# Patient Record
Sex: Male | Born: 1985 | Race: Black or African American | Hispanic: No | Marital: Single | Smoking: Current some day smoker
Health system: Southern US, Community
[De-identification: ages and names within clinical notes are randomized; demographics above are authoritative.]

## PROBLEM LIST (undated history)

## (undated) DIAGNOSIS — E119 Type 2 diabetes mellitus without complications: Secondary | ICD-10-CM

## (undated) DIAGNOSIS — I10 Essential (primary) hypertension: Secondary | ICD-10-CM

## (undated) DIAGNOSIS — E785 Hyperlipidemia, unspecified: Secondary | ICD-10-CM

## (undated) HISTORY — PX: HAND SURGERY: SHX662

## (undated) HISTORY — DX: Type 2 diabetes mellitus without complications: E11.9

## (undated) HISTORY — DX: Essential (primary) hypertension: I10

---

## 2005-08-20 ENCOUNTER — Emergency Department (HOSPITAL_COMMUNITY): Admission: EM | Admit: 2005-08-20 | Discharge: 2005-08-20 | Payer: Self-pay | Admitting: Emergency Medicine

## 2005-08-31 ENCOUNTER — Emergency Department (HOSPITAL_COMMUNITY): Admission: EM | Admit: 2005-08-31 | Discharge: 2005-08-31 | Payer: Self-pay | Admitting: Emergency Medicine

## 2005-09-01 ENCOUNTER — Emergency Department (HOSPITAL_COMMUNITY): Admission: EM | Admit: 2005-09-01 | Discharge: 2005-09-01 | Payer: Self-pay | Admitting: Emergency Medicine

## 2005-12-25 ENCOUNTER — Emergency Department (HOSPITAL_COMMUNITY): Admission: EM | Admit: 2005-12-25 | Discharge: 2005-12-25 | Payer: Self-pay | Admitting: Emergency Medicine

## 2006-02-11 ENCOUNTER — Emergency Department (HOSPITAL_COMMUNITY): Admission: EM | Admit: 2006-02-11 | Discharge: 2006-02-11 | Payer: Self-pay | Admitting: Emergency Medicine

## 2006-02-15 ENCOUNTER — Emergency Department (HOSPITAL_COMMUNITY): Admission: EM | Admit: 2006-02-15 | Discharge: 2006-02-16 | Payer: Self-pay | Admitting: Emergency Medicine

## 2006-02-17 ENCOUNTER — Emergency Department (HOSPITAL_COMMUNITY): Admission: EM | Admit: 2006-02-17 | Discharge: 2006-02-17 | Payer: Self-pay | Admitting: Emergency Medicine

## 2006-02-18 ENCOUNTER — Ambulatory Visit: Payer: Self-pay | Admitting: Orthopedic Surgery

## 2006-02-19 ENCOUNTER — Encounter: Payer: Self-pay | Admitting: Orthopedic Surgery

## 2006-02-19 ENCOUNTER — Ambulatory Visit (HOSPITAL_COMMUNITY): Admission: RE | Admit: 2006-02-19 | Discharge: 2006-02-19 | Payer: Self-pay | Admitting: *Deleted

## 2006-02-20 ENCOUNTER — Emergency Department (HOSPITAL_COMMUNITY): Admission: EM | Admit: 2006-02-20 | Discharge: 2006-02-20 | Payer: Self-pay | Admitting: Emergency Medicine

## 2006-03-10 ENCOUNTER — Ambulatory Visit: Payer: Self-pay | Admitting: Orthopedic Surgery

## 2006-04-02 ENCOUNTER — Ambulatory Visit: Payer: Self-pay | Admitting: Orthopedic Surgery

## 2006-04-07 ENCOUNTER — Encounter (HOSPITAL_COMMUNITY): Admission: RE | Admit: 2006-04-07 | Discharge: 2006-04-26 | Payer: Self-pay | Admitting: Orthopedic Surgery

## 2006-04-29 ENCOUNTER — Encounter (HOSPITAL_COMMUNITY): Admission: RE | Admit: 2006-04-29 | Discharge: 2006-05-29 | Payer: Self-pay | Admitting: Orthopedic Surgery

## 2006-07-22 ENCOUNTER — Emergency Department (HOSPITAL_COMMUNITY): Admission: EM | Admit: 2006-07-22 | Discharge: 2006-07-22 | Payer: Self-pay | Admitting: Emergency Medicine

## 2006-07-27 ENCOUNTER — Emergency Department (HOSPITAL_COMMUNITY): Admission: EM | Admit: 2006-07-27 | Discharge: 2006-07-27 | Payer: Self-pay | Admitting: Emergency Medicine

## 2006-08-04 ENCOUNTER — Ambulatory Visit: Payer: Self-pay | Admitting: Orthopedic Surgery

## 2006-08-04 ENCOUNTER — Emergency Department (HOSPITAL_COMMUNITY): Admission: EM | Admit: 2006-08-04 | Discharge: 2006-08-04 | Payer: Self-pay | Admitting: Emergency Medicine

## 2006-08-13 ENCOUNTER — Emergency Department (HOSPITAL_COMMUNITY): Admission: EM | Admit: 2006-08-13 | Discharge: 2006-08-13 | Payer: Self-pay | Admitting: Emergency Medicine

## 2008-05-04 ENCOUNTER — Emergency Department (HOSPITAL_COMMUNITY): Admission: EM | Admit: 2008-05-04 | Discharge: 2008-05-04 | Payer: Self-pay | Admitting: Internal Medicine

## 2008-06-04 ENCOUNTER — Emergency Department (HOSPITAL_COMMUNITY): Admission: EM | Admit: 2008-06-04 | Discharge: 2008-06-04 | Payer: Self-pay | Admitting: Emergency Medicine

## 2008-06-05 ENCOUNTER — Emergency Department (HOSPITAL_COMMUNITY): Admission: EM | Admit: 2008-06-05 | Discharge: 2008-06-05 | Payer: Self-pay | Admitting: Emergency Medicine

## 2008-06-21 ENCOUNTER — Emergency Department (HOSPITAL_COMMUNITY): Admission: EM | Admit: 2008-06-21 | Discharge: 2008-06-21 | Payer: Self-pay | Admitting: Emergency Medicine

## 2008-06-26 ENCOUNTER — Emergency Department (HOSPITAL_COMMUNITY): Admission: EM | Admit: 2008-06-26 | Discharge: 2008-06-26 | Payer: Self-pay | Admitting: Emergency Medicine

## 2008-08-17 ENCOUNTER — Emergency Department (HOSPITAL_COMMUNITY): Admission: EM | Admit: 2008-08-17 | Discharge: 2008-08-17 | Payer: Self-pay | Admitting: Emergency Medicine

## 2011-04-30 LAB — COMPREHENSIVE METABOLIC PANEL
ALT: 15
Albumin: 4.1
Alkaline Phosphatase: 42
Calcium: 8.9
GFR calc Af Amer: >60
GFR calc non Af Amer: >60
Glucose, Bld: 98
Sodium: 138
Total Bilirubin: 0.6
Total Protein: 6.8

## 2011-04-30 LAB — DIFFERENTIAL
Basophils Absolute: 0
Eosinophils Relative: 1
Lymphocytes Relative: 39
Neutro Abs: 2.6
Neutrophils Relative %: 53

## 2011-04-30 LAB — CBC
HCT: 46.3
MCHC: 33.6
MCV: 89.1
Platelets: 174
RBC: 5.19
WBC: 5

## 2011-04-30 LAB — CK TOTAL AND CKMB (NOT AT ARMC)
CK, MB: 1.3
Relative Index: 0.5
Total CK: 270 — ABNORMAL HIGH

## 2011-04-30 LAB — SALICYLATE LEVEL: Salicylate Lvl: <4

## 2011-04-30 LAB — ETHANOL: Alcohol, Ethyl (B): 189 — ABNORMAL HIGH

## 2011-04-30 LAB — RAPID URINE DRUG SCREEN, HOSP PERFORMED
Barbiturates: NOT DETECTED
Opiates: NOT DETECTED
Tetrahydrocannabinol: NOT DETECTED

## 2011-04-30 LAB — TROPONIN I: Troponin I: 0.01

## 2011-04-30 LAB — ACETAMINOPHEN LEVEL: Acetaminophen (Tylenol), Serum: <10 — ABNORMAL LOW

## 2018-12-15 ENCOUNTER — Emergency Department (HOSPITAL_COMMUNITY)
Admission: EM | Admit: 2018-12-15 | Discharge: 2018-12-15 | Disposition: A | Discharge status: Home / Self Care | Payer: Medicaid Other | Attending: Emergency Medicine | Admitting: Emergency Medicine

## 2018-12-15 ENCOUNTER — Other Ambulatory Visit: Payer: Self-pay

## 2018-12-15 ENCOUNTER — Encounter (HOSPITAL_COMMUNITY): Payer: Self-pay

## 2018-12-15 DIAGNOSIS — Z794 Long term (current) use of insulin: Secondary | ICD-10-CM | POA: Insufficient documentation

## 2018-12-15 DIAGNOSIS — E1165 Type 2 diabetes mellitus with hyperglycemia: Secondary | ICD-10-CM | POA: Insufficient documentation

## 2018-12-15 DIAGNOSIS — Z87891 Personal history of nicotine dependence: Secondary | ICD-10-CM | POA: Insufficient documentation

## 2018-12-15 DIAGNOSIS — R739 Hyperglycemia, unspecified: Secondary | ICD-10-CM

## 2018-12-15 HISTORY — DX: Type 2 diabetes mellitus without complications: E11.9

## 2018-12-15 LAB — CBC
HCT: 51.9 % (ref 39.0–52.0)
Hemoglobin: 16.6 g/dL (ref 13.0–17.0)
MCH: 28.5 pg (ref 26.0–34.0)
MCHC: 32 g/dL (ref 30.0–36.0)
MCV: 89.2 fL (ref 80.0–100.0)
Platelets: 251 10*3/uL (ref 150–400)
RBC: 5.82 MIL/uL — ABNORMAL HIGH (ref 4.22–5.81)
RDW: 14.1 % (ref 11.5–15.5)
WBC: 5 10*3/uL (ref 4.0–10.5)
nRBC: 0 % (ref 0.0–0.2)

## 2018-12-15 LAB — BASIC METABOLIC PANEL
Anion gap: 13 (ref 5–15)
BUN: 15 mg/dL (ref 6–20)
CO2: 24 mmol/L (ref 22–32)
Calcium: 9.2 mg/dL (ref 8.9–10.3)
Chloride: 95 mmol/L — ABNORMAL LOW (ref 98–111)
Creatinine, Ser: 1.34 mg/dL — ABNORMAL HIGH (ref 0.61–1.24)
GFR calc Af Amer: >60 mL/min (ref >60)
GFR calc non Af Amer: >60 mL/min (ref >60)
Glucose, Bld: 323 mg/dL — ABNORMAL HIGH (ref 70–99)
Potassium: 4.4 mmol/L (ref 3.5–5.1)
Sodium: 132 mmol/L — ABNORMAL LOW (ref 135–145)

## 2018-12-15 LAB — URINALYSIS, ROUTINE W REFLEX MICROSCOPIC
Glucose, UA: >=500 mg/dL — AB
Ketones, ur: 80 mg/dL — AB
Leukocytes,Ua: NEGATIVE
Nitrite: NEGATIVE
Protein, ur: NEGATIVE mg/dL
Specific Gravity, Urine: 1.033 — ABNORMAL HIGH (ref 1.005–1.030)
pH: 5 (ref 5.0–8.0)

## 2018-12-15 LAB — CBG MONITORING, ED
Glucose-Capillary: 326 mg/dL — ABNORMAL HIGH (ref 70–99)
Glucose-Capillary: 260 mg/dL — ABNORMAL HIGH (ref 70–99)

## 2018-12-15 MED ORDER — ONDANSETRON HCL 4 MG/2ML IJ SOLN
4.0000 mg | Freq: Once | INTRAMUSCULAR | Status: AC
  Administered 2018-12-15: 4 mg via INTRAVENOUS
  Filled 2018-12-15: qty 2

## 2018-12-15 MED ORDER — INSULIN ASPART 100 UNIT/ML FLEXPEN: 15.0000 [IU] | PEN_INJECTOR | Freq: Three times a day (TID) | SUBCUTANEOUS | 2 refills | Status: DC

## 2018-12-15 MED ORDER — INSULIN GLARGINE 100 UNIT/ML SOLOSTAR PEN: 35.0000 [IU] | PEN_INJECTOR | Freq: Every day | SUBCUTANEOUS | 2 refills | Status: DC

## 2018-12-15 MED ORDER — SODIUM CHLORIDE 0.9 % IV BOLUS (SEPSIS)
1000.0000 mL | Freq: Once | INTRAVENOUS | Status: AC
  Administered 2018-12-15: 1000 mL via INTRAVENOUS

## 2018-12-15 MED ORDER — INSULIN ASPART 100 UNIT/ML ~~LOC~~ SOLN
10.0000 [IU] | Freq: Once | SUBCUTANEOUS | Status: AC
  Administered 2018-12-15: 10 [IU] via SUBCUTANEOUS
  Filled 2018-12-15: qty 1

## 2018-12-15 MED ORDER — SODIUM CHLORIDE 0.9 % IV SOLN: 1000.0000 mL | INTRAVENOUS | Status: DC

## 2018-12-15 NOTE — Discharge Instructions (Signed)
Social work will call you tomorrow.

## 2018-12-15 NOTE — ED Provider Notes (Signed)
[facility] EMERGENCY DEPARTMENT Provider Note   CSN: [id number] Arrival date & time: 12/15/18  1910    History   Chief Complaint Chief Complaint  Patient presents with  . Hyperglycemia    HPI [name] [name] is a 33 y.o. male.     The history is provided by the patient. No language interpreter was used.  Hyperglycemia  Blood sugar level PTA:  500 Severity:  Severe Onset quality:  Gradual Timing:  Constant Progression:  Worsening Chronicity:  New Diabetes status:  Controlled with insulin Relieved by:  None tried Ineffective treatments:  Insulin Associated symptoms: nausea   Risk factors: hx of DKA     Past Medical History:  Diagnosis Date  . Diabetes mellitus without complication (HCC)     There are no active problems to display for this patient.   Past Surgical History:  Procedure Laterality Date  . HAND SURGERY          Home Medications    Prior to Admission medications   Medication Sig Start Date End Date Taking? Authorizing Provider  insulin aspart (NOVOLOG) 100 UNIT/ML FlexPen Inject 15-25 Units into the skin 3 (three) times daily. 25 units for blood sugar levels over 200. 15 units for most doses   Yes [provider]  Insulin Glargine (LANTUS SOLOSTAR) 100 UNIT/ML Solostar Pen Inject 35 Units into the skin at bedtime.   Yes [provider]    Family History No family history on file.  Social History Social History   Tobacco Use  . Smoking status: Former Smoker    Packs/day: 0.50    Years: 10.00    Pack years: 5.00    Types: Cigarettes    Last attempt to quit: 10/13/2018    Years since quitting: 0.1  . Smokeless tobacco: Former Engineer, water Use Topics  . Alcohol use: Not on file    Comment: quit  . Drug use: Never     Allergies   Patient has no known allergies.   Review of Systems Review of Systems  Gastrointestinal: Positive for nausea.  All other systems reviewed and are negative.    Physical Exam  Updated Vital Signs BP (!) 148/98 (BP Location: Right Arm)   Pulse (!) 110   Temp 98.2 F (36.8 C) (Oral)   Resp 20   Ht 5\' 11"  (1.803 m)   Wt 127 kg   SpO2 100%   BMI 39.05 kg/m   Physical Exam Vitals signs and nursing note reviewed.  Constitutional:      Appearance: He is well-developed.  HENT:     Head: Normocephalic and atraumatic.     Mouth/Throat:     Mouth: Mucous membranes are moist.  Eyes:     Conjunctiva/sclera: Conjunctivae normal.  Neck:     Musculoskeletal: Neck supple.  Cardiovascular:     Rate and Rhythm: Normal rate and regular rhythm.     Heart sounds: No murmur.  Pulmonary:     Effort: Pulmonary effort is normal. No respiratory distress.     Breath sounds: Normal breath sounds.  Abdominal:     General: Abdomen is flat.     Palpations: Abdomen is soft.     Tenderness: There is no abdominal tenderness.  Skin:    General: Skin is warm and dry.  Neurological:     General: No focal deficit present.     Mental Status: He is alert.  Psychiatric:        Mood and Affect: Mood normal.  ED Treatments / Results  Labs (all labs ordered are listed, but only abnormal results are displayed) Labs Reviewed  BASIC METABOLIC PANEL - Abnormal; Notable for the following components:      Result Value   Sodium 132 (*)    Chloride 95 (*)    Glucose, Bld 323 (*)    Creatinine, Ser 1.34 (*)    All other components within normal limits  CBC - Abnormal; Notable for the following components:   RBC 5.82 (*)    All other components within normal limits  URINALYSIS, ROUTINE W REFLEX MICROSCOPIC - Abnormal; Notable for the following components:   Color, Urine AMBER (*)    Specific Gravity, Urine 1.033 (*)    Glucose, UA >=500 (*)    Hgb urine dipstick SMALL (*)    Bilirubin Urine SMALL (*)    Ketones, ur 80 (*)    Bacteria, UA RARE (*)    All other components within normal limits  CBG MONITORING, ED - Abnormal; Notable for the following components:    Glucose-Capillary 326 (*)    All other components within normal limits  CBG MONITORING, ED - Abnormal; Notable for the following components:   Glucose-Capillary 260 (*)    All other components within normal limits  CBG MONITORING, ED    EKG None  Radiology No results found.  Procedures Procedures (including critical care time)  Medications Ordered in ED Medications  sodium chloride 0.9 % bolus 1,000 mL (1,000 mLs Intravenous New Bag/Given 12/15/18 2112)    Followed by  sodium chloride 0.9 % bolus 1,000 mL (1,000 mLs Intravenous New Bag/Given 12/15/18 2150)    Followed by  0.9 %  sodium chloride infusion (has no administration in time range)  insulin aspart (novoLOG) injection 10 Units (10 Units Subcutaneous Given 12/15/18 2149)     Initial Impression / Assessment and Plan / ED Course  I have reviewed the triage vital signs and the nursing notes.  Pertinent labs & imaging results that were available during my care of the patient were reviewed by me and considered in my medical decision making (see chart for details).        MDM  Pt given iv fluids x 2 liters.  Insulin 10 units subq.   Labs reviewed.  No sign of dka.   Pt states he has no diabetic supplies and no insulin.  Social work consult ordered.    Final Clinical Impressions(s) / ED Diagnoses   Final diagnoses:  Hyperglycemia    ED Discharge Orders         Ordered    insulin aspart (NOVOLOG) 100 UNIT/ML FlexPen  3 times daily     12/15/18 2317    Insulin Glargine (LANTUS SOLOSTAR) 100 UNIT/ML Solostar Pen  Daily at bedtime     12/15/18 2317        An After Visit Summary was printed and given to the patient.    Elson AreasSofia, Kaeden Mester K, PA-C 12/15/18 2317    Mancel BaleWentz, Elliott, MD 12/16/18 [phone]

## 2018-12-15 NOTE — ED Triage Notes (Addendum)
Pt reports vomiting x 3days. Pt reports frequent urination. Pt is diabetic and has no insurance right now. Pt has been using fast acting insulin only as he cannot obtain his long acting insulin (pt says he just moved down here). Pt CBG is 326 in triage. Pt moved here  from [place] two weeks ago- reports he had the covid-19 test and was negative.

## 2018-12-15 NOTE — ED Notes (Signed)
Pt states that social work needs to call [phone] tomorrow. Pt states he does not have a working phone.

## 2018-12-16 NOTE — Care Management (Addendum)
CM received call from pt regarding medication assistance. Pt given MATCH voucher and referred to [facility]. He had medicaid in [place], recently moved to [facility] and no longer has coverage.   Update: received call from pt's mom. [facility] did not carry prescribed insulin. CM called [employer] and they will request Rx from [facility] and call pt once ready for pick up.

## 2018-12-17 NOTE — Care Management (Addendum)
12/17/18:  Called BIN # and MRN number to [place] for [employer].   Patient was prescribed Lantus and Novolog pens which has to filled at a [facility]. Call to [facility], [facility] voucher info given along with patient info. They will call [facility] and have prescriptions sent to them. They will call patient when ready today.   CM called patient, no answer, VM full, unable to leave message. Left VM with mother (home phone listed on chart).

## 2018-12-22 ENCOUNTER — Emergency Department (HOSPITAL_COMMUNITY)
Admission: EM | Admit: 2018-12-22 | Discharge: 2018-12-22 | Discharge status: Absent without leave | Payer: Medicaid Other

## 2018-12-22 ENCOUNTER — Other Ambulatory Visit: Payer: Self-pay

## 2018-12-22 MED FILL — NOVOLOG FLEXPEN SYRINGE: 100 | 25 days supply | Qty: 15 | Fill #0

## 2019-05-09 ENCOUNTER — Encounter (HOSPITAL_COMMUNITY): Payer: Self-pay | Admitting: Emergency Medicine

## 2019-05-09 ENCOUNTER — Emergency Department (HOSPITAL_COMMUNITY)
Admission: EM | Admit: 2019-05-09 | Discharge: 2019-05-09 | Disposition: A | Discharge status: Home / Self Care | Payer: Self-pay | Attending: Emergency Medicine | Admitting: Emergency Medicine

## 2019-05-09 ENCOUNTER — Other Ambulatory Visit: Payer: Self-pay

## 2019-05-09 DIAGNOSIS — Z5321 Procedure and treatment not carried out due to patient leaving prior to being seen by health care provider: Secondary | ICD-10-CM | POA: Insufficient documentation

## 2019-05-09 DIAGNOSIS — R739 Hyperglycemia, unspecified: Secondary | ICD-10-CM | POA: Insufficient documentation

## 2019-05-09 DIAGNOSIS — Z794 Long term (current) use of insulin: Secondary | ICD-10-CM | POA: Insufficient documentation

## 2019-05-09 DIAGNOSIS — E1165 Type 2 diabetes mellitus with hyperglycemia: Secondary | ICD-10-CM | POA: Insufficient documentation

## 2019-05-09 DIAGNOSIS — F17210 Nicotine dependence, cigarettes, uncomplicated: Secondary | ICD-10-CM | POA: Insufficient documentation

## 2019-05-09 LAB — CBG MONITORING, ED: Glucose-Capillary: 353 mg/dL — ABNORMAL HIGH (ref 70–99)

## 2019-05-09 NOTE — ED Notes (Signed)
Pt reports he is leaving AMA  States he can adjust sugar at home "if it doesn't come down"

## 2019-05-09 NOTE — ED Triage Notes (Signed)
Pt states he just got out of jail and has been without his medications for quite some time. Feels his glucose has been high.

## 2019-05-10 ENCOUNTER — Emergency Department (HOSPITAL_COMMUNITY)
Admission: EM | Admit: 2019-05-10 | Discharge: 2019-05-10 | Disposition: A | Discharge status: Home / Self Care | Payer: Self-pay | Attending: Emergency Medicine | Admitting: Emergency Medicine

## 2019-05-10 ENCOUNTER — Encounter (HOSPITAL_COMMUNITY): Payer: Self-pay | Admitting: *Deleted

## 2019-05-10 DIAGNOSIS — R739 Hyperglycemia, unspecified: Secondary | ICD-10-CM

## 2019-05-10 LAB — BASIC METABOLIC PANEL
Anion gap: 11 (ref 5–15)
BUN: 11 mg/dL (ref 6–20)
CO2: 25 mmol/L (ref 22–32)
Calcium: 8.8 mg/dL — ABNORMAL LOW (ref 8.9–10.3)
Chloride: 94 mmol/L — ABNORMAL LOW (ref 98–111)
Creatinine, Ser: 1.24 mg/dL (ref 0.61–1.24)
GFR calc Af Amer: >60 mL/min (ref >60)
GFR calc non Af Amer: >60 mL/min (ref >60)
Glucose, Bld: 592 mg/dL (ref 70–99)
Potassium: 4.4 mmol/L (ref 3.5–5.1)
Sodium: 130 mmol/L — ABNORMAL LOW (ref 135–145)

## 2019-05-10 LAB — URINALYSIS, ROUTINE W REFLEX MICROSCOPIC
Bacteria, UA: NONE SEEN
Bilirubin Urine: NEGATIVE
Glucose, UA: >=500 mg/dL — AB
Hgb urine dipstick: NEGATIVE
Ketones, ur: NEGATIVE mg/dL
Leukocytes,Ua: NEGATIVE
Nitrite: NEGATIVE
Protein, ur: NEGATIVE mg/dL
Specific Gravity, Urine: 1.023 (ref 1.005–1.030)
pH: 6 (ref 5.0–8.0)

## 2019-05-10 LAB — CBC
HCT: 46.1 % (ref 39.0–52.0)
Hemoglobin: 15.2 g/dL (ref 13.0–17.0)
MCH: 29.1 pg (ref 26.0–34.0)
MCHC: 33 g/dL (ref 30.0–36.0)
MCV: 88.3 fL (ref 80.0–100.0)
Platelets: 162 10*3/uL (ref 150–400)
RBC: 5.22 MIL/uL (ref 4.22–5.81)
RDW: 12 % (ref 11.5–15.5)
WBC: 3.3 10*3/uL — ABNORMAL LOW (ref 4.0–10.5)
nRBC: 0 % (ref 0.0–0.2)

## 2019-05-10 LAB — CBG MONITORING, ED
Glucose-Capillary: 288 mg/dL — ABNORMAL HIGH (ref 70–99)
Glucose-Capillary: 416 mg/dL — ABNORMAL HIGH (ref 70–99)
Glucose-Capillary: 457 mg/dL — ABNORMAL HIGH (ref 70–99)

## 2019-05-10 MED ORDER — SODIUM CHLORIDE 0.9 % IV BOLUS
1000.0000 mL | Freq: Once | INTRAVENOUS | Status: AC
  Administered 2019-05-10: 1000 mL via INTRAVENOUS

## 2019-05-10 MED ORDER — SODIUM CHLORIDE 0.9 % IV BOLUS
1000.0000 mL | Freq: Once | INTRAVENOUS | Status: AC
  Administered 2019-05-10: 03:00:00 1000 mL via INTRAVENOUS

## 2019-05-10 MED ORDER — INSULIN ASPART 100 UNIT/ML FLEXPEN: 15.0000 [IU] | PEN_INJECTOR | Freq: Three times a day (TID) | SUBCUTANEOUS | 2 refills | Status: DC

## 2019-05-10 MED ORDER — INSULIN ASPART 100 UNIT/ML ~~LOC~~ SOLN
4.0000 [IU] | Freq: Once | SUBCUTANEOUS | Status: AC
  Administered 2019-05-10: 04:00:00 4 [IU] via INTRAVENOUS
  Filled 2019-05-10: qty 1

## 2019-05-10 MED ORDER — INSULIN ASPART 100 UNIT/ML ~~LOC~~ SOLN
7.0000 [IU] | Freq: Once | SUBCUTANEOUS | Status: AC
  Administered 2019-05-10: 07:00:00 7 [IU] via INTRAVENOUS
  Filled 2019-05-10: qty 1

## 2019-05-10 MED ORDER — LANTUS SOLOSTAR 100 UNIT/ML ~~LOC~~ SOPN: 35.0000 [IU] | PEN_INJECTOR | Freq: Every day | SUBCUTANEOUS | 2 refills | Status: DC

## 2019-05-10 NOTE — Discharge Instructions (Signed)
You need to find a local doctor to help you keep up with your insulin.  Please look at the diabetic diet and try to follow it.

## 2019-05-10 NOTE — ED Notes (Signed)
Date and time results received: 05/10/19 4:22 AM (use smartphrase ".now" to insert current time)  Test: glucose Critical Value: [place]  Name of Provider Notified: Dr Tomi Bamberger  Orders Received? Or Actions Taken?: see chart

## 2019-05-10 NOTE — ED Provider Notes (Signed)
[facility] EMERGENCY DEPARTMENT Provider Note   CSN: [id number] Arrival date & time: 05/09/19  2344   Time seen 2:55 AM History   Chief Complaint Chief Complaint  Patient presents with  . Hyperglycemia    HPI [name] [name] is a 33 y.o. male.     HPI patient states he has has had diabetes since he was 4 and he has always been on insulin.  He states he moved here from [place] about 7 months ago and has never gotten a Environmental manager.  He states he also was in jail and was getting insulin while in jail however he was released 4 weeks ago.  He states as soon as he was released he started having abdominal pain for the first 2 weeks he got out however the pain is been gone for 1 to 2 weeks.  He had chest pain a few days ago.  He denies shortness of breath or weight loss.  He states he has had nausea and vomiting about 3 times a day for the past few weeks and diarrhea about 3 times a day for about a week.  He states he was on a NovoLog pen and took 35 units once a day and then Lantus 25 units with each meal.  He states when he was in jail they gave him insulin 70/30 and he states it did not control his diabetes well.  PCP Patient, No Pcp Per   Past Medical History:  Diagnosis Date  . Diabetes mellitus without complication ([place])     There are no active problems to display for this patient.   Past Surgical History:  Procedure Laterality Date  . HAND SURGERY          Home Medications    Prior to Admission medications   Medication Sig Start Date End Date Taking? Authorizing Provider  insulin aspart (NOVOLOG) 100 UNIT/ML FlexPen Inject 15-25 Units into the skin 3 (three) times daily. 25 units for blood sugar levels over 200. 15 units for most doses 05/10/19   Rolland Porter, MD  Insulin Glargine (LANTUS SOLOSTAR) 100 UNIT/ML Solostar Pen Inject 35 Units into the skin at bedtime. 05/10/19   Rolland Porter, MD    Family History No family history on file.  Social History Social  History   Tobacco Use  . Smoking status: Current Every Day Smoker    Packs/day: 0.50    Years: 10.00    Pack years: 5.00    Types: Cigarettes  . Smokeless tobacco: Former Network engineer Use Topics  . Alcohol use: Yes  . Drug use: Never  unemployed   Allergies   Patient has no known allergies.   Review of Systems Review of Systems  All other systems reviewed and are negative.    Physical Exam Updated Vital Signs BP (!) 147/98   Pulse 70   Temp 97.6 F (36.4 C) (Oral)   Resp 18   Ht 6' (1.829 m)   Wt 128 kg   SpO2 100%   BMI 38.27 kg/m   Physical Exam Vitals signs and nursing note reviewed.  Constitutional:      Appearance: Normal appearance.  HENT:     Head: Normocephalic and atraumatic.     Right Ear: External ear normal.     Left Ear: External ear normal.     Mouth/Throat:     Mouth: Mucous membranes are dry.     Pharynx: No oropharyngeal exudate or posterior oropharyngeal erythema.  Eyes:  Extraocular Movements: Extraocular movements intact.     Conjunctiva/sclera: Conjunctivae normal.  Neck:     Musculoskeletal: Normal range of motion.  Cardiovascular:     Rate and Rhythm: Normal rate and regular rhythm.     Pulses: Normal pulses.  Pulmonary:     Effort: Pulmonary effort is normal.     Breath sounds: Normal breath sounds. No wheezing.  Abdominal:     General: Abdomen is flat.     Palpations: Abdomen is soft.     Tenderness: There is no abdominal tenderness.  Musculoskeletal: Normal range of motion.  Skin:    General: Skin is warm and dry.  Neurological:     General: No focal deficit present.     Mental Status: He is alert and oriented to person, place, and time.     Cranial Nerves: No cranial nerve deficit.  Psychiatric:        Mood and Affect: Mood normal.        Behavior: Behavior normal.        Thought Content: Thought content normal.      ED Treatments / Results  Labs (all labs ordered are listed, but only abnormal results  are displayed) Results for orders placed or performed during the hospital encounter of 05/10/19  Basic metabolic panel  Result Value Ref Range   Sodium 130 (L) 135 - 145 mmol/L   Potassium 4.4 3.5 - 5.1 mmol/L   Chloride 94 (L) 98 - 111 mmol/L   CO2 25 22 - 32 mmol/L   Glucose, Bld 592 (HH) 70 - 99 mg/dL   BUN 11 6 - 20 mg/dL   Creatinine, Ser 1.76 0.61 - 1.24 mg/dL   Calcium 8.8 (L) 8.9 - 10.3 mg/dL   GFR calc non Af Amer >60 >60 mL/min   GFR calc Af Amer >60 >60 mL/min   Anion gap 11 5 - 15  CBC  Result Value Ref Range   WBC 3.3 (L) 4.0 - 10.5 K/uL   RBC 5.22 4.22 - 5.81 MIL/uL   Hemoglobin 15.2 13.0 - 17.0 g/dL   HCT 16.0 73.7 - 10.6 %   MCV 88.3 80.0 - 100.0 fL   MCH 29.1 26.0 - 34.0 pg   MCHC 33.0 30.0 - 36.0 g/dL   RDW 26.9 48.5 - 46.2 %   Platelets 162 150 - 400 K/uL   nRBC 0.0 0.0 - 0.2 %  Urinalysis, Routine w reflex microscopic  Result Value Ref Range   Color, Urine STRAW (A) YELLOW   APPearance CLEAR CLEAR   Specific Gravity, Urine 1.023 1.005 - 1.030   pH 6.0 5.0 - 8.0   Glucose, UA >=500 (A) NEGATIVE mg/dL   Hgb urine dipstick NEGATIVE NEGATIVE   Bilirubin Urine NEGATIVE NEGATIVE   Ketones, ur NEGATIVE NEGATIVE mg/dL   Protein, ur NEGATIVE NEGATIVE mg/dL   Nitrite NEGATIVE NEGATIVE   Leukocytes,Ua NEGATIVE NEGATIVE   RBC / HPF 0-5 0 - 5 RBC/hpf   WBC, UA 0-5 0 - 5 WBC/hpf   Bacteria, UA NONE SEEN NONE SEEN   Squamous Epithelial / LPF 0-5 0 - 5  CBG monitoring, ED  Result Value Ref Range   Glucose-Capillary 457 (H) 70 - 99 mg/dL  CBG monitoring, ED  Result Value Ref Range   Glucose-Capillary 416 (H) 70 - 99 mg/dL  CBG monitoring, ED  Result Value Ref Range   Glucose-Capillary 288 (H) 70 - 99 mg/dL   Laboratory interpretation all normal except hyperglycemia without metabolic acidosis  EKG None  Radiology No results found.  Procedures Procedures (including critical care time)  Medications Ordered in ED Medications  sodium chloride 0.9 %  bolus 1,000 mL (0 mLs Intravenous Stopped 05/10/19 0437)  sodium chloride 0.9 % bolus 1,000 mL (0 mLs Intravenous Stopped 05/10/19 0437)  insulin aspart (novoLOG) injection 4 Units (4 Units Intravenous Given 05/10/19 0353)  insulin aspart (novoLOG) injection 7 Units (7 Units Intravenous Given 05/10/19 [id number])     Initial Impression / Assessment and Plan / ED Course  I have reviewed the triage vital signs and the nursing notes.  Pertinent labs & imaging results that were available during my care of the patient were reviewed by me and considered in my medical decision making (see chart for details).    Patient was given IV fluids, he was given insulin 4 units NovoLog IV.  When his CBG was rechecked after the IV fluids and insulin it was still 416.  He was given an additional 7 units of insulin.   7:55 AM patient's CBG is down to 288.  He was discharged home.  He was given information on to get a local doctor, social services to help him with his financial situation.   Final Clinical Impressions(s) / ED Diagnoses   Final diagnoses:  Hyperglycemia    ED Discharge Orders         Ordered    insulin aspart (NOVOLOG) 100 UNIT/ML FlexPen  3 times daily     05/10/19 0718    Insulin Glargine (LANTUS SOLOSTAR) 100 UNIT/ML Solostar Pen  Daily at bedtime     05/10/19 [id number]          Plan discharge  Devoria AlbeIva Kellyjo Edgren, MD, Concha PyoFACEP    Tredarius Cobern, MD 05/10/19 [phone]

## 2019-05-10 NOTE — ED Triage Notes (Signed)
Pt c/o elevated blood sugar, states " I have been feeling bad"

## 2019-05-29 ENCOUNTER — Other Ambulatory Visit: Payer: Self-pay

## 2019-05-29 ENCOUNTER — Encounter (HOSPITAL_COMMUNITY): Payer: Self-pay | Admitting: Emergency Medicine

## 2019-05-29 DIAGNOSIS — Z794 Long term (current) use of insulin: Secondary | ICD-10-CM | POA: Insufficient documentation

## 2019-05-29 DIAGNOSIS — R319 Hematuria, unspecified: Secondary | ICD-10-CM | POA: Insufficient documentation

## 2019-05-29 DIAGNOSIS — E119 Type 2 diabetes mellitus without complications: Secondary | ICD-10-CM | POA: Insufficient documentation

## 2019-05-29 DIAGNOSIS — F17210 Nicotine dependence, cigarettes, uncomplicated: Secondary | ICD-10-CM | POA: Insufficient documentation

## 2019-05-29 NOTE — ED Triage Notes (Signed)
Pt c/o hematuria and back pain that started 5 days ago.

## 2019-05-30 ENCOUNTER — Emergency Department (HOSPITAL_COMMUNITY): Payer: Self-pay

## 2019-05-30 ENCOUNTER — Emergency Department (HOSPITAL_COMMUNITY)
Admission: EM | Admit: 2019-05-30 | Discharge: 2019-05-30 | Disposition: A | Discharge status: Home / Self Care | Payer: Self-pay | Attending: Emergency Medicine | Admitting: Emergency Medicine

## 2019-05-30 DIAGNOSIS — R319 Hematuria, unspecified: Secondary | ICD-10-CM

## 2019-05-30 LAB — URINALYSIS, ROUTINE W REFLEX MICROSCOPIC
Bacteria, UA: NONE SEEN
Bilirubin Urine: NEGATIVE
Glucose, UA: >=500 mg/dL — AB
Hgb urine dipstick: NEGATIVE
Ketones, ur: NEGATIVE mg/dL
Leukocytes,Ua: NEGATIVE
Nitrite: NEGATIVE
Protein, ur: 30 mg/dL — AB
Specific Gravity, Urine: 1.027 (ref 1.005–1.030)
pH: 5 (ref 5.0–8.0)

## 2019-05-30 LAB — CBG MONITORING, ED: Glucose-Capillary: 220 mg/dL — ABNORMAL HIGH (ref 70–99)

## 2019-05-30 MED ORDER — KETOROLAC TROMETHAMINE 30 MG/ML IJ SOLN
30.0000 mg | Freq: Once | INTRAMUSCULAR | Status: AC
  Administered 2019-05-30: 02:00:00 30 mg via INTRAMUSCULAR

## 2019-05-30 MED ORDER — KETOROLAC TROMETHAMINE 30 MG/ML IJ SOLN
INTRAMUSCULAR | Status: AC
  Administered 2019-05-30: 30 mg via INTRAMUSCULAR
  Filled 2019-05-30: qty 1

## 2019-05-30 NOTE — ED Provider Notes (Signed)
[facility] EMERGENCY DEPARTMENT Provider Note   CSN: [id number] Arrival date & time: 05/29/19  2215   Time seen 1:22 AM History   Chief Complaint Chief Complaint  Patient presents with   Hematuria    HPI [name] [name] is a 33 y.o. male.     HPI patient states he has had blood in his urine for the past week.  He also complains of his whole lumbar spine is being painful in the midline for the past 4 to 5 days.  He states is constant and it hurts so bad he cannot fall asleep.  But he also told me it goes away at night.  He denies nausea, vomiting, fever, but he does describe dysuria, frequency, and urgency.  He states he is never had this before.  PCP Patient, No Pcp Per   Past Medical History:  Diagnosis Date   Diabetes mellitus without complication (HCC)     There are no active problems to display for this patient.   Past Surgical History:  Procedure Laterality Date   HAND SURGERY          Home Medications    Prior to Admission medications   Medication Sig Start Date End Date Taking? Authorizing Provider  insulin aspart (NOVOLOG) 100 UNIT/ML FlexPen Inject 15-25 Units into the skin 3 (three) times daily. 25 units for blood sugar levels over 200. 15 units for most doses 05/10/19   Devoria AlbeKnapp, Malea Swilling, MD  Insulin Glargine (LANTUS SOLOSTAR) 100 UNIT/ML Solostar Pen Inject 35 Units into the skin at bedtime. 05/10/19   Devoria AlbeKnapp, Juanpablo Ciresi, MD    Family History No family history on file.  Social History Social History   Tobacco Use   Smoking status: Current Every Day Smoker    Packs/day: 0.50    Years: 10.00    Pack years: 5.00    Types: Cigarettes   Smokeless tobacco: Former NeurosurgeonUser  Substance Use Topics   Alcohol use: Yes   Drug use: Never  [place]   Allergies   Patient has no known allergies.   Review of Systems Review of Systems  All other systems reviewed and are negative.    Physical Exam Updated Vital Signs BP (!)  162/114    Pulse 89    Temp 98.1 F (36.7 C)    Resp 18    Ht 5\' 11"  (1.803 m)    Wt 128 kg    SpO2 100%    BMI 39.36 kg/m   Physical Exam Vitals signs and nursing note reviewed.  Constitutional:      General: He is not in acute distress.    Appearance: Normal appearance. He is obese.     Comments: Patient is sound asleep and had to be awakened.  HENT:     Head: Normocephalic and atraumatic.     Right Ear: External ear normal.     Left Ear: External ear normal.  Eyes:     Extraocular Movements: Extraocular movements intact.     Conjunctiva/sclera: Conjunctivae normal.  Neck:     Musculoskeletal: Normal range of motion.  Cardiovascular:     Rate and Rhythm: Normal rate.  Pulmonary:     Effort: Pulmonary effort is normal. No respiratory distress.  Abdominal:     General: Abdomen is flat. Bowel sounds are normal.     Palpations: Abdomen is soft.     Tenderness: There is abdominal tenderness in the suprapubic area. There is no right CVA tenderness, left CVA  tenderness, guarding or rebound.  Musculoskeletal: Normal range of motion.     Comments: Patient states his tenderness is over his lumbar spine in the midline, I do not feel any step-offs or crepitus.  Skin:    General: Skin is warm and dry.  Neurological:     General: No focal deficit present.     Mental Status: He is alert and oriented to person, place, and time.     Cranial Nerves: No cranial nerve deficit.  Psychiatric:        Mood and Affect: Mood normal.        Behavior: Behavior normal.        Thought Content: Thought content normal.      ED Treatments / Results  Labs (all labs ordered are listed, but only abnormal results are displayed) Results for orders placed or performed during the hospital encounter of 05/30/19  Urinalysis, Routine w reflex microscopic- may I&O cath if menses  Result Value Ref Range   Color, Urine YELLOW YELLOW   APPearance HAZY (A) CLEAR   Specific Gravity, Urine 1.027 1.005 - 1.030    pH 5.0 5.0 - 8.0   Glucose, UA >=500 (A) NEGATIVE mg/dL   Hgb urine dipstick NEGATIVE NEGATIVE   Bilirubin Urine NEGATIVE NEGATIVE   Ketones, ur NEGATIVE NEGATIVE mg/dL   Protein, ur 30 (A) NEGATIVE mg/dL   Nitrite NEGATIVE NEGATIVE   Leukocytes,Ua NEGATIVE NEGATIVE   RBC / HPF 0-5 0 - 5 RBC/hpf   WBC, UA 0-5 0 - 5 WBC/hpf   Bacteria, UA NONE SEEN NONE SEEN   Squamous Epithelial / LPF 0-5 0 - 5   Mucus PRESENT   CBG monitoring, ED  Result Value Ref Range   Glucose-Capillary 220 (H) 70 - 99 mg/dL   Laboratory interpretation all normal except hyperglycemia   EKG None  Radiology Ct Renal Stone Study  Result Date: 05/30/2019 CLINICAL DATA:  33 year old male with hematuria. EXAM: CT ABDOMEN AND PELVIS WITHOUT CONTRAST TECHNIQUE: Multidetector CT imaging of the abdomen and pelvis was performed following the standard protocol without IV contrast. COMPARISON:  None. FINDINGS: Evaluation of this exam is limited in the absence of intravenous contrast. Lower chest: The visualized lung bases are clear. No intra-abdominal free air or free fluid. Hepatobiliary: The liver is unremarkable. No intrahepatic biliary ductal dilatation. Layering sludge noted within the gallbladder. No pericholecystic fluid. Pancreas: Unremarkable. No pancreatic ductal dilatation or surrounding inflammatory changes. Spleen: Normal in size without focal abnormality. Adrenals/Urinary Tract: The adrenal glands are unremarkable. The kidneys, visualized ureters, and urinary bladder appear unremarkable. Stomach/Bowel: There is no bowel obstruction or active inflammation. The appendix is normal. Vascular/Lymphatic: The abdominal aorta and IVC are grossly unremarkable on this noncontrast CT. No portal venous gas. There is no adenopathy. Reproductive: Prostate and seminal vesicles are grossly unremarkable. No pelvic mass. Other: Small fat containing umbilical hernia. Musculoskeletal: No acute or significant osseous findings. IMPRESSION:  1. No acute intra-abdominal or pelvic pathology. No hydronephrosis or nephrolithiasis. 2. No bowel obstruction or active inflammation. Normal appendix. Electronically Signed   By: Anner Crete M.D.   On: 05/30/2019 02:55    Procedures Procedures (including critical care time)  Medications Ordered in ED Medications  ketorolac (TORADOL) 30 MG/ML injection 30 mg (30 mg Intramuscular Given During Downtime 05/30/19 0223)     Initial Impression / Assessment and Plan / ED Course  I have reviewed the triage vital signs and the nursing notes.  Pertinent labs & imaging results that were available  during my care of the patient were reviewed by me and considered in my medical decision making (see chart for details).      Patient had told the nurse he thought his blood sugar was dropping, his CBG was done and it was 220.  Patient was given Toradol for his pain.  Patient CT is normal and he was discharged home.  Final Clinical Impressions(s) / ED Diagnoses   Final diagnoses:  Hematuria, unspecified type    ED Discharge Orders    None     Plan discharge  Devoria Albe, MD, Concha Pyo, MD 05/30/19 [phone]

## 2019-05-30 NOTE — ED Notes (Signed)
Pt going to restroom

## 2019-05-30 NOTE — Discharge Instructions (Addendum)
Your urinalysis that went to the lab tonight did not show any blood.  Your CT scan does not show any kidney stones.

## 2019-06-29 ENCOUNTER — Emergency Department (HOSPITAL_COMMUNITY): Payer: Self-pay

## 2019-06-29 ENCOUNTER — Encounter (HOSPITAL_COMMUNITY): Payer: Self-pay | Admitting: Emergency Medicine

## 2019-06-29 ENCOUNTER — Other Ambulatory Visit: Payer: Self-pay

## 2019-06-29 ENCOUNTER — Emergency Department (HOSPITAL_COMMUNITY)
Admission: EM | Admit: 2019-06-29 | Discharge: 2019-06-29 | Disposition: A | Discharge status: Home / Self Care | Payer: Self-pay | Attending: Emergency Medicine | Admitting: Emergency Medicine

## 2019-06-29 DIAGNOSIS — M545 Low back pain, unspecified: Secondary | ICD-10-CM

## 2019-06-29 DIAGNOSIS — E119 Type 2 diabetes mellitus without complications: Secondary | ICD-10-CM | POA: Insufficient documentation

## 2019-06-29 DIAGNOSIS — E669 Obesity, unspecified: Secondary | ICD-10-CM | POA: Insufficient documentation

## 2019-06-29 DIAGNOSIS — Z6836 Body mass index (BMI) 36.0-36.9, adult: Secondary | ICD-10-CM | POA: Insufficient documentation

## 2019-06-29 DIAGNOSIS — Z794 Long term (current) use of insulin: Secondary | ICD-10-CM | POA: Insufficient documentation

## 2019-06-29 DIAGNOSIS — F17210 Nicotine dependence, cigarettes, uncomplicated: Secondary | ICD-10-CM | POA: Insufficient documentation

## 2019-06-29 LAB — URINALYSIS, ROUTINE W REFLEX MICROSCOPIC
Bacteria, UA: NONE SEEN
Bilirubin Urine: NEGATIVE
Glucose, UA: >=500 mg/dL — AB
Hgb urine dipstick: NEGATIVE
Ketones, ur: NEGATIVE mg/dL
Leukocytes,Ua: NEGATIVE
Nitrite: NEGATIVE
Protein, ur: 30 mg/dL — AB
Specific Gravity, Urine: 1.028 (ref 1.005–1.030)
pH: 5 (ref 5.0–8.0)

## 2019-06-29 MED ORDER — LIDOCAINE 5 % EX PTCH
1.0000 | MEDICATED_PATCH | CUTANEOUS | Status: DC
  Administered 2019-06-29: 1 via TRANSDERMAL
  Filled 2019-06-29 (×2): qty 1

## 2019-06-29 MED ORDER — OXYCODONE-ACETAMINOPHEN 5-325 MG PO TABS
1.0000 | ORAL_TABLET | Freq: Once | ORAL | Status: AC
  Administered 2019-06-29: 1 via ORAL
  Filled 2019-06-29: qty 1

## 2019-06-29 MED ORDER — LIDOCAINE 5 % EX PTCH: 1.0000 | MEDICATED_PATCH | CUTANEOUS | 0 refills | Status: DC

## 2019-06-29 NOTE — ED Notes (Signed)
Pt verbalized understanding of d/c instructions and has no further questions, VSS, NAD.  

## 2019-06-29 NOTE — ED Provider Notes (Signed)
[name] EMERGENCY DEPARTMENT Provider Note   CSN: [id number] Arrival date & time: 06/29/19  0151     History   Chief Complaint Chief Complaint  Patient presents with  . Back Pain    HPI [name] [name] is a 33 y.o. male history of diabetes otherwise healthy.  Patient reports 1 month of bilateral lower back pain, constant throbbing sensation worsened with movement palpation no alleviating factors, no radiation of pain.  He has been using Advil intermittently without relief.  Patient reports that after his pain first began he was seen at [facility] emergency department on 05/30/2019, at that time he was concern for blood in his urine as well.  At that time urinalysis and CT renal stone study was performed.  Urinalysis showed glucose and protein otherwise within normal limits.  CT Renal Stone Study: IMPRESSION: 1. No acute intra-abdominal or pelvic pathology. No hydronephrosis or nephrolithiasis. 2. No bowel obstruction or active inflammation. Normal appendix.  Patient reports hematuria has resolved but back pain persists.  Denies fever/chills, headache/vision changes, neck pain, extremity pain/swelling, numbness/tingling, weakness, fall/injury, IV drug use, bowel/bladder incontinence, urinary retention, saddle area paresthesias, chest pain/shortness of breath, abdominal pain, nausea/vomiting, diarrhea, constipation or any additional concerns today.     HPI  Past Medical History:  Diagnosis Date  . Diabetes mellitus without complication (HCC)     There are no active problems to display for this patient.   Past Surgical History:  Procedure Laterality Date  . HAND SURGERY          Home Medications    Prior to Admission medications   Medication Sig Start Date End Date Taking? Authorizing Provider  insulin aspart (NOVOLOG) 100 UNIT/ML FlexPen Inject 15-25 Units into the skin 3 (three) times daily. 25 units for blood sugar levels over 200. 15 units for  most doses 05/10/19   Devoria Albe, MD  Insulin Glargine (LANTUS SOLOSTAR) 100 UNIT/ML Solostar Pen Inject 35 Units into the skin at bedtime. 05/10/19   Devoria Albe, MD    Family History No family history on file.  Social History Social History   Tobacco Use  . Smoking status: Current Every Day Smoker    Packs/day: 0.50    Years: 10.00    Pack years: 5.00    Types: Cigarettes  . Smokeless tobacco: Former Engineer, water Use Topics  . Alcohol use: Yes  . Drug use: Never     Allergies   Patient has no known allergies.   Review of Systems Review of Systems Ten systems are reviewed and are negative for acute change except as noted in the HPI   Physical Exam Updated Vital Signs BP (!) 147/98   Pulse (!) 112   Temp 98.3 F (36.8 C) (Oral)   Resp 16   Ht 5\' 11"  (1.803 m)   Wt 117.9 kg   SpO2 100%   BMI 36.26 kg/m   Physical Exam Constitutional:      General: He is not in acute distress.    Appearance: Normal appearance. He is well-developed. He is obese. He is not ill-appearing or diaphoretic.  HENT:     Head: Normocephalic and atraumatic.     Right Ear: External ear normal.     Left Ear: External ear normal.     Nose: Nose normal.  Eyes:     General: Vision grossly intact. Gaze aligned appropriately.     Pupils: Pupils are equal, round, and reactive to light.  Neck:  Musculoskeletal: Full passive range of motion without pain, normal range of motion and neck supple.     Trachea: Trachea and phonation normal. No tracheal deviation.  Cardiovascular:     Rate and Rhythm: Normal rate and regular rhythm.     Pulses:          Dorsalis pedis pulses are 2+ on the right side and 2+ on the left side.  Pulmonary:     Effort: Pulmonary effort is normal. No accessory muscle usage or respiratory distress.     Breath sounds: Normal air entry.  Chest:     Chest wall: No tenderness.  Abdominal:     General: There is no distension.     Palpations: Abdomen is soft.      Tenderness: There is no abdominal tenderness. There is no guarding or rebound.  Musculoskeletal: Normal range of motion.     Comments: No midline C/T/L spinal tenderness to palpation, no deformity, crepitus, or step-off noted. No sign of injury to the neck or back. - Diffuse bilateral lumbar paraspinal muscular tenderness to palpation.   Feet:     Right foot:     Protective Sensation: 3 sites tested. 3 sites sensed.     Left foot:     Protective Sensation: 3 sites tested. 3 sites sensed.  Skin:    General: Skin is warm and dry.  Neurological:     Mental Status: He is alert.     GCS: GCS eye subscore is 4. GCS verbal subscore is 5. GCS motor subscore is 6.     Comments: Speech is clear and goal oriented, follows commands Major Cranial nerves without deficit, no facial droop Moves extremities without ataxia, coordination intact DTR 2+ bilateral patella, no clonus of the feet  Psychiatric:        Behavior: Behavior normal.    ED Treatments / Results  Labs (all labs ordered are listed, but only abnormal results are displayed) Labs Reviewed  URINALYSIS, ROUTINE W REFLEX MICROSCOPIC - Abnormal; Notable for the following components:      Result Value   APPearance HAZY (*)    Glucose, UA >=500 (*)    Protein, ur 30 (*)    All other components within normal limits    EKG None  Radiology Dg Lumbar Spine Complete  Result Date: 06/29/2019 CLINICAL DATA:  Low back pain for 1 month without known injury. EXAM: LUMBAR SPINE - COMPLETE 4+ VIEW COMPARISON:  None. FINDINGS: There is no evidence of lumbar spine fracture. Alignment is normal. Intervertebral disc spaces are maintained. IMPRESSION: Negative. Electronically Signed   By: Marijo Conception M.D.   On: 06/29/2019 08:38    Procedures Procedures (including critical care time)  Medications Ordered in ED Medications  lidocaine (LIDODERM) 5 % 1 patch (1 patch Transdermal Patch Applied 06/29/19 0834)  oxyCODONE-acetaminophen  (PERCOCET/ROXICET) 5-325 MG per tablet 1 tablet (1 tablet Oral Given 06/29/19 0834)     Initial Impression / Assessment and Plan / ED Course  I have reviewed the triage vital signs and the nursing notes.  Pertinent labs & imaging results that were available during my care of the patient were reviewed by me and considered in my medical decision making (see chart for details).     DG Lumbar spine: IMPRESSION: Negative.  [name] [name] is a 33 y.o. male presenting with 1 month of bilateral lower back pain. Patient denies history of Trauma, fever, IV drug use, night sweats, weight loss, cancer, saddle anesthesia, urinary rentention,  bowel/bladder incontinence. No neurological deficits and normal neuro exam.  Suspect musculoskeletal etiology of patient's pain. Pain is consistently reproducible with palpation of the back musculature.  X-ray imaging today reassuring.  Abdomen soft/nontender and without pulsatile mass. Patient with equal pedal pulses. Doubt spinal epidural abscess, cauda equina or AAA.  Urinalysis also obtained per patient concern shows glucose and protein similar to prior, advised patient to follow-up with his PCP.  Patient was given Lidoderm patch here in the emergency department reports improvement of pain with this and is requesting more for home which will be prescribed.  Additionally patient was given 1 Percocet here in the ER for his back pain, he states understanding of narcotic precautions and reports that he has a ride home today.  Patient is ambulatory in the emergency department without assistance. RICE protocol and pain medicine indicated and discussed with patient.   At this time there does not appear to be any evidence of an acute emergency medical condition and the patient appears stable for discharge with appropriate outpatient follow up. Diagnosis was discussed with patient who verbalizes understanding of care plan and is agreeable to discharge. I have discussed return  precautions with patient who verbalizes understanding of return precautions. Patient encouraged to follow-up with their PCP. All questions answered. Patient has been discharged in good condition.  Patient's case discussed with Dr. Criss AlvineGoldston who agrees with plan to discharge with follow-up.   Note: Portions of this report may have been transcribed using voice recognition software. Every effort was made to ensure accuracy; however, inadvertent computerized transcription errors may still be present. Final Clinical Impressions(s) / ED Diagnoses   Final diagnoses:  Bilateral low back pain without sciatica, unspecified chronicity    ED Discharge Orders         Ordered    lidocaine (LIDODERM) 5 %  Every 24 hours     06/29/19 1056           Elizabeth PalauMorelli, Brihanna Devenport A, PA-C 07/06/19 1542    Pricilla LovelessGoldston, Scott, MD 07/07/19 2149

## 2019-06-29 NOTE — ED Triage Notes (Signed)
Pt c/o lower back pain X 4 days.  Reports the pain is so bad he can not sleep.Pt ambulatory in triage.

## 2019-06-29 NOTE — Discharge Instructions (Addendum)
You have been diagnosed today with bilateral lower back pain without sciatica.  At this time there does not appear to be the presence of an emergent medical condition, however there is always the potential for conditions to change. Please read and follow the below instructions.  Please return to the Emergency Department immediately for any new or worsening symptoms. Please be sure to follow up with your Primary Care Provider within one week regarding your visit today; please call their office to schedule an appointment even if you are feeling better for a follow-up visit. You have been given a pain medication today called oxycodone.  This medication may make you drowsy.  Do not drive or perform any dangerous activities for the rest of the day as it will make you tired.  Do not drink alcohol or take any other sedating medications today. You may continue using Lidoderm patches as prescribed to help with your symptoms.  These may be expensive at the pharmacy, you may find over-the-counter versions that are cheaper, speak with the pharmacist for their recommendations. Your urinalysis today showed sugar and protein in your urine, please discuss this with your primary care provider at your next visit to schedule follow-up testing.  Get help right away if: You develop new bowel or bladder control problems. You have unusual weakness or numbness in your arms or legs. You develop nausea or vomiting. You develop abdominal pain. You feel faint. Have back pain. Have diarrhea. Vomit. Have a fever. Have a rash. You have any new/concerning or worsening of symptoms  Please read the additional information packets attached to your discharge summary.  Do not take your medicine if  develop an itchy rash, swelling in your mouth or lips, or difficulty breathing; call 911 and seek immediate emergency medical attention if this occurs.  Note: Portions of this text may have been transcribed using voice recognition  software. Every effort was made to ensure accuracy; however, inadvertent computerized transcription errors may still be present.

## 2019-07-02 ENCOUNTER — Encounter (HOSPITAL_COMMUNITY): Payer: Self-pay | Admitting: Emergency Medicine

## 2019-07-02 ENCOUNTER — Inpatient Hospital Stay (HOSPITAL_COMMUNITY)
Admission: EM | Admit: 2019-07-02 | Discharge: 2019-07-06 | DRG: 639 | Disposition: A | Discharge status: Home / Self Care | Payer: Medicaid Other | Attending: Internal Medicine | Admitting: Internal Medicine

## 2019-07-02 ENCOUNTER — Other Ambulatory Visit: Payer: Self-pay

## 2019-07-02 DIAGNOSIS — F17210 Nicotine dependence, cigarettes, uncomplicated: Secondary | ICD-10-CM | POA: Diagnosis present

## 2019-07-02 DIAGNOSIS — E10649 Type 1 diabetes mellitus with hypoglycemia without coma: Principal | ICD-10-CM | POA: Diagnosis present

## 2019-07-02 DIAGNOSIS — E162 Hypoglycemia, unspecified: Secondary | ICD-10-CM

## 2019-07-02 DIAGNOSIS — Z20828 Contact with and (suspected) exposure to other viral communicable diseases: Secondary | ICD-10-CM | POA: Diagnosis present

## 2019-07-02 DIAGNOSIS — I10 Essential (primary) hypertension: Secondary | ICD-10-CM | POA: Diagnosis present

## 2019-07-02 DIAGNOSIS — Z794 Long term (current) use of insulin: Secondary | ICD-10-CM

## 2019-07-02 HISTORY — DX: Hypoglycemia, unspecified: E16.2

## 2019-07-02 LAB — CBG MONITORING, ED
Glucose-Capillary: 102 mg/dL — ABNORMAL HIGH (ref 70–99)
Glucose-Capillary: 32 mg/dL — CL (ref 70–99)
Glucose-Capillary: 36 mg/dL — CL (ref 70–99)
Glucose-Capillary: 36 mg/dL — CL (ref 70–99)
Glucose-Capillary: 37 mg/dL — CL (ref 70–99)
Glucose-Capillary: 58 mg/dL — ABNORMAL LOW (ref 70–99)
Glucose-Capillary: 71 mg/dL (ref 70–99)
Glucose-Capillary: 90 mg/dL (ref 70–99)
Glucose-Capillary: 95 mg/dL (ref 70–99)

## 2019-07-02 LAB — COMPREHENSIVE METABOLIC PANEL
ALT: 18 U/L (ref 0–44)
AST: 29 U/L (ref 15–41)
Albumin: 4 g/dL (ref 3.5–5.0)
Alkaline Phosphatase: 67 U/L (ref 38–126)
Anion gap: 11 (ref 5–15)
BUN: 25 mg/dL — ABNORMAL HIGH (ref 6–20)
CO2: 23 mmol/L (ref 22–32)
Calcium: 9.2 mg/dL (ref 8.9–10.3)
Chloride: 102 mmol/L (ref 98–111)
Creatinine, Ser: 1.17 mg/dL (ref 0.61–1.24)
GFR calc Af Amer: >60 mL/min (ref >60)
GFR calc non Af Amer: >60 mL/min (ref >60)
Glucose, Bld: 29 mg/dL — CL (ref 70–99)
Potassium: 3.8 mmol/L (ref 3.5–5.1)
Sodium: 136 mmol/L (ref 135–145)
Total Bilirubin: 0.5 mg/dL (ref 0.3–1.2)
Total Protein: 7.6 g/dL (ref 6.5–8.1)

## 2019-07-02 LAB — CBC WITH DIFFERENTIAL/PLATELET
Abs Immature Granulocytes: 0.02 10*3/uL (ref 0.00–0.07)
Basophils Absolute: 0 10*3/uL (ref 0.0–0.1)
Basophils Relative: 0 %
Eosinophils Absolute: 0.1 10*3/uL (ref 0.0–0.5)
Eosinophils Relative: 1 %
HCT: 51.4 % (ref 39.0–52.0)
Hemoglobin: 17.6 g/dL — ABNORMAL HIGH (ref 13.0–17.0)
Immature Granulocytes: 0 %
Lymphocytes Relative: 34 %
Lymphs Abs: 3.1 10*3/uL (ref 0.7–4.0)
MCH: 29.8 pg (ref 26.0–34.0)
MCHC: 34.2 g/dL (ref 30.0–36.0)
MCV: 87 fL (ref 80.0–100.0)
Monocytes Absolute: 1 10*3/uL (ref 0.1–1.0)
Monocytes Relative: 11 %
Neutro Abs: 4.9 10*3/uL (ref 1.7–7.7)
Neutrophils Relative %: 54 %
Platelets: 295 10*3/uL (ref 150–400)
RBC: 5.91 MIL/uL — ABNORMAL HIGH (ref 4.22–5.81)
RDW: 12.7 % (ref 11.5–15.5)
WBC: 9 10*3/uL (ref 4.0–10.5)
nRBC: 0 % (ref 0.0–0.2)

## 2019-07-02 LAB — HEMOGLOBIN A1C
Hgb A1c MFr Bld: 8.3 % — ABNORMAL HIGH (ref 4.8–5.6)
Mean Plasma Glucose: 191.51 mg/dL

## 2019-07-02 LAB — POC SARS CORONAVIRUS 2 AG -  ED: SARS Coronavirus 2 Ag: NEGATIVE

## 2019-07-02 MED ORDER — GLUCOSE 40 % PO GEL
ORAL | Status: AC
  Administered 2019-07-02: 21:00:00 1
  Filled 2019-07-02: qty 1

## 2019-07-02 MED ORDER — DEXTROSE 50 % IV SOLN
1.0000 | Freq: Once | INTRAVENOUS | Status: AC
  Administered 2019-07-02: 50 mL via INTRAVENOUS
  Filled 2019-07-02: qty 50

## 2019-07-02 MED ORDER — DEXTROSE-NACL 5-0.9 % IV SOLN
INTRAVENOUS | Status: DC
  Administered 2019-07-02: 21:00:00 via INTRAVENOUS

## 2019-07-02 NOTE — ED Notes (Signed)
Pt given sandwich, apple sauce, and Coke.

## 2019-07-02 NOTE — ED Provider Notes (Signed)
[name] EMERGENCY DEPARTMENT Provider Note   CSN: [id number] Arrival date & time: 07/02/19  1813     History   Chief Complaint Chief Complaint  Patient presents with  . Hypoglycemia    HPI [name] [name] is a 33 y.o. male.     33 year old male with history of IDDM who presents with hypoglycemia.  Patient states that he has been on the same dose of Lantus and NovoLog for a while with no recent medication changes.  5 days ago, he began having problems with hypoglycemia, he would become sweaty and weak and his sister would check his blood sugar and it would be low.  She held his medications for several days because of the low blood sugars and he restarted his normal dose yesterday.  Since then, he has continued to have episodes of hypoglycemia including after midnight.  He reports normal appetite and no major changes to his diet.  He endorses some mild burning with urination.  He has had diarrhea and a runny nose but no cough, sore throat, vomiting, loss of taste or smell, fevers, sick contacts.  He reports that he had problems like this a year ago. He does not have a PCP in this area; moved here in June.  The history is provided by the patient.  Hypoglycemia   Past Medical History:  Diagnosis Date  . Diabetes mellitus without complication [facility])     Patient Active Problem List   Diagnosis Date Noted  . Hypoglycemia 07/02/2019    Past Surgical History:  Procedure Laterality Date  . HAND SURGERY          Home Medications    Prior to Admission medications   Medication Sig Start Date End Date Taking? Authorizing Provider  insulin aspart (NOVOLOG) 100 UNIT/ML FlexPen Inject 15-25 Units into the skin 3 (three) times daily. 25 units for blood sugar levels over 200. 15 units for most doses 05/10/19  Yes Devoria Albe, MD  Insulin Glargine (LANTUS SOLOSTAR) 100 UNIT/ML Solostar Pen Inject 35 Units into the skin at bedtime. 05/10/19  Yes Devoria Albe, MD   lidocaine (LIDODERM) 5 % Place 1 patch onto the skin daily. Remove & Discard patch within 12 hours or as directed by MD 06/29/19  Yes Bill Salinas, PA-C    Family History No family history on file.  Social History Social History   Tobacco Use  . Smoking status: Current Every Day Smoker    Packs/day: 0.50    Years: 10.00    Pack years: 5.00    Types: Cigarettes  . Smokeless tobacco: Former Engineer, water Use Topics  . Alcohol use: Yes  . Drug use: Never     Allergies   Patient has no known allergies.   Review of Systems Review of Systems All other systems reviewed and are negative except that which was mentioned in HPI   Physical Exam Updated Vital Signs BP (!) 147/107   Pulse (!) 107   Temp 98.8 F (37.1 C) (Oral)   Resp 15   Ht 5\' 11"  (1.803 m)   Wt 117.9 kg   SpO2 95%   BMI 36.26 kg/m   Physical Exam Vitals signs and nursing note reviewed.  Constitutional:      General: He is not in acute distress.    Appearance: He is well-developed.     Comments: Sleepy but awake  HENT:     Head: Normocephalic and atraumatic.  Eyes:     Conjunctiva/sclera:  Conjunctivae normal.  Neck:     Musculoskeletal: Neck supple.  Cardiovascular:     Rate and Rhythm: Regular rhythm. Tachycardia present.     Heart sounds: Normal heart sounds. No murmur.  Pulmonary:     Effort: Pulmonary effort is normal.     Breath sounds: Normal breath sounds.  Abdominal:     General: Bowel sounds are normal. There is no distension.     Palpations: Abdomen is soft.     Tenderness: There is no abdominal tenderness.  Skin:    General: Skin is warm and dry.  Neurological:     Mental Status: He is oriented to person, place, and time.     Comments: Fluent speech  Psychiatric:        Judgment: Judgment normal.      ED Treatments / Results  Labs (all labs ordered are listed, but only abnormal results are displayed) Labs Reviewed  COMPREHENSIVE METABOLIC PANEL - Abnormal; Notable  for the following components:      Result Value   Glucose, Bld 29 (*)    BUN 25 (*)    All other components within normal limits  CBC WITH DIFFERENTIAL/PLATELET - Abnormal; Notable for the following components:   RBC 5.91 (*)    Hemoglobin 17.6 (*)    All other components within normal limits  HEMOGLOBIN A1C - Abnormal; Notable for the following components:   Hgb A1c MFr Bld 8.3 (*)    All other components within normal limits  CBG MONITORING, ED - Abnormal; Notable for the following components:   Glucose-Capillary 37 (*)    All other components within normal limits  CBG MONITORING, ED - Abnormal; Notable for the following components:   Glucose-Capillary 58 (*)    All other components within normal limits  CBG MONITORING, ED - Abnormal; Notable for the following components:   Glucose-Capillary 36 (*)    All other components within normal limits  CBG MONITORING, ED - Abnormal; Notable for the following components:   Glucose-Capillary 32 (*)    All other components within normal limits  CBG MONITORING, ED - Abnormal; Notable for the following components:   Glucose-Capillary 36 (*)    All other components within normal limits  CBG MONITORING, ED - Abnormal; Notable for the following components:   Glucose-Capillary 102 (*)    All other components within normal limits  SARS CORONAVIRUS 2 (TAT 6-24 HRS)  BETA-HYDROXYBUTYRIC ACID  URINALYSIS, ROUTINE W REFLEX MICROSCOPIC  TSH  CORTISOL  C-PEPTIDE  POC SARS CORONAVIRUS 2 AG -  ED  CBG MONITORING, ED  CBG MONITORING, ED    EKG None  Radiology No results found.  Procedures .Critical Care Performed by: Laurence SpatesLittle, Buzz Axel Morgan, MD Authorized by: Laurence SpatesLittle, Draeden Kellman Morgan, MD   Critical care provider statement:    Critical care time (minutes):  30   Critical care time was exclusive of:  Separately billable procedures and treating other patients   Critical care was necessary to treat or prevent imminent or life-threatening  deterioration of the following conditions:  Endocrine crisis   Critical care was time spent personally by me on the following activities:  Development of treatment plan with patient or surrogate, evaluation of patient's response to treatment, examination of patient, obtaining history from patient or surrogate, ordering and review of laboratory studies, ordering and review of radiographic studies, review of old charts, re-evaluation of patient's condition and ordering and performing treatments and interventions   (including critical care time)  Medications Ordered in ED  Medications  dextrose 5 %-0.9 % sodium chloride infusion ( Intravenous Rate/Dose Change 07/02/19 2240)  dextrose 50 % solution 50 mL (50 mLs Intravenous Given 07/02/19 2032)  dextrose (GLUTOSE) 40 % oral gel (1 Tube  Given 07/02/19 2032)  dextrose 50 % solution 50 mL (50 mLs Intravenous Given 07/02/19 2240)     Initial Impression / Assessment and Plan / ED Course  I have reviewed the triage vital signs and the nursing notes.  Pertinent labs that were available during my care of the patient were reviewed by me and considered in my medical decision making (see chart for details).        BG 37 at triage; gave orange juice and repeat 58. I evaluated pt and gave 2 more cups of OJ; repeat 36 therefore gave D50 and started D5 drip.   Patient initially improved to 70s but then dropped back down again into the 30s even after drinking multiple cups of juice. Gave 2nd amp D50, increased D5 drip, and pt is eating sandwiches, apple sauce and soda. He has remained alert and mentating normally throughout ED course.  Point-of-care COVID-19 testing is negative.  CMP and CBC overall reassuring.  It is unclear whether patient may have incorrectly administered his insulin.  He is not on any oral medications.  No indication that patient has attempted self-harm.  Discussed admission with [facility], Dr. Hal Hope.  Final Clinical Impressions(s) / ED  Diagnoses   Final diagnoses:  None    ED Discharge Orders    None       Robynne Roat, Wenda Overland, MD 07/02/19 2310

## 2019-07-02 NOTE — ED Notes (Signed)
Pt alert and oriented at this time.  Pt diaphoretic.  Charge nurse made aware of pt's CBG and is currently attempted to find a room for pt.  Pt given Orange Juice with sugar in lobby.

## 2019-07-02 NOTE — ED Triage Notes (Signed)
Pt st's he is diabetic and his blood sugar keeps dropping.  Pt st's he took his insulin today and ate.  Pt c/p feeling shaky and sweating

## 2019-07-02 NOTE — ED Notes (Signed)
Pt had a large bowel movement, stating that he believes it was due to all the apple juice he drank attempting to get his blood sugar up.

## 2019-07-02 NOTE — ED Notes (Signed)
Pt given 1- 8oz cup of apple juice, and 1-8oz Coke.

## 2019-07-02 NOTE — ED Notes (Signed)
Pt given 2- 8oz cups of Apple Juice.

## 2019-07-03 ENCOUNTER — Other Ambulatory Visit: Payer: Self-pay

## 2019-07-03 ENCOUNTER — Encounter (HOSPITAL_COMMUNITY): Payer: Self-pay | Admitting: Internal Medicine

## 2019-07-03 DIAGNOSIS — E162 Hypoglycemia, unspecified: Secondary | ICD-10-CM

## 2019-07-03 LAB — CBG MONITORING, ED
Glucose-Capillary: 116 mg/dL — ABNORMAL HIGH (ref 70–99)
Glucose-Capillary: 130 mg/dL — ABNORMAL HIGH (ref 70–99)
Glucose-Capillary: 139 mg/dL — ABNORMAL HIGH (ref 70–99)
Glucose-Capillary: 141 mg/dL — ABNORMAL HIGH (ref 70–99)
Glucose-Capillary: 142 mg/dL — ABNORMAL HIGH (ref 70–99)
Glucose-Capillary: 156 mg/dL — ABNORMAL HIGH (ref 70–99)
Glucose-Capillary: 176 mg/dL — ABNORMAL HIGH (ref 70–99)
Glucose-Capillary: 192 mg/dL — ABNORMAL HIGH (ref 70–99)
Glucose-Capillary: 27 mg/dL — CL (ref 70–99)
Glucose-Capillary: 70 mg/dL (ref 70–99)
Glucose-Capillary: 78 mg/dL (ref 70–99)
Glucose-Capillary: 80 mg/dL (ref 70–99)
Glucose-Capillary: 84 mg/dL (ref 70–99)
Glucose-Capillary: 87 mg/dL (ref 70–99)
Glucose-Capillary: 92 mg/dL (ref 70–99)

## 2019-07-03 LAB — URINALYSIS, ROUTINE W REFLEX MICROSCOPIC
Bilirubin Urine: NEGATIVE
Glucose, UA: 50 mg/dL — AB
Hgb urine dipstick: NEGATIVE
Ketones, ur: NEGATIVE mg/dL
Leukocytes,Ua: NEGATIVE
Nitrite: NEGATIVE
Protein, ur: NEGATIVE mg/dL
Specific Gravity, Urine: 1.027 (ref 1.005–1.030)
pH: 5 (ref 5.0–8.0)

## 2019-07-03 LAB — GLUCOSE, CAPILLARY
Glucose-Capillary: 115 mg/dL — ABNORMAL HIGH (ref 70–99)
Glucose-Capillary: 107 mg/dL — ABNORMAL HIGH (ref 70–99)
Glucose-Capillary: 106 mg/dL — ABNORMAL HIGH (ref 70–99)
Glucose-Capillary: 68 mg/dL — ABNORMAL LOW (ref 70–99)
Glucose-Capillary: 89 mg/dL (ref 70–99)

## 2019-07-03 LAB — CBC
HCT: 47.8 % (ref 39.0–52.0)
Hemoglobin: 15.8 g/dL (ref 13.0–17.0)
MCH: 28.9 pg (ref 26.0–34.0)
MCHC: 33.1 g/dL (ref 30.0–36.0)
MCV: 87.4 fL (ref 80.0–100.0)
Platelets: 210 10*3/uL (ref 150–400)
RBC: 5.47 MIL/uL (ref 4.22–5.81)
RDW: 12.6 % (ref 11.5–15.5)
WBC: 5.8 10*3/uL (ref 4.0–10.5)
nRBC: 0 % (ref 0.0–0.2)

## 2019-07-03 LAB — RAPID URINE DRUG SCREEN, HOSP PERFORMED
Amphetamines: POSITIVE — AB
Barbiturates: NOT DETECTED
Benzodiazepines: NOT DETECTED
Cocaine: NOT DETECTED
Opiates: NOT DETECTED
Tetrahydrocannabinol: NOT DETECTED

## 2019-07-03 LAB — BASIC METABOLIC PANEL
Anion gap: 8 (ref 5–15)
BUN: 17 mg/dL (ref 6–20)
CO2: 23 mmol/L (ref 22–32)
Calcium: 8.4 mg/dL — ABNORMAL LOW (ref 8.9–10.3)
Chloride: 106 mmol/L (ref 98–111)
Creatinine, Ser: 0.98 mg/dL (ref 0.61–1.24)
GFR calc Af Amer: >60 mL/min (ref >60)
GFR calc non Af Amer: >60 mL/min (ref >60)
Glucose, Bld: 79 mg/dL (ref 70–99)
Potassium: 3.6 mmol/L (ref 3.5–5.1)
Sodium: 137 mmol/L (ref 135–145)

## 2019-07-03 LAB — HIV ANTIBODY (ROUTINE TESTING W REFLEX): HIV Screen 4th Generation wRfx: NONREACTIVE

## 2019-07-03 LAB — TSH: TSH: 0.378 u[IU]/mL (ref 0.350–4.500)

## 2019-07-03 LAB — BETA-HYDROXYBUTYRIC ACID: Beta-Hydroxybutyric Acid: <0.05 mmol/L — ABNORMAL LOW (ref 0.05–0.27)

## 2019-07-03 LAB — SARS CORONAVIRUS 2 (TAT 6-24 HRS): SARS Coronavirus 2: NEGATIVE

## 2019-07-03 LAB — CORTISOL: Cortisol, Plasma: 4.6 ug/dL

## 2019-07-03 MED ORDER — DEXTROSE 10 % IV SOLN
INTRAVENOUS | Status: DC
  Administered 2019-07-03 – 2019-07-04 (×3): via INTRAVENOUS

## 2019-07-03 MED ORDER — INFLUENZA VAC SPLIT QUAD 0.5 ML IM SUSY
0.5000 mL | PREFILLED_SYRINGE | INTRAMUSCULAR | Status: DC
  Filled 2019-07-03: qty 0.5

## 2019-07-03 MED ORDER — ENOXAPARIN SODIUM 40 MG/0.4ML ~~LOC~~ SOLN
40.0000 mg | SUBCUTANEOUS | Status: DC
  Filled 2019-07-03 (×4): qty 0.4

## 2019-07-03 MED ORDER — ACETAMINOPHEN 325 MG PO TABS: 650.0000 mg | ORAL_TABLET | Freq: Four times a day (QID) | ORAL | Status: DC | PRN

## 2019-07-03 MED ORDER — ONDANSETRON HCL 4 MG/2ML IJ SOLN: 4.0000 mg | Freq: Four times a day (QID) | INTRAMUSCULAR | Status: DC | PRN

## 2019-07-03 MED ORDER — ONDANSETRON HCL 4 MG PO TABS: 4.0000 mg | ORAL_TABLET | Freq: Four times a day (QID) | ORAL | Status: DC | PRN

## 2019-07-03 MED ORDER — DEXTROSE 50 % IV SOLN
INTRAVENOUS | Status: AC
  Administered 2019-07-03: 05:00:00
  Filled 2019-07-03: qty 50

## 2019-07-03 MED ORDER — ACETAMINOPHEN 650 MG RE SUPP: 650.0000 mg | Freq: Four times a day (QID) | RECTAL | Status: DC | PRN

## 2019-07-03 NOTE — ED Notes (Signed)
Pt given another sandwich bag and soda.

## 2019-07-03 NOTE — ED Notes (Signed)
Upon request, pt given sandwich, applesauce, & a coke.

## 2019-07-03 NOTE — ED Notes (Signed)
Lunch tray ordered. Gave [place] sand bag in meantime

## 2019-07-03 NOTE — ED Notes (Signed)
SDU ordered bfast 

## 2019-07-03 NOTE — Progress Notes (Signed)
Patient ID: [name] [name], male   DOB: 1986-01-17, 33 y.o.   MRN: [id number] Patient was admitted early this morning for hypoglycemia and is currently on D10 drip.  I reviewed patient's medical records including this morning's as per myself.  Seen and examined the patient at bedside and discussed the plan of care with him.  Continue D10 drip.  Monitor CBGs.  Will request care management evaluation as patient does not have a PCP.  Possible discharge tomorrow.

## 2019-07-03 NOTE — H&P (Signed)
History and Physical    [name] [name]:[id number] DOB: [birth year] DOA: 07/02/2019  PCP: Patient, No Pcp Per  Patient coming from: Home.  Chief Complaint: Low blood sugar.  HPI: [name] [name] is a 33 y.o. male with history of diabetes mellitus type 1 on Lantus insulin has been experiencing low blood sugars for last 4 to 5 days.  Initially patient became diaphoretic at home when he became hypoglycemic then he stopped taking his insulin last 3 days.  Started back again with yesterday and he became again hypoglycemic.  He states he has been eating well has had couple of episodes of diarrhea.  No vomiting and has been eating which he usually eats.  Recently had come to the ER on June 29 2023 days ago for low back pain at that time CT abdomen pelvis done for renal stone was unremarkable.  Was done without contrast.  Was discharged home on symptomatic management for low back pain.  Patient states his medications are at times injected by his sister who has been doing it for long time and is well aware of how to do it.  Denies any suicidal ideation.  Patient denies any weight loss recently or any abdominal pain.  Patient denies taking any antibiotics or antifungals has not been on any steroids.  ED Course: In the ER patient's blood sugar has repeatedly been less than 60 even despite eating.  Had to be started on D50 following which D5W was started and patient is being admitted for hypoglycemia cause not clear.  Lab work shows creatinine of 1.1 which is around his baseline when compared to October 2020 and his GFR is normal per the Patient.  Blood glucose in the metabolic panel was 29.  UA is unremarkable COVID-19 is negative.  Patient admitted for hypoglycemia of unknown cause.  Review of Systems: As per HPI, rest all negative.   Past Medical History:  Diagnosis Date  . Diabetes mellitus without complication [facility])     Past Surgical History:  Procedure Laterality Date  . HAND SURGERY        reports that he has been smoking cigarettes. He has a 5.00 pack-year smoking history. He has quit using smokeless tobacco. He reports current alcohol use. He reports that he does not use drugs.  No Known Allergies  Family History  Family history unknown: Yes    Prior to Admission medications   Medication Sig Start Date End Date Taking? Authorizing Provider  insulin aspart (NOVOLOG) 100 UNIT/ML FlexPen Inject 15-25 Units into the skin 3 (three) times daily. 25 units for blood sugar levels over 200. 15 units for most doses 05/10/19  Yes Rolland Porter, MD  Insulin Glargine (LANTUS SOLOSTAR) 100 UNIT/ML Solostar Pen Inject 35 Units into the skin at bedtime. 05/10/19  Yes Rolland Porter, MD  lidocaine (LIDODERM) 5 % Place 1 patch onto the skin daily. Remove & Discard patch within 12 hours or as directed by MD 06/29/19  Yes Deliah Boston, PA-C    Physical Exam: Constitutional: Moderately built and nourished. Vitals:   07/02/19 2215 07/02/19 2230 07/02/19 2300 07/02/19 2345  BP: (!) 127/100 (!) 147/107 (!) 138/94 126/68  Pulse:    90  Resp: 19 15 18 17   Temp:      TempSrc:      SpO2:    98%  Weight:      Height:       Eyes: Anicteric no pallor. ENMT: No discharge from the ears eyes nose  or mouth. Neck: No mass felt.  No neck rigidity. Respiratory: No rhonchi or crepitations. Cardiovascular: S1-S2 heard. Abdomen: Soft nontender bowel sounds present. Musculoskeletal: No edema.  No joint effusion. Skin: No rash. Neurologic: Alert awake oriented to time place and person.  Moves all extremities. Psychiatric: Appears normal.   Labs on Admission: I have personally reviewed following labs and imaging studies  CBC: Recent Labs  Lab 07/02/19 2053  WBC 9.0  NEUTROABS 4.9  HGB 17.6*  HCT 51.4  MCV 87.0  PLT 295   Basic Metabolic Panel: Recent Labs  Lab 07/02/19 2053  NA 136  K 3.8  CL 102  CO2 23  GLUCOSE 29*  BUN 25*  CREATININE 1.17  CALCIUM 9.2   GFR: Estimated  Creatinine Clearance: 116.1 mL/min (by C-G formula based on SCr of 1.17 mg/dL). Liver Function Tests: Recent Labs  Lab 07/02/19 2053  AST 29  ALT 18  ALKPHOS 67  BILITOT 0.5  PROT 7.6  ALBUMIN 4.0   No results for input(s): LIPASE, AMYLASE in the last 168 hours. No results for input(s): AMMONIA in the last 168 hours. Coagulation Profile: No results for input(s): INR, PROTIME in the last 168 hours. Cardiac Enzymes: No results for input(s): CKTOTAL, CKMB, CKMBINDEX, TROPONINI in the last 168 hours. BNP (last 3 results) No results for input(s): PROBNP in the last 8760 hours. HbA1C: Recent Labs    07/02/19 2053  HGBA1C 8.3*   CBG: Recent Labs  Lab 07/02/19 2043 07/02/19 2156 07/02/19 2229 07/02/19 2302 07/02/19 2331  GLUCAP 90 71 36* 102* 95   Lipid Profile: No results for input(s): CHOL, HDL, LDLCALC, TRIG, CHOLHDL, LDLDIRECT in the last 72 hours. Thyroid Function Tests: No results for input(s): TSH, T4TOTAL, FREET4, T3FREE, THYROIDAB in the last 72 hours. Anemia Panel: No results for input(s): VITAMINB12, FOLATE, FERRITIN, TIBC, IRON, RETICCTPCT in the last 72 hours. Urine analysis:    Component Value Date/Time   COLORURINE YELLOW 06/29/2019 1015   APPEARANCEUR HAZY (A) 06/29/2019 1015   LABSPEC 1.028 06/29/2019 1015   PHURINE 5.0 06/29/2019 1015   GLUCOSEU >=500 (A) 06/29/2019 1015   HGBUR NEGATIVE 06/29/2019 1015   BILIRUBINUR NEGATIVE 06/29/2019 1015   KETONESUR NEGATIVE 06/29/2019 1015   PROTEINUR 30 (A) 06/29/2019 1015   NITRITE NEGATIVE 06/29/2019 1015   LEUKOCYTESUR NEGATIVE 06/29/2019 1015   Sepsis Labs: @LABRCNTIP (procalcitonin:4,lacticidven:4) )No results found for this or any previous visit (from the past 240 hour(s)).   Radiological Exams on Admission: No results found.    Assessment/Plan Principal Problem:   Hypoglycemia    1. Hypoglycemia in the setting of taking insulin -because of hypoglycemia not clear.  Patient has had multiple  sandwiches in the ER and D50 and D5W despite patient is becoming hypoglycemic.  Will check C-peptide levels, cortisol levels, beta hydroxybutyric acid levels and follow metabolic panel closely.  Check sulfonylurea panel.  Closely monitor CBGs every hour. 2. Recent low back pain CT scan was showing nothing acute.  Patient is not complaining of any back pain at this time. 3. Elevated blood pressure closely follow blood pressure trends.   DVT prophylaxis: Lovenox. Code Status: Full code. Family Communication: Discussed with patient. Disposition Plan: Home. Consults called: None. Admission status: Inpatient.   MD [facility] Pager [phone].  If 7PM-7AM, please contact night-coverage [email or web address] Password [email or web address]  07/03/2019, 12:04 AM

## 2019-07-03 NOTE — ED Notes (Signed)
Pt given sandwich and 2 cokes.

## 2019-07-04 LAB — GLUCOSE, CAPILLARY
Glucose-Capillary: 110 mg/dL — ABNORMAL HIGH (ref 70–99)
Glucose-Capillary: 119 mg/dL — ABNORMAL HIGH (ref 70–99)
Glucose-Capillary: 126 mg/dL — ABNORMAL HIGH (ref 70–99)
Glucose-Capillary: 146 mg/dL — ABNORMAL HIGH (ref 70–99)
Glucose-Capillary: 151 mg/dL — ABNORMAL HIGH (ref 70–99)
Glucose-Capillary: 152 mg/dL — ABNORMAL HIGH (ref 70–99)
Glucose-Capillary: 174 mg/dL — ABNORMAL HIGH (ref 70–99)
Glucose-Capillary: 180 mg/dL — ABNORMAL HIGH (ref 70–99)
Glucose-Capillary: 253 mg/dL — ABNORMAL HIGH (ref 70–99)
Glucose-Capillary: 44 mg/dL — CL (ref 70–99)
Glucose-Capillary: 47 mg/dL — ABNORMAL LOW (ref 70–99)
Glucose-Capillary: 60 mg/dL — ABNORMAL LOW (ref 70–99)
Glucose-Capillary: 75 mg/dL (ref 70–99)
Glucose-Capillary: 75 mg/dL (ref 70–99)
Glucose-Capillary: 94 mg/dL (ref 70–99)
Glucose-Capillary: 99 mg/dL (ref 70–99)

## 2019-07-04 LAB — COMPREHENSIVE METABOLIC PANEL
ALT: 14 U/L (ref 0–44)
AST: 15 U/L (ref 15–41)
Albumin: 3 g/dL — ABNORMAL LOW (ref 3.5–5.0)
Alkaline Phosphatase: 47 U/L (ref 38–126)
Anion gap: 5 (ref 5–15)
BUN: 10 mg/dL (ref 6–20)
CO2: 25 mmol/L (ref 22–32)
Calcium: 8.5 mg/dL — ABNORMAL LOW (ref 8.9–10.3)
Chloride: 106 mmol/L (ref 98–111)
Creatinine, Ser: 1.04 mg/dL (ref 0.61–1.24)
GFR calc Af Amer: >60 mL/min (ref >60)
GFR calc non Af Amer: >60 mL/min (ref >60)
Glucose, Bld: 138 mg/dL — ABNORMAL HIGH (ref 70–99)
Potassium: 3.8 mmol/L (ref 3.5–5.1)
Sodium: 136 mmol/L (ref 135–145)
Total Bilirubin: 0.5 mg/dL (ref 0.3–1.2)
Total Protein: 6.2 g/dL — ABNORMAL LOW (ref 6.5–8.1)

## 2019-07-04 LAB — CBC WITH DIFFERENTIAL/PLATELET
Abs Immature Granulocytes: 0.01 10*3/uL (ref 0.00–0.07)
Basophils Absolute: 0 10*3/uL (ref 0.0–0.1)
Basophils Relative: 1 %
Eosinophils Absolute: 0.1 10*3/uL (ref 0.0–0.5)
Eosinophils Relative: 3 %
HCT: 47.1 % (ref 39.0–52.0)
Hemoglobin: 16.1 g/dL (ref 13.0–17.0)
Immature Granulocytes: 0 %
Lymphocytes Relative: 50 %
Lymphs Abs: 2.3 10*3/uL (ref 0.7–4.0)
MCH: 29.5 pg (ref 26.0–34.0)
MCHC: 34.2 g/dL (ref 30.0–36.0)
MCV: 86.3 fL (ref 80.0–100.0)
Monocytes Absolute: 0.6 10*3/uL (ref 0.1–1.0)
Monocytes Relative: 13 %
Neutro Abs: 1.5 10*3/uL — ABNORMAL LOW (ref 1.7–7.7)
Neutrophils Relative %: 33 %
Platelets: 204 10*3/uL (ref 150–400)
RBC: 5.46 MIL/uL (ref 4.22–5.81)
RDW: 12.8 % (ref 11.5–15.5)
WBC: 4.6 10*3/uL (ref 4.0–10.5)
nRBC: 0 % (ref 0.0–0.2)

## 2019-07-04 LAB — C-PEPTIDE: C-Peptide: 0.5 ng/mL — ABNORMAL LOW (ref 1.1–4.4)

## 2019-07-04 LAB — MAGNESIUM: Magnesium: 1.9 mg/dL (ref 1.7–2.4)

## 2019-07-04 MED ORDER — DEXTROSE 50 % IV SOLN
INTRAVENOUS | Status: AC
  Administered 2019-07-04: 09:00:00 25 mL
  Filled 2019-07-04: qty 50

## 2019-07-04 MED ORDER — DEXTROSE 10 % IV SOLN
INTRAVENOUS | Status: DC
  Administered 2019-07-04 – 2019-07-05 (×4): via INTRAVENOUS

## 2019-07-04 MED ORDER — DEXTROSE 50 % IV SOLN
50.0000 mL | INTRAVENOUS | Status: DC | PRN
  Administered 2019-07-04: 50 mL via INTRAVENOUS
  Filled 2019-07-04: qty 50

## 2019-07-04 MED ORDER — DEXTROSE 50 % IV SOLN
25.0000 mL | INTRAVENOUS | Status: DC | PRN
  Administered 2019-07-04: 10:00:00 25 mL via INTRAVENOUS

## 2019-07-04 NOTE — Progress Notes (Signed)
Hypoglycemic Event  CBG: 47  Treatment: 25 mL D50, peanut butter and crackers given, 240 mL orange juice given.  Symptoms: Sweating, shakiness, weakness, patient states "I think my sugar is low again."  Follow-up CBG: Time: 1003 CBG Result: 60  Treatment: 50 mL D50, [place] sandwich, regular coke, and orange juice given to patient at this time.  Follow-up CBG: Time: 1039    CBG Result: 94  Possible Reasons for Event: Unknown; patient is eating 100% of meals and also eating several snacks. All insulin is on hold.  Comments/MD notified: Dr. Starla Link was paged and new orders received; PRN dose of D50 was increased from 44mL to 79mL, D10 infusion was increased to 150 mL/hr, and diet was changed from carb modified to regular.    [name] [name]

## 2019-07-04 NOTE — Progress Notes (Signed)
Hypoglycemic Event  CBG: 44  Treatment: 25 mL D50; 120 cc orange juice; graham crackers and peanut butter.  Symptoms: Sweating, weakness, patient states "I feel like my sugar is low."  Follow-up CBG: Time: 0849 CBG Result: 75  Possible Reasons for Event: Patient being worked up for hypoglycemic events; insulin on hold; patient eating and drinking well; reason for event unknown. Patient does state that he doubled his insulin on the evening of 12/3 because he was eating at [facility].   Comments/MD notified: Notified Dr. Starla Link; D10 infusion was restarted at 100 mL/hr.     Janey Genta

## 2019-07-04 NOTE — Progress Notes (Signed)
Patient ID: [name] [name], male   DOB: Aug 10, 1985, 33 y.o.   MRN: [id number]  PROGRESS NOTE    [name] [name]  HQI:[id number] DOB: [birth year] DOA: 07/02/2019 PCP: Patient, No Pcp Per   Brief Narrative:  33 year old male with history of diabetes mellitus type 1 on Lantus presented with low blood sugar.  In the ER, patient was persistently hypoglycemic; blood glucose in the metabolic panel was 29.  He was started on dextrose drip.  COVID-19 was negative.  Assessment & Plan:   Hypoglycemia in the setting of insulin use in a patient with diabetes mellitus type 1 -Blood glucose in the metabolic panel was 29 on presentation.  Currently on D10 drip.  CBG this morning was still in the 40s.  We will continue D10 drip for now.  Continue CBGs every 2 hours.  We will change the diet to regular diet. -A1c 8.3.  Patient will not be discharged on any insulin when he is ready for discharge at this time.  Elevated blood pressure -Blood pressure still intermittently elevated.  Will monitor for now.  Might need antihypertensives as an outpatient.  DVT prophylaxis: Lovenox Code Status: Full Family Communication: Spoke to patient at bedside Disposition Plan: Home tomorrow if clinically improved. Consultants: None  Procedures: None  Antimicrobials: None   Subjective: Patient seen and examined at bedside.  No overnight fever or vomiting.  As per nursing staff, patient had episode of hypoglycemia this morning and patient had complained of sweating/weakness/dizziness.  Objective: Vitals:   07/03/19 1641 07/03/19 2007 07/04/19 0005 07/04/19 0435  BP: (!) 152/95 (!) 146/94 136/82 (!) 141/100  Pulse: 89 82 83 79  Resp: 18     Temp: 98.5 F (36.9 C) 98.2 F (36.8 C) 98 F (36.7 C) 98.2 F (36.8 C)  TempSrc: Oral Oral Oral Oral  SpO2: 99% 99% 98% 99%  Weight:    120.8 kg  Height:        Intake/Output Summary (Last 24 hours) at 07/04/2019 0959 Last data filed at 07/04/2019 0636 Gross per 24 hour   Intake 3444.59 ml  Output 1725 ml  Net 1719.59 ml   Filed Weights   07/02/19 1839 07/03/19 1420 07/04/19 0435  Weight: 117.9 kg 120.6 kg 120.8 kg    Examination:  General exam: Appears calm and comfortable  Respiratory system: Bilateral decreased breath sounds at bases Cardiovascular system: S1 & S2 heard, Rate controlled Gastrointestinal system: Abdomen is nondistended, soft and nontender. Normal bowel sounds heard. Extremities: No cyanosis, clubbing, edema    Data Reviewed: I have personally reviewed following labs and imaging studies  CBC: Recent Labs  Lab 07/02/19 2053 07/03/19 0400 07/04/19 0426  WBC 9.0 5.8 4.6  NEUTROABS 4.9  --  1.5*  HGB 17.6* 15.8 16.1  HCT 51.4 47.8 47.1  MCV 87.0 87.4 86.3  PLT 295 210 204   Basic Metabolic Panel: Recent Labs  Lab 07/02/19 2053 07/03/19 0400 07/04/19 0426  NA 136 137 136  K 3.8 3.6 3.8  CL 102 106 106  CO2 23 23 25   GLUCOSE 29* 79 138*  BUN 25* 17 10  CREATININE 1.17 0.98 1.04  CALCIUM 9.2 8.4* 8.5*  MG  --   --  1.9   GFR: Estimated Creatinine Clearance: 132.4 mL/min (by C-G formula based on SCr of 1.04 mg/dL). Liver Function Tests: Recent Labs  Lab 07/02/19 2053 07/04/19 0426  AST 29 15  ALT 18 14  ALKPHOS 67 47  BILITOT 0.5 0.5  PROT 7.6  6.2*  ALBUMIN 4.0 3.0*   No results for input(s): LIPASE, AMYLASE in the last 168 hours. No results for input(s): AMMONIA in the last 168 hours. Coagulation Profile: No results for input(s): INR, PROTIME in the last 168 hours. Cardiac Enzymes: No results for input(s): CKTOTAL, CKMB, CKMBINDEX, TROPONINI in the last 168 hours. BNP (last 3 results) No results for input(s): PROBNP in the last 8760 hours. HbA1C: Recent Labs    07/02/19 2053  HGBA1C 8.3*   CBG: Recent Labs  Lab 07/04/19 0433 07/04/19 0619 07/04/19 0823 07/04/19 0849 07/04/19 0928  GLUCAP 126* 180* 44* 75 47*   Lipid Profile: No results for input(s): CHOL, HDL, LDLCALC, TRIG, CHOLHDL,  LDLDIRECT in the last 72 hours. Thyroid Function Tests: Recent Labs    07/03/19 0400  TSH 0.378   Anemia Panel: No results for input(s): VITAMINB12, FOLATE, FERRITIN, TIBC, IRON, RETICCTPCT in the last 72 hours. Sepsis Labs: No results for input(s): PROCALCITON, LATICACIDVEN in the last 168 hours.  Recent Results (from the past 240 hour(s))  SARS CORONAVIRUS 2 (TAT 6-24 HRS) Nasopharyngeal Nasopharyngeal Swab     Status: None   Collection Time: 07/03/19 12:16 AM   Specimen: Nasopharyngeal Swab  Result Value Ref Range Status   SARS Coronavirus 2 NEGATIVE NEGATIVE Final    Comment: (NOTE) SARS-CoV-2 target nucleic acids are NOT DETECTED. The SARS-CoV-2 RNA is generally detectable in upper and lower respiratory specimens during the acute phase of infection. Negative results do not preclude SARS-CoV-2 infection, do not rule out co-infections with other pathogens, and should not be used as the sole basis for treatment or other patient management decisions. Negative results must be combined with clinical observations, patient history, and epidemiological information. The expected result is Negative. Fact Sheet for Patients: SugarRoll.be Fact Sheet for Healthcare Providers: https://www.woods-mathews.com/ This test is not yet approved or cleared by the [place] FDA and  has been authorized for detection and/or diagnosis of SARS-CoV-2 by FDA under an Emergency Use Authorization (EUA). This EUA will remain  in effect (meaning this test can be used) for the duration of the COVID-19 declaration under Section 56 4(b)(1) of the Act, 21 U.S.C. section 360bbb-3(b)(1), unless the authorization is terminated or revoked sooner. Performed at [facility], [place] [address] [place], [place] [address]          Radiology Studies: No results found.      Scheduled Meds: . enoxaparin (LOVENOX) injection  40 mg Subcutaneous Q24H  .  influenza vac split quadrivalent PF  0.5 mL Intramuscular Tomorrow-1000   Continuous Infusions: . dextrose            Aline August, MD [facility] 07/04/2019, 9:59 AM

## 2019-07-05 DIAGNOSIS — I10 Essential (primary) hypertension: Secondary | ICD-10-CM

## 2019-07-05 LAB — GLUCOSE, CAPILLARY
Glucose-Capillary: 126 mg/dL — ABNORMAL HIGH (ref 70–99)
Glucose-Capillary: 142 mg/dL — ABNORMAL HIGH (ref 70–99)
Glucose-Capillary: 106 mg/dL — ABNORMAL HIGH (ref 70–99)
Glucose-Capillary: 40 mg/dL — CL (ref 70–99)
Glucose-Capillary: 104 mg/dL — ABNORMAL HIGH (ref 70–99)
Glucose-Capillary: 125 mg/dL — ABNORMAL HIGH (ref 70–99)
Glucose-Capillary: 140 mg/dL — ABNORMAL HIGH (ref 70–99)
Glucose-Capillary: 135 mg/dL — ABNORMAL HIGH (ref 70–99)
Glucose-Capillary: 134 mg/dL — ABNORMAL HIGH (ref 70–99)
Glucose-Capillary: 164 mg/dL — ABNORMAL HIGH (ref 70–99)
Glucose-Capillary: 221 mg/dL — ABNORMAL HIGH (ref 70–99)
Glucose-Capillary: 262 mg/dL — ABNORMAL HIGH (ref 70–99)

## 2019-07-05 LAB — BASIC METABOLIC PANEL
Anion gap: 9 (ref 5–15)
BUN: 9 mg/dL (ref 6–20)
CO2: 23 mmol/L (ref 22–32)
Calcium: 9.2 mg/dL (ref 8.9–10.3)
Chloride: 107 mmol/L (ref 98–111)
Creatinine, Ser: 1.06 mg/dL (ref 0.61–1.24)
GFR calc Af Amer: >60 mL/min (ref >60)
GFR calc non Af Amer: >60 mL/min (ref >60)
Glucose, Bld: 118 mg/dL — ABNORMAL HIGH (ref 70–99)
Potassium: 3.5 mmol/L (ref 3.5–5.1)
Sodium: 139 mmol/L (ref 135–145)

## 2019-07-05 MED ORDER — AMLODIPINE BESYLATE 10 MG PO TABS
10.0000 mg | ORAL_TABLET | Freq: Every day | ORAL | Status: DC
  Administered 2019-07-05 – 2019-07-06 (×2): 10 mg via ORAL
  Filled 2019-07-05 (×2): qty 1

## 2019-07-05 NOTE — Progress Notes (Signed)
Patient wanted to leave AMA at the beginning of the shift. Pt educated and encouraged to stay due to blood sugar being low. Pt acknowledged understanding and decided to stay.

## 2019-07-05 NOTE — Plan of Care (Signed)
  Problem: Education: Goal: Knowledge of General Education information will improve Description: Including pain rating scale, medication(s)/side effects and non-pharmacologic comfort measures Outcome: Progressing   Problem: Health Behavior/Discharge Planning: Goal: Ability to manage health-related needs will improve Outcome: Progressing   Problem: Clinical Measurements: Goal: Ability to maintain clinical measurements within normal limits will improve Outcome: Progressing Goal: Will remain free from infection Outcome: Progressing Goal: Respiratory complications will improve Outcome: Progressing Goal: Cardiovascular complication will be avoided Outcome: Progressing   Problem: Activity: Goal: Risk for activity intolerance will decrease Outcome: Progressing   Problem: Nutrition: Goal: Adequate nutrition will be maintained Outcome: Progressing   Problem: Coping: Goal: Level of anxiety will decrease Outcome: Progressing   Problem: Elimination: Goal: Will not experience complications related to bowel motility Outcome: Progressing Goal: Will not experience complications related to urinary retention Outcome: Progressing   Problem: Pain Managment: Goal: General experience of comfort will improve Outcome: Progressing   Problem: Safety: Goal: Ability to remain free from injury will improve Outcome: Progressing   

## 2019-07-05 NOTE — Progress Notes (Signed)
Patient ID: [name] [name], male   DOB: [birth year], 33 y.o.   MRN: [id number]  PROGRESS NOTE    [name] [name]  IZT:[id number] DOB: 17-Mar-1986 DOA: 07/02/2019 PCP: Patient, No Pcp Per   Brief Narrative:  33 year old male with history of diabetes mellitus type 1 on Lantus presented with low blood sugar.  In the ER, patient was persistently hypoglycemic; blood glucose in the metabolic panel was 29.  He was started on dextrose drip.  COVID-19 was negative.  Assessment & Plan:   Hypoglycemia in the setting of insulin use in a patient with diabetes mellitus type 1 -Blood glucose in the metabolic panel was 29 on presentation.  Currently on D10 drip.  CBG this morning was was again 40.  Venous blood glucose was 118.  Spoke to Dr. Kerr/endocrinologist on phone on 07/05/2019 and he recommended to check venous blood glucose at the time when patient's CBG shows hypoglycemia.  It would be unusual for patient to remain hypoglycemic 3 or 4 days after long-acting insulin intake.  Will DC dextrose drip and continue monitoring CBGs.  If CBG shows hypoglycemia, will check venous blood glucose. -A1c 8.3.  Patient will not be discharged on any insulin when he is ready for discharge at this time. -He will need outpatient endocrinology evaluation.  Consult care management for arrangement for PCP as an outpatient.  Hypertension -Blood pressure still intermittently elevated.  Will start amlodipine.  DVT prophylaxis: Lovenox Code Status: Full Family Communication: Spoke to patient at bedside Disposition Plan: Home tomorrow if clinically improved. Consultants: None  Procedures: None  Antimicrobials: None   Subjective: Patient seen and examined at bedside.  Denies worsening shortness of breath, chest pain, nausea or vomiting. Objective: Vitals:   07/04/19 0435 07/04/19 1618 07/04/19 2048 07/05/19 0406  BP: (!) 141/100 (!) 144/94 (!) 175/105 (!) 143/104  Pulse: 79 82 79 81  Resp:      Temp: 98.2 F (36.8 C)  98.1 F (36.7 C) 98 F (36.7 C) 98.1 F (36.7 C)  TempSrc: Oral Oral Oral Oral  SpO2: 99% 95% 100% 99%  Weight: 120.8 kg   121.8 kg  Height:        Intake/Output Summary (Last 24 hours) at 07/05/2019 1119 Last data filed at 07/05/2019 1028 Gross per 24 hour  Intake 840 ml  Output 4075 ml  Net -3235 ml   Filed Weights   07/03/19 1420 07/04/19 0435 07/05/19 0406  Weight: 120.6 kg 120.8 kg 121.8 kg    Examination:  General exam: No acute distress. Respiratory system: Bilateral decreased breath sounds at bases.  No wheezing Cardiovascular system: Rate controlled, S1-S2 heard Gastrointestinal system: Abdomen is nondistended, soft and nontender. Normal bowel sounds heard. Extremities: No cyanosis, edema    Data Reviewed: I have personally reviewed following labs and imaging studies  CBC: Recent Labs  Lab 07/02/19 2053 07/03/19 0400 07/04/19 0426  WBC 9.0 5.8 4.6  NEUTROABS 4.9  --  1.5*  HGB 17.6* 15.8 16.1  HCT 51.4 47.8 47.1  MCV 87.0 87.4 86.3  PLT 295 210 204   Basic Metabolic Panel: Recent Labs  Lab 07/02/19 2053 07/03/19 0400 07/04/19 0426 07/05/19 0758  NA 136 137 136 139  K 3.8 3.6 3.8 3.5  CL 102 106 106 107  CO2 23 23 25 23   GLUCOSE 29* 79 138* 118*  BUN 25* 17 10 9   CREATININE 1.17 0.98 1.04 1.06  CALCIUM 9.2 8.4* 8.5* 9.2  MG  --   --  1.9  --  GFR: Estimated Creatinine Clearance: 130.4 mL/min (by C-G formula based on SCr of 1.06 mg/dL). Liver Function Tests: Recent Labs  Lab 07/02/19 2053 07/04/19 0426  AST 29 15  ALT 18 14  ALKPHOS 67 47  BILITOT 0.5 0.5  PROT 7.6 6.2*  ALBUMIN 4.0 3.0*   No results for input(s): LIPASE, AMYLASE in the last 168 hours. No results for input(s): AMMONIA in the last 168 hours. Coagulation Profile: No results for input(s): INR, PROTIME in the last 168 hours. Cardiac Enzymes: No results for input(s): CKTOTAL, CKMB, CKMBINDEX, TROPONINI in the last 168 hours. BNP (last 3 results) No results for  input(s): PROBNP in the last 8760 hours. HbA1C: Recent Labs    07/02/19 2053  HGBA1C 8.3*   CBG: Recent Labs  Lab 07/05/19 0403 07/05/19 0654 07/05/19 0715 07/05/19 0757 07/05/19 1027  GLUCAP 142* 106* 40* 104* 125*   Lipid Profile: No results for input(s): CHOL, HDL, LDLCALC, TRIG, CHOLHDL, LDLDIRECT in the last 72 hours. Thyroid Function Tests: Recent Labs    07/03/19 0400  TSH 0.378   Anemia Panel: No results for input(s): VITAMINB12, FOLATE, FERRITIN, TIBC, IRON, RETICCTPCT in the last 72 hours. Sepsis Labs: No results for input(s): PROCALCITON, LATICACIDVEN in the last 168 hours.  Recent Results (from the past 240 hour(s))  SARS CORONAVIRUS 2 (TAT 6-24 HRS) Nasopharyngeal Nasopharyngeal Swab     Status: None   Collection Time: 07/03/19 12:16 AM   Specimen: Nasopharyngeal Swab  Result Value Ref Range Status   SARS Coronavirus 2 NEGATIVE NEGATIVE Final    Comment: (NOTE) SARS-CoV-2 target nucleic acids are NOT DETECTED. The SARS-CoV-2 RNA is generally detectable in upper and lower respiratory specimens during the acute phase of infection. Negative results do not preclude SARS-CoV-2 infection, do not rule out co-infections with other pathogens, and should not be used as the sole basis for treatment or other patient management decisions. Negative results must be combined with clinical observations, patient history, and epidemiological information. The expected result is Negative. Fact Sheet for Patients: SugarRoll.be Fact Sheet for Healthcare Providers: https://www.woods-mathews.com/ This test is not yet approved or cleared by the [place] FDA and  has been authorized for detection and/or diagnosis of SARS-CoV-2 by FDA under an Emergency Use Authorization (EUA). This EUA will remain  in effect (meaning this test can be used) for the duration of the COVID-19 declaration under Section 56 4(b)(1) of the Act, 21 U.S.C.  section 360bbb-3(b)(1), unless the authorization is terminated or revoked sooner. Performed at [facility], [place] [address], [place], [place] [address]          Radiology Studies: No results found.      Scheduled Meds: . enoxaparin (LOVENOX) injection  40 mg Subcutaneous Q24H  . influenza vac split quadrivalent PF  0.5 mL Intramuscular Tomorrow-1000   Continuous Infusions:         Aline August, MD [facility] 07/05/2019, 11:19 AM

## 2019-07-05 NOTE — TOC Initial Note (Signed)
Transition of Care [facility]) - Initial/Assessment Note    Patient Details  Name: [name] [name] MRN: [id number] Date of Birth: [birth year]  Transition of Care [facility]) CM/SW Contact:    Bethena Roys, RN Phone Number: 07/05/2019, 10:59 AM  Clinical Narrative: Pt presented for hypoglycemia. Pt recently moved from [place] in June and plans to stay here in [place]. Currently living in [place] with family support. Patient listed as having Medicaid out of state. Patient has been using the [facility] Department for PCP needs. Verbal permission obtained to make a follow-up appointment with Royce Macadamia NP at the clinic. Appointment placed on the AVS. Pt states he just started a new job and gets medications from the [facility] [place]. CM will continue to follow for additional transition of care needs.                   Expected Discharge Plan: Home/Self Care Barriers to Discharge: No Barriers Identified   Patient Goals and CMS Choice Patient states their goals for this hospitalization and ongoing recovery are:: "to return home"   Choice offered to / list presented to : NA  Expected Discharge Plan and Services Expected Discharge Plan: Home/Self Care In-house Referral: NA   Post Acute Care Choice: NA Living arrangements for the past 2 months: Single Family Home    HH Arranged: NA    Prior Living Arrangements/Services Living arrangements for the past 2 months: Single Family Home Lives with:: Relatives Patient language and need for interpreter reviewed:: Yes Do you feel safe going back to the place where you live?: Yes      Need for Family Participation in Patient Care: Yes (Comment) Care giver support system in place?: Yes (comment)   Criminal Activity/Legal Involvement Pertinent to Current Situation/Hospitalization: No - Comment as needed  Activities of Daily Living Home Assistive Devices/Equipment: None ADL Screening (condition at  time of admission) Patient's cognitive ability adequate to safely complete daily activities?: Yes Is the patient deaf or have difficulty hearing?: No Does the patient have difficulty seeing, even when wearing glasses/contacts?: No Does the patient have difficulty concentrating, remembering, or making decisions?: No Patient able to express need for assistance with ADLs?: Yes Does the patient have difficulty dressing or bathing?: No Independently performs ADLs?: Yes (appropriate for developmental age) Does the patient have difficulty walking or climbing stairs?: No Weakness of Legs: None Weakness of Arms/Hands: None  Permission Sought/Granted Permission sought to share information with : Family Supports    Emotional Assessment Appearance:: Appears stated age Attitude/Demeanor/Rapport: Engaged Affect (typically observed): Appropriate Orientation: : Oriented to Self, Oriented to Place, Oriented to  Time, Oriented to Situation Alcohol / Substance Use: Not Applicable Psych Involvement: No (comment)  Admission diagnosis:  Hypoglycemia [E16.2] Patient Active Problem List   Diagnosis Date Noted  . Hypoglycemia 07/02/2019   PCP:  Patient, No Pcp Per Pharmacy:   [id number] [address] [place] [address] Phone: [phone] Fax: [phone]     Social Determinants of Health (SDOH) Interventions    Readmission Risk Interventions No flowsheet data found.

## 2019-07-06 LAB — BASIC METABOLIC PANEL
Anion gap: 9 (ref 5–15)
BUN: 11 mg/dL (ref 6–20)
CO2: 28 mmol/L (ref 22–32)
Calcium: 9.6 mg/dL (ref 8.9–10.3)
Chloride: 105 mmol/L (ref 98–111)
Creatinine, Ser: 1.04 mg/dL (ref 0.61–1.24)
GFR calc Af Amer: >60 mL/min (ref >60)
GFR calc non Af Amer: >60 mL/min (ref >60)
Glucose, Bld: 79 mg/dL (ref 70–99)
Potassium: 4.2 mmol/L (ref 3.5–5.1)
Sodium: 142 mmol/L (ref 135–145)

## 2019-07-06 LAB — GLUCOSE, CAPILLARY
Glucose-Capillary: 107 mg/dL — ABNORMAL HIGH (ref 70–99)
Glucose-Capillary: 123 mg/dL — ABNORMAL HIGH (ref 70–99)
Glucose-Capillary: 127 mg/dL — ABNORMAL HIGH (ref 70–99)
Glucose-Capillary: 150 mg/dL — ABNORMAL HIGH (ref 70–99)
Glucose-Capillary: 189 mg/dL — ABNORMAL HIGH (ref 70–99)

## 2019-07-06 MED ORDER — AMLODIPINE BESYLATE 10 MG PO TABS: 10.0000 mg | ORAL_TABLET | Freq: Every day | ORAL | 0 refills | Status: DC

## 2019-07-06 MED ORDER — ONDANSETRON HCL 4 MG PO TABS: 4.0000 mg | ORAL_TABLET | Freq: Four times a day (QID) | ORAL | 0 refills | Status: DC | PRN

## 2019-07-06 NOTE — Care Management (Signed)
1316 07-06-19 Late Entry: Patient stated he had no transportation home. CM was able to schedule [employer] to transport to [facility]. Patient was picked up by [employer] at 1314. No further needs from CM at this time. Bethena Roys, RN,BSN Case Manager [phone]

## 2019-07-06 NOTE — Progress Notes (Signed)
Inpatient Diabetes Program Recommendations  AACE/ADA: New Consensus Statement on Inpatient Glycemic Control (2015)  Target Ranges:  Prepandial:   less than 140 mg/dL      Peak postprandial:   less than 180 mg/dL (1-2 hours)      Critically ill patients:  140 - 180 mg/dL   Lab Results  Component Value Date   GLUCAP 150 (H) 07/06/2019   HGBA1C 8.3 (H) 07/02/2019    Review of Glycemic Control  Diabetes history: DM2 x 13 years Outpatient Diabetes medications: Basaglar 25-35 units QHS, Novolog 15-25 units tidwc Current orders for Inpatient glycemic control: None  HgbA1C 8.3%  Inpatient Diabetes Program Recommendations:     Spoke with pt at length regarding his diabetes and glucose control at home. Pt states blood sugars very labile, although states he's had frequent lows lately. Does not have glucose meter at home. Sister has been checking his blood sugars occasionally. Gets insulin from [facility] and has 2 pens in [place] at home ([place]). Stressed importance of obtaining meter and supplies at [employer] and checking blood sugars at least 4x/day to give MD as much information as possible regarding insulin needs and glucose control. Discussed diet and exercise, hypo s/s and treatment and f/u with MD at [facility] Dept. Pt states he will borrow money to get meter and supplies.   Will not be discharged on any insulin or OHAs. Discussed above with Dr. Starla Link and RN.  Thank you. Lorenda Peck, RD, LDN, CDE Inpatient Diabetes Coordinator [phone]

## 2019-07-06 NOTE — Discharge Summary (Signed)
Physician Discharge Summary  Barbette OrRyan A Franklyn XBJ:[id number] DOB: 1986-07-13 DOA: 07/02/2019  PCP: [name] [name], No Pcp Per  Admit date: 07/02/2019 Discharge date: 07/06/2019  Admitted From: Home Disposition: Home  Recommendations for Outpatient Follow-up:  1. Follow up with PCP in 1 week with repeat CBC/BMP 2. Outpatient follow-up with endocrinology 3. Follow up in ED if symptoms worsen or new appear   Home Health: No Equipment/Devices: None  Discharge Condition: Stable CODE STATUS: Full Diet recommendation: Heart healthy/carb modified diet  Brief/Interim Summary: 33 year old male with history of diabetes mellitus type 1 on Lantus presented with low blood sugar.  In the ER, [name] [name] was persistently hypoglycemic; blood glucose in the metabolic panel was 29.  He was started on dextrose drip.  COVID-19 was negative.  During the hospitalization, he had intermittent episodes of hypoglycemia despite being on dextrose drip.  Dextrose drip was continued.  Gradually, blood sugars improved, dextrose drip was discontinued on 07/05/2019.  His blood sugars have remained stable.  He will be discharged home today with outpatient follow-up with PCP and will need endocrinology evaluation and follow-up.  He will not be put on any insulin at the time of discharge.  Discharge Diagnoses:  Hypoglycemia in the setting of insulin use in a [name] [name] with diabetes mellitus type 1 -Blood glucose in the metabolic panel was 29 on presentation.    Treated with D10 drip.  Currently on D10 drip.   -During the hospitalization, he had intermittent episodes of hypoglycemia despite being on dextrose drip.  Dextrose drip was continued.  Gradually, blood sugars improved, dextrose drip was discontinued on 07/05/2019.  His blood sugars have remained stable. -A1c 8.3.  [name] [name] will not be discharged on any insulin upon discharge. -He will need outpatient endocrinology evaluation.  Consulted care management for arrangement for PCP as an  outpatient. -He will be discharged home today.  Hypertension -Blood pressure still intermittently elevated.    [name] [name] has been started on amlodipine.  We will continue amlodipine on discharge.  Outpatient follow-up with PCP.   Discharge Instructions  Discharge Instructions    Ambulatory referral to Endocrinology   Complete by: As directed    Recurrent hypoglycemia   Diet - low sodium heart healthy   Complete by: As directed    Diet Carb Modified   Complete by: As directed    Increase activity slowly   Complete by: As directed      Allergies as of 07/06/2019   No Known Allergies     Medication List    STOP taking these medications   insulin aspart 100 UNIT/ML FlexPen Commonly known as: NOVOLOG   Lantus SoloStar 100 UNIT/ML Solostar Pen Generic drug: Insulin Glargine     TAKE these medications   amLODipine 10 MG tablet Commonly known as: NORVASC Take 1 tablet (10 mg total) by mouth daily.   lidocaine 5 % Commonly known as: Lidoderm Place 1 patch onto the skin daily. Remove & Discard patch within 12 hours or as directed by MD   ondansetron 4 MG tablet Commonly known as: ZOFRAN Take 1 tablet (4 mg total) by mouth every 6 (six) hours as needed for nausea.      Follow-up Information    [facility].   Why: @ 3:30 pm for hospital follow up appointment with Kizzie Furnishochelle Muse NP. Bring proof of income- and wear a mask. Please take your discharge summary Contact information: [address] [place] [address] [phone]  [facility]. Schedule an appointment as soon as possible for a visit in 1 week(s).   Specialty: Internal Medicine Contact information: [address], [address] [place] [address] [phone]         No Known Allergies  Consultations:  Endocrinologist/Dr. Sharl Ma on phone on 07/05/2019   Procedures/Studies: Dg Lumbar Spine Complete  Result Date:  06/29/2019 CLINICAL DATA:  Low back pain for 1 month without known injury. EXAM: LUMBAR SPINE - COMPLETE 4+ VIEW COMPARISON:  None. FINDINGS: There is no evidence of lumbar spine fracture. Alignment is normal. Intervertebral disc spaces are maintained. IMPRESSION: Negative. Electronically Signed   By: Lupita Raider M.D.   On: 06/29/2019 08:38       Subjective: [name] [name] seen and examined at bedside.  He denies any overnight fever, nausea or vomiting.  Feels better and wants to go home today.  Discharge Exam: Vitals:   07/06/19 0427 07/06/19 1020  BP: (!) 161/105 (!) 160/105  Pulse: 90   Resp:    Temp: 98.2 F (36.8 C)   SpO2: 99%     General: Pt is alert, awake, not in acute distress Cardiovascular: rate controlled, S1/S2 + Respiratory: bilateral decreased breath sounds at bases Abdominal: Soft, NT, ND, bowel sounds + Extremities: no edema, no cyanosis    The results of significant diagnostics from this hospitalization (including imaging, microbiology, ancillary and laboratory) are listed below for reference.     Microbiology: Recent Results (from the past 240 hour(s))  SARS CORONAVIRUS 2 (TAT 6-24 HRS) Nasopharyngeal Nasopharyngeal Swab     Status: None   Collection Time: 07/03/19 12:16 AM   Specimen: Nasopharyngeal Swab  Result Value Ref Range Status   SARS Coronavirus 2 NEGATIVE NEGATIVE Final    Comment: (NOTE) SARS-CoV-2 target nucleic acids are NOT DETECTED. The SARS-CoV-2 RNA is generally detectable in upper and lower respiratory specimens during the acute phase of infection. Negative results do not preclude SARS-CoV-2 infection, do not rule out co-infections with other pathogens, and should not be used as the sole basis for treatment or other [name] [name] management decisions. Negative results must be combined with clinical observations, [name] [name] history, and epidemiological information. The expected result is Negative. Fact Sheet for  Patients: HairSlick.no Fact Sheet for Healthcare Providers: quierodirigir.com This test is not yet approved or cleared by the [place] FDA and  has been authorized for detection and/or diagnosis of SARS-CoV-2 by FDA under an Emergency Use Authorization (EUA). This EUA will remain  in effect (meaning this test can be used) for the duration of the COVID-19 declaration under Section 56 4(b)(1) of the Act, 21 U.S.C. section 360bbb-3(b)(1), unless the authorization is terminated or revoked sooner. Performed at [facility], [address], [place], [place] [address]      Labs: BNP (last 3 results) No results for input(s): BNP in the last 8760 hours. Basic Metabolic Panel: Recent Labs  Lab 07/02/19 2053 07/03/19 0400 07/04/19 0426 07/05/19 0758 07/06/19 0339  NA 136 137 136 139 142  K 3.8 3.6 3.8 3.5 4.2  CL 102 106 106 107 105  CO2 23 23 25 23 28   GLUCOSE 29* 79 138* 118* 79  BUN 25* 17 10 9 11   CREATININE 1.17 0.98 1.04 1.06 1.04  CALCIUM 9.2 8.4* 8.5* 9.2 9.6  MG  --   --  1.9  --   --    Liver Function Tests: Recent Labs  Lab 07/02/19 2053 07/04/19 0426  AST 29 15  ALT 18 14  ALKPHOS 67 47  BILITOT 0.5 0.5  PROT 7.6 6.2*  ALBUMIN 4.0 3.0*   No results for input(s): LIPASE, AMYLASE in the last 168 hours. No results for input(s): AMMONIA in the last 168 hours. CBC: Recent Labs  Lab 07/02/19 2053 07/03/19 0400 07/04/19 0426  WBC 9.0 5.8 4.6  NEUTROABS 4.9  --  1.5*  HGB 17.6* 15.8 16.1  HCT 51.4 47.8 47.1  MCV 87.0 87.4 86.3  PLT 295 210 204   Cardiac Enzymes: No results for input(s): CKTOTAL, CKMB, CKMBINDEX, TROPONINI in the last 168 hours. BNP: Invalid input(s): POCBNP CBG: Recent Labs  Lab 07/06/19 0010 07/06/19 0204 07/06/19 0424 07/06/19 0623 07/06/19 0752  GLUCAP 189* 107* 123* 127* 150*   D-Dimer No results for input(s): DDIMER in the last 72 hours. Hgb A1c No results  for input(s): HGBA1C in the last 72 hours. Lipid Profile No results for input(s): CHOL, HDL, LDLCALC, TRIG, CHOLHDL, LDLDIRECT in the last 72 hours. Thyroid function studies No results for input(s): TSH, T4TOTAL, T3FREE, THYROIDAB in the last 72 hours.  Invalid input(s): FREET3 Anemia work up No results for input(s): VITAMINB12, FOLATE, FERRITIN, TIBC, IRON, RETICCTPCT in the last 72 hours. Urinalysis    Component Value Date/Time   COLORURINE YELLOW 07/03/2019 0012   APPEARANCEUR CLEAR 07/03/2019 0012   LABSPEC 1.027 07/03/2019 0012   PHURINE 5.0 07/03/2019 0012   GLUCOSEU 50 (A) 07/03/2019 0012   HGBUR NEGATIVE 07/03/2019 0012   BILIRUBINUR NEGATIVE 07/03/2019 0012   KETONESUR NEGATIVE 07/03/2019 0012   PROTEINUR NEGATIVE 07/03/2019 0012   NITRITE NEGATIVE 07/03/2019 0012   LEUKOCYTESUR NEGATIVE 07/03/2019 0012   Sepsis Labs Invalid input(s): PROCALCITONIN,  WBC,  LACTICIDVEN Microbiology Recent Results (from the past 240 hour(s))  SARS CORONAVIRUS 2 (TAT 6-24 HRS) Nasopharyngeal Nasopharyngeal Swab     Status: None   Collection Time: 07/03/19 12:16 AM   Specimen: Nasopharyngeal Swab  Result Value Ref Range Status   SARS Coronavirus 2 NEGATIVE NEGATIVE Final    Comment: (NOTE) SARS-CoV-2 target nucleic acids are NOT DETECTED. The SARS-CoV-2 RNA is generally detectable in upper and lower respiratory specimens during the acute phase of infection. Negative results do not preclude SARS-CoV-2 infection, do not rule out co-infections with other pathogens, and should not be used as the sole basis for treatment or other [name] [name] management decisions. Negative results must be combined with clinical observations, [name] [name] history, and epidemiological information. The expected result is Negative. Fact Sheet for Patients: SugarRoll.be Fact Sheet for Healthcare Providers: https://www.woods-mathews.com/ This test is not yet approved or cleared  by the [place] FDA and  has been authorized for detection and/or diagnosis of SARS-CoV-2 by FDA under an Emergency Use Authorization (EUA). This EUA will remain  in effect (meaning this test can be used) for the duration of the COVID-19 declaration under Section 56 4(b)(1) of the Act, 21 U.S.C. section 360bbb-3(b)(1), unless the authorization is terminated or revoked sooner. Performed at [facility], [place] [address] [place], [place] [address]      Time coordinating discharge: 35 minutes  SIGNED:   Aline August, MD  [facility] 07/06/2019, 11:20 AM

## 2019-07-15 LAB — SULFONYLUREA HYPOGLYCEMICS PANEL, SERUM
Acetohexamide: NEGATIVE ug/mL (ref 20–60)
Chlorpropamide: NEGATIVE ug/mL (ref 75–250)
Glimepiride: NEGATIVE ng/mL (ref 80–250)
Glipizide: NEGATIVE ng/mL (ref 200–1000)
Glyburide: NEGATIVE ng/mL
Nateglinide: NEGATIVE ng/mL
Repaglinide: NEGATIVE ng/mL
Tolazamide: NEGATIVE ug/mL
Tolbutamide: NEGATIVE ug/mL (ref 40–100)

## 2019-08-08 ENCOUNTER — Other Ambulatory Visit: Payer: Self-pay

## 2019-08-08 ENCOUNTER — Emergency Department (HOSPITAL_COMMUNITY)
Admission: EM | Admit: 2019-08-08 | Discharge: 2019-08-08 | Disposition: A | Discharge status: Home / Self Care | Payer: Self-pay | Attending: Emergency Medicine | Admitting: Emergency Medicine

## 2019-08-08 DIAGNOSIS — R739 Hyperglycemia, unspecified: Secondary | ICD-10-CM

## 2019-08-08 DIAGNOSIS — Z794 Long term (current) use of insulin: Secondary | ICD-10-CM | POA: Insufficient documentation

## 2019-08-08 DIAGNOSIS — E860 Dehydration: Secondary | ICD-10-CM | POA: Insufficient documentation

## 2019-08-08 DIAGNOSIS — E1165 Type 2 diabetes mellitus with hyperglycemia: Secondary | ICD-10-CM | POA: Insufficient documentation

## 2019-08-08 DIAGNOSIS — E1369 Other specified diabetes mellitus with other specified complication: Secondary | ICD-10-CM

## 2019-08-08 DIAGNOSIS — F17210 Nicotine dependence, cigarettes, uncomplicated: Secondary | ICD-10-CM | POA: Insufficient documentation

## 2019-08-08 LAB — CBG MONITORING, ED
Glucose-Capillary: 507 mg/dL (ref 70–99)
Glucose-Capillary: 412 mg/dL — ABNORMAL HIGH (ref 70–99)
Glucose-Capillary: 281 mg/dL — ABNORMAL HIGH (ref 70–99)

## 2019-08-08 LAB — CBC
HCT: 50.5 % (ref 39.0–52.0)
Hemoglobin: 17.4 g/dL — ABNORMAL HIGH (ref 13.0–17.0)
MCH: 29.7 pg (ref 26.0–34.0)
MCHC: 34.5 g/dL (ref 30.0–36.0)
MCV: 86.2 fL (ref 80.0–100.0)
Platelets: 194 10*3/uL (ref 150–400)
RBC: 5.86 MIL/uL — ABNORMAL HIGH (ref 4.22–5.81)
RDW: 12.8 % (ref 11.5–15.5)
WBC: 5.5 10*3/uL (ref 4.0–10.5)
nRBC: 0 % (ref 0.0–0.2)

## 2019-08-08 LAB — BASIC METABOLIC PANEL
Anion gap: 9 (ref 5–15)
BUN: 13 mg/dL (ref 6–20)
CO2: 26 mmol/L (ref 22–32)
Calcium: 9.6 mg/dL (ref 8.9–10.3)
Chloride: 96 mmol/L — ABNORMAL LOW (ref 98–111)
Creatinine, Ser: 1.12 mg/dL (ref 0.61–1.24)
GFR calc Af Amer: >60 mL/min (ref >60)
GFR calc non Af Amer: >60 mL/min (ref >60)
Glucose, Bld: 545 mg/dL (ref 70–99)
Potassium: 4.5 mmol/L (ref 3.5–5.1)
Sodium: 131 mmol/L — ABNORMAL LOW (ref 135–145)

## 2019-08-08 MED ORDER — SODIUM CHLORIDE 0.9 % IV BOLUS (SEPSIS)
1000.0000 mL | Freq: Once | INTRAVENOUS | Status: AC
  Administered 2019-08-08: 1000 mL via INTRAVENOUS

## 2019-08-08 MED ORDER — LISINOPRIL 10 MG PO TABS: 10.0000 mg | ORAL_TABLET | Freq: Every day | ORAL | 0 refills | Status: DC

## 2019-08-08 MED ORDER — SODIUM CHLORIDE 0.9 % IV BOLUS
1000.0000 mL | Freq: Once | INTRAVENOUS | Status: AC
  Administered 2019-08-08: 1000 mL via INTRAVENOUS

## 2019-08-08 MED ORDER — INSULIN GLARGINE 100 UNIT/ML ~~LOC~~ SOLN: 35.0000 [IU] | Freq: Every day | SUBCUTANEOUS | 0 refills | Status: DC

## 2019-08-08 MED ORDER — AMLODIPINE BESYLATE 10 MG PO TABS: 10.0000 mg | ORAL_TABLET | Freq: Every day | ORAL | 0 refills | Status: DC

## 2019-08-08 MED ORDER — INSULIN ASPART 100 UNIT/ML ~~LOC~~ SOLN: 15.0000 [IU] | Freq: Three times a day (TID) | SUBCUTANEOUS | 0 refills | Status: DC

## 2019-08-08 MED ORDER — INSULIN ASPART 100 UNIT/ML ~~LOC~~ SOLN
12.0000 [IU] | Freq: Once | SUBCUTANEOUS | Status: AC
  Administered 2019-08-08: 12 [IU] via SUBCUTANEOUS
  Filled 2019-08-08: qty 1

## 2019-08-08 NOTE — ED Notes (Signed)
Pt asleep. Has no complaints at this time

## 2019-08-08 NOTE — ED Triage Notes (Signed)
Pt states that he has been in jail for the past 3 days he has not had his insulin and he thinks he is in DKA

## 2019-08-08 NOTE — ED Notes (Signed)
Pt getting dressed then to go to lobby to call for ride

## 2019-08-08 NOTE — ED Provider Notes (Signed)
[facility] Provider Note   CSN: [id number] Arrival date & time: 08/08/19  1402     History Chief Complaint  Patient presents with  . Hyperglycemia    [name] [name] is a 34 y.o. male.  The history is provided by the patient. No language interpreter was used.  Hyperglycemia Blood sugar level PTA:  500 Severity:  Moderate Onset quality:  Gradual Timing:  Constant Progression:  Worsening Chronicity:  New Diabetes status:  Controlled with insulin Context: noncompliance   Relieved by:  Nothing Ineffective treatments:  None tried Associated symptoms: nausea    Pt is concerned that he is in DKA.  Pt reports he was in jail for 3 days and was not given his insulin.  Pt reports he can't get his insulin from his home because of a restraining order.     Past Medical History:  Diagnosis Date  . Diabetes mellitus without complication [facility])     Patient Active Problem List   Diagnosis Date Noted  . Hypoglycemia 07/02/2019    Past Surgical History:  Procedure Laterality Date  . HAND SURGERY         Family History  Family history unknown: Yes    Social History   Tobacco Use  . Smoking status: Current Every Day Smoker    Packs/day: 0.50    Years: 10.00    Pack years: 5.00    Types: Cigarettes  . Smokeless tobacco: Former Network engineer Use Topics  . Alcohol use: Yes  . Drug use: Never    Home Medications Prior to Admission medications   Medication Sig Start Date End Date Taking? Authorizing Provider  amLODipine (NORVASC) 10 MG tablet Take 1 tablet (10 mg total) by mouth daily. 07/06/19  Yes Aline August, MD  insulin aspart (NOVOLOG) 100 UNIT/ML injection Inject 15 Units into the skin 3 (three) times daily before meals.   Yes [provider]  insulin glargine (LANTUS) 100 UNIT/ML injection Inject 35 Units into the skin at bedtime.    Yes [provider]  lisinopril (ZESTRIL) 10 MG tablet Take 10 mg by mouth daily.  06/17/19  Yes [provider]  lidocaine (LIDODERM) 5 % Place 1 patch onto the skin daily. Remove & Discard patch within 12 hours or as directed by MD 06/29/19   Nuala Alpha A, PA-C  ondansetron (ZOFRAN) 4 MG tablet Take 1 tablet (4 mg total) by mouth every 6 (six) hours as needed for nausea. 07/06/19   Aline August, MD    Allergies    Patient has no known allergies.  Review of Systems   Review of Systems  Gastrointestinal: Positive for nausea.  All other systems reviewed and are negative.   Physical Exam Updated Vital Signs BP 115/73 (BP Location: Left Arm)   Pulse (!) 101   Temp 98.4 F (36.9 C) (Oral)   Resp 18   Ht 5\' 11"  (1.803 m)   Wt 117.9 kg   SpO2 98%   BMI 36.26 kg/m   Physical Exam Vitals and nursing note reviewed.  Constitutional:      Appearance: He is well-developed.  HENT:     Head: Normocephalic and atraumatic.     Nose: Nose normal.     Mouth/Throat:     Mouth: Mucous membranes are moist.  Eyes:     Conjunctiva/sclera: Conjunctivae normal.  Cardiovascular:     Rate and Rhythm: Normal rate and regular rhythm.     Heart sounds: No murmur.  Pulmonary:  Effort: Pulmonary effort is normal. No respiratory distress.     Breath sounds: Normal breath sounds.  Abdominal:     General: Abdomen is flat.     Palpations: Abdomen is soft.     Tenderness: There is no abdominal tenderness.  Musculoskeletal:        General: Normal range of motion.     Cervical back: Neck supple.  Skin:    General: Skin is warm and dry.  Neurological:     General: No focal deficit present.     Mental Status: He is alert.  Psychiatric:        Mood and Affect: Mood normal.     ED Results / Procedures / Treatments   Labs (all labs ordered are listed, but only abnormal results are displayed) Labs Reviewed  BASIC METABOLIC PANEL - Abnormal; Notable for the following components:      Result Value   Sodium 131 (*)    Chloride 96 (*)    Glucose, Bld 545 (*)      All other components within normal limits  CBC - Abnormal; Notable for the following components:   RBC 5.86 (*)    Hemoglobin 17.4 (*)    All other components within normal limits  CBG MONITORING, ED - Abnormal; Notable for the following components:   Glucose-Capillary 507 (*)    All other components within normal limits  CBG MONITORING, ED - Abnormal; Notable for the following components:   Glucose-Capillary 412 (*)    All other components within normal limits  CBG MONITORING, ED - Abnormal; Notable for the following components:   Glucose-Capillary 281 (*)    All other components within normal limits  CBG MONITORING, ED    EKG None  Radiology No results found.  Procedures Procedures (including critical care time)  Medications Ordered in ED Medications  sodium chloride 0.9 % bolus 1,000 mL (0 mLs Intravenous Stopped 08/08/19 1634)    Followed by  sodium chloride 0.9 % bolus 1,000 mL (0 mLs Intravenous Stopped 08/08/19 1635)  insulin aspart (novoLOG) injection 12 Units (12 Units Subcutaneous Given 08/08/19 1634)  sodium chloride 0.9 % bolus 1,000 mL (1,000 mLs Intravenous New Bag/Given 08/08/19 1754)    ED Course  I have reviewed the triage vital signs and the nursing notes.  Pertinent labs & imaging results that were available during my care of the patient were reviewed by me and considered in my medical decision making (see chart for details).    MDM Rules/Calculators/A&P                      MDM:BMEt shows elevated glucose. Normal CO2 and normal anion gap.   Pt given Iv fluids x 3 liter,  Insulin subq.  Pt had decrease in glucose. Pt states he can not get his medications because he has a restraining order that he can not go to  Get them  Final Clinical Impression(s) / ED Diagnoses Final diagnoses:  Hyperglycemia  Dehydration  Other specified diabetes mellitus with other specified complication, with long-term current use of insulin (HCC)    Rx / DC Orders ED  Discharge Orders         Ordered    insulin glargine (LANTUS) 100 UNIT/ML injection  Daily at bedtime     08/08/19 1925    insulin aspart (NOVOLOG) 100 UNIT/ML injection  3 times daily before meals     08/08/19 1925    amLODipine (NORVASC) 10 MG tablet  Daily  08/08/19 1925    lisinopril (ZESTRIL) 10 MG tablet  Daily     08/08/19 1925        An After Visit Summary was printed and given to the patient.    Elson Areas, Cordelia Poche 08/08/19 Townsend Roger, MD 08/08/19 Corky Crafts

## 2019-08-08 NOTE — ED Notes (Signed)
Date and time results received: 08/08/19 4:16 PM    Test: glucose  Critical Value: 545  Name of Provider Notified: Cheron Schaumann   Orders Received? Or Actions Taken?:

## 2019-08-08 NOTE — Discharge Instructions (Addendum)
See your Physician for recheck.  °

## 2020-02-12 ENCOUNTER — Emergency Department (HOSPITAL_COMMUNITY): Payer: Self-pay

## 2020-02-12 ENCOUNTER — Encounter (HOSPITAL_COMMUNITY): Payer: Self-pay | Admitting: Emergency Medicine

## 2020-02-12 ENCOUNTER — Emergency Department (HOSPITAL_COMMUNITY)
Admission: EM | Admit: 2020-02-12 | Discharge: 2020-02-12 | Disposition: A | Discharge status: Home / Self Care | Payer: Self-pay | Attending: Emergency Medicine | Admitting: Emergency Medicine

## 2020-02-12 ENCOUNTER — Other Ambulatory Visit: Payer: Self-pay

## 2020-02-12 DIAGNOSIS — R079 Chest pain, unspecified: Secondary | ICD-10-CM

## 2020-02-12 DIAGNOSIS — I160 Hypertensive urgency: Secondary | ICD-10-CM

## 2020-02-12 DIAGNOSIS — R0602 Shortness of breath: Secondary | ICD-10-CM | POA: Insufficient documentation

## 2020-02-12 DIAGNOSIS — R0789 Other chest pain: Secondary | ICD-10-CM | POA: Insufficient documentation

## 2020-02-12 DIAGNOSIS — Z794 Long term (current) use of insulin: Secondary | ICD-10-CM | POA: Insufficient documentation

## 2020-02-12 DIAGNOSIS — F17210 Nicotine dependence, cigarettes, uncomplicated: Secondary | ICD-10-CM | POA: Insufficient documentation

## 2020-02-12 DIAGNOSIS — Z79899 Other long term (current) drug therapy: Secondary | ICD-10-CM | POA: Insufficient documentation

## 2020-02-12 DIAGNOSIS — E119 Type 2 diabetes mellitus without complications: Secondary | ICD-10-CM | POA: Insufficient documentation

## 2020-02-12 LAB — BASIC METABOLIC PANEL
Anion gap: 11 (ref 5–15)
BUN: 10 mg/dL (ref 6–20)
CO2: 26 mmol/L (ref 22–32)
Calcium: 9.2 mg/dL (ref 8.9–10.3)
Chloride: 106 mmol/L (ref 98–111)
Creatinine, Ser: 1.15 mg/dL (ref 0.61–1.24)
GFR calc Af Amer: >60 mL/min (ref >60)
GFR calc non Af Amer: >60 mL/min (ref >60)
Glucose, Bld: 98 mg/dL (ref 70–99)
Potassium: 3.4 mmol/L — ABNORMAL LOW (ref 3.5–5.1)
Sodium: 143 mmol/L (ref 135–145)

## 2020-02-12 LAB — BRAIN NATRIURETIC PEPTIDE: B Natriuretic Peptide: 61 pg/mL (ref 0.0–100.0)

## 2020-02-12 LAB — RAPID URINE DRUG SCREEN, HOSP PERFORMED
Amphetamines: POSITIVE — AB
Barbiturates: NOT DETECTED
Benzodiazepines: NOT DETECTED
Cocaine: NOT DETECTED
Opiates: NOT DETECTED
Tetrahydrocannabinol: NOT DETECTED

## 2020-02-12 LAB — TROPONIN I (HIGH SENSITIVITY)
Troponin I (High Sensitivity): 5 ng/L (ref <18)
Troponin I (High Sensitivity): 6 ng/L (ref <18)

## 2020-02-12 LAB — CBC
HCT: 47.5 % (ref 39.0–52.0)
Hemoglobin: 15.8 g/dL (ref 13.0–17.0)
MCH: 28.8 pg (ref 26.0–34.0)
MCHC: 33.3 g/dL (ref 30.0–36.0)
MCV: 86.5 fL (ref 80.0–100.0)
Platelets: 235 10*3/uL (ref 150–400)
RBC: 5.49 MIL/uL (ref 4.22–5.81)
RDW: 13.4 % (ref 11.5–15.5)
WBC: 7.6 10*3/uL (ref 4.0–10.5)
nRBC: 0 % (ref 0.0–0.2)

## 2020-02-12 MED ORDER — NITROGLYCERIN 0.4 MG SL SUBL
0.4000 mg | SUBLINGUAL_TABLET | Freq: Once | SUBLINGUAL | Status: AC
  Administered 2020-02-12: 0.4 mg via SUBLINGUAL
  Filled 2020-02-12: qty 1

## 2020-02-12 MED ORDER — INSULIN GLARGINE 100 UNIT/ML ~~LOC~~ SOLN: 35.0000 [IU] | Freq: Every day | SUBCUTANEOUS | 0 refills | Status: DC

## 2020-02-12 MED ORDER — AMLODIPINE BESYLATE 5 MG PO TABS
10.0000 mg | ORAL_TABLET | Freq: Once | ORAL | Status: AC
  Administered 2020-02-12: 10 mg via ORAL
  Filled 2020-02-12: qty 2

## 2020-02-12 MED ORDER — ASPIRIN 81 MG PO CHEW
324.0000 mg | CHEWABLE_TABLET | Freq: Once | ORAL | Status: AC
  Administered 2020-02-12: 324 mg via ORAL
  Filled 2020-02-12: qty 4

## 2020-02-12 MED ORDER — IOHEXOL 350 MG/ML SOLN
80.0000 mL | Freq: Once | INTRAVENOUS | Status: AC | PRN
  Administered 2020-02-12: 80 mL via INTRAVENOUS

## 2020-02-12 MED ORDER — LISINOPRIL 10 MG PO TABS
10.0000 mg | ORAL_TABLET | Freq: Once | ORAL | Status: AC
  Administered 2020-02-12: 10 mg via ORAL
  Filled 2020-02-12: qty 1

## 2020-02-12 MED ORDER — INSULIN ASPART 100 UNIT/ML ~~LOC~~ SOLN: 15.0000 [IU] | Freq: Three times a day (TID) | SUBCUTANEOUS | 0 refills | Status: DC

## 2020-02-12 MED ORDER — AMLODIPINE BESYLATE 10 MG PO TABS: 10.0000 mg | ORAL_TABLET | Freq: Every day | ORAL | 0 refills | Status: DC

## 2020-02-12 MED ORDER — SODIUM CHLORIDE 0.9% FLUSH
3.0000 mL | Freq: Once | INTRAVENOUS | Status: AC
  Administered 2020-02-12: 3 mL via INTRAVENOUS

## 2020-02-12 MED ORDER — LISINOPRIL 10 MG PO TABS: 10.0000 mg | ORAL_TABLET | Freq: Every day | ORAL | 0 refills | Status: DC

## 2020-02-12 MED ORDER — METOPROLOL TARTRATE 5 MG/5ML IV SOLN
5.0000 mg | Freq: Once | INTRAVENOUS | Status: AC
  Administered 2020-02-12: 5 mg via INTRAVENOUS
  Filled 2020-02-12: qty 5

## 2020-02-12 NOTE — ED Notes (Signed)
Pt was informed that we need a urine sample. Pt has urinal at bedside.  

## 2020-02-12 NOTE — ED Triage Notes (Signed)
Pt c/o chest pain since 2300, sob x 3days. Per ems pt is hypertensive.

## 2020-02-12 NOTE — ED Notes (Signed)
Pt asking when he can go home.  Informed that tests are still in progress and new test were ordered.

## 2020-02-12 NOTE — ED Provider Notes (Signed)
Tests negative, stable for d/c   Eber Hong, MD 02/12/20 [phone]

## 2020-02-12 NOTE — Discharge Instructions (Addendum)
Take your blood pressure medication and diabetes medication as prescribed.  Establish care with a primary doctor.  If you do not take care of your blood pressure and diabetes you are putting yourself at risk for long-term damage to your eyes, brain, heart, lungs and kidneys.  Your tests were normal

## 2020-02-12 NOTE — ED Provider Notes (Signed)
We think [facility] EMERGENCY DEPARTMENT Provider Note   CSN: [id number] Arrival date & time: 02/12/20  0449     History Chief Complaint  Patient presents with  . Chest Pain    [name] [name] is a 34 y.o. male.  Patient with hypertension and diabetes noncompliant with medications for the past 1 week.  Here with chest pain that onset about 11:00 last night that has been constant.  Pain is in the center of his chest and radiates to his back and is associate with some shortness of breath.  Hypertensive and tachycardic for EMS.  Has not had his medications for 1 week.  The pain is sharp and constant.  There are some associated shortness of breath.  No cough or fever.  No sweating.  No nausea or vomiting.  No abdominal pain or back pain.  Denies any cardiac history.  He is never had a heart attack.  No stents in the heart.  Denies any drug use or cocaine use.  The history is provided by the patient.  Chest Pain Associated symptoms: shortness of breath   Associated symptoms: no abdominal pain, no dizziness, no headache, no nausea, no vomiting and no weakness        Past Medical History:  Diagnosis Date  . Diabetes mellitus without complication [facility])     Patient Active Problem List   Diagnosis Date Noted  . Hypoglycemia 07/02/2019    Past Surgical History:  Procedure Laterality Date  . HAND SURGERY         Family History  Family history unknown: Yes    Social History   Tobacco Use  . Smoking status: Current Every Day Smoker    Packs/day: 0.50    Years: 10.00    Pack years: 5.00    Types: Cigarettes  . Smokeless tobacco: Former Clinical biochemist  . Vaping Use: Never used  Substance Use Topics  . Alcohol use: Yes  . Drug use: Never    Home Medications Prior to Admission medications   Medication Sig Start Date End Date Taking? Authorizing Provider  amLODipine (NORVASC) 10 MG tablet Take 1 tablet (10 mg total) by mouth daily. 08/08/19   Elson Areas, PA-C    insulin aspart (NOVOLOG) 100 UNIT/ML injection Inject 15 Units into the skin 3 (three) times daily before meals. 08/08/19   Elson Areas, PA-C  insulin glargine (LANTUS) 100 UNIT/ML injection Inject 0.35 mLs (35 Units total) into the skin at bedtime. 08/08/19   Elson Areas, PA-C  lidocaine (LIDODERM) 5 % Place 1 patch onto the skin daily. Remove & Discard patch within 12 hours or as directed by MD 06/29/19   Harlene Salts A, PA-C  lisinopril (ZESTRIL) 10 MG tablet Take 1 tablet (10 mg total) by mouth daily. 08/08/19   Elson Areas, PA-C  ondansetron (ZOFRAN) 4 MG tablet Take 1 tablet (4 mg total) by mouth every 6 (six) hours as needed for nausea. 07/06/19   Glade Lloyd, MD    Allergies    Patient has no known allergies.  Review of Systems   Review of Systems  Constitutional: Negative for activity change and appetite change.  HENT: Negative for congestion and rhinorrhea.   Eyes: Negative for visual disturbance.  Respiratory: Positive for chest tightness and shortness of breath.   Cardiovascular: Positive for chest pain.  Gastrointestinal: Negative for abdominal pain, nausea and vomiting.  Genitourinary: Negative for dysuria and hematuria.  Musculoskeletal: Negative for arthralgias and myalgias.  Skin: Negative for rash.  Neurological: Negative for dizziness, weakness and headaches.   all other systems are negative except as noted in the HPI and PMH.    Physical Exam Updated Vital Signs BP (!) 162/119 (BP Location: Right Arm)   Pulse (!) 116   Temp 99.1 F (37.3 C) (Oral)   Resp (!) 23   Ht 5\' 11"  (1.803 m)   Wt 120.2 kg   SpO2 96%   BMI 36.96 kg/m   Physical Exam Vitals and nursing note reviewed.  Constitutional:      General: He is not in acute distress.    Appearance: Normal appearance. He is well-developed and normal weight. He is not ill-appearing.  HENT:     Head: Normocephalic and atraumatic.     Mouth/Throat:     Pharynx: No oropharyngeal exudate.   Eyes:     Conjunctiva/sclera: Conjunctivae normal.     Pupils: Pupils are equal, round, and reactive to light.  Neck:     Comments: No meningismus. Cardiovascular:     Rate and Rhythm: Regular rhythm. Tachycardia present.     Heart sounds: Normal heart sounds. No murmur heard.   Pulmonary:     Effort: Pulmonary effort is normal. No respiratory distress.     Breath sounds: Normal breath sounds.  Abdominal:     Palpations: Abdomen is soft.     Tenderness: There is no abdominal tenderness. There is no guarding or rebound.  Musculoskeletal:        General: No tenderness. Normal range of motion.     Cervical back: Normal range of motion and neck supple.  Skin:    General: Skin is warm.     Capillary Refill: Capillary refill takes less than 2 seconds.  Neurological:     General: No focal deficit present.     Mental Status: He is alert and oriented to person, place, and time. Mental status is at baseline.     Cranial Nerves: No cranial nerve deficit.     Motor: No abnormal muscle tone.     Coordination: Coordination normal.     Comments:  5/5 strength throughout. CN 2-12 intact.Equal grip strength.   Psychiatric:        Behavior: Behavior normal.     ED Results / Procedures / Treatments   Labs (all labs ordered are listed, but only abnormal results are displayed) Labs Reviewed  BASIC METABOLIC PANEL - Abnormal; Notable for the following components:      Result Value   Potassium 3.4 (*)    All other components within normal limits  CBC  BRAIN NATRIURETIC PEPTIDE  RAPID URINE DRUG SCREEN, HOSP PERFORMED  TROPONIN I (HIGH SENSITIVITY)  TROPONIN I (HIGH SENSITIVITY)    EKG EKG Interpretation  Date/Time:  Saturday February 12 2020 04:54:14 EDT Ventricular Rate:  115 PR Interval:    QRS Duration: 112 QT Interval:  339 QTC Calculation: 469 R Axis:   -41 Text Interpretation: Sinus tachycardia Incomplete RBBB and LAFB ST elev, probable normal early repol pattern Borderline  prolonged QT interval Rate faster Confirmed by 05[birth year]20[birth year]1991 [phone]) on 02/12/2020 5:11:22 AM   Radiology DG Chest 2 View  Result Date: 02/12/2020 CLINICAL DATA:  Chest pain.  Shortness of breath. EXAM: CHEST - 2 VIEW COMPARISON:  06/05/2008 FINDINGS: Midline trachea.  Normal heart size and mediastinal contours. Sharp costophrenic angles.  No pneumothorax.  Clear lungs. Numerous leads and wires project over the chest. IMPRESSION: No active cardiopulmonary disease. Electronically Signed  By: Jeronimo Greaves M.D.   On: 02/12/2020 06:29    Procedures .Critical Care Performed by: Glynn Octave, MD Authorized by: Glynn Octave, MD   Critical care provider statement:    Critical care time (minutes):  35   Critical care was necessary to treat or prevent imminent or life-threatening deterioration of the following conditions: hypertensive urgency.   Critical care was time spent personally by me on the following activities:  Discussions with consultants, evaluation of patient's response to treatment, examination of patient, ordering and performing treatments and interventions, ordering and review of laboratory studies, ordering and review of radiographic studies, pulse oximetry, re-evaluation of patient's condition, obtaining history from patient or surrogate and review of old charts   (including critical care time)  Medications Ordered in ED Medications  sodium chloride flush (NS) 0.9 % injection 3 mL (has no administration in time range)  aspirin chewable tablet 324 mg (has no administration in time range)  nitroGLYCERIN (NITROSTAT) SL tablet 0.4 mg (has no administration in time range)  metoprolol tartrate (LOPRESSOR) injection 5 mg (has no administration in time range)    ED Course  I have reviewed the triage vital signs and the nursing notes.  Pertinent labs & imaging results that were available during my care of the patient were reviewed by me and considered in my medical decision  making (see chart for details).    MDM Rules/Calculators/A&P                          Constant central chest pain no shortness of breath for the past 6 hours.  EKG is sinus tachycardia with right bundle branch block.  He is tachycardic and hypertensive.  Patient given aspirin as well as nitroglycerin and IV metoprolol.  His EKG shows no acute ischemia.  Initial troponin is negative.  Chest x-ray is negative.  Chest pain is improved after the above measures.  Tachycardia has improved to the 90s. With ongoing pain rating to his back will obtain CT angiogram to rule out aortic dissection.  Will need second troponin in setting of hypertensive urgency. Patient will need refills of his blood pressure and diabetes medications which were provided. Stressed need for compliance with his diabetes and blood pressure medication.  Care will be transferred at shift change to Dr. Hyacinth Meeker. Final Clinical Impression(s) / ED Diagnoses Final diagnoses:  None    Rx / DC Orders ED Discharge Orders    None       Avy Barlett, Jeannett Senior, MD 02/12/20 [phone]

## 2020-03-22 MED FILL — !NOVOLOG FLEXPEN SYRINGE 1: 100/ML | 13 days supply | Qty: 6 | Fill #0

## 2020-03-22 MED FILL — !LANTUS SOLOSTAR 100UNITS/M: 100 | 24 days supply | Qty: 6 | Fill #0

## 2021-08-28 ENCOUNTER — Other Ambulatory Visit: Payer: Self-pay

## 2021-08-28 ENCOUNTER — Emergency Department (HOSPITAL_COMMUNITY): Payer: Self-pay

## 2021-08-28 ENCOUNTER — Emergency Department (HOSPITAL_COMMUNITY)
Admission: EM | Admit: 2021-08-28 | Discharge: 2021-08-28 | Disposition: A | Discharge status: Home / Self Care | Payer: Self-pay | Attending: Emergency Medicine | Admitting: Emergency Medicine

## 2021-08-28 ENCOUNTER — Encounter (HOSPITAL_COMMUNITY): Payer: Self-pay

## 2021-08-28 DIAGNOSIS — Z794 Long term (current) use of insulin: Secondary | ICD-10-CM | POA: Insufficient documentation

## 2021-08-28 DIAGNOSIS — Z79899 Other long term (current) drug therapy: Secondary | ICD-10-CM | POA: Insufficient documentation

## 2021-08-28 DIAGNOSIS — Z20822 Contact with and (suspected) exposure to covid-19: Secondary | ICD-10-CM | POA: Insufficient documentation

## 2021-08-28 DIAGNOSIS — E119 Type 2 diabetes mellitus without complications: Secondary | ICD-10-CM | POA: Insufficient documentation

## 2021-08-28 DIAGNOSIS — R0789 Other chest pain: Secondary | ICD-10-CM | POA: Insufficient documentation

## 2021-08-28 DIAGNOSIS — R0602 Shortness of breath: Secondary | ICD-10-CM | POA: Insufficient documentation

## 2021-08-28 DIAGNOSIS — I10 Essential (primary) hypertension: Secondary | ICD-10-CM | POA: Insufficient documentation

## 2021-08-28 DIAGNOSIS — R079 Chest pain, unspecified: Secondary | ICD-10-CM

## 2021-08-28 LAB — RAPID URINE DRUG SCREEN, HOSP PERFORMED
Amphetamines: POSITIVE — AB
Barbiturates: NOT DETECTED
Benzodiazepines: NOT DETECTED
Cocaine: POSITIVE — AB
Opiates: POSITIVE — AB
Tetrahydrocannabinol: NOT DETECTED

## 2021-08-28 LAB — COMPREHENSIVE METABOLIC PANEL
ALT: 20 U/L (ref 0–44)
AST: 24 U/L (ref 15–41)
Albumin: 3.8 g/dL (ref 3.5–5.0)
Alkaline Phosphatase: 60 U/L (ref 38–126)
Anion gap: 7 (ref 5–15)
BUN: 12 mg/dL (ref 6–20)
CO2: 26 mmol/L (ref 22–32)
Calcium: 8.9 mg/dL (ref 8.9–10.3)
Chloride: 102 mmol/L (ref 98–111)
Creatinine, Ser: 1.02 mg/dL (ref 0.61–1.24)
GFR, Estimated: >60 mL/min (ref >60)
Glucose, Bld: 200 mg/dL — ABNORMAL HIGH (ref 70–99)
Potassium: 4.6 mmol/L (ref 3.5–5.1)
Sodium: 135 mmol/L (ref 135–145)
Total Bilirubin: 1 mg/dL (ref 0.3–1.2)
Total Protein: 7.4 g/dL (ref 6.5–8.1)

## 2021-08-28 LAB — CBC WITH DIFFERENTIAL/PLATELET
Abs Immature Granulocytes: 0.02 10*3/uL (ref 0.00–0.07)
Basophils Absolute: 0 10*3/uL (ref 0.0–0.1)
Basophils Relative: 1 %
Eosinophils Absolute: 0.1 10*3/uL (ref 0.0–0.5)
Eosinophils Relative: 1 %
HCT: 49.3 % (ref 39.0–52.0)
Hemoglobin: 16.6 g/dL (ref 13.0–17.0)
Immature Granulocytes: 0 %
Lymphocytes Relative: 42 %
Lymphs Abs: 2.1 10*3/uL (ref 0.7–4.0)
MCH: 28.9 pg (ref 26.0–34.0)
MCHC: 33.7 g/dL (ref 30.0–36.0)
MCV: 85.7 fL (ref 80.0–100.0)
Monocytes Absolute: 0.6 10*3/uL (ref 0.1–1.0)
Monocytes Relative: 12 %
Neutro Abs: 2.2 10*3/uL (ref 1.7–7.7)
Neutrophils Relative %: 44 %
Platelets: 204 10*3/uL (ref 150–400)
RBC: 5.75 MIL/uL (ref 4.22–5.81)
RDW: 12.6 % (ref 11.5–15.5)
WBC: 5 10*3/uL (ref 4.0–10.5)
nRBC: 0 % (ref 0.0–0.2)

## 2021-08-28 LAB — URINALYSIS, ROUTINE W REFLEX MICROSCOPIC
Bilirubin Urine: NEGATIVE
Glucose, UA: 250 mg/dL — AB
Hgb urine dipstick: NEGATIVE
Ketones, ur: NEGATIVE mg/dL
Leukocytes,Ua: NEGATIVE
Nitrite: NEGATIVE
Protein, ur: NEGATIVE mg/dL
Specific Gravity, Urine: 1.02 (ref 1.005–1.030)
pH: 7 (ref 5.0–8.0)

## 2021-08-28 LAB — TROPONIN I (HIGH SENSITIVITY)
Troponin I (High Sensitivity): 5 ng/L (ref <18)
Troponin I (High Sensitivity): 4 ng/L (ref <18)

## 2021-08-28 LAB — RESP PANEL BY RT-PCR (FLU A&B, COVID) ARPGX2
Influenza A by PCR: NEGATIVE
Influenza B by PCR: NEGATIVE
SARS Coronavirus 2 by RT PCR: NEGATIVE

## 2021-08-28 LAB — PROTIME-INR
INR: 0.9 (ref 0.8–1.2)
Prothrombin Time: 12.2 seconds (ref 11.4–15.2)

## 2021-08-28 LAB — BRAIN NATRIURETIC PEPTIDE: B Natriuretic Peptide: 9 pg/mL (ref 0.0–100.0)

## 2021-08-28 LAB — CBG MONITORING, ED: Glucose-Capillary: 218 mg/dL — ABNORMAL HIGH (ref 70–99)

## 2021-08-28 LAB — LIPASE, BLOOD: Lipase: 38 U/L (ref 11–51)

## 2021-08-28 LAB — MAGNESIUM: Magnesium: 2.2 mg/dL (ref 1.7–2.4)

## 2021-08-28 MED ORDER — IOHEXOL 350 MG/ML SOLN
100.0000 mL | Freq: Once | INTRAVENOUS | Status: AC | PRN
  Administered 2021-08-28: 100 mL via INTRAVENOUS

## 2021-08-28 MED ORDER — DIAZEPAM 5 MG/ML IJ SOLN
5.0000 mg | Freq: Once | INTRAMUSCULAR | Status: AC
  Administered 2021-08-28: 5 mg via INTRAVENOUS
  Filled 2021-08-28: qty 2

## 2021-08-28 MED ORDER — NITROGLYCERIN 2 % TD OINT
1.0000 [in_us] | TOPICAL_OINTMENT | Freq: Once | TRANSDERMAL | Status: AC
  Administered 2021-08-28: 1 [in_us] via TOPICAL
  Filled 2021-08-28: qty 1

## 2021-08-28 MED ORDER — MORPHINE SULFATE (PF) 4 MG/ML IV SOLN
4.0000 mg | Freq: Once | INTRAVENOUS | Status: AC
Start: 2021-08-28 — End: 2021-08-28
  Administered 2021-08-28: 4 mg via INTRAVENOUS
  Filled 2021-08-28: qty 1

## 2021-08-28 MED ORDER — ASPIRIN 81 MG PO CHEW
324.0000 mg | CHEWABLE_TABLET | Freq: Once | ORAL | Status: AC
  Administered 2021-08-28: 324 mg via ORAL
  Filled 2021-08-28: qty 4

## 2021-08-28 NOTE — ED Notes (Signed)
Ambulated to bathroom with steady gait

## 2021-08-28 NOTE — ED Notes (Signed)
Pt states would like to sign out AMA, Dr Durwin Nora made aware.

## 2021-08-28 NOTE — ED Notes (Signed)
Patient transported to CT 

## 2021-08-28 NOTE — ED Provider Notes (Signed)
[facility] EMERGENCY DEPARTMENT Provider Note   CSN: [id number] Arrival date & time: 08/28/21  0800     History  Chief Complaint  Patient presents with   Chest Pain    [name] [name] is a 36 y.o. male.  HPI Patient presents for chest pain.  Medical history is notable for DM, HTN.  He was seen in the ED 11 days ago for hyperglycemia.  This was reportedly due to not having insulin following recent release from prison.  Today, he reports left-sided chest pain since last night.  Pain is worsened with movement and deep inspiration.  He has no known cardiac history. HPI: A 36 year old patient with a history of treated diabetes, hypertension and obesity presents for evaluation of chest pain. Initial onset of pain was less than one hour ago. The patient's chest pain is described as heaviness/pressure/tightness and is worse with exertion. The patient complains of nausea. The patient's chest pain is middle- or left-sided, is not well-localized, is not sharp and does radiate to the arms/jaw/neck. The patient denies diaphoresis. The patient has smoked in the past 90 days and has a family history of coronary artery disease in a first-degree relative with onset less than age 79. The patient has no history of stroke, has no history of peripheral artery disease and has no history of hypercholesterolemia.   Home Medications Prior to Admission medications   Medication Sig Start Date End Date Taking? Authorizing Provider  amLODipine (NORVASC) 10 MG tablet Take 1 tablet (10 mg total) by mouth daily. 02/12/20   Rancour, Jeannett Senior, MD  insulin aspart (NOVOLOG) 100 UNIT/ML injection Inject 15 Units into the skin 3 (three) times daily before meals. 02/12/20   Rancour, Jeannett Senior, MD  insulin glargine (LANTUS) 100 UNIT/ML injection Inject 0.35 mLs (35 Units total) into the skin at bedtime. 02/12/20   Rancour, Jeannett Senior, MD  lidocaine (LIDODERM) 5 % Place 1 patch onto the skin daily. Remove & Discard patch within 12 hours  or as directed by MD 06/29/19   Harlene Salts A, PA-C  lisinopril (ZESTRIL) 10 MG tablet Take 1 tablet (10 mg total) by mouth daily. 02/12/20   Rancour, Jeannett Senior, MD  ondansetron (ZOFRAN) 4 MG tablet Take 1 tablet (4 mg total) by mouth every 6 (six) hours as needed for nausea. 07/06/19   Glade Lloyd, MD      Allergies    Patient has no known allergies.    Review of Systems   Review of Systems  Respiratory:  Positive for chest tightness.   Cardiovascular:  Positive for chest pain.  Gastrointestinal:  Positive for nausea.  Musculoskeletal:  Positive for myalgias.  Neurological:  Positive for headaches.  All other systems reviewed and are negative.  Physical Exam Updated Vital Signs BP 138/78    Pulse 87    Temp 98.2 F (36.8 C) (Oral)    Resp 18    Ht 5\' 11"  (1.803 m)    Wt 99.8 kg    SpO2 99%    BMI 30.68 kg/m  Physical Exam Vitals and nursing note reviewed.  Constitutional:      General: He is not in acute distress.    Appearance: He is well-developed. He is not ill-appearing, toxic-appearing or diaphoretic.  HENT:     Head: Normocephalic and atraumatic.  Eyes:     Extraocular Movements: Extraocular movements intact.     Conjunctiva/sclera: Conjunctivae normal.  Neck:     Vascular: No JVD.  Cardiovascular:     Rate and Rhythm:  Normal rate and regular rhythm.     Heart sounds: No murmur heard. Pulmonary:     Effort: Pulmonary effort is normal. No respiratory distress.     Breath sounds: Normal breath sounds. No decreased breath sounds, wheezing or rales.  Abdominal:     Palpations: Abdomen is soft.     Tenderness: There is no abdominal tenderness.  Musculoskeletal:        General: No swelling.     Cervical back: Normal range of motion and neck supple.     Right lower leg: No edema.     Left lower leg: No edema.  Skin:    General: Skin is warm and dry.     Capillary Refill: Capillary refill takes less than 2 seconds.  Neurological:     General: No focal deficit  present.     Mental Status: He is alert and oriented to person, place, and time.     Cranial Nerves: No cranial nerve deficit.     Motor: No weakness.  Psychiatric:        Mood and Affect: Mood normal.        Behavior: Behavior normal.  36 year old male with history of  ED Results / Procedures / Treatments   Labs (all labs ordered are listed, but only abnormal results are displayed) Labs Reviewed  COMPREHENSIVE METABOLIC PANEL - Abnormal; Notable for the following components:      Result Value   Glucose, Bld 200 (*)    All other components within normal limits  URINALYSIS, ROUTINE W REFLEX MICROSCOPIC - Abnormal; Notable for the following components:   Glucose, UA 250 (*)    All other components within normal limits  RAPID URINE DRUG SCREEN, HOSP PERFORMED - Abnormal; Notable for the following components:   Opiates POSITIVE (*)    Cocaine POSITIVE (*)    Amphetamines POSITIVE (*)    All other components within normal limits  CBG MONITORING, ED - Abnormal; Notable for the following components:   Glucose-Capillary 218 (*)    All other components within normal limits  RESP PANEL BY RT-PCR (FLU A&B, COVID) ARPGX2  MAGNESIUM  LIPASE, BLOOD  BRAIN NATRIURETIC PEPTIDE  CBC WITH DIFFERENTIAL/PLATELET  PROTIME-INR  TROPONIN I (HIGH SENSITIVITY)  TROPONIN I (HIGH SENSITIVITY)    EKG EKG Interpretation  Date/Time:  Tuesday August 28 2021 08:19:50 EST Ventricular Rate:  87 PR Interval:  141 QRS Duration: 111 QT Interval:  369 QTC Calculation: 444 R Axis:   205 Text Interpretation: Sinus rhythm Right axis deviation Low voltage, precordial leads Confirmed by Gloris Manchesterixon, [place] ([id number]) on 08/28/2021 8:56:08 AM  Radiology CT ANGIO CHEST PE W OR WO CONTRAST  Result Date: 08/28/2021 CLINICAL DATA:  High probability for pulmonary embolism. Left-sided chest pain beginning last night. EXAM: CT ANGIOGRAPHY CHEST WITH CONTRAST TECHNIQUE: Multidetector CT imaging of the chest was performed using  the standard protocol during bolus administration of intravenous contrast. Multiplanar CT image reconstructions and MIPs were obtained to evaluate the vascular anatomy. RADIATION DOSE REDUCTION: This exam was performed according to the departmental dose-optimization program which includes automated exposure control, adjustment of the mA and/or kV according to patient size and/or use of iterative reconstruction technique. CONTRAST:  100mL OMNIPAQUE IOHEXOL 350 MG/ML SOLN COMPARISON:  CT of the chest abdomen and pelvis February 12, 2020 FINDINGS: Cardiovascular: Evaluation for pulmonary emboli is limited due to significant respiratory motion and resulting stairstep artifact, particularly in the bases. Within these limitations, no pulmonary emboli identified. The thoracic aorta and heart  are unremarkable. Mediastinum/Nodes: Mild bilateral gynecomastia is identified. The thyroid and esophagus are normal. No pleural or pericardial effusions. No adenopathy identified in the chest. Lungs/Pleura: Central airways are normal. No pneumothorax. No pulmonary nodules or masses. No focal infiltrates identified. Upper Abdomen: No acute abnormality. Musculoskeletal: No chest wall abnormality. No acute or significant osseous findings. Review of the MIP images confirms the above findings. IMPRESSION: 1. Evaluation for pulmonary emboli is limited due to significant respiratory motion resulting in stairstep artifact. Within these limitations, no emboli identified. 2. Mild bilateral gynecomastia. 3. No cause for the patient's symptoms identified. Electronically Signed   By: Gerome Sam III M.D.   On: 08/28/2021 11:10    Procedures Procedures    Medications Ordered in ED Medications  aspirin chewable tablet 324 mg (324 mg Oral Given 08/28/21 0923)  nitroGLYCERIN (NITROGLYN) 2 % ointment 1 inch (1 inch Topical Given 08/28/21 0923)  morphine 4 MG/ML injection 4 mg (4 mg Intravenous Given 08/28/21 0930)  iohexol (OMNIPAQUE) 350 MG/ML  injection 100 mL (100 mLs Intravenous Contrast Given 08/28/21 1058)  diazepam (VALIUM) injection 5 mg (5 mg Intravenous Given 08/28/21 1246)    ED Course/ Medical Decision Making/ A&P   HEAR Score: 5                       Medical Decision Making Amount and/or Complexity of Data Reviewed Labs: ordered. Radiology: ordered.  Risk OTC drugs. Prescription drug management.   This patient presents to the ED for concern of chest pain, this involves an extensive number of treatment options, and is a complaint that carries with it a high risk of complications and morbidity.  The differential diagnosis includes  ACS, PE, GERD, PUD.    Co morbidities that complicate the patient evaluation  Diabetes, hypertension   Additional history obtained:  Additional history obtained from patient's significant other External records from outside source obtained and reviewed including EMR   Lab Tests:  I Ordered, and personally interpreted labs.  The pertinent results include: Normal electrolytes, normal troponins, no evidence of anemia or leukocytosis, mildly elevated blood glucose.  Urine drug seen positive for cocaine and amphetamines.   Imaging Studies ordered:  I ordered imaging studies including CTA chest I independently visualized and interpreted imaging which showed no evidence of PE, no acute findings I agree with the radiologist interpretation   Cardiac Monitoring:  The patient was maintained on a cardiac monitor.  I personally viewed and interpreted the cardiac monitored which showed an underlying rhythm of: Sinus rhythm   Medicines ordered and prescription drug management:  I ordered medication including ASA, NTG, and morphine for empiric treatment of ACS; Valium for cocaine chest pain Reevaluation of the patient after these medicines showed that the patient resolved I have reviewed the patients home medicines and have made adjustments as needed   Problem List / ED  Course:  36 year old male with history of diabetes and hypertension, presenting for chest pain since last night. On arrival in the ED, patient has vital signs that are notable for moderate hypertension.  He is well-appearing on exam.  He does currently endorse chest pain.  Pain is slightly worse with deep inspiration.  It is not reproducible with palpation.  His lungs are clear to auscultation and his breathing is unlabored.  EKG shows no evidence of ischemia.  Based on risk factors, EKG findings, and symptoms, patient is a heart score of 5.  Diagnostic work-up was initiated.  Patient was given  ACS, NTG, morphine.  Work-up showed normal electrolytes and normal troponins.  CTA of chest showed no acute findings.  His drug screen was positive for cocaine and amphetamines, which is likely contributing to his symptoms.  Dose of Valium was given with complete relief of symptoms.  Patient is stable for discharge at this time.   Reevaluation:  After the interventions noted above, I reevaluated the patient and found that they have :resolved   Social Determinants of Health:  Polysubstance abuse   Dispostion:  After consideration of the diagnostic results and the patients response to treatment, I feel that the patent would benefit from discharge.           Final Clinical Impression(s) / ED Diagnoses Final diagnoses:  Chest pain, unspecified type    Rx / DC Orders ED Discharge Orders     None         Gloris Manchesterixon, [place], MD 08/29/21 [phone]

## 2021-08-28 NOTE — ED Triage Notes (Signed)
Pt began with left sided chest pain beginning last night, able to reproduce with movement, also pain bilat feet worse today, headache, Hx diabetes

## 2021-08-30 ENCOUNTER — Encounter (HOSPITAL_COMMUNITY): Payer: Self-pay

## 2021-08-30 ENCOUNTER — Other Ambulatory Visit: Payer: Self-pay

## 2021-08-30 ENCOUNTER — Emergency Department (HOSPITAL_COMMUNITY): Payer: Self-pay

## 2021-08-30 ENCOUNTER — Emergency Department (HOSPITAL_COMMUNITY)
Admission: EM | Admit: 2021-08-30 | Discharge: 2021-08-30 | Disposition: A | Discharge status: Home / Self Care | Payer: Self-pay | Attending: Emergency Medicine | Admitting: Emergency Medicine

## 2021-08-30 ENCOUNTER — Ambulatory Visit: Payer: Self-pay | Admitting: *Deleted

## 2021-08-30 DIAGNOSIS — Z79899 Other long term (current) drug therapy: Secondary | ICD-10-CM | POA: Insufficient documentation

## 2021-08-30 DIAGNOSIS — B351 Tinea unguium: Secondary | ICD-10-CM | POA: Insufficient documentation

## 2021-08-30 DIAGNOSIS — Z794 Long term (current) use of insulin: Secondary | ICD-10-CM | POA: Insufficient documentation

## 2021-08-30 DIAGNOSIS — I158 Other secondary hypertension: Secondary | ICD-10-CM | POA: Insufficient documentation

## 2021-08-30 DIAGNOSIS — M722 Plantar fascial fibromatosis: Secondary | ICD-10-CM | POA: Insufficient documentation

## 2021-08-30 LAB — CBC WITH DIFFERENTIAL/PLATELET
Abs Immature Granulocytes: 0.01 10*3/uL (ref 0.00–0.07)
Basophils Absolute: 0 10*3/uL (ref 0.0–0.1)
Basophils Relative: 1 %
Eosinophils Absolute: 0.1 10*3/uL (ref 0.0–0.5)
Eosinophils Relative: 2 %
HCT: 48.3 % (ref 39.0–52.0)
Hemoglobin: 16.2 g/dL (ref 13.0–17.0)
Immature Granulocytes: 0 %
Lymphocytes Relative: 44 %
Lymphs Abs: 2.7 10*3/uL (ref 0.7–4.0)
MCH: 28.7 pg (ref 26.0–34.0)
MCHC: 33.5 g/dL (ref 30.0–36.0)
MCV: 85.5 fL (ref 80.0–100.0)
Monocytes Absolute: 0.5 10*3/uL (ref 0.1–1.0)
Monocytes Relative: 9 %
Neutro Abs: 2.7 10*3/uL (ref 1.7–7.7)
Neutrophils Relative %: 44 %
Platelets: 217 10*3/uL (ref 150–400)
RBC: 5.65 MIL/uL (ref 4.22–5.81)
RDW: 12.5 % (ref 11.5–15.5)
WBC: 6 10*3/uL (ref 4.0–10.5)
nRBC: 0 % (ref 0.0–0.2)

## 2021-08-30 LAB — BASIC METABOLIC PANEL
Anion gap: 7 (ref 5–15)
BUN: 10 mg/dL (ref 6–20)
CO2: 23 mmol/L (ref 22–32)
Calcium: 8.8 mg/dL — ABNORMAL LOW (ref 8.9–10.3)
Chloride: 107 mmol/L (ref 98–111)
Creatinine, Ser: 1.21 mg/dL (ref 0.61–1.24)
GFR, Estimated: >60 mL/min (ref >60)
Glucose, Bld: 240 mg/dL — ABNORMAL HIGH (ref 70–99)
Potassium: 4.2 mmol/L (ref 3.5–5.1)
Sodium: 137 mmol/L (ref 135–145)

## 2021-08-30 LAB — CBG MONITORING, ED: Glucose-Capillary: 245 mg/dL — ABNORMAL HIGH (ref 70–99)

## 2021-08-30 MED ORDER — IBUPROFEN 200 MG PO TABS
400.0000 mg | ORAL_TABLET | Freq: Once | ORAL | Status: AC
  Administered 2021-08-30: 400 mg via ORAL
  Filled 2021-08-30: qty 2

## 2021-08-30 NOTE — ED Triage Notes (Signed)
Pt c/o right foot numbness,tingling, and pain x3 days. Pt states he is diabetic and has been checking his blood sugar and taking his insulin as prescribed. Pt states his blood sugar has been running high 200-300 for last few days. Pt CBG 245 in triage. Pt right foot has no open wounds at this time.

## 2021-08-30 NOTE — ED Provider Triage Note (Signed)
Emergency Medicine Provider Triage Evaluation Note  [name] [name] , a 36 y.o. male  was evaluated in triage.  Pt complains of right foot pain  Review of Systems  Positive: pain Negative: No fever  Physical Exam  BP (!) 174/114 (BP Location: Right Arm)    Pulse 87    Temp 99.2 F (37.3 C) (Oral)    Resp 18    SpO2 99%  Gen:   Awake, no distress   Resp:  Normal effort  MSK:   Moves extremities without difficulty  Other:  Right foot slight swelling,    Medical Decision Making  Medically screening exam initiated at 6:42 PM.  Appropriate orders placed.  [name] [name] was informed that the remainder of the evaluation will be completed by another provider, this initial triage assessment does not replace that evaluation, and the importance of remaining in the ED until their evaluation is complete.     Elson Areas, [place] 08/30/21 1843

## 2021-08-30 NOTE — Telephone Encounter (Signed)
°  Chief Complaint: Right foot pain Symptoms: foot pain, warm to touch, 10/10, mild swelling Frequency: Onset Monday Pertinent Negatives: Patient denies Redness, fever Disposition: [x] ED /[] Urgent Care (no appt availability in office) / [] Appointment(In office/virtual)/ []  [place] [facility]/ [] Home Care/ [] Refused Recommended Disposition /[] [place] [facility]/ []  Follow-up with PCP Additional Notes: Pt states he is diabetic   Reason for Disposition  [1] SEVERE pain (e.g., excruciating, unable to do any normal activities) AND [2] not improved after 2 hours of pain medicine  Answer Assessment - Initial Assessment Questions 1. ONSET: "When did the pain start?"      3 days ago 2. LOCATION: "Where is the pain located?"      Right foot,toes and bottom 3. PAIN: "How bad is the pain?"    (Scale 1-10; or mild, moderate, severe)  - MILD (1-3): doesn't interfere with normal activities.   - MODERATE (4-7): interferes with normal activities (e.g., work or school) or awakens from sleep, limping.   - SEVERE (8-10): excruciating pain, unable to do any normal activities, unable to walk.      10/10 4. WORK OR EXERCISE: "Has there been any recent work or exercise that involved this part of the body?"      no 5. CAUSE: "What do you think is causing the foot pain?"     unsure 6. OTHER SYMPTOMS: "Do you have any other symptoms?" (e.g., leg pain, rash, fever, numbness)     Warm to touch,mild swelling, numbness  Protocols used: Foot Pain-A-AH

## 2021-08-30 NOTE — Discharge Instructions (Addendum)
The pain in your foot is probably from Planter fasciitis.  To help this, take ibuprofen 400 mg 3 times a day with meals.  Also use heat on the sore area, either soaking or heating pad, 3-4 times a day to help with the discomfort.  The toenail infection that you have, needs to be evaluated and treated by podiatrist.  Call the listed podiatrist to get an appointment to be seen for further care and treatment.  They can also help you with the plantar fasciitis.  Your blood pressure is mildly elevated today.  Continue taking your blood pressure medicine.  Follow-up with a primary care doctor for blood pressure check in 1 or 2 weeks.  You can use the resource guide which is attached to help you find a doctor if you Do not have one.

## 2021-08-30 NOTE — ED Provider Notes (Signed)
[name] [name] Provider Note   CSN: [id number] Arrival date & time: 08/30/21  1818     History  Chief Complaint  Patient presents with   Right Foot Numbness    [name] [name] is a 36 y.o. male.  HPI He presents for evaluation of right knee pain.  He is not currently employed.  He was released from prison about 4 weeks ago.  He states his right foot has been hurting for 4 days.  He does not know of any trauma.  He is worried about "neuropathy."  Causing the pain.    Home Medications Prior to Admission medications   Medication Sig Start Date End Date Taking? Authorizing Provider  amLODipine (NORVASC) 10 MG tablet Take 1 tablet (10 mg total) by mouth daily. 02/12/20   Rancour, Annie Main, MD  insulin aspart (NOVOLOG) 100 UNIT/ML injection Inject 15 Units into the skin 3 (three) times daily before meals. 02/12/20   Rancour, Annie Main, MD  insulin glargine (LANTUS) 100 UNIT/ML injection Inject 0.35 mLs (35 Units total) into the skin at bedtime. 02/12/20   Rancour, Annie Main, MD  lidocaine (LIDODERM) 5 % Place 1 patch onto the skin daily. Remove & Discard patch within 12 hours or as directed by MD 06/29/19   Nuala Alpha A, PA-C  lisinopril (ZESTRIL) 10 MG tablet Take 1 tablet (10 mg total) by mouth daily. 02/12/20   Rancour, Annie Main, MD  ondansetron (ZOFRAN) 4 MG tablet Take 1 tablet (4 mg total) by mouth every 6 (six) hours as needed for nausea. 07/06/19   Aline August, MD      Allergies    Patient has no known allergies.    Review of Systems   Review of Systems  Physical Exam Updated Vital Signs BP (!) 174/114 (BP Location: Right Arm)    Pulse 87    Temp 99.2 F (37.3 C) (Oral)    Resp 18    SpO2 99%  Physical Exam Vitals and nursing note reviewed.  Constitutional:      General: He is not in acute distress.    Appearance: He is well-developed. He is not ill-appearing or diaphoretic.     Comments: He was lying slightly on the bed when I approached  him.  He was drowsy but unable to talk to me and let me examine him.  HENT:     Head: Normocephalic and atraumatic.     Right Ear: External ear normal.     Left Ear: External ear normal.  Eyes:     Conjunctiva/sclera: Conjunctivae normal.     Pupils: Pupils are equal, round, and reactive to light.  Neck:     Trachea: Phonation normal.  Cardiovascular:     Rate and Rhythm: Normal rate.  Pulmonary:     Effort: Pulmonary effort is normal.  Abdominal:     General: There is no distension.  Musculoskeletal:     Cervical back: Normal range of motion and neck supple.     Comments: Right foot is mildly tender diffusely on the plantar aspect.  He guards against passive motion of the mid and forefoot secondary to pain.  No significant left foot discomfort to palpation.  Feet appear symmetric.  No significant swelling of either foot.  No erythema of the feet.  No signs or symptoms of cellulitis or lymphangitis.  He has significant toenail fungus, worse on the right greater than left.  Skin:    General: Skin is warm and dry.  Neurological:  Mental Status: He is alert and oriented to person, place, and time.     Cranial Nerves: No cranial nerve deficit.     Sensory: No sensory deficit.     Motor: No abnormal muscle tone.     Coordination: Coordination normal.  Psychiatric:        Mood and Affect: Mood normal.        Behavior: Behavior normal.        Thought Content: Thought content normal.        Judgment: Judgment normal.    ED Results / Procedures / Treatments   Labs (all labs ordered are listed, but only abnormal results are displayed) Labs Reviewed  BASIC METABOLIC PANEL - Abnormal; Notable for the following components:      Result Value   Glucose, Bld 240 (*)    Calcium 8.8 (*)    All other components within normal limits  CBG MONITORING, ED - Abnormal; Notable for the following components:   Glucose-Capillary 245 (*)    All other components within normal limits  CBC WITH  DIFFERENTIAL/PLATELET    EKG None  Radiology DG Foot Complete Right  Result Date: 08/30/2021 CLINICAL DATA:  pain EXAM: RIGHT FOOT COMPLETE - 3+ VIEW COMPARISON:  None. FINDINGS: There is no evidence of fracture or dislocation. There is no evidence of arthropathy or other focal bone abnormality. Soft tissues are unremarkable. No retained radiopaque foreign body. IMPRESSION: Negative. Electronically Signed   By: Iven Finn M.D.   On: 08/30/2021 19:19    Procedures Procedures    Medications Ordered in ED Medications  ibuprofen (ADVIL) tablet 400 mg (has no administration in time range)    ED Course/ Medical Decision Making/ A&P                           Medical Decision Making He is presenting for evaluation of atraumatic foot pain.  Problems Addressed: Other secondary hypertension: chronic illness or injury    Details: Currently being treated Plantar fasciitis: undiagnosed new problem with uncertain prognosis Toenail fungus: chronic illness or injury    Details: Incidental finding  Amount and/or Complexity of Data Reviewed Labs: ordered.    Details: CBC, metabolic panel- normal except glucose high Radiology: ordered.  Risk OTC drugs. Decision regarding hospitalization. Diagnosis or treatment significantly limited by social determinants of health. Risk Details: Patient presenting with atraumatic foot pain.  He has history of diabetes and hypertension.  He was recently was from prison.  He takes medication for diabetes and hypertension.  Clinical exam is consistent with plantar fasciitis and incidental toenail fungus.  Evidence for cellulitis.  No indication for further ED intervention, or treatment.  He is instructed to use heat and ibuprofen for pain.  He is referred to podiatry for his foot condition.  He is referred to a PCP service for management of his chronic diabetes and hypertension.  Without current metabolic instability or hypertensive  urgency.           Final Clinical Impression(s) / ED Diagnoses Final diagnoses:  Plantar fasciitis  Toenail fungus  Other secondary hypertension    Rx / DC Orders ED Discharge Orders     None         Daleen Bo, MD 08/30/21 2147

## 2022-01-23 ENCOUNTER — Other Ambulatory Visit (HOSPITAL_COMMUNITY): Payer: Self-pay

## 2022-01-23 ENCOUNTER — Encounter (HOSPITAL_COMMUNITY): Payer: Self-pay

## 2022-01-23 ENCOUNTER — Other Ambulatory Visit: Payer: Self-pay

## 2022-01-23 ENCOUNTER — Emergency Department (HOSPITAL_COMMUNITY)
Admission: EM | Admit: 2022-01-23 | Discharge: 2022-01-23 | Disposition: A | Discharge status: Home / Self Care | Payer: Self-pay | Attending: Emergency Medicine | Admitting: Emergency Medicine

## 2022-01-23 DIAGNOSIS — Z794 Long term (current) use of insulin: Secondary | ICD-10-CM | POA: Insufficient documentation

## 2022-01-23 DIAGNOSIS — R000 Tachycardia, unspecified: Secondary | ICD-10-CM | POA: Insufficient documentation

## 2022-01-23 DIAGNOSIS — E1065 Type 1 diabetes mellitus with hyperglycemia: Secondary | ICD-10-CM | POA: Insufficient documentation

## 2022-01-23 DIAGNOSIS — R739 Hyperglycemia, unspecified: Secondary | ICD-10-CM

## 2022-01-23 DIAGNOSIS — Z760 Encounter for issue of repeat prescription: Secondary | ICD-10-CM | POA: Insufficient documentation

## 2022-01-23 LAB — BLOOD GAS, VENOUS
Acid-Base Excess: 3.1 mmol/L — ABNORMAL HIGH (ref 0.0–2.0)
Bicarbonate: 29.5 mmol/L — ABNORMAL HIGH (ref 20.0–28.0)
O2 Saturation: 47 %
Patient temperature: 37
pCO2, Ven: 51 mmHg (ref 44–60)
pH, Ven: 7.37 (ref 7.25–7.43)
pO2, Ven: <31 mmHg — CL (ref 32–45)

## 2022-01-23 LAB — BASIC METABOLIC PANEL
Anion gap: 11 (ref 5–15)
BUN: 17 mg/dL (ref 6–20)
CO2: 24 mmol/L (ref 22–32)
Calcium: 9.6 mg/dL (ref 8.9–10.3)
Chloride: 95 mmol/L — ABNORMAL LOW (ref 98–111)
Creatinine, Ser: 1.39 mg/dL — ABNORMAL HIGH (ref 0.61–1.24)
GFR, Estimated: >60 mL/min (ref >60)
Glucose, Bld: 368 mg/dL — ABNORMAL HIGH (ref 70–99)
Potassium: 5 mmol/L (ref 3.5–5.1)
Sodium: 130 mmol/L — ABNORMAL LOW (ref 135–145)

## 2022-01-23 LAB — URINALYSIS, ROUTINE W REFLEX MICROSCOPIC
Bilirubin Urine: NEGATIVE
Glucose, UA: >=500 mg/dL — AB
Ketones, ur: 20 mg/dL — AB
Leukocytes,Ua: NEGATIVE
Nitrite: NEGATIVE
Protein, ur: 30 mg/dL — AB
Specific Gravity, Urine: 1.024 (ref 1.005–1.030)
pH: 5 (ref 5.0–8.0)

## 2022-01-23 LAB — CBC
HCT: 52.9 % — ABNORMAL HIGH (ref 39.0–52.0)
Hemoglobin: 18.3 g/dL — ABNORMAL HIGH (ref 13.0–17.0)
MCH: 28.3 pg (ref 26.0–34.0)
MCHC: 34.6 g/dL (ref 30.0–36.0)
MCV: 81.8 fL (ref 80.0–100.0)
Platelets: 231 10*3/uL (ref 150–400)
RBC: 6.47 MIL/uL — ABNORMAL HIGH (ref 4.22–5.81)
RDW: 12.5 % (ref 11.5–15.5)
WBC: 6.6 10*3/uL (ref 4.0–10.5)
nRBC: 0 % (ref 0.0–0.2)

## 2022-01-23 LAB — CBG MONITORING, ED
Glucose-Capillary: 422 mg/dL — ABNORMAL HIGH (ref 70–99)
Glucose-Capillary: 313 mg/dL — ABNORMAL HIGH (ref 70–99)

## 2022-01-23 MED ORDER — "INSULIN SYRINGE-NEEDLE U-100 30G X 5/16"" 0.3 ML MISC"
Freq: Three times a day (TID) | 0 refills | Status: DC
  Filled 2022-01-23: qty 90, 30d supply, fill #0

## 2022-01-23 MED ORDER — AMLODIPINE BESYLATE 10 MG PO TABS
10.0000 mg | ORAL_TABLET | Freq: Every day | ORAL | 0 refills | Status: DC
  Filled 2022-01-23 (×2): qty 30, 30d supply, fill #0

## 2022-01-23 MED ORDER — SODIUM CHLORIDE 0.9 % IV BOLUS
1000.0000 mL | Freq: Once | INTRAVENOUS | Status: AC
  Administered 2022-01-23: 1000 mL via INTRAVENOUS

## 2022-01-23 MED ORDER — LISINOPRIL 10 MG PO TABS
10.0000 mg | ORAL_TABLET | Freq: Every day | ORAL | 0 refills | Status: DC
  Filled 2022-01-23 (×2): qty 30, 30d supply, fill #0

## 2022-01-23 MED ORDER — INSULIN NPH ISOPHANE & REGULAR (70-30) 100 UNIT/ML ~~LOC~~ SUSP
15.0000 [IU] | Freq: Three times a day (TID) | SUBCUTANEOUS | 1 refills | Status: DC
  Filled 2022-01-23 (×2): qty 10, 22d supply, fill #0
  Filled 2022-05-02 (×2): qty 10, 22d supply, fill #1

## 2022-01-23 MED ORDER — INSULIN ASPART 100 UNIT/ML IJ SOLN
10.0000 [IU] | INTRAMUSCULAR | Status: AC
  Administered 2022-01-23: 10 [IU] via SUBCUTANEOUS
  Filled 2022-01-23: qty 0.1

## 2022-01-23 NOTE — ED Triage Notes (Signed)
Pt reports his CBG has been reading high for the past few days. Pt reports being out of medication for about 2 weeks. Endorses N/V, abdominal pain, headache, and dizziness.

## 2022-01-23 NOTE — Progress Notes (Signed)
  [facility] Medication Assistance Card Name: Riddik Senna (MRN): [id number] Bin: [id number] RX Group: [id number] Discharge Date: 01/23/22 Expiration Date:02/04/22                                           (must be filled within 7 days of discharge)     You have been approved to have the prescriptions written by your discharging physician filled through our [facility] (Medication Assistance Through [facility]) program. This program allows for a one-time (no refills) 34-day supply of selected medications for a low copay amount.  The copay is $3.00 per prescription. For instance, if you have one prescription, you will pay $3.00; for two prescriptions, you pay $6.00; for three prescriptions, you pay $9.00; and so on.  Only certain pharmacies are participating in this program with [facility]. You will need to select one of the pharmacies from the attached list and take your prescriptions, this letter, and your photo ID to one of the [facility] Outpatient pharmacies, [facility], [facility] [address] or [facility] [address].   We are excited that you are able to use the [facility] program to get your medications. These prescriptions must be filled within 7 days of hospital discharge or they will no longer be valid for the [facility] program. Should you have any problems with your prescriptions please contact your case management team member at [phone] for [place]/ [facility].  Thank you, [facility] Care Management

## 2022-01-23 NOTE — ED Provider Notes (Signed)
[facility]-EMERGENCY DEPT Provider Note   CSN: [id number] Arrival date & time: 01/23/22  1208     History  Chief Complaint  Patient presents with   Hyperglycemia    [name] [name] is a 36 y.o. male.  HPI 36 year old male history of type 1 diabetes presents today with reports of hyperglycemia.  Has been out of his medication for 2 days.  He is out of his medication due to financial reasons.  He reports previously taking it as prescribed.  He is concerned that he might have DKA.  He states his symptoms are increased frequency of urination.  He denies any chest pain, fever, dyspnea, abdominal pain, nausea, vomiting, or diarrhea.    Home Medications Prior to Admission medications   Medication Sig Start Date End Date Taking? Authorizing Provider  acetaminophen (TYLENOL) 500 MG tablet Take 1,000 mg by mouth every 6 (six) hours as needed for mild pain.   Yes [provider]  amLODipine (NORVASC) 10 MG tablet Take 1 tablet (10 mg total) by mouth daily. 01/23/22   Margarita Grizzle, MD  lisinopril (ZESTRIL) 10 MG tablet Take 1 tablet (10 mg total) by mouth daily. 01/23/22   Margarita Grizzle, MD  insulin NPH-regular Human (70-30) 100 UNIT/ML injection Inject 15 Units into the skin 3 (three) times daily. 01/23/22   Margarita Grizzle, MD      Allergies    Patient has no known allergies.    Review of Systems   Review of Systems  Physical Exam Updated Vital Signs BP (!) 155/128 (BP Location: Right Arm)   Pulse 78   Temp 98 F (36.7 C)   Resp 16   SpO2 98%  Physical Exam Vitals reviewed.  Constitutional:      Appearance: Normal appearance.     Comments: Tachycardia and hypertension noted  HENT:     Head: Normocephalic.     Right Ear: External ear normal.     Left Ear: External ear normal.     Mouth/Throat:     Mouth: Mucous membranes are moist.     Pharynx: Oropharynx is clear.  Eyes:     Pupils: Pupils are equal, round, and reactive to light.   Cardiovascular:     Rate and Rhythm: Regular rhythm. Tachycardia present.  Pulmonary:     Effort: Pulmonary effort is normal.     Breath sounds: Normal breath sounds.  Abdominal:     General: Abdomen is flat.     Palpations: Abdomen is soft.  Musculoskeletal:        General: Normal range of motion.     Cervical back: Normal range of motion.  Skin:    General: Skin is warm.     Capillary Refill: Capillary refill takes less than 2 seconds.  Neurological:     General: No focal deficit present.     Mental Status: He is alert.     ED Results / Procedures / Treatments   Labs (all labs ordered are listed, but only abnormal results are displayed) Labs Reviewed  BASIC METABOLIC PANEL - Abnormal; Notable for the following components:      Result Value   Sodium 130 (*)    Chloride 95 (*)    Glucose, Bld 368 (*)    Creatinine, Ser 1.39 (*)    All other components within normal limits  CBC - Abnormal; Notable for the following components:   RBC 6.47 (*)    Hemoglobin 18.3 (*)    HCT 52.9 (*)  All other components within normal limits  URINALYSIS, ROUTINE W REFLEX MICROSCOPIC - Abnormal; Notable for the following components:   APPearance CLOUDY (*)    Glucose, UA >=500 (*)    Hgb urine dipstick SMALL (*)    Ketones, ur 20 (*)    Protein, ur 30 (*)    Bacteria, UA RARE (*)    All other components within normal limits  BLOOD GAS, VENOUS - Abnormal; Notable for the following components:   pO2, Ven <31 (*)    Bicarbonate 29.5 (*)    Acid-Base Excess 3.1 (*)    All other components within normal limits  CBG MONITORING, ED - Abnormal; Notable for the following components:   Glucose-Capillary 422 (*)    All other components within normal limits  CBG MONITORING, ED - Abnormal; Notable for the following components:   Glucose-Capillary 313 (*)    All other components within normal limits  URINE CULTURE  CBG MONITORING, ED  CBG MONITORING, ED    EKG None  Radiology No  results found.  Procedures Procedures    Medications Ordered in ED Medications  sodium chloride 0.9 % bolus 1,000 mL (0 mLs Intravenous Stopped 01/23/22 1530)  insulin aspart (novoLOG) injection 10 Units (10 Units Subcutaneous Given by Other 01/23/22 1410)    ED Course/ Medical Decision Making/ A&P Clinical Course as of 01/23/22 1620  Wed Jan 23, 2022  1455 Blood gas, venous (at [facility] and [facility], not at [facility])(!!) VBG with normal pH of 7.37 [DR]  1455 Basic metabolic panel(!) Metabolic panel with glucose elevated at 368 and CO2 normal, no evidence of DKA [DR]  1456 CBC(!) CBC normal white count, elevated hemoglobin at 18 [DR]  1456 CBC(!) Previous hemoglobin 16 This may represent some hemoconcentration [DR]    Clinical Course User Index [DR] Margarita Grizzle, MD                           Medical Decision Making 36 year old male with known type 1 diabetes presents today with noncompliance with medications. Patient is hyperglycemic but does not any evidence of DKA on his evaluation here.  Amount and/or Complexity of Data Reviewed Labs: ordered. Decision-making details documented in ED Course.  Risk OTC drugs. Prescription drug management.   Patient seen by transitions of care and they will have arranged for prescription drug assistance Prescription sent to the [facility] outpatient pharmacy Discussed return precautions patient voiced understanding        Final Clinical Impression(s) / ED Diagnoses Final diagnoses:  Hyperglycemia  Medication refill    Rx / DC Orders ED Discharge Orders          Ordered    amLODipine (NORVASC) 10 MG tablet  Daily        01/23/22 1458    lisinopril (ZESTRIL) 10 MG tablet  Daily        01/23/22 1458    insulin NPH-regular Human (70-30) 100 UNIT/ML injection  3 times daily        01/23/22 1458              Margarita Grizzle, MD 01/23/22 1620

## 2022-01-23 NOTE — ED Notes (Signed)
This RN attempted X 2 for PIV without success. Pt refuses to allow staff to place IV in hands. IV team consult placed.

## 2022-01-23 NOTE — Discharge Instructions (Signed)
Take medications as prescribed Establish primary care Return if you are having any new or worsening symptoms

## 2022-01-23 NOTE — TOC Transition Note (Signed)
Transition of Care [facility]) - CM/SW Discharge Note   Patient Details  Name: [name] [name] MRN: [id number] Date of Birth: 1986/02/23  [facility]) CM/SW Contact:  Lavenia Atlas, RN Phone Number: 01/23/2022, 3:32 PM   Clinical Narrative:    Patient presents to [facility] for elevated CBG at home for past few days. RNCM received TOC consult for medication assistance. RNCM spoke with patient and girlfriend at bedside regarding affording medications. Patient reports he has been out of his medications for a couple of weeks. Patient approved for [facility] program and verbalized ability to afford the $3 per medications copay.  Patient reports not having a PCP. This RNCM attempt to schedule follow up/new patient PCP appointment however scheduler at [facility] checked multiple [facility] facilities and unable to provide an appointment. [facility] will call this RNCM/ patient back to schedule an appointment within the next 7-14 days.    No additional TOC needs at this time.     Patient Goals and CMS Choice  Patient states their goals for this ED visit and ongoing recovery are:: "get back to halfway normal"      Discharge Placement  Expected Discharge Plan: Home  In-house Referral: RN Care Manager  Post Acute Care Choice:  n/a Living arrangements for the past 2 months: Apartment  Discharge Plan and Services    Home with medication assistance and assistance coordinating PCP care to follow for hyperglycemia.        Patient will go to [facility] to fill medications.                Social Determinants of Health (SDOH) Interventions   Financial resource strain.  Readmission Risk Interventions     No data to display

## 2022-01-25 ENCOUNTER — Other Ambulatory Visit (HOSPITAL_COMMUNITY): Payer: Self-pay

## 2022-01-25 LAB — URINE CULTURE: Culture: <10000 — AB

## 2022-01-30 NOTE — Progress Notes (Signed)
Erroneous encounter-disregard

## 2022-02-06 ENCOUNTER — Encounter: Payer: Self-pay | Admitting: Family

## 2022-02-06 DIAGNOSIS — E1065 Type 1 diabetes mellitus with hyperglycemia: Secondary | ICD-10-CM

## 2022-02-06 DIAGNOSIS — Z7689 Persons encountering health services in other specified circumstances: Secondary | ICD-10-CM

## 2022-03-05 NOTE — ED Notes (Signed)
Opened chart at pts request to reprint AVS for him to present to his parole officer.

## 2022-04-05 ENCOUNTER — Emergency Department (HOSPITAL_COMMUNITY)
Admission: EM | Admit: 2022-04-05 | Discharge: 2022-04-05 | Discharge status: Against Medical Advice | Payer: Self-pay | Attending: Emergency Medicine | Admitting: Emergency Medicine

## 2022-04-05 ENCOUNTER — Emergency Department (HOSPITAL_COMMUNITY)
Admission: EM | Admit: 2022-04-05 | Discharge: 2022-04-07 | Disposition: A | Discharge status: Home / Self Care | Payer: Self-pay | Attending: Emergency Medicine | Admitting: Emergency Medicine

## 2022-04-05 ENCOUNTER — Encounter (HOSPITAL_COMMUNITY): Payer: Self-pay

## 2022-04-05 ENCOUNTER — Emergency Department (HOSPITAL_COMMUNITY): Payer: Self-pay

## 2022-04-05 ENCOUNTER — Other Ambulatory Visit: Payer: Self-pay

## 2022-04-05 ENCOUNTER — Ambulatory Visit (HOSPITAL_COMMUNITY)
Admission: EM | Admit: 2022-04-05 | Discharge: 2022-04-05 | Disposition: A | Discharge status: Other Acute Inpatient Hospital | Payer: No Payment, Other | Attending: Family | Admitting: Family

## 2022-04-05 DIAGNOSIS — R079 Chest pain, unspecified: Secondary | ICD-10-CM

## 2022-04-05 DIAGNOSIS — F1994 Other psychoactive substance use, unspecified with psychoactive substance-induced mood disorder: Secondary | ICD-10-CM

## 2022-04-05 DIAGNOSIS — Z046 Encounter for general psychiatric examination, requested by authority: Secondary | ICD-10-CM

## 2022-04-05 DIAGNOSIS — Z20822 Contact with and (suspected) exposure to covid-19: Secondary | ICD-10-CM | POA: Insufficient documentation

## 2022-04-05 DIAGNOSIS — Z79899 Other long term (current) drug therapy: Secondary | ICD-10-CM | POA: Insufficient documentation

## 2022-04-05 DIAGNOSIS — F1924 Other psychoactive substance dependence with psychoactive substance-induced mood disorder: Secondary | ICD-10-CM | POA: Insufficient documentation

## 2022-04-05 DIAGNOSIS — R42 Dizziness and giddiness: Secondary | ICD-10-CM | POA: Insufficient documentation

## 2022-04-05 DIAGNOSIS — R739 Hyperglycemia, unspecified: Secondary | ICD-10-CM

## 2022-04-05 DIAGNOSIS — I10 Essential (primary) hypertension: Secondary | ICD-10-CM | POA: Insufficient documentation

## 2022-04-05 DIAGNOSIS — R0789 Other chest pain: Secondary | ICD-10-CM | POA: Insufficient documentation

## 2022-04-05 DIAGNOSIS — Z5329 Procedure and treatment not carried out because of patient's decision for other reasons: Secondary | ICD-10-CM | POA: Insufficient documentation

## 2022-04-05 DIAGNOSIS — Z794 Long term (current) use of insulin: Secondary | ICD-10-CM | POA: Insufficient documentation

## 2022-04-05 DIAGNOSIS — R45851 Suicidal ideations: Secondary | ICD-10-CM | POA: Insufficient documentation

## 2022-04-05 DIAGNOSIS — F22 Delusional disorders: Secondary | ICD-10-CM | POA: Insufficient documentation

## 2022-04-05 DIAGNOSIS — F32A Depression, unspecified: Secondary | ICD-10-CM | POA: Insufficient documentation

## 2022-04-05 DIAGNOSIS — G4700 Insomnia, unspecified: Secondary | ICD-10-CM | POA: Insufficient documentation

## 2022-04-05 DIAGNOSIS — R0602 Shortness of breath: Secondary | ICD-10-CM | POA: Insufficient documentation

## 2022-04-05 DIAGNOSIS — E876 Hypokalemia: Secondary | ICD-10-CM | POA: Insufficient documentation

## 2022-04-05 DIAGNOSIS — E109 Type 1 diabetes mellitus without complications: Secondary | ICD-10-CM | POA: Insufficient documentation

## 2022-04-05 DIAGNOSIS — R059 Cough, unspecified: Secondary | ICD-10-CM | POA: Insufficient documentation

## 2022-04-05 DIAGNOSIS — R000 Tachycardia, unspecified: Secondary | ICD-10-CM | POA: Insufficient documentation

## 2022-04-05 DIAGNOSIS — F1910 Other psychoactive substance abuse, uncomplicated: Secondary | ICD-10-CM | POA: Insufficient documentation

## 2022-04-05 HISTORY — DX: Type 2 diabetes mellitus without complications: E11.9

## 2022-04-05 LAB — COMPREHENSIVE METABOLIC PANEL
ALT: 13 U/L (ref 0–44)
AST: 21 U/L (ref 15–41)
Albumin: 4.1 g/dL (ref 3.5–5.0)
Alkaline Phosphatase: 70 U/L (ref 38–126)
Anion gap: 15 (ref 5–15)
BUN: 18 mg/dL (ref 6–20)
CO2: 21 mmol/L — ABNORMAL LOW (ref 22–32)
Calcium: 9.2 mg/dL (ref 8.9–10.3)
Chloride: 100 mmol/L (ref 98–111)
Creatinine, Ser: 1.25 mg/dL — ABNORMAL HIGH (ref 0.61–1.24)
GFR, Estimated: >60 mL/min (ref >60)
Glucose, Bld: 287 mg/dL — ABNORMAL HIGH (ref 70–99)
Potassium: 3.7 mmol/L (ref 3.5–5.1)
Sodium: 136 mmol/L (ref 135–145)
Total Bilirubin: 1.5 mg/dL — ABNORMAL HIGH (ref 0.3–1.2)
Total Protein: 7.6 g/dL (ref 6.5–8.1)

## 2022-04-05 LAB — CBC
HCT: 46.1 % (ref 39.0–52.0)
HCT: 47.2 % (ref 39.0–52.0)
Hemoglobin: 15.5 g/dL (ref 13.0–17.0)
Hemoglobin: 15.7 g/dL (ref 13.0–17.0)
MCH: 28.3 pg (ref 26.0–34.0)
MCH: 28.5 pg (ref 26.0–34.0)
MCHC: 33.6 g/dL (ref 30.0–36.0)
MCHC: 33.3 g/dL (ref 30.0–36.0)
MCV: 84.3 fL (ref 80.0–100.0)
MCV: 85.8 fL (ref 80.0–100.0)
Platelets: 234 10*3/uL (ref 150–400)
Platelets: 217 10*3/uL (ref 150–400)
RBC: 5.47 MIL/uL (ref 4.22–5.81)
RBC: 5.5 MIL/uL (ref 4.22–5.81)
RDW: 13 % (ref 11.5–15.5)
RDW: 13.2 % (ref 11.5–15.5)
WBC: 7.1 10*3/uL (ref 4.0–10.5)
WBC: 7.5 10*3/uL (ref 4.0–10.5)
nRBC: 0 % (ref 0.0–0.2)
nRBC: 0 % (ref 0.0–0.2)

## 2022-04-05 LAB — BASIC METABOLIC PANEL
Anion gap: 8 (ref 5–15)
BUN: 17 mg/dL (ref 6–20)
CO2: 26 mmol/L (ref 22–32)
Calcium: 9.3 mg/dL (ref 8.9–10.3)
Chloride: 104 mmol/L (ref 98–111)
Creatinine, Ser: 1.09 mg/dL (ref 0.61–1.24)
GFR, Estimated: >60 mL/min (ref >60)
Glucose, Bld: 138 mg/dL — ABNORMAL HIGH (ref 70–99)
Potassium: 3.2 mmol/L — ABNORMAL LOW (ref 3.5–5.1)
Sodium: 138 mmol/L (ref 135–145)

## 2022-04-05 LAB — RAPID URINE DRUG SCREEN, HOSP PERFORMED
Amphetamines: POSITIVE — AB
Barbiturates: NOT DETECTED
Benzodiazepines: NOT DETECTED
Cocaine: POSITIVE — AB
Opiates: NOT DETECTED
Tetrahydrocannabinol: NOT DETECTED

## 2022-04-05 LAB — ACETAMINOPHEN LEVEL: Acetaminophen (Tylenol), Serum: <10 ug/mL — ABNORMAL LOW (ref 10–30)

## 2022-04-05 LAB — RESP PANEL BY RT-PCR (FLU A&B, COVID) ARPGX2
Influenza A by PCR: NEGATIVE
Influenza B by PCR: NEGATIVE
SARS Coronavirus 2 by RT PCR: NEGATIVE

## 2022-04-05 LAB — SALICYLATE LEVEL: Salicylate Lvl: <7 mg/dL — ABNORMAL LOW (ref 7.0–30.0)

## 2022-04-05 LAB — TROPONIN I (HIGH SENSITIVITY)
Troponin I (High Sensitivity): 10 ng/L (ref <18)
Troponin I (High Sensitivity): 9 ng/L (ref <18)

## 2022-04-05 LAB — ETHANOL: Alcohol, Ethyl (B): <10 mg/dL (ref <10)

## 2022-04-05 MED ORDER — IOHEXOL 350 MG/ML SOLN
100.0000 mL | Freq: Once | INTRAVENOUS | Status: AC | PRN
  Administered 2022-04-05: 100 mL via INTRAVENOUS

## 2022-04-05 MED ORDER — SODIUM CHLORIDE 0.9 % IV BOLUS
1000.0000 mL | Freq: Once | INTRAVENOUS | Status: AC
  Administered 2022-04-05: 1000 mL via INTRAVENOUS

## 2022-04-05 MED ORDER — POTASSIUM CHLORIDE CRYS ER 20 MEQ PO TBCR: 40.0000 meq | EXTENDED_RELEASE_TABLET | Freq: Once | ORAL | Status: DC

## 2022-04-05 MED ORDER — LIDOCAINE VISCOUS HCL 2 % MT SOLN
15.0000 mL | Freq: Once | OROMUCOSAL | Status: AC
  Administered 2022-04-05: 15 mL via ORAL
  Filled 2022-04-05: qty 15

## 2022-04-05 MED ORDER — SODIUM CHLORIDE (PF) 0.9 % IJ SOLN
INTRAMUSCULAR | Status: AC
  Filled 2022-04-05: qty 50

## 2022-04-05 MED ORDER — TRAZODONE HCL 50 MG PO TABS: 50.0000 mg | ORAL_TABLET | Freq: Every evening | ORAL | Status: DC | PRN

## 2022-04-05 MED ORDER — ALUM & MAG HYDROXIDE-SIMETH 200-200-20 MG/5ML PO SUSP
30.0000 mL | ORAL | Status: DC | PRN
Start: 2022-04-05 — End: 2022-04-06

## 2022-04-05 MED ORDER — LORAZEPAM 2 MG/ML IJ SOLN
1.0000 mg | Freq: Once | INTRAMUSCULAR | Status: AC
  Administered 2022-04-05: 1 mg via INTRAVENOUS
  Filled 2022-04-05: qty 1

## 2022-04-05 MED ORDER — ALUM & MAG HYDROXIDE-SIMETH 200-200-20 MG/5ML PO SUSP
30.0000 mL | Freq: Once | ORAL | Status: AC
  Administered 2022-04-05: 30 mL via ORAL
  Filled 2022-04-05: qty 30

## 2022-04-05 MED ORDER — MAGNESIUM HYDROXIDE 400 MG/5ML PO SUSP: 30.0000 mL | Freq: Every day | ORAL | Status: DC | PRN

## 2022-04-05 MED ORDER — AMLODIPINE BESYLATE 5 MG PO TABS
10.0000 mg | ORAL_TABLET | Freq: Once | ORAL | Status: AC
  Administered 2022-04-05: 10 mg via ORAL
  Filled 2022-04-05: qty 2

## 2022-04-05 MED ORDER — HYDROXYZINE HCL 25 MG PO TABS: 25.0000 mg | ORAL_TABLET | Freq: Three times a day (TID) | ORAL | Status: DC | PRN

## 2022-04-05 MED ORDER — ACETAMINOPHEN 325 MG PO TABS: 650.0000 mg | ORAL_TABLET | Freq: Four times a day (QID) | ORAL | Status: DC | PRN
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
## 2022-04-05 NOTE — ED Notes (Signed)
Pt Changed out into purple scrubs. Belongings have been removed from pt.

## 2022-04-05 NOTE — ED Notes (Signed)
Pt brought with him a back pack and a change of clothes. One belongings bag and one back pack have been stored at nurses station of 1-8.

## 2022-04-05 NOTE — ED Notes (Signed)
Patient had unhooked himself from IV fluid and monitor. Walking around in room saying he has to go right now. "I have to take care of things with my sister and my baby Mama" per pt. Provide informed. Patient encouraged to stay and complete treatment.

## 2022-04-05 NOTE — ED Provider Notes (Signed)
[facility]-EMERGENCY DEPT Provider Note   CSN: [id number] Arrival date & time: 04/05/22  6720     History PMH: Diabetes type 1 Chief Complaint  Patient presents with   Chest Pain    [name] [name] is a 36 y.o. male.  Presents the ED with a chief complaint of chest pain.  He has had intermittent chest pain for about 3 weeks now.  He says that he notices about twice a week.  He says he most recently had it this morning when he woke up.  He says it is a sharp pain in the center of his chest that lasted for about an hour.  He has associated shortness of breath at the time.  He states that he thinks it is worse when he is lying flat, with movement, or with coughing.  He says that he is asymptomatic now.  He does note that he has had a dry cough over the past week.  He also ran out of his blood pressure medications recently.  He has had chest pains in the past, and this feels identical to those.  He has never been evaluated for this outpatient, but he does state he just got insurance that should be able to do this.  No history of blood clots. No recent illness. Denies recent alcohol or drug use.  He denies any nausea, vomiting, abdominal pain, dizziness, fevers, sore throat, congestion, leg swelling    Chest Pain Associated symptoms: cough and shortness of breath        Home Medications Prior to Admission medications   Medication Sig Start Date End Date Taking? Authorizing Provider  acetaminophen (TYLENOL) 500 MG tablet Take 1,000 mg by mouth every 6 (six) hours as needed for mild pain.    [provider]  amLODipine (NORVASC) 10 MG tablet Take 1 tablet (10 mg total) by mouth daily. 01/23/22   Margarita Grizzle, MD  Insulin Syringe-Needle U-100 Stann Ore INSULIN SYRINGE) 30G X 5/16" 0.3 ML MISC Use 3 (three) times daily with insulin 01/23/22   Margarita Grizzle, MD  lisinopril (ZESTRIL) 10 MG tablet Take 1 tablet (10 mg total) by mouth daily. 01/23/22   Margarita Grizzle,  MD  insulin NPH-regular Human (70-30) 100 UNIT/ML injection Inject 15 Units into the skin 3 (three) times daily. 01/23/22   Margarita Grizzle, MD      Allergies    Patient has no known allergies.    Review of Systems   Review of Systems  Respiratory:  Positive for cough and shortness of breath.   Cardiovascular:  Positive for chest pain.  All other systems reviewed and are negative.   Physical Exam Updated Vital Signs BP (!) 144/102   Pulse (!) 111   Temp 98.9 F (37.2 C) (Oral)   Resp 16   Ht 5\' 11"  (1.803 m)   Wt 127 kg   SpO2 97%   BMI 39.05 kg/m  Physical Exam Vitals and nursing note reviewed.  Constitutional:      General: He is not in acute distress.    Appearance: Normal appearance. He is well-developed. He is not ill-appearing, toxic-appearing or diaphoretic.  HENT:     Head: Normocephalic and atraumatic.     Nose: No nasal deformity.     Mouth/Throat:     Lips: Pink. No lesions.  Eyes:     General: Gaze aligned appropriately. No scleral icterus.       Right eye: No discharge.  Left eye: No discharge.     Conjunctiva/sclera: Conjunctivae normal.     Right eye: Right conjunctiva is not injected. No exudate or hemorrhage.    Left eye: Left conjunctiva is not injected. No exudate or hemorrhage. Neck:     Vascular: No JVD.     Trachea: No tracheal deviation.  Cardiovascular:     Rate and Rhythm: Regular rhythm. Tachycardia present.     Pulses:          Radial pulses are 2+ on the right side and 2+ on the left side.       Dorsalis pedis pulses are 2+ on the right side and 2+ on the left side.     Heart sounds: Normal heart sounds. Heart sounds not distant. No murmur heard.    No friction rub. No gallop. No S3 or S4 sounds.  Pulmonary:     Effort: Pulmonary effort is normal. No tachypnea, accessory muscle usage or respiratory distress.     Breath sounds: Normal breath sounds. No stridor. No decreased breath sounds, wheezing, rhonchi or rales.  Chest:      Chest wall: No mass.  Abdominal:     General: There is no abdominal bruit.     Palpations: Abdomen is soft. There is no mass.     Tenderness: There is no abdominal tenderness. There is no guarding or rebound.  Musculoskeletal:     Right lower leg: No tenderness. No edema.     Left lower leg: No tenderness. No edema.  Skin:    General: Skin is warm and dry.  Neurological:     General: No focal deficit present.     Mental Status: He is alert and oriented to person, place, and time.  Psychiatric:        Mood and Affect: Mood normal.        Speech: Speech normal.        Behavior: Behavior normal. Behavior is cooperative.     ED Results / Procedures / Treatments   Labs (all labs ordered are listed, but only abnormal results are displayed) Labs Reviewed  BASIC METABOLIC PANEL - Abnormal; Notable for the following components:      Result Value   Potassium 3.2 (*)    Glucose, Bld 138 (*)    All other components within normal limits  RESP PANEL BY RT-PCR (FLU A&B, COVID) ARPGX2  CBC  URINALYSIS, ROUTINE W REFLEX MICROSCOPIC  RAPID URINE DRUG SCREEN, HOSP PERFORMED  TROPONIN I (HIGH SENSITIVITY)  TROPONIN I (HIGH SENSITIVITY)    EKG None  Radiology DG Chest 2 View  Result Date: 04/05/2022 CLINICAL DATA:  Chest pain EXAM: CHEST - 2 VIEW COMPARISON:  Chest x-ray dated February 12, 2020 FINDINGS: The heart size and mediastinal contours are within normal limits. Both lungs are clear. The visualized skeletal structures are unremarkable. IMPRESSION: No active cardiopulmonary disease. Electronically Signed   By: Yetta Glassman M.D.   On: 04/05/2022 08:52    Procedures Procedures  This patient was on telemetry or cardiac monitoring during their time in the ED.    Medications Ordered in ED Medications  potassium chloride SA (KLOR-CON M) CR tablet 40 mEq (40 mEq Oral Not Given 04/05/22 1036)  sodium chloride 0.9 % bolus 1,000 mL (0 mLs Intravenous Stopped 04/05/22 1015)    ED Course/  Medical Decision Making/ A&P Clinical Course as of 04/05/22 1142  Fri Apr 05, 2022  0836 RN came to me with concern for track marks in veins.  Patient continues to denies drug use to me. Will add on UDS [GL]    Clinical Course User Index [GL] Marsean Elkhatib, Finis Bud, PA-C                           Medical Decision Making Amount and/or Complexity of Data Reviewed Labs: ordered. Radiology: ordered.  Risk Prescription drug management.    MDM  This is a 36 y.o. male who presents to the ED with chest pain The differential of this patient includes but is not limited to ACS, PE, PTX, Pericarditis, GERD, PNA, viral infection, cocaine use, Endocarditis  Initial Impression  Well appearing, no acute distress He is tachycardic to 111, O2 97% on RA, afebrile. BP is actually normal here despite him not taking his home medications. Exam fairly unremarkable and he is asymptomatic now.   I have low suspicion for PE as symptoms have been intermittent and ongoing for several weeks. His wells score is 1.5 and low risk. I don't think we need to obtain CTA chest today since he has no symptoms right now. I have reviewed his records and it looks like he has been seen several times for similar atypical chest pains and had normal workups including negative CTA chests. He has a history of cocaine use that was thought to be contributing to his symptoms. I have added on UDS to check this.   I personally ordered, reviewed, and interpreted all laboratory work and imaging and agree with radiologist interpretation. Results interpreted below:  CBC normal, BMP reveals hypokalemia 3.2, glucose is 138 with no acidosis or anion gap.  Initial troponin is 10.  Respiratory panel negative for COVID and flu.  Chest x-ray is normal. EKG nonischemic, ST  Assessment/Plan:    Patient has normal workup so far, however, patient wanted to leave AMA prior to his second troponin and urine studies.  He refused potassium replacement. He has  been advised that his work-up was not complete that he is leaving AGAINST MEDICAL ADVICE.   Charting Requirements Additional history is obtained from:  Independent historian External Records from outside source obtained and reviewed including: Prior labs Social Determinants of Health:  Alcoholism/Drug Addiction Pertinant PMH that complicates patient's illness: Hx of Polysubstance use  Patient Care Problems that were addressed during this visit: - Nonspecific Chest Pain: Acute illness with complication This patient was maintained on a cardiac monitor/telemetry. I personally viewed and interpreted the cardiac monitor which reveals an underlying rhythm of ST Medications given in ED: IVF Reevaluation of the patient after these medicines showed that the patient improved I have reviewed home medications and made changes accordingly.  Critical Care Interventions: n/a Consultations: n/a Disposition: discharge  Portions of this note were generated with Dragon dictation software. Dictation errors may occur despite best attempts at proofreading.     Final Clinical Impression(s) / ED Diagnoses Final diagnoses:  Nonspecific chest pain    Rx / DC Orders ED Discharge Orders     None         Claudie Leach, PA-C 04/05/22 1142    Lorre Nick, MD 04/08/22 1010

## 2022-04-05 NOTE — BH Assessment (Signed)
Comprehensive Clinical Assessment (CCA) Note  04/05/2022 [name] [name] [id number] DISPOSITIONFreida Busman NP recommends an inpatient admission to assist with stabilization. Patient has been sent to [facility] for medical clearance.   Flowsheet Row ED from 04/05/2022 in [facility] Most recent reading at 04/05/2022  6:16 PM ED from 04/05/2022 in [facility] Most recent reading at 04/05/2022  7:52 AM ED from 01/23/2022 in [facility] Most recent reading at 01/23/2022 12:22 PM  C-SSRS RISK CATEGORY No Risk No Risk No Risk     The patient demonstrates the following risk factors for suicide: Chronic risk factors for suicide include: N/A. Acute risk factors for suicide include: N/A. Protective factors for this patient include: coping skills. Considering these factors, the overall suicide risk at this point appears to be low. Patient is appropriate for outpatient follow up.    Patient presents to [facility] by [employer] with IVC. Per IVC: Respondent was released from jail earlier this date and later returned attempted to re-enter the jail stating that "someone was after him trying to kill him." Respondent was also "staring into no where." IVC was initiated by jail intake staff. Patient presents very suspicious and paranoid this date blocking the assessment door although did allow this writer to eventually enter. Patient was observed to be disorganized and rendered limited history. Patient did deny any S/I, H/I or AVH. As this Clinical research associate attempted to ask assessment questions patient would not respond beyond answering above questions which he replied, "No. No No too." And then would not respond to any further questions. Patient was observed to just sit and stare at this writer periodically answer 'No" to some questions. When asked why patient appeared to be paranoid patient reported "Andria Frames." Patient would not elaborate on content of statement.  Patient has a limited psychiatric history per chart review although per notes, was seen earlier this date at [facility] when he presented with chest pain although later left AMA. Patient denies any SA history or mental health diagnosis. Again patient would answer "No" to most questions. Additional history could not be obtained at the time of assessment.   Patient would not respond to orientation questions. Patient renders limited history and speaks in a low soft voice. Patient is observed to be suspicious. Patient's memory appears to be impaired with thoughts disorganized. Patient's mood is paranoid with affect congruent. Patient does not appear to be responding to internal stimuli.     Chief Complaint:  Chief Complaint  Patient presents with   IVC   Visit Diagnosis: Altered mental state     CCA Screening, Triage and Referral (STR)  Patient Reported Information How did you hear about [place]? Legal System  What Is the Reason for Your Visit/Call Today? Pt brought in with AMS by [employer]. Pt is with IVC that was initiated by jail staff earlier this date when patient was released he was trying to get back in stating people were trying to kill him.  How Long Has This Been Causing You Problems? <Week  What Do You Feel Would Help You the Most Today? Treatment for Depression or other mood problem   Have You Recently Had Any Thoughts About Hurting Yourself? No  Are You Planning to Commit Suicide/Harm Yourself At This time? No   Have you Recently Had Thoughts About Hurting Someone Karolee Ohs? No  Are You Planning to Harm Someone at This Time? No  Explanation: No data recorded  Have You Used Any Alcohol or  Drugs in the Past 24 Hours? No  How Long Ago Did You Use Drugs or Alcohol? No data recorded What Did You Use and How Much? No data recorded  Do You Currently Have a Therapist/Psychiatrist? No  Name of Therapist/Psychiatrist: No data recorded  Have You Been Recently Discharged From Any Office Practice  or Programs? No  Explanation of Discharge From Practice/Program: No data recorded    CCA Screening Triage Referral Assessment Type of Contact: Face-to-Face  Telemedicine Service Delivery:   Is this Initial or Reassessment? No data recorded Date Telepsych consult ordered in [facility]:  No data recorded Time Telepsych consult ordered in [facility]:  No data recorded Location of Assessment: [facility]  Provider Location: [facility] Assessment Services   Collateral Involvement: None at this time   Does Patient Have a Automotive engineer Guardian? No data recorded Name and Contact of Legal Guardian: No data recorded If Minor and Not Living with Parent(s), Who has Custody? NA  Is CPS involved or ever been involved? Never  Is APS involved or ever been involved? Never   Patient Determined To Be At Risk for Harm To Self or Others Based on Review of Patient Reported Information or Presenting Complaint? No  Method: No data recorded Availability of Means: No data recorded Intent: No data recorded Notification Required: No data recorded Additional Information for Danger to Others Potential: No data recorded Additional Comments for Danger to Others Potential: No data recorded Are There Guns or Other Weapons in Your Home? No data recorded Types of Guns/Weapons: No data recorded Are These Weapons Safely Secured?                            No data recorded Who Could Verify You Are Able To Have These Secured: No data recorded Do You Have any Outstanding Charges, Pending Court Dates, Parole/Probation? No data recorded Contacted To Inform of Risk of Harm To Self or Others: Other: Comment (NA)    Does Patient Present under Involuntary Commitment? Yes  IVC Papers Initial File Date: 04/05/22   [place] of Residence: [place]   Patient Currently Receiving the Following Services: Not Receiving Services   Determination of Need: Urgent (48 hours)   Options For Referral: Inpatient  Hospitalization     CCA Biopsychosocial Patient Reported Schizophrenia/Schizoaffective Diagnosis in Past: No   Strengths: UTA   Mental Health Symptoms Depression:   Difficulty Concentrating   Duration of Depressive symptoms:  Duration of Depressive Symptoms: Less than two weeks   Mania:   None   Anxiety:    Difficulty concentrating   Psychosis:   Delusions   Duration of Psychotic symptoms:  Duration of Psychotic Symptoms: Less than six months   Trauma:   None   Obsessions:   None   Compulsions:   None   Inattention:   None   Hyperactivity/Impulsivity:   None   Oppositional/Defiant Behaviors:   None   Emotional Irregularity:   None   Other Mood/Personality Symptoms:   NA    Mental Status Exam Appearance and self-care  Stature:   Average   Weight:   Average weight   Clothing:   Disheveled   Grooming:   Neglected   Cosmetic use:   None   Posture/gait:   Bizarre   Motor activity:   Restless; Agitated   Sensorium  Attention:   Confused   Concentration:   Anxiety interferes   Orientation:   Person; Place;  Situation   Recall/memory:   Normal   Affect and Mood  Affect:   Anxious   Mood:   Anxious   Relating  Eye contact:   Fleeting   Facial expression:   Anxious   Attitude toward examiner:   Suspicious   Thought and Language  Speech flow:  Pressured   Thought content:   Suspicious   Preoccupation:   None   Hallucinations:   None   Organization:  No data recorded  [employer] of Knowledge:   Poor   Intelligence:   Needs investigation   Abstraction:   Normal   Judgement:   Poor   Reality Testing:   Distorted   Insight:   Poor   Decision Making:   Vacilates   Social Functioning  Social Maturity:   Responsible   Social Judgement:   Heedless   Stress  Stressors:   Family conflict   Coping Ability:   Human resources officer Deficits:   Activities of daily  living   Supports:   Support needed     Religion: Religion/Spirituality Are You A Religious Person?: No How Might This Affect Treatment?: NA  Leisure/Recreation: Leisure / Recreation Do You Have Hobbies?: No  Exercise/Diet: Exercise/Diet Do You Exercise?: No Have You Gained or Lost A Significant Amount of Weight in the Past Six Months?: No Do You Follow a Special Diet?: No Do You Have Any Trouble Sleeping?: No   CCA Employment/Education Employment/Work Situation: Employment / Work Situation Employment Situation: Unemployed Patient's Job has Been Impacted by Current Illness: No Has Patient ever Been in Equities trader?: No  Education: Education Is Patient Currently Attending School?: No Last Grade Completed:  (UTA) Did You Attend College?:  (UTA) Did You Have An Individualized Education Program (IIEP):  (UTA) Did You Have Any Difficulty At School?:  (UTA) Patient's Education Has Been Impacted by Current Illness:  (UTA)   CCA Family/Childhood History Family and Relationship History: Family history Marital status: Long term relationship Long term relationship, how long?: 2 years although patient states they are currently not seeing each other What types of issues is patient dealing with in the relationship?: Pt has AMS and is disorganized UTA Additional relationship information: NA Does patient have children?: Yes How many children?: 5 How is patient's relationship with their children?: UTA due to AMS  Childhood History:  Childhood History By whom was/is the patient raised?:  (UTA) Did patient suffer any verbal/emotional/physical/sexual abuse as a child?:  (UTA) Did patient suffer from severe childhood neglect?:  (UTA) Has patient ever been sexually abused/assaulted/raped as an adolescent or adult?:  (UTA) Was the patient ever a victim of a crime or a disaster?:  (UTA) Witnessed domestic violence?:  (UTA) Has patient been affected by domestic violence as an  adult?:  Industrial/product designer)  Child/Adolescent Assessment:     CCA Substance Use Alcohol/Drug Use: Alcohol / Drug Use Pain Medications: See MAR Prescriptions: See MAR Over the Counter: See MAR History of alcohol / drug use?: No history of alcohol / drug abuse                         ASAM's:  Six Dimensions of Multidimensional Assessment  Dimension 1:  Acute Intoxication and/or Withdrawal Potential:      Dimension 2:  Biomedical Conditions and Complications:      Dimension 3:  Emotional, Behavioral, or Cognitive Conditions and Complications:     Dimension 4:  Readiness to Change:  Dimension 5:  Relapse, Continued use, or Continued Problem Potential:     Dimension 6:  Recovery/Living Environment:     ASAM Severity Score:    ASAM Recommended Level of Treatment:     Substance use Disorder (SUD)    Recommendations for Services/Supports/Treatments:    Discharge Disposition:    DSM5 Diagnoses: Patient Active Problem List   Diagnosis Date Noted   Hypoglycemia 07/02/2019     Referrals to Alternative Service(s): Referred to Alternative Service(s):   Place:   Date:   Time:    Referred to Alternative Service(s):   Place:   Date:   Time:    Referred to Alternative Service(s):   Place:   Date:   Time:    Referred to Alternative Service(s):   Place:   Date:   Time:     Alfredia Ferguson, LCAS

## 2022-04-05 NOTE — ED Notes (Signed)
Patient transported to x-ray. ?

## 2022-04-05 NOTE — ED Triage Notes (Signed)
Pt presents to [facility] under IVC. IVC states " Respondent was released from [place], stood outside of the jail door and was trying to break back into the jail. He stated people were trying to kill him as he was the only one standing outside at the time. He had a blank stare upon his face as if he was in another world". Pt was asked about paranoia, pt states he did experience paranoia today. Pt denies SI/HI and AVH.

## 2022-04-05 NOTE — ED Notes (Signed)
Patient verbally said he wanted to leave AMA after risks explained. Witnessed by ED tech. Patient left prior to signing.

## 2022-04-05 NOTE — Discharge Instructions (Signed)
You were seen today for chest pain. You have decided to leave prior to finishing your workup today.  You have chosen to leave AGAINST MEDICAL ADVICE. Should you change your mind, you are always welcome and encouraged to return to the ED. You are encouraged to follow-up with, at the very least, a primary care provider, or other similar medical professional on this matter.

## 2022-04-05 NOTE — ED Triage Notes (Signed)
Pt arrived via POV, c/o intermittent chest pain, SOB, HTN x4 days. Worsening with mvmt and laying flat. States recently ran out of HTN meds.

## 2022-04-05 NOTE — ED Provider Notes (Signed)
Behavioral Health Urgent Care Medical Screening Exam  Patient Name: [name] [name] MRN: [id number] Date of Evaluation: 04/05/22 Chief Complaint:   Diagnosis:  Final diagnoses:  Paranoia (HCC)    History of Present illness: [name] [name] is a 36 y.o. male.  Patient presents under involuntary commitment, transported by Patent examiner.  Patient petitioned by [employer] who is jail staff.  Petition reads: "Respondent was released from jail, stood outside the jail door and was trying to break back into the jail.  He stated people were trying to kill him as he was the only one standing outside at the time.  He had a blank stare up on his face as if he was in another world."  Patient assessed, face-to-face, by nurse practitioner.  Patient seated in observation area upon my approach, no apparent distress.  He is alert and oriented, cooperative during assessment.  Naveen reports earlier at the jail he felt "that someone was trying to kill me."  He is unable to define reasons for thinking that someone was trying to kill him.  Denies history of similar episode.  He denies auditory and visual hallucinations currently.  He continues to report paranoia and exhibit paranoid behaviors.  Patient visualized standing behind the assessment room door looking through the window when this writer left the room.  Patient reports he was placed in jail after he was "partying" at the home of a friend last night.  He remained in jail only for "a couple hours."  Patient reports he typically uses cocaine daily.  Most recent cocaine use on last night.  Patient reports he was unable to fall asleep last night and sleep has been decreased for several days.  He also endorses drinking alcohol intermittently.  Most recent alcohol use 2 weeks ago.  Patient denies personal mental health history.  No current medications to address mood.  He denies history of inpatient psychiatric hospitalization.  Family mental health history  include includes patient's maternal uncle who "had hallucinations."  Doc reports passive suicidal ideation since the death of his mother 4 months ago.  He states "the thought runs through my head that I wish God would take me away."  Patient reports he typically has these thoughts when thinking of his mother.  Patient reports that he was unable to attend his mother's funeral.  He denies suicidal and homicidal ideations currently.  He denies history of suicide attempts, denies history of nonsuicidal self-harm behavior.  He is insightful today and reports he has so much to live for he would not hurt himself patient has a 32-year-old daughter, a 69-year-old daughter and a 72-month-old daughter.  Recent stressors include his current girlfriend is not aware that he has a 5-month-old daughter.  Patient states "it is a good feeling to get that off my chest."  Daley denies current medications aside from lisinopril and insulin.  He was last compliant with his insulin 2 days ago, he was last compliant with his lisinopril 2 weeks ago.  Patient reports dizziness and agrees with plan for follow-up at the emergency department.  Patient currently resides in [place] with his girlfriend, girlfriend and son and girlfriend's son's girlfriend.  He denies access to weapons.  He is not employed, recently lost job in [employer].  He endorses average appetite.  Patient offered support and encouragement.  Patient verbalizes understanding of treatment plan to include medical clearance in the emergency department related to dizziness and feeling dehydrated.  Patient seen in emergency department  earlier this date however left AMA.  He gives verbal consent to speak with his "daughter's mother" Irving Burton who resides in [place].  Attempted to reach family phone number [phone], apparently this was the wrong number.  Psychiatric Specialty Exam  Presentation  General Appearance:Disheveled  Eye  Contact:Fair  Speech:Clear and Coherent; Normal Rate  Speech Volume:Normal  Handedness:Right   Mood and Affect  Mood:Depressed  Affect:Depressed   Thought Process  Thought Processes:Coherent  Descriptions of Associations:Intact  Orientation:Full (Time, Place and Person)  Thought Content:Paranoid Ideation  Diagnosis of Schizophrenia or Schizoaffective disorder in past: No  Duration of Psychotic Symptoms: Less than six months  Hallucinations:None  Ideas of Reference:Paranoia  Suicidal Thoughts:Yes, Passive Without Intent; Without Plan  Homicidal Thoughts:No   Sensorium  Memory:Immediate Fair  Judgment:Impaired  Insight:Lacking   Executive Functions  Concentration:Poor  Attention Span:Poor  Recall:Fair  [employer] of Knowledge:Fair  Language:Fair   Psychomotor Activity  Psychomotor Activity:Normal   Assets  Assets:Communication Skills; Desire for Improvement; Financial Resources/Insurance; Housing; Intimacy; Leisure Time; Social Support; Physical Health; Resilience   Sleep  Sleep:Poor  Number of hours: No data recorded  Nutritional Assessment (For OBS and FBC admissions only) Has the patient had a weight loss or gain of 10 pounds or more in the last 3 months?: No Has the patient had a decrease in food intake/or appetite?: No Does the patient have dental problems?: No Does the patient have eating habits or behaviors that may be indicators of an eating disorder including binging or inducing vomiting?: No Has the patient recently lost weight without trying?: 0 Has the patient been eating poorly because of a decreased appetite?: 0 Malnutrition Screening Tool Score: 0    Physical Exam: Physical Exam Vitals and nursing note reviewed.  Constitutional:      Appearance: Normal appearance. He is well-developed and normal weight.  HENT:     Head: Normocephalic and atraumatic.     Nose: Nose normal.  Cardiovascular:     Rate and Rhythm: Normal rate.   Pulmonary:     Effort: Pulmonary effort is normal.  Musculoskeletal:        General: Normal range of motion.     Cervical back: Normal range of motion.  Skin:    General: Skin is warm and dry.  Neurological:     Mental Status: He is alert and oriented to person, place, and time.  Psychiatric:        Attention and Perception: Attention normal.        Mood and Affect: Affect normal. Mood is depressed.        Speech: Speech normal.        Behavior: Behavior is cooperative.        Thought Content: Thought content is paranoid. Thought content includes suicidal ideation.    Review of Systems  Constitutional: Negative.   HENT: Negative.    Eyes: Negative.   Respiratory: Negative.    Cardiovascular: Negative.   Gastrointestinal: Negative.   Genitourinary: Negative.        Reports "I cannot pee, I am dehydrated"  Musculoskeletal: Negative.   Skin: Negative.   Neurological:  Positive for dizziness.  Psychiatric/Behavioral:  Positive for depression, substance abuse and suicidal ideas. The patient has insomnia.    Blood pressure (!) 148/89, pulse 100, temperature 98 F (36.7 C), temperature source Oral, resp. rate 20, SpO2 100 %. There is no height or weight on file to calculate BMI.  Musculoskeletal: Strength & Muscle Tone: within normal limits Gait & Station:  normal Patient leans: N/A   [facility] MSE Discharge Disposition for Follow up and Recommendations: Based on my evaluation the patient appears to have an emergency medical condition for which I recommend the patient be transferred to the emergency department for further evaluation.  Patient reviewed with Dr. Gretta Cool.  Involuntary commitment petition upheld and first exam completed by this Clinical research associate.  Inpatient psychiatric treatment recommended.  Patient accepted for medical clearance at [facility] emergency department by Dr. Jacqulyn Bath. Recommend consider agitation protocol, QTc measured 475 earlier this date.  Recommend follow-up  EKG.   Lenard Lance, FNP 04/05/2022, 6:22 PM

## 2022-04-05 NOTE — ED Provider Notes (Signed)
[facility]-EMERGENCY DEPT Provider Note   CSN: [id number] Arrival date & time: 04/05/22  1918     History  Chief Complaint  Patient presents with  . IVC    Clell Trahan is a 36 y.o. male.  HPI   Patient with medical history of hypertension presents today under IVC from behavioral health due to concerns about chest pain, dizzieness in the context of hypertension.  He has been off of his blood pressure medicine for some time.  He was recently discharged from jail.  Patient was seen earlier today in this emergency department for chest pain, he left AMA before the second troponin was obtained and the for troponin was 10.  He was here for 3 weeks of intermittent chest pain which comes and goes, worse in the morning and sometimes when he lays down flat, moves or coughs.  Denies any specific pleuritic pain.  Upon leaving the emergency department he behaved erratically. Patient was exhibiting paranoid and manic behavior. "Respondent was released from jail, stood outside the jail door and was trying to break back into the jail.  He stated people were trying to kill him as he was the only one standing outside at the time.  He had a blank stare up on his face as if he was in another world."  Patient is excepted to behavioral health pending medical evaluation and improvement of his blood pressure.  Home Medications Prior to Admission medications   Not on File      Allergies    Patient has no allergy information on record.    Review of Systems   Review of Systems  Physical Exam Updated Vital Signs BP 138/89 (BP Location: Right Wrist)   Pulse (!) 105   Temp 98.8 F (37.1 C) (Oral)   Resp 18   SpO2 96%  Physical Exam Vitals and nursing note reviewed. Exam conducted with a chaperone present.  Constitutional:      Appearance: Normal appearance.  HENT:     Head: Normocephalic and atraumatic.  Eyes:     General: No scleral icterus.       Right eye: No discharge.         Left eye: No discharge.     Extraocular Movements: Extraocular movements intact.     Pupils: Pupils are equal, round, and reactive to light.  Cardiovascular:     Rate and Rhythm: Regular rhythm. Tachycardia present.     Pulses: Normal pulses.     Heart sounds: Normal heart sounds. No murmur heard.    No friction rub. No gallop.     Comments: Mildly tachycardic Pulmonary:     Effort: Pulmonary effort is normal. No respiratory distress.     Breath sounds: Normal breath sounds.  Abdominal:     General: Abdomen is flat. Bowel sounds are normal. There is no distension.     Palpations: Abdomen is soft.     Tenderness: There is no abdominal tenderness.  Skin:    General: Skin is warm and dry.     Coloration: Skin is not jaundiced.  Neurological:     Mental Status: He is alert. Mental status is at baseline.     Coordination: Coordination normal.     Comments: Cranial nerves II through XII are gross intact, grips ankle symmetric bilaterally.  Lower extremity strength symmetric bilaterally.     ED Results / Procedures / Treatments   Labs (all labs ordered are listed, but only abnormal results are displayed) Labs Reviewed  COMPREHENSIVE METABOLIC PANEL - Abnormal; Notable for the following components:      Result Value   CO2 21 (*)    Glucose, Bld 287 (*)    Creatinine, Ser 1.25 (*)    Total Bilirubin 1.5 (*)    All other components within normal limits  SALICYLATE LEVEL - Abnormal; Notable for the following components:   Salicylate Lvl <7.0 (*)    All other components within normal limits  ACETAMINOPHEN LEVEL - Abnormal; Notable for the following components:   Acetaminophen (Tylenol), Serum <10 (*)    All other components within normal limits  RAPID URINE DRUG SCREEN, HOSP PERFORMED - Abnormal; Notable for the following components:   Cocaine POSITIVE (*)    Amphetamines POSITIVE (*)    All other components within normal limits  ETHANOL  CBC  TROPONIN I (HIGH  SENSITIVITY)  TROPONIN I (HIGH SENSITIVITY)    EKG EKG Interpretation  Date/Time:  Friday April 05 2022 20:16:01 EDT Ventricular Rate:  108 PR Interval:  126 QRS Duration: 106 QT Interval:  358 QTC Calculation: 479 R Axis:   -26 Text Interpretation: Sinus tachycardia Incomplete right bundle branch block T wave abnormality, consider inferolateral ischemia Abnormal ECG No previous ECGs available Confirmed by Vivien Rossetti ([id number]) on 04/05/2022 8:26:56 PM  Radiology No results found.  Procedures Procedures    Medications Ordered in ED Medications  amLODipine (NORVASC) tablet 10 mg (10 mg Oral Given 04/05/22 2123)  alum & mag hydroxide-simeth (MAALOX/MYLANTA) 200-200-20 MG/5ML suspension 30 mL (30 mLs Oral Given 04/05/22 2123)    And  lidocaine (XYLOCAINE) 2 % viscous mouth solution 15 mL (15 mLs Oral Given 04/05/22 2123)  LORazepam (ATIVAN) injection 1 mg (1 mg Intravenous Given 04/05/22 2123)    ED Course/ Medical Decision Making/ A&P Clinical Course as of 04/06/22 2107  Fri Apr 05, 2022  2006 I reviewed external records from behavioral health eval earlier today.  Also reviewed visit from the ED earlier today.  Patient's initial troponin was 10, he left AMA before getting second troponin completed. [HS]  2006 I reviewed chest x-ray that was ordered earlier today.  Negative for any acute process or cardiomegaly. [HS]  2032 Rapid urine drug screen (hospital performed)(!) UDS positive for cocaine and amphetamines [HS]  2033 cbc CBC with no leukocytosis or anemia [HS]  2033 Comprehensive metabolic panel(!) Hyperglycemia of 287, greater than 250 random glucose concerning for possible new onset of diabetes.  Creatinine is slightly elevated at 1.25. [HS]  2149 Troponin I (High Sensitivity) Troponin 9, troponin from earlier today is 10.  Given flat troponin do not think another troponin for trending is indicated.  Patient does not appear consistent with ACS [HS]    Clinical Course  User Index [HS] Theron Arista, PA-C                           Medical Decision Making Amount and/or Complexity of Data Reviewed Labs: ordered. Decision-making details documented in ED Course. Radiology: ordered.  Risk OTC drugs. Prescription drug management.   Patient presents due to evaluation for hypertension, chest pain and dizziness.  Differential is broad and includes hypertensive urgency, hypertensive emergency, ACS, PE, hypertension the setting of medical noncompliance. I reviewed external records including behalf, previous ED provider note as documented in the HPI.  On my exam patient is mildly tachycardic and hypertensive.  There are no focal deficits, his lungs are clear to auscultation bilaterally in his upper and lower  extremities are symmetric and 2+ bilaterally.  Given patient's initial troponin was detectable at 10 we will get a second troponin.  Initially I was going to get a dimer but patient has signs of right heart strain on his EKG concerning for possible PE in the context of hypertension, tachycardia.  We will proceed with CTA PE study for further evaluation.  We will also give Ativan given patient is also presenting with chest pain in the setting of cocaine use earlier today.  Patient's home medicine was ordered.  I reviewed patient's home medicine, he is prescribed 10 mg of amlodipine daily and 10 of lisinopril.  Lisinopril was given at urgent care according to patient.  Patient is not on any blood thinners, denies history of diabetes.  Patient given home dose of amlodipine at this time.    I ordered, viewed and interpreted laboratory work-up as documented in the ED course.    I reassessed the patient, he is feeling somewhat improved.  He is still mildly tachycardic at 105 but his blood pressure is significantly improved at 138/89.    Patient disposition is pending CTA PE study.  If the PE study is negative for any acute cardiac process and his blood pressure remains  improved I think patient is appropriate to return back to [facility].          Final Clinical Impression(s) / ED Diagnoses Final diagnoses:  None    Rx / DC Orders ED Discharge Orders     None         Theron Arista, Cordelia Poche 04/06/22 2107    Mardene Sayer, MD 04/07/22 1108

## 2022-04-05 NOTE — ED Notes (Signed)
[employer] called to take to [facility]

## 2022-04-06 ENCOUNTER — Encounter (HOSPITAL_COMMUNITY): Payer: Self-pay | Admitting: Emergency Medicine

## 2022-04-06 DIAGNOSIS — F1994 Other psychoactive substance use, unspecified with psychoactive substance-induced mood disorder: Secondary | ICD-10-CM | POA: Diagnosis present

## 2022-04-06 LAB — BASIC METABOLIC PANEL
Anion gap: 9 (ref 5–15)
BUN: 16 mg/dL (ref 6–20)
CO2: 25 mmol/L (ref 22–32)
Calcium: 9 mg/dL (ref 8.9–10.3)
Chloride: 98 mmol/L (ref 98–111)
Creatinine, Ser: 1.09 mg/dL (ref 0.61–1.24)
GFR, Estimated: >60 mL/min (ref >60)
Glucose, Bld: 470 mg/dL — ABNORMAL HIGH (ref 70–99)
Potassium: 4.4 mmol/L (ref 3.5–5.1)
Sodium: 132 mmol/L — ABNORMAL LOW (ref 135–145)

## 2022-04-06 LAB — BLOOD GAS, VENOUS
Acid-Base Excess: 4.4 mmol/L — ABNORMAL HIGH (ref 0.0–2.0)
Bicarbonate: 29.2 mmol/L — ABNORMAL HIGH (ref 20.0–28.0)
O2 Saturation: 94.6 %
Patient temperature: 37
pCO2, Ven: 43 mmHg — ABNORMAL LOW (ref 44–60)
pH, Ven: 7.44 — ABNORMAL HIGH (ref 7.25–7.43)
pO2, Ven: 66 mmHg — ABNORMAL HIGH (ref 32–45)

## 2022-04-06 LAB — RESP PANEL BY RT-PCR (FLU A&B, COVID) ARPGX2
Influenza A by PCR: NEGATIVE
Influenza B by PCR: NEGATIVE
SARS Coronavirus 2 by RT PCR: NEGATIVE

## 2022-04-06 LAB — CBG MONITORING, ED: Glucose-Capillary: 547 mg/dL (ref 70–99)

## 2022-04-06 MED ORDER — INSULIN ASPART 100 UNIT/ML IJ SOLN
0.0000 [IU] | Freq: Every day | INTRAMUSCULAR | Status: DC
  Filled 2022-04-06: qty 0.05

## 2022-04-06 MED ORDER — AMLODIPINE BESYLATE 5 MG PO TABS
10.0000 mg | ORAL_TABLET | Freq: Every day | ORAL | Status: DC
  Administered 2022-04-06 – 2022-04-07 (×2): 10 mg via ORAL
  Filled 2022-04-06 (×2): qty 2

## 2022-04-06 MED ORDER — ACETAMINOPHEN 325 MG PO TABS: 650.0000 mg | ORAL_TABLET | ORAL | Status: DC | PRN

## 2022-04-06 MED ORDER — INSULIN ASPART 100 UNIT/ML IJ SOLN
8.0000 [IU] | Freq: Once | INTRAMUSCULAR | Status: AC
  Administered 2022-04-06: 8 [IU] via SUBCUTANEOUS
  Filled 2022-04-06: qty 0.08

## 2022-04-06 MED ORDER — INSULIN ASPART 100 UNIT/ML IJ SOLN
0.0000 [IU] | Freq: Three times a day (TID) | INTRAMUSCULAR | Status: DC
  Administered 2022-04-07: 11 [IU] via SUBCUTANEOUS
  Filled 2022-04-06: qty 0.15

## 2022-04-06 MED ORDER — SODIUM CHLORIDE 0.9 % IV BOLUS
1000.0000 mL | Freq: Once | INTRAVENOUS | Status: AC
  Administered 2022-04-06: 1000 mL via INTRAVENOUS

## 2022-04-06 MED ORDER — NICOTINE 21 MG/24HR TD PT24
21.0000 mg | MEDICATED_PATCH | Freq: Every day | TRANSDERMAL | Status: DC
  Administered 2022-04-07: 21 mg via TRANSDERMAL
  Filled 2022-04-06 (×2): qty 1

## 2022-04-06 NOTE — ED Notes (Signed)
Patient was given his breakfast tray.

## 2022-04-06 NOTE — ED Notes (Signed)
Patient was given his lunch tray.

## 2022-04-06 NOTE — ED Provider Notes (Signed)
22:00: Assumed care of patient from [facility] @ shift change pending CTA- plan if no acute abnormality to medically clear for return to [facility].   Please see prior provider note for full H&P. Briefly patient under IVC by behavioral health.  Ultimately was complaining of chest pain and dizziness and found to be hypertensive therefore was transported to the ED for medical clearance.  I have reviewed the patient's work-up including: CBC: Unremarkable CMP: Mildly elevated creatinine as well as hyperglycemia Ethanol/salicylate/acetaminophen levels: Unremarkable UDS: + for cocaine & amphetamines Troponin WNL, also WNL earlier today when seen in the ED --- chart requires merging- MRN: [id number]  CTA: 1. No evidence for pulmonary embolism. 2. No acute cardiopulmonary process  Patient medically cleared.  Discussed with behavioral health team- facility @ capacity, meets inpatient criteria, pending placement.   Results for orders placed or performed during the hospital encounter of 04/05/22  Comprehensive metabolic panel  Result Value Ref Range   Sodium 136 135 - 145 mmol/L   Potassium 3.7 3.5 - 5.1 mmol/L   Chloride 100 98 - 111 mmol/L   CO2 21 (L) 22 - 32 mmol/L   Glucose, Bld 287 (H) 70 - 99 mg/dL   BUN 18 6 - 20 mg/dL   Creatinine, Ser 2.35 (H) 0.61 - 1.24 mg/dL   Calcium 9.2 8.9 - 57.3 mg/dL   Total Protein 7.6 6.5 - 8.1 g/dL   Albumin 4.1 3.5 - 5.0 g/dL   AST 21 15 - 41 U/L   ALT 13 0 - 44 U/L   Alkaline Phosphatase 70 38 - 126 U/L   Total Bilirubin 1.5 (H) 0.3 - 1.2 mg/dL   GFR, Estimated >22 >02 mL/min   Anion gap 15 5 - 15  Ethanol  Result Value Ref Range   Alcohol, Ethyl (B) <10 <10 mg/dL  Salicylate level  Result Value Ref Range   Salicylate Lvl <7.0 (L) 7.0 - 30.0 mg/dL  Acetaminophen level  Result Value Ref Range   Acetaminophen (Tylenol), Serum <10 (L) 10 - 30 ug/mL  cbc  Result Value Ref Range   WBC 7.5 4.0 - 10.5 K/uL   RBC 5.50 4.22 - 5.81 MIL/uL   Hemoglobin 15.7  13.0 - 17.0 g/dL   HCT 54.2 70.6 - 23.7 %   MCV 85.8 80.0 - 100.0 fL   MCH 28.5 26.0 - 34.0 pg   MCHC 33.3 30.0 - 36.0 g/dL   RDW 62.8 31.5 - 17.6 %   Platelets 217 150 - 400 K/uL   nRBC 0.0 0.0 - 0.2 %  Rapid urine drug screen (hospital performed)  Result Value Ref Range   Opiates NONE DETECTED NONE DETECTED   Cocaine POSITIVE (A) NONE DETECTED   Benzodiazepines NONE DETECTED NONE DETECTED   Amphetamines POSITIVE (A) NONE DETECTED   Tetrahydrocannabinol NONE DETECTED NONE DETECTED   Barbiturates NONE DETECTED NONE DETECTED  Troponin I (High Sensitivity)  Result Value Ref Range   Troponin I (High Sensitivity) 9 <18 ng/L   CT Angio Chest PE W/Cm &/Or Wo Cm  Result Date: 04/05/2022 CLINICAL DATA:  High probability for PE. EXAM: CT ANGIOGRAPHY CHEST WITH CONTRAST TECHNIQUE: Multidetector CT imaging of the chest was performed using the standard protocol during bolus administration of intravenous contrast. Multiplanar CT image reconstructions and MIPs were obtained to evaluate the vascular anatomy. RADIATION DOSE REDUCTION: This exam was performed according to the departmental dose-optimization program which includes automated exposure control, adjustment of the mA and/or kV according to patient size and/or  use of iterative reconstruction technique. CONTRAST:  OMNIPAQUE IOHEXOL 350 MG/ML SOLN COMPARISON:  CT angiogram chest 08/28/2021 FINDINGS: Cardiovascular: Satisfactory opacification of the pulmonary arteries to the segmental level. No evidence of pulmonary embolism. Normal heart size. No pericardial effusion. Mediastinum/Nodes: No enlarged mediastinal, hilar, or axillary lymph nodes. Thyroid gland, trachea, and esophagus demonstrate no significant findings. Lungs/Pleura: Lungs are clear. No pleural effusion or pneumothorax. Upper Abdomen: No acute abnormality. Musculoskeletal: No chest wall abnormality. No acute or significant osseous findings. Review of the MIP images confirms the above  findings. IMPRESSION: 1. No evidence for pulmonary embolism. 2. No acute cardiopulmonary process. Electronically Signed   By: Darliss Cheney M.D.   On: 04/05/2022 22:17          Cherly Anderson, PA-C 04/06/22 6803    Zadie Rhine, MD 04/06/22 [id number]

## 2022-04-06 NOTE — ED Provider Notes (Signed)
Told nursing staff that he is a diabetic.  Supposedly this information was not given overnight.  He has been awaiting for psychiatric evaluation.  Blood sugar was rechecked and is in the 500s.  We will get VBG, BMP, give IV fluids.  We will see if he is in DKA or not.  If he is not we will put him on sliding scale insulin.  Per my review of his BMP blood sugar was 470 however anion gap is normal.  pH is unremarkable.  Patient not in DKA.  We will give him 8 units of NovoLog and place him on sliding scale insulin.  Patient medically cleared at this time.  This chart was dictated using voice recognition software.  Despite best efforts to proofread,  errors can occur which can change the documentation meaning.    Virgina Norfolk, DO 04/06/22 2008

## 2022-04-06 NOTE — Consult Note (Signed)
[facility]   Reason for Consult:   Referring Physician:   Patient Identification: [name] [name] MRN:  [id number] ED Chief Complaint: Substance induced mood disorder (HCC)  Diagnosis:  Principal Problem:   Substance induced mood disorder [facility](HCC)   ED Assessment Time Calculation: Start Time: 1700 Stop Time: 1720 Total Time in Minutes (Assessment Completion): 20   Subjective:   [name] [name] is a 36 y.o. male who [facility]. Patient petitioned by [employer] who is jail staff. Petition reads: "Respondent was released from jail, stood outside the jail door and was trying to break back into the jail.  He stated people were trying to kill him as he was the only one standing outside at the time.  He had a blank stare up on his face as if he was in another world." [facility]   Patient has two medical records. See MRN: [id number] for history.  HPI:   On evaluation this evening, the patient is sitting on the side of his bed. He is alert and oriented x 4. He is calm and cooperative. He reports his mood as euthymic. Affect is congruent with mood. Thought process is coherent. Thought content is logical. Denies auditory and visual hallucinations. No indication that he is responding to internal stimuli. No delusions elicited during this assessment. Denies suicidal ideations. Denies homicidal ideations.   Patient denies substance abuse history. UDS +cocaine +amphetamines. BAL <10.  [name] [name] reports passive suicidal ideation since the death of his mother 4 months ago.  He states "the thought runs through my head that I wish God would take me away."  Patient denies current suicidal ideations. Reports he has so much to live for he would not hurt himself. Patient has a 37-year-old daughter, a 37-year-old daughter and a 2657-month-old daughter.  Patient reports that he was unable to attend his mother's funeral.  He denies  suicidal and homicidal ideations currently.  He denies history of suicide attempts, denies history of nonsuicidal self-harm behavior.   Past Psychiatric History: Patient denies personal mental health history.  No current medications to address mood.  He denies history of inpatient psychiatric hospitalization.  Family mental health history include includes patient's maternal uncle who "had hallucinations."  Risk to Self or Others: Is the patient at risk to self? No Has the patient been a risk to self in the past 6 months? Yes Has the patient been a risk to self within the distant past? No Is the patient a risk to others? No Has the patient been a risk to others in the past 6 months? No Has the patient been a risk to others within the distant past? No  [place] Scale:  Flowsheet Row ED [facility]  C-SSRS RISK CATEGORY No Risk       AIMS:  , , ,  ,   ASAM:    Substance Abuse:     Past Medical History:  Past Medical History:  Diagnosis Date   Diabetes mellitus without complication (HCC)    Family History: No family history on file.  Social History:  Social History   Substance and Sexual Activity  Alcohol Use None     Social History   Substance and Sexual Activity  Drug Use Not on file    Social History   Socioeconomic History   Marital status: Single    Spouse name: Not on file   Number  of children: Not on file   Years of education: Not on file   Highest education level: Not on file  Occupational History   Not on file  Tobacco Use   Smoking status: Not on file   Smokeless tobacco: Not on file  Substance and Sexual Activity   Alcohol use: Not on file   Drug use: Not on file   Sexual activity: Not on file  Other Topics Concern   Not on file  Social History Narrative   Not on file   Social Determinants of Health   Financial Resource Strain: Not on file  Food Insecurity: Not on file  Transportation Needs:  Not on file  Physical Activity: Not on file  Stress: Not on file  Social Connections: Not on file   Additional Social History:    Allergies:  Not on File  Labs:  Results for orders placed or performed during the hospital encounter of 04/05/22 (from the past 48 hour(s))  Rapid urine drug screen (hospital performed)     Status: Abnormal   Collection Time: 04/05/22  7:36 PM  Result Value Ref Range   Opiates NONE DETECTED NONE DETECTED   Cocaine POSITIVE (A) NONE DETECTED   Benzodiazepines NONE DETECTED NONE DETECTED   Amphetamines POSITIVE (A) NONE DETECTED   Tetrahydrocannabinol NONE DETECTED NONE DETECTED   Barbiturates NONE DETECTED NONE DETECTED    Comment: (NOTE) DRUG SCREEN FOR MEDICAL PURPOSES ONLY.  IF CONFIRMATION IS NEEDED FOR ANY PURPOSE, NOTIFY LAB WITHIN 5 DAYS.  LOWEST DETECTABLE LIMITS FOR URINE DRUG SCREEN Drug Class                     Cutoff (ng/mL) Amphetamine and metabolites    1000 Barbiturate and metabolites    200 Benzodiazepine                 200 Tricyclics and metabolites     300 Opiates and metabolites        300 Cocaine and metabolites        300 THC                            50 Performed at [facility], [address] [place], [place] [address]   Comprehensive metabolic panel     Status: Abnormal   Collection Time: 04/05/22  7:38 PM  Result Value Ref Range   Sodium 136 135 - 145 mmol/L   Potassium 3.7 3.5 - 5.1 mmol/L   Chloride 100 98 - 111 mmol/L   CO2 21 (L) 22 - 32 mmol/L   Glucose, Bld 287 (H) 70 - 99 mg/dL    Comment: Glucose reference range applies only to samples taken after fasting for at least 8 hours.   BUN 18 6 - 20 mg/dL   Creatinine, Ser 3.76 (H) 0.61 - 1.24 mg/dL   Calcium 9.2 8.9 - 28.3 mg/dL   Total Protein 7.6 6.5 - 8.1 g/dL   Albumin 4.1 3.5 - 5.0 g/dL   AST 21 15 - 41 U/L   ALT 13 0 - 44 U/L   Alkaline Phosphatase 70 38 - 126 U/L   Total Bilirubin 1.5 (H) 0.3 - 1.2 mg/dL   GFR, Estimated >15  >17 mL/min    Comment: (NOTE) Calculated using the CKD-EPI Creatinine Equation (2021)    Anion gap 15 5 - 15    Comment: Performed at [facility], [address], [place], [place] [address]  Ethanol     Status: None   Collection Time: 04/05/22  7:38 PM  Result Value Ref Range   Alcohol, Ethyl (B) <10 <10 mg/dL    Comment: (NOTE) Lowest detectable limit for serum alcohol is 10 mg/dL.  For medical purposes only. Performed at [facility], [address], [place], [place] [address]   Salicylate level     Status: Abnormal   Collection Time: 04/05/22  7:38 PM  Result Value Ref Range   Salicylate Lvl <7.0 (L) 7.0 - 30.0 mg/dL    Comment: Performed at [facility], [address] [place], [place] [address]  Acetaminophen level     Status: Abnormal   Collection Time: 04/05/22  7:38 PM  Result Value Ref Range   Acetaminophen (Tylenol), Serum <10 (L) 10 - 30 ug/mL    Comment: (NOTE) Therapeutic concentrations vary significantly. A range of 10-30 ug/mL  may be an effective concentration for many patients. However, some  are best treated at concentrations outside of this range. Acetaminophen concentrations >150 ug/mL at 4 hours after ingestion  and >50 ug/mL at 12 hours after ingestion are often associated with  toxic reactions.  Performed at [facility], [address] [place], [place] [address]   cbc     Status: None   Collection Time: 04/05/22  7:38 PM  Result Value Ref Range   WBC 7.5 4.0 - 10.5 K/uL   RBC 5.50 4.22 - 5.81 MIL/uL   Hemoglobin 15.7 13.0 - 17.0 g/dL   HCT 29.9 24.2 - 68.3 %   MCV 85.8 80.0 - 100.0 fL   MCH 28.5 26.0 - 34.0 pg   MCHC 33.3 30.0 - 36.0 g/dL   RDW 41.9 62.2 - 29.7 %   Platelets 217 150 - 400 K/uL   nRBC 0.0 0.0 - 0.2 %    Comment: Performed at [facility], [address], [place], [place] [address]  Troponin I (High Sensitivity)     Status: None    Collection Time: 04/05/22  7:38 PM  Result Value Ref Range   Troponin I (High Sensitivity) 9 <18 ng/L    Comment: (NOTE) Elevated high sensitivity troponin I (hsTnI) values and significant  changes across serial measurements may suggest ACS but many other  chronic and acute conditions are known to elevate hsTnI results.  Refer to the "Links" section for chest pain algorithms and additional  guidance. Performed at [facility], [address] [place], [place] [address]   Resp Panel by RT-PCR (Flu A&B, Covid) Anterior Nasal Swab     Status: None   Collection Time: 04/06/22 12:32 AM   Specimen: Anterior Nasal Swab  Result Value Ref Range   SARS Coronavirus 2 by RT PCR NEGATIVE NEGATIVE    Comment: (NOTE) SARS-CoV-2 target nucleic acids are NOT DETECTED.  The SARS-CoV-2 RNA is generally detectable in upper respiratory specimens during the acute phase of infection. The lowest concentration of SARS-CoV-2 viral copies this assay can detect is 138 copies/mL. A negative result does not preclude SARS-Cov-2 infection and should not be used as the sole basis for treatment or other patient management decisions. A negative result may occur with  improper specimen collection/handling, submission of specimen other than nasopharyngeal swab, presence of viral mutation(s) within the areas targeted by this assay, and inadequate number of viral copies(<138 copies/mL). A negative result must be combined with clinical observations, patient history, and epidemiological  information. The expected result is Negative.  Fact Sheet for Patients:  BloggerCourse.com  Fact Sheet for Healthcare Providers:  SeriousBroker.it  This test is no t yet approved or cleared by the [place] FDA and  has been authorized for detection and/or diagnosis of SARS-CoV-2 by FDA under an Emergency Use Authorization (EUA). This EUA will remain  in effect  (meaning this test can be used) for the duration of the COVID-19 declaration under Section 564(b)(1) of the Act, 21 U.S.C.section 360bbb-3(b)(1), unless the authorization is terminated  or revoked sooner.       Influenza A by PCR NEGATIVE NEGATIVE   Influenza B by PCR NEGATIVE NEGATIVE    Comment: (NOTE) The Xpert Xpress SARS-CoV-2/FLU/RSV plus assay is intended as an aid in the diagnosis of influenza from Nasopharyngeal swab specimens and should not be used as a sole basis for treatment. Nasal washings and aspirates are unacceptable for Xpert Xpress SARS-CoV-2/FLU/RSV testing.  Fact Sheet for Patients: BloggerCourse.com  Fact Sheet for Healthcare Providers: SeriousBroker.it  This test is not yet approved or cleared by the [place] FDA and has been authorized for detection and/or diagnosis of SARS-CoV-2 by FDA under an Emergency Use Authorization (EUA). This EUA will remain in effect (meaning this test can be used) for the duration of the COVID-19 declaration under Section 564(b)(1) of the Act, 21 U.S.C. section 360bbb-3(b)(1), unless the authorization is terminated or revoked.  Performed at [facility], [address] [place], [place] [address]   CBG monitoring, ED     Status: Abnormal   Collection Time: 04/06/22  6:25 PM  Result Value Ref Range   Glucose-Capillary 547 (HH) 70 - 99 mg/dL    Comment: Glucose reference range applies only to samples taken after fasting for at least 8 hours.    Current Facility-Administered Medications  Medication Dose Route Frequency Provider Last Rate Last Admin   acetaminophen (TYLENOL) tablet 650 mg  650 mg Oral Q4H PRN Petrucelli, Samantha R, PA-C       amLODipine (NORVASC) tablet 10 mg  10 mg Oral Daily Petrucelli, Samantha R, PA-C   10 mg at 04/06/22 0943   nicotine (NICODERM CQ - dosed in mg/24 hours) patch 21 mg  21 mg Transdermal Daily Petrucelli, Samantha R,  PA-C       No current outpatient medications on file.    Musculoskeletal: Strength & Muscle Tone: within normal limits Gait & Station: normal Patient leans: N/A   Psychiatric Specialty Exam: Presentation  General Appearance: Casual  Eye Contact:Good  Speech:Clear and Coherent; Normal Rate  Speech Volume:Normal  Handedness:No data recorded  Mood and Affect  Mood:Euthymic  Affect:Congruent   Thought Process  Thought Processes:Coherent; Linear  Descriptions of Associations:Intact  Orientation:Full (Time, Place and Person)  Thought Content:Logical  History of Schizophrenia/Schizoaffective disorder:No data recorded Duration of Psychotic Symptoms:No data recorded Hallucinations:Hallucinations: None  Ideas of Reference:None  Suicidal Thoughts:Suicidal Thoughts: No  Homicidal Thoughts:Homicidal Thoughts: No   Sensorium  Memory:Immediate Good; Recent Fair  Judgment:Fair  Insight:Fair   Executive Functions  Concentration:Good  Attention Span:Good  Recall:Fair  Fund of Knowledge:Good  Language:Good   Psychomotor Activity  Psychomotor Activity:Psychomotor Activity: Normal   Assets  Assets:Communication Skills; Desire for Improvement; Housing    Sleep  Sleep:Sleep: Good   Physical Exam: Physical Exam Constitutional:      General: He is not in acute distress.    Appearance: He is not ill-appearing, toxic-appearing or diaphoretic.  HENT:     Right Ear: External ear  normal.     Left Ear: External ear normal.  Eyes:     General:        Right eye: No discharge.        Left eye: No discharge.  Cardiovascular:     Rate and Rhythm: Normal rate.  Pulmonary:     Effort: Pulmonary effort is normal. No respiratory distress.  Musculoskeletal:        General: Normal range of motion.  Neurological:     Mental Status: He is alert and oriented to person, place, and time.  Psychiatric:        Thought Content: Thought content is not paranoid or  delusional. Thought content does not include homicidal or suicidal ideation.    Review of Systems  Constitutional:  Negative for chills, diaphoresis, fever, malaise/fatigue and weight loss.  Cardiovascular:  Negative for chest pain and palpitations.  Gastrointestinal:  Negative for diarrhea, nausea and vomiting.  Neurological:  Negative for dizziness and seizures.  Psychiatric/Behavioral:  Positive for depression. Negative for hallucinations and memory loss. The patient is not nervous/anxious.     Blood pressure (!) 169/94, pulse 99, temperature 98.6 F (37 C), temperature source Oral, resp. rate 18, SpO2 99 %. There is no height or weight on file to calculate BMI.  Medical Decision Making: [name] [name] is a 36 y.o. male who presented under involuntary commitment, to [facility] on 04/05/22. Patient petitioned by [employer] who is jail staff. On evaluation this evening, the patient is sitting on the side of his bed. He is alert and oriented x 4. He is calm and cooperative. He reports his mood as euthymic. Affect is congruent with mood. Thought process is coherent. Thought content is logical. Denies auditory and visual hallucinations. No indication that he is responding to internal stimuli. No delusions elicited during this assessment. Denies suicidal ideations. Denies homicidal ideations.   Problem 1: Substance induced mood disorder   Disposition:  Recommend continuous assessment overnight with reevaluation by psychiatry in the morning.   Patient in agreement with plan.  Jackelyn Poling, NP 04/06/2022 7:11 PM

## 2022-04-07 LAB — CBG MONITORING, ED: Glucose-Capillary: 322 mg/dL — ABNORMAL HIGH (ref 70–99)

## 2022-04-07 LAB — HEMOGLOBIN A1C
Hgb A1c MFr Bld: 9.8 % — ABNORMAL HIGH (ref 4.8–5.6)
Mean Plasma Glucose: 234.56 mg/dL

## 2022-04-07 NOTE — Discharge Instructions (Addendum)
Discharge recommendations:  Patient is to take medications as prescribed. Please see information for follow-up appointment with psychiatry and therapy. Please follow up with your primary care provider for all medical related needs.   Therapy: We recommend that patient participate in individual therapy to address mental health concerns.  Safety:  The patient should abstain from use of illicit substances/drugs and abuse of any medications. If symptoms worsen or do not continue to improve or if the patient becomes actively suicidal or homicidal then it is recommended that the patient return to the closest hospital emergency department, the [facility], or call 911 for further evaluation and treatment. National Suicide Prevention Lifeline 1-800-SUICIDE or (934)434-8583.  About 988 988 offers 24/7 access to trained crisis counselors who can help people experiencing mental health-related distress. People can call or text 988 or chat 988lifeline.org for themselves or if they are worried about a loved one who may need crisis support.  Crisis Mobile: [facility]:                     (901)464-0032 (for crisis response 24 hours a day) [facility] Hotline:                                            862-313-5143   ____________________________________________________ Substance Abuse Treatment Programs   Intensive Outpatient Programs [facility]                                   [address]                                                        [place], [place]                                                                         [phone]                                                     [facility] [address] [place] [address] [place], [place] [phone]   [facility]                             (Inpatient and outpatient)                                             [address]  Dr.  [phone]                 [facility] [phone] (Suboxone and Methadone)   [address]                                                           [place], [place] [address]                                       [phone]                                                     [address] [place], [place] [phone]   [facility] (Outpatient/Inpatient, [facility])                    (insurance only) [phone]                                                                                                                                    [facility] (Groups & Residential) [place], [place] [phone]                            [facility]                                        [address]                                         [place], [place]                                                           [phone]                                                     [facility] (for caregivers and family) [phone] [place], [place] [phone] Outpatient Psychiatry and  Counseling   [facility]: Mobile Crisis Management 24 hours:  [phone]   [facility] of the [employer] sliding scale fee and walk in schedule: M-F 8am-12pm/1pm-3pm [address]  [place], [place] [address] [phone]   [facility] [address] [place], [place] [address] [phone]   [facility] ([facility] [employer])- new patient walk-in appointments available Monday - Friday 8am -3pm.          [address] [place], [place] [address] [phone] or crisis line- [phone]   [facility] Health Outpatient Services/ Intensive Outpatient Therapy Program [address] [place], [place] [address] [phone]   [facility]                                                 Crisis Services                                                             [phone] [address]                                      [place], [place] [address]                                                 [facility]   [facility] [phone]. [address] [place], [place] [address]     [facility]                                                                                                             [address]  [place], [place] [address]                                                           [phone]   [facility] [address] [place], [place] [address] [phone]   [facility]  [address]                            [place], [place] [address]                                               [place], [place] [address]                                                [phone]                                         [facility] [place] [address]   [facility]                                               [address] [place], [place], [place] [address] [place], [place] [address] [place]                                               [address] [phone]  [place], [place] [address]                                                [phone]                                                     [facility] [address] [place], [place] [address] [phone] Resources for Temporary Residential Assistance/Crisis Centers   [facility]) M-F 8am-3pm   [address] [place], [place] [address]   [phone] Services include: laundry, barbering, support groups, case management, phone  & computer access, showers, AA/NA mtgs, mental health/substance abuse nurse, job skills class, disability information, VA assistance, spiritual classes, etc.

## 2022-04-07 NOTE — Discharge Summary (Signed)
[facility] Psych ED Discharge  04/07/2022 10:42 AM [name] [name]  MRN:  [id number]  Principal Problem: Substance induced mood disorder [facility]) Discharge Diagnoses: Principal Problem:   Substance induced mood disorder ([place])  Clinical Impression:  Final diagnoses:  Chest pain, unspecified type  Involuntary commitment  Substance induced mood disorder [facility])    ED Assessment Time Calculation: Start Time: 0935 Stop Time: 0950 Total Time in Minutes (Assessment Completion): [place] is a 36 y.o. male who presented under involuntary commitment, to [facility] on 04/05/22. Patient petitioned by [employer] who is jail staff. Petition reads: "Respondent was released from jail, stood outside the jail door and was trying to break back into the jail.  He stated people were trying to kill him as he was the only one standing outside at the time.  He had a blank stare up on his face as if he was in another world." Patient was transferred to [facility] for medical clearance.    Patient has two medical records. See MRN: [id number] for history.   HPI:   On evaluation this morning. He is alert and oriented x 4. He is calm and cooperative. He reports his mood as euthymic. Affect is congruent with mood. Thought process is coherent. Thought content is logical. Denies auditory and visual hallucinations. No indication that he is responding to internal stimuli. No delusions elicited during this assessment. Denies suicidal ideations. Denies homicidal ideations. Per nursing staff, the patient has not displayed any aggressive behavior since arrival in the ED.    [name] [name] reports passive suicidal ideation since the death of his mother 4 months ago.  He states "the thought runs through my head that I wish God would take me away."  Patient denies current suicidal ideations. Reports he has so much to live for he would not hurt himself. Patient has a 62-year-old daughter, a 61-year-old daughter and a 2-month-old daughter.  Patient reports  that he was unable to attend his mother's funeral.  He denies suicidal and homicidal ideations currently.  He denies history of suicide attempts, denies history of nonsuicidal self-harm behavior.   Past Psychiatric History: Patient denies personal mental health history.  No current medications to address mood.  He denies history of inpatient psychiatric hospitalization.  Family mental health history include includes patient's maternal uncle who "had hallucinations."   Past Medical History:  Past Medical History:  Diagnosis Date   Diabetes mellitus without complication ([place])    Family History: No family history on file.  Social History:  Social History   Substance and Sexual Activity  Alcohol Use None     Social History   Substance and Sexual Activity  Drug Use Not on file    Social History   Socioeconomic History   Marital status: Single    Spouse name: Not on file   Number of children: Not on file   Years of education: Not on file   Highest education level: Not on file  Occupational History   Not on file  Tobacco Use   Smoking status: Not on file   Smokeless tobacco: Not on file  Substance and Sexual Activity   Alcohol use: Not on file   Drug use: Not on file   Sexual activity: Not on file  Other Topics Concern   Not on file  Social History Narrative   Not on file   Social Determinants of Health   Financial Resource Strain: Not on file  Food Insecurity: Not on file  Transportation Needs:  Not on file  Physical Activity: Not on file  Stress: Not on file  Social Connections: Not on file    Tobacco Cessation:  A prescription for an FDA-approved tobacco cessation medication was offered at discharge and the patient refused  Current Medications: Current Facility-Administered Medications  Medication Dose Route Frequency Provider Last Rate Last Admin   acetaminophen (TYLENOL) tablet 650 mg  650 mg Oral Q4H PRN Petrucelli, Samantha R, PA-C       amLODipine  (NORVASC) tablet 10 mg  10 mg Oral Daily Petrucelli, Samantha R, PA-C   10 mg at 04/07/22 0951   insulin aspart (novoLOG) injection 0-15 Units  0-15 Units Subcutaneous TID WC Curatolo, Adam, DO   11 Units at 04/07/22 0853   insulin aspart (novoLOG) injection 0-5 Units  0-5 Units Subcutaneous QHS Curatolo, Adam, DO       nicotine (NICODERM CQ - dosed in mg/24 hours) patch 21 mg  21 mg Transdermal Daily Petrucelli, Samantha R, PA-C   21 mg at 04/07/22 [id number]   Current Outpatient Medications  Medication Sig Dispense Refill   insulin NPH-regular Human (70-30) 100 UNIT/ML injection Inject 15 Units into the skin in the morning, at noon, and at bedtime.     PTA Medications: (Not in a hospital admission)   [place] Scale:  [place] [facility] from 04/05/2022 in [place] DEPT  C-SSRS RISK CATEGORY No Risk       Musculoskeletal: Strength & Muscle Tone: within normal limits Gait & Station: normal Patient leans: N/A  Psychiatric Specialty Exam: Presentation  General Appearance: Casual; Neat  Eye Contact:Good  Speech:Clear and Coherent; Normal Rate  Speech Volume:Normal  Handedness:No data recorded  Mood and Affect  Mood:Euthymic  Affect:Congruent   Thought Process  Thought Processes:Coherent; Goal Directed; Linear  Descriptions of Associations:Intact  Orientation:Full (Time, Place and Person)  Thought Content:Logical  History of Schizophrenia/Schizoaffective disorder:No data recorded Duration of Psychotic Symptoms:No data recorded Hallucinations:Hallucinations: None  Ideas of Reference:None  Suicidal Thoughts:Suicidal Thoughts: No  Homicidal Thoughts:Homicidal Thoughts: No   Sensorium  Memory:Immediate Good; Recent Good; Remote Good  Judgment:Fair  Insight:Fair   Executive Functions  Concentration:Good  Attention Span:Good  [place] of Knowledge:Good  Language:Good   Psychomotor Activity  Psychomotor  Activity:Psychomotor Activity: Normal   Assets  Assets:Communication Skills; Desire for Improvement   Sleep  Sleep:Sleep: Good    Physical Exam: Physical Exam ROS Blood pressure (!) 154/99, pulse 92, temperature 98.7 F (37.1 C), temperature source Oral, resp. rate 18, SpO2 96 %. There is no height or weight on file to calculate BMI.   Demographic Factors:  Male, Low socioeconomic status, and Unemployed  Loss Factors: Legal issues and Financial problems/change in socioeconomic status  Historical Factors: Family history of mental illness or substance abuse  Risk Reduction Factors:   Responsible for children under 34 years of age, Sense of responsibility to family, Religious beliefs about death, Living with another person, especially a relative, and Positive social support  Continued Clinical Symptoms:  Alcohol/Substance Abuse/Dependencies  Cognitive Features That Contribute To Risk:  None    Suicide Risk:  Mild:  Suicidal ideation of limited frequency, intensity, duration, and specificity.  There are no identifiable plans, no associated intent, mild dysphoria and related symptoms, good self-control (both objective and subjective assessment), few other risk factors, and identifiable protective factors, including available and accessible social support.   [place] Follow up.   Specialty: Urgent Care Why:  As needed, If symptoms worsen  Behavioral Health Urgent Care is open 24 hours/7 days a week for urgent mental health care needs Contact information: [address] [place] [address] [phone]                Medical Decision Making: [name] [name] is a 36 y.o. male who presented under involuntary commitment, to [facility] on 04/05/22. Patient petitioned by [employer] who is jail staff. Petition reads: "Respondent was released from jail, stood outside the jail door and was trying to break  back into the jail.  He stated people were trying to kill him as he was the only one standing outside at the time.  He had a blank stare up on his face as if he was in another world." Patient was transferred to [facility] for medical clearance.   At time of discharge, patient denies SI, HI, AVH and is able to contract for safety. He demonstrated no overt evidence of psychosis or mania. Prior to discharge the patient verbalized that he understood warning signs, triggers, and symptoms of worsening mental health and how to access emergency mental health care if they felt it was needed. Patient was instructed to call 911 or return to the emergency room if they experienced any concerning symptoms after discharge. Patient voiced understanding and agreed to this.   Problem 1: Substance induced mood disorder.   Disposition: No evidence of imminent risk to self or others at present.   Patient does not meet criteria for psychiatric inpatient admission. Supportive therapy provided about ongoing stressors. Discussed crisis plan, support from social network, calling 911, coming to the Emergency Department, and calling Suicide Hotline.     Discharge Instructions       Discharge recommendations:  Patient is to take medications as prescribed. Please see information for follow-up appointment with psychiatry and therapy. Please follow up with your primary care provider for all medical related needs.   Therapy: We recommend that patient participate in individual therapy to address mental health concerns.  Safety:  The patient should abstain from use of illicit substances/drugs and abuse of any medications. If symptoms worsen or do not continue to improve or if the patient becomes actively suicidal or homicidal then it is recommended that the patient return to the closest hospital emergency department, the [facility], or call 911 for further evaluation and treatment. National  Suicide Prevention Lifeline 1-800-SUICIDE or (651) 716-6060.  About 988 988 offers 24/7 access to trained crisis counselors who can help people experiencing mental health-related distress. People can call or text 988 or chat 988lifeline.org for themselves or if they are worried about a loved one who may need crisis support.  Crisis Mobile: [facility]:                     660-641-1715 (for crisis response 24 hours a day) [facility] Hotline:                                            8503736119   ____________________________________________________ Substance Abuse Treatment Programs   Intensive Outpatient Programs [facility]                                   [address]  [place], [place]                                                                         [phone]                                                     [facility] [address] [place], [place] [phone]   [facility]                             (Inpatient and outpatient)                                             [address]                                                                                                                [phone]                 [facility] [phone] (Suboxone and Methadone)   [address]                                                           [place], [place] [address]                                       [phone]                                                     [address] [place], [place] [phone]   [facility] (Outpatient/Inpatient, [facility])                    (insurance only) [phone]  [facility] (Groups &  Residential) [place], [place]                            [facility]                                        [address]                                         [place], [place]                                                     [facility] (for caregivers and family) [phone] [address] Kristeen Mans. [place], [place] [facility]   [facility]: Mobile Crisis Management 24 hours:  [phone]   [facility] [employer] sliding scale fee and walk in schedule: M-F 8am-12pm/1pm-3pm [place], [place] [address] [address] [place], [place] [address] [phone]   [facility] ([facility])- new patient walk-in appointments available Monday - Friday 8am -3pm.          [address] [place], [place] [address] [phone] or crisis line- [facility] [facility]/ Intensive Outpatient Therapy Program [place], [place] [address] [facility] [place]                                                             [phone]                                      [place], [place] [address]  [facility] [phone]. [place], [place] [address]     [employer] [facility]                                                                                                             [address] Johnette Abraham  [place], [place] [address]                                                           [phone]   [place], [place], [place] [address] [phone]   [facility]                                   [address] [place], [place] [address]                                               [place], [place] Joycelyn Man                                                [place] [address]                                                [phone]                                         [facility] [place] [address]   [facility]  [address], [place], [place], [place] [address] [place]                                               [address]                                                [place], [place] [address]                                               [phone]                                                     [facility] [address] [place], [place] [address] [phone] Resources for Temporary Residential Assistance/Crisis [facility]) M-F 8am-3pm   [address],  [address]   [phone] Services include: laundry, barbering, support groups, case management, phone  & computer access, showers, AA/NA mtgs, mental health/substance abuse nurse, job skills class, disability information, VA assistance, spiritual classes, etc.          Rozetta Nunnery, NP 04/07/2022, 10:42 AM

## 2022-04-07 NOTE — ED Provider Notes (Signed)
Emergency Medicine Observation Re-evaluation Note  Cash Duce is a 36 y.o. male, seen on rounds today.  Pt initially presented to the ED for complaints of IVC Currently, the patient is resting.  Physical Exam  BP (!) 154/99 (BP Location: Right Arm)   Pulse 92   Temp 98.7 F (37.1 C) (Oral)   Resp 18   SpO2 96%  Physical Exam General: laying down, no acute distress Lungs: normal effort Psych: no suicidal thoughts  ED Course / MDM  EKG:EKG Interpretation  Date/Time:  Friday April 05 2022 20:16:01 EDT Ventricular Rate:  108 PR Interval:  126 QRS Duration: 106 QT Interval:  358 QTC Calculation: 479 R Axis:   -26 Text Interpretation: Sinus tachycardia Incomplete right bundle branch block T wave abnormality, consider inferolateral ischemia Abnormal ECG No previous ECGs available Confirmed by Vivien Rossetti ([id number]) on 04/05/2022 8:26:56 PM  I have reviewed the labs performed to date as well as medications administered while in observation.  Recent changes in the last 24 hours include insulin, BP meds.  Plan  Current plan is for discharge. Cleared by psychiatry and they have rescinded his IVC.    Pricilla Loveless, MD 04/07/22 1106

## 2022-04-09 ENCOUNTER — Emergency Department (HOSPITAL_COMMUNITY)
Admission: EM | Admit: 2022-04-09 | Discharge: 2022-04-09 | Disposition: A | Discharge status: Home / Self Care | Payer: Self-pay | Attending: Emergency Medicine | Admitting: Emergency Medicine

## 2022-04-09 ENCOUNTER — Other Ambulatory Visit: Payer: Self-pay

## 2022-04-09 DIAGNOSIS — F17210 Nicotine dependence, cigarettes, uncomplicated: Secondary | ICD-10-CM | POA: Insufficient documentation

## 2022-04-09 DIAGNOSIS — R110 Nausea: Secondary | ICD-10-CM | POA: Insufficient documentation

## 2022-04-09 DIAGNOSIS — Y900 Blood alcohol level of less than 20 mg/100 ml: Secondary | ICD-10-CM | POA: Insufficient documentation

## 2022-04-09 DIAGNOSIS — F1010 Alcohol abuse, uncomplicated: Secondary | ICD-10-CM | POA: Insufficient documentation

## 2022-04-09 DIAGNOSIS — R630 Anorexia: Secondary | ICD-10-CM | POA: Insufficient documentation

## 2022-04-09 LAB — CBC WITH DIFFERENTIAL/PLATELET
Abs Immature Granulocytes: 0.01 10*3/uL (ref 0.00–0.07)
Basophils Absolute: 0 10*3/uL (ref 0.0–0.1)
Basophils Relative: 1 %
Eosinophils Absolute: 0.1 10*3/uL (ref 0.0–0.5)
Eosinophils Relative: 2 %
HCT: 44.6 % (ref 39.0–52.0)
Hemoglobin: 15.4 g/dL (ref 13.0–17.0)
Immature Granulocytes: 0 %
Lymphocytes Relative: 50 %
Lymphs Abs: 2.5 10*3/uL (ref 0.7–4.0)
MCH: 28.6 pg (ref 26.0–34.0)
MCHC: 34.5 g/dL (ref 30.0–36.0)
MCV: 82.9 fL (ref 80.0–100.0)
Monocytes Absolute: 0.4 10*3/uL (ref 0.1–1.0)
Monocytes Relative: 7 %
Neutro Abs: 2 10*3/uL (ref 1.7–7.7)
Neutrophils Relative %: 40 %
Platelets: 231 10*3/uL (ref 150–400)
RBC: 5.38 MIL/uL (ref 4.22–5.81)
RDW: 12.9 % (ref 11.5–15.5)
WBC: 5 10*3/uL (ref 4.0–10.5)
nRBC: 0 % (ref 0.0–0.2)

## 2022-04-09 LAB — COMPREHENSIVE METABOLIC PANEL
ALT: 11 U/L (ref 0–44)
AST: 19 U/L (ref 15–41)
Albumin: 3.4 g/dL — ABNORMAL LOW (ref 3.5–5.0)
Alkaline Phosphatase: 58 U/L (ref 38–126)
Anion gap: 7 (ref 5–15)
BUN: 6 mg/dL (ref 6–20)
CO2: 28 mmol/L (ref 22–32)
Calcium: 9.1 mg/dL (ref 8.9–10.3)
Chloride: 103 mmol/L (ref 98–111)
Creatinine, Ser: 1.02 mg/dL (ref 0.61–1.24)
GFR, Estimated: >60 mL/min (ref >60)
Glucose, Bld: 138 mg/dL — ABNORMAL HIGH (ref 70–99)
Potassium: 3.7 mmol/L (ref 3.5–5.1)
Sodium: 138 mmol/L (ref 135–145)
Total Bilirubin: 0.3 mg/dL (ref 0.3–1.2)
Total Protein: 6.4 g/dL — ABNORMAL LOW (ref 6.5–8.1)

## 2022-04-09 LAB — ETHANOL: Alcohol, Ethyl (B): <10 mg/dL (ref <10)

## 2022-04-09 MED ORDER — CHLORDIAZEPOXIDE HCL 25 MG PO CAPS
25.0000 mg | ORAL_CAPSULE | Freq: Once | ORAL | Status: AC
  Administered 2022-04-09: 25 mg via ORAL
  Filled 2022-04-09: qty 1

## 2022-04-09 MED ORDER — THIAMINE HCL 100 MG PO TABS
100.0000 mg | ORAL_TABLET | Freq: Once | ORAL | Status: AC
  Administered 2022-04-09: 100 mg via ORAL
  Filled 2022-04-09: qty 1

## 2022-04-09 MED ORDER — CHLORDIAZEPOXIDE HCL 25 MG PO CAPS: ORAL_CAPSULE | ORAL | 0 refills | Status: DC

## 2022-04-09 MED ORDER — SODIUM CHLORIDE 0.9 % IV BOLUS
1000.0000 mL | Freq: Once | INTRAVENOUS | Status: AC
  Administered 2022-04-09: 1000 mL via INTRAVENOUS

## 2022-04-09 MED ORDER — FOLIC ACID 1 MG PO TABS
1.0000 mg | ORAL_TABLET | Freq: Once | ORAL | Status: AC
Start: 2022-04-09 — End: 2022-04-09
  Administered 2022-04-09: 1 mg via ORAL
  Filled 2022-04-09: qty 1

## 2022-04-09 NOTE — ED Provider Notes (Signed)
[name] EMERGENCY DEPARTMENT Provider Note   CSN: [id number] Arrival date & time: 04/09/22  0945     History {Add pertinent medical, surgical, social history, OB history to HPI:1} Chief Complaint  Patient presents with   alcohol detox   Alcohol Problem    [name] [name] is a 36 y.o. male.  HPI Patient presents with concern of shakiness and unsteadiness in the context of ongoing alcohol use.  Last alcohol intake was yesterday about 24 hours ago.  He states that he drinks regularly, up to half a gallon daily.  He has been drinking for about 6 months after relapsing following a period of sobriety.  No focal pain, he notes generalized shakiness, unsteadiness, with nausea, anorexia, but no vomiting, no seizures, no fall.  He smokes when he drinks, but denies drug use.    Home Medications Prior to Admission medications   Medication Sig Start Date End Date Taking? Authorizing Provider  acetaminophen (TYLENOL) 500 MG tablet Take 1,000 mg by mouth every 6 (six) hours as needed for mild pain.    [provider]  amLODipine (NORVASC) 10 MG tablet Take 1 tablet (10 mg total) by mouth daily. 01/23/22   Margarita Grizzle, MD  Insulin Syringe-Needle U-100 [place] INSULIN SYRINGE) 30G X 5/16" 0.3 ML MISC Use 3 (three) times daily with insulin 01/23/22   Margarita Grizzle, MD  lisinopril (ZESTRIL) 10 MG tablet Take 1 tablet (10 mg total) by mouth daily. 01/23/22   Margarita Grizzle, MD  insulin NPH-regular Human (70-30) 100 UNIT/ML injection Inject 15 Units into the skin 3 (three) times daily. 01/23/22   Margarita Grizzle, MD      Allergies    Patient has no known allergies.    Review of Systems   Review of Systems  All other systems reviewed and are negative.   Physical Exam Updated Vital Signs BP (!) 127/100   Pulse 76   Temp 98.4 F (36.9 C) (Oral)   Resp 18   Ht 5\' 11"  (1.803 m)   Wt 99.8 kg   SpO2 100%   BMI 30.68 kg/m  Physical Exam Vitals and nursing note  reviewed.  Constitutional:      General: He is not in acute distress.    Appearance: He is well-developed.  HENT:     Head: Normocephalic and atraumatic.  Eyes:     Conjunctiva/sclera: Conjunctivae normal.  Cardiovascular:     Rate and Rhythm: Normal rate and regular rhythm.  Pulmonary:     Effort: Pulmonary effort is normal. No respiratory distress.     Breath sounds: No stridor.  Abdominal:     General: There is no distension.  Skin:    General: Skin is warm and dry.  Neurological:     Mental Status: He is alert and oriented to person, place, and time.  Psychiatric:     Comments: Mild shakiness, no true tremor.  No suicidal ideation to his condition stating that he wants to stop drinking due to presence of a newborn, 29-month-old in his life.     ED Results / Procedures / Treatments   Labs (all labs ordered are listed, but only abnormal results are displayed) Labs Reviewed - No data to display  EKG None  Radiology No results found.  Procedures Procedures  {Document cardiac monitor, telemetry assessment procedure when appropriate:1}  Medications Ordered in ED Medications  sodium chloride 0.9 % bolus 1,000 mL (has no administration in time range)  chlordiazePOXIDE (LIBRIUM) capsule 25 mg (  has no administration in time range)  folic acid (FOLVITE) tablet 1 mg (has no administration in time range)  thiamine (VITAMIN B1) tablet 100 mg (has no administration in time range)    ED Course/ Medical Decision Making/ A&P                           Medical Decision Making Adult male with chronic alcohol use presents with shakiness, concern for withdrawal versus acute intoxication.  Denial of pain, unremarkable vital signs are somewhat reassuring initially.  Patient required fluids, labs, benzodiazepine.  Amount and/or Complexity of Data Reviewed Labs: ordered. Decision-making details documented in ED Course.  Risk OTC drugs. Prescription drug  management.   ***  {Document critical care time when appropriate:1} {Document review of labs and clinical decision tools ie heart score, Chads2Vasc2 etc:1}  {Document your independent review of radiology images, and any outside records:1} {Document your discussion with family members, caretakers, and with consultants:1} {Document social determinants of health affecting pt's care:1} {Document your decision making why or why not admission, treatments were needed:1} Final Clinical Impression(s) / ED Diagnoses Final diagnoses:  None    Rx / DC Orders ED Discharge Orders     None

## 2022-04-09 NOTE — ED Provider Notes (Signed)
  Physical Exam  BP (!) 144/98   Pulse 83   Temp 98.4 F (36.9 C) (Oral)   Resp 18   Ht 5\' 11"  (1.803 m)   Wt 99.8 kg   SpO2 98%   BMI 30.68 kg/m   Physical Exam Vitals and nursing note reviewed.  Neurological:     Mental Status: He is alert.     Procedures  Procedures  ED Course / MDM    Medical Decision Making Amount and/or Complexity of Data Reviewed Labs: ordered.  Risk OTC drugs. Prescription drug management.   [place]ytes and d/c with librium  Lab work reviewed reviewed and reassuring.  Does not appear to be an acute risk to himself and will treat with Librium.  Will discharge with resources.       , MD 04/09/22 [phone]

## 2022-04-09 NOTE — ED Triage Notes (Signed)
Pt. Stated, Im trying to stop drinking alcohol. I usually drink from a pint to gallon every weekend.

## 2022-04-09 NOTE — ED Provider Triage Note (Signed)
Emergency Medicine Provider Triage Evaluation Note  [name] [name] , a 36 y.o. male  was evaluated in triage.  Pt complains of seeking detox from alcohol. Patient is a poor historian. He reports he drinks a pint every other day and drinks half a gallon on the weekends. Looking for treatment. Reports he has never detoxed or withdrawn from alcohol before.   Review of Systems  Positive:  Negative:   Physical Exam  BP 131/86 (BP Location: Right Arm)   Pulse (!) 102   Temp 98.4 F (36.9 C) (Oral)   Resp 18   Ht 5\' 11"  (1.803 m)   Wt 99.8 kg   SpO2 100%   BMI 30.68 kg/m  Gen:   Awake, no distress   Resp:  Normal effort  MSK:   Moves extremities without difficulty  Other:    Medical Decision Making  Medically screening exam initiated at 10:37 AM.  Appropriate orders placed.  [name] [name] was informed that the remainder of the evaluation will be completed by another provider, this initial triage assessment does not replace that evaluation, and the importance of remaining in the ED until their evaluation is complete.    Barbette Or, PA-C 04/09/22 1038

## 2022-04-09 NOTE — ED Notes (Signed)
Pt endorses drinking half a gallon a day of liquor with the last drink was 24 hours ago. Pt has had a seizure from withdrawal approximately 5 years ago.

## 2022-04-09 NOTE — Discharge Instructions (Addendum)
As discussed, with your history of alcohol abuse it is important that you pursue outpatient therapy.  Please use the provided resources to facilitate this.  You are starting a medication that should assist in your efforts.  Take all medication as directed.  Return here for concerning changes in your condition.

## 2022-04-11 ENCOUNTER — Encounter (HOSPITAL_COMMUNITY): Payer: Self-pay

## 2022-04-14 IMAGING — CR DG FOOT COMPLETE 3+V*R*
3 series · 3 of 3 positions shown · non-contrast
Comparison: None.

CLINICAL DATA: pain

EXAM:
RIGHT FOOT COMPLETE - 3+ VIEW

[x foot ap right]
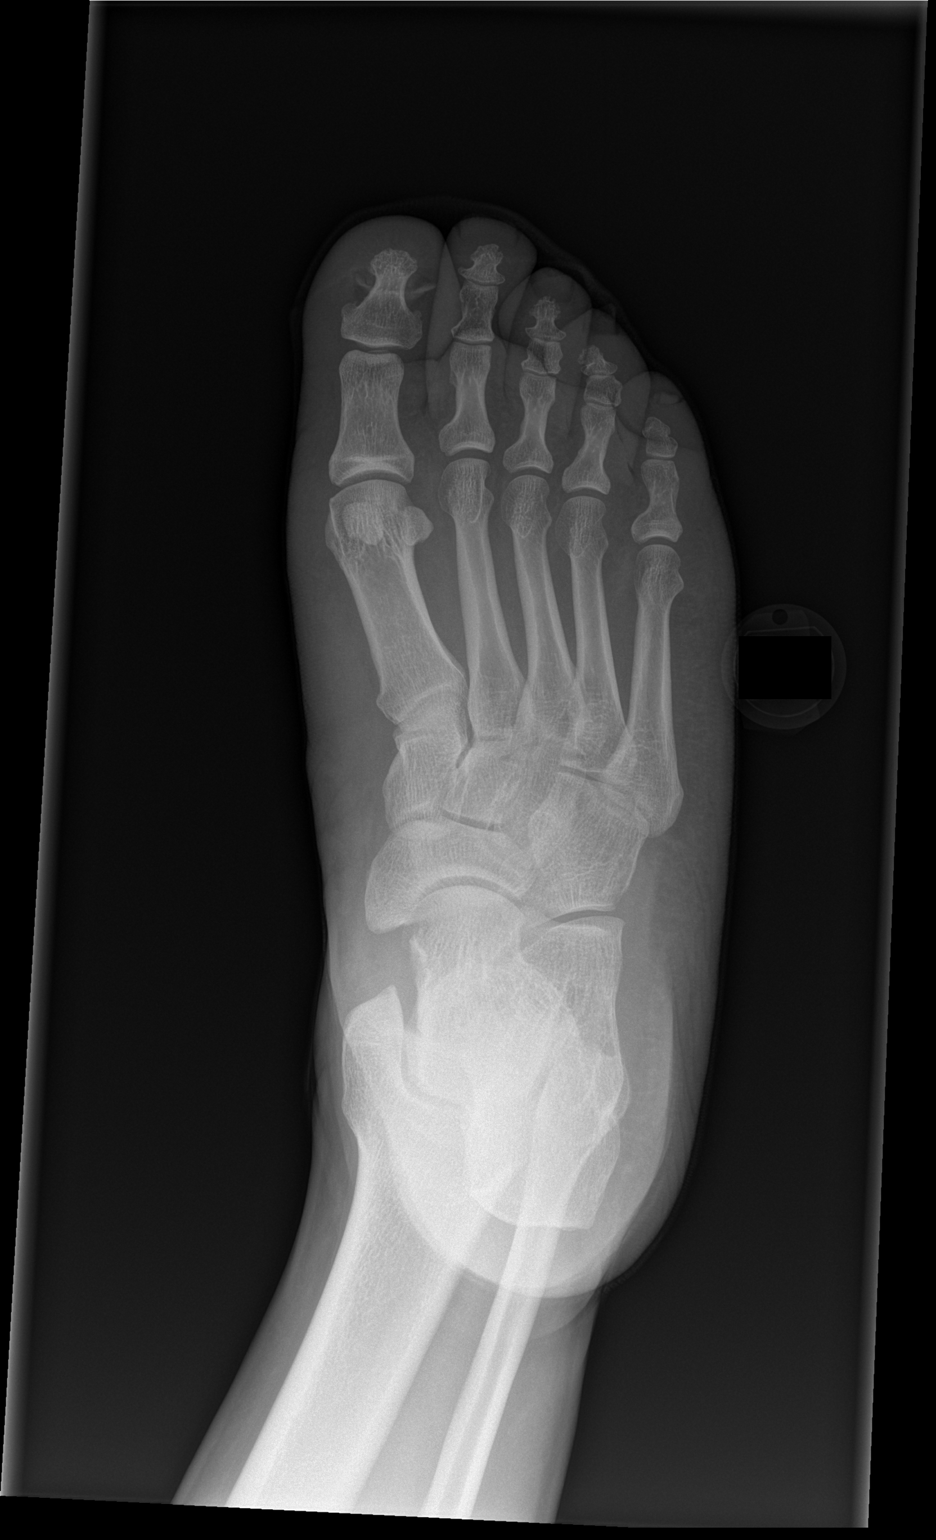

[x foot obl right]
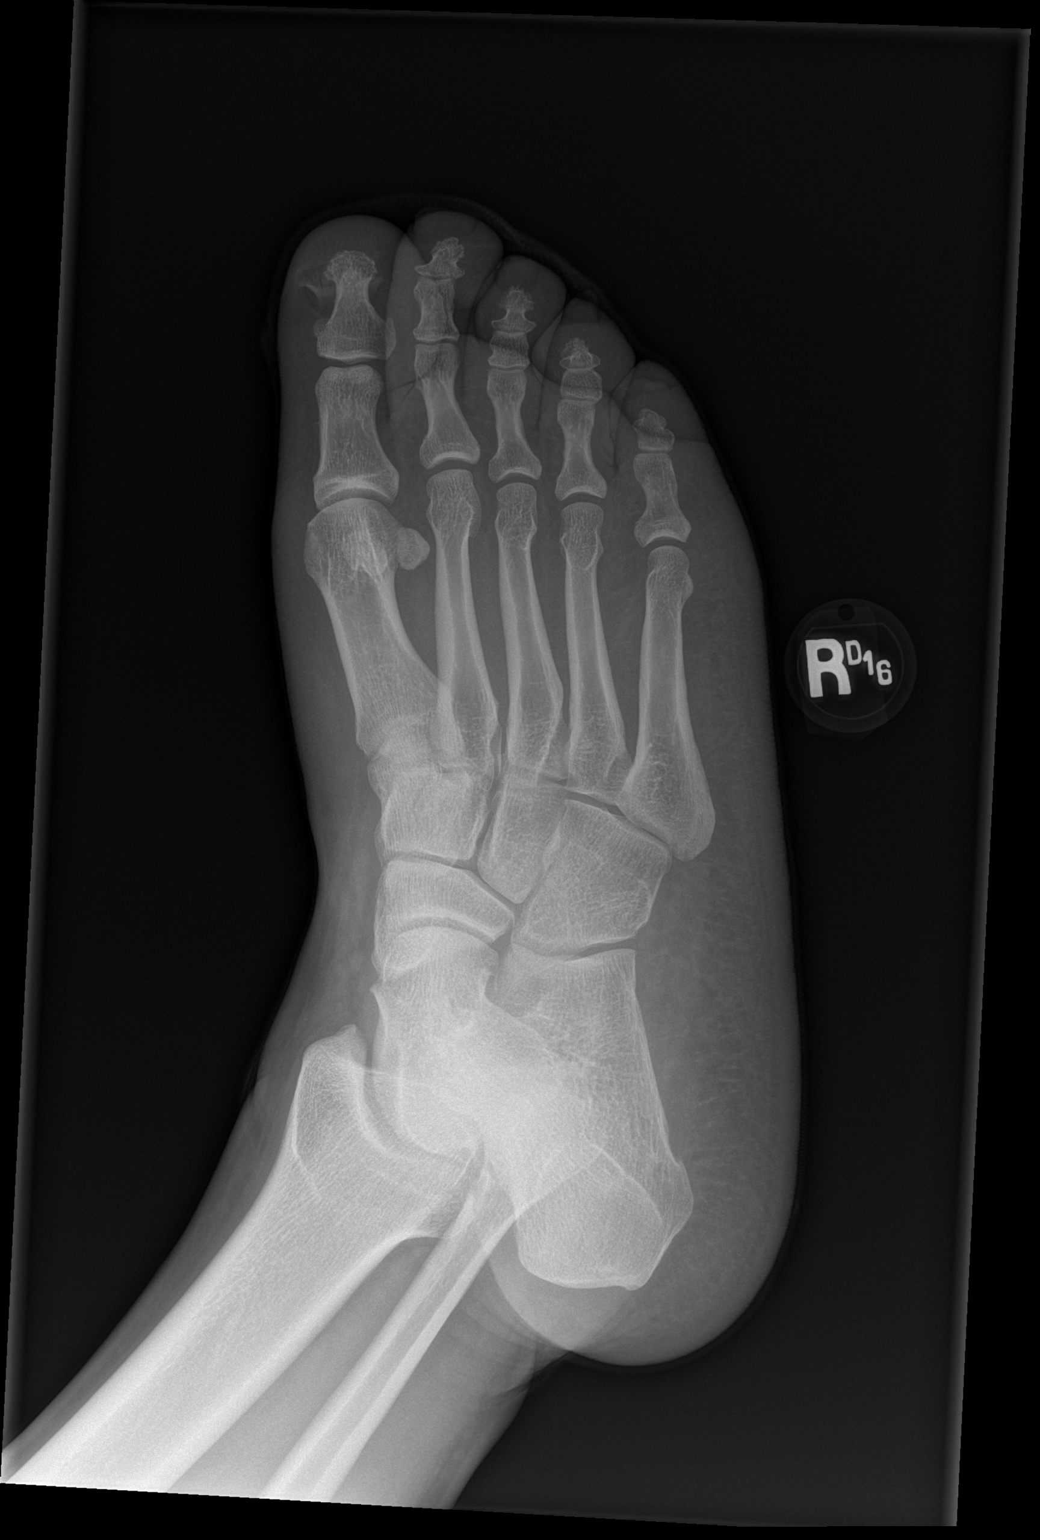

[x foot lat right]
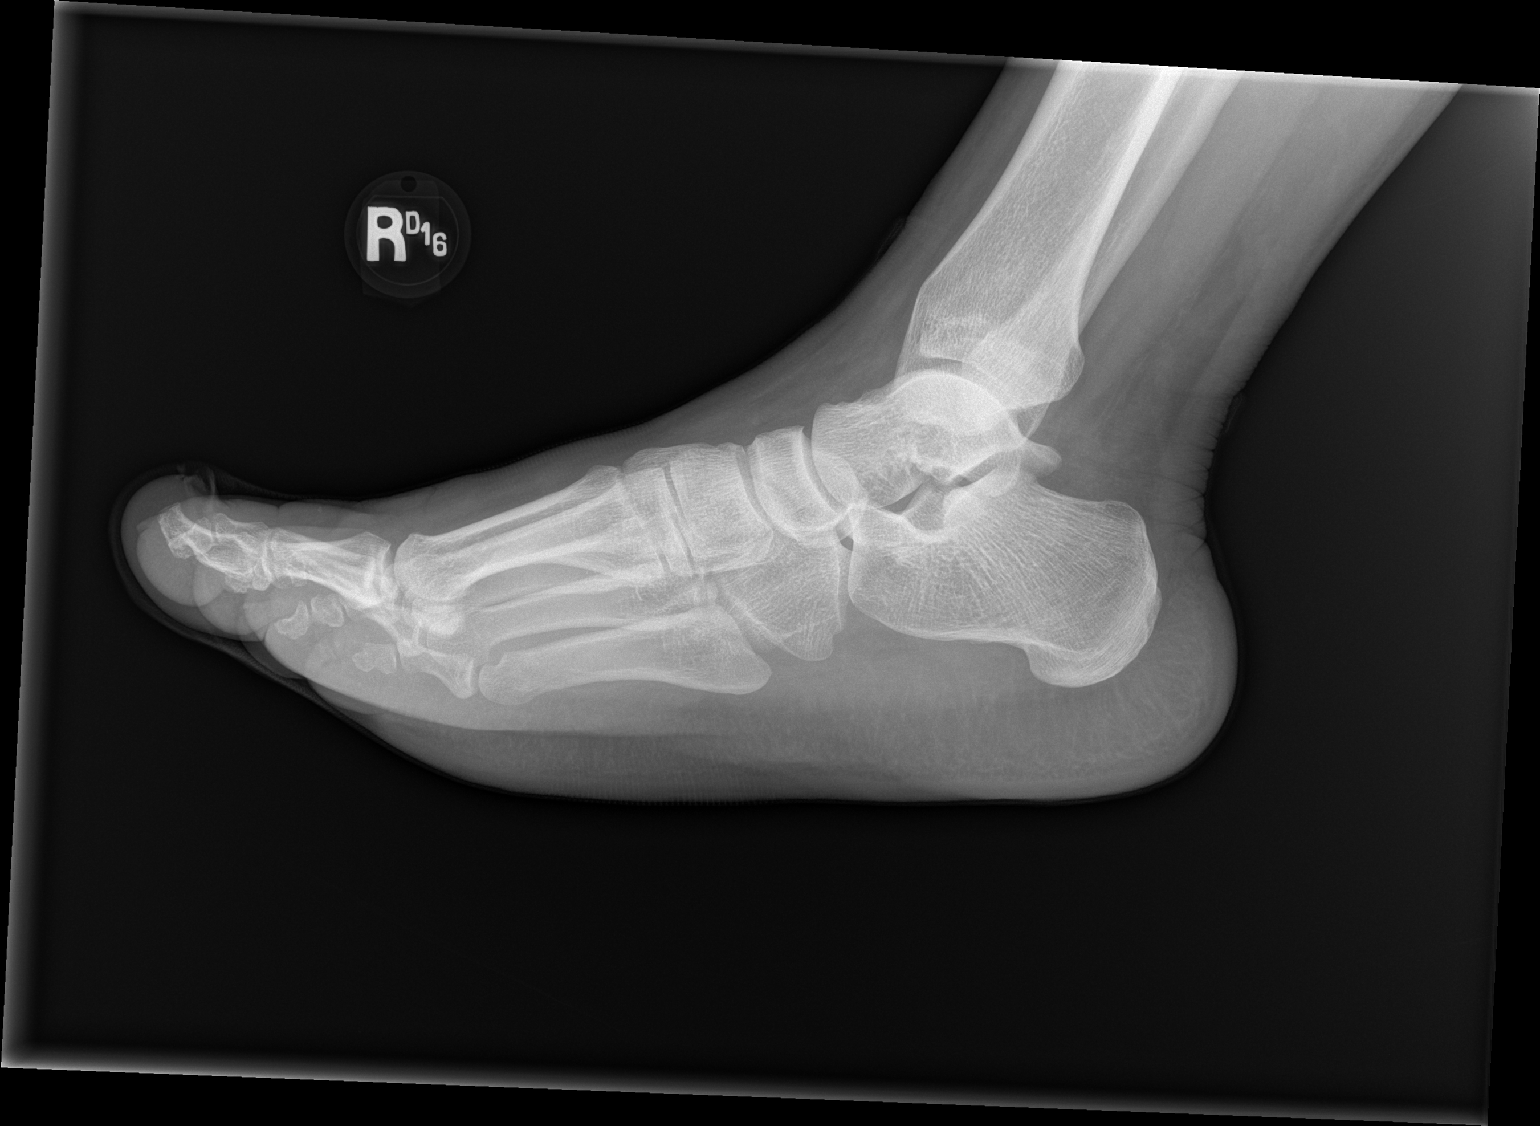

[3 of 3 positions shown; findings below may reference images not displayed]

FINDINGS: There is no evidence of fracture or dislocation. There is no
evidence of arthropathy or other focal bone abnormality. Soft
tissues are unremarkable. No retained radiopaque foreign body.
IMPRESSION: Negative.

## 2022-04-30 ENCOUNTER — Encounter (HOSPITAL_COMMUNITY): Payer: Self-pay | Admitting: Emergency Medicine

## 2022-04-30 ENCOUNTER — Other Ambulatory Visit: Payer: Self-pay

## 2022-04-30 ENCOUNTER — Emergency Department (HOSPITAL_COMMUNITY)
Admission: EM | Admit: 2022-04-30 | Discharge: 2022-04-30 | Disposition: A | Discharge status: Home / Self Care | Payer: Self-pay | Attending: Emergency Medicine | Admitting: Emergency Medicine

## 2022-04-30 ENCOUNTER — Emergency Department (HOSPITAL_COMMUNITY): Payer: Self-pay

## 2022-04-30 DIAGNOSIS — E1165 Type 2 diabetes mellitus with hyperglycemia: Secondary | ICD-10-CM | POA: Insufficient documentation

## 2022-04-30 DIAGNOSIS — Z794 Long term (current) use of insulin: Secondary | ICD-10-CM | POA: Insufficient documentation

## 2022-04-30 DIAGNOSIS — Z760 Encounter for issue of repeat prescription: Secondary | ICD-10-CM

## 2022-04-30 DIAGNOSIS — R739 Hyperglycemia, unspecified: Secondary | ICD-10-CM

## 2022-04-30 DIAGNOSIS — R079 Chest pain, unspecified: Secondary | ICD-10-CM | POA: Insufficient documentation

## 2022-04-30 LAB — CBC WITH DIFFERENTIAL/PLATELET
Abs Immature Granulocytes: 0 10*3/uL (ref 0.00–0.07)
Basophils Absolute: 0 10*3/uL (ref 0.0–0.1)
Basophils Relative: 1 %
Eosinophils Absolute: 0.1 10*3/uL (ref 0.0–0.5)
Eosinophils Relative: 2 %
HCT: 49.5 % (ref 39.0–52.0)
Hemoglobin: 16.5 g/dL (ref 13.0–17.0)
Immature Granulocytes: 0 %
Lymphocytes Relative: 34 %
Lymphs Abs: 1.4 10*3/uL (ref 0.7–4.0)
MCH: 28.4 pg (ref 26.0–34.0)
MCHC: 33.3 g/dL (ref 30.0–36.0)
MCV: 85.3 fL (ref 80.0–100.0)
Monocytes Absolute: 0.3 10*3/uL (ref 0.1–1.0)
Monocytes Relative: 8 %
Neutro Abs: 2.3 10*3/uL (ref 1.7–7.7)
Neutrophils Relative %: 55 %
Platelets: 205 10*3/uL (ref 150–400)
RBC: 5.8 MIL/uL (ref 4.22–5.81)
RDW: 13.2 % (ref 11.5–15.5)
WBC: 4.1 10*3/uL (ref 4.0–10.5)
nRBC: 0 % (ref 0.0–0.2)

## 2022-04-30 LAB — BASIC METABOLIC PANEL
Anion gap: 11 (ref 5–15)
BUN: 13 mg/dL (ref 6–20)
CO2: 26 mmol/L (ref 22–32)
Calcium: 9.1 mg/dL (ref 8.9–10.3)
Chloride: 97 mmol/L — ABNORMAL LOW (ref 98–111)
Creatinine, Ser: 1.04 mg/dL (ref 0.61–1.24)
GFR, Estimated: >60 mL/min (ref >60)
Glucose, Bld: 371 mg/dL — ABNORMAL HIGH (ref 70–99)
Potassium: 4.4 mmol/L (ref 3.5–5.1)
Sodium: 134 mmol/L — ABNORMAL LOW (ref 135–145)

## 2022-04-30 LAB — URINALYSIS, ROUTINE W REFLEX MICROSCOPIC
Bacteria, UA: NONE SEEN
Bilirubin Urine: NEGATIVE
Glucose, UA: >=500 mg/dL — AB
Ketones, ur: 20 mg/dL — AB
Leukocytes,Ua: NEGATIVE
Nitrite: NEGATIVE
Protein, ur: NEGATIVE mg/dL
Specific Gravity, Urine: 1.033 — ABNORMAL HIGH (ref 1.005–1.030)
pH: 5 (ref 5.0–8.0)

## 2022-04-30 LAB — CBG MONITORING, ED
Glucose-Capillary: 365 mg/dL — ABNORMAL HIGH (ref 70–99)
Glucose-Capillary: 240 mg/dL — ABNORMAL HIGH (ref 70–99)
Glucose-Capillary: 375 mg/dL — ABNORMAL HIGH (ref 70–99)
Glucose-Capillary: 337 mg/dL — ABNORMAL HIGH (ref 70–99)

## 2022-04-30 LAB — D-DIMER, QUANTITATIVE: D-Dimer, Quant: 0.28 ug/mL-FEU (ref 0.00–0.50)

## 2022-04-30 LAB — TROPONIN I (HIGH SENSITIVITY): Troponin I (High Sensitivity): 4 ng/L (ref <18)

## 2022-04-30 MED ORDER — INSULIN GLARGINE 100 UNIT/ML ~~LOC~~ SOLN: 50.0000 [IU] | Freq: Every day | SUBCUTANEOUS | 0 refills | Status: DC

## 2022-04-30 MED ORDER — INSULIN ASPART 100 UNIT/ML IJ SOLN: 15.0000 [IU] | Freq: Three times a day (TID) | INTRAMUSCULAR | 0 refills | Status: DC

## 2022-04-30 MED ORDER — "INSULIN SYRINGE-NEEDLE U-100 30G X 5/16"" 0.3 ML MISC": Freq: Three times a day (TID) | 0 refills | Status: DC

## 2022-04-30 MED ORDER — ASPIRIN 81 MG PO CHEW
324.0000 mg | CHEWABLE_TABLET | Freq: Once | ORAL | Status: AC
  Administered 2022-04-30: 324 mg via ORAL
  Filled 2022-04-30: qty 4

## 2022-04-30 MED ORDER — LISINOPRIL 10 MG PO TABS: 10.0000 mg | ORAL_TABLET | Freq: Every day | ORAL | 0 refills | Status: DC

## 2022-04-30 MED ORDER — INSULIN ASPART 100 UNIT/ML IJ SOLN
10.0000 [IU] | Freq: Once | INTRAMUSCULAR | Status: AC
  Administered 2022-04-30: 10 [IU] via SUBCUTANEOUS
  Filled 2022-04-30: qty 1

## 2022-04-30 MED ORDER — INSULIN ASPART 100 UNIT/ML IJ SOLN
8.0000 [IU] | Freq: Once | INTRAMUSCULAR | Status: AC
  Administered 2022-04-30: 8 [IU] via SUBCUTANEOUS
  Filled 2022-04-30: qty 1

## 2022-04-30 MED ORDER — AMLODIPINE BESYLATE 10 MG PO TABS: 10.0000 mg | ORAL_TABLET | Freq: Every day | ORAL | 0 refills | Status: DC

## 2022-04-30 MED ORDER — SODIUM CHLORIDE 0.9 % IV BOLUS
1000.0000 mL | Freq: Once | INTRAVENOUS | Status: AC
  Administered 2022-04-30: 1000 mL via INTRAVENOUS

## 2022-04-30 NOTE — ED Provider Notes (Signed)
[facility] EMERGENCY DEPARTMENT Provider Note   CSN: [id number] Arrival date & time: 04/30/22  6295     History  Chief Complaint  Patient presents with   Hyperglycemia    [name] [name] is a 36 y.o. male with a history of insulin-dependent diabetes, also history of substance abuse including cocaine but patient denies use in greater than 2 weeks presenting for evaluation of hyperglycemia and chest pain.  He is currently taking 70/30 insulin, 35 units with each meal, last dose taken about 830 this morning but states his blood glucose levels continue to elevate.  His last check this morning was 200.  He endorses increased thirst and increased urination.  He denies abdominal pain, nausea or vomiting.  He does however endorse chest pain which also started this morning.  He woke with midsternal sharp pain which is constant but worsens with deep inspiration.  He denies chest injury, he has been coughing, nonproductive cough.  He does have some mild shortness of breath.  He has found no alleviators for this symptom.  No peripheral edema, unilateral leg swelling.  Patient states he used to be on NovoLog and Lantus prior to being incarcerated.  Since being back in the community he has been unable to establish care with a primary MD.  He is currently buying insulin without prescription.  He is struggling to find a primary MD.  The history is provided by the patient.       Home Medications Prior to Admission medications   Medication Sig Start Date End Date Taking? Authorizing Provider  acetaminophen (TYLENOL) 500 MG tablet Take 1,000 mg by mouth every 6 (six) hours as needed for mild pain.   Yes [provider]  amLODipine (NORVASC) 10 MG tablet Take 1 tablet (10 mg total) by mouth daily. 04/30/22   Isla Pence, MD  chlordiazePOXIDE (LIBRIUM) 25 MG capsule 50mg  PO TID x 1D, then 25-50mg  PO BID X 1D, then 25-50mg  PO QD X 1D Patient not taking: Reported on 04/30/2022 04/09/22   Carmin Muskrat, MD  insulin aspart (NOVOLOG) 100 UNIT/ML injection Inject 15 Units into the skin 3 (three) times daily with meals. 04/30/22   Isla Pence, MD  insulin glargine (LANTUS) 100 UNIT/ML injection Inject 0.5 mLs (50 Units total) into the skin at bedtime. 04/30/22 05/30/22  Isla Pence, MD  insulin NPH-regular Human (70-30) 100 UNIT/ML injection Inject 15 Units into the skin 3 (three) times daily. Patient not taking: Reported on 04/30/2022 01/23/22   Pattricia Boss, MD  Insulin Syringe-Needle U-100 Flossie Buffy INSULIN SYRINGE) 30G X 5/16" 0.3 ML MISC Use 3 (three) times daily with insulin 04/30/22   Isla Pence, MD  lisinopril (ZESTRIL) 10 MG tablet Take 1 tablet (10 mg total) by mouth daily. 04/30/22   Isla Pence, MD      Allergies    Patient has no known allergies.    Review of Systems   Review of Systems  Constitutional:  Negative for chills and fever.  HENT:  Negative for congestion and sore throat.   Eyes: Negative.   Respiratory:  Positive for shortness of breath. Negative for chest tightness.   Cardiovascular:  Positive for chest pain. Negative for palpitations and leg swelling.  Gastrointestinal:  Negative for abdominal pain, nausea and vomiting.  Endocrine: Positive for polydipsia and polyuria.  Genitourinary: Negative.   Musculoskeletal:  Negative for arthralgias, joint swelling and neck pain.  Skin: Negative.  Negative for rash and wound.  Neurological:  Negative for dizziness, weakness, light-headedness,  numbness and headaches.  Psychiatric/Behavioral: Negative.    All other systems reviewed and are negative.   Physical Exam Updated Vital Signs BP (!) 138/95 (BP Location: Right Arm)   Pulse 84   Temp 98.4 F (36.9 C) (Oral)   Resp 16   Ht 5\' 11"  (1.803 m)   Wt 99.8 kg   SpO2 100%   BMI 30.68 kg/m  Physical Exam Vitals and nursing note reviewed.  Constitutional:      Appearance: He is well-developed.  HENT:     Head: Normocephalic and atraumatic.   Eyes:     Conjunctiva/sclera: Conjunctivae normal.  Cardiovascular:     Rate and Rhythm: Normal rate and regular rhythm.     Heart sounds: Normal heart sounds.  Pulmonary:     Effort: Pulmonary effort is normal.     Breath sounds: Normal breath sounds. No wheezing.  Abdominal:     General: Bowel sounds are normal.     Palpations: Abdomen is soft.     Tenderness: There is no abdominal tenderness.  Musculoskeletal:        General: Normal range of motion.     Cervical back: Normal range of motion.  Skin:    General: Skin is warm and dry.  Neurological:     Mental Status: He is alert.     ED Results / Procedures / Treatments   Labs (all labs ordered are listed, but only abnormal results are displayed) Labs Reviewed  BASIC METABOLIC PANEL - Abnormal; Notable for the following components:      Result Value   Sodium 134 (*)    Chloride 97 (*)    Glucose, Bld 371 (*)    All other components within normal limits  URINALYSIS, ROUTINE W REFLEX MICROSCOPIC - Abnormal; Notable for the following components:   Specific Gravity, Urine 1.033 (*)    Glucose, UA >=500 (*)    Hgb urine dipstick SMALL (*)    Ketones, ur 20 (*)    All other components within normal limits  CBG MONITORING, ED - Abnormal; Notable for the following components:   Glucose-Capillary 365 (*)    All other components within normal limits  CBC WITH DIFFERENTIAL/PLATELET  D-DIMER, QUANTITATIVE  CBG MONITORING, ED  TROPONIN I (HIGH SENSITIVITY)    EKG EKG Interpretation  Date/Time:  Tuesday April 30 2022 10:04:23 EDT Ventricular Rate:  62 PR Interval:  125 QRS Duration: 112 QT Interval:  435 QTC Calculation: 442 R Axis:   -61 Text Interpretation: Sinus rhythm Borderline IVCD with LAD Since last tracing rate slower Confirmed by Isla Pence [phone]) on 04/30/2022 11:22:14 AM  Radiology DG Chest Portable 1 View  Result Date: 04/30/2022 CLINICAL DATA:  Chest pain EXAM: PORTABLE CHEST 1 VIEW COMPARISON:   04/15/2022 FINDINGS: Transverse diameter of heart is slightly increased. This may be partly due to poor inspiration. Lung fields are clear of any infiltrates or pulmonary edema. There is no pleural effusion or pneumothorax. IMPRESSION: No active disease. Electronically Signed   By: Elmer Picker M.D.   On: 04/30/2022 11:58    Procedures Procedures    Medications Ordered in ED Medications  sodium chloride 0.9 % bolus 1,000 mL (1,000 mLs Intravenous New Bag/Given 04/30/22 1123)  aspirin chewable tablet 324 mg (324 mg Oral Given 04/30/22 1115)  insulin aspart (novoLOG) injection 8 Units (8 Units Subcutaneous Given 04/30/22 1548)    ED Course/ Medical Decision Making/ A&P  Medical Decision Making Initially patient was ordered a 1 L bolus of normal saline.  Unfortunately the IV placement created difficulty with given this fluid.  At time of recheck of his CBG which only improved to 332 he had only received about 100 cc of this bolus.  His IV was adjusted and he is now getting the full liter.  He was also given 8 units of insulin aspart.  Will plan discharge home once his CBG is improved.  Patient presenting with hyperglycemia and chest pain.  He is not in DKA.  His pain is sharp, midsternal, worsened with deep inspiration and somewhat reproducible with palpation in movement.  Troponins are negative, doubt ACS, D-dimer is negative, PE unlikely.  At reexam patient is symptom-free.  Patient expressing concern over difficulty getting back on his insulins, no current PCP.  He is taking 70/30, using 35 units with each meal but still having significant hyperglycemia.  Consult placed to our social worker who was very helpful in providing him assistance with getting his Lantus and his NovoLog, also his blood pressure medications were included in prescriptions.  Social worker will also get patient in with PCP for follow up care.   Recheck CBG prior to discharge home 242.  Amount  and/or Complexity of Data Reviewed Labs: ordered.    Details: Initial glucose here is 371.  No evidence for DKA.  Troponin and D-dimer negative. Radiology: ordered and independent interpretation performed.    Details: Normal chest x-ray. ECG/medicine tests: ordered.    Details: Reviewed, sinus rhythm, rate 62.  Risk Prescription drug management.           Final Clinical Impression(s) / ED Diagnoses Final diagnoses:  None    Rx / DC Orders ED Discharge Orders          Ordered    amLODipine (NORVASC) 10 MG tablet  Daily        04/30/22 1350    insulin aspart (NOVOLOG) 100 UNIT/ML injection  3 times daily with meals        04/30/22 1350    insulin glargine (LANTUS) 100 UNIT/ML injection  Daily at bedtime        04/30/22 1350    lisinopril (ZESTRIL) 10 MG tablet  Daily        04/30/22 1350    Insulin Syringe-Needle U-100 ([employer]ARE INSULIN SYRINGE) 30G X 5/16" 0.3 ML MISC  3 times daily        04/30/22 1350              Evalee Jefferson, PA-C 04/30/22 1709    Isla Pence, MD 05/03/22 1706

## 2022-04-30 NOTE — Discharge Instructions (Signed)
Please pick up your medications the morning.  Contact a doctor if: Your blood sugar level is at or above 240 mg/dL (13.3 mmol/L) for 2 days in a row. You have problems keeping your blood sugar in your target range. You have high blood pressure often. You have signs of illness, such as: Feeling like you may vomit (feeling nauseous). Vomiting. A fever. Get help right away if: Your blood sugar monitor reads "high" even when you are taking insulin. You have trouble breathing. You have a change in how you think, feel, or act (mental status). You feel like you may vomit, and the feeling does not go away. You cannot stop vomiting. These symptoms may be an emergency. Get medical help right away. Call your local emergency services (911 in the [place] Do not wait to see if the symptoms will go away. Do not drive yourself to the hospital.

## 2022-04-30 NOTE — ED Notes (Signed)
Pt in bed, pt states that he is feeling better and ready to go home, pt verbalized understanding d/c instructions, pt states that his sister is coming to get him.

## 2022-04-30 NOTE — ED Triage Notes (Signed)
Pt to the ED with hyperglycemia that was 200 when he checked it this morning.  Pt states he took his 70/30 insulin and after an hour the CBG had not dropped.  He is also complaining of chest pain.  Pt states he is not aware of which pharmacies accept his [employer].

## 2022-04-30 NOTE — ED Triage Notes (Addendum)
  Patient comes in stating that his CBG is high and he has been urinating frequently.  Patient was discharged earlier around 1700 and has been outside in the lobby eating snacks while waiting on his ride.  Patient states the pharmacy is now closed and he is unable to get his insulin.  Patient requesting help with his hyperglycemia so he can go home.  No pain at this time.  CBG 375 in triage.

## 2022-04-30 NOTE — ED Notes (Signed)
Poc blood sugar is 240, PA notified, pt requests food, food given after PA approval.

## 2022-04-30 NOTE — ED Provider Notes (Signed)
[facility] EMERGENCY DEPARTMENT Provider Note   CSN: [id number] Arrival date & time: 04/30/22  2015     History Chief Complaint  Patient presents with   Hyperglycemia   Polyuria    [name] [name] is a 36 y.o. male with history of noncompliant type 2 diabetes, polysubstance use presents the emergency part for evaluation of high blood sugar.  Patient was seen prior and was discharged home.  He had social work arrange to have his insulin and high blood pressure medications covered however the patient sat in the waiting room and was eating peanut butter crackers waiting for his ride when the pharmacy closed.  He was not able to pick up his insulin so he checked in.  No new symptoms.   Hyperglycemia      Home Medications Prior to Admission medications   Medication Sig Start Date End Date Taking? Authorizing Provider  acetaminophen (TYLENOL) 500 MG tablet Take 1,000 mg by mouth every 6 (six) hours as needed for mild pain.    [provider]  amLODipine (NORVASC) 10 MG tablet Take 1 tablet (10 mg total) by mouth daily. 04/30/22   Isla Pence, MD  chlordiazePOXIDE (LIBRIUM) 25 MG capsule 50mg  PO TID x 1D, then 25-50mg  PO BID X 1D, then 25-50mg  PO QD X 1D Patient not taking: Reported on 04/30/2022 04/09/22   Carmin Muskrat, MD  insulin aspart (NOVOLOG) 100 UNIT/ML injection Inject 15 Units into the skin 3 (three) times daily with meals. 04/30/22   Isla Pence, MD  insulin glargine (LANTUS) 100 UNIT/ML injection Inject 0.5 mLs (50 Units total) into the skin at bedtime. 04/30/22 05/30/22  Isla Pence, MD  insulin NPH-regular Human (70-30) 100 UNIT/ML injection Inject 15 Units into the skin 3 (three) times daily. Patient not taking: Reported on 04/30/2022 01/23/22   Pattricia Boss, MD  Insulin Syringe-Needle U-100 Flossie Buffy INSULIN SYRINGE) 30G X 5/16" 0.3 ML MISC Use 3 (three) times daily with insulin 04/30/22   Isla Pence, MD  lisinopril (ZESTRIL) 10 MG tablet Take 1  tablet (10 mg total) by mouth daily. 04/30/22   Isla Pence, MD      Allergies    Patient has no known allergies.    Review of Systems   Review of Systems See HPI Physical Exam Updated Vital Signs BP (!) 127/94 (BP Location: Right Arm)   Pulse 96   Temp 98 F (36.7 C) (Oral)   Resp 13   Ht 5\' 11"  (1.803 m)   Wt 99.8 kg   SpO2 100%   BMI 30.68 kg/m  Physical Exam Vitals and nursing note reviewed.  Constitutional:      General: He is not in acute distress.    Appearance: Normal appearance. He is not ill-appearing or toxic-appearing.     Comments: Consistently on phone, no acute distress  Eyes:     General: No scleral icterus. Pulmonary:     Effort: Pulmonary effort is normal. No respiratory distress.  Skin:    General: Skin is dry.     Findings: No rash.  Neurological:     General: No focal deficit present.     Mental Status: He is alert. Mental status is at baseline.  Psychiatric:        Mood and Affect: Mood normal.     ED Results / Procedures / Treatments   Labs (all labs ordered are listed, but only abnormal results are displayed) Labs Reviewed  CBG MONITORING, ED - Abnormal; Notable for the following components:  Result Value   Glucose-Capillary 375 (*)    All other components within normal limits  CBG MONITORING, ED - Abnormal; Notable for the following components:   Glucose-Capillary 337 (*)    All other components within normal limits    EKG None  Radiology DG Chest Portable 1 View  Result Date: 04/30/2022 CLINICAL DATA:  Chest pain EXAM: PORTABLE CHEST 1 VIEW COMPARISON:  04/15/2022 FINDINGS: Transverse diameter of heart is slightly increased. This may be partly due to poor inspiration. Lung fields are clear of any infiltrates or pulmonary edema. There is no pleural effusion or pneumothorax. IMPRESSION: No active disease. Electronically Signed   By: Ernie Avena M.D.   On: 04/30/2022 11:58    Procedures Procedures   Medications  Ordered in ED Medications - No data to display  ED Course/ Medical Decision Making/ A&P                           Medical Decision Making Risk Prescription drug management.   36 year old male presents the emergency room for evaluation of hyperglycemia after not taking his insulin.  Patient was just seen earlier today.  His lab work was unremarkable.  No signs of DKA.  Highly doubt patient became acidotic within the past few hours of him being discharged.  His sugar has increased given he was eating peanut butter crackers and other snacks in the waiting room without taking any coverage.  I spoke with Jill Alexanders from pharmacy and will order 10 units of aspart.  Patient's prescriptions were covered by social work earlier today and patient began up at the [facility].  His sugar went from 375 to 337 and is down trending. Patient can be discharged and pick up his medications in the morning.  Hyperglycemia information given.  Patient is stable and being discharged in good condition.  I discussed this case with my attending physician who cosigned this note including patient's presenting symptoms, physical exam, and planned diagnostics and interventions. Attending physician stated agreement with plan or made changes to plan which were implemented.   Final Clinical Impression(s) / ED Diagnoses Final diagnoses:  Hyperglycemia    Rx / DC Orders ED Discharge Orders     None         Achille Rich, PA-C 04/30/22 2311    Gilda Crease, MD 05/01/22 [phone]

## 2022-04-30 NOTE — ED Notes (Addendum)
TOC consulted as pt is unable to afford medications. CSW spoke with pt about insurance status. Pt states that he no longer has insurance coverage. CSW explained [employer] voucher that can be provided and that it will assist with a 30 day supply of his medications. CSW explained [facility] to pt, he is agreeable to a referral being made. CSW left HIPPA compliant voicemail requesting return call so referral can be made. CSW delivered [facility] voucher to pt in room. CSW updated MD of this. TOC signing off.

## 2022-04-30 NOTE — Discharge Instructions (Signed)
Your prescriptions have been sent to [facility] here in [place] and is my understanding that our social worker has arranged for you to be able to pick up these medications without payment.  Make sure you get them picked up and start taking them as instructed.  She is also working on arranging a primary care provider for you.  In the interim return here if you have any problems or concerns.  Make sure you are keeping a close watch on your blood glucose levels.  Also watch your dietary intake to help also manage your diabetes.

## 2022-05-01 ENCOUNTER — Other Ambulatory Visit (HOSPITAL_COMMUNITY): Payer: Self-pay

## 2022-05-01 LAB — CBG MONITORING, ED
Glucose-Capillary: 332 mg/dL — ABNORMAL HIGH (ref 70–99)
Glucose-Capillary: 316 mg/dL — ABNORMAL HIGH (ref 70–99)

## 2022-05-02 ENCOUNTER — Other Ambulatory Visit (HOSPITAL_COMMUNITY): Payer: Self-pay

## 2022-05-03 ENCOUNTER — Telehealth: Payer: Self-pay

## 2022-05-03 NOTE — Telephone Encounter (Signed)
Attempted to reach out to patient after recent [facility] referral from Education officer, museum. I attempted to call the number on file and it sent me to an automatic voicemail of a medical provider.

## 2022-05-08 ENCOUNTER — Encounter (HOSPITAL_COMMUNITY): Payer: Self-pay

## 2022-05-08 ENCOUNTER — Other Ambulatory Visit: Payer: Self-pay

## 2022-05-08 ENCOUNTER — Inpatient Hospital Stay (HOSPITAL_COMMUNITY): Payer: No Typology Code available for payment source

## 2022-05-08 ENCOUNTER — Inpatient Hospital Stay (HOSPITAL_COMMUNITY)
Admission: EM | Admit: 2022-05-08 | Discharge: 2022-05-10 | DRG: 638 | Disposition: A | Discharge status: Home / Self Care | Payer: No Typology Code available for payment source | Attending: Family Medicine | Admitting: Family Medicine

## 2022-05-08 DIAGNOSIS — Z79899 Other long term (current) drug therapy: Secondary | ICD-10-CM

## 2022-05-08 DIAGNOSIS — I10 Essential (primary) hypertension: Secondary | ICD-10-CM | POA: Diagnosis present

## 2022-05-08 DIAGNOSIS — Z597 Insufficient social insurance and welfare support: Secondary | ICD-10-CM | POA: Diagnosis not present

## 2022-05-08 DIAGNOSIS — E1110 Type 2 diabetes mellitus with ketoacidosis without coma: Secondary | ICD-10-CM | POA: Diagnosis not present

## 2022-05-08 DIAGNOSIS — N179 Acute kidney failure, unspecified: Secondary | ICD-10-CM

## 2022-05-08 DIAGNOSIS — E876 Hypokalemia: Secondary | ICD-10-CM | POA: Diagnosis present

## 2022-05-08 DIAGNOSIS — Z91148 Patient's other noncompliance with medication regimen for other reason: Secondary | ICD-10-CM | POA: Diagnosis not present

## 2022-05-08 DIAGNOSIS — Z794 Long term (current) use of insulin: Secondary | ICD-10-CM | POA: Diagnosis not present

## 2022-05-08 DIAGNOSIS — R7989 Other specified abnormal findings of blood chemistry: Secondary | ICD-10-CM

## 2022-05-08 DIAGNOSIS — F32A Depression, unspecified: Secondary | ICD-10-CM | POA: Diagnosis present

## 2022-05-08 DIAGNOSIS — E860 Dehydration: Secondary | ICD-10-CM | POA: Diagnosis present

## 2022-05-08 DIAGNOSIS — E1100 Type 2 diabetes mellitus with hyperosmolarity without nonketotic hyperglycemic-hyperosmolar coma (NKHHC): Secondary | ICD-10-CM | POA: Diagnosis present

## 2022-05-08 DIAGNOSIS — R079 Chest pain, unspecified: Secondary | ICD-10-CM | POA: Diagnosis present

## 2022-05-08 DIAGNOSIS — T383X6A Underdosing of insulin and oral hypoglycemic [antidiabetic] drugs, initial encounter: Secondary | ICD-10-CM | POA: Diagnosis present

## 2022-05-08 DIAGNOSIS — R0602 Shortness of breath: Secondary | ICD-10-CM | POA: Diagnosis present

## 2022-05-08 DIAGNOSIS — E1159 Type 2 diabetes mellitus with other circulatory complications: Secondary | ICD-10-CM

## 2022-05-08 DIAGNOSIS — R739 Hyperglycemia, unspecified: Principal | ICD-10-CM

## 2022-05-08 DIAGNOSIS — I152 Hypertension secondary to endocrine disorders: Secondary | ICD-10-CM

## 2022-05-08 DIAGNOSIS — E119 Type 2 diabetes mellitus without complications: Secondary | ICD-10-CM

## 2022-05-08 DIAGNOSIS — F17210 Nicotine dependence, cigarettes, uncomplicated: Secondary | ICD-10-CM | POA: Diagnosis present

## 2022-05-08 HISTORY — DX: Other specified abnormal findings of blood chemistry: R79.89

## 2022-05-08 HISTORY — DX: Type 2 diabetes mellitus with hyperosmolarity without nonketotic hyperglycemic-hyperosmolar coma (NKHHC): E11.00

## 2022-05-08 HISTORY — DX: Type 2 diabetes mellitus with ketoacidosis without coma: E11.10

## 2022-05-08 HISTORY — DX: Essential (primary) hypertension: I10

## 2022-05-08 LAB — URINALYSIS, ROUTINE W REFLEX MICROSCOPIC
Bilirubin Urine: NEGATIVE
Glucose, UA: >=500 mg/dL — AB
Hgb urine dipstick: NEGATIVE
Ketones, ur: 5 mg/dL — AB
Leukocytes,Ua: NEGATIVE
Nitrite: NEGATIVE
Protein, ur: NEGATIVE mg/dL
Specific Gravity, Urine: 1.028 (ref 1.005–1.030)
pH: 5 (ref 5.0–8.0)

## 2022-05-08 LAB — ALBUMIN: Albumin: 4.1 g/dL (ref 3.5–5.0)

## 2022-05-08 LAB — GLUCOSE, CAPILLARY
Glucose-Capillary: 123 mg/dL — ABNORMAL HIGH (ref 70–99)
Glucose-Capillary: 301 mg/dL — ABNORMAL HIGH (ref 70–99)
Glucose-Capillary: 326 mg/dL — ABNORMAL HIGH (ref 70–99)
Glucose-Capillary: 331 mg/dL — ABNORMAL HIGH (ref 70–99)
Glucose-Capillary: 337 mg/dL — ABNORMAL HIGH (ref 70–99)
Glucose-Capillary: 72 mg/dL (ref 70–99)
Glucose-Capillary: 81 mg/dL (ref 70–99)

## 2022-05-08 LAB — CBC WITH DIFFERENTIAL/PLATELET
Abs Immature Granulocytes: 0.01 10*3/uL (ref 0.00–0.07)
Basophils Absolute: 0 10*3/uL (ref 0.0–0.1)
Basophils Relative: 1 %
Eosinophils Absolute: 0.1 10*3/uL (ref 0.0–0.5)
Eosinophils Relative: 2 %
HCT: 48.3 % (ref 39.0–52.0)
Hemoglobin: 16.7 g/dL (ref 13.0–17.0)
Immature Granulocytes: 0 %
Lymphocytes Relative: 26 %
Lymphs Abs: 1.2 10*3/uL (ref 0.7–4.0)
MCH: 28.9 pg (ref 26.0–34.0)
MCHC: 34.6 g/dL (ref 30.0–36.0)
MCV: 83.6 fL (ref 80.0–100.0)
Monocytes Absolute: 0.5 10*3/uL (ref 0.1–1.0)
Monocytes Relative: 12 %
Neutro Abs: 2.8 10*3/uL (ref 1.7–7.7)
Neutrophils Relative %: 59 %
Platelets: 186 10*3/uL (ref 150–400)
RBC: 5.78 MIL/uL (ref 4.22–5.81)
RDW: 12.8 % (ref 11.5–15.5)
WBC: 4.6 10*3/uL (ref 4.0–10.5)
nRBC: 0 % (ref 0.0–0.2)

## 2022-05-08 LAB — CBG MONITORING, ED
Glucose-Capillary: >600 mg/dL (ref 70–99)
Glucose-Capillary: >600 mg/dL (ref 70–99)
Glucose-Capillary: 486 mg/dL — ABNORMAL HIGH (ref 70–99)
Glucose-Capillary: 543 mg/dL (ref 70–99)
Glucose-Capillary: 374 mg/dL — ABNORMAL HIGH (ref 70–99)
Glucose-Capillary: 239 mg/dL — ABNORMAL HIGH (ref 70–99)
Glucose-Capillary: 207 mg/dL — ABNORMAL HIGH (ref 70–99)

## 2022-05-08 LAB — BASIC METABOLIC PANEL
Anion gap: 10 (ref 5–15)
Anion gap: 9 (ref 5–15)
BUN: 16 mg/dL (ref 6–20)
BUN: 14 mg/dL (ref 6–20)
CO2: 27 mmol/L (ref 22–32)
CO2: 27 mmol/L (ref 22–32)
Calcium: 9.4 mg/dL (ref 8.9–10.3)
Calcium: 9.7 mg/dL (ref 8.9–10.3)
Chloride: 88 mmol/L — ABNORMAL LOW (ref 98–111)
Chloride: 100 mmol/L (ref 98–111)
Creatinine, Ser: 1.35 mg/dL — ABNORMAL HIGH (ref 0.61–1.24)
Creatinine, Ser: 1.12 mg/dL (ref 0.61–1.24)
GFR, Estimated: >60 mL/min (ref >60)
GFR, Estimated: >60 mL/min (ref >60)
Glucose, Bld: 754 mg/dL (ref 70–99)
Glucose, Bld: 185 mg/dL — ABNORMAL HIGH (ref 70–99)
Potassium: 4.6 mmol/L (ref 3.5–5.1)
Potassium: 3.5 mmol/L (ref 3.5–5.1)
Sodium: 125 mmol/L — ABNORMAL LOW (ref 135–145)
Sodium: 136 mmol/L (ref 135–145)

## 2022-05-08 LAB — PHOSPHORUS: Phosphorus: 3.8 mg/dL (ref 2.5–4.6)

## 2022-05-08 LAB — MRSA NEXT GEN BY PCR, NASAL: MRSA by PCR Next Gen: NOT DETECTED

## 2022-05-08 LAB — OSMOLALITY: Osmolality: 291 mOsm/kg (ref 275–295)

## 2022-05-08 LAB — BETA-HYDROXYBUTYRIC ACID: Beta-Hydroxybutyric Acid: 1.29 mmol/L — ABNORMAL HIGH (ref 0.05–0.27)

## 2022-05-08 MED ORDER — ENOXAPARIN SODIUM 40 MG/0.4ML IJ SOSY
40.0000 mg | PREFILLED_SYRINGE | INTRAMUSCULAR | Status: DC
  Administered 2022-05-08 – 2022-05-09 (×2): 40 mg via SUBCUTANEOUS
  Filled 2022-05-08 (×2): qty 0.4

## 2022-05-08 MED ORDER — INSULIN ASPART 100 UNIT/ML IJ SOLN
0.0000 [IU] | Freq: Three times a day (TID) | INTRAMUSCULAR | Status: DC
  Administered 2022-05-09 (×2): 15 [IU] via SUBCUTANEOUS
  Administered 2022-05-09 – 2022-05-10 (×2): 5 [IU] via SUBCUTANEOUS

## 2022-05-08 MED ORDER — DEXTROSE IN LACTATED RINGERS 5 % IV SOLN: INTRAVENOUS | Status: DC

## 2022-05-08 MED ORDER — DEXTROSE 50 % IV SOLN: 0.0000 mL | INTRAVENOUS | Status: DC | PRN

## 2022-05-08 MED ORDER — INSULIN REGULAR(HUMAN) IN NACL 100-0.9 UT/100ML-% IV SOLN
INTRAVENOUS | Status: DC
  Administered 2022-05-08: 10.5 [IU]/h via INTRAVENOUS
  Filled 2022-05-08: qty 100

## 2022-05-08 MED ORDER — POTASSIUM CHLORIDE 10 MEQ/100ML IV SOLN
10.0000 meq | INTRAVENOUS | Status: AC
  Administered 2022-05-08 (×2): 10 meq via INTRAVENOUS
  Filled 2022-05-08 (×2): qty 100

## 2022-05-08 MED ORDER — PROCHLORPERAZINE EDISYLATE 10 MG/2ML IJ SOLN: 10.0000 mg | Freq: Four times a day (QID) | INTRAMUSCULAR | Status: DC | PRN

## 2022-05-08 MED ORDER — INSULIN ASPART 100 UNIT/ML IJ SOLN: 5.0000 [IU] | Freq: Once | INTRAMUSCULAR | Status: DC

## 2022-05-08 MED ORDER — INSULIN ASPART 100 UNIT/ML IJ SOLN
0.0000 [IU] | Freq: Every day | INTRAMUSCULAR | Status: DC
  Administered 2022-05-09: 4 [IU] via SUBCUTANEOUS

## 2022-05-08 MED ORDER — LACTATED RINGERS IV SOLN: INTRAVENOUS | Status: DC

## 2022-05-08 MED ORDER — INSULIN GLARGINE-YFGN 100 UNIT/ML ~~LOC~~ SOLN
20.0000 [IU] | Freq: Every day | SUBCUTANEOUS | Status: DC
  Administered 2022-05-08 – 2022-05-09 (×2): 20 [IU] via SUBCUTANEOUS
  Filled 2022-05-08 (×2): qty 0.2

## 2022-05-08 MED ORDER — DEXTROSE 50 % IV SOLN
0.0000 mL | INTRAVENOUS | Status: DC | PRN
  Administered 2022-05-08: 25 mL via INTRAVENOUS
  Filled 2022-05-08: qty 50

## 2022-05-08 MED ORDER — ORAL CARE MOUTH RINSE: 15.0000 mL | OROMUCOSAL | Status: DC | PRN

## 2022-05-08 MED ORDER — INSULIN REGULAR(HUMAN) IN NACL 100-0.9 UT/100ML-% IV SOLN: INTRAVENOUS | Status: DC

## 2022-05-08 MED ORDER — AMLODIPINE BESYLATE 10 MG PO TABS
10.0000 mg | ORAL_TABLET | Freq: Every day | ORAL | Status: DC
  Administered 2022-05-08 – 2022-05-10 (×3): 10 mg via ORAL
  Filled 2022-05-08: qty 2
  Filled 2022-05-08 (×2): qty 1

## 2022-05-08 MED ORDER — INSULIN ASPART 100 UNIT/ML IJ SOLN
10.0000 [IU] | Freq: Once | INTRAMUSCULAR | Status: AC
  Administered 2022-05-08: 10 [IU] via SUBCUTANEOUS

## 2022-05-08 MED ORDER — CHLORHEXIDINE GLUCONATE CLOTH 2 % EX PADS
6.0000 | MEDICATED_PAD | Freq: Every day | CUTANEOUS | Status: DC
  Administered 2022-05-08 – 2022-05-09 (×2): 6 via TOPICAL

## 2022-05-08 NOTE — ED Triage Notes (Signed)
Pt to er, pt states that he was seen at [facility] last week for the same, states that he is here for elevated blood sugar, states that he is worried that he might be in dka, states that he doesn't have money to get his insulin.

## 2022-05-08 NOTE — H&P (Addendum)
History and Physical    Patient: [name] [name]:[id number] DOB: [birth year] DOA: 05/08/2022 DOS: the patient was seen and examined on 05/08/2022 PCP: Pcp, No  Patient coming from: Home  Chief Complaint:  Chief Complaint  Patient presents with   Hyperglycemia   HPI: [name] [name] is a 36 y.o. male with medical history significant of DMt2, HTN, depression. Presenting with polyuria and nausea. He reports that he was in his normal state of health until yesterday. He noticed that he was peeing a lot. He also had some nausea/vomiting. He didn't have any fevers or sick contacts. He reported chest pain to the ED team; however, he denies any pain during our interview. He reports that he's been out of his insulin for over a week. When his symptoms continued today, he thought he might be having a problem with his sugar, so he came to the ED for evaluation. He denies any other aggravating or alleviating factors.    Review of Systems: As mentioned in the history of present illness. All other systems reviewed and are negative. Past Medical History:  Diagnosis Date   Diabetes mellitus without complication (HCC)    Past Surgical History:  Procedure Laterality Date   HAND SURGERY     Social History:  reports that he has been smoking cigarettes. He has a 5.00 pack-year smoking history. He has never used smokeless tobacco. He reports current alcohol use. He reports that he does not use drugs.  No Known Allergies  Family History  Family history unknown: Yes    Prior to Admission medications   Medication Sig Start Date End Date Taking? Authorizing Provider  amLODipine (NORVASC) 10 MG tablet Take 1 tablet (10 mg total) by mouth daily. 04/30/22  Yes Jacalyn Lefevre, MD  insulin aspart (NOVOLOG) 100 UNIT/ML injection Inject 15 Units into the skin 3 (three) times daily with meals. 04/30/22  Yes Jacalyn Lefevre, MD  insulin glargine (LANTUS) 100 UNIT/ML injection Inject 0.5 mLs (50 Units total) into the  skin at bedtime. 04/30/22 05/30/22 Yes Jacalyn Lefevre, MD  lisinopril (ZESTRIL) 10 MG tablet Take 1 tablet (10 mg total) by mouth daily. 04/30/22  Yes Jacalyn Lefevre, MD  chlordiazePOXIDE (LIBRIUM) 25 MG capsule 50mg  PO TID x 1D, then 25-50mg  PO BID X 1D, then 25-50mg  PO QD X 1D Patient not taking: Reported on 04/30/2022 04/09/22   06/09/22, MD  insulin NPH-regular Human (70-30) 100 UNIT/ML injection Inject 15 Units into the skin 3 (three) times daily. Patient not taking: Reported on 04/30/2022 01/23/22   01/25/22, MD    Physical Exam: Vitals:   05/08/22 1207 05/08/22 1215 05/08/22 1230 05/08/22 1245  BP: (!) 164/104 (!) 176/111 (!) 166/108 (!) 140/104  Pulse: 68 84 88 84  Resp: 12 17 (!) 21 14  Temp:      TempSrc:      SpO2: 100% 99% 100% 99%  Weight:      Height:       General: 36 y.o. male resting in bed in NAD Eyes: PERRL, normal sclera ENMT: Nares patent w/o discharge, orophaynx clear, dentition normal, ears w/o discharge/lesions/ulcers Neck: Supple, trachea midline Cardiovascular: RRR, +S1, S2, no m/g/r, equal pulses throughout Respiratory: CTABL, no w/r/r, normal WOB GI: BS+, NDNT, no masses noted, no organomegaly noted MSK: No e/c/c Neuro: A&O x 3, no focal deficits Psyc: Appropriate interaction and affect, calm/cooperative  Data Reviewed:  Results for orders placed or performed during the hospital encounter of 05/08/22 (from the past 24 hour(s))  CBG monitoring, ED     Status: Abnormal   Collection Time: 05/08/22  9:42 AM  Result Value Ref Range   Glucose-Capillary >600 (HH) 70 - 99 mg/dL  Urinalysis, Routine w reflex microscopic     Status: Abnormal   Collection Time: 05/08/22 10:40 AM  Result Value Ref Range   Color, Urine STRAW (A) YELLOW   APPearance CLEAR CLEAR   Specific Gravity, Urine 1.028 1.005 - 1.030   pH 5.0 5.0 - 8.0   Glucose, UA >=500 (A) NEGATIVE mg/dL   Hgb urine dipstick NEGATIVE NEGATIVE   Bilirubin Urine NEGATIVE NEGATIVE   Ketones,  ur 5 (A) NEGATIVE mg/dL   Protein, ur NEGATIVE NEGATIVE mg/dL   Nitrite NEGATIVE NEGATIVE   Leukocytes,Ua NEGATIVE NEGATIVE   RBC / HPF 0-5 0 - 5 RBC/hpf   WBC, UA 0-5 0 - 5 WBC/hpf   Bacteria, UA RARE (A) NONE SEEN   Squamous Epithelial / LPF 0-5 0 - 5  Basic metabolic panel     Status: Abnormal   Collection Time: 05/08/22 10:54 AM  Result Value Ref Range   Sodium 125 (L) 135 - 145 mmol/L   Potassium 4.6 3.5 - 5.1 mmol/L   Chloride 88 (L) 98 - 111 mmol/L   CO2 27 22 - 32 mmol/L   Glucose, Bld 754 (HH) 70 - 99 mg/dL   BUN 16 6 - 20 mg/dL   Creatinine, Ser 7.41 (H) 0.61 - 1.24 mg/dL   Calcium 9.4 8.9 - 63.8 mg/dL   GFR, Estimated >45 >36 mL/min   Anion gap 10 5 - 15  CBC with Differential (PNL)     Status: None   Collection Time: 05/08/22 10:54 AM  Result Value Ref Range   WBC 4.6 4.0 - 10.5 K/uL   RBC 5.78 4.22 - 5.81 MIL/uL   Hemoglobin 16.7 13.0 - 17.0 g/dL   HCT 46.8 03.2 - 12.2 %   MCV 83.6 80.0 - 100.0 fL   MCH 28.9 26.0 - 34.0 pg   MCHC 34.6 30.0 - 36.0 g/dL   RDW 48.2 50.0 - 37.0 %   Platelets 186 150 - 400 K/uL   nRBC 0.0 0.0 - 0.2 %   Neutrophils Relative % 59 %   Neutro Abs 2.8 1.7 - 7.7 K/uL   Lymphocytes Relative 26 %   Lymphs Abs 1.2 0.7 - 4.0 K/uL   Monocytes Relative 12 %   Monocytes Absolute 0.5 0.1 - 1.0 K/uL   Eosinophils Relative 2 %   Eosinophils Absolute 0.1 0.0 - 0.5 K/uL   Basophils Relative 1 %   Basophils Absolute 0.0 0.0 - 0.1 K/uL   Immature Granulocytes 0 %   Abs Immature Granulocytes 0.01 0.00 - 0.07 K/uL  Beta-hydroxybutyric acid     Status: Abnormal   Collection Time: 05/08/22 10:54 AM  Result Value Ref Range   Beta-Hydroxybutyric Acid 1.29 (H) 0.05 - 0.27 mmol/L  CBG monitoring, ED     Status: Abnormal   Collection Time: 05/08/22 11:58 AM  Result Value Ref Range   Glucose-Capillary >600 (HH) 70 - 99 mg/dL  CBG monitoring, ED     Status: Abnormal   Collection Time: 05/08/22 12:35 PM  Result Value Ref Range   Glucose-Capillary 543  (HH) 70 - 99 mg/dL  CBG monitoring, ED     Status: Abnormal   Collection Time: 05/08/22  1:08 PM  Result Value Ref Range   Glucose-Capillary 486 (H) 70 - 99 mg/dL   CXR: No active  disease.  Assessment and Plan: HHS Uncontrolled DM2     - admit to inpt, SDU     - continue Endotool     - may have non-caloric fluids, meds; otherwise, NPO     - non-compliance w/ insulin; consult DM coordinator  HTN     - continue home amlodipine; hold ACEi d/t AKI  Pseudohyponatremia     - secondary to above     - Na+ corrects to 135  AKI     - secondary to above     - fluids     - renal US     - watch nephrotoxins  Advance Care Planning:   Code Status: FULL  Consults: None  Family Communication: None at bedside  Severity of Illness: The appropriate patient status for this patient is INPATIENT. Inpatient status is judged to be reasonable and necessary in order to provide the required intensity of service to ensure the patient's safety. The patient's presenting symptoms, physical exam findings, and initial radiographic and laboratory data in the context of their chronic comorbidities is felt to place them at high risk for further clinical deterioration. Furthermore, it is not anticipated that the patient will be medically stable for discharge from the hospital within 2 midnights of admission.   * I certify that at the point of admission it is my clinical judgment that the patient will require inpatient hospital care spanning beyond 2 midnights from the point of admission due to high intensity of service, high risk for further deterioration and high frequency of surveillance required.*  Author: Jonnie Finner, DO 05/08/2022 1:16 PM  For on call review [email or web address].

## 2022-05-08 NOTE — ED Provider Notes (Signed)
[facility] DEPT Provider Note   CSN: [id number] Arrival date & time: 05/08/22  1093     History  Chief Complaint  Patient presents with   Hyperglycemia    [name] [name] is a 36 y.o. male.  Patient with history of insulin-dependent diabetes presents with hyperglycemia. He reports he was feeling unwell this morning. Has not taken his insulin for the past 6 days. Endorses nausea, vomiting but no abdominal pain or diarrhea.  He also developed sharp chest pain and mild shortness of breath. No inciting factors but he believes the pain is from being dehydrated. He reports decreased appetite and fluid intake. He was seen about a week ago in ED for similar concern of hyperglycemia.   The history is provided by the patient.  Hyperglycemia Associated symptoms: chest pain, nausea, shortness of breath and vomiting   Associated symptoms: no abdominal pain and no fever        Home Medications Prior to Admission medications   Medication Sig Start Date End Date Taking? Authorizing Provider  amLODipine (NORVASC) 10 MG tablet Take 1 tablet (10 mg total) by mouth daily. 04/30/22  Yes Isla Pence, MD  insulin aspart (NOVOLOG) 100 UNIT/ML injection Inject 15 Units into the skin 3 (three) times daily with meals. 04/30/22  Yes Isla Pence, MD  insulin glargine (LANTUS) 100 UNIT/ML injection Inject 0.5 mLs (50 Units total) into the skin at bedtime. 04/30/22 05/30/22 Yes Isla Pence, MD  lisinopril (ZESTRIL) 10 MG tablet Take 1 tablet (10 mg total) by mouth daily. 04/30/22  Yes Isla Pence, MD  chlordiazePOXIDE (LIBRIUM) 25 MG capsule 50mg  PO TID x 1D, then 25-50mg  PO BID X 1D, then 25-50mg  PO QD X 1D Patient not taking: Reported on 04/30/2022 04/09/22   Carmin Muskrat, MD  insulin NPH-regular Human (70-30) 100 UNIT/ML injection Inject 15 Units into the skin 3 (three) times daily. Patient not taking: Reported on 04/30/2022 01/23/22   Pattricia Boss, MD       Allergies    Patient has no known allergies.    Review of Systems   Review of Systems  Constitutional:  Negative for chills and fever.  Respiratory:  Positive for shortness of breath.   Cardiovascular:  Positive for chest pain.  Gastrointestinal:  Positive for nausea and vomiting. Negative for abdominal pain and diarrhea.    Physical Exam Updated Vital Signs BP (!) 124/91   Pulse (!) 102   Temp 97.6 F (36.4 C) (Oral)   Resp 16   Ht 5\' 11"  (1.803 m)   Wt 99.8 kg   SpO2 99%   BMI 30.68 kg/m  Physical Exam Constitutional:      General: He is in acute distress.     Appearance: He is not ill-appearing.  HENT:     Head: Normocephalic and atraumatic.  Cardiovascular:     Rate and Rhythm: Normal rate and regular rhythm.  Pulmonary:     Effort: Pulmonary effort is normal.     Breath sounds: Normal breath sounds.  Abdominal:     General: Abdomen is flat. There is no distension.     Palpations: Abdomen is soft.     Tenderness: There is no abdominal tenderness.  Skin:    General: Skin is warm and dry.  Neurological:     Mental Status: He is alert and oriented to person, place, and time.     ED Results / Procedures / Treatments   Labs (all labs ordered are listed, but only abnormal  results are displayed) Labs Reviewed  BASIC METABOLIC PANEL - Abnormal; Notable for the following components:      Result Value   Sodium 125 (*)    Chloride 88 (*)    Glucose, Bld 754 (*)    Creatinine, Ser 1.35 (*)    All other components within normal limits  URINALYSIS, ROUTINE W REFLEX MICROSCOPIC - Abnormal; Notable for the following components:   Color, Urine STRAW (*)    Glucose, UA >=500 (*)    Ketones, ur 5 (*)    Bacteria, UA RARE (*)    All other components within normal limits  BETA-HYDROXYBUTYRIC ACID - Abnormal; Notable for the following components:   Beta-Hydroxybutyric Acid 1.29 (*)    All other components within normal limits  CBG MONITORING, ED - Abnormal; Notable  for the following components:   Glucose-Capillary >600 (*)    All other components within normal limits  CBG MONITORING, ED - Abnormal; Notable for the following components:   Glucose-Capillary >600 (*)    All other components within normal limits  CBG MONITORING, ED - Abnormal; Notable for the following components:   Glucose-Capillary 543 (*)    All other components within normal limits  CBG MONITORING, ED - Abnormal; Notable for the following components:   Glucose-Capillary 486 (*)    All other components within normal limits  CBC WITH DIFFERENTIAL/PLATELET    EKG EKG Interpretation  Date/Time:  Wednesday May 08 2022 10:37:34 EDT Ventricular Rate:  71 PR Interval:  142 QRS Duration: 124 QT Interval:  442 QTC Calculation: 481 R Axis:   -67 Text Interpretation: Sinus rhythm Nonspecific IVCD with LAD Nonspecific T abnormalities, inferior leads Artifact Abnormal ECG Confirmed by Carmin Muskrat [phone]) on 05/08/2022 11:36:30 AM  Radiology No results found.  Procedures Procedures   Medications Ordered in ED Medications  lactated ringers infusion (0 mLs Intravenous Stopped 05/08/22 1152)  dextrose 50 % solution 0-50 mL (has no administration in time range)  insulin regular, human (MYXREDLIN) 100 units/ 100 mL infusion (9 Units/hr Intravenous Rate/Dose Change 05/08/22 1310)    ED Course/ Medical Decision Making/ A&P                           Medical Decision Making Amount and/or Complexity of Data Reviewed Labs: ordered.  Risk Prescription drug management. Decision regarding hospitalization.  Patient presents with hyperglycemia. Initial CBG shows greater than 600. Concern for DKA or HHS. Labs significant for AKI, hyponatremia and glucose of 754. Beta-hydroxybutyric 1.29. CBC unremarkable. EKG did not show any ischemic changes. He was started on IV fluids and EndoTool in ED. Potassium 4.6. Discussed with patient and he is agreeable to hospital admission.    Final  Clinical Impression(s) / ED Diagnoses Final diagnoses:  Hyperglycemia    Rx / DC Orders ED Discharge Orders     None         Angelique Blonder, DO 05/08/22 1333    Carmin Muskrat, MD 05/08/22 1659

## 2022-05-09 DIAGNOSIS — E1100 Type 2 diabetes mellitus with hyperosmolarity without nonketotic hyperglycemic-hyperosmolar coma (NKHHC): Principal | ICD-10-CM

## 2022-05-09 LAB — BASIC METABOLIC PANEL
Anion gap: 9 (ref 5–15)
BUN: 16 mg/dL (ref 6–20)
CO2: 23 mmol/L (ref 22–32)
Calcium: 8.6 mg/dL — ABNORMAL LOW (ref 8.9–10.3)
Chloride: 102 mmol/L (ref 98–111)
Creatinine, Ser: 0.79 mg/dL (ref 0.61–1.24)
GFR, Estimated: >60 mL/min (ref >60)
Glucose, Bld: 192 mg/dL — ABNORMAL HIGH (ref 70–99)
Potassium: 3.3 mmol/L — ABNORMAL LOW (ref 3.5–5.1)
Sodium: 134 mmol/L — ABNORMAL LOW (ref 135–145)

## 2022-05-09 LAB — GLUCOSE, CAPILLARY
Glucose-Capillary: 193 mg/dL — ABNORMAL HIGH (ref 70–99)
Glucose-Capillary: 210 mg/dL — ABNORMAL HIGH (ref 70–99)
Glucose-Capillary: 405 mg/dL — ABNORMAL HIGH (ref 70–99)
Glucose-Capillary: 379 mg/dL — ABNORMAL HIGH (ref 70–99)
Glucose-Capillary: 91 mg/dL (ref 70–99)

## 2022-05-09 LAB — HIV ANTIBODY (ROUTINE TESTING W REFLEX): HIV Screen 4th Generation wRfx: NONREACTIVE

## 2022-05-09 MED ORDER — INSULIN GLARGINE-YFGN 100 UNIT/ML ~~LOC~~ SOLN
30.0000 [IU] | Freq: Every day | SUBCUTANEOUS | Status: DC
  Administered 2022-05-10: 30 [IU] via SUBCUTANEOUS
  Filled 2022-05-09: qty 0.3

## 2022-05-09 MED ORDER — POTASSIUM CHLORIDE 20 MEQ PO PACK
40.0000 meq | PACK | Freq: Once | ORAL | Status: AC
  Administered 2022-05-09: 40 meq via ORAL
  Filled 2022-05-09: qty 2

## 2022-05-09 MED ORDER — INSULIN ASPART 100 UNIT/ML IJ SOLN
10.0000 [IU] | Freq: Three times a day (TID) | INTRAMUSCULAR | Status: DC
  Administered 2022-05-09 – 2022-05-10 (×2): 10 [IU] via SUBCUTANEOUS

## 2022-05-09 MED ORDER — INSULIN GLARGINE-YFGN 100 UNIT/ML ~~LOC~~ SOLN
10.0000 [IU] | Freq: Once | SUBCUTANEOUS | Status: AC
  Administered 2022-05-09: 10 [IU] via SUBCUTANEOUS
  Filled 2022-05-09: qty 0.1

## 2022-05-09 NOTE — TOC Progression Note (Signed)
Transition of Care [facility]) - Progression Note    Patient Details  Name: HOPE HOLST MRN: [id number] Date of Birth: 1986-04-29  Transition of Care [facility]) CM/SW Contact  Purcell Mouton, RN Phone Number: 05/09/2022, 9:03 AM  Clinical Narrative:    Spoke with pt concerning PCP and medications. Pt agreed with appointment with one of [facility]. Pt will be able get his medications from [facility].    Expected Discharge Plan: Home/Self Care Barriers to Discharge: No Barriers Identified  Expected Discharge Plan and Services Expected Discharge Plan: Home/Self Care In-house Referral: Financial Counselor, PCP / [facility] Discharge Planning Services: CM Consult   Living arrangements for the past 2 months: Single Family Home                                       Social Determinants of Health (SDOH) Interventions    Readmission Risk Interventions     No data to display

## 2022-05-09 NOTE — Progress Notes (Signed)
PROGRESS NOTE    [name] [name]  GGY:[id number] DOB: [birth year] DOA: 05/08/2022 PCP: Pcp, No   Brief Narrative:  This 36 years old male with PMH significant for DM 2, hypertension, depression presented in the ED with complaints of nausea and polyuria.  Patient reports he has noticed that he has been peeing a lot he has also noticed nausea and vomiting, he denies any fever, sick contacts.  He reports that he has been out of his insulin for over a week, he did not feel well so he came in the ED. Patient is admitted for hyperosmolar hyperglycemia state and started on IV hydration and IV insulin infusion as per protocol.  Assessment & Plan:   Principal Problem:   Hyperosmolar hyperglycemic state (HHS) ([place]) Active Problems:   HTN (hypertension)   AKI (acute kidney injury) ([place])   Pseudohyponatremia   DM2 (diabetes mellitus, type 2) (HCC)   Hyperosmolar hyperglycemia state: History of uncontrolled type 2 diabetes: Patient presented in the ED with uncontrolled blood glucose associated nausea, vomiting and polyuria. Labs shows blood glucose 750, UA shows ketones 20 Likely because of medication noncompliance. He ran out of insulin for about a week. Patient started on IV insulin infusion and IV hydration as per protocol. He is successfully transitioned to subcu insulin and regular insulin sliding scale. He is started on carb modified diet, Continue Semglee 30 units daily+ continue moderate sliding scale. Diabetic coordinator consulted.  Essential hypertension: Continue amlodipine, hold ACE inhibitors due to AKI  Pseudohyponatremia: Likely secondary to hyperglycemia.   Sodium corrects to 135  AKI: > Resolved Likely secondary to hyper osmolar hyper glycemic state Continue IV hydration, avoid nephrotoxic medications Avoid hypotension.  Renal ultrasound shows no obstruction.  Hypokalemia: Likely secondary to above.  Replace, continue to monitor  DVT prophylaxis: Lovenox Code  Status: Full code Family Communication: No family at bedside Disposition Plan:   Status is: Inpatient Remains inpatient appropriate because: Admitted for hyperosmolar hyperglycemic state requiring IV insulin infusion and IV hydration.  Now successfully transition to subcu insulin.  Anticipated discharge home 05/10/2022   Consultants:  None  Procedures: Renal ultrasound Antimicrobials: None  Subjective: Patient was seen and examined at bedside.  Overnight events noted.   Patient reports feeling better , denies any vomiting , patient is started on carb modified diet. Blood sugar is still fluctuating.  Objective: Vitals:   05/09/22 0420 05/09/22 0800 05/09/22 0835 05/09/22 1156  BP: (!) 142/90  133/83   Pulse: 90  (!) 103   Resp: 17  (!) 21   Temp: 98.5 F (36.9 C) 98.6 F (37 C)  99 F (37.2 C)  TempSrc: Oral Oral  Oral  SpO2: 100%  97%   Weight:      Height:        Intake/Output Summary (Last 24 hours) at 05/09/2022 1159 Last data filed at 05/09/2022 0013 Gross per 24 hour  Intake 1639.73 ml  Output 700 ml  Net 939.73 ml   Filed Weights   05/08/22 0944 05/08/22 1632  Weight: 99.8 kg 100 kg    Examination:  General exam: Appears comfortable, not in any acute distress. Respiratory system: CTA bilaterally, No wheezing, No crackles, RR 15 Cardiovascular system: S1 & S2 heard, regular rate and rhythm, no murmur. Gastrointestinal system: Abdomen is soft, non tender, non distended, BS+ Central nervous system: Alert and oriented x 3. No focal neurological deficits. Extremities: No edema, no cyanosis, no clubbing. Skin: No rashes, lesions or ulcers Psychiatry: Judgement and insight  appear normal. Mood & affect appropriate.     Data Reviewed: I have personally reviewed following labs and imaging studies  CBC: Recent Labs  Lab 05/08/22 1054  WBC 4.6  NEUTROABS 2.8  HGB 16.7  HCT 48.3  MCV 83.6  PLT 186   Basic Metabolic Panel: Recent Labs  Lab  05/08/22 1054 05/08/22 1453 05/09/22 0323  NA 125* 136 134*  K 4.6 3.5 3.3*  CL 88* 100 102  CO2 27 27 23   GLUCOSE 754* 185* 192*  BUN 16 14 16   CREATININE 1.35* 1.12 0.79  CALCIUM 9.4 9.7 8.6*  PHOS  --  3.8  --    GFR: Estimated Creatinine Clearance: 152.4 mL/min (by C-G formula based on SCr of 0.79 mg/dL). Liver Function Tests: Recent Labs  Lab 05/08/22 1453  ALBUMIN 4.1   No results for input(s): "LIPASE", "AMYLASE" in the last 168 hours. No results for input(s): "AMMONIA" in the last 168 hours. Coagulation Profile: No results for input(s): "INR", "PROTIME" in the last 168 hours. Cardiac Enzymes: No results for input(s): "CKTOTAL", "CKMB", "CKMBINDEX", "TROPONINI" in the last 168 hours. BNP (last 3 results) No results for input(s): "PROBNP" in the last 8760 hours. HbA1C: No results for input(s): "HGBA1C" in the last 72 hours. CBG: Recent Labs  Lab 05/08/22 2353 05/08/22 2358 05/09/22 0420 05/09/22 0725 05/09/22 1141  GLUCAP 331* 301* 193* 210* 405*   Lipid Profile: No results for input(s): "CHOL", "HDL", "LDLCALC", "TRIG", "CHOLHDL", "LDLDIRECT" in the last 72 hours. Thyroid Function Tests: No results for input(s): "TSH", "T4TOTAL", "FREET4", "T3FREE", "THYROIDAB" in the last 72 hours. Anemia Panel: No results for input(s): "VITAMINB12", "FOLATE", "FERRITIN", "TIBC", "IRON", "RETICCTPCT" in the last 72 hours. Sepsis Labs: No results for input(s): "PROCALCITON", "LATICACIDVEN" in the last 168 hours.  Recent Results (from the past 240 hour(s))  MRSA Next Gen by PCR, Nasal     Status: None   Collection Time: 05/08/22  4:33 PM   Specimen: Nasal Mucosa; Nasal Swab  Result Value Ref Range Status   MRSA by PCR Next Gen NOT DETECTED NOT DETECTED Final    Comment: (NOTE) The GeneXpert MRSA Assay (FDA approved for NASAL specimens only), is one component of a comprehensive MRSA colonization surveillance program. It is not intended to diagnose MRSA infection nor to  guide or monitor treatment for MRSA infections. Test performance is not FDA approved in patients less than 22 years old. Performed at [facility], [address] [place], [place]     Radiology Studies: [place] RENAL  Result Date: 05/08/2022 CLINICAL DATA:  Acute kidney injury. EXAM: RENAL / URINARY TRACT ULTRASOUND COMPLETE COMPARISON:  CT 02/12/2020 FINDINGS: Right Kidney: Renal measurements: 11 x 5.1 x 5.2 cm = volume: 154 mL. Normal parenchymal echogenicity. No hydronephrosis. No visualized stone or focal lesion. Left Kidney: Renal measurements: 10.8 x 5.1 x 5.4 cm = volume: 154 mL. Normal parenchymal echogenicity. No hydronephrosis. No visualized stone or focal lesion. Bladder: Appears normal for degree of bladder distention. Bladder volume is 300 cc. Other: None. IMPRESSION: Unremarkable sonographic appearance of the kidneys and bladder. Electronically Signed   By: 07/08/2022 M.D.   On: 05/08/2022 16:28    Scheduled Meds:  amLODipine  10 mg Oral Daily   Chlorhexidine Gluconate Cloth  6 each Topical Daily   enoxaparin (LOVENOX) injection  40 mg Subcutaneous Q24H   insulin aspart  0-15 Units Subcutaneous TID WC   insulin aspart  0-5 Units Subcutaneous QHS   insulin  glargine-yfgn  10 Units Subcutaneous Once   [START ON 05/10/2022] insulin glargine-yfgn  30 Units Subcutaneous Daily   Continuous Infusions:  dextrose 5% lactated ringers Stopped (05/09/22 0011)   lactated ringers Stopped (05/08/22 1638)   lactated ringers Stopped (05/08/22 1638)     LOS: 1 day    Time spent: 50 mins    Gracin Mcpartland, MD [facility]   If 7PM-7AM, please contact night-coverage

## 2022-05-09 NOTE — Inpatient Diabetes Management (Signed)
Inpatient Diabetes Program Recommendations  AACE/ADA: New Consensus Statement on Inpatient Glycemic Control (2015)  Target Ranges:  Prepandial:   less than 140 mg/dL      Peak postprandial:   less than 180 mg/dL (1-2 hours)      Critically ill patients:  140 - 180 mg/dL   Lab Results  Component Value Date   GLUCAP 405 (H) 05/09/2022   HGBA1C 9.8 (H) 04/06/2022    Review of Glycemic Control  Diabetes history: DM2 Outpatient Diabetes medications: Lantus 65 units QHS, Novolog 35 units TID with meals Current orders for Inpatient glycemic control: Semglee 30 QD, Novolog 0-15 TID with meals and 0-5 HS  HgbA1C - 9.8% Needs meal coverage insulin  Inpatient Diabetes Program Recommendations:    Add Novolog 10 units TID with meals if eating > 50%  Spoke with pt at bedside regarding his diabetes, what meds he takes, and why is he having trouble with getting his insulin. Pt was very sleepy and said he didn't want to talk right now about his meds. Gets insulin at [facility] and states "I'm going to get mad if I talk about it" at least 2 times.  Will try to speak with him later on.  Thank you. Lorenda Peck, RD, LDN, [place] Inpatient Diabetes Coordinator [phone]

## 2022-05-09 NOTE — TOC Progression Note (Addendum)
Transition of Care [facility]) - Progression Note    Patient Details  Name: ROMIN DIVITA MRN: [id number] Date of Birth: [birth year]  Transition of Care [facility]) CM/SW Contact  Purcell Mouton, RN Phone Number: 05/09/2022, 11:08 AM  Clinical Narrative:     Pt has an appointment for PCP at [facility] on 10/23 at 37 AM. Pt my get his medications at [facility] on [place], hours are M-F 830 AM to 5 PM. Closed on weekends. Pt is aware of appointment and medications at [facility].    Expected Discharge Plan: Home/Self Care Barriers to Discharge: No Barriers Identified  Expected Discharge Plan and Services Expected Discharge Plan: Home/Self Care In-house Referral: Financial Counselor, PCP / [facility] Connect Discharge Planning Services: CM Consult   Living arrangements for the past 2 months: Single Family Home                                       Social Determinants of Health (SDOH) Interventions    Readmission Risk Interventions     No data to display

## 2022-05-09 NOTE — Progress Notes (Signed)
Patient refusing cardiac monitoring, bp monitoring, o2 saturation. MD made aware and said it was ok to leave off, since the patient would be discharging.

## 2022-05-10 LAB — BASIC METABOLIC PANEL
Anion gap: 9 (ref 5–15)
BUN: 18 mg/dL (ref 6–20)
CO2: 23 mmol/L (ref 22–32)
Calcium: 8.7 mg/dL — ABNORMAL LOW (ref 8.9–10.3)
Chloride: 103 mmol/L (ref 98–111)
Creatinine, Ser: 0.87 mg/dL (ref 0.61–1.24)
GFR, Estimated: >60 mL/min (ref >60)
Glucose, Bld: 201 mg/dL — ABNORMAL HIGH (ref 70–99)
Potassium: 3.6 mmol/L (ref 3.5–5.1)
Sodium: 135 mmol/L (ref 135–145)

## 2022-05-10 LAB — GLUCOSE, CAPILLARY: Glucose-Capillary: 247 mg/dL — ABNORMAL HIGH (ref 70–99)

## 2022-05-10 MED ORDER — NOVOLIN 70/30 FLEXPEN (70-30) 100 UNIT/ML ~~LOC~~ SUPN: 28.0000 [IU] | PEN_INJECTOR | Freq: Two times a day (BID) | SUBCUTANEOUS | 11 refills | Status: DC

## 2022-05-10 MED ORDER — INSULIN REGULAR HUMAN 100 UNIT/ML IJ SOLN: INTRAMUSCULAR | 11 refills | Status: DC

## 2022-05-10 MED ORDER — INSULIN ISOPHANE & REGULAR (HUMAN 70-30)100 UNIT/ML KWIKPEN: PEN_INJECTOR | SUBCUTANEOUS | 11 refills | Status: DC

## 2022-05-10 NOTE — Discharge Instructions (Signed)
Advised to follow-up with primary care physician in 1 week. Advised to use insulin as prescribed.

## 2022-05-10 NOTE — TOC Progression Note (Signed)
Transition of Care [facility]) - Progression Note    Patient Details  Name: LUCCIANO VITALI MRN: [id number] Date of Birth: 02/11/1986  Transition of Care [facility]) CM/SW Contact  Leeroy Cha, RN Phone Number: 05/10/2022, 10:38 AM  Clinical Narrative:    Patient had the use of the match program earlier this month.  Per claims has had the prescriptions for insulin filled at [place] and [facility]. .  Is not elig.  For medication assistance.  Does have outpt resources and appointments in order to get a pcp and regularly prescribed insulin.  Attempted to call the girl friend no answer.    Expected Discharge Plan: Home/Self Care Barriers to Discharge: No Barriers Identified  Expected Discharge Plan and Services Expected Discharge Plan: Home/Self Care In-house Referral: Financial Counselor, PCP / Health Connect Discharge Planning Services: CM Consult   Living arrangements for the past 2 months: Single Family Home Expected Discharge Date: 05/10/22                                     Social Determinants of Health (SDOH) Interventions    Readmission Risk Interventions   No data to display

## 2022-05-10 NOTE — Discharge Summary (Signed)
Physician Discharge Summary  [name] [name]:[id number] DOB: [birth year] DOA: 05/08/2022  PCP: Pcp, No  Admit date: 05/08/2022  Discharge date: 05/10/2022  Admitted From: Home  Disposition: Home  Recommendations for Outpatient Follow-up:  Follow up with PCP in 1-2 weeks. Please obtain BMP/CBC in one week. Advised to use insulin as prescribed.  Home Health: None Equipment/Devices:None  Discharge Condition: Stable CODE STATUS:Full code Diet recommendation: Carb Modified  Brief Summary/ Hospital Course: This 36 years old male with PMH significant for DM 2, hypertension, depression presented in the ED with complaints of nausea and polyuria.  Patient reports he has noticed that he has been peeing a lot,  he has also noticed nausea and vomiting, he denies any fever, sick contacts.  He reports that he has been out of his insulin for over a week, he did not feel well so he came in the ED. Patient was admitted for hyperosmolar hyperglycemia state and started on IV hydration and IV insulin infusion as per protocol.  Patient was admitted in the ICU and continued on IV insulin infusion and IV hydration.  Blood glucose has improved. Patient started on carb modified diet.  Successfully transitioned to subcu insulin.  Patient feels better and wants to be discharged.  Patient has been noncompliant with his insulin dosing.  Compliance explained and emphasized in detail.  Patient has recently filled insulin prescription from the pharmacy.  Since he does not have insurance it would be difficult for him to get insulin.  Case management has arranged insulin for this patient.  Patient is being discharged home.  Discharge Diagnoses:  Principal Problem:   Hyperosmolar hyperglycemic state (HHS) (HCC) Active Problems:   HTN (hypertension)   AKI (acute kidney injury) (HCC)   Pseudohyponatremia   DM2 (diabetes mellitus, type 2) (HCC)  Hyperosmolar hyperglycemia state: History of uncontrolled type 2  diabetes: Patient presented in the ED with uncontrolled blood glucose associated nausea, vomiting and polyuria. Labs shows blood glucose 750, UA shows ketones 20 Likely because of medication noncompliance. He ran out of insulin for about a week. Patient started on IV insulin infusion and IV hydration as per protocol. He is successfully transitioned to subcu insulin and regular insulin sliding scale. He is started on carb modified diet, Continue Semglee 30 units daily+ continue moderate sliding scale. Diabetic coordinator consulted.   Essential hypertension: Continue amlodipine, ACEI   Pseudohyponatremia: Likely secondary to hyperglycemia.   Sodium corrects to 135   AKI: > Resolved Likely secondary to hyper osmolar hyper glycemic state Continue IV hydration, avoid nephrotoxic medications Avoid hypotension.  Renal ultrasound shows no obstruction.   Hypokalemia: Likely secondary to above.  Replaced and resolved.  Discharge Instructions  Discharge Instructions     Call MD for:  persistant dizziness or light-headedness   Complete by: As directed    Call MD for:  persistant nausea and vomiting   Complete by: As directed    Call MD for:  redness, tenderness, or signs of infection (pain, swelling, redness, odor or green/yellow discharge around incision site)   Complete by: As directed    Diet - low sodium heart healthy   Complete by: As directed    Diet Carb Modified   Complete by: As directed    Discharge instructions   Complete by: As directed    Advised to follow-up with primary care physician in 1 week. Advised to continue insulin as prescribed. Patient has recently refilled insulin prescription do not know where he lost his insulin pens  Increase activity slowly   Complete by: As directed       Allergies as of 05/10/2022   No Known Allergies      Medication List     STOP taking these medications    chlordiazePOXIDE 25 MG capsule Commonly known as: LIBRIUM    HumuLIN 70/30 (70-30) 100 UNIT/ML injection Generic drug: insulin NPH-regular Human Replaced by: NovoLIN 70/30 Kwikpen (70-30) 100 UNIT/ML KwikPen   insulin aspart 100 UNIT/ML injection Commonly known as: novoLOG   insulin glargine 100 UNIT/ML injection Commonly known as: LANTUS       TAKE these medications    amLODipine 10 MG tablet Commonly known as: NORVASC Take 1 tablet (10 mg total) by mouth daily.   insulin regular 100 units/mL injection Commonly known as: HumuLIN R As per sliding scale   lisinopril 10 MG tablet Commonly known as: ZESTRIL Take 1 tablet (10 mg total) by mouth daily.   NovoLIN 70/30 Kwikpen (70-30) 100 UNIT/ML KwikPen Generic drug: insulin isophane & regular human KwikPen Inject 28 Units into the skin 2 (two) times daily. Replaces: HumuLIN 70/30 (70-30) 100 UNIT/ML injection        Follow-up Information     [facility] Follow up.   Why: Your appointment is on 10/23 at 1040 AM. Please try to keep this appointment. Contact information: [place] [address]        [facility] Follow up.   Why: You may pick your medications up here during the week. Contact information: [place] [address] [address] [phone]               No Known Allergies  Consultations: None   Procedures/Studies: US RENAL  Result Date: 05/08/2022 CLINICAL DATA:  Acute kidney injury. EXAM: RENAL / URINARY TRACT ULTRASOUND COMPLETE COMPARISON:  CT 02/12/2020 FINDINGS: Right Kidney: Renal measurements: 11 x 5.1 x 5.2 cm = volume: 154 mL. Normal parenchymal echogenicity. No hydronephrosis. No visualized stone or focal lesion. Left Kidney: Renal measurements: 10.8 x 5.1 x 5.4 cm = volume: 154 mL. Normal parenchymal echogenicity. No hydronephrosis. No visualized stone or focal lesion. Bladder: Appears normal for degree of bladder  distention. Bladder volume is 300 cc. Other: None. IMPRESSION: Unremarkable sonographic appearance of the kidneys and bladder. Electronically Signed   By: Keith Rake M.D.   On: 05/08/2022 16:28   DG Chest Portable 1 View  Result Date: 04/30/2022 CLINICAL DATA:  Chest pain EXAM: PORTABLE CHEST 1 VIEW COMPARISON:  04/15/2022 FINDINGS: Transverse diameter of heart is slightly increased. This may be partly due to poor inspiration. Lung fields are clear of any infiltrates or pulmonary edema. There is no pleural effusion or pneumothorax. IMPRESSION: No active disease. Electronically Signed   By: Elmer Picker M.D.   On: 04/30/2022 11:58    Subjective: Patient was seen and examined at bedside.  Overnight events noted.  Patient reports feeling better, Blood sugar has improved.  Patient wants to be discharged.   Patient was explained in detail about the importance of using insulin,  he understands.  Discharge Exam: Vitals:   05/10/22 0445 05/10/22 0942  BP:  (!) 128/90  Pulse:  89  Resp:  18  Temp: 98.4 F (36.9 C) 97.7 F (36.5 C)  SpO2:  100%   Vitals:   05/09/22 2151 05/10/22 0400 05/10/22 0445 05/10/22 0942  BP: (!) 140/89 (!) 163/99  (!) 128/90  Pulse:  89  Resp:    18  Temp:  98.4 F (36.9 C) 98.4 F (36.9 C) 97.7 F (36.5 C)  TempSrc:  Oral Oral Oral  SpO2:    100%  Weight:      Height:        General: Pt is alert, awake, not in acute distress Cardiovascular: RRR, S1/S2 +, no rubs, no gallops Respiratory: CTA bilaterally, no wheezing, no rhonchi Abdominal: Soft, NT, ND, bowel sounds + Extremities: no edema, no cyanosis    The results of significant diagnostics from this hospitalization (including imaging, microbiology, ancillary and laboratory) are listed below for reference.     Microbiology: Recent Results (from the past 240 hour(s))  MRSA Next Gen by PCR, Nasal     Status: None   Collection Time: 05/08/22  4:33 PM   Specimen: Nasal Mucosa; Nasal Swab   Result Value Ref Range Status   MRSA by PCR Next Gen NOT DETECTED NOT DETECTED Final    Comment: (NOTE) The GeneXpert MRSA Assay (FDA approved for NASAL specimens only), is one component of a comprehensive MRSA colonization surveillance program. It is not intended to diagnose MRSA infection nor to guide or monitor treatment for MRSA infections. Test performance is not FDA approved in patients less than 31 years old. Performed at [facility] [address], [place], [place] [address]      Labs: BNP (last 3 results) Recent Labs    08/28/21 0936  BNP 9.0   Basic Metabolic Panel: Recent Labs  Lab 05/08/22 1054 05/08/22 1453 05/09/22 0323 05/10/22 0304  NA 125* 136 134* 135  K 4.6 3.5 3.3* 3.6  CL 88* 100 102 103  CO2 27 27 23 23   GLUCOSE 754* 185* 192* 201*  BUN 16 14 16 18   CREATININE 1.35* 1.12 0.79 0.87  CALCIUM 9.4 9.7 8.6* 8.7*  PHOS  --  3.8  --   --    Liver Function Tests: Recent Labs  Lab 05/08/22 1453  ALBUMIN 4.1   No results for input(s): "LIPASE", "AMYLASE" in the last 168 hours. No results for input(s): "AMMONIA" in the last 168 hours. CBC: Recent Labs  Lab 05/08/22 1054  WBC 4.6  NEUTROABS 2.8  HGB 16.7  HCT 48.3  MCV 83.6  PLT 186   Cardiac Enzymes: No results for input(s): "CKTOTAL", "CKMB", "CKMBINDEX", "TROPONINI" in the last 168 hours. BNP: Invalid input(s): "POCBNP" CBG: Recent Labs  Lab 05/09/22 0725 05/09/22 1141 05/09/22 1634 05/09/22 2149 05/10/22 0816  GLUCAP 210* 405* 379* 91 247*   D-Dimer No results for input(s): "DDIMER" in the last 72 hours. Hgb A1c No results for input(s): "HGBA1C" in the last 72 hours. Lipid Profile No results for input(s): "CHOL", "HDL", "LDLCALC", "TRIG", "CHOLHDL", "LDLDIRECT" in the last 72 hours. Thyroid function studies No results for input(s): "TSH", "T4TOTAL", "T3FREE", "THYROIDAB" in the last 72 hours.  Invalid input(s): "FREET3" Anemia work up No results for  input(s): "VITAMINB12", "FOLATE", "FERRITIN", "TIBC", "IRON", "RETICCTPCT" in the last 72 hours. Urinalysis    Component Value Date/Time   COLORURINE STRAW (A) 05/08/2022 1040   APPEARANCEUR CLEAR 05/08/2022 1040   LABSPEC 1.028 05/08/2022 1040   PHURINE 5.0 05/08/2022 1040   GLUCOSEU >=500 (A) 05/08/2022 1040   HGBUR NEGATIVE 05/08/2022 1040   BILIRUBINUR NEGATIVE 05/08/2022 1040   KETONESUR 5 (A) 05/08/2022 1040   PROTEINUR NEGATIVE 05/08/2022 1040   NITRITE NEGATIVE 05/08/2022 1040   LEUKOCYTESUR NEGATIVE 05/08/2022 1040   Sepsis Labs Recent Labs  Lab 05/08/22 1054  WBC 4.6   Microbiology Recent Results (from the past 240 hour(s))  MRSA Next Gen by PCR, Nasal     Status: None   Collection Time: 05/08/22  4:33 PM   Specimen: Nasal Mucosa; Nasal Swab  Result Value Ref Range Status   MRSA by PCR Next Gen NOT DETECTED NOT DETECTED Final    Comment: (NOTE) The GeneXpert MRSA Assay (FDA approved for NASAL specimens only), is one component of a comprehensive MRSA colonization surveillance program. It is not intended to diagnose MRSA infection nor to guide or monitor treatment for MRSA infections. Test performance is not FDA approved in patients less than 25 years old. Performed at [facility], [address], [place], [place] [address]      Time coordinating discharge: Over 30 minutes  SIGNED:   Cipriano Bunker, MD  [facility] 05/10/2022, 2:24 PM Pager   If 7PM-7AM, please contact night-coverage

## 2022-05-10 NOTE — Progress Notes (Signed)
Received call from lobby reception that patient was in lobby without a ride home, this patient requested at 12 noon to be discharged immediately because his cousin would be here in 5 minutes, patient in lobby stating that he does not have a ride home, spoke with Case Manager and she will go give him a bus pass.

## 2022-05-10 NOTE — Progress Notes (Signed)
Discharge instructions discussed with patient, medication was called into [facility] in [place], he stated that he wished this had been called into the [facility] on [address].  This nurse offered to check with the doctor to see if this could be changed, patient did not want to wait, he stated that he would call [facility] himself.

## 2022-05-13 NOTE — Progress Notes (Signed)
Erroneous encounter-disregard

## 2022-05-16 ENCOUNTER — Emergency Department (HOSPITAL_COMMUNITY): Payer: No Typology Code available for payment source

## 2022-05-16 ENCOUNTER — Encounter (HOSPITAL_COMMUNITY): Payer: Self-pay

## 2022-05-16 ENCOUNTER — Other Ambulatory Visit: Payer: Self-pay

## 2022-05-16 ENCOUNTER — Inpatient Hospital Stay (HOSPITAL_COMMUNITY)
Admission: EM | Admit: 2022-05-16 | Discharge: 2022-05-21 | DRG: 638 | Disposition: A | Discharge status: Home / Self Care | Payer: No Typology Code available for payment source | Attending: Internal Medicine | Admitting: Internal Medicine

## 2022-05-16 DIAGNOSIS — F17210 Nicotine dependence, cigarettes, uncomplicated: Secondary | ICD-10-CM | POA: Diagnosis present

## 2022-05-16 DIAGNOSIS — N179 Acute kidney failure, unspecified: Secondary | ICD-10-CM | POA: Diagnosis not present

## 2022-05-16 DIAGNOSIS — Z794 Long term (current) use of insulin: Secondary | ICD-10-CM

## 2022-05-16 DIAGNOSIS — E1100 Type 2 diabetes mellitus with hyperosmolarity without nonketotic hyperglycemic-hyperosmolar coma (NKHHC): Secondary | ICD-10-CM | POA: Diagnosis not present

## 2022-05-16 DIAGNOSIS — I10 Essential (primary) hypertension: Secondary | ICD-10-CM | POA: Diagnosis present

## 2022-05-16 DIAGNOSIS — I152 Hypertension secondary to endocrine disorders: Secondary | ICD-10-CM | POA: Diagnosis present

## 2022-05-16 DIAGNOSIS — Z6830 Body mass index (BMI) 30.0-30.9, adult: Secondary | ICD-10-CM

## 2022-05-16 DIAGNOSIS — E1159 Type 2 diabetes mellitus with other circulatory complications: Secondary | ICD-10-CM | POA: Diagnosis present

## 2022-05-16 DIAGNOSIS — B9561 Methicillin susceptible Staphylococcus aureus infection as the cause of diseases classified elsewhere: Secondary | ICD-10-CM | POA: Diagnosis present

## 2022-05-16 DIAGNOSIS — E119 Type 2 diabetes mellitus without complications: Secondary | ICD-10-CM

## 2022-05-16 DIAGNOSIS — R7881 Bacteremia: Secondary | ICD-10-CM | POA: Diagnosis present

## 2022-05-16 DIAGNOSIS — E669 Obesity, unspecified: Secondary | ICD-10-CM | POA: Diagnosis present

## 2022-05-16 DIAGNOSIS — Z91119 Patient's noncompliance with dietary regimen due to unspecified reason: Secondary | ICD-10-CM

## 2022-05-16 DIAGNOSIS — Z79899 Other long term (current) drug therapy: Secondary | ICD-10-CM

## 2022-05-16 LAB — LACTIC ACID, PLASMA: Lactic Acid, Venous: 1.8 mmol/L (ref 0.5–1.9)

## 2022-05-16 LAB — I-STAT CHEM 8, ED
BUN: 18 mg/dL (ref 6–20)
Calcium, Ion: 1.12 mmol/L — ABNORMAL LOW (ref 1.15–1.40)
Chloride: 89 mmol/L — ABNORMAL LOW (ref 98–111)
Creatinine, Ser: 1.4 mg/dL — ABNORMAL HIGH (ref 0.61–1.24)
Glucose, Bld: >700 mg/dL (ref 70–99)
HCT: 46 % (ref 39.0–52.0)
Hemoglobin: 15.6 g/dL (ref 13.0–17.0)
Potassium: 4.6 mmol/L (ref 3.5–5.1)
Sodium: 127 mmol/L — ABNORMAL LOW (ref 135–145)
TCO2: 27 mmol/L (ref 22–32)

## 2022-05-16 LAB — CBC WITH DIFFERENTIAL/PLATELET
Abs Immature Granulocytes: 0.01 10*3/uL (ref 0.00–0.07)
Basophils Absolute: 0 10*3/uL (ref 0.0–0.1)
Basophils Relative: 1 %
Eosinophils Absolute: 0.1 10*3/uL (ref 0.0–0.5)
Eosinophils Relative: 1 %
HCT: 44.1 % (ref 39.0–52.0)
Hemoglobin: 15.2 g/dL (ref 13.0–17.0)
Immature Granulocytes: 0 %
Lymphocytes Relative: 36 %
Lymphs Abs: 1.9 10*3/uL (ref 0.7–4.0)
MCH: 28 pg (ref 26.0–34.0)
MCHC: 34.5 g/dL (ref 30.0–36.0)
MCV: 81.4 fL (ref 80.0–100.0)
Monocytes Absolute: 0.5 10*3/uL (ref 0.1–1.0)
Monocytes Relative: 10 %
Neutro Abs: 2.8 10*3/uL (ref 1.7–7.7)
Neutrophils Relative %: 52 %
Platelets: 241 10*3/uL (ref 150–400)
RBC: 5.42 MIL/uL (ref 4.22–5.81)
RDW: 12.3 % (ref 11.5–15.5)
WBC: 5.3 10*3/uL (ref 4.0–10.5)
nRBC: 0 % (ref 0.0–0.2)

## 2022-05-16 LAB — BLOOD GAS, VENOUS
Acid-Base Excess: 4.3 mmol/L — ABNORMAL HIGH (ref 0.0–2.0)
Bicarbonate: 29.8 mmol/L — ABNORMAL HIGH (ref 20.0–28.0)
O2 Saturation: 91.2 %
Patient temperature: 37
pCO2, Ven: 47 mmHg (ref 44–60)
pH, Ven: 7.41 (ref 7.25–7.43)
pO2, Ven: 61 mmHg — ABNORMAL HIGH (ref 32–45)

## 2022-05-16 LAB — COMPREHENSIVE METABOLIC PANEL
ALT: 15 U/L (ref 0–44)
AST: 16 U/L (ref 15–41)
Albumin: 3.9 g/dL (ref 3.5–5.0)
Alkaline Phosphatase: 70 U/L (ref 38–126)
Anion gap: 13 (ref 5–15)
BUN: 18 mg/dL (ref 6–20)
CO2: 26 mmol/L (ref 22–32)
Calcium: 9.2 mg/dL (ref 8.9–10.3)
Chloride: 89 mmol/L — ABNORMAL LOW (ref 98–111)
Creatinine, Ser: 1.56 mg/dL — ABNORMAL HIGH (ref 0.61–1.24)
GFR, Estimated: 58 mL/min — ABNORMAL LOW (ref >60)
Glucose, Bld: 797 mg/dL (ref 70–99)
Potassium: 4.6 mmol/L (ref 3.5–5.1)
Sodium: 128 mmol/L — ABNORMAL LOW (ref 135–145)
Total Bilirubin: 1.4 mg/dL — ABNORMAL HIGH (ref 0.3–1.2)
Total Protein: 7.2 g/dL (ref 6.5–8.1)

## 2022-05-16 LAB — ETHANOL: Alcohol, Ethyl (B): <10 mg/dL (ref <10)

## 2022-05-16 LAB — CBG MONITORING, ED: Glucose-Capillary: >600 mg/dL (ref 70–99)

## 2022-05-16 LAB — BETA-HYDROXYBUTYRIC ACID: Beta-Hydroxybutyric Acid: 1 mmol/L — ABNORMAL HIGH (ref 0.05–0.27)

## 2022-05-16 LAB — TROPONIN I (HIGH SENSITIVITY): Troponin I (High Sensitivity): 7 ng/L (ref <18)

## 2022-05-16 MED ORDER — DEXTROSE 50 % IV SOLN: 0.0000 mL | INTRAVENOUS | Status: DC | PRN

## 2022-05-16 MED ORDER — POTASSIUM CHLORIDE 10 MEQ/100ML IV SOLN
10.0000 meq | INTRAVENOUS | Status: AC
  Administered 2022-05-17: 10 meq via INTRAVENOUS
  Filled 2022-05-16: qty 100

## 2022-05-16 MED ORDER — LACTATED RINGERS IV SOLN: INTRAVENOUS | Status: DC

## 2022-05-16 MED ORDER — LACTATED RINGERS IV BOLUS
20.0000 mL/kg | Freq: Once | INTRAVENOUS | Status: AC
  Administered 2022-05-16: 2000 mL via INTRAVENOUS

## 2022-05-16 MED ORDER — INSULIN REGULAR(HUMAN) IN NACL 100-0.9 UT/100ML-% IV SOLN
INTRAVENOUS | Status: DC
  Administered 2022-05-17: 13 [IU]/h via INTRAVENOUS
  Filled 2022-05-16: qty 100

## 2022-05-16 MED ORDER — ENOXAPARIN SODIUM 40 MG/0.4ML IJ SOSY
40.0000 mg | PREFILLED_SYRINGE | INTRAMUSCULAR | Status: DC
  Administered 2022-05-17 – 2022-05-19 (×3): 40 mg via SUBCUTANEOUS
  Filled 2022-05-16 (×5): qty 0.4

## 2022-05-16 MED ORDER — DEXTROSE IN LACTATED RINGERS 5 % IV SOLN: INTRAVENOUS | Status: DC

## 2022-05-16 NOTE — H&P (Signed)
History and Physical   [name] [name] OAC:[id number] DOB: 18-Aug-1985 DOA: 05/16/2022  PCP: Pcp, No   Patient coming from: Home  Chief Complaint: Body aches, headaches, concern for hyperglycemia  HPI: [name] [name] is a 36 y.o. male with medical history significant of diabetes with history of DKA and HHS, hypertension presenting with concern for hyperglycemia.  Patient presenting with ongoing body aches and headaches as well as concern for hyperglycemia.  States this is similar to his previous presentation where he had elevated blood sugars.  He has a recurrent issue with poor diabetes control and admissions for DKA/HHS.  Last noted to have HHS on the 13th of this month.  He states that he is taking his insulin but unsure of his dose.  Also reporting increased lethargy similar to previous.  He denies fevers, chills, chest pain, shortness of breath, abdominal pain, constipation, diarrhea, nausea, vomiting.  ED Course: Signs in ED significant for heart rate in the 100s, blood pressure in the 140 systolic.  Lab work-up included CMP with sodium 128 which corrects considering glucose of 797, chloride 89, creatinine elevated 1.56 and baseline of 0.9, T. bili 1.4.  CBC within normal limits.  Lactic acid normal.  Troponin normal with repeat pending.  Urinalysis, UDS, blood cultures pending.  Beta hydroxybutyric acid elevated to 1.0.  Ethanol level negative.  VBG with normal pH and normal PCO2.  Chest x-ray showed hypoventilatory changes but no acute abnormality.  Patient started on insulin drip and IV fluids in the ED.  Review of Systems: As per HPI otherwise all other systems reviewed and are negative.  Past Medical History:  Diagnosis Date   Diabetes mellitus without complication (HCC)    DKA (diabetic ketoacidosis) (HCC) 05/08/2022   Hyperosmolar hyperglycemic state (HHS) (HCC) 05/08/2022   Hypoglycemia 07/02/2019   Pseudohyponatremia 05/08/2022    Past Surgical History:  Procedure  Laterality Date   HAND SURGERY      Social History  reports that he has been smoking cigarettes. He has a 5.00 pack-year smoking history. He has never used smokeless tobacco. He reports current alcohol use. He reports that he does not use drugs.  No Known Allergies  Family History  Family history unknown: Yes  Reviewed on admission  Prior to Admission medications   Medication Sig Start Date End Date Taking? Authorizing Provider  amLODipine (NORVASC) 10 MG tablet Take 1 tablet (10 mg total) by mouth daily. 04/30/22  Yes Jacalyn Lefevre, MD  insulin isophane & regular human KwikPen (NOVOLIN 70/30 KWIKPEN) (70-30) 100 UNIT/ML KwikPen Inject 28 Units into the skin 2 (two) times daily. 05/10/22 07/09/22 Yes Cipriano Bunker, MD  insulin regular (HUMULIN R) 100 units/mL injection As per sliding scale 05/10/22  Yes Cipriano Bunker, MD  lisinopril (ZESTRIL) 10 MG tablet Take 1 tablet (10 mg total) by mouth daily. 04/30/22  Yes Jacalyn Lefevre, MD    Physical Exam: Vitals:   05/16/22 2059 05/16/22 2107  BP: (!) 143/93   Pulse: (!) 109   Resp: 17   Temp: 98.1 F (36.7 C)   TempSrc: Oral   SpO2: 95%   Weight:  100 kg  Height:  5\' 11"  (1.803 m)    Physical Exam Constitutional:      General: He is not in acute distress.    Appearance: Normal appearance.     Comments: Drowsy but arousable  HENT:     Head: Normocephalic and atraumatic.     Mouth/Throat:     Mouth: Mucous membranes are  moist.     Pharynx: Oropharynx is clear.  Eyes:     Extraocular Movements: Extraocular movements intact.     Pupils: Pupils are equal, round, and reactive to light.  Cardiovascular:     Rate and Rhythm: Regular rhythm. Tachycardia present.     Pulses: Normal pulses.     Heart sounds: Normal heart sounds.  Pulmonary:     Effort: Pulmonary effort is normal. No respiratory distress.     Breath sounds: Normal breath sounds.  Abdominal:     General: Bowel sounds are normal. There is no distension.      Palpations: Abdomen is soft.     Tenderness: There is no abdominal tenderness.  Musculoskeletal:        General: No swelling or deformity.  Skin:    General: Skin is warm and dry.  Neurological:     General: No focal deficit present.     Mental Status: Mental status is at baseline.    Labs on Admission: I have personally reviewed following labs and imaging studies  CBC: Recent Labs  Lab 05/16/22 2126 05/16/22 2221  WBC 5.3  --   NEUTROABS 2.8  --   HGB 15.2 15.6  HCT 44.1 46.0  MCV 81.4  --   PLT 241  --     Basic Metabolic Panel: Recent Labs  Lab 05/10/22 0304 05/16/22 2126 05/16/22 2221  NA 135 128* 127*  K 3.6 4.6 4.6  CL 103 89* 89*  CO2 23 26  --   GLUCOSE 201* 797* >700*  BUN 18 18 18   CREATININE 0.87 1.56* 1.40*  CALCIUM 8.7* 9.2  --     GFR: Estimated Creatinine Clearance: 87.1 mL/min (A) (by C-G formula based on SCr of 1.4 mg/dL (H)).  Liver Function Tests: Recent Labs  Lab 05/16/22 2126  AST 16  ALT 15  ALKPHOS 70  BILITOT 1.4*  PROT 7.2  ALBUMIN 3.9    Urine analysis:    Component Value Date/Time   COLORURINE STRAW (A) 05/08/2022 1040   APPEARANCEUR CLEAR 05/08/2022 1040   LABSPEC 1.028 05/08/2022 1040   PHURINE 5.0 05/08/2022 1040   GLUCOSEU >=500 (A) 05/08/2022 1040   HGBUR NEGATIVE 05/08/2022 1040   BILIRUBINUR NEGATIVE 05/08/2022 1040   KETONESUR 5 (A) 05/08/2022 1040   PROTEINUR NEGATIVE 05/08/2022 1040   NITRITE NEGATIVE 05/08/2022 1040   LEUKOCYTESUR NEGATIVE 05/08/2022 1040    Radiological Exams on Admission: DG Chest Port 1 View  Result Date: 05/16/2022 CLINICAL DATA:  Chest pain. EXAM: PORTABLE CHEST 1 VIEW COMPARISON:  Most recent radiograph 04/30/2022. Most recent CT 04/05/2022 FINDINGS: Lung volumes are low.The cardiomediastinal contours are normal. The lungs are clear. Pulmonary vasculature is normal. No consolidation, pleural effusion, or pneumothorax. No acute osseous abnormalities are seen. IMPRESSION:  Hypoventilatory chest without acute abnormality. Electronically Signed   By: 06/05/2022 M.D.   On: 05/16/2022 21:49    EKG: Independently reviewed.  Sinus tachycardia at 101 bpm.  Nonspecific intraventricular conduction today with QRS of 118.  Assessment/Plan Active Problems:   HTN (hypertension)   AKI (acute kidney injury) (HCC)   DM2 (diabetes mellitus, type 2) (HCC)   Hyperosmolar hyperglycemic state (HHS) (HCC)   Diabetes HHS > Known history of poorly controlled diabetes with recurrent admissions for HHS/DKA.  This time presented with body aches headaches and lethargy and concern for sensations similar to previous elevated blood sugars. > Found to have glucose elevated to 797 in the ED.  Mild elevated beta hydroxybutyric  acid 1.  But normal CO2, anion gap, VBG. > Received 2 L of fluids and started on insulin drip in the ED. -Monitor in stepdown unit Continue insulin drip - potassium supplementation with 20 mEq IV potassium - LR at 125 mL/hr until CBG less than 250 - Switch to D5-LR when 1 CBG less than 250 - Nothing by mouth  - BMET every 4 hours - CBG Q1H - Once glucose levels normalize, start home insulin, Semglee 30 units daily - Continue insulin drip for 1-2 more hours, then discontinue and start SSI-S  - DC fluids if eating, drinking, and off insulin drip - Consult diabetic educator, patient continues to have poor control and insight  Hypertension - Continue amlodipine - Holding home lisinopril in the setting of AKI  AKI > Creatinine elevated to 1.56 and baseline 0.9 in the setting of HHS as above. > Received 2 L in the ED. - Continue IV fluids as above - Holding nephrotoxic agents - Trend renal function and electrolytes  DVT prophylaxis: Lovenox Code Status:   Full Family Communication:  None on admission. Disposition Plan:   Patient is from:  Home  Anticipated DC to:  Home  Anticipated DC date:  1 to 2 days  Anticipated DC barriers: None  Consults  called:  None Admission status:  Observation, progressive  Severity of Illness: The appropriate patient status for this patient is OBSERVATION. Observation status is judged to be reasonable and necessary in order to provide the required intensity of service to ensure the patient's safety. The patient's presenting symptoms, physical exam findings, and initial radiographic and laboratory data in the context of their medical condition is felt to place them at decreased risk for further clinical deterioration. Furthermore, it is anticipated that the patient will be medically stable for discharge from the hospital within 2 midnights of admission.    Marcelyn Bruins MD [facility]  How to contact the [facility] Attending or Consulting provider [place] or covering provider during after hours [place], for this patient?   Check the care team in [facility] and look for a) attending/consulting [facility] provider listed and b) the [facility] team listed Log into [email or web address] and use [place]'s universal password to access. If you do not have the password, please contact the hospital operator. Locate the [facility] provider you are looking for under [facility] and page to a number that you can be directly reached. If you still have difficulty reaching the provider, please page the [facility] (Director on Call) for the Hospitalists listed on amion for assistance.  05/16/2022, 11:28 PM

## 2022-05-16 NOTE — ED Notes (Signed)
Per Dr. Langston Masker could hold off on blood cultures if afebrile

## 2022-05-16 NOTE — ED Provider Triage Note (Signed)
Emergency Medicine Provider Triage Evaluation Note  [name] [name] , a 36 y.o. male  was evaluated in triage.  Pt complains of altered mental status and chest pain.  States he took 35 units of insulin before dinner tonight.  Felt lightheaded and passed out shortly after dinner.  EMS was called because family believed his behavior was unusual.  Blood glucose in route was 500.  Patient also endorses chest pain.  Located in the center of his chest is nonradiating.  Chest pain started this afternoon.  Endorses associated shortness of breath.   Review of Systems  Positive: See above Negative: Se above  Physical Exam  BP (!) 143/93   Pulse (!) 109   Temp 98.1 F (36.7 C) (Oral)   Resp 17   Ht 5\' 11"  (1.803 m)   Wt 100 kg   SpO2 95%   BMI 30.75 kg/m  Gen:   Awake, somnolent Resp:  Normal effort  MSK:   Moves extremities without difficulty  Other:    Medical Decision Making  Medically screening exam initiated at 9:09 PM.  Appropriate orders placed.  [name] [name] was informed that the remainder of the evaluation will be completed by another provider, this initial triage assessment does not replace that evaluation, and the importance of remaining in the ED until their evaluation is complete.  Work up initiated   [name] [name], [name] [name] 05/16/22 2117

## 2022-05-16 NOTE — ED Provider Notes (Signed)
[facility] DEPT Provider Note   CSN: [id number] Arrival date & time: 05/16/22  2048     History  CC: High blood sugar   [name] [name] is a 36 y.o. male presenting to emergency department with chest pain and hyperglycemia.  Patient arrived with complaint of body aches, headaches, concerned his blood sugar was high.  Per medical record review this is a recurring issue for him and he has had frequent ED visits for hyperglycemia.  He claims that he is taking insulin, but cannot tell me how many units he is taking.  He denies alcohol or drug use.  His most recent hospitalization was discharged October 13, after he was admitted for nausea and hyperglycemia.  He received IV hydration and insulin in the emergency department.  He was diagnosed at that time with hyperosmolar hyperglycemic state.  He was started on a carb modified diet and continued on Semglee 30 units daily  HPI     Home Medications Prior to Admission medications   Medication Sig Start Date End Date Taking? Authorizing Provider  amLODipine (NORVASC) 10 MG tablet Take 1 tablet (10 mg total) by mouth daily. 04/30/22  Yes Isla Pence, MD  insulin isophane & regular human KwikPen (NOVOLIN 70/30 KWIKPEN) (70-30) 100 UNIT/ML KwikPen Inject 28 Units into the skin 2 (two) times daily. 05/10/22 07/09/22 Yes Shawna Clamp, MD  insulin regular (HUMULIN R) 100 units/mL injection As per sliding scale 05/10/22  Yes Shawna Clamp, MD  lisinopril (ZESTRIL) 10 MG tablet Take 1 tablet (10 mg total) by mouth daily. 04/30/22  Yes Isla Pence, MD      Allergies    Patient has no known allergies.    Review of Systems   Review of Systems  Physical Exam Updated Vital Signs BP (!) 143/93   Pulse (!) 109   Temp 98.1 F (36.7 C) (Oral)   Resp 17   Ht 5\' 11"  (1.803 m)   Wt 100 kg   SpO2 95%   BMI 30.75 kg/m  Physical Exam Constitutional:      General: He is not in acute distress.    Comments:  Injected sclera bilaterally, patient appears lethargic, woozy  HENT:     Head: Normocephalic and atraumatic.  Eyes:     Conjunctiva/sclera: Conjunctivae normal.     Pupils: Pupils are equal, round, and reactive to light.  Cardiovascular:     Rate and Rhythm: Regular rhythm. Tachycardia present.  Pulmonary:     Effort: Pulmonary effort is normal. No respiratory distress.  Abdominal:     General: There is no distension.     Tenderness: There is no abdominal tenderness.  Skin:    General: Skin is warm and dry.  Neurological:     General: No focal deficit present.     Mental Status: Mental status is at baseline.  Psychiatric:        Mood and Affect: Mood normal.        Behavior: Behavior normal.     ED Results / Procedures / Treatments   Labs (all labs ordered are listed, but only abnormal results are displayed) Labs Reviewed  COMPREHENSIVE METABOLIC PANEL - Abnormal; Notable for the following components:      Result Value   Sodium 128 (*)    Chloride 89 (*)    Glucose, Bld 797 (*)    Creatinine, Ser 1.56 (*)    Total Bilirubin 1.4 (*)    GFR, Estimated 58 (*)    All  other components within normal limits  BETA-HYDROXYBUTYRIC ACID - Abnormal; Notable for the following components:   Beta-Hydroxybutyric Acid 1.00 (*)    All other components within normal limits  BLOOD GAS, VENOUS - Abnormal; Notable for the following components:   pO2, Ven 61 (*)    Bicarbonate 29.8 (*)    Acid-Base Excess 4.3 (*)    All other components within normal limits  I-STAT CHEM 8, ED - Abnormal; Notable for the following components:   Sodium 127 (*)    Chloride 89 (*)    Creatinine, Ser 1.40 (*)    Glucose, Bld >700 (*)    Calcium, Ion 1.12 (*)    All other components within normal limits  CBG MONITORING, ED - Abnormal; Notable for the following components:   Glucose-Capillary >600 (*)    All other components within normal limits  CULTURE, BLOOD (ROUTINE X 2)  CULTURE, BLOOD (ROUTINE X 2)   LACTIC ACID, PLASMA  CBC WITH DIFFERENTIAL/PLATELET  ETHANOL  URINALYSIS, ROUTINE W REFLEX MICROSCOPIC  RAPID URINE DRUG SCREEN, HOSP PERFORMED  OSMOLALITY  BASIC METABOLIC PANEL  BASIC METABOLIC PANEL  BASIC METABOLIC PANEL  I-STAT VENOUS BLOOD GAS, ED  CBG MONITORING, ED  TROPONIN I (HIGH SENSITIVITY)  TROPONIN I (HIGH SENSITIVITY)    EKG EKG Interpretation  Date/Time:  Thursday May 16 2022 21:18:58 EDT Ventricular Rate:  101 PR Interval:  130 QRS Duration: 118 QT Interval:  416 QTC Calculation: 540 R Axis:   -42 Text Interpretation: Sinus tachycardia Incomplete RBBB and LAFB Confirmed by Octaviano Glow ([id number]) on 05/16/2022 10:12:53 PM  Radiology DG Chest Port 1 View  Result Date: 05/16/2022 CLINICAL DATA:  Chest pain. EXAM: PORTABLE CHEST 1 VIEW COMPARISON:  Most recent radiograph 04/30/2022. Most recent CT 04/05/2022 FINDINGS: Lung volumes are low.The cardiomediastinal contours are normal. The lungs are clear. Pulmonary vasculature is normal. No consolidation, pleural effusion, or pneumothorax. No acute osseous abnormalities are seen. IMPRESSION: Hypoventilatory chest without acute abnormality. Electronically Signed   By: Keith Rake M.D.   On: 05/16/2022 21:49    Procedures .Critical Care  Performed by: Wyvonnia Dusky, MD Authorized by: Wyvonnia Dusky, MD   Critical care provider statement:    Critical care time (minutes):  45   Critical care time was exclusive of:  Separately billable procedures and treating other patients   Critical care was necessary to treat or prevent imminent or life-threatening deterioration of the following conditions:  Metabolic crisis   Critical care was time spent personally by me on the following activities:  Ordering and performing treatments and interventions, ordering and review of laboratory studies, ordering and review of radiographic studies, pulse oximetry, review of old charts, examination of patient and evaluation  of patient's response to treatment   Care discussed with: admitting provider   Comments:     HHS insulin management, fluid management     Medications Ordered in ED Medications  insulin regular, human (MYXREDLIN) 100 units/ 100 mL infusion (has no administration in time range)  dextrose 5 % in lactated ringers infusion (has no administration in time range)  dextrose 50 % solution 0-50 mL (has no administration in time range)  enoxaparin (LOVENOX) injection 40 mg (has no administration in time range)  lactated ringers infusion (has no administration in time range)  potassium chloride 10 mEq in 100 mL IVPB (has no administration in time range)  lactated ringers bolus 2,000 mL (2,000 mLs Intravenous New Bag/Given 05/16/22 2133)    ED Course/ Medical  Decision Making/ A&P Clinical Course as of 05/16/22 2321  Thu May 16, 2022  2216 Patient is hyperglycemic with likely pseudohyponatremia -corrected sodium 139 [MT]  2217 No acidosis or elevated anion gap [MT]  2218 Troponin I (High Sensitivity): 7 [MT]  2320 Admitted to hospitalist DR Trilby Drummer [MT]    Clinical Course User Index [MT] Wyvonnia Dusky, MD                           Medical Decision Making Amount and/or Complexity of Data Reviewed Labs: ordered. Decision-making details documented in ED Course.  Risk Prescription drug management. Decision regarding hospitalization.   This patient presents to the ED with concern for confusion, lethargy, hyperglycemia. This involves an extensive number of treatment options, and is a complaint that carries with it a high risk of complications and morbidity.  The differential diagnosis includes HHS versus DKA versus metabolic encephalopathy versus polysubstance use or intoxication versus other  Co-morbidities that complicate the patient evaluation: History of type 2 diabetes on insulin with frequent noncompliance issues and hospitalizations for glycemic crisis  External records from outside  source obtained and reviewed including most recent hospital discharge summary from 1 week ago  I ordered and personally interpreted labs.  The pertinent results include: Significant hyperglycemia with no anion gap, no acidosis.  No clear nidus of infection  I ordered imaging studies including x-ray of the chest I independently visualized and interpreted imaging which showed no pneumonia or pneumothorax I agree with the radiologist interpretation  The patient was maintained on a cardiac monitor.  I personally viewed and interpreted the cardiac monitored which showed an underlying rhythm of: Sinus rhythm and sinus tachycardia  Per my interpretation the patient's ECG shows no acute ischemic changes, no significant changes from prior tracing.  Chronic T wave inversions noted in inferior and lateral leads, may be physiological for age  I ordered medication including IV fluids, IV insulin for dehydration and hyperglycemia  I have reviewed the patients home medicines and have made adjustments as needed  Test Considered: Lower suspicion for acute stroke or CVA requiring emergent LP or CT imaging of the brain at this time  After the interventions noted above, I reevaluated the patient and found that they have: stayed the same  Social Determinants of Health:Patient counseled on importance of insulin adherence.  There is no indication for intubation at this time.  His mental status is improving here.  His respiratory status is stable.  Dispostion:  After consideration of the diagnostic results and the patients response to treatment, I feel that the patent would benefit from medical admission.         Final Clinical Impression(s) / ED Diagnoses Final diagnoses:  Hyperosmolar hyperglycemic state (HHS) [facility])    Rx / DC Orders ED Discharge Orders     None         Wyvonnia Dusky, MD 05/16/22 2322

## 2022-05-16 NOTE — ED Triage Notes (Signed)
Pt bib ems c/o chest pain and elevated bgl. Per ems pt was uncoperative for them and wouldn't answer any questions.

## 2022-05-17 ENCOUNTER — Other Ambulatory Visit (HOSPITAL_COMMUNITY): Payer: Self-pay

## 2022-05-17 DIAGNOSIS — Z794 Long term (current) use of insulin: Secondary | ICD-10-CM | POA: Diagnosis not present

## 2022-05-17 DIAGNOSIS — E119 Type 2 diabetes mellitus without complications: Secondary | ICD-10-CM | POA: Diagnosis not present

## 2022-05-17 LAB — URINALYSIS, ROUTINE W REFLEX MICROSCOPIC
Bacteria, UA: NONE SEEN
Bilirubin Urine: NEGATIVE
Glucose, UA: >=500 mg/dL — AB
Hgb urine dipstick: NEGATIVE
Ketones, ur: 20 mg/dL — AB
Leukocytes,Ua: NEGATIVE
Nitrite: NEGATIVE
Protein, ur: NEGATIVE mg/dL
Specific Gravity, Urine: 1.027 (ref 1.005–1.030)
pH: 7 (ref 5.0–8.0)

## 2022-05-17 LAB — BLOOD CULTURE ID PANEL (REFLEXED) - BCID2

## 2022-05-17 LAB — BASIC METABOLIC PANEL
Anion gap: 9 (ref 5–15)
Anion gap: 7 (ref 5–15)
Anion gap: 9 (ref 5–15)
BUN: 18 mg/dL (ref 6–20)
BUN: 18 mg/dL (ref 6–20)
BUN: 17 mg/dL (ref 6–20)
CO2: 26 mmol/L (ref 22–32)
CO2: 26 mmol/L (ref 22–32)
CO2: 22 mmol/L (ref 22–32)
Calcium: 8.7 mg/dL — ABNORMAL LOW (ref 8.9–10.3)
Calcium: 9.3 mg/dL (ref 8.9–10.3)
Calcium: 8.4 mg/dL — ABNORMAL LOW (ref 8.9–10.3)
Chloride: 94 mmol/L — ABNORMAL LOW (ref 98–111)
Chloride: 103 mmol/L (ref 98–111)
Chloride: 98 mmol/L (ref 98–111)
Creatinine, Ser: 1.42 mg/dL — ABNORMAL HIGH (ref 0.61–1.24)
Creatinine, Ser: 1.05 mg/dL (ref 0.61–1.24)
Creatinine, Ser: 1.02 mg/dL (ref 0.61–1.24)
GFR, Estimated: >60 mL/min (ref >60)
GFR, Estimated: >60 mL/min (ref >60)
GFR, Estimated: >60 mL/min (ref >60)
Glucose, Bld: 624 mg/dL (ref 70–99)
Glucose, Bld: 150 mg/dL — ABNORMAL HIGH (ref 70–99)
Glucose, Bld: 352 mg/dL — ABNORMAL HIGH (ref 70–99)
Potassium: 4.7 mmol/L (ref 3.5–5.1)
Potassium: 4 mmol/L (ref 3.5–5.1)
Potassium: 3.5 mmol/L (ref 3.5–5.1)
Sodium: 129 mmol/L — ABNORMAL LOW (ref 135–145)
Sodium: 136 mmol/L (ref 135–145)
Sodium: 129 mmol/L — ABNORMAL LOW (ref 135–145)

## 2022-05-17 LAB — CBG MONITORING, ED
Glucose-Capillary: 504 mg/dL (ref 70–99)
Glucose-Capillary: >600 mg/dL (ref 70–99)

## 2022-05-17 LAB — RAPID URINE DRUG SCREEN, HOSP PERFORMED
Amphetamines: POSITIVE — AB
Barbiturates: NOT DETECTED
Benzodiazepines: NOT DETECTED
Cocaine: NOT DETECTED
Opiates: NOT DETECTED
Tetrahydrocannabinol: NOT DETECTED

## 2022-05-17 LAB — OSMOLALITY: Osmolality: 289 mOsm/kg (ref 275–295)

## 2022-05-17 LAB — GLUCOSE, CAPILLARY
Glucose-Capillary: 371 mg/dL — ABNORMAL HIGH (ref 70–99)
Glucose-Capillary: 159 mg/dL — ABNORMAL HIGH (ref 70–99)
Glucose-Capillary: 168 mg/dL — ABNORMAL HIGH (ref 70–99)
Glucose-Capillary: 208 mg/dL — ABNORMAL HIGH (ref 70–99)
Glucose-Capillary: 191 mg/dL — ABNORMAL HIGH (ref 70–99)
Glucose-Capillary: 108 mg/dL — ABNORMAL HIGH (ref 70–99)
Glucose-Capillary: 126 mg/dL — ABNORMAL HIGH (ref 70–99)
Glucose-Capillary: 204 mg/dL — ABNORMAL HIGH (ref 70–99)
Glucose-Capillary: 337 mg/dL — ABNORMAL HIGH (ref 70–99)
Glucose-Capillary: 326 mg/dL — ABNORMAL HIGH (ref 70–99)
Glucose-Capillary: 257 mg/dL — ABNORMAL HIGH (ref 70–99)

## 2022-05-17 LAB — MRSA NEXT GEN BY PCR, NASAL: MRSA by PCR Next Gen: DETECTED — AB

## 2022-05-17 LAB — TROPONIN I (HIGH SENSITIVITY): Troponin I (High Sensitivity): 7 ng/L (ref <18)

## 2022-05-17 MED ORDER — ORAL CARE MOUTH RINSE: 15.0000 mL | OROMUCOSAL | Status: DC | PRN

## 2022-05-17 MED ORDER — AMLODIPINE BESYLATE 10 MG PO TABS
10.0000 mg | ORAL_TABLET | Freq: Every day | ORAL | Status: DC
  Administered 2022-05-17 – 2022-05-21 (×5): 10 mg via ORAL
  Filled 2022-05-17 (×5): qty 1

## 2022-05-17 MED ORDER — INSULIN ASPART PROT & ASPART (70-30 MIX) 100 UNIT/ML ~~LOC~~ SUSP
18.0000 [IU] | Freq: Once | SUBCUTANEOUS | Status: AC
  Administered 2022-05-17: 18 [IU] via SUBCUTANEOUS

## 2022-05-17 MED ORDER — NOVOLIN 70/30 FLEXPEN (70-30) 100 UNIT/ML ~~LOC~~ SUPN
28.0000 [IU] | PEN_INJECTOR | Freq: Two times a day (BID) | SUBCUTANEOUS | 1 refills | Status: DC
  Filled 2022-05-17: qty 15, 26d supply, fill #0

## 2022-05-17 MED ORDER — INSULIN ASPART PROT & ASPART (70-30 MIX) 100 UNIT/ML ~~LOC~~ SUSP: 180.0000 [IU] | Freq: Once | SUBCUTANEOUS | Status: DC

## 2022-05-17 MED ORDER — DEXTROSE IN LACTATED RINGERS 5 % IV SOLN: INTRAVENOUS | Status: DC

## 2022-05-17 MED ORDER — INSULIN ASPART PROT & ASPART (70-30 MIX) 100 UNIT/ML ~~LOC~~ SUSP
10.0000 [IU] | Freq: Two times a day (BID) | SUBCUTANEOUS | Status: DC
  Administered 2022-05-17 (×2): 10 [IU] via SUBCUTANEOUS
  Filled 2022-05-17: qty 10

## 2022-05-17 MED ORDER — CHLORHEXIDINE GLUCONATE CLOTH 2 % EX PADS
6.0000 | MEDICATED_PAD | Freq: Every day | CUTANEOUS | Status: DC
  Administered 2022-05-18 – 2022-05-21 (×4): 6 via TOPICAL

## 2022-05-17 MED ORDER — LACTATED RINGERS IV SOLN: INTRAVENOUS | Status: DC

## 2022-05-17 MED ORDER — CEFAZOLIN SODIUM-DEXTROSE 2-4 GM/100ML-% IV SOLN
2.0000 g | Freq: Three times a day (TID) | INTRAVENOUS | Status: DC
  Administered 2022-05-17 – 2022-05-21 (×11): 2 g via INTRAVENOUS
  Filled 2022-05-17 (×10): qty 100

## 2022-05-17 MED ORDER — INSULIN ASPART 100 UNIT/ML IJ SOLN
0.0000 [IU] | Freq: Three times a day (TID) | INTRAMUSCULAR | Status: DC
  Administered 2022-05-17: 7 [IU] via SUBCUTANEOUS
  Administered 2022-05-17: 3 [IU] via SUBCUTANEOUS
  Administered 2022-05-18: 7 [IU] via SUBCUTANEOUS
  Administered 2022-05-18 (×2): 2 [IU] via SUBCUTANEOUS
  Administered 2022-05-19: 7 [IU] via SUBCUTANEOUS
  Administered 2022-05-19: 9 [IU] via SUBCUTANEOUS
  Administered 2022-05-20: 3 [IU] via SUBCUTANEOUS
  Administered 2022-05-20: 2 [IU] via SUBCUTANEOUS
  Administered 2022-05-20: 7 [IU] via SUBCUTANEOUS
  Administered 2022-05-21: 3 [IU] via SUBCUTANEOUS

## 2022-05-17 MED ORDER — INSULIN ASPART 100 UNIT/ML IJ SOLN
0.0000 [IU] | Freq: Every day | INTRAMUSCULAR | Status: DC
  Administered 2022-05-17: 3 [IU] via SUBCUTANEOUS

## 2022-05-17 MED ORDER — INSULIN ASPART PROT & ASPART (70-30 MIX) 100 UNIT/ML ~~LOC~~ SUSP: 28.0000 [IU] | Freq: Two times a day (BID) | SUBCUTANEOUS | Status: DC

## 2022-05-17 NOTE — Hospital Course (Addendum)
[name] [name] is a 36 y.o. male with DMII who presented with body aches, headaches.  He initially was found to have uncontrolled hyperglycemia.  There was concern for recurrence poor diabetes control on admission.  He was treated with fluids and insulin drip.  He was transitioned back to subcutaneous insulins. Further work-up revealed MSSA bacteremia, source unknown.  He was followed by ID. TTE was performed on 05/18/2022 with no evidence of vegetations.  A TEE was planned for 05/23/2022 however patient changed his mind multiple times between leaving AMA and staying hospitalized for TEE.  Due to concern for noncompliance he was given a dose of oritavancin and will complete 4 weeks of cefadroxil for total of 6-week treatment course. This was considered appropriate treatment course and discussed multiple times with ID. Patient was recommended to follow-up with primary care upon returning to [place] and also provided [facility] information.

## 2022-05-17 NOTE — Inpatient Diabetes Management (Signed)
Inpatient Diabetes Program Recommendations  AACE/ADA: New Consensus Statement on Inpatient Glycemic Control (2015)  Target Ranges:  Prepandial:   less than 140 mg/dL      Peak postprandial:   less than 180 mg/dL (1-2 hours)      Critically ill patients:  140 - 180 mg/dL   Lab Results  Component Value Date   GLUCAP 204 (H) 05/17/2022   HGBA1C 9.8 (H) 04/06/2022    Review of Glycemic Control  Diabetes history: type 2? Outpatient Diabetes medications: 70/30 insulin 28 units TID Current orders for Inpatient glycemic control: IV insulin  Inpatient Diabetes Program Recommendations:   Spoke with patient briefly at the bedside. Patient states that he takes "70/30 insulin 28 units three times a day with meals". He has trouble with getting it financially from time to time. States that he checks his blood sugars at home and they run generally around 230 mg/dl. Will need to make sure patient can get his insulin in order to prevent frequent admissions. States that he is taking the insulin.  Harvel Ricks RN BSN CDE Diabetes Coordinator Pager: [phone]  8am-5pm

## 2022-05-17 NOTE — TOC Transition Note (Signed)
Transition of Care [facility]) - CM/SW Discharge Note   Patient Details  Name: [name] [name] MRN: [id number] Date of Birth: 1986-06-11  [facility]) CM/SW Contact:  Roseanne Kaufman, RN Phone Number: 05/17/2022, 3:43 PM   Clinical Narrative:   Received TOC consult for medication assistance. Unsuccessful attempt to speak with patient at bedside. Per chart review patient has medical insurance. On previous admission patient has received [facility] program for medication assistance most recently (10/4).   Patient is not eligible for [facility] program. Attempt to call patient's girlfriend however reached a message that voicemail has not been set up, unable to leave a message.  TOC will continue to follow.    Barriers to Discharge: Continued Medical Work up   Patient Goals and CMS Choice        Discharge Placement                       Discharge Plan and Services   Discharge Planning Services: CM Consult Post Acute Care Choice: NA          DME Arranged: N/A DME Agency: NA       HH Arranged: NA HH Agency: NA        Social Determinants of Health (SDOH) Interventions     Readmission Risk Interventions     No data to display

## 2022-05-17 NOTE — Progress Notes (Signed)
PROGRESS NOTE [name] [name]  UYQ:[id number] DOB: 1985-08-28 DOA: 05/16/2022 PCP: Pcp, No   Brief Narrative/Hospital Course: 36 y.o. male with diabetes mellitus on insulin with previous history of DKA and HHS, hypertension admitted with working diagnosis of diabetes with HHS   He presented with body aches headaches and concern for hyperglycemia given previous similar presentation. He has a recurrent issue with poor diabetes control and admissions for DKA/HHS.  Last noted to have HHS on the 13th of this month.He states that he is taking his insulin but unsure of his dose. In the ED mild tachycardia, labs with sodium 128 which corrects considering glucose of 797, chloride 89, creatinine elevated 1.56 and baseline of 0.9, T. bili 1.4.  CBC within normal limits.  Lactic acid normal.  Troponin normal ,Urinalysis, UDS, blood cultures sent Beta hydroxybutyric acid elevated to 1.0.  Ethanol level negative.  VBG with normal pH and normal PCO2.  Chest x-ray -hypoventilatory chest. Patient was placed on IV fluid hydration, insulin drip and admitted    Subjective: Seen examined this am Alert awake. Insulin drip held as cbg dropped in 108. Started diet Denies nausea vomiting chest pain.  Assessment and Plan: Active Problems:   HTN (hypertension)   AKI (acute kidney injury) (HCC)   DM2 (diabetes mellitus, type 2) (HCC)   Hyperosmolar hyperglycemic state (HHS) (HCC)  Diabetes mellitus on long-term insulin with uncontrolled hyperglycemia HHS Recurrent similar admissions: PT on home 70/30 Novolin 28 units bid and ssi.Anion gap normal on admission w/ cbg in 700.  NG tube discontinued this morning as blood sugar dropped, now on diet start SSI, low-dose 70/30 insulin and monitor to uptitrate. Wean off D5.monitor cbg q4hr. GL:[id number] consulted Recent Labs  Lab 05/17/22 0521 05/17/22 0641 05/17/22 0743 05/17/22 0900 05/17/22 1106  GLUCAP 168* 208* 191* 108* 204*    AKI: Due to HHS  resolved Hypertension: BP well controlled.  Home lisinopril and amlodipine on hold for now -resume slowly  Class I Obesity:Patient's Body mass index is 30.04 kg/m. : Will benefit with PCP follow-up, weight loss  healthy lifestyle and outpatient sleep evaluation.  DVT prophylaxis: enoxaparin (LOVENOX) injection 40 mg Start: 05/17/22 1000 Code Status:   Code Status: Full Code Family Communication: plan of care discussed with patient at bedside. Patient status is: Observation but remains hospitalized due to close monitoring of his blood sugar while we resume insulin regimen  Level of care: Stepdown  Dispo: The patient is from: home            Anticipated disposition: home  Objective: Vitals last 24 hrs: Vitals:   05/17/22 0703 05/17/22 0800 05/17/22 0900 05/17/22 1000  BP: (!) 156/103 125/87 (!) 150/107 121/83  Pulse: 89 94 86 84  Resp: 13 14 12 10   Temp:  98.5 F (36.9 C)    TempSrc:  Oral    SpO2: 98% 97% 98% 98%  Weight:      Height:       Weight change:   Physical Examination: General exam: alert awake, older than stated age HEENT:Oral mucosa moist, Ear/Nose WNL grossly Respiratory system: bilaterally clear BS,no use of accessory muscle Cardiovascular system: S1 & S2 +, No JVD. Gastrointestinal system: Abdomen soft,NT,ND, BS+ Nervous System:Alert, awake, moving extremities. Extremities: LE edema neg,distal peripheral pulses palpable.  Skin: No rashes,no icterus. MSK: Normal muscle bulk,tone, power  Medications reviewed:  Scheduled Meds:  Chlorhexidine Gluconate Cloth  6 each Topical Daily   enoxaparin (LOVENOX) injection  40 mg Subcutaneous Q24H   insulin  aspart  0-5 Units Subcutaneous QHS   insulin aspart  0-9 Units Subcutaneous TID WC   insulin aspart protamine- aspart  10 Units Subcutaneous BID WC  Continuous Infusions:  lactated ringers 75 mL/hr at 05/17/22 1122   Unresulted Labs (From admission, onward)     Start     Ordered   05/23/22 0500  Creatinine,  serum  (enoxaparin (LOVENOX)    CrCl >/= 30 ml/min)  Weekly,   R     Comments: while on enoxaparin therapy    05/16/22 2320   05/18/22 1610  Basic metabolic panel  (Hyperglycemic Hyperosmolar State (HHS))  Tomorrow morning,   R       Question:  Specimen collection method  Answer:  Lab=Lab collect   05/17/22 1114   05/17/22 9604  Basic metabolic panel  Once-Timed,   TIMED       Question:  Specimen collection method  Answer:  Lab=Lab collect   05/17/22 1114   05/16/22 2124  Rapid urine drug screen (hospital performed)  ONCE - STAT,   STAT        05/16/22 2123          Data Reviewed: I have personally reviewed following labs and imaging studies CBC: Recent Labs  Lab 05/16/22 2126 05/16/22 2221  WBC 5.3  --   NEUTROABS 2.8  --   HGB 15.2 15.6  HCT 44.1 46.0  MCV 81.4  --   PLT 241  --    Basic Metabolic Panel: Recent Labs  Lab 05/16/22 2126 05/16/22 2221 05/17/22 0054 05/17/22 0512  NA 128* 127* 129* 136  K 4.6 4.6 4.7 4.0  CL 89* 89* 94* 103  CO2 26  --  26 26  GLUCOSE 797* >700* 624* 150*  BUN 18 18 18 18   CREATININE 1.56* 1.40* 1.42* 1.05  CALCIUM 9.2  --  8.7* 9.3    Antimicrobials: Anti-infectives (From admission, onward)    None      Culture/Microbiology    Component Value Date/Time   SDES  05/17/2022 0102    BLOOD SITE NOT SPECIFIED Performed at [facility], [place] [address], [place], [place] [address]    SPECREQUEST  05/17/2022 0102    BOTTLES DRAWN AEROBIC AND ANAEROBIC Blood Culture adequate volume Performed at [facility] [address] [place], [place] [address]    CULT  05/17/2022 0102    NO GROWTH < 12 HOURS Performed at [facility] [address], [place], [place] [address]    REPTSTATUS PENDING 05/17/2022 0102    Radiology Studies: [facility] Chest Port 1 View  Result Date: 05/16/2022 CLINICAL DATA:  Chest pain. EXAM: PORTABLE CHEST 1 VIEW COMPARISON:  Most recent radiograph 04/30/2022. Most  recent CT 04/05/2022 FINDINGS: Lung volumes are low.The cardiomediastinal contours are normal. The lungs are clear. Pulmonary vasculature is normal. No consolidation, pleural effusion, or pneumothorax. No acute osseous abnormalities are seen. IMPRESSION: Hypoventilatory chest without acute abnormality. Electronically Signed   By: Keith Rake M.D.   On: 05/16/2022 21:49     LOS: 0 days   Antonieta Pert, MD [facility]  05/17/2022, 1:04 PM

## 2022-05-17 NOTE — Progress Notes (Signed)
PHARMACY - PHYSICIAN COMMUNICATION CRITICAL VALUE ALERT - BLOOD CULTURE IDENTIFICATION (BCID)  [name] [name] is an 36 y.o. male who presented to [facility] on 05/16/2022 with a chief complaint of uncontrolled hyperglycemia  Assessment:  1/2 BCx sets growing MSSA (source unknown)  Name of physician (or Provider) Contacted: Zebedee Iba  Current antibiotics: none  Changes to prescribed antibiotics recommended: start Ancef 2g IV q8 hr Recommendations accepted by provider  Results for orders placed or performed during the hospital encounter of 05/16/22  Blood Culture ID Panel (Reflexed) (Collected: 05/17/2022  1:02 AM)  Result Value Ref Range   Enterococcus faecalis NOT DETECTED NOT DETECTED   Enterococcus Faecium NOT DETECTED NOT DETECTED   Listeria monocytogenes NOT DETECTED NOT DETECTED   Staphylococcus species DETECTED (A) NOT DETECTED   Staphylococcus aureus (BCID) DETECTED (A) NOT DETECTED   Staphylococcus epidermidis NOT DETECTED NOT DETECTED   Staphylococcus lugdunensis NOT DETECTED NOT DETECTED   Streptococcus species NOT DETECTED NOT DETECTED   Streptococcus agalactiae NOT DETECTED NOT DETECTED   Streptococcus pneumoniae NOT DETECTED NOT DETECTED   Streptococcus pyogenes NOT DETECTED NOT DETECTED   A.calcoaceticus-baumannii NOT DETECTED NOT DETECTED   Bacteroides fragilis NOT DETECTED NOT DETECTED   Enterobacterales NOT DETECTED NOT DETECTED   Enterobacter cloacae complex NOT DETECTED NOT DETECTED   Escherichia coli NOT DETECTED NOT DETECTED   Klebsiella aerogenes NOT DETECTED NOT DETECTED   Klebsiella oxytoca NOT DETECTED NOT DETECTED   Klebsiella pneumoniae NOT DETECTED NOT DETECTED   Proteus species NOT DETECTED NOT DETECTED   Salmonella species NOT DETECTED NOT DETECTED   Serratia marcescens NOT DETECTED NOT DETECTED   Haemophilus influenzae NOT DETECTED NOT DETECTED   Neisseria meningitidis NOT DETECTED NOT DETECTED   Pseudomonas aeruginosa NOT DETECTED NOT DETECTED    Stenotrophomonas maltophilia NOT DETECTED NOT DETECTED   Candida albicans NOT DETECTED NOT DETECTED   Candida auris NOT DETECTED NOT DETECTED   Candida glabrata NOT DETECTED NOT DETECTED   Candida krusei NOT DETECTED NOT DETECTED   Candida parapsilosis NOT DETECTED NOT DETECTED   Candida tropicalis NOT DETECTED NOT DETECTED   Cryptococcus neoformans/gattii NOT DETECTED NOT DETECTED   Meth resistant mecA/C and MREJ NOT DETECTED NOT DETECTED    Sharelle Burditt A 05/17/2022  7:58 PM

## 2022-05-18 ENCOUNTER — Observation Stay (HOSPITAL_COMMUNITY): Payer: No Typology Code available for payment source

## 2022-05-18 DIAGNOSIS — R7881 Bacteremia: Secondary | ICD-10-CM

## 2022-05-18 DIAGNOSIS — B9561 Methicillin susceptible Staphylococcus aureus infection as the cause of diseases classified elsewhere: Secondary | ICD-10-CM

## 2022-05-18 DIAGNOSIS — E119 Type 2 diabetes mellitus without complications: Secondary | ICD-10-CM | POA: Diagnosis not present

## 2022-05-18 DIAGNOSIS — Z6830 Body mass index (BMI) 30.0-30.9, adult: Secondary | ICD-10-CM | POA: Diagnosis not present

## 2022-05-18 DIAGNOSIS — E6609 Other obesity due to excess calories: Secondary | ICD-10-CM | POA: Diagnosis not present

## 2022-05-18 DIAGNOSIS — Z91119 Patient's noncompliance with dietary regimen due to unspecified reason: Secondary | ICD-10-CM | POA: Diagnosis not present

## 2022-05-18 DIAGNOSIS — N179 Acute kidney failure, unspecified: Secondary | ICD-10-CM | POA: Diagnosis present

## 2022-05-18 DIAGNOSIS — F17210 Nicotine dependence, cigarettes, uncomplicated: Secondary | ICD-10-CM

## 2022-05-18 DIAGNOSIS — I10 Essential (primary) hypertension: Secondary | ICD-10-CM | POA: Diagnosis present

## 2022-05-18 DIAGNOSIS — Z79899 Other long term (current) drug therapy: Secondary | ICD-10-CM | POA: Diagnosis not present

## 2022-05-18 DIAGNOSIS — E1100 Type 2 diabetes mellitus with hyperosmolarity without nonketotic hyperglycemic-hyperosmolar coma (NKHHC): Secondary | ICD-10-CM | POA: Diagnosis present

## 2022-05-18 DIAGNOSIS — E669 Obesity, unspecified: Secondary | ICD-10-CM | POA: Diagnosis present

## 2022-05-18 DIAGNOSIS — Z794 Long term (current) use of insulin: Secondary | ICD-10-CM | POA: Diagnosis not present

## 2022-05-18 LAB — GLUCOSE, CAPILLARY
Glucose-Capillary: 174 mg/dL — ABNORMAL HIGH (ref 70–99)
Glucose-Capillary: 200 mg/dL — ABNORMAL HIGH (ref 70–99)
Glucose-Capillary: 183 mg/dL — ABNORMAL HIGH (ref 70–99)
Glucose-Capillary: 303 mg/dL — ABNORMAL HIGH (ref 70–99)
Glucose-Capillary: 421 mg/dL — ABNORMAL HIGH (ref 70–99)

## 2022-05-18 LAB — ECHOCARDIOGRAM COMPLETE
AR max vel: 2.92 cm2
AV Area VTI: 2.99 cm2
AV Area mean vel: 2.97 cm2
AV Mean grad: 2 mmHg
AV Peak grad: 4.1 mmHg
Ao pk vel: 1.01 m/s
Area-P 1/2: 2.99 cm2
Calc EF: 61.7 %
Height: 71 in
S' Lateral: 2.9 cm
Single Plane A2C EF: 59.6 %
Single Plane A4C EF: 62 %
Weight: 3446.23 oz

## 2022-05-18 LAB — BASIC METABOLIC PANEL
Anion gap: 5 (ref 5–15)
BUN: 14 mg/dL (ref 6–20)
CO2: 25 mmol/L (ref 22–32)
Calcium: 8.6 mg/dL — ABNORMAL LOW (ref 8.9–10.3)
Chloride: 102 mmol/L (ref 98–111)
Creatinine, Ser: 0.82 mg/dL (ref 0.61–1.24)
GFR, Estimated: >60 mL/min (ref >60)
Glucose, Bld: 207 mg/dL — ABNORMAL HIGH (ref 70–99)
Potassium: 3.4 mmol/L — ABNORMAL LOW (ref 3.5–5.1)
Sodium: 132 mmol/L — ABNORMAL LOW (ref 135–145)

## 2022-05-18 LAB — GLUCOSE, RANDOM: Glucose, Bld: 413 mg/dL — ABNORMAL HIGH (ref 70–99)

## 2022-05-18 MED ORDER — INSULIN ASPART 100 UNIT/ML IJ SOLN
6.0000 [IU] | Freq: Once | INTRAMUSCULAR | Status: AC
  Administered 2022-05-18: 6 [IU] via SUBCUTANEOUS

## 2022-05-18 MED ORDER — LISINOPRIL 10 MG PO TABS
10.0000 mg | ORAL_TABLET | Freq: Every day | ORAL | Status: DC
  Administered 2022-05-18 – 2022-05-21 (×4): 10 mg via ORAL
  Filled 2022-05-18 (×4): qty 1

## 2022-05-18 MED ORDER — INSULIN ASPART PROT & ASPART (70-30 MIX) 100 UNIT/ML ~~LOC~~ SUSP
30.0000 [IU] | Freq: Two times a day (BID) | SUBCUTANEOUS | Status: DC
  Administered 2022-05-18 – 2022-05-19 (×3): 30 [IU] via SUBCUTANEOUS

## 2022-05-18 MED ORDER — NOVOLIN 70/30 FLEXPEN (70-30) 100 UNIT/ML ~~LOC~~ SUPN: 28.0000 [IU] | PEN_INJECTOR | Freq: Two times a day (BID) | SUBCUTANEOUS | 0 refills | Status: DC

## 2022-05-18 NOTE — Progress Notes (Signed)
PROGRESS NOTE [name] [name]  WUJ:[id number] DOB: 19-Sep-1985 DOA: 05/16/2022 PCP: Pcp, No   Brief Narrative/Hospital Course: 36 y.o. male with diabetes mellitus on insulin with previous history of DKA and HHS, hypertension admitted with working diagnosis of diabetes with HHS   He presented with body aches headaches and concern for hyperglycemia given previous similar presentation. He has a recurrent issue with poor diabetes control and admissions for DKA/HHS.  Last noted to have HHS on the 13th of this month.He states that he is taking his insulin but unsure of his dose. In the ED mild tachycardia, labs with sodium 128 which corrects considering glucose of 797, chloride 89, creatinine elevated 1.56 and baseline of 0.9, T. bili 1.4.  CBC within normal limits.  Lactic acid normal.  Troponin normal ,Urinalysis, UDS, blood cultures sent Beta hydroxybutyric acid elevated to 1.0.  Ethanol level negative.  VBG with normal pH and normal PCO2.  Chest x-ray -hypoventilatory chest. Patient was placed on IV fluid hydration, insulin drip and admitted. Blood sugar is stabilized, transitioned to subcu insulin.  Case manager consulted to help with medication he recently had a [place] for his medications on 10/4. At this time blood sugar has stabilized patient back on home insulin regimen.    Subjective:  Seen and examined this morning no new complaints.  Patient reports he does have insulin at home and does not have monitored by  Overnight found to have bacteremia placed on Ancef  Assessment and Plan: Active Problems:   HTN (hypertension)   AKI (acute kidney injury) ([place])   DM2 (diabetes mellitus, type 2) ([place])   Hyperosmolar hyperglycemic state (HHS) ([place])  Diabetes mellitus on long-term insulin with uncontrolled hyperglycemia HHS Recurrent similar admissions: PT on home 70/30 Novolin 28 units bid and ssi.Anion gap normal on admission w/ cbg in 700.  Blood sugar stabilizing continue current mix -TOC  providing coupon, if he is able to fill out his prescription he can be discharged.  For now continue to monitor as below  Recent Labs  Lab 05/17/22 1652 05/17/22 1936 05/17/22 2326 05/18/22 0355 05/18/22 0812  GLUCAP 337* 326* 257* 174* 200*     MSSA bacteremia 1/2 blood culture sets 05/17/22 placed on Ancef, echo ordered.  Unclear source.  Follow-up blood culture data  AKI: Due to HHS resolved Recent Labs  Lab 05/16/22 2221 05/17/22 0054 05/17/22 0512 05/17/22 1701 05/18/22 1133  BUN 18 18 18 17 14   CREATININE 1.40* 1.42* 1.05 1.02 0.82    Hypertension: BP borderline controlled on amlodipine resume lisinopril continued home dose.   Class I Obesity:Patient's Body mass index is 30.04 kg/m. : Will benefit with PCP follow-up, weight loss  healthy lifestyle and outpatient sleep evaluation.  DVT prophylaxis: enoxaparin (LOVENOX) injection 40 mg Start: 05/17/22 1000 Code Status:   Code Status: Full Code Family Communication: plan of care discussed with patient at bedside. Patient status is: Observation but remains hospitalized due to uncontrolled hyperglycemia, bacteremia  Level of care: Stepdown  Dispo: The patient is from: home            Anticipated disposition: home  Objective: Vitals last 24 hrs: Vitals:   05/17/22 1719 05/17/22 2000 05/17/22 2100 05/17/22 2335  BP: (!) 145/83 (!) 144/91 (!) 144/94   Pulse: 88     Resp: 18     Temp: 98.5 F (36.9 C)   98.5 F (36.9 C)  TempSrc: Oral   Oral  SpO2: 98%     Weight:  Height:       Weight change:   Physical Examination: General exam: AAOX3, weak,older appearing HEENT:Oral mucosa moist, Ear/Nose WNL grossly, dentition normal. Respiratory system: bilaterally CLEARdiminished BS, no use of accessory muscle Cardiovascular system: S1 & S2 +, regular rate, JVD NEG. Gastrointestinal system: Abdomen soft, NT,ND,BS+ Nervous System:Alert, awake, moving extremities and grossly nonfocal Extremities: LE ankle edema NEG,  lower extremities warm Skin: No rashes,no icterus. MSK: Normal muscle bulk,tone, power   Medications reviewed:  Scheduled Meds:  amLODipine  10 mg Oral Daily   Chlorhexidine Gluconate Cloth  6 each Topical Daily   enoxaparin (LOVENOX) injection  40 mg Subcutaneous Q24H   insulin aspart  0-5 Units Subcutaneous QHS   insulin aspart  0-9 Units Subcutaneous TID WC   insulin aspart protamine- aspart  30 Units Subcutaneous BID WC  Continuous Infusions:   ceFAZolin (ANCEF) IV Stopped (05/18/22 0659)   lactated ringers 75 mL/hr at 05/18/22 1117   Unresulted Labs (From admission, onward)     Start     Ordered   05/23/22 0500  Creatinine, serum  (enoxaparin (LOVENOX)    CrCl >/= 30 ml/min)  Weekly,   R     Comments: while on enoxaparin therapy    05/16/22 2320          Data Reviewed: I have personally reviewed following labs and imaging studies CBC: Recent Labs  Lab 05/16/22 2126 05/16/22 2221  WBC 5.3  --   NEUTROABS 2.8  --   HGB 15.2 15.6  HCT 44.1 46.0  MCV 81.4  --   PLT 241  --     Basic Metabolic Panel: Recent Labs  Lab 05/16/22 2126 05/16/22 2221 05/17/22 0054 05/17/22 0512 05/17/22 1701 05/18/22 1133  NA 128* 127* 129* 136 129* 132*  K 4.6 4.6 4.7 4.0 3.5 3.4*  CL 89* 89* 94* 103 98 102  CO2 26  --  26 26 22 25   GLUCOSE 797* >700* 624* 150* 352* 207*  BUN 18 18 18 18 17 14   CREATININE 1.56* 1.40* 1.42* 1.05 1.02 0.82  CALCIUM 9.2  --  8.7* 9.3 8.4* 8.6*     Antimicrobials: Anti-infectives (From admission, onward)    Start     Dose/Rate Route Frequency Ordered Stop   05/17/22 2100  ceFAZolin (ANCEF) IVPB 2g/100 mL premix        2 g 200 mL/hr over 30 Minutes Intravenous Every 8 hours 05/17/22 2000        Culture/Microbiology    Component Value Date/Time   SDES  05/17/2022 0102    BLOOD SITE NOT SPECIFIED Performed at [facility], [place] [address] [place], [place] [address]    SPECREQUEST  05/17/2022 0102    BOTTLES DRAWN  AEROBIC AND ANAEROBIC Blood Culture adequate volume Performed at [facility] [address], [place], [place] [address]    CULT (A) 05/17/2022 0102    STAPHYLOCOCCUS AUREUS SUSCEPTIBILITIES TO FOLLOW Performed at [facility] [address] [place], [place] [address]    REPTSTATUS PENDING 05/17/2022 0102    Radiology Studies: [facility] Chest Port 1 View  Result Date: 05/16/2022 CLINICAL DATA:  Chest pain. EXAM: PORTABLE CHEST 1 VIEW COMPARISON:  Most recent radiograph 04/30/2022. Most recent CT 04/05/2022 FINDINGS: Lung volumes are low.The cardiomediastinal contours are normal. The lungs are clear. Pulmonary vasculature is normal. No consolidation, pleural effusion, or pneumothorax. No acute osseous abnormalities are seen. IMPRESSION: Hypoventilatory chest without acute abnormality. Electronically Signed  By: Keith Rake M.D.   On: 05/16/2022 21:49     LOS: 0 days   Antonieta Pert, MD [facility]  05/18/2022, 12:09 PM

## 2022-05-18 NOTE — Discharge Instructions (Signed)
10/21 -patient will use [employer]oupon to have prescription for 70/30 insulin filled at [facility].  Follow-up with primary care in [place]

## 2022-05-18 NOTE — TOC Progression Note (Signed)
Transition of Care [facility]) - Progression Note    Patient Details  Name: [name] [name] MRN: [id number] Date of Birth: [birth year]  Transition of Care [facility]) CM/SW Contact  Henrietta Dine, RN Phone Number: 05/18/2022, 10:04 AM  Clinical Narrative:    Notified by Hal Hope, RN that pt needs assistance for getting insulin 70/30; pt not available when this CM went to his room; spoke with pt previously received MATCH; gave Candace, RN discount form for [employer]; she will give it to the pt; no TOC needs.   Expected Discharge Plan: Home/Self Care Barriers to Discharge: Continued Medical Work up  Expected Discharge Plan and Services Expected Discharge Plan: Home/Self Care   Discharge Planning Services: CM Consult Post Acute Care Choice: NA Living arrangements for the past 2 months: Apartment                 DME Arranged: N/A DME Agency: NA       HH Arranged: NA HH Agency: NA         Social Determinants of Health (SDOH) Interventions    Readmission Risk Interventions     No data to display

## 2022-05-18 NOTE — Consult Note (Signed)
[name] [name] 05/18/2022, 3:32 PM  Date of Admission:  05/16/2022          Reason for Consult: MSSA bacteremia    Referring Provider: Candy Sledge auto consult and Lanae Boast, MD   Assessment:  MSSA bacteremia 1/2 + cultures from admission DKA/HHS AKI  Plan:  Continue cefazolin Follow-up echocardiogram read I am repeating his blood cultures today  Active Problems:   HTN (hypertension)   AKI (acute kidney injury) ([facility])   DM2 (diabetes mellitus, type 2) ([facility])   Hyperosmolar hyperglycemic state (HHS) ([facility])   Scheduled Meds:  amLODipine  10 mg Oral Daily   Chlorhexidine Gluconate Cloth  6 each Topical Daily   enoxaparin (LOVENOX) injection  40 mg Subcutaneous Q24H   insulin aspart  0-5 Units Subcutaneous QHS   insulin aspart  0-9 Units Subcutaneous TID WC   insulin aspart protamine- aspart  30 Units Subcutaneous BID WC   lisinopril  10 mg Oral Daily   Continuous Infusions:   ceFAZolin (ANCEF) IV Stopped (05/18/22 1329)   lactated ringers 75 mL/hr at 05/18/22 1507   PRN Meds:.dextrose, mouth rinse, mouth rinse  HPI: [name] [name] is a 35 y.o. male with insulin dependent diabetes mellitus and multiple admissions for DKA/HHS who presented yet again to the hospital ER after being discharged on 13 October now on October 19 with headaches body aches and concerns that "my sugars out of control".  In the ER he was tachycardic and hypertensive.  CMP had shown evidence of acute renal insufficiency with creatinine up to 1.56 and blood sugar of 797.  Chest x-ray showed some hypoventilatory findings.  He had blood cultures taken as well as urinalysis and cultures.  An insulin drip was started along with IV fluids and he was placed in the ICU.  Since then blood cultures have come back positive in 1 of 2 sites that were taken with MSSA isolated on [facility] ID.  Cefazolin was started last night and he has now had at least 3 doses of it.  Echocardiogram has been performed and  read is pending.  I will repeat his blood cultures.  I anticipate if he does well he is somewhat weekend up switching him over to oral antibiotics to complete treatment.  There does not appear to be any clinical evidence for deep infection.  Source of his bacteremia remains unclear.  I spent 82 minutes with the patient including than 50% of the time in face to face counseling of the patient guarding his MSSA bacteremia DKA/HHS AKI, personally reviewing chest x-ray along with review of medical records in preparation for the visit and during the visit and in coordination of his care.    Review of Systems: Review of Systems  Constitutional:  Positive for chills and malaise/fatigue. Negative for fever and weight loss.  HENT:  Negative for congestion and sore throat.   Eyes:  Negative for blurred vision and photophobia.  Respiratory:  Negative for cough, shortness of breath and wheezing.   Cardiovascular:  Negative for chest pain, palpitations and leg swelling.  Gastrointestinal:  Positive for nausea. Negative for abdominal pain, blood in stool, constipation, diarrhea, heartburn, melena and vomiting.  Genitourinary:  Negative for dysuria, flank pain and hematuria.  Musculoskeletal:  Negative for back pain, falls, joint pain and myalgias.  Skin:  Negative for itching and rash.  Neurological:  Negative for dizziness, focal weakness, loss of consciousness, weakness and headaches.  Endo/Heme/Allergies:  Does not bruise/bleed easily.  Psychiatric/Behavioral:  Negative for depression and suicidal ideas. The patient does not have insomnia.    He has scars from surgery on the right arm where had broken arm and carpal tunnel release surgery  Tattoos  Feet very dry with elongated nails but no DFU, no Janeway lesions no splinter hemorrhages   Past Medical History:  Diagnosis Date   Diabetes mellitus without complication ([place])    DKA (diabetic ketoacidosis) ([place]) 05/08/2022   Hyperosmolar  hyperglycemic state (HHS) ([place]) 05/08/2022   Hypoglycemia 07/02/2019   Pseudohyponatremia 05/08/2022    Social History   Tobacco Use   Smoking status: Every Day    Packs/day: 0.50    Years: 10.00    Total pack years: 5.00    Types: Cigarettes   Smokeless tobacco: Never  Vaping Use   Vaping Use: Never used  Substance Use Topics   Alcohol use: Yes   Drug use: Never    Family History  Family history unknown: Yes   No Known Allergies  OBJECTIVE: Blood pressure (!) 122/58, pulse 93, temperature 98.2 F (36.8 C), temperature source Oral, resp. rate 11, height 5\' 11"  (1.803 m), weight 97.7 kg, SpO2 97 %.  Physical Exam Constitutional:      Appearance: Normal appearance. He is well-developed. He is obese.  HENT:     Head: Normocephalic and atraumatic.  Eyes:     General:        Right eye: No discharge.        Left eye: No discharge.     Conjunctiva/sclera: Conjunctivae normal.  Cardiovascular:     Rate and Rhythm: Normal rate and regular rhythm.     Heart sounds: No murmur heard.    No friction rub. No gallop.  Pulmonary:     Effort: Pulmonary effort is normal. No respiratory distress.     Breath sounds: Normal breath sounds. No stridor. No wheezing or rhonchi.  Abdominal:     General: There is no distension.     Palpations: Abdomen is soft. There is no mass.     Tenderness: There is no abdominal tenderness.  Musculoskeletal:        General: No tenderness. Normal range of motion.     Cervical back: Normal range of motion and neck supple.  Skin:    General: Skin is warm and dry.     Coloration: Skin is not pale.     Findings: No erythema or rash.  Neurological:     General: No focal deficit present.     Mental Status: He is alert and oriented to person, place, and time.  Psychiatric:        Mood and Affect: Mood normal.        Behavior: Behavior normal.        Thought Content: Thought content normal.        Judgment: Judgment normal.     Lab Results Lab  Results  Component Value Date   WBC 5.3 05/16/2022   HGB 15.6 05/16/2022   HCT 46.0 05/16/2022   MCV 81.4 05/16/2022   PLT 241 05/16/2022    Lab Results  Component Value Date   CREATININE 0.82 05/18/2022   BUN 14 05/18/2022   NA 132 (L) 05/18/2022   K 3.4 (L) 05/18/2022   CL 102 05/18/2022   CO2 25 05/18/2022    Lab Results  Component Value Date   ALT 15 05/16/2022   AST 16 05/16/2022   ALKPHOS 70 05/16/2022   BILITOT 1.4 (H) 05/16/2022  Microbiology: Recent Results (from the past 240 hour(s))  MRSA Next Gen by PCR, Nasal     Status: None   Collection Time: 05/08/22  4:33 PM   Specimen: Nasal Mucosa; Nasal Swab  Result Value Ref Range Status   MRSA by PCR Next Gen NOT DETECTED NOT DETECTED Final    Comment: (NOTE) The GeneXpert MRSA Assay (FDA approved for NASAL specimens only), is one component of a comprehensive MRSA colonization surveillance program. It is not intended to diagnose MRSA infection nor to guide or monitor treatment for MRSA infections. Test performance is not FDA approved in patients less than 82 years old. Performed at [facility], [address] [place], [place] [address]   Culture, blood (routine x 2)     Status: Abnormal (Preliminary result)   Collection Time: 05/16/22  9:21 PM   Specimen: BLOOD  Result Value Ref Range Status   Specimen Description   Final    BLOOD SITE NOT SPECIFIED Performed at [facility], [address], [place], [place] [address]    Special Requests   Final    BOTTLES DRAWN AEROBIC AND ANAEROBIC Blood Culture adequate volume Performed at [facility], [address], [place], [place] [address]    Culture  Setup Time   Final    GRAM POSITIVE COCCI IN CLUSTERS ANAEROBIC BOTTLE ONLY CRITICAL VALUE NOTED.  VALUE IS CONSISTENT WITH PREVIOUSLY REPORTED AND CALLED VALUE. Performed at [facility], [address] [place], [place] [address]    Culture  STAPHYLOCOCCUS AUREUS (A)  Final   Report Status PENDING  Incomplete  Culture, blood (routine x 2)     Status: Abnormal (Preliminary result)   Collection Time: 05/17/22  1:02 AM   Specimen: BLOOD  Result Value Ref Range Status   Specimen Description   Final    BLOOD SITE NOT SPECIFIED Performed at [facility], [address], [place], [place] [address]    Special Requests   Final    BOTTLES DRAWN AEROBIC AND ANAEROBIC Blood Culture adequate volume Performed at [facility], [address] [place], [place] [address]    Culture  Setup Time   Final    GRAM POSITIVE COCCI IN CLUSTERS IN BOTH AEROBIC AND ANAEROBIC BOTTLES Organism ID to follow CRITICAL RESULT CALLED TO, READ BACK BY AND VERIFIED WITH: PHARMD D. WOFFORD [id number] FH    Culture (A)  Final    STAPHYLOCOCCUS AUREUS SUSCEPTIBILITIES TO FOLLOW Performed at [facility], [address], [place], [place] [address]    Report Status PENDING  Incomplete  Blood Culture ID Panel (Reflexed)     Status: Abnormal   Collection Time: 05/17/22  1:02 AM  Result Value Ref Range Status   Enterococcus faecalis NOT DETECTED NOT DETECTED Final   Enterococcus Faecium NOT DETECTED NOT DETECTED Final   Listeria monocytogenes NOT DETECTED NOT DETECTED Final   Staphylococcus species DETECTED (A) NOT DETECTED Final    Comment: CRITICAL RESULT CALLED TO, READ BACK BY AND VERIFIED WITH: PHARMD D. WOFFORD [id number] 1902 FH    Staphylococcus aureus (BCID) DETECTED (A) NOT DETECTED Final    Comment: CRITICAL RESULT CALLED TO, READ BACK BY AND VERIFIED WITH: PHARMD D. [id number] FH    Staphylococcus epidermidis NOT DETECTED NOT DETECTED Final   Staphylococcus lugdunensis NOT DETECTED NOT DETECTED Final   Streptococcus species NOT DETECTED NOT DETECTED Final   Streptococcus agalactiae NOT DETECTED NOT DETECTED  Final   Streptococcus pneumoniae NOT DETECTED NOT DETECTED Final   Streptococcus  pyogenes NOT DETECTED NOT DETECTED Final   A.calcoaceticus-baumannii NOT DETECTED NOT DETECTED Final   Bacteroides fragilis NOT DETECTED NOT DETECTED Final   Enterobacterales NOT DETECTED NOT DETECTED Final   Enterobacter cloacae complex NOT DETECTED NOT DETECTED Final   Escherichia coli NOT DETECTED NOT DETECTED Final   Klebsiella aerogenes NOT DETECTED NOT DETECTED Final   Klebsiella oxytoca NOT DETECTED NOT DETECTED Final   Klebsiella pneumoniae NOT DETECTED NOT DETECTED Final   Proteus species NOT DETECTED NOT DETECTED Final   Salmonella species NOT DETECTED NOT DETECTED Final   Serratia marcescens NOT DETECTED NOT DETECTED Final   Haemophilus influenzae NOT DETECTED NOT DETECTED Final   Neisseria meningitidis NOT DETECTED NOT DETECTED Final   Pseudomonas aeruginosa NOT DETECTED NOT DETECTED Final   Stenotrophomonas maltophilia NOT DETECTED NOT DETECTED Final   Candida albicans NOT DETECTED NOT DETECTED Final   Candida auris NOT DETECTED NOT DETECTED Final   Candida glabrata NOT DETECTED NOT DETECTED Final   Candida krusei NOT DETECTED NOT DETECTED Final   Candida parapsilosis NOT DETECTED NOT DETECTED Final   Candida tropicalis NOT DETECTED NOT DETECTED Final   Cryptococcus neoformans/gattii NOT DETECTED NOT DETECTED Final   Meth resistant mecA/C and MREJ NOT DETECTED NOT DETECTED Final    Comment: Performed at [facility], [address] [place], [place] [address]  MRSA Next Gen by PCR, Nasal     Status: Abnormal   Collection Time: 05/17/22  1:37 AM   Specimen: Nasal Mucosa; Nasal Swab  Result Value Ref Range Status   MRSA by PCR Next Gen DETECTED (A) NOT DETECTED Final    Comment: (NOTE) The GeneXpert MRSA Assay (FDA approved for NASAL specimens only), is one component of a comprehensive MRSA colonization surveillance program. It is not intended to diagnose MRSA infection nor to guide or monitor treatment for MRSA infections. Test performance is not FDA approved in  patients less than 68 years old. Performed at [facility], [address] [place], [place] [address]     [name] Lav, MD [facility] Infectious Disease [facility] [phone] pager  05/18/2022, 3:32 PM

## 2022-05-18 NOTE — Progress Notes (Signed)
  Echocardiogram 2D Echocardiogram has been performed.  [name] [name] 05/18/2022, 9:19 AM

## 2022-05-19 DIAGNOSIS — R7881 Bacteremia: Secondary | ICD-10-CM | POA: Diagnosis not present

## 2022-05-19 DIAGNOSIS — B9561 Methicillin susceptible Staphylococcus aureus infection as the cause of diseases classified elsewhere: Secondary | ICD-10-CM | POA: Diagnosis present

## 2022-05-19 DIAGNOSIS — E119 Type 2 diabetes mellitus without complications: Secondary | ICD-10-CM | POA: Diagnosis not present

## 2022-05-19 DIAGNOSIS — Z794 Long term (current) use of insulin: Secondary | ICD-10-CM | POA: Diagnosis not present

## 2022-05-19 DIAGNOSIS — N179 Acute kidney failure, unspecified: Secondary | ICD-10-CM | POA: Diagnosis not present

## 2022-05-19 DIAGNOSIS — E1100 Type 2 diabetes mellitus with hyperosmolarity without nonketotic hyperglycemic-hyperosmolar coma (NKHHC): Secondary | ICD-10-CM | POA: Diagnosis not present

## 2022-05-19 LAB — CULTURE, BLOOD (ROUTINE X 2)
Special Requests: ADEQUATE
Special Requests: ADEQUATE

## 2022-05-19 LAB — BASIC METABOLIC PANEL
Anion gap: 5 (ref 5–15)
BUN: 15 mg/dL (ref 6–20)
CO2: 26 mmol/L (ref 22–32)
Calcium: 8.5 mg/dL — ABNORMAL LOW (ref 8.9–10.3)
Chloride: 100 mmol/L (ref 98–111)
Creatinine, Ser: 0.98 mg/dL (ref 0.61–1.24)
GFR, Estimated: >60 mL/min (ref >60)
Glucose, Bld: 411 mg/dL — ABNORMAL HIGH (ref 70–99)
Potassium: 4 mmol/L (ref 3.5–5.1)
Sodium: 131 mmol/L — ABNORMAL LOW (ref 135–145)

## 2022-05-19 LAB — GLUCOSE, CAPILLARY
Glucose-Capillary: 328 mg/dL — ABNORMAL HIGH (ref 70–99)
Glucose-Capillary: 359 mg/dL — ABNORMAL HIGH (ref 70–99)
Glucose-Capillary: 94 mg/dL (ref 70–99)
Glucose-Capillary: 122 mg/dL — ABNORMAL HIGH (ref 70–99)

## 2022-05-19 MED ORDER — INSULIN ASPART PROT & ASPART (70-30 MIX) 100 UNIT/ML ~~LOC~~ SUSP: 38.0000 [IU] | Freq: Two times a day (BID) | SUBCUTANEOUS | Status: DC

## 2022-05-19 MED ORDER — INSULIN ASPART PROT & ASPART (70-30 MIX) 100 UNIT/ML ~~LOC~~ SUSP
40.0000 [IU] | Freq: Two times a day (BID) | SUBCUTANEOUS | Status: DC
  Administered 2022-05-19: 40 [IU] via SUBCUTANEOUS

## 2022-05-19 MED ORDER — INSULIN ASPART PROT & ASPART (70-30 MIX) 100 UNIT/ML ~~LOC~~ SUSP
10.0000 [IU] | SUBCUTANEOUS | Status: AC
  Administered 2022-05-19: 10 [IU] via SUBCUTANEOUS

## 2022-05-19 NOTE — Progress Notes (Signed)
Subjective:  He is eager to go home   Antibiotics:  Anti-infectives (From admission, onward)    Start     Dose/Rate Route Frequency Ordered Stop   05/17/22 2100  ceFAZolin (ANCEF) IVPB 2g/100 mL premix        2 g 200 mL/hr over 30 Minutes Intravenous Every 8 hours 05/17/22 2000         Medications: Scheduled Meds:  amLODipine  10 mg Oral Daily   Chlorhexidine Gluconate Cloth  6 each Topical Daily   enoxaparin (LOVENOX) injection  40 mg Subcutaneous Q24H   insulin aspart  0-5 Units Subcutaneous QHS   insulin aspart  0-9 Units Subcutaneous TID WC   insulin aspart protamine- aspart  38 Units Subcutaneous BID WC   lisinopril  10 mg Oral Daily   Continuous Infusions:   ceFAZolin (ANCEF) IV 2 g (05/19/22 0604)   lactated ringers 75 mL/hr at 05/19/22 0603   PRN Meds:.dextrose, mouth rinse, mouth rinse    Objective: Weight change:   Intake/Output Summary (Last 24 hours) at 05/19/2022 1252 Last data filed at 05/19/2022 0900 Gross per 24 hour  Intake 2652.36 ml  Output --  Net 2652.36 ml   Blood pressure 120/80, pulse 79, temperature 98.4 F (36.9 C), temperature source Oral, resp. rate 16, height 5\' 11"  (1.803 m), weight 97.7 kg, SpO2 99 %. Temp:  [98.2 F (36.8 C)-98.6 F (37 C)] 98.4 F (36.9 C) (10/22 0420) Pulse Rate:  [79-99] 79 (10/22 0420) Resp:  [11-20] 16 (10/22 0420) BP: (110-139)/(68-84) 120/80 (10/22 0420) SpO2:  [97 %-100 %] 99 % (10/22 0420)  Physical Exam: Physical Exam Constitutional:      Appearance: He is well-developed.  HENT:     Head: Normocephalic and atraumatic.  Eyes:     Conjunctiva/sclera: Conjunctivae normal.  Cardiovascular:     Rate and Rhythm: Normal rate and regular rhythm.     Heart sounds: No murmur heard.    No friction rub. No gallop.  Pulmonary:     Effort: Pulmonary effort is normal. No respiratory distress.     Breath sounds: Normal breath sounds. No stridor. No wheezing.  Abdominal:     General: There  is no distension.     Palpations: Abdomen is soft.  Musculoskeletal:        General: Normal range of motion.     Cervical back: Normal range of motion and neck supple.  Skin:    General: Skin is warm and dry.     Findings: No erythema or rash.  Neurological:     General: No focal deficit present.     Mental Status: He is alert and oriented to person, place, and time.  Psychiatric:        Mood and Affect: Mood normal.        Behavior: Behavior normal.        Thought Content: Thought content normal.        Judgment: Judgment normal.      CBC:    BMET Recent Labs    05/18/22 1133 05/18/22 2123 05/19/22 1006  NA 132*  --  131*  K 3.4*  --  4.0  CL 102  --  100  CO2 25  --  26  GLUCOSE 207* 413* 411*  BUN 14  --  15  CREATININE 0.82  --  0.98  CALCIUM 8.6*  --  8.5*     Liver Panel  Recent Labs  05/16/22 2126  PROT 7.2  ALBUMIN 3.9  AST 16  ALT 15  ALKPHOS 70  BILITOT 1.4*       Sedimentation Rate No results for input(s): "ESRSEDRATE" in the last 72 hours. C-Reactive Protein No results for input(s): "CRP" in the last 72 hours.  Micro Results: Recent Results (from the past 720 hour(s))  MRSA Next Gen by PCR, Nasal     Status: None   Collection Time: 05/08/22  4:33 PM   Specimen: Nasal Mucosa; Nasal Swab  Result Value Ref Range Status   MRSA by PCR Next Gen NOT DETECTED NOT DETECTED Final    Comment: (NOTE) The GeneXpert MRSA Assay (FDA approved for NASAL specimens only), is one component of a comprehensive MRSA colonization surveillance program. It is not intended to diagnose MRSA infection nor to guide or monitor treatment for MRSA infections. Test performance is not FDA approved in patients less than 44 years old. Performed at [facility], [place] [address] [place], [place] [address]   Culture, blood (routine x 2)     Status: Abnormal   Collection Time: 05/16/22  9:21 PM   Specimen: BLOOD  Result Value Ref Range Status    Specimen Description   Final    BLOOD SITE NOT SPECIFIED Performed at [facility] [address], [place], [place] [address]    Special Requests   Final    BOTTLES DRAWN AEROBIC AND ANAEROBIC Blood Culture adequate volume Performed at [facility] [address], [place], [place] [address]    Culture  Setup Time   Final    GRAM POSITIVE COCCI IN CLUSTERS ANAEROBIC BOTTLE ONLY CRITICAL VALUE NOTED.  VALUE IS CONSISTENT WITH PREVIOUSLY REPORTED AND CALLED VALUE.    Culture (A)  Final    STAPHYLOCOCCUS AUREUS SUSCEPTIBILITIES PERFORMED ON PREVIOUS CULTURE WITHIN THE LAST 5 DAYS. Performed at [facility], [place] [address], [place], [place] [address]    Report Status 05/19/2022 FINAL  Final  Culture, blood (routine x 2)     Status: Abnormal   Collection Time: 05/17/22  1:02 AM   Specimen: BLOOD  Result Value Ref Range Status   Specimen Description   Final    BLOOD SITE NOT SPECIFIED Performed at [facility] [address], [place], [place] [address]    Special Requests   Final    BOTTLES DRAWN AEROBIC AND ANAEROBIC Blood Culture adequate volume Performed at [facility] [address], [place], [place] [address]    Culture  Setup Time   Final    GRAM POSITIVE COCCI IN CLUSTERS IN BOTH AEROBIC AND ANAEROBIC BOTTLES Organism ID to follow CRITICAL RESULT CALLED TO, READ BACK BY AND VERIFIED WITH: PHARMD DDoy Mince DT:[id number] FH Performed at [facility], [place] [address], [place], [place] [address]    Culture STAPHYLOCOCCUS AUREUS (A)  Final   Report Status 05/19/2022 FINAL  Final   Organism ID, Bacteria STAPHYLOCOCCUS AUREUS  Final      Susceptibility   Staphylococcus aureus - MIC*    CIPROFLOXACIN <=0.5 SENSITIVE Sensitive     ERYTHROMYCIN <=0.25 SENSITIVE Sensitive     GENTAMICIN <=0.5 SENSITIVE Sensitive     OXACILLIN <=0.25 SENSITIVE Sensitive     TETRACYCLINE <=1 SENSITIVE  Sensitive     VANCOMYCIN 1 SENSITIVE Sensitive     TRIMETH/SULFA <=10 SENSITIVE Sensitive     CLINDAMYCIN <=0.25 SENSITIVE Sensitive     RIFAMPIN <=0.5 SENSITIVE  Sensitive     Inducible Clindamycin NEGATIVE Sensitive     * STAPHYLOCOCCUS AUREUS  Blood Culture ID Panel (Reflexed)     Status: Abnormal   Collection Time: 05/17/22  1:02 AM  Result Value Ref Range Status   Enterococcus faecalis NOT DETECTED NOT DETECTED Final   Enterococcus Faecium NOT DETECTED NOT DETECTED Final   Listeria monocytogenes NOT DETECTED NOT DETECTED Final   Staphylococcus species DETECTED (A) NOT DETECTED Final    Comment: CRITICAL RESULT CALLED TO, READ BACK BY AND VERIFIED WITH: PHARMD D. WOFFORD [id number] FH    Staphylococcus aureus (BCID) DETECTED (A) NOT DETECTED Final    Comment: CRITICAL RESULT CALLED TO, READ BACK BY AND VERIFIED WITH: PHARMD D. WOFFORD [id number] 1902 FH    Staphylococcus epidermidis NOT DETECTED NOT DETECTED Final   Staphylococcus lugdunensis NOT DETECTED NOT DETECTED Final   Streptococcus species NOT DETECTED NOT DETECTED Final   Streptococcus agalactiae NOT DETECTED NOT DETECTED Final   Streptococcus pneumoniae NOT DETECTED NOT DETECTED Final   Streptococcus pyogenes NOT DETECTED NOT DETECTED Final   A.calcoaceticus-baumannii NOT DETECTED NOT DETECTED Final   Bacteroides fragilis NOT DETECTED NOT DETECTED Final   Enterobacterales NOT DETECTED NOT DETECTED Final   Enterobacter cloacae complex NOT DETECTED NOT DETECTED Final   Escherichia coli NOT DETECTED NOT DETECTED Final   Klebsiella aerogenes NOT DETECTED NOT DETECTED Final   Klebsiella oxytoca NOT DETECTED NOT DETECTED Final   Klebsiella pneumoniae NOT DETECTED NOT DETECTED Final   Proteus species NOT DETECTED NOT DETECTED Final   Salmonella species NOT DETECTED NOT DETECTED Final   Serratia marcescens NOT DETECTED NOT DETECTED Final   Haemophilus influenzae NOT DETECTED NOT DETECTED Final   Neisseria meningitidis NOT  DETECTED NOT DETECTED Final   Pseudomonas aeruginosa NOT DETECTED NOT DETECTED Final   Stenotrophomonas maltophilia NOT DETECTED NOT DETECTED Final   Candida albicans NOT DETECTED NOT DETECTED Final   Candida auris NOT DETECTED NOT DETECTED Final   Candida glabrata NOT DETECTED NOT DETECTED Final   Candida krusei NOT DETECTED NOT DETECTED Final   Candida parapsilosis NOT DETECTED NOT DETECTED Final   Candida tropicalis NOT DETECTED NOT DETECTED Final   Cryptococcus neoformans/gattii NOT DETECTED NOT DETECTED Final   Meth resistant mecA/C and MREJ NOT DETECTED NOT DETECTED Final    Comment: Performed at [facility], [address], [place], [place] [address]  MRSA Next Gen by PCR, Nasal     Status: Abnormal   Collection Time: 05/17/22  1:37 AM   Specimen: Nasal Mucosa; Nasal Swab  Result Value Ref Range Status   MRSA by PCR Next Gen DETECTED (A) NOT DETECTED Final    Comment: (NOTE) The GeneXpert MRSA Assay (FDA approved for NASAL specimens only), is one component of a comprehensive MRSA colonization surveillance program. It is not intended to diagnose MRSA infection nor to guide or monitor treatment for MRSA infections. Test performance is not FDA approved in patients less than 43 years old. Performed at [facility], [address], [place], [place] [address]   Culture, blood (Routine X 2) w Reflex to ID Panel     Status: None (Preliminary result)   Collection Time: 05/18/22  3:03 PM   Specimen: BLOOD  Result Value Ref Range Status   Specimen Description   Final    BLOOD BLOOD LEFT HAND Performed at [facility], [address] [place], [place] [address]    Special Requests   Final  BOTTLES DRAWN AEROBIC ONLY Blood Culture adequate volume Performed at [facility] [address], [place], [place] [address]    Culture   Final    NO GROWTH < 12 HOURS Performed at [facility] [address],  [place], [place] [address]    Report Status PENDING  Incomplete  Culture, blood (Routine X 2) w Reflex to ID Panel     Status: None (Preliminary result)   Collection Time: 05/18/22  3:08 PM   Specimen: BLOOD  Result Value Ref Range Status   Specimen Description   Final    BLOOD BLOOD LEFT HAND Performed at [facility] [address] [place], [place] [address]    Special Requests   Final    BOTTLES DRAWN AEROBIC ONLY Blood Culture adequate volume Performed at [facility] [address], [place], [place] [address]    Culture   Final    NO GROWTH < 12 HOURS Performed at [facility] [address], [place], [place] [address]    Report Status PENDING  Incomplete    Studies/Results: ECHOCARDIOGRAM COMPLETE  Result Date: 05/18/2022    ECHOCARDIOGRAM REPORT   Patient Name:   DURWIN DAVISSON Date of Exam: 05/18/2022 Medical Rec #:  [id number]      Height:       71.0 in Accession #:    [id number]     Weight:       215.4 lb Date of Birth:  06-07-1986      BSA:          2.176 m Patient Age:    35 years       BP:           144/94 mmHg Patient Gender: M              HR:           81 bpm. Exam Location:  Inpatient Procedure: 2D Echo Indications:    Bacteremia  History:        Patient has no prior history of Echocardiogram examinations.                 Risk Factors:Hypertension and Diabetes.  Sonographer:    Harvie Junior Referring Phys: [place]  1. Left ventricular ejection fraction, by estimation, is 60 to 65%. The left ventricle has normal function. The left ventricle has no regional wall motion abnormalities. Left ventricular diastolic parameters were normal. There is the interventricular septum is flattened in systole, consistent with right ventricular pressure overload.  2. Right ventricular systolic function is normal. The right ventricular size is normal. There is normal pulmonary artery systolic pressure.  Conclusion(s)/Recommendation(s): No evidence of valvular vegetations on this transthoracic echocardiogram. Consider a transesophageal echocardiogram to exclude infective endocarditis if clinically indicated. FINDINGS  Left Ventricle: Left ventricular ejection fraction, by estimation, is 60 to 65%. The left ventricle has normal function. The left ventricle has no regional wall motion abnormalities. The left ventricular internal cavity size was normal in size. There is  no left ventricular hypertrophy. The interventricular septum is flattened in systole, consistent with right ventricular pressure overload. Left ventricular diastolic parameters were normal. Right Ventricle: The right ventricular size is normal. No increase in right ventricular wall thickness. Right ventricular systolic function is normal. There is normal pulmonary artery systolic pressure. The tricuspid regurgitant velocity is 1.93 m/s, and  with an assumed right atrial pressure of 3 mmHg, the  estimated right ventricular systolic pressure is 99991111 mmHg. Left Atrium: Left atrial size was normal in size. Right Atrium: Right atrial size was normal in size. Pericardium: There is no evidence of pericardial effusion. Mitral Valve: The mitral valve is normal in structure. No evidence of mitral valve regurgitation. No evidence of mitral valve stenosis. Tricuspid Valve: The tricuspid valve is grossly normal. Tricuspid valve regurgitation is not demonstrated. No evidence of tricuspid stenosis. Aortic Valve: The aortic valve is normal in structure. Aortic valve regurgitation is not visualized. No aortic stenosis is present. Aortic valve mean gradient measures 2.0 mmHg. Aortic valve peak gradient measures 4.1 mmHg. Aortic valve area, by VTI measures 2.99 cm. Pulmonic Valve: The pulmonic valve was not well visualized. Pulmonic valve regurgitation is trivial. No evidence of pulmonic stenosis. Aorta: The aortic root is normal in size and structure. Venous: The  inferior vena cava is normal in size with greater than 50% respiratory variability, suggesting right atrial pressure of 3 mmHg.  LEFT VENTRICLE PLAX 2D LVIDd:         4.35 cm      Diastology LVIDs:         2.90 cm      LV e' medial:    6.20 cm/s LV PW:         1.10 cm      LV E/e' medial:  8.8 LV IVS:        1.05 cm      LV e' lateral:   10.80 cm/s LVOT diam:     2.40 cm      LV E/e' lateral: 5.0 LV SV:         48 LV SV Index:   22 LVOT Area:     4.52 cm  LV Volumes (MOD) LV vol d, MOD A2C: 94.8 ml LV vol d, MOD A4C: 111.0 ml LV vol s, MOD A2C: 38.3 ml LV vol s, MOD A4C: 42.2 ml LV SV MOD A2C:     56.5 ml LV SV MOD A4C:     111.0 ml LV SV MOD BP:      64.7 ml RIGHT VENTRICLE RV Basal diam:  3.30 cm RV Mid diam:    2.90 cm RV S prime:     12.00 cm/s TAPSE (M-mode): 2.8 cm LEFT ATRIUM             Index        RIGHT ATRIUM           Index LA diam:        2.90 cm 1.33 cm/m   RA Area:     13.20 cm LA Vol (A2C):   23.6 ml 10.85 ml/m  RA Volume:   27.80 ml  12.78 ml/m LA Vol (A4C):   32.8 ml 15.07 ml/m LA Biplane Vol: 29.5 ml 13.56 ml/m  AORTIC VALVE                    PULMONIC VALVE AV Area (Vmax):    2.92 cm     PV Vmax:          1.07 m/s AV Area (Vmean):   2.97 cm     PV Peak grad:     4.6 mmHg AV Area (VTI):     2.99 cm     PR End Diast Vel: 4.08 msec AV Vmax:           101.00 cm/s AV Vmean:          66.200  cm/s AV VTI:            0.162 m AV Peak Grad:      4.1 mmHg AV Mean Grad:      2.0 mmHg LVOT Vmax:         65.10 cm/s LVOT Vmean:        43.500 cm/s LVOT VTI:          0.107 m LVOT/AV VTI ratio: 0.66  AORTA Ao Root diam: 3.50 cm Ao Asc diam:  3.70 cm MITRAL VALVE               TRICUSPID VALVE MV Area (PHT): 2.99 cm    TR Peak grad:   14.9 mmHg MV Decel Time: 254 msec    TR Vmax:        193.00 cm/s MV E velocity: 54.40 cm/s MV A velocity: 55.00 cm/s  SHUNTS MV E/A ratio:  0.99        Systemic VTI:  0.11 m                            Systemic Diam: 2.40 cm Vishnu Priya Mallipeddi Electronically signed by  Lorelee Cover Mallipeddi Signature Date/Time: 05/18/2022/3:16:33 PM    Final       Assessment/Plan:  INTERVAL HISTORY: unfortunately  blood cultures ordered 05/17/22  also coming back +1 of 2 sites with MSSA   Principal Problem:   MSSA bacteremia Active Problems:   HTN (hypertension)   AKI (acute kidney injury) ([place])   DM2 (diabetes mellitus, type 2) ([place])   Hyperosmolar hyperglycemic state (HHS) ([place])    [name] [name] is a 36 y.o. male with controlled diabetes mellitus with admission for DKA/HHS with AKI.  He was found to have methicillin sensitive Staphylococcus aureus growing from 1 of 2 blood cultures taken on the 19th and then again on the 20th--though they may have both been before he was started on ancef  #1 MSSA bacteremia:  Unclear source  TTE negative  Repeat blood cultures yesterday after being on antibiotics negative SO FAR  I think given that we do not know of source of infection and have not excluded deep infection with + blood cultures in 2 consecutive days we need to get TEE to exclude endocarditis  Certainly at minimum we need to prove we are clearing his bacteremia prior to ability of discharging him on oral antibiotics.  I spent 53 minutes with the patient including than 50% of the time in face to face counseling of the patient personally reviewing 2D echocardiogram along with review of medical records in preparation for the visit and during the visit and in coordination of his care.   Dr. Gale Journey will be here tomorrow.   LOS: 1 day   Alcide Evener 05/19/2022, 12:52 PM

## 2022-05-19 NOTE — Progress Notes (Signed)
PROGRESS NOTE [name] [name]  CHY:[id number] DOB: Feb 09, 1986 DOA: 05/16/2022 PCP: Pcp, No   Brief Narrative/Hospital Course: 36 y.o. male with diabetes mellitus on insulin with previous history of DKA and HHS, hypertension admitted with working diagnosis of diabetes with HHS   He presented with body aches headaches and concern for hyperglycemia given previous similar presentation. He has a recurrent issue with poor diabetes control and admissions for DKA/HHS.  Last noted to have HHS on the 13th of this month.He states that he is taking his insulin but unsure of his dose. In the ED mild tachycardia, labs with sodium 128 which corrects considering glucose of 797, chloride 89, creatinine elevated 1.56 and baseline of 0.9, T. bili 1.4.  CBC within normal limits.  Lactic acid normal.  Troponin normal ,Urinalysis, UDS, blood cultures sent Beta hydroxybutyric acid elevated to 1.0.  Ethanol level negative.  VBG with normal pH and normal PCO2.  Chest x-ray -hypoventilatory chest. Patient was placed on IV fluid hydration, insulin drip and admitted. Patient was subsequently transition to 7030 regimen He was found to have MSSA bacteremia ID was consulted    Subjective: Seen and examined this morning Sleeping did wake up on calling  Reports his sugar has been running high separates from insulin, does not believe diet makes sugar go up Overnight afebrile, blood sugar remains poorly controlled nursing reports he has been eating "a lot and asking snacks in between, but somewhat irritated when asked to be compliant"   Assessment and Plan: Active Problems:   HTN (hypertension)   AKI (acute kidney injury) (HCC)   DM2 (diabetes mellitus, type 2) (HCC)   Hyperosmolar hyperglycemic state (HHS) (HCC)  DM on long-term insulin with uncontrolled hyperglycemia HHS Recurrent similar admissions: PTA home 70/30 Novolin 28 units bid and ssi.Anion gap normal on admission w/ cbg in 700.  Blood sugar was improving but  now with dietary noncompliance, sugar is poorly controlled increasing to 38 units twice daily continue SSI- and assess to adjust-extensively discussed the role of dietary compliance  in diabetic management.TOC provided coupon-patient has indicated that he will get his insulin from [facility]  upon discharge.   Recent Labs  Lab 05/18/22 0812 05/18/22 1212 05/18/22 1631 05/18/22 2046 05/19/22 0737  GLUCAP 200* 183* 303* 421* 328*    MSSA bacteremia 1/2 blood culture sets 05/17/22 placed on Ancef, ID following, TTE showed EF 60 to 65% right ventricular pressure overload normal RV systolic function no valvular vegetation.  Blood culture sent 05/18/22.  Does not appear to have a clinical evidence of deep infection source unclear await further ID plan for outpatient regimen  AKI: Due to HHS resolved Recent Labs  Lab 05/17/22 0054 05/17/22 0512 05/17/22 1701 05/18/22 1133 05/19/22 1006  BUN 18 18 17 14 15   CREATININE 1.42* 1.05 1.02 0.82 0.98    Hypertension: BP is well controlled on home amlodipine and lisinopril  Class I Obesity:Patient's Body mass index is 30.04 kg/m. : Will benefit with PCP follow-up, weight loss  healthy lifestyle and outpatient sleep evaluation.  DVT prophylaxis: enoxaparin (LOVENOX) injection 40 mg Start: 05/17/22 1000 Code Status:   Code Status: Full Code Family Communication: plan of care discussed with patient at bedside. Patient status is: Observation but remains hospitalized due to uncontrolled hyperglycemia, bacteremia  Level of care: Telemetry  Dispo: The patient is from: home            Anticipated disposition: home once blood sugar stabilizes, and okay with ID  Objective: Vitals last 24  hrs: Vitals:   05/18/22 1600 05/18/22 1626 05/18/22 2042 05/19/22 0420  BP: 114/71 139/84 110/68 120/80  Pulse:  99 86 79  Resp: 15 18 20 16   Temp:  98.2 F (36.8 C) 98.6 F (37 C) 98.4 F (36.9 C)  TempSrc:  Oral Oral Oral  SpO2:  100% 98% 99%  Weight:       Height:       Weight change:   Physical Examination: General exam: AA OX3, obese  HEENT:Oral mucosa moist, Ear/Nose WNL grossly, dentition normal. Respiratory system: bilaterally clear BS, no use of accessory muscle Cardiovascular system: S1 & S2 +, regular rate. Gastrointestinal system: Abdomen soft, NT,ND,BS+ Nervous System:Alert, awake, moving extremities and grossly nonfocal Extremities: LE ankle edema neg, lower extremities warm Skin: No rashes,no icterus. MSK: Normal muscle bulk,tone, power   Medications reviewed:  Scheduled Meds:  amLODipine  10 mg Oral Daily   Chlorhexidine Gluconate Cloth  6 each Topical Daily   enoxaparin (LOVENOX) injection  40 mg Subcutaneous Q24H   insulin aspart  0-5 Units Subcutaneous QHS   insulin aspart  0-9 Units Subcutaneous TID WC   insulin aspart protamine- aspart  10 Units Subcutaneous STAT   insulin aspart protamine- aspart  38 Units Subcutaneous BID WC   lisinopril  10 mg Oral Daily  Continuous Infusions:   ceFAZolin (ANCEF) IV 2 g (05/19/22 0604)   lactated ringers 75 mL/hr at 05/19/22 0603   Unresulted Labs (From admission, onward)     Start     Ordered   05/23/22 0500  Creatinine, serum  (enoxaparin (LOVENOX)    CrCl >/= 30 ml/min)  Weekly,   R     Comments: while on enoxaparin therapy    05/16/22 2320   05/20/22 0500  Basic metabolic panel  Daily at 5am,   R     Question:  Specimen collection method  Answer:  Lab=Lab collect   05/19/22 0836          Data Reviewed: I have personally reviewed following labs and imaging studies CBC: Recent Labs  Lab 05/16/22 2126 05/16/22 2221  WBC 5.3  --   NEUTROABS 2.8  --   HGB 15.2 15.6  HCT 44.1 46.0  MCV 81.4  --   PLT 241  --    Basic Metabolic Panel: Recent Labs  Lab 05/17/22 0054 05/17/22 0512 05/17/22 1701 05/18/22 1133 05/18/22 2123 05/19/22 1006  NA 129* 136 129* 132*  --  131*  K 4.7 4.0 3.5 3.4*  --  4.0  CL 94* 103 98 102  --  100  CO2 26 26 22 25   --  26   GLUCOSE 624* 150* 352* 207* 413* 411*  BUN 18 18 17 14   --  15  CREATININE 1.42* 1.05 1.02 0.82  --  0.98  CALCIUM 8.7* 9.3 8.4* 8.6*  --  8.5*    Antimicrobials: Anti-infectives (From admission, onward)    Start     Dose/Rate Route Frequency Ordered Stop   05/17/22 2100  ceFAZolin (ANCEF) IVPB 2g/100 mL premix        2 g 200 mL/hr over 30 Minutes Intravenous Every 8 hours 05/17/22 2000        Culture/Microbiology    Component Value Date/Time   SDES  05/18/2022 1508    BLOOD BLOOD LEFT HAND Performed at [facility], [address] [place], [place] [address]    SPECREQUEST  05/18/2022 1508    BOTTLES DRAWN AEROBIC ONLY Blood Culture  adequate volume Performed at [facility], [address] [place], [place] [address]    CULT  05/18/2022 1508    NO GROWTH < 12 HOURS Performed at [facility], [address] [place], [place] [address]    REPTSTATUS PENDING 05/18/2022 1508    Radiology Studies: ECHOCARDIOGRAM COMPLETE  Result Date: 05/18/2022    ECHOCARDIOGRAM REPORT   Patient Name:   [name] [name] Date of Exam: 05/18/2022 Medical Rec #:  [id number]      Height:       71.0 in Accession #:    [id number]     Weight:       215.4 lb Date of Birth:  1985-09-20      BSA:          2.176 m Patient Age:    36 years       BP:           144/94 mmHg Patient Gender: M              HR:           81 bpm. Exam Location:  Inpatient Procedure: 2D Echo Indications:    Bacteremia  History:        Patient has no prior history of Echocardiogram examinations.                 Risk Factors:Hypertension and Diabetes.  Sonographer:    Cathie Hoops Referring Phys: [phone] CORNELIUS N VAN DAM IMPRESSIONS  1. Left ventricular ejection fraction, by estimation, is 60 to 65%. The left ventricle has normal function. The left ventricle has no regional wall motion abnormalities. Left ventricular diastolic parameters were normal. There is the interventricular septum is  flattened in systole, consistent with right ventricular pressure overload.  2. Right ventricular systolic function is normal. The right ventricular size is normal. There is normal pulmonary artery systolic pressure. Conclusion(s)/Recommendation(s): No evidence of valvular vegetations on this transthoracic echocardiogram. Consider a transesophageal echocardiogram to exclude infective endocarditis if clinically indicated. FINDINGS  Left Ventricle: Left ventricular ejection fraction, by estimation, is 60 to 65%. The left ventricle has normal function. The left ventricle has no regional wall motion abnormalities. The left ventricular internal cavity size was normal in size. There is  no left ventricular hypertrophy. The interventricular septum is flattened in systole, consistent with right ventricular pressure overload. Left ventricular diastolic parameters were normal. Right Ventricle: The right ventricular size is normal. No increase in right ventricular wall thickness. Right ventricular systolic function is normal. There is normal pulmonary artery systolic pressure. The tricuspid regurgitant velocity is 1.93 m/s, and  with an assumed right atrial pressure of 3 mmHg, the estimated right ventricular systolic pressure is 17.9 mmHg. Left Atrium: Left atrial size was normal in size. Right Atrium: Right atrial size was normal in size. Pericardium: There is no evidence of pericardial effusion. Mitral Valve: The mitral valve is normal in structure. No evidence of mitral valve regurgitation. No evidence of mitral valve stenosis. Tricuspid Valve: The tricuspid valve is grossly normal. Tricuspid valve regurgitation is not demonstrated. No evidence of tricuspid stenosis. Aortic Valve: The aortic valve is normal in structure. Aortic valve regurgitation is not visualized. No aortic stenosis is present. Aortic valve mean gradient measures 2.0 mmHg. Aortic valve peak gradient measures 4.1 mmHg. Aortic valve area, by VTI measures  2.99 cm. Pulmonic Valve: The pulmonic valve was not well visualized. Pulmonic valve regurgitation is trivial. No evidence of pulmonic stenosis. Aorta: The aortic  root is normal in size and structure. Venous: The inferior vena cava is normal in size with greater than 50% respiratory variability, suggesting right atrial pressure of 3 mmHg.  LEFT VENTRICLE PLAX 2D LVIDd:         4.35 cm      Diastology LVIDs:         2.90 cm      LV e' medial:    6.20 cm/s LV PW:         1.10 cm      LV E/e' medial:  8.8 LV IVS:        1.05 cm      LV e' lateral:   10.80 cm/s LVOT diam:     2.40 cm      LV E/e' lateral: 5.0 LV SV:         48 LV SV Index:   22 LVOT Area:     4.52 cm  LV Volumes (MOD) LV vol d, MOD A2C: 94.8 ml LV vol d, MOD A4C: 111.0 ml LV vol s, MOD A2C: 38.3 ml LV vol s, MOD A4C: 42.2 ml LV SV MOD A2C:     56.5 ml LV SV MOD A4C:     111.0 ml LV SV MOD BP:      64.7 ml RIGHT VENTRICLE RV Basal diam:  3.30 cm RV Mid diam:    2.90 cm RV S prime:     12.00 cm/s TAPSE (M-mode): 2.8 cm LEFT ATRIUM             Index        RIGHT ATRIUM           Index LA diam:        2.90 cm 1.33 cm/m   RA Area:     13.20 cm LA Vol (A2C):   23.6 ml 10.85 ml/m  RA Volume:   27.80 ml  12.78 ml/m LA Vol (A4C):   32.8 ml 15.07 ml/m LA Biplane Vol: 29.5 ml 13.56 ml/m  AORTIC VALVE                    PULMONIC VALVE AV Area (Vmax):    2.92 cm     PV Vmax:          1.07 m/s AV Area (Vmean):   2.97 cm     PV Peak grad:     4.6 mmHg AV Area (VTI):     2.99 cm     PR End Diast Vel: 4.08 msec AV Vmax:           101.00 cm/s AV Vmean:          66.200 cm/s AV VTI:            0.162 m AV Peak Grad:      4.1 mmHg AV Mean Grad:      2.0 mmHg LVOT Vmax:         65.10 cm/s LVOT Vmean:        43.500 cm/s LVOT VTI:          0.107 m LVOT/AV VTI ratio: 0.66  AORTA Ao Root diam: 3.50 cm Ao Asc diam:  3.70 cm MITRAL VALVE               TRICUSPID VALVE MV Area (PHT): 2.99 cm    TR Peak grad:   14.9 mmHg MV Decel Time: 254 msec    TR Vmax:  193.00  cm/s MV E velocity: 54.40 cm/s MV A velocity: 55.00 cm/s  SHUNTS MV E/A ratio:  0.99        Systemic VTI:  0.11 m                            Systemic Diam: 2.40 cm Vishnu Priya Mallipeddi Electronically signed by Winfield Rast Mallipeddi Signature Date/Time: 05/18/2022/3:16:33 PM    Final      LOS: 1 day   Lanae Boast, MD [facility]  05/19/2022, 11:06 AM

## 2022-05-19 NOTE — Plan of Care (Signed)
  Problem: Coping: Goal: Ability to adjust to condition or change in health will improve Outcome: Progressing   

## 2022-05-20 ENCOUNTER — Encounter: Payer: Self-pay | Admitting: Family

## 2022-05-20 DIAGNOSIS — Z09 Encounter for follow-up examination after completed treatment for conditions other than malignant neoplasm: Secondary | ICD-10-CM

## 2022-05-20 DIAGNOSIS — B9561 Methicillin susceptible Staphylococcus aureus infection as the cause of diseases classified elsewhere: Secondary | ICD-10-CM | POA: Diagnosis not present

## 2022-05-20 DIAGNOSIS — Z599 Problem related to housing and economic circumstances, unspecified: Secondary | ICD-10-CM

## 2022-05-20 DIAGNOSIS — E119 Type 2 diabetes mellitus without complications: Secondary | ICD-10-CM

## 2022-05-20 DIAGNOSIS — R7881 Bacteremia: Secondary | ICD-10-CM | POA: Diagnosis not present

## 2022-05-20 DIAGNOSIS — N179 Acute kidney failure, unspecified: Secondary | ICD-10-CM

## 2022-05-20 DIAGNOSIS — R7989 Other specified abnormal findings of blood chemistry: Secondary | ICD-10-CM

## 2022-05-20 DIAGNOSIS — I10 Essential (primary) hypertension: Secondary | ICD-10-CM

## 2022-05-20 DIAGNOSIS — E1100 Type 2 diabetes mellitus with hyperosmolarity without nonketotic hyperglycemic-hyperosmolar coma (NKHHC): Secondary | ICD-10-CM

## 2022-05-20 DIAGNOSIS — Z7689 Persons encountering health services in other specified circumstances: Secondary | ICD-10-CM

## 2022-05-20 LAB — BASIC METABOLIC PANEL
Anion gap: 8 (ref 5–15)
BUN: 16 mg/dL (ref 6–20)
CO2: 23 mmol/L (ref 22–32)
Calcium: 8.9 mg/dL (ref 8.9–10.3)
Chloride: 106 mmol/L (ref 98–111)
Creatinine, Ser: 0.81 mg/dL (ref 0.61–1.24)
GFR, Estimated: >60 mL/min (ref >60)
Glucose, Bld: 87 mg/dL (ref 70–99)
Potassium: 3.7 mmol/L (ref 3.5–5.1)
Sodium: 137 mmol/L (ref 135–145)

## 2022-05-20 LAB — GLUCOSE, CAPILLARY
Glucose-Capillary: 181 mg/dL — ABNORMAL HIGH (ref 70–99)
Glucose-Capillary: 317 mg/dL — ABNORMAL HIGH (ref 70–99)
Glucose-Capillary: 225 mg/dL — ABNORMAL HIGH (ref 70–99)
Glucose-Capillary: 95 mg/dL (ref 70–99)

## 2022-05-20 MED ORDER — INSULIN ASPART PROT & ASPART (70-30 MIX) 100 UNIT/ML ~~LOC~~ SUSP
5.0000 [IU] | SUBCUTANEOUS | Status: AC
  Administered 2022-05-20: 5 [IU] via SUBCUTANEOUS

## 2022-05-20 MED ORDER — INSULIN ASPART PROT & ASPART (70-30 MIX) 100 UNIT/ML ~~LOC~~ SUSP
30.0000 [IU] | Freq: Two times a day (BID) | SUBCUTANEOUS | Status: DC
  Administered 2022-05-20: 30 [IU] via SUBCUTANEOUS

## 2022-05-20 MED ORDER — INSULIN ASPART PROT & ASPART (70-30 MIX) 100 UNIT/ML ~~LOC~~ SUSP
35.0000 [IU] | Freq: Two times a day (BID) | SUBCUTANEOUS | Status: DC
  Administered 2022-05-20 – 2022-05-21 (×2): 35 [IU] via SUBCUTANEOUS

## 2022-05-20 MED ORDER — MUPIROCIN 2 % EX OINT
1.0000 | TOPICAL_OINTMENT | Freq: Two times a day (BID) | CUTANEOUS | Status: DC
  Administered 2022-05-20 – 2022-05-21 (×3): 1 via NASAL
  Filled 2022-05-20: qty 22

## 2022-05-20 NOTE — Progress Notes (Signed)
Subjective: Doing very well Bacteremia had cleared as of 10/21 repeat bcx Planning to take bus to [place] by 10/30 but willing to stay longer to get tee -- he wants to know definitively if he has endocarditis  No joint pain/back pain No other sx  Antibiotics:  Anti-infectives (From admission, onward)    Start     Dose/Rate Route Frequency Ordered Stop   05/17/22 2100  ceFAZolin (ANCEF) IVPB 2g/100 mL premix        2 g 200 mL/hr over 30 Minutes Intravenous Every 8 hours 05/17/22 2000         Medications: Scheduled Meds:  amLODipine  10 mg Oral Daily   Chlorhexidine Gluconate Cloth  6 each Topical Daily   enoxaparin (LOVENOX) injection  40 mg Subcutaneous Q24H   insulin aspart  0-5 Units Subcutaneous QHS   insulin aspart  0-9 Units Subcutaneous TID WC   insulin aspart protamine- aspart  35 Units Subcutaneous BID WC   lisinopril  10 mg Oral Daily   mupirocin ointment  1 Application Nasal BID   Continuous Infusions:   ceFAZolin (ANCEF) IV Stopped (05/20/22 1335)   PRN Meds:.dextrose, mouth rinse, mouth rinse    Objective: Weight change:   Intake/Output Summary (Last 24 hours) at 05/20/2022 1437 Last data filed at 05/20/2022 1348 Gross per 24 hour  Intake 2129.58 ml  Output 480 ml  Net 1649.58 ml    Blood pressure (!) 149/91, pulse 85, temperature 99.3 F (37.4 C), temperature source Oral, resp. rate 20, height 5\' 11"  (1.803 m), weight 97.7 kg, SpO2 100 %. Temp:  [98 F (36.7 C)-99.3 F (37.4 C)] 99.3 F (37.4 C) (10/23 1336) Pulse Rate:  [72-89] 85 (10/23 1336) Resp:  [17-20] 20 (10/23 1336) BP: (121-153)/(83-99) 149/91 (10/23 1336) SpO2:  [98 %-100 %] 100 % (10/23 1336)  Physical Exam: General/constitutional: no distress, pleasant HEENT: Normocephalic, PER, Conj Clear, EOMI, Oropharynx clear Neck supple CV: rrr no mrg Lungs: clear to auscultation, normal respiratory effort Abd: Soft, Nontender Ext: no edema Skin: No Rash Neuro:  nonfocal MSK: no peripheral joint swelling/tenderness/warmth; back spines nontender    LABs: Lab Results  Component Value Date   WBC 5.3 05/16/2022   HGB 15.6 05/16/2022   HCT 46.0 05/16/2022   MCV 81.4 05/16/2022   PLT 241 14/97/0263   Last metabolic panel Lab Results  Component Value Date   GLUCOSE 87 05/20/2022   NA 137 05/20/2022   K 3.7 05/20/2022   CL 106 05/20/2022   CO2 23 05/20/2022   BUN 16 05/20/2022   CREATININE 0.81 05/20/2022   GFRNONAA >60 05/20/2022   CALCIUM 8.9 05/20/2022   PHOS 3.8 05/08/2022   PROT 7.2 05/16/2022   ALBUMIN 3.9 05/16/2022   BILITOT 1.4 (H) 05/16/2022   ALKPHOS 70 05/16/2022   AST 16 05/16/2022   ALT 15 05/16/2022   ANIONGAP 8 05/20/2022    Micro Results: 10/22 bcx ngtd 10/21 bcx ngtd 10/19 and 10/20 bcx mssa  Studies/Results: 10/21 tte No evidence of valvular vegetations on  this transthoracic echocardiogram. Consider a transesophageal  echocardiogram to exclude infective endocarditis if clinically indicated.   Assessment/Plan:  Principal Problem:   MSSA bacteremia Active Problems:   HTN (hypertension)   AKI (acute kidney injury) ([place])   DM2 (diabetes mellitus, type 2) ([place])   Hyperosmolar hyperglycemic state (HHS) ([place])  Abx: 10/20-c cefazolin   [name] [name] is a 36 y.o. male with controlled diabetes mellitus with admission  for DKA/HHS found to have mssa bacteremia of unclear source  He has no localizing metastatic infectious focus 10/19 and 10/20 bcx positive but abx wasn't started until 10/20  He so far clears as of 10/21 TTE negative  He wanted to leave by 10/28 to get on the bus to [place] by 10/30 but willing to stay for tee as he wants to definitively find out if endocarditis had occurred   -await tee  -given recent abx trial IV-->PO, it would be reasonable to transition him to PO abx to finish the course (with or without abx). However, he'll need at least 7 days of intravenous abx from the last  persistently negative bcx -if tee is negative then total 4 weeks of abx, vs 6 for a positive tee  -oral abx can be with linezolid 600 mg po bid, to start no sooner than 05/26/22 -discussed with primary team           Raymondo Band, MD [facility] [phone]  pager   [phone] cell 05/20/2022, 2:44 PM

## 2022-05-20 NOTE — Plan of Care (Signed)
  Problem: Coping: Goal: Ability to adjust to condition or change in health will improve Outcome: Progressing   

## 2022-05-20 NOTE — Progress Notes (Signed)
PROGRESS NOTE [name] [name]  [id number] DOB: 19-Jul-1986 DOA: 05/16/2022 PCP: Pcp, No   Brief Narrative/Hospital Course: 36 y.o. male with diabetes mellitus on insulin with previous history of DKA and HHS, hypertension admitted with working diagnosis of diabetes with HHS   He presented with body aches headaches and concern for hyperglycemia given previous similar presentation. He has a recurrent issue with poor diabetes control and admissions for DKA/HHS.  Last noted to have HHS on the 13th of this month.He states that he is taking his insulin but unsure of his dose. In the ED mild tachycardia, labs with sodium 128 which corrects considering glucose of 797, chloride 89, creatinine elevated 1.56 and baseline of 0.9, T. bili 1.4.  CBC within normal limits.  Lactic acid normal.  Troponin normal ,Urinalysis, UDS, blood cultures sent Beta hydroxybutyric acid elevated to 1.0.  Ethanol level negative.  VBG with normal pH and normal PCO2.  Chest x-ray -hypoventilatory chest. Patient was placed on IV fluid hydration, insulin drip and admitted. Patient was subsequently transition to 7030 regimen He was found to have MSSA bacteremia ID was consulted    Subjective: Seen and examined.  On the bedside. Overnight afebrile  Blood sugar improved to 180s    Assessment and Plan: Principal Problem:   MSSA bacteremia Active Problems:   HTN (hypertension)   AKI (acute kidney injury) ([place])   DM2 (diabetes mellitus, type 2) (HCC)   Hyperosmolar hyperglycemic state (HHS) ([place])  DM on long-term insulin with uncontrolled hyperglycemia HHS Recurrent similar admissions: PTA home 70/30 Novolin 28 units bid and ssi.Anion gap normal on admission w/ cbg in 700.  Blood sugar was improving but now with dietary noncompliance, sugar is poorly controlled> adjusted insulin regimen further and extensive discussion for dietary compliance, asked nursing staff to avoid snacks in between meals.  Blood sugar now 90-180s> cut  to 30 u mix inuslin bid> adjust to 35 u as cbg uptrending again cont ssi.TOC provided coupon-patient has indicated that he will get his insulin from [facility]  upon discharge.   Recent Labs  Lab 05/19/22 1158 05/19/22 1609 05/19/22 2050 05/20/22 0747 05/20/22 1241  GLUCAP 359* 94 122* 181* 317*    MSSA bacteremia 05/17/22: Unclear source, TTE negative.  Repeat blood culture 04/2019 negative so far, deep infection not excluded with + blood cultures in 2 consecutive days ID advised TEE> message sent to  Wannetta Sender- she will try to put him in a schedule   AKI: Due to HHS resolved Recent Labs  Lab 05/17/22 0512 05/17/22 1701 05/18/22 1133 05/19/22 1006 05/20/22 0333  BUN 18 17 14 15 16   CREATININE 1.05 1.02 0.82 0.98 0.81    Hypertension: Controlled on amlodipine and lisinopril  Class I Obesity:Patient's Body mass index is 30.04 kg/m. : Will benefit with PCP follow-up, weight loss  healthy lifestyle and outpatient sleep evaluation.  DVT prophylaxis: enoxaparin (LOVENOX) injection 40 mg Start: 05/17/22 1000 Code Status:   Code Status: Full Code Family Communication: plan of care discussed with patient at bedside. Patient status is: Observation but remains hospitalized due to uncontrolled hyperglycemia, bacteremia  Level of care: Telemetry  Dispo: The patient is from: home            Anticipated disposition: home once blood sugar stabilizes, and okay with ID pendinG TEE  Objective: Vitals last 24 hrs: Vitals:   05/19/22 1334 05/19/22 2111 05/20/22 0458 05/20/22 0916  BP: 125/69 121/83 (!) 153/99 (!) 148/92  Pulse: 90 72 72 89  Resp: 17  18 18 17   Temp: 97.8 F (36.6 C) 98 F (36.7 C) 98 F (36.7 C) 98.8 F (37.1 C)  TempSrc: Oral Oral Oral Oral  SpO2: 96%  100% 98%  Weight:      Height:       Weight change:   Physical Examination: General exam: alert awake,oriented older than stated age HEENT:Oral mucosa moist, Ear/Nose WNL grossly Respiratory system: Bilaterally clear BS,  no use of accessory muscle Cardiovascular system: S1 & S2 +, No JVD. Gastrointestinal system: Abdomen soft,NT,ND, BS+ Nervous System: Alert, awake, moving extremities, nonfocal on exam.  Extremities: LE edema neg,distal peripheral pulses palpable.  Skin: No rashes,no icterus. MSK: Normal muscle bulk,tone, power   Medications reviewed:  Scheduled Meds:  amLODipine  10 mg Oral Daily   Chlorhexidine Gluconate Cloth  6 each Topical Daily   enoxaparin (LOVENOX) injection  40 mg Subcutaneous Q24H   insulin aspart  0-5 Units Subcutaneous QHS   insulin aspart  0-9 Units Subcutaneous TID WC   insulin aspart protamine- aspart  30 Units Subcutaneous BID WC   lisinopril  10 mg Oral Daily   mupirocin ointment  1 Application Nasal BID  Continuous Infusions:   ceFAZolin (ANCEF) IV 2 g (05/20/22 1305)   Unresulted Labs (From admission, onward)     Start     Ordered   05/23/22 0500  Creatinine, serum  (enoxaparin (LOVENOX)    CrCl >/= 30 ml/min)  Weekly,   R     Comments: while on enoxaparin therapy    05/16/22 2320   05/20/22 [id number]  Basic metabolic panel  Daily at 5am,   R     Question:  Specimen collection method  Answer:  Lab=Lab collect   05/19/22 0836          Data Reviewed: I have personally reviewed following labs and imaging studies CBC: Recent Labs  Lab 05/16/22 2126 05/16/22 2221  WBC 5.3  --   NEUTROABS 2.8  --   HGB 15.2 15.6  HCT 44.1 46.0  MCV 81.4  --   PLT 241  --    Basic Metabolic Panel: Recent Labs  Lab 05/17/22 0512 05/17/22 1701 05/18/22 1133 05/18/22 2123 05/19/22 1006 05/20/22 0333  NA 136 129* 132*  --  131* 137  K 4.0 3.5 3.4*  --  4.0 3.7  CL 103 98 102  --  100 106  CO2 26 22 25   --  26 23  GLUCOSE 150* 352* 207* 413* 411* 87  BUN 18 17 14   --  15 16  CREATININE 1.05 1.02 0.82  --  0.98 0.81  CALCIUM 9.3 8.4* 8.6*  --  8.5* 8.9    Antimicrobials: Anti-infectives (From admission, onward)    Start     Dose/Rate Route Frequency Ordered Stop    05/17/22 2100  ceFAZolin (ANCEF) IVPB 2g/100 mL premix        2 g 200 mL/hr over 30 Minutes Intravenous Every 8 hours 05/17/22 2000        Culture/Microbiology    Component Value Date/Time   SDES  05/19/2022 1356    BLOOD BLOOD LEFT HAND Performed at [facility], [place] [address] [place], [place] [address]    SDES  05/19/2022 1356    BLOOD LEFT ANTECUBITAL Performed at [facility], [place] [address], [place], [place] [address]    SPECREQUEST  05/19/2022 1356    BOTTLES DRAWN AEROBIC ONLY Blood Culture adequate volume Performed at [facility], [place] [address] [place], [place] [address]    SPECREQUEST  05/19/2022 1356    BOTTLES DRAWN AEROBIC ONLY Blood Culture adequate volume Performed at [place] [address] [place], [place] [address]    CULT  05/19/2022 1356    NO GROWTH < 24 HOURS Performed at [place] [address], [place], [place] [address]    CULT  05/19/2022 1356    NO GROWTH < 24 HOURS Performed at [place] [address], [place], [place] [address]    REPTSTATUS PENDING 05/19/2022 1356   REPTSTATUS PENDING 05/19/2022 1356    Radiology Studies: No results found.   LOS: 2 days   Antonieta Pert, MD [facility]  05/20/2022, 1:35 PM

## 2022-05-20 NOTE — Plan of Care (Signed)
  Problem: Education: Goal: Ability to describe self-care measures that may prevent or decrease complications (Diabetes Survival Skills Education) will improve Outcome: Progressing   Problem: Health Behavior/Discharge Planning: Goal: Ability to identify and utilize available resources and services will improve Outcome: Progressing   Problem: Fluid Volume: Goal: Ability to achieve a balanced intake and output will improve Outcome: Progressing   Problem: Metabolic: Goal: Ability to maintain appropriate glucose levels will improve Outcome: Progressing   Problem: Nutritional: Goal: Maintenance of adequate nutrition will improve Outcome: Progressing   Problem: Respiratory: Goal: Will regain and/or maintain adequate ventilation Outcome: Progressing   Problem: Urinary Elimination: Goal: Ability to achieve and maintain adequate renal perfusion and functioning will improve Outcome: Progressing

## 2022-05-21 ENCOUNTER — Other Ambulatory Visit (HOSPITAL_COMMUNITY): Payer: Self-pay

## 2022-05-21 ENCOUNTER — Telehealth (HOSPITAL_COMMUNITY): Payer: Self-pay | Admitting: Pharmacy Technician

## 2022-05-21 LAB — BASIC METABOLIC PANEL
Anion gap: 7 (ref 5–15)
BUN: 17 mg/dL (ref 6–20)
CO2: 24 mmol/L (ref 22–32)
Calcium: 9 mg/dL (ref 8.9–10.3)
Chloride: 107 mmol/L (ref 98–111)
Creatinine, Ser: 0.65 mg/dL (ref 0.61–1.24)
GFR, Estimated: >60 mL/min (ref >60)
Glucose, Bld: 96 mg/dL (ref 70–99)
Potassium: 3.5 mmol/L (ref 3.5–5.1)
Sodium: 138 mmol/L (ref 135–145)

## 2022-05-21 LAB — GLUCOSE, CAPILLARY
Glucose-Capillary: 121 mg/dL — ABNORMAL HIGH (ref 70–99)
Glucose-Capillary: 238 mg/dL — ABNORMAL HIGH (ref 70–99)
Glucose-Capillary: 329 mg/dL — ABNORMAL HIGH (ref 70–99)

## 2022-05-21 MED ORDER — ORITAVANCIN DIPHOSPHATE 400 MG IV SOLR
1200.0000 mg | Freq: Once | INTRAVENOUS | Status: AC
  Administered 2022-05-21: 1200 mg via INTRAVENOUS
  Filled 2022-05-21: qty 120

## 2022-05-21 MED ORDER — CEFAZOLIN SODIUM-DEXTROSE 2-4 GM/100ML-% IV SOLN: 2.0000 g | Freq: Three times a day (TID) | INTRAVENOUS | Status: DC

## 2022-05-21 MED ORDER — CEFADROXIL 500 MG PO CAPS
1.0000 g | ORAL_CAPSULE | Freq: Three times a day (TID) | ORAL | 0 refills | Status: AC
  Filled 2022-05-21: qty 38, 6d supply, fill #0
  Filled 2022-05-21: qty 130, 22d supply, fill #0

## 2022-05-21 MED ORDER — ORITAVANCIN DIPHOSPHATE 400 MG IV SOLR
1200.0000 mg | Freq: Once | INTRAVENOUS | Status: DC
  Filled 2022-05-21: qty 120

## 2022-05-21 NOTE — Telephone Encounter (Signed)
Patient Advocate Encounter   Received notification that prior authorization for Linezolid 600MG  tablets is required.   PA submitted on 05/21/2022 Key [id number] Status is pending       Lyndel Safe, [place] Patient Advocate Specialist [place] Patient Advocate Team Direct Number: [phone]  Fax: [phone]

## 2022-05-21 NOTE — Progress Notes (Signed)
Orbactiv infusion finished. Patient discharged with all belongings. All questions and concerns addressed. Pt provided bus voucher to bus station.

## 2022-05-21 NOTE — Progress Notes (Signed)
Subjective: Has changed his mind repeatedly about leaving [facility] prior to tee  No complaint otherwise  Explained to him the plan of oral abx transition and a dose long acting glycopeptide  He is agreeable  Antibiotics:  Anti-infectives (From admission, onward)    Start     Dose/Rate Route Frequency Ordered Stop   05/28/22 0000  cefadroxil (DURICEF) 500 MG capsule        1 g Oral 3 times daily 05/21/22 1216 06/25/22 2359   05/21/22 1500  ceFAZolin (ANCEF) IVPB 2g/100 mL premix  Status:  Discontinued        2 g 200 mL/hr over 30 Minutes Intravenous Every 8 hours 05/21/22 0945 05/21/22 1211   05/21/22 1300  Oritavancin Diphosphate (ORBACTIV) 1,200 mg in dextrose 5 % IVPB        1,200 mg 333.3 mL/hr over 180 Minutes Intravenous Once 05/21/22 1211     05/21/22 1030  Oritavancin Diphosphate (ORBACTIV) 1,200 mg in dextrose 5 % IVPB  Status:  Discontinued        1,200 mg 333.3 mL/hr over 180 Minutes Intravenous Once 05/21/22 0935 05/21/22 0943   05/17/22 2100  ceFAZolin (ANCEF) IVPB 2g/100 mL premix  Status:  Discontinued        2 g 200 mL/hr over 30 Minutes Intravenous Every 8 hours 05/17/22 2000 05/21/22 0935       Medications: Scheduled Meds:  amLODipine  10 mg Oral Daily   Chlorhexidine Gluconate Cloth  6 each Topical Daily   enoxaparin (LOVENOX) injection  40 mg Subcutaneous Q24H   insulin aspart  0-5 Units Subcutaneous QHS   insulin aspart  0-9 Units Subcutaneous TID WC   insulin aspart protamine- aspart  35 Units Subcutaneous BID WC   lisinopril  10 mg Oral Daily   mupirocin ointment  1 Application Nasal BID   Continuous Infusions:  Oritavancin Diphosphate (ORBACTIV) 1,200 mg in dextrose 5 % IVPB     PRN Meds:.dextrose, mouth rinse, mouth rinse    Objective: Weight change:   Intake/Output Summary (Last 24 hours) at 05/21/2022 1356 Last data filed at 05/21/2022 1034 Gross per 24 hour  Intake 440.74 ml  Output --  Net 440.74 ml   Blood pressure  135/76, pulse 92, temperature 97.7 F (36.5 C), temperature source Oral, resp. rate 18, height 5\' 11"  (1.803 m), weight 97.7 kg, SpO2 98 %. Temp:  [97.7 F (36.5 C)] 97.7 F (36.5 C) (10/23 2025) Pulse Rate:  [92] 92 (10/23 2025) Resp:  [18] 18 (10/23 2025) BP: (135)/(76) 135/76 (10/23 2025) SpO2:  [98 %] 98 % (10/23 2025)  Physical Exam: General/constitutional: no distress, pleasant HEENT: Normocephalic, PER, Conj Clear, EOMI, Oropharynx clear Neck supple CV: rrr no mrg Lungs: clear to auscultation, normal respiratory effort Abd: Soft, Nontender Ext: no edema Skin: No Rash Neuro: nonfocal MSK: no peripheral joint swelling/tenderness/warmth; back spines nontender      LABs: Lab Results  Component Value Date   WBC 5.3 05/16/2022   HGB 15.6 05/16/2022   HCT 46.0 05/16/2022   MCV 81.4 05/16/2022   PLT 241 05/16/2022   Last metabolic panel Lab Results  Component Value Date   GLUCOSE 96 05/21/2022   NA 138 05/21/2022   K 3.5 05/21/2022   CL 107 05/21/2022   CO2 24 05/21/2022   BUN 17 05/21/2022   CREATININE 0.65 05/21/2022   GFRNONAA >60 05/21/2022   CALCIUM 9.0 05/21/2022   PHOS 3.8 05/08/2022   PROT 7.2  05/16/2022   ALBUMIN 3.9 05/16/2022   BILITOT 1.4 (H) 05/16/2022   ALKPHOS 70 05/16/2022   AST 16 05/16/2022   ALT 15 05/16/2022   ANIONGAP 7 05/21/2022    Micro Results: 10/22 bcx ngtd 10/21 bcx ngtd 10/19 and 10/20 bcx mssa  Studies/Results: 10/21 tte No evidence of valvular vegetations on  this transthoracic echocardiogram. Consider a transesophageal  echocardiogram to exclude infective endocarditis if clinically indicated.   Assessment/Plan:  Principal Problem:   MSSA bacteremia Active Problems:   HTN (hypertension)   AKI (acute kidney injury) ([place])   DM2 (diabetes mellitus, type 2) ([place])   Hyperosmolar hyperglycemic state (HHS) ([place])  Abx: 10/20-c cefazolin   [name] [name] is a 36 y.o. male with controlled diabetes mellitus with  admission for DKA/HHS found to have mssa bacteremia of unclear source  He has no localizing metastatic infectious focus 10/19 and 10/20 bcx positive but abx wasn't started until 10/20  He so far clears as of 10/21 TTE negative  He wanted to leave by 10/28 to get on the bus to [place] by 10/30 but willing to stay for tee as he wants to definitively find out if endocarditis had occurred   -------- 10/24 assessment Patient has repeatedly changed his mind on leaving / high risk AMA  He has defervesed for several days and repeat bcx sterilized  We will give a dose of long acting glycopeptide and have him start oral antibiotics in around 5 days after this infusion to finish roughly 6 weeks antibiotics  He is advised to check in with a provider in [place] as needed if fever, chill, new persistent joint pain/back pain. But to also check in in around 3-4 weeks to monitor drug toxicity  He is agreeable to this  discussed with primary team           Jabier Mutton, [place] for [place] [phone]  pager   [phone] cell 05/21/2022, 1:56 PM

## 2022-05-21 NOTE — Discharge Summary (Signed)
Physician Discharge Summary   [name] [name] OZH:[id number] DOB: 1985-11-18 DOA: 05/16/2022  PCP: Pcp, No  Admit date: 05/16/2022 Discharge date: 05/21/2022 Barriers to discharge: none  Admitted From: Home Disposition:  Home Discharging physician: Lewie Chamber, MD  Recommendations for Outpatient Follow-up:  Follow up with primary care in [place] [facility] info provided on AVS if needed after discahrge   Home Health:  Equipment/Devices:   Discharge Condition: stable CODE STATUS: Full Diet recommendation:  Diet Orders (From admission, onward)     Start     Ordered   05/21/22 0000  Diet Carb Modified        05/21/22 1401   05/17/22 0953  Diet Carb Modified Fluid consistency: Thin; Room service appropriate? Yes  Diet effective now       Question Answer Comment  Diet-HS Snack? Nothing   Calorie Level Medium 1600-2000   Fluid consistency: Thin   Room service appropriate? Yes      05/17/22 0952            Hospital Course: [name] [name] is a 36 y.o. male with DMII who presented with body aches, headaches.  He initially was found to have uncontrolled hyperglycemia.  There was concern for recurrence poor diabetes control on admission.  He was treated with fluids and insulin drip.  He was transitioned back to subcutaneous insulins. Further work-up revealed MSSA bacteremia, source unknown.  He was followed by ID. TTE was performed on 05/18/2022 with no evidence of vegetations.  A TEE was planned for 05/23/2022 however patient changed his mind multiple times between leaving AMA and staying hospitalized for TEE.  Due to concern for noncompliance he was given a dose of oritavancin and will complete 4 weeks of cefadroxil for total of 6-week treatment course. This was considered appropriate treatment course and discussed multiple times with ID. Patient was recommended to follow-up with primary care upon returning to [place] and also provided [facility] information.    The patient's chronic  medical conditions were treated accordingly per the patient's home medication regimen except as noted.  On day of discharge, patient was felt deemed stable for discharge. Patient/family member advised to call PCP or come back to ER if needed.   Principal Diagnosis: MSSA bacteremia  Discharge Diagnoses: Active Hospital Problems   Diagnosis Date Noted   MSSA bacteremia 05/19/2022   Hyperosmolar hyperglycemic state (HHS) (HCC) 05/16/2022   HTN (hypertension) 05/08/2022   AKI (acute kidney injury) (HCC) 05/08/2022   DM2 (diabetes mellitus, type 2) (HCC) 05/08/2022    Resolved Hospital Problems  No resolved problems to display.     Discharge Instructions     Diet Carb Modified   Complete by: As directed    Increase activity slowly   Complete by: As directed       Allergies as of 05/21/2022   No Known Allergies      Medication List     TAKE these medications    amLODipine 10 MG tablet Commonly known as: NORVASC Take 1 tablet (10 mg total) by mouth daily.   cefadroxil 500 MG capsule Commonly known as: DURICEF Take 2 capsules (1,000 mg total) by mouth 3 (three) times daily for 28 days. Start taking on: May 28, 2022   insulin regular 100 units/mL injection Commonly known as: HumuLIN R As per sliding scale   lisinopril 10 MG tablet Commonly known as: ZESTRIL Take 1 tablet (10 mg total) by mouth daily.   NovoLIN 70/30 Kwikpen (70-30) 100 UNIT/ML KwikPen Generic drug: insulin  isophane & regular human KwikPen Inject 28 Units into the skin 2 (two) times daily.        Follow-up Information     [facility]. Schedule an appointment as soon as possible for a visit in 1 week(s).   Contact information: [address] [place] [address] [phone]        [facility] Follow up.   Specialty: Infectious Diseases Why: Call if needed if unable to schedule follow up in [place] Contact  information: [address] [phone] mc [place] [address] [phone]               No Known Allergies  Consultations: ID  Procedures:   Discharge Exam: BP 137/83 (BP Location: Left Arm)   Pulse 87   Temp 98.4 F (36.9 C) (Oral)   Resp 16   Ht 5\' 11"  (1.803 m)   Wt 97.7 kg   SpO2 100%   BMI 30.04 kg/m  Physical Exam Constitutional:      General: He is not in acute distress.    Appearance: Normal appearance.  HENT:     Head: Normocephalic and atraumatic.     Mouth/Throat:     Mouth: Mucous membranes are moist.  Eyes:     Extraocular Movements: Extraocular movements intact.  Cardiovascular:     Rate and Rhythm: Normal rate and regular rhythm.     Heart sounds: Normal heart sounds.  Pulmonary:     Effort: Pulmonary effort is normal. No respiratory distress.     Breath sounds: Normal breath sounds. No wheezing.  Abdominal:     General: Bowel sounds are normal. There is no distension.     Palpations: Abdomen is soft.     Tenderness: There is no abdominal tenderness.  Musculoskeletal:        General: Normal range of motion.     Cervical back: Normal range of motion and neck supple.  Skin:    General: Skin is warm and dry.  Neurological:     General: No focal deficit present.     Mental Status: He is alert.  Psychiatric:        Mood and Affect: Mood normal.        Behavior: Behavior normal.      The results of significant diagnostics from this hospitalization (including imaging, microbiology, ancillary and laboratory) are listed below for reference.   Microbiology: Recent Results (from the past 240 hour(s))  Culture, blood (routine x 2)     Status: Abnormal   Collection Time: 05/16/22  9:21 PM   Specimen: BLOOD  Result Value Ref Range Status   Specimen Description   Final    BLOOD SITE NOT SPECIFIED Performed at [facility], [address], [place], [place]    Special Requests   Final     BOTTLES DRAWN AEROBIC AND ANAEROBIC Blood Culture adequate volume Performed at [facility], [address] [place], [place]    Culture  Setup Time   Final    GRAM POSITIVE COCCI IN CLUSTERS ANAEROBIC BOTTLE ONLY CRITICAL VALUE NOTED.  VALUE IS CONSISTENT WITH PREVIOUSLY REPORTED AND CALLED VALUE.    Culture (A)  Final    STAPHYLOCOCCUS AUREUS SUSCEPTIBILITIES PERFORMED ON PREVIOUS CULTURE WITHIN THE LAST 5 DAYS. Performed at [facility], [address] [place], [place]    Report Status 05/19/2022 FINAL  Final  Culture,  blood (routine x 2)     Status: Abnormal   Collection Time: 05/17/22  1:02 AM   Specimen: BLOOD  Result Value Ref Range Status   Specimen Description   Final    BLOOD SITE NOT SPECIFIED Performed at [facility], [address] [place], [place] [address]    Special Requests   Final    BOTTLES DRAWN AEROBIC AND ANAEROBIC Blood Culture adequate volume Performed at [facility], [address] [place], [place] [address]    Culture  Setup Time   Final    GRAM POSITIVE COCCI IN CLUSTERS IN BOTH AEROBIC AND ANAEROBIC BOTTLES Organism ID to follow CRITICAL RESULT CALLED TO, READ BACK BY AND VERIFIED WITH: PHARMD DLoretha Stapler [id number] FH Performed at [facility], [address], [place], [place] [address]    Culture STAPHYLOCOCCUS AUREUS (A)  Final   Report Status 05/19/2022 FINAL  Final   Organism ID, Bacteria STAPHYLOCOCCUS AUREUS  Final      Susceptibility   Staphylococcus aureus - MIC*    CIPROFLOXACIN <=0.5 SENSITIVE Sensitive     ERYTHROMYCIN <=0.25 SENSITIVE Sensitive     GENTAMICIN <=0.5 SENSITIVE Sensitive     OXACILLIN <=0.25 SENSITIVE Sensitive     TETRACYCLINE <=1 SENSITIVE Sensitive     VANCOMYCIN 1 SENSITIVE Sensitive     TRIMETH/SULFA <=10 SENSITIVE Sensitive     CLINDAMYCIN <=0.25 SENSITIVE Sensitive     RIFAMPIN <=0.5 SENSITIVE Sensitive     Inducible  Clindamycin NEGATIVE Sensitive     * STAPHYLOCOCCUS AUREUS  Blood Culture ID Panel (Reflexed)     Status: Abnormal   Collection Time: 05/17/22  1:02 AM  Result Value Ref Range Status   Enterococcus faecalis NOT DETECTED NOT DETECTED Final   Enterococcus Faecium NOT DETECTED NOT DETECTED Final   Listeria monocytogenes NOT DETECTED NOT DETECTED Final   Staphylococcus species DETECTED (A) NOT DETECTED Final    Comment: CRITICAL RESULT CALLED TO, READ BACK BY AND VERIFIED WITH: PHARMD D. WOFFORD [id number] 1902 FH    Staphylococcus aureus (BCID) DETECTED (A) NOT DETECTED Final    Comment: CRITICAL RESULT CALLED TO, READ BACK BY AND VERIFIED WITH: PHARMD D. WOFFORD [id number] 1902 FH    Staphylococcus epidermidis NOT DETECTED NOT DETECTED Final   Staphylococcus lugdunensis NOT DETECTED NOT DETECTED Final   Streptococcus species NOT DETECTED NOT DETECTED Final   Streptococcus agalactiae NOT DETECTED NOT DETECTED Final   Streptococcus pneumoniae NOT DETECTED NOT DETECTED Final   Streptococcus pyogenes NOT DETECTED NOT DETECTED Final   A.calcoaceticus-baumannii NOT DETECTED NOT DETECTED Final   Bacteroides fragilis NOT DETECTED NOT DETECTED Final   Enterobacterales NOT DETECTED NOT DETECTED Final   Enterobacter cloacae complex NOT DETECTED NOT DETECTED Final   Escherichia coli NOT DETECTED NOT DETECTED Final   Klebsiella aerogenes NOT DETECTED NOT DETECTED Final   Klebsiella oxytoca NOT DETECTED NOT DETECTED Final   Klebsiella pneumoniae NOT DETECTED NOT DETECTED Final   Proteus species NOT DETECTED NOT DETECTED Final   Salmonella species NOT DETECTED NOT DETECTED Final   Serratia marcescens NOT DETECTED NOT DETECTED Final   Haemophilus influenzae NOT DETECTED NOT DETECTED Final   Neisseria meningitidis NOT DETECTED NOT DETECTED Final   Pseudomonas aeruginosa NOT DETECTED NOT DETECTED Final   Stenotrophomonas maltophilia NOT DETECTED NOT DETECTED Final   Candida albicans NOT DETECTED NOT  DETECTED Final   Candida auris NOT DETECTED NOT DETECTED Final   Candida glabrata NOT DETECTED NOT DETECTED Final  Candida krusei NOT DETECTED NOT DETECTED Final   Candida parapsilosis NOT DETECTED NOT DETECTED Final   Candida tropicalis NOT DETECTED NOT DETECTED Final   Cryptococcus neoformans/gattii NOT DETECTED NOT DETECTED Final   Meth resistant mecA/C and MREJ NOT DETECTED NOT DETECTED Final    Comment: Performed at [facility], [address] [place], [place] [address]  MRSA Next Gen by PCR, Nasal     Status: Abnormal   Collection Time: 05/17/22  1:37 AM   Specimen: Nasal Mucosa; Nasal Swab  Result Value Ref Range Status   MRSA by PCR Next Gen DETECTED (A) NOT DETECTED Final    Comment: (NOTE) The GeneXpert MRSA Assay (FDA approved for NASAL specimens only), is one component of a comprehensive MRSA colonization surveillance program. It is not intended to diagnose MRSA infection nor to guide or monitor treatment for MRSA infections. Test performance is not FDA approved in patients less than 76 years old. Performed at [facility], [address] [place], [place] [address]   Culture, blood (Routine X 2) w Reflex to ID Panel     Status: None (Preliminary result)   Collection Time: 05/18/22  3:03 PM   Specimen: BLOOD  Result Value Ref Range Status   Specimen Description   Final    BLOOD BLOOD LEFT HAND Performed at [facility], [address] [place], [place] [address]    Special Requests   Final    BOTTLES DRAWN AEROBIC ONLY Blood Culture adequate volume Performed at [facility], [address] [place], [place] [address]    Culture   Final    NO GROWTH 3 DAYS Performed at [facility], [address], [place], [place] [address]    Report Status PENDING  Incomplete  Culture, blood (Routine X 2) w Reflex to ID Panel     Status: None (Preliminary result)   Collection Time: 05/18/22  3:08 PM    Specimen: BLOOD  Result Value Ref Range Status   Specimen Description   Final    BLOOD BLOOD LEFT HAND Performed at [facility], [address] [place], [place] [address]    Special Requests   Final    BOTTLES DRAWN AEROBIC ONLY Blood Culture adequate volume Performed at [facility], [address], [place], [place] [address]    Culture   Final    NO GROWTH 3 DAYS Performed at [facility], [address], [place], [place] [address]    Report Status PENDING  Incomplete  Culture, blood (Routine X 2) w Reflex to ID Panel     Status: None (Preliminary result)   Collection Time: 05/19/22  1:56 PM   Specimen: BLOOD  Result Value Ref Range Status   Specimen Description   Final    BLOOD BLOOD LEFT HAND Performed at [facility], [address] [place], [place] [address]    Special Requests   Final    BOTTLES DRAWN AEROBIC ONLY Blood Culture adequate volume Performed at [facility], [address] [place], [place] [address]    Culture   Final    NO GROWTH 2 DAYS Performed at [facility], [address], [place], [place] [address]    Report Status PENDING  Incomplete  Culture, blood (Routine X 2) w Reflex to ID Panel     Status: None (Preliminary result)   Collection Time: 05/19/22  1:56 PM  Specimen: BLOOD  Result Value Ref Range Status   Specimen Description   Final    BLOOD LEFT ANTECUBITAL Performed at [facility] [address] [place], [place] [address]    Special Requests   Final    BOTTLES DRAWN AEROBIC ONLY Blood Culture adequate volume Performed at [place] [address], [place], [place] [address]    Culture   Final    NO GROWTH 2 DAYS Performed at [facility] [place] [address] [place], [place] [address]    Report Status PENDING  Incomplete     Labs: BNP (last 3 results) Recent Labs    08/28/21 0936  BNP 9.0    Basic Metabolic Panel: Recent Labs  Lab 05/17/22 1701 05/18/22 1133 05/18/22 2123 05/19/22 1006 05/20/22 0333 05/21/22 0319  NA 129* 132*  --  131* 137 138  K 3.5 3.4*  --  4.0 3.7 3.5  CL 98 102  --  100 106 107  CO2 22 25  --  26 23 24   GLUCOSE 352* 207* 413* 411* 87 96  BUN 17 14  --  15 16 17   CREATININE 1.02 0.82  --  0.98 0.81 0.65  CALCIUM 8.4* 8.6*  --  8.5* 8.9 9.0   Liver Function Tests: Recent Labs  Lab 05/16/22 2126  AST 16  ALT 15  ALKPHOS 70  BILITOT 1.4*  PROT 7.2  ALBUMIN 3.9   No results for input(s): "LIPASE", "AMYLASE" in the last 168 hours. No results for input(s): "AMMONIA" in the last 168 hours. CBC: Recent Labs  Lab 05/16/22 2126 05/16/22 2221  WBC 5.3  --   NEUTROABS 2.8  --   HGB 15.2 15.6  HCT 44.1 46.0  MCV 81.4  --   PLT 241  --    Cardiac Enzymes: No results for input(s): "CKTOTAL", "CKMB", "CKMBINDEX", "TROPONINI" in the last 168 hours. BNP: Invalid input(s): "POCBNP" CBG: Recent Labs  Lab 05/20/22 1241 05/20/22 1640 05/20/22 2203 05/21/22 0734 05/21/22 1137  GLUCAP 317* 225* 95 121* 238*   D-Dimer No results for input(s): "DDIMER" in the last 72 hours. Hgb A1c No results for input(s): "HGBA1C" in the last 72 hours. Lipid Profile No results for input(s): "CHOL", "HDL", "LDLCALC", "TRIG", "CHOLHDL", "LDLDIRECT" in the last 72 hours. Thyroid function studies No results for input(s): "TSH", "T4TOTAL", "T3FREE", "THYROIDAB" in the last 72 hours.  Invalid input(s): "FREET3" Anemia work up No results for input(s): "VITAMINB12", "FOLATE", "FERRITIN", "TIBC", "IRON", "RETICCTPCT" in the last 72 hours. Urinalysis    Component Value Date/Time   COLORURINE STRAW (A) 05/17/2022 0217   APPEARANCEUR CLEAR 05/17/2022 0217   LABSPEC 1.027 05/17/2022 0217   PHURINE 7.0 05/17/2022 0217   GLUCOSEU >=500 (A) 05/17/2022 0217   HGBUR NEGATIVE 05/17/2022 0217   BILIRUBINUR NEGATIVE 05/17/2022 0217   KETONESUR 20 (A) 05/17/2022  0217   PROTEINUR NEGATIVE 05/17/2022 0217   NITRITE NEGATIVE 05/17/2022 0217   LEUKOCYTESUR NEGATIVE 05/17/2022 0217   Sepsis Labs Recent Labs  Lab 05/16/22 2126  WBC 5.3   Microbiology Recent Results (from the past 240 hour(s))  Culture, blood (routine x 2)     Status: Abnormal   Collection Time: 05/16/22  9:21 PM   Specimen: BLOOD  Result Value Ref Range Status   Specimen Description   Final    BLOOD SITE NOT SPECIFIED Performed at [facility], [place] [address] [place], [place] [address]    Special Requests  Final    BOTTLES DRAWN AEROBIC AND ANAEROBIC Blood Culture adequate volume Performed at [facility], [address], [place], [place] [address]    Culture  Setup Time   Final    GRAM POSITIVE COCCI IN CLUSTERS ANAEROBIC BOTTLE ONLY CRITICAL VALUE NOTED.  VALUE IS CONSISTENT WITH PREVIOUSLY REPORTED AND CALLED VALUE.    Culture (A)  Final    STAPHYLOCOCCUS AUREUS SUSCEPTIBILITIES PERFORMED ON PREVIOUS CULTURE WITHIN THE LAST 5 DAYS. Performed at [facility], [address] [place], [place] [address]    Report Status 05/19/2022 FINAL  Final  Culture, blood (routine x 2)     Status: Abnormal   Collection Time: 05/17/22  1:02 AM   Specimen: BLOOD  Result Value Ref Range Status   Specimen Description   Final    BLOOD SITE NOT SPECIFIED Performed at [facility], [address] [place], [place] [address]    Special Requests   Final    BOTTLES DRAWN AEROBIC AND ANAEROBIC Blood Culture adequate volume Performed at [facility], [address], [place], [place] [address]    Culture  Setup Time   Final    GRAM POSITIVE COCCI IN CLUSTERS IN BOTH AEROBIC AND ANAEROBIC BOTTLES Organism ID to follow CRITICAL RESULT CALLED TO, READ BACK BY AND VERIFIED WITH: PHARMD DLoretha Stapler [id number] FH Performed at [facility], [address], [place], [place] [address]    Culture  STAPHYLOCOCCUS AUREUS (A)  Final   Report Status 05/19/2022 FINAL  Final   Organism ID, Bacteria STAPHYLOCOCCUS AUREUS  Final      Susceptibility   Staphylococcus aureus - MIC*    CIPROFLOXACIN <=0.5 SENSITIVE Sensitive     ERYTHROMYCIN <=0.25 SENSITIVE Sensitive     GENTAMICIN <=0.5 SENSITIVE Sensitive     OXACILLIN <=0.25 SENSITIVE Sensitive     TETRACYCLINE <=1 SENSITIVE Sensitive     VANCOMYCIN 1 SENSITIVE Sensitive     TRIMETH/SULFA <=10 SENSITIVE Sensitive     CLINDAMYCIN <=0.25 SENSITIVE Sensitive     RIFAMPIN <=0.5 SENSITIVE Sensitive     Inducible Clindamycin NEGATIVE Sensitive     * STAPHYLOCOCCUS AUREUS  Blood Culture ID Panel (Reflexed)     Status: Abnormal   Collection Time: 05/17/22  1:02 AM  Result Value Ref Range Status   Enterococcus faecalis NOT DETECTED NOT DETECTED Final   Enterococcus Faecium NOT DETECTED NOT DETECTED Final   Listeria monocytogenes NOT DETECTED NOT DETECTED Final   Staphylococcus species DETECTED (A) NOT DETECTED Final    Comment: CRITICAL RESULT CALLED TO, READ BACK BY AND VERIFIED WITH: PHARMD D. WOFFORD [id number] 1902 FH    Staphylococcus aureus (BCID) DETECTED (A) NOT DETECTED Final    Comment: CRITICAL RESULT CALLED TO, READ BACK BY AND VERIFIED WITH: PHARMD D. WOFFORD [id number] 1902 FH    Staphylococcus epidermidis NOT DETECTED NOT DETECTED Final   Staphylococcus lugdunensis NOT DETECTED NOT DETECTED Final   Streptococcus species NOT DETECTED NOT DETECTED Final   Streptococcus agalactiae NOT DETECTED NOT DETECTED Final   Streptococcus pneumoniae NOT DETECTED NOT DETECTED Final   Streptococcus pyogenes NOT DETECTED NOT DETECTED Final   A.calcoaceticus-baumannii NOT DETECTED NOT DETECTED Final   Bacteroides fragilis NOT DETECTED NOT DETECTED Final   Enterobacterales NOT DETECTED NOT DETECTED Final   Enterobacter cloacae complex NOT DETECTED NOT DETECTED Final   Escherichia coli NOT DETECTED NOT DETECTED Final   Klebsiella aerogenes NOT  DETECTED NOT DETECTED Final  Klebsiella oxytoca NOT DETECTED NOT DETECTED Final   Klebsiella pneumoniae NOT DETECTED NOT DETECTED Final   Proteus species NOT DETECTED NOT DETECTED Final   Salmonella species NOT DETECTED NOT DETECTED Final   Serratia marcescens NOT DETECTED NOT DETECTED Final   Haemophilus influenzae NOT DETECTED NOT DETECTED Final   Neisseria meningitidis NOT DETECTED NOT DETECTED Final   Pseudomonas aeruginosa NOT DETECTED NOT DETECTED Final   Stenotrophomonas maltophilia NOT DETECTED NOT DETECTED Final   Candida albicans NOT DETECTED NOT DETECTED Final   Candida auris NOT DETECTED NOT DETECTED Final   Candida glabrata NOT DETECTED NOT DETECTED Final   Candida krusei NOT DETECTED NOT DETECTED Final   Candida parapsilosis NOT DETECTED NOT DETECTED Final   Candida tropicalis NOT DETECTED NOT DETECTED Final   Cryptococcus neoformans/gattii NOT DETECTED NOT DETECTED Final   Meth resistant mecA/C and MREJ NOT DETECTED NOT DETECTED Final    Comment: Performed at [facility], [address] [place], [place] [address]  MRSA Next Gen by PCR, Nasal     Status: Abnormal   Collection Time: 05/17/22  1:37 AM   Specimen: Nasal Mucosa; Nasal Swab  Result Value Ref Range Status   MRSA by PCR Next Gen DETECTED (A) NOT DETECTED Final    Comment: (NOTE) The GeneXpert MRSA Assay (FDA approved for NASAL specimens only), is one component of a comprehensive MRSA colonization surveillance program. It is not intended to diagnose MRSA infection nor to guide or monitor treatment for MRSA infections. Test performance is not FDA approved in patients less than 349 years old. Performed at [facility], [address] [place], [place] [address]   Culture, blood (Routine X 2) w Reflex to ID Panel     Status: None (Preliminary result)   Collection Time: 05/18/22  3:03 PM   Specimen: BLOOD  Result Value Ref Range Status   Specimen Description   Final    BLOOD BLOOD  LEFT HAND Performed at [facility], [address] [place], [place] [address]    Special Requests   Final    BOTTLES DRAWN AEROBIC ONLY Blood Culture adequate volume Performed at [name], [address] [place], [place] [address]    Culture   Final    NO GROWTH 3 DAYS Performed at [facility], [address] [place], [place] [address]    Report Status PENDING  Incomplete  Culture, blood (Routine X 2) w Reflex to ID Panel     Status: None (Preliminary result)   Collection Time: 05/18/22  3:08 PM   Specimen: BLOOD  Result Value Ref Range Status   Specimen Description   Final    BLOOD BLOOD LEFT HAND Performed at [facility], [address] [place], [place] [address]    Special Requests   Final    BOTTLES DRAWN AEROBIC ONLY Blood Culture adequate volume Performed at [facility], [address] [place], [place] [address]    Culture   Final    NO GROWTH 3 DAYS Performed at [facility], [address] [place], [place] [address]    Report Status PENDING  Incomplete  Culture, blood (Routine X 2) w Reflex to ID Panel     Status: None (Preliminary result)   Collection Time: 05/19/22  1:56 PM   Specimen: BLOOD  Result Value Ref Range Status   Specimen Description   Final    BLOOD BLOOD LEFT HAND  Performed at [facility], [address], [place], [place] [address]    Special Requests   Final    BOTTLES DRAWN AEROBIC ONLY Blood Culture adequate volume Performed at [facility], [address], [place], [place] [address]    Culture   Final    NO GROWTH 2 DAYS Performed at [facility], [address] [place], [place] [address]    Report Status PENDING  Incomplete  Culture, blood (Routine X 2) w Reflex to ID Panel     Status: None (Preliminary result)   Collection Time: 05/19/22  1:56 PM   Specimen: BLOOD  Result Value Ref Range Status    Specimen Description   Final    BLOOD LEFT ANTECUBITAL Performed at [facility], [address], [place], [place] [address]    Special Requests   Final    BOTTLES DRAWN AEROBIC ONLY Blood Culture adequate volume Performed at [facility], [address], [place], [place] [address]    Culture   Final    NO GROWTH 2 DAYS Performed at [facility], [address], [place], [place] [address]    Report Status PENDING  Incomplete    Procedures/Studies: ECHOCARDIOGRAM COMPLETE  Result Date: 05/18/2022    ECHOCARDIOGRAM REPORT   Patient Name:   RILYN UPSHAW Date of Exam: 05/18/2022 Medical Rec #:  [id number]      Height:       71.0 in Accession #:    [id number]     Weight:       215.4 lb Date of Birth:  [birth year]      BSA:          2.176 m Patient Age:    36 years       BP:           144/94 mmHg Patient Gender: M              HR:           81 bpm. Exam Location:  Inpatient Procedure: 2D Echo Indications:    Bacteremia  History:        Patient has no prior history of Echocardiogram examinations.                 Risk Factors:Hypertension and Diabetes.  Sonographer:    Cathie Hoops Referring Phys: [id number] CORNELIUS N VAN DAM IMPRESSIONS  1. Left ventricular ejection fraction, by estimation, is 60 to 65%. The left ventricle has normal function. The left ventricle has no regional wall motion abnormalities. Left ventricular diastolic parameters were normal. There is the interventricular septum is flattened in systole, consistent with right ventricular pressure overload.  2. Right ventricular systolic function is normal. The right ventricular size is normal. There is normal pulmonary artery systolic pressure. Conclusion(s)/Recommendation(s): No evidence of valvular vegetations on this transthoracic echocardiogram. Consider a transesophageal echocardiogram to exclude infective endocarditis if clinically indicated. FINDINGS  Left Ventricle: Left ventricular ejection  fraction, by estimation, is 60 to 65%. The left ventricle has normal function. The left ventricle has no regional wall motion abnormalities. The left ventricular internal cavity size was normal in size. There is  no left ventricular hypertrophy. The interventricular septum is flattened in systole, consistent with right ventricular pressure overload. Left ventricular diastolic parameters were normal. Right Ventricle: The right ventricular size is normal. No increase in right ventricular wall thickness. Right ventricular systolic function is normal. There is normal pulmonary artery  systolic pressure. The tricuspid regurgitant velocity is 1.93 m/s, and  with an assumed right atrial pressure of 3 mmHg, the estimated right ventricular systolic pressure is 17.9 mmHg. Left Atrium: Left atrial size was normal in size. Right Atrium: Right atrial size was normal in size. Pericardium: There is no evidence of pericardial effusion. Mitral Valve: The mitral valve is normal in structure. No evidence of mitral valve regurgitation. No evidence of mitral valve stenosis. Tricuspid Valve: The tricuspid valve is grossly normal. Tricuspid valve regurgitation is not demonstrated. No evidence of tricuspid stenosis. Aortic Valve: The aortic valve is normal in structure. Aortic valve regurgitation is not visualized. No aortic stenosis is present. Aortic valve mean gradient measures 2.0 mmHg. Aortic valve peak gradient measures 4.1 mmHg. Aortic valve area, by VTI measures 2.99 cm. Pulmonic Valve: The pulmonic valve was not well visualized. Pulmonic valve regurgitation is trivial. No evidence of pulmonic stenosis. Aorta: The aortic root is normal in size and structure. Venous: The inferior vena cava is normal in size with greater than 50% respiratory variability, suggesting right atrial pressure of 3 mmHg.  LEFT VENTRICLE PLAX 2D LVIDd:         4.35 cm      Diastology LVIDs:         2.90 cm      LV e' medial:    6.20 cm/s LV PW:         1.10  cm      LV E/e' medial:  8.8 LV IVS:        1.05 cm      LV e' lateral:   10.80 cm/s LVOT diam:     2.40 cm      LV E/e' lateral: 5.0 LV SV:         48 LV SV Index:   22 LVOT Area:     4.52 cm  LV Volumes (MOD) LV vol d, MOD A2C: 94.8 ml LV vol d, MOD A4C: 111.0 ml LV vol s, MOD A2C: 38.3 ml LV vol s, MOD A4C: 42.2 ml LV SV MOD A2C:     56.5 ml LV SV MOD A4C:     111.0 ml LV SV MOD BP:      64.7 ml RIGHT VENTRICLE RV Basal diam:  3.30 cm RV Mid diam:    2.90 cm RV S prime:     12.00 cm/s TAPSE (M-mode): 2.8 cm LEFT ATRIUM             Index        RIGHT ATRIUM           Index LA diam:        2.90 cm 1.33 cm/m   RA Area:     13.20 cm LA Vol (A2C):   23.6 ml 10.85 ml/m  RA Volume:   27.80 ml  12.78 ml/m LA Vol (A4C):   32.8 ml 15.07 ml/m LA Biplane Vol: 29.5 ml 13.56 ml/m  AORTIC VALVE                    PULMONIC VALVE AV Area (Vmax):    2.92 cm     PV Vmax:          1.07 m/s AV Area (Vmean):   2.97 cm     PV Peak grad:     4.6 mmHg AV Area (VTI):     2.99 cm     PR End Diast Vel: 4.08 msec AV Vmax:  101.00 cm/s AV Vmean:          66.200 cm/s AV VTI:            0.162 m AV Peak Grad:      4.1 mmHg AV Mean Grad:      2.0 mmHg LVOT Vmax:         65.10 cm/s LVOT Vmean:        43.500 cm/s LVOT VTI:          0.107 m LVOT/AV VTI ratio: 0.66  AORTA Ao Root diam: 3.50 cm Ao Asc diam:  3.70 cm MITRAL VALVE               TRICUSPID VALVE MV Area (PHT): 2.99 cm    TR Peak grad:   14.9 mmHg MV Decel Time: 254 msec    TR Vmax:        193.00 cm/s MV E velocity: 54.40 cm/s MV A velocity: 55.00 cm/s  SHUNTS MV E/A ratio:  0.99        Systemic VTI:  0.11 m                            Systemic Diam: 2.40 cm Vishnu Priya Mallipeddi Electronically signed by Winfield Rast Mallipeddi Signature Date/Time: 05/18/2022/3:16:33 PM    Final    DG Chest Port 1 View  Result Date: 05/16/2022 CLINICAL DATA:  Chest pain. EXAM: PORTABLE CHEST 1 VIEW COMPARISON:  Most recent radiograph 04/30/2022. Most recent CT 04/05/2022 FINDINGS:  Lung volumes are low.The cardiomediastinal contours are normal. The lungs are clear. Pulmonary vasculature is normal. No consolidation, pleural effusion, or pneumothorax. No acute osseous abnormalities are seen. IMPRESSION: Hypoventilatory chest without acute abnormality. Electronically Signed   By: Narda Rutherford M.D.   On: 05/16/2022 21:49   US RENAL  Result Date: 05/08/2022 CLINICAL DATA:  Acute kidney injury. EXAM: RENAL / URINARY TRACT ULTRASOUND COMPLETE COMPARISON:  CT 02/12/2020 FINDINGS: Right Kidney: Renal measurements: 11 x 5.1 x 5.2 cm = volume: 154 mL. Normal parenchymal echogenicity. No hydronephrosis. No visualized stone or focal lesion. Left Kidney: Renal measurements: 10.8 x 5.1 x 5.4 cm = volume: 154 mL. Normal parenchymal echogenicity. No hydronephrosis. No visualized stone or focal lesion. Bladder: Appears normal for degree of bladder distention. Bladder volume is 300 cc. Other: None. IMPRESSION: Unremarkable sonographic appearance of the kidneys and bladder. Electronically Signed   By: Narda Rutherford M.D.   On: 05/08/2022 16:28   DG Chest Portable 1 View  Result Date: 04/30/2022 CLINICAL DATA:  Chest pain EXAM: PORTABLE CHEST 1 VIEW COMPARISON:  04/15/2022 FINDINGS: Transverse diameter of heart is slightly increased. This may be partly due to poor inspiration. Lung fields are clear of any infiltrates or pulmonary edema. There is no pleural effusion or pneumothorax. IMPRESSION: No active disease. Electronically Signed   By: Ernie Avena M.D.   On: 04/30/2022 11:58     Time coordinating discharge: Over 30 minutes    Lewie Chamber, MD  [facility] 05/21/2022, 2:57 PM

## 2022-05-21 NOTE — Plan of Care (Signed)
  Problem: Education: Goal: Ability to describe self-care measures that may prevent or decrease complications (Diabetes Survival Skills Education) will improve Outcome: Progressing   Problem: Fluid Volume: Goal: Ability to achieve a balanced intake and output will improve Outcome: Progressing   Problem: Metabolic: Goal: Ability to maintain appropriate glucose levels will improve Outcome: Progressing

## 2022-05-21 NOTE — Telephone Encounter (Signed)
Patient Advocate Encounter  Received notification that the request for prior authorization for  Linezolid 600MG  tablets has been denied due to must fail at least 2 formulary antibiotics.       Lyndel Safe, [place] Patient Advocate Specialist [place] Patient Advocate Team Direct Number: [phone]  Fax: [phone]

## 2022-05-21 NOTE — Plan of Care (Signed)
  Problem: Education: Goal: Ability to describe self-care measures that may prevent or decrease complications (Diabetes Survival Skills Education) will improve 05/21/2022 1720 by Iver Nestle A, LPN Outcome: Completed/Met 05/21/2022 1250 by Lise Auer, LPN Outcome: Progressing   Problem: Health Behavior/Discharge Planning: Goal: Ability to identify and utilize available resources and services will improve Outcome: Completed/Met Goal: Ability to manage health-related needs will improve Outcome: Completed/Met   Problem: Fluid Volume: Goal: Ability to achieve a balanced intake and output will improve 05/21/2022 1720 by Iver Nestle A, LPN Outcome: Completed/Met 05/21/2022 1250 by Iver Nestle A, LPN Outcome: Progressing   Problem: Metabolic: Goal: Ability to maintain appropriate glucose levels will improve 05/21/2022 1720 by Iver Nestle A, LPN Outcome: Completed/Met 05/21/2022 1250 by Iver Nestle A, LPN Outcome: Progressing   Problem: Nutritional: Goal: Maintenance of adequate nutrition will improve Outcome: Completed/Met Goal: Maintenance of adequate weight for body size and type will improve Outcome: Completed/Met   Problem: Respiratory: Goal: Will regain and/or maintain adequate ventilation Outcome: Completed/Met   Problem: Urinary Elimination: Goal: Ability to achieve and maintain adequate renal perfusion and functioning will improve Outcome: Completed/Met   Problem: Education: Goal: Ability to describe self-care measures that may prevent or decrease complications (Diabetes Survival Skills Education) will improve Outcome: Completed/Met   Problem: Coping: Goal: Ability to adjust to condition or change in health will improve Outcome: Completed/Met   Problem: Fluid Volume: Goal: Ability to maintain a balanced intake and output will improve Outcome: Completed/Met   Problem: Health Behavior/Discharge Planning: Goal: Ability to identify and utilize  available resources and services will improve Outcome: Completed/Met Goal: Ability to manage health-related needs will improve Outcome: Completed/Met   Problem: Metabolic: Goal: Ability to maintain appropriate glucose levels will improve Outcome: Completed/Met   Problem: Nutritional: Goal: Maintenance of adequate nutrition will improve Outcome: Completed/Met Goal: Progress toward achieving an optimal weight will improve Outcome: Completed/Met   Problem: Skin Integrity: Goal: Risk for impaired skin integrity will decrease Outcome: Completed/Met   Problem: Tissue Perfusion: Goal: Adequacy of tissue perfusion will improve Outcome: Completed/Met   Problem: Education: Goal: Knowledge of General Education information will improve Description: Including pain rating scale, medication(s)/side effects and non-pharmacologic comfort measures Outcome: Completed/Met   Problem: Health Behavior/Discharge Planning: Goal: Ability to manage health-related needs will improve Outcome: Completed/Met   Problem: Clinical Measurements: Goal: Ability to maintain clinical measurements within normal limits will improve Outcome: Completed/Met Goal: Will remain free from infection Outcome: Completed/Met Goal: Diagnostic test results will improve Outcome: Completed/Met Goal: Respiratory complications will improve Outcome: Completed/Met Goal: Cardiovascular complication will be avoided Outcome: Completed/Met   Problem: Activity: Goal: Risk for activity intolerance will decrease Outcome: Completed/Met   Problem: Nutrition: Goal: Adequate nutrition will be maintained Outcome: Completed/Met   Problem: Coping: Goal: Level of anxiety will decrease Outcome: Completed/Met   Problem: Elimination: Goal: Will not experience complications related to bowel motility Outcome: Completed/Met Goal: Will not experience complications related to urinary retention Outcome: Completed/Met   Problem: Pain  Managment: Goal: General experience of comfort will improve Outcome: Completed/Met   Problem: Safety: Goal: Ability to remain free from injury will improve Outcome: Completed/Met   Problem: Skin Integrity: Goal: Risk for impaired skin integrity will decrease Outcome: Completed/Met

## 2022-05-22 ENCOUNTER — Other Ambulatory Visit (HOSPITAL_COMMUNITY): Payer: Self-pay

## 2022-05-22 NOTE — Progress Notes (Signed)
Alerted by RN that pt requesting assistance with bus pass to get him back to [place].  Informed RN that [facility] can supply bus pass to get patient to dep[place], however, cannot assist with further bus travel.  Bus pass given to RN to provide to patient.  Rakel Junio, LCSW

## 2022-05-23 LAB — CULTURE, BLOOD (ROUTINE X 2)
Culture: NO GROWTH
Culture: NO GROWTH
Special Requests: ADEQUATE
Special Requests: ADEQUATE

## 2022-05-23 SURGERY — ECHOCARDIOGRAM, TRANSESOPHAGEAL
Anesthesia: Monitor Anesthesia Care

## 2022-05-24 LAB — CULTURE, BLOOD (ROUTINE X 2)
Culture: NO GROWTH
Culture: NO GROWTH
Special Requests: ADEQUATE
Special Requests: ADEQUATE

## 2022-05-26 ENCOUNTER — Other Ambulatory Visit: Payer: Self-pay

## 2022-05-26 ENCOUNTER — Emergency Department (HOSPITAL_COMMUNITY): Payer: No Typology Code available for payment source

## 2022-05-26 ENCOUNTER — Emergency Department (HOSPITAL_COMMUNITY)
Admission: EM | Admit: 2022-05-26 | Discharge: 2022-05-27 | Discharge status: Against Medical Advice | Payer: No Typology Code available for payment source | Attending: Student | Admitting: Student

## 2022-05-26 DIAGNOSIS — R079 Chest pain, unspecified: Secondary | ICD-10-CM | POA: Diagnosis not present

## 2022-05-26 DIAGNOSIS — R519 Headache, unspecified: Secondary | ICD-10-CM | POA: Diagnosis not present

## 2022-05-26 DIAGNOSIS — Z5321 Procedure and treatment not carried out due to patient leaving prior to being seen by health care provider: Secondary | ICD-10-CM | POA: Diagnosis not present

## 2022-05-26 LAB — CBG MONITORING, ED: Glucose-Capillary: 74 mg/dL (ref 70–99)
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
## 2022-05-26 NOTE — Care Plan (Signed)
Went to waiting room and could not locate pt. For CT. Nurse Tech also unable to locate the pt.

## 2022-05-27 LAB — COMPREHENSIVE METABOLIC PANEL
ALT: 14 U/L (ref 0–44)
AST: 22 U/L (ref 15–41)
Albumin: 3.9 g/dL (ref 3.5–5.0)
Alkaline Phosphatase: 52 U/L (ref 38–126)
Anion gap: 14 (ref 5–15)
BUN: 10 mg/dL (ref 6–20)
CO2: 22 mmol/L (ref 22–32)
Calcium: 9.6 mg/dL (ref 8.9–10.3)
Chloride: 104 mmol/L (ref 98–111)
Creatinine, Ser: 1.1 mg/dL (ref 0.61–1.24)
GFR, Estimated: >60 mL/min (ref >60)
Glucose, Bld: 73 mg/dL (ref 70–99)
Potassium: 3.5 mmol/L (ref 3.5–5.1)
Sodium: 140 mmol/L (ref 135–145)
Total Bilirubin: 0.5 mg/dL (ref 0.3–1.2)
Total Protein: 7.3 g/dL (ref 6.5–8.1)

## 2022-05-27 LAB — CBC WITH DIFFERENTIAL/PLATELET
Abs Immature Granulocytes: 0.02 10*3/uL (ref 0.00–0.07)
Basophils Absolute: 0 10*3/uL (ref 0.0–0.1)
Basophils Relative: 1 %
Eosinophils Absolute: 0.1 10*3/uL (ref 0.0–0.5)
Eosinophils Relative: 1 %
HCT: 44.8 % (ref 39.0–52.0)
Hemoglobin: 15 g/dL (ref 13.0–17.0)
Immature Granulocytes: 0 %
Lymphocytes Relative: 32 %
Lymphs Abs: 2.6 10*3/uL (ref 0.7–4.0)
MCH: 28.6 pg (ref 26.0–34.0)
MCHC: 33.5 g/dL (ref 30.0–36.0)
MCV: 85.5 fL (ref 80.0–100.0)
Monocytes Absolute: 0.8 10*3/uL (ref 0.1–1.0)
Monocytes Relative: 9 %
Neutro Abs: 4.7 10*3/uL (ref 1.7–7.7)
Neutrophils Relative %: 57 %
Platelets: 277 10*3/uL (ref 150–400)
RBC: 5.24 MIL/uL (ref 4.22–5.81)
RDW: 12.8 % (ref 11.5–15.5)
WBC: 8.2 10*3/uL (ref 4.0–10.5)
nRBC: 0 % (ref 0.0–0.2)

## 2022-05-27 LAB — TROPONIN I (HIGH SENSITIVITY): Troponin I (High Sensitivity): 8 ng/L (ref <18)

## 2022-05-27 NOTE — ED Notes (Signed)
X1 no response for vitals recheck 

## 2022-05-27 NOTE — ED Notes (Signed)
X2 no response °

## 2022-05-30 ENCOUNTER — Other Ambulatory Visit (HOSPITAL_COMMUNITY): Payer: Self-pay

## 2022-06-02 ENCOUNTER — Encounter (HOSPITAL_COMMUNITY): Payer: Self-pay | Admitting: *Deleted

## 2022-06-02 ENCOUNTER — Emergency Department (HOSPITAL_COMMUNITY): Payer: No Typology Code available for payment source

## 2022-06-02 ENCOUNTER — Emergency Department (HOSPITAL_COMMUNITY)
Admission: EM | Admit: 2022-06-02 | Discharge: 2022-06-02 | Disposition: A | Discharge status: Home / Self Care | Payer: No Typology Code available for payment source | Attending: Emergency Medicine | Admitting: Emergency Medicine

## 2022-06-02 DIAGNOSIS — S022XXA Fracture of nasal bones, initial encounter for closed fracture: Secondary | ICD-10-CM

## 2022-06-02 DIAGNOSIS — S299XXA Unspecified injury of thorax, initial encounter: Secondary | ICD-10-CM | POA: Insufficient documentation

## 2022-06-02 DIAGNOSIS — S3991XA Unspecified injury of abdomen, initial encounter: Secondary | ICD-10-CM | POA: Insufficient documentation

## 2022-06-02 DIAGNOSIS — Y92481 Parking lot as the place of occurrence of the external cause: Secondary | ICD-10-CM | POA: Insufficient documentation

## 2022-06-02 DIAGNOSIS — R7309 Other abnormal glucose: Secondary | ICD-10-CM | POA: Insufficient documentation

## 2022-06-02 DIAGNOSIS — R040 Epistaxis: Secondary | ICD-10-CM | POA: Diagnosis not present

## 2022-06-02 DIAGNOSIS — Z23 Encounter for immunization: Secondary | ICD-10-CM | POA: Diagnosis not present

## 2022-06-02 DIAGNOSIS — S0992XA Unspecified injury of nose, initial encounter: Secondary | ICD-10-CM | POA: Diagnosis present

## 2022-06-02 HISTORY — DX: Essential (primary) hypertension: I10

## 2022-06-02 LAB — ETHANOL: Alcohol, Ethyl (B): <10 mg/dL (ref <10)

## 2022-06-02 LAB — COMPREHENSIVE METABOLIC PANEL
ALT: 16 U/L (ref 0–44)
AST: 19 U/L (ref 15–41)
Albumin: 3.3 g/dL — ABNORMAL LOW (ref 3.5–5.0)
Alkaline Phosphatase: 52 U/L (ref 38–126)
Anion gap: 11 (ref 5–15)
BUN: 10 mg/dL (ref 6–20)
CO2: 20 mmol/L — ABNORMAL LOW (ref 22–32)
Calcium: 8.9 mg/dL (ref 8.9–10.3)
Chloride: 99 mmol/L (ref 98–111)
Creatinine, Ser: 0.98 mg/dL (ref 0.61–1.24)
GFR, Estimated: >60 mL/min (ref >60)
Glucose, Bld: 411 mg/dL — ABNORMAL HIGH (ref 70–99)
Potassium: 4.8 mmol/L (ref 3.5–5.1)
Sodium: 130 mmol/L — ABNORMAL LOW (ref 135–145)
Total Bilirubin: 0.7 mg/dL (ref 0.3–1.2)
Total Protein: 6.2 g/dL — ABNORMAL LOW (ref 6.5–8.1)

## 2022-06-02 LAB — CBC WITH DIFFERENTIAL/PLATELET
Abs Immature Granulocytes: 0.02 10*3/uL (ref 0.00–0.07)
Basophils Absolute: 0 10*3/uL (ref 0.0–0.1)
Basophils Relative: 1 %
Eosinophils Absolute: 0.1 10*3/uL (ref 0.0–0.5)
Eosinophils Relative: 2 %
HCT: 41.7 % (ref 39.0–52.0)
Hemoglobin: 14.1 g/dL (ref 13.0–17.0)
Immature Granulocytes: 0 %
Lymphocytes Relative: 39 %
Lymphs Abs: 2.3 10*3/uL (ref 0.7–4.0)
MCH: 28.9 pg (ref 26.0–34.0)
MCHC: 33.8 g/dL (ref 30.0–36.0)
MCV: 85.5 fL (ref 80.0–100.0)
Monocytes Absolute: 0.4 10*3/uL (ref 0.1–1.0)
Monocytes Relative: 7 %
Neutro Abs: 3 10*3/uL (ref 1.7–7.7)
Neutrophils Relative %: 51 %
Platelets: 217 10*3/uL (ref 150–400)
RBC: 4.88 MIL/uL (ref 4.22–5.81)
RDW: 13.1 % (ref 11.5–15.5)
WBC: 5.9 10*3/uL (ref 4.0–10.5)
nRBC: 0 % (ref 0.0–0.2)

## 2022-06-02 MED ORDER — HALOPERIDOL LACTATE 5 MG/ML IJ SOLN
10.0000 mg | Freq: Once | INTRAMUSCULAR | Status: AC
  Administered 2022-06-02: 10 mg via INTRAVENOUS
  Filled 2022-06-02: qty 2

## 2022-06-02 MED ORDER — CEFAZOLIN SODIUM-DEXTROSE 2-4 GM/100ML-% IV SOLN
2.0000 g | Freq: Once | INTRAVENOUS | Status: AC
  Administered 2022-06-02: 2 g via INTRAVENOUS

## 2022-06-02 MED ORDER — TETANUS-DIPHTH-ACELL PERTUSSIS 5-2.5-18.5 LF-MCG/0.5 IM SUSY
0.5000 mL | PREFILLED_SYRINGE | Freq: Once | INTRAMUSCULAR | Status: AC
  Administered 2022-06-02: 0.5 mL via INTRAMUSCULAR

## 2022-06-02 MED ORDER — LORAZEPAM 2 MG/ML IJ SOLN
INTRAMUSCULAR | Status: AC
  Administered 2022-06-02: 2 mg
  Filled 2022-06-02: qty 1

## 2022-06-02 MED ORDER — IOHEXOL 350 MG/ML SOLN
75.0000 mL | Freq: Once | INTRAVENOUS | Status: AC | PRN
  Administered 2022-06-02: 75 mL via INTRAVENOUS
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
## 2022-06-02 NOTE — ED Notes (Signed)
Pt became agitated, removed his collar even though it was discussed the importance of the collar. He was saying he was going to leave. Edp notified, orders for ativan rec'd

## 2022-06-02 NOTE — Progress Notes (Signed)
Orthopedic Tech Progress Note Patient Details:  [name] [name] 07/29/1985 [id number]  Patient ID: [name] [name], male   DOB: 07/29/1985, 36 y.o.   MRN: [id number] I attended trauma page. Karolee Stamps 06/02/2022, 2:54 AM

## 2022-06-03 ENCOUNTER — Encounter (HOSPITAL_COMMUNITY): Payer: Self-pay

## 2022-06-03 LAB — I-STAT CHEM 8, ED
BUN: 7 mg/dL (ref 6–20)
Calcium, Ion: 1.12 mmol/L — ABNORMAL LOW (ref 1.15–1.40)
Chloride: 98 mmol/L (ref 98–111)
Creatinine, Ser: 1.1 mg/dL (ref 0.61–1.24)
Glucose, Bld: 357 mg/dL — ABNORMAL HIGH (ref 70–99)
HCT: 46 % (ref 39.0–52.0)
Hemoglobin: 15.6 g/dL (ref 13.0–17.0)
Potassium: 4.2 mmol/L (ref 3.5–5.1)
Sodium: 135 mmol/L (ref 135–145)
TCO2: 25 mmol/L (ref 22–32)

## 2022-06-03 LAB — CBG MONITORING, ED: Glucose-Capillary: 328 mg/dL — ABNORMAL HIGH (ref 70–99)

## 2022-06-05 ENCOUNTER — Other Ambulatory Visit (HOSPITAL_COMMUNITY): Payer: Self-pay

## 2022-06-09 ENCOUNTER — Emergency Department (HOSPITAL_COMMUNITY)
Admission: EM | Admit: 2022-06-09 | Discharge: 2022-06-09 | Disposition: A | Discharge status: Home / Self Care | Payer: No Typology Code available for payment source | Attending: Emergency Medicine | Admitting: Emergency Medicine

## 2022-06-09 ENCOUNTER — Other Ambulatory Visit: Payer: Self-pay

## 2022-06-09 ENCOUNTER — Emergency Department (HOSPITAL_COMMUNITY): Payer: No Typology Code available for payment source

## 2022-06-09 ENCOUNTER — Encounter (HOSPITAL_COMMUNITY): Payer: Self-pay

## 2022-06-09 DIAGNOSIS — R000 Tachycardia, unspecified: Secondary | ICD-10-CM | POA: Diagnosis not present

## 2022-06-09 DIAGNOSIS — R739 Hyperglycemia, unspecified: Secondary | ICD-10-CM

## 2022-06-09 DIAGNOSIS — R0602 Shortness of breath: Secondary | ICD-10-CM | POA: Insufficient documentation

## 2022-06-09 DIAGNOSIS — Z794 Long term (current) use of insulin: Secondary | ICD-10-CM | POA: Insufficient documentation

## 2022-06-09 DIAGNOSIS — R072 Precordial pain: Secondary | ICD-10-CM | POA: Diagnosis not present

## 2022-06-09 DIAGNOSIS — Z79899 Other long term (current) drug therapy: Secondary | ICD-10-CM | POA: Diagnosis not present

## 2022-06-09 DIAGNOSIS — E1165 Type 2 diabetes mellitus with hyperglycemia: Secondary | ICD-10-CM | POA: Diagnosis not present

## 2022-06-09 DIAGNOSIS — R944 Abnormal results of kidney function studies: Secondary | ICD-10-CM | POA: Insufficient documentation

## 2022-06-09 DIAGNOSIS — R030 Elevated blood-pressure reading, without diagnosis of hypertension: Secondary | ICD-10-CM | POA: Insufficient documentation

## 2022-06-09 DIAGNOSIS — R0789 Other chest pain: Secondary | ICD-10-CM

## 2022-06-09 LAB — CBG MONITORING, ED
Glucose-Capillary: 463 mg/dL — ABNORMAL HIGH (ref 70–99)
Glucose-Capillary: 383 mg/dL — ABNORMAL HIGH (ref 70–99)
Glucose-Capillary: 342 mg/dL — ABNORMAL HIGH (ref 70–99)
Glucose-Capillary: 361 mg/dL — ABNORMAL HIGH (ref 70–99)

## 2022-06-09 LAB — COMPREHENSIVE METABOLIC PANEL
ALT: 12 U/L (ref 0–44)
AST: 14 U/L — ABNORMAL LOW (ref 15–41)
Albumin: 3.8 g/dL (ref 3.5–5.0)
Alkaline Phosphatase: 74 U/L (ref 38–126)
Anion gap: 12 (ref 5–15)
BUN: 11 mg/dL (ref 6–20)
CO2: 25 mmol/L (ref 22–32)
Calcium: 9.8 mg/dL (ref 8.9–10.3)
Chloride: 93 mmol/L — ABNORMAL LOW (ref 98–111)
Creatinine, Ser: 1.31 mg/dL — ABNORMAL HIGH (ref 0.61–1.24)
GFR, Estimated: >60 mL/min (ref >60)
Glucose, Bld: 516 mg/dL (ref 70–99)
Potassium: 4.9 mmol/L (ref 3.5–5.1)
Sodium: 130 mmol/L — ABNORMAL LOW (ref 135–145)
Total Bilirubin: 0.9 mg/dL (ref 0.3–1.2)
Total Protein: 7.1 g/dL (ref 6.5–8.1)

## 2022-06-09 LAB — CBC WITH DIFFERENTIAL/PLATELET
Abs Immature Granulocytes: 0.01 10*3/uL (ref 0.00–0.07)
Basophils Absolute: 0 10*3/uL (ref 0.0–0.1)
Basophils Relative: 1 %
Eosinophils Absolute: 0.1 10*3/uL (ref 0.0–0.5)
Eosinophils Relative: 1 %
HCT: 47 % (ref 39.0–52.0)
Hemoglobin: 15.7 g/dL (ref 13.0–17.0)
Immature Granulocytes: 0 %
Lymphocytes Relative: 36 %
Lymphs Abs: 1.9 10*3/uL (ref 0.7–4.0)
MCH: 29 pg (ref 26.0–34.0)
MCHC: 33.4 g/dL (ref 30.0–36.0)
MCV: 86.9 fL (ref 80.0–100.0)
Monocytes Absolute: 0.5 10*3/uL (ref 0.1–1.0)
Monocytes Relative: 9 %
Neutro Abs: 2.7 10*3/uL (ref 1.7–7.7)
Neutrophils Relative %: 53 %
Platelets: 219 10*3/uL (ref 150–400)
RBC: 5.41 MIL/uL (ref 4.22–5.81)
RDW: 14.2 % (ref 11.5–15.5)
WBC: 5.1 10*3/uL (ref 4.0–10.5)
nRBC: 0 % (ref 0.0–0.2)

## 2022-06-09 LAB — I-STAT VENOUS BLOOD GAS, ED
Acid-Base Excess: 4 mmol/L — ABNORMAL HIGH (ref 0.0–2.0)
Bicarbonate: 26.7 mmol/L (ref 20.0–28.0)
Calcium, Ion: 1.06 mmol/L — ABNORMAL LOW (ref 1.15–1.40)
HCT: 47 % (ref 39.0–52.0)
Hemoglobin: 16 g/dL (ref 13.0–17.0)
O2 Saturation: 98 %
Potassium: 4.7 mmol/L (ref 3.5–5.1)
Sodium: 128 mmol/L — ABNORMAL LOW (ref 135–145)
TCO2: 28 mmol/L (ref 22–32)
pCO2, Ven: 32.3 mmHg — ABNORMAL LOW (ref 44–60)
pH, Ven: 7.526 — ABNORMAL HIGH (ref 7.25–7.43)
pO2, Ven: 96 mmHg — ABNORMAL HIGH (ref 32–45)

## 2022-06-09 LAB — TROPONIN I (HIGH SENSITIVITY)
Troponin I (High Sensitivity): 8 ng/L (ref <18)
Troponin I (High Sensitivity): 7 ng/L (ref <18)

## 2022-06-09 LAB — LIPASE, BLOOD: Lipase: 38 U/L (ref 11–51)

## 2022-06-09 MED ORDER — LISINOPRIL 10 MG PO TABS: 10.0000 mg | ORAL_TABLET | Freq: Every day | ORAL | 1 refills | Status: DC

## 2022-06-09 MED ORDER — AMLODIPINE BESYLATE 10 MG PO TABS: 10.0000 mg | ORAL_TABLET | Freq: Every day | ORAL | 1 refills | Status: DC

## 2022-06-09 MED ORDER — INSULIN REGULAR HUMAN 100 UNIT/ML IJ SOLN: INTRAMUSCULAR | 11 refills | Status: DC

## 2022-06-09 MED ORDER — LACTATED RINGERS IV BOLUS
1000.0000 mL | Freq: Once | INTRAVENOUS | Status: AC
  Administered 2022-06-09: 1000 mL via INTRAVENOUS

## 2022-06-09 MED ORDER — NOVOLIN 70/30 FLEXPEN (70-30) 100 UNIT/ML ~~LOC~~ SUPN: 28.0000 [IU] | PEN_INJECTOR | Freq: Two times a day (BID) | SUBCUTANEOUS | 0 refills | Status: DC

## 2022-06-09 NOTE — Discharge Instructions (Addendum)
Prescriptions have been renewed.  Restart your diabetic medicine and her antihypertensive medicine.  Follow-up with your primary care doctor if not wellness clinic referral information provided give them a call for follow-up.
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
## 2022-06-09 NOTE — ED Notes (Signed)
Glucose   516 called from lab- Teg[place] MD notified.

## 2022-06-09 NOTE — ED Provider Notes (Addendum)
Patient's initial troponin was 8.  Since chest pain started while here in the ED we will get a delta troponin.  There was a bit of confusion about that where delta troponins were not ordered.  Have ordered the second troponin.  We will also have him check a finger stick blood sugar again.  Patient's been resting here comfortably.   Vanetta Mulders, MD 06/09/22 2144  Patient's delta troponin still pending.  But nurse definitely knows that it was required and it is showing in process.  Repeat blood sugar 342 patient holding his own on that.  Suspect patient will be discharged home will need follow-up for the blood sugars.  Patient states he does not have any medications for the blood sugars.  We will put in prescriptions.     Vanetta Mulders, MD 06/09/22 2251   Patient's delta troponin was 7.  First troponin was 8 no significant change.  Patient stable for discharge home.  Have renewed all his antihypertensive meds and his diabetic meds.  They just were renewed by Dr. Particia Nearing at the beginning of November but he claims he did not pick them up.  Also given referral to wellness clinic to help control his diabetes.   Vanetta Mulders, MD 06/09/22 [phone]

## 2022-06-09 NOTE — ED Provider Triage Note (Signed)
Emergency Medicine Provider Triage Evaluation Note  [name] [name] , a 36 y.o. male  was evaluated in triage.  Pt complains of hyperglycemia.  Has not been able to take his insulin the last 5 days.  He admits to polyuria, polydipsia.  No recent infectious symptoms.  He feels like his blood sugar is high however is not checked it over the last 5 days.  Review of Systems  Positive: hyperglycemia Negative:   Physical Exam  BP (!) 142/101 (BP Location: Right Arm)   Pulse (!) 107   Temp (!) 97.5 F (36.4 C)   Resp 16   Ht 5\' 11"  (1.803 m)   Wt 99.8 kg   SpO2 100%   BMI 30.68 kg/m  Gen:   Awake, no distress   Resp:  Normal effort  MSK:   Moves extremities without difficulty  Other:    Medical Decision Making  Medically screening exam initiated at 11:10 AM.  Appropriate orders placed.  [name] [name] was informed that the remainder of the evaluation will be completed by another provider, this initial triage assessment does not replace that evaluation, and the importance of remaining in the ED until their evaluation is complete.  Hyperglycemia   Gerald Kuehl A, PA-C 06/09/22 1112

## 2022-06-09 NOTE — ED Notes (Signed)
Pt discharged home. Discharge instructions provided by this RN and pt verbalized understanding. Prescriptions reviewed and pt educated to take physical prescription to pharmacy of choosing to get medication filled. AOX4. Respirations even and unlabored at room air with no distress noted. Ambulatory to ED lobby with strong and steady gait.

## 2022-06-09 NOTE — ED Provider Notes (Signed)
[facility] EMERGENCY DEPARTMENT Provider Note   CSN: [id number] Arrival date & time: 06/09/22  1103     History  Chief Complaint  Patient presents with   Hyperglycemia    [name] [name] is a 36 y.o. male.   Hyperglycemia Associated symptoms: chest pain, increased thirst and polyuria      36 year old male with medical history significant for diabetes mellitus and DKA and HHS who presents to the emergency department with polyuria and polydipsia.  The patient states that he has been out of his diabetes medications for the past 5 days as he states that he has been able to get into his home.  He states that his blood sugar is high but he has not checked it over the past 5 days.  He is worried about DKA.  He additionally states that in the emergency department he has developed substernal chest discomfort, no radiation, mild associated shortness of breath.  No fevers or chills.  No cough.  He denies any abdominal pain, nausea, vomiting, diarrhea or dysuria.  Home Medications Prior to Admission medications   Medication Sig Start Date End Date Taking? Authorizing Provider  amLODipine (NORVASC) 10 MG tablet Take 1 tablet (10 mg total) by mouth daily. 04/30/22   Isla Pence, MD  cefadroxil (DURICEF) 500 MG capsule Take 2 capsules (1,000 mg total) by mouth 3 (three) times daily for 28 days. 05/28/22 06/25/22  Vu, Johnny Bridge T, MD  insulin isophane & regular human KwikPen (NOVOLIN 70/30 KWIKPEN) (70-30) 100 UNIT/ML KwikPen Inject 28 Units into the skin 2 (two) times daily. 05/18/22 06/17/22  Antonieta Pert, MD  insulin regular (HUMULIN R) 100 units/mL injection As per sliding scale 05/10/22   Shawna Clamp, MD  lisinopril (ZESTRIL) 10 MG tablet Take 1 tablet (10 mg total) by mouth daily. 04/30/22   Isla Pence, MD      Allergies    Patient has no known allergies.    Review of Systems   Review of Systems  Cardiovascular:  Positive for chest pain.  Endocrine: Positive for  polydipsia and polyuria.  All other systems reviewed and are negative.   Physical Exam Updated Vital Signs BP (!) 166/118   Pulse 89   Temp (!) 97.5 F (36.4 C)   Resp 16   Ht 5\' 11"  (1.803 m)   Wt 99.8 kg   SpO2 97%   BMI 30.68 kg/m  Physical Exam Vitals and nursing note reviewed.  Constitutional:      General: He is not in acute distress.    Appearance: He is well-developed.  HENT:     Head: Normocephalic and atraumatic.     Mouth/Throat:     Mouth: Mucous membranes are dry.  Eyes:     Conjunctiva/sclera: Conjunctivae normal.  Cardiovascular:     Rate and Rhythm: Normal rate and regular rhythm.  Pulmonary:     Effort: Pulmonary effort is normal. No respiratory distress.     Breath sounds: Normal breath sounds.  Abdominal:     Palpations: Abdomen is soft.     Tenderness: There is no abdominal tenderness.  Musculoskeletal:        General: No swelling.     Cervical back: Neck supple.  Skin:    General: Skin is warm and dry.     Capillary Refill: Capillary refill takes less than 2 seconds.  Neurological:     Mental Status: He is alert.  Psychiatric:        Mood and Affect: Mood  normal.     ED Results / Procedures / Treatments   Labs (all labs ordered are listed, but only abnormal results are displayed) Labs Reviewed  COMPREHENSIVE METABOLIC PANEL - Abnormal; Notable for the following components:      Result Value   Sodium 130 (*)    Chloride 93 (*)    Glucose, Bld 516 (*)    Creatinine, Ser 1.31 (*)    AST 14 (*)    All other components within normal limits  CBG MONITORING, ED - Abnormal; Notable for the following components:   Glucose-Capillary 463 (*)    All other components within normal limits  I-STAT VENOUS BLOOD GAS, ED - Abnormal; Notable for the following components:   pH, Ven 7.526 (*)    pCO2, Ven 32.3 (*)    pO2, Ven 96 (*)    Acid-Base Excess 4.0 (*)    Sodium 128 (*)    Calcium, Ion 1.06 (*)    All other components within normal limits   CBC WITH DIFFERENTIAL/PLATELET  URINALYSIS, ROUTINE W REFLEX MICROSCOPIC  LIPASE, BLOOD  TROPONIN I (HIGH SENSITIVITY)    EKG None  Radiology DG Chest Portable 1 View  Result Date: 06/09/2022 CLINICAL DATA:  Chest pain EXAM: PORTABLE CHEST 1 VIEW COMPARISON:  05/16/2022 FINDINGS: The heart size and mediastinal contours are within normal limits. Both lungs are clear. The visualized skeletal structures are unremarkable. IMPRESSION: No active disease. Electronically Signed   By: Elmer Picker M.D.   On: 06/09/2022 15:15    Procedures Procedures    Medications Ordered in ED Medications  lactated ringers bolus 1,000 mL (1,000 mLs Intravenous New Bag/Given 06/09/22 1428)    ED Course/ Medical Decision Making/ A&P                           Medical Decision Making Amount and/or Complexity of Data Reviewed Radiology: ordered.    36 year old male with medical history significant for diabetes mellitus and DKA and HHS who presents to the emergency department with polyuria and polydipsia.  The patient states that he has been out of his diabetes medications for the past 5 days as he states that he has been able to get into his home.  He states that his blood sugar is high but he has not checked it over the past 5 days.  He is worried about DKA.  He additionally states that in the emergency department he has developed substernal chest discomfort, no radiation, mild associated shortness of breath.  No fevers or chills.  No cough.  He denies any abdominal pain, nausea, vomiting, diarrhea or dysuria.  On arrival, the patient was vitally stable, mildly tachycardic, P107, sinus rhythm noted on cardiac telemetry my evaluation, BP 142/101, saturating 100% on room air, afebrile, temperature 97.5.  Presenting with polyuria and polydipsia with known diabetes and lack of access to his insulin at home.  CBG was initially 516.  IV access was obtained and after the patient was roomed the patient was  administered a 1 L IV fluid bolus.  He complains of mild chest discomfort, EKG, chest x-ray, troponin initially ordered.  He does have some mild reproducible chest wall tenderness on exam in the epigastric region.  Lipase and troponin ordered.  Initial CMP revealed hyperglycemia to 516, evidence of AKI with elevated serum creatinine to 1.31, pH not acidotic with a pH of 7.53, repeat CBG downtrending to 463, CBC without leukocytosis or anemia.  No evidence  for DKA or HHS at this time.  Lower concern for ACS.  Patient overall well-appearing, at baseline mental status.  Plan at time of signout to reassess the patient following fluids, laboratory evaluation, disposition pending.  Signout given to Dr. Deretha Emory at [phone].   Final Clinical Impression(s) / ED Diagnoses Final diagnoses:  Hyperglycemia    Rx / DC Orders ED Discharge Orders     None         Ernie Avena, MD 06/09/22 1542

## 2022-06-10 ENCOUNTER — Emergency Department (HOSPITAL_COMMUNITY)
Admission: EM | Admit: 2022-06-10 | Discharge: 2022-06-11 | Discharge status: Against Medical Advice | Payer: No Typology Code available for payment source | Attending: Emergency Medicine | Admitting: Emergency Medicine

## 2022-06-10 ENCOUNTER — Encounter (HOSPITAL_COMMUNITY): Payer: Self-pay

## 2022-06-10 ENCOUNTER — Other Ambulatory Visit: Payer: Self-pay

## 2022-06-10 DIAGNOSIS — Z5321 Procedure and treatment not carried out due to patient leaving prior to being seen by health care provider: Secondary | ICD-10-CM | POA: Insufficient documentation

## 2022-06-10 DIAGNOSIS — Z794 Long term (current) use of insulin: Secondary | ICD-10-CM | POA: Diagnosis not present

## 2022-06-10 DIAGNOSIS — M545 Low back pain, unspecified: Secondary | ICD-10-CM | POA: Insufficient documentation

## 2022-06-10 DIAGNOSIS — E1165 Type 2 diabetes mellitus with hyperglycemia: Secondary | ICD-10-CM | POA: Diagnosis not present

## 2022-06-10 DIAGNOSIS — E119 Type 2 diabetes mellitus without complications: Secondary | ICD-10-CM | POA: Insufficient documentation

## 2022-06-10 DIAGNOSIS — Z79899 Other long term (current) drug therapy: Secondary | ICD-10-CM | POA: Insufficient documentation

## 2022-06-10 DIAGNOSIS — R42 Dizziness and giddiness: Secondary | ICD-10-CM | POA: Insufficient documentation

## 2022-06-10 DIAGNOSIS — R000 Tachycardia, unspecified: Secondary | ICD-10-CM | POA: Diagnosis not present

## 2022-06-10 LAB — CBC WITH DIFFERENTIAL/PLATELET
Abs Immature Granulocytes: 0.02 10*3/uL (ref 0.00–0.07)
Basophils Absolute: 0 10*3/uL (ref 0.0–0.1)
Basophils Relative: 1 %
Eosinophils Absolute: 0.1 10*3/uL (ref 0.0–0.5)
Eosinophils Relative: 1 %
HCT: 44.7 % (ref 39.0–52.0)
Hemoglobin: 15.3 g/dL (ref 13.0–17.0)
Immature Granulocytes: 0 %
Lymphocytes Relative: 39 %
Lymphs Abs: 2.2 10*3/uL (ref 0.7–4.0)
MCH: 29.5 pg (ref 26.0–34.0)
MCHC: 34.2 g/dL (ref 30.0–36.0)
MCV: 86.1 fL (ref 80.0–100.0)
Monocytes Absolute: 0.6 10*3/uL (ref 0.1–1.0)
Monocytes Relative: 10 %
Neutro Abs: 2.8 10*3/uL (ref 1.7–7.7)
Neutrophils Relative %: 49 %
Platelets: 195 10*3/uL (ref 150–400)
RBC: 5.19 MIL/uL (ref 4.22–5.81)
RDW: 13.9 % (ref 11.5–15.5)
WBC: 5.7 10*3/uL (ref 4.0–10.5)
nRBC: 0 % (ref 0.0–0.2)

## 2022-06-10 LAB — COMPREHENSIVE METABOLIC PANEL
ALT: 12 U/L (ref 0–44)
AST: 15 U/L (ref 15–41)
Albumin: 3.6 g/dL (ref 3.5–5.0)
Alkaline Phosphatase: 57 U/L (ref 38–126)
Anion gap: 14 (ref 5–15)
BUN: 11 mg/dL (ref 6–20)
CO2: 22 mmol/L (ref 22–32)
Calcium: 9.3 mg/dL (ref 8.9–10.3)
Chloride: 93 mmol/L — ABNORMAL LOW (ref 98–111)
Creatinine, Ser: 1.19 mg/dL (ref 0.61–1.24)
GFR, Estimated: >60 mL/min (ref >60)
Glucose, Bld: 374 mg/dL — ABNORMAL HIGH (ref 70–99)
Potassium: 3.7 mmol/L (ref 3.5–5.1)
Sodium: 129 mmol/L — ABNORMAL LOW (ref 135–145)
Total Bilirubin: 1 mg/dL (ref 0.3–1.2)
Total Protein: 6.7 g/dL (ref 6.5–8.1)

## 2022-06-10 LAB — CBG MONITORING, ED: Glucose-Capillary: 370 mg/dL — ABNORMAL HIGH (ref 70–99)

## 2022-06-10 NOTE — ED Triage Notes (Signed)
Pt states his blood sugar has been going up and down and he has been feeling dizzy. Pt states he has been stressed recently and feels like it is effecting his blood sugar. Pt c/o pain in back.

## 2022-06-10 NOTE — ED Provider Triage Note (Signed)
Emergency Medicine Provider Triage Evaluation Note  [name] [name] , a 36 y.o. male  was evaluated in triage.  Pt complains of back pain.  Pt thinks his kidneys are failing.  Pt reports his medications for diabetes don't work because he is stressed out.    Review of Systems  Positive: Low back pain Negative: fever  Physical Exam  BP (!) 137/96 (BP Location: Right Arm)   Pulse 94   Temp 97.9 F (36.6 C)   Resp 16   Ht 5\' 11"  (1.803 m)   Wt 108.9 kg   SpO2 98%   BMI 33.47 kg/m  Gen:   Awake, no distress   Resp:  Normal effort  MSK:   Moves extremities without difficulty  Other:    Medical Decision Making  Medically screening exam initiated at 9:50 PM.  Appropriate orders placed.  RONSON HAGINS was informed that the remainder of the evaluation will be completed by another provider, this initial triage assessment does not replace that evaluation, and the importance of remaining in the ED until their evaluation is complete.     Barbette Or, Elson Areas 06/10/22 2152

## 2022-06-11 ENCOUNTER — Other Ambulatory Visit (HOSPITAL_COMMUNITY): Payer: Self-pay

## 2022-06-11 ENCOUNTER — Encounter (HOSPITAL_COMMUNITY): Payer: Self-pay

## 2022-06-11 ENCOUNTER — Emergency Department (HOSPITAL_COMMUNITY)
Admission: EM | Admit: 2022-06-11 | Discharge: 2022-06-11 | Disposition: A | Discharge status: Home / Self Care | Payer: No Typology Code available for payment source | Source: Home / Self Care | Attending: Emergency Medicine | Admitting: Emergency Medicine

## 2022-06-11 ENCOUNTER — Emergency Department (HOSPITAL_COMMUNITY): Payer: No Typology Code available for payment source

## 2022-06-11 DIAGNOSIS — Z794 Long term (current) use of insulin: Secondary | ICD-10-CM | POA: Insufficient documentation

## 2022-06-11 DIAGNOSIS — R42 Dizziness and giddiness: Secondary | ICD-10-CM | POA: Insufficient documentation

## 2022-06-11 DIAGNOSIS — R739 Hyperglycemia, unspecified: Secondary | ICD-10-CM | POA: Insufficient documentation

## 2022-06-11 DIAGNOSIS — R000 Tachycardia, unspecified: Secondary | ICD-10-CM | POA: Insufficient documentation

## 2022-06-11 DIAGNOSIS — Z79899 Other long term (current) drug therapy: Secondary | ICD-10-CM | POA: Insufficient documentation

## 2022-06-11 LAB — CBC WITH DIFFERENTIAL/PLATELET
Abs Immature Granulocytes: 0.01 10*3/uL (ref 0.00–0.07)
Basophils Absolute: 0 10*3/uL (ref 0.0–0.1)
Basophils Relative: 0 %
Eosinophils Absolute: 0.1 10*3/uL (ref 0.0–0.5)
Eosinophils Relative: 1 %
HCT: 45.5 % (ref 39.0–52.0)
Hemoglobin: 15.6 g/dL (ref 13.0–17.0)
Immature Granulocytes: 0 %
Lymphocytes Relative: 28 %
Lymphs Abs: 1.5 10*3/uL (ref 0.7–4.0)
MCH: 29.5 pg (ref 26.0–34.0)
MCHC: 34.3 g/dL (ref 30.0–36.0)
MCV: 86.2 fL (ref 80.0–100.0)
Monocytes Absolute: 0.4 10*3/uL (ref 0.1–1.0)
Monocytes Relative: 8 %
Neutro Abs: 3.4 10*3/uL (ref 1.7–7.7)
Neutrophils Relative %: 63 %
Platelets: 211 10*3/uL (ref 150–400)
RBC: 5.28 MIL/uL (ref 4.22–5.81)
RDW: 14 % (ref 11.5–15.5)
WBC: 5.4 10*3/uL (ref 4.0–10.5)
nRBC: 0 % (ref 0.0–0.2)

## 2022-06-11 LAB — COMPREHENSIVE METABOLIC PANEL
ALT: 11 U/L (ref 0–44)
AST: 16 U/L (ref 15–41)
Albumin: 3.7 g/dL (ref 3.5–5.0)
Alkaline Phosphatase: 68 U/L (ref 38–126)
Anion gap: 11 (ref 5–15)
BUN: 14 mg/dL (ref 6–20)
CO2: 24 mmol/L (ref 22–32)
Calcium: 9.1 mg/dL (ref 8.9–10.3)
Chloride: 94 mmol/L — ABNORMAL LOW (ref 98–111)
Creatinine, Ser: 1.16 mg/dL (ref 0.61–1.24)
GFR, Estimated: >60 mL/min (ref >60)
Glucose, Bld: 681 mg/dL (ref 70–99)
Potassium: 4.6 mmol/L (ref 3.5–5.1)
Sodium: 129 mmol/L — ABNORMAL LOW (ref 135–145)
Total Bilirubin: 1.1 mg/dL (ref 0.3–1.2)
Total Protein: 7.3 g/dL (ref 6.5–8.1)

## 2022-06-11 LAB — RAPID URINE DRUG SCREEN, HOSP PERFORMED
Amphetamines: POSITIVE — AB
Barbiturates: NOT DETECTED
Benzodiazepines: NOT DETECTED
Cocaine: NOT DETECTED
Opiates: NOT DETECTED
Tetrahydrocannabinol: NOT DETECTED

## 2022-06-11 LAB — CBG MONITORING, ED
Glucose-Capillary: >600 mg/dL (ref 70–99)
Glucose-Capillary: 409 mg/dL — ABNORMAL HIGH (ref 70–99)

## 2022-06-11 LAB — URINALYSIS, ROUTINE W REFLEX MICROSCOPIC
Bacteria, UA: NONE SEEN
Bilirubin Urine: NEGATIVE
Glucose, UA: >=500 mg/dL — AB
Hgb urine dipstick: NEGATIVE
Ketones, ur: NEGATIVE mg/dL
Leukocytes,Ua: NEGATIVE
Nitrite: NEGATIVE
Protein, ur: NEGATIVE mg/dL
Specific Gravity, Urine: 1.028 (ref 1.005–1.030)
pH: 5 (ref 5.0–8.0)

## 2022-06-11 MED ORDER — INSULIN ASPART 100 UNIT/ML IJ SOLN
8.0000 [IU] | Freq: Once | INTRAMUSCULAR | Status: AC
  Administered 2022-06-11: 8 [IU] via INTRAVENOUS
  Filled 2022-06-11: qty 0.08

## 2022-06-11 MED ORDER — NOVOLIN 70/30 FLEXPEN (70-30) 100 UNIT/ML ~~LOC~~ SUPN
28.0000 [IU] | PEN_INJECTOR | Freq: Two times a day (BID) | SUBCUTANEOUS | 0 refills | Status: DC
  Filled 2022-06-11: qty 48, 86d supply, fill #0

## 2022-06-11 MED ORDER — SODIUM CHLORIDE 0.9 % IV BOLUS
1000.0000 mL | Freq: Once | INTRAVENOUS | Status: AC
  Administered 2022-06-11: 1000 mL via INTRAVENOUS

## 2022-06-11 MED ORDER — INSULIN REGULAR HUMAN 100 UNIT/ML IJ SOLN
INTRAMUSCULAR | 11 refills | Status: DC
  Filled 2022-06-11 – 2022-06-12 (×2): qty 10, 30d supply, fill #0

## 2022-06-11 NOTE — Discharge Instructions (Addendum)
Return for any problem.   Take insulin as prescribed.

## 2022-06-11 NOTE — TOC Transition Note (Addendum)
Transition of Care [facility]) - CM/SW Discharge Note   Patient Details  Name: [name] [name] MRN: [id number] Date of Birth: [birth year]  [facility] CM/SW Contact:  Lavenia Atlas, RN Phone Number: 06/11/2022, 2:55 PM   Clinical Narrative:   Received TOC consult for medication assistance. Patient has medical insurance coverage and not eligible for [facility] program. Patient reports he does not have money to cover his insurance copay until next week, states" If the hospital doesn't pay for my medication, I will just come back to the hospital".  This RNCM notified [facility] who reports with patient's insurance he will pay $35 copay for Humilin R and $275.67 copay for 86 day supply of insulin isophane & regular human Kwik Pen. Patient then request uber to transport home.   Transportation at discharge: [facility] will provide bus passes.    -3:27p This RNCM at patient's bedside an d patient sleeping, provided bus passes and left with nursing staff.   No additional TOC needs.         Patient Goals and CMS Choice        Discharge Placement  Home with self care                     Discharge Plan and Services    Home with self care, bus pass provided.                                  Social Determinants of Health (SDOH) Interventions     Readmission Risk Interventions     No data to display

## 2022-06-11 NOTE — ED Notes (Signed)
Date and time results received: 06/11/22 1322 (use smartphrase ".now" to insert current time)  Test: Glucose Critical Value: 681  Name of Provider Notified: [facility]

## 2022-06-11 NOTE — ED Provider Notes (Signed)
[facility]-EMERGENCY DEPT Provider Note   CSN: [id number] Arrival date & time: 06/11/22  1151     History  Chief Complaint  Patient presents with   Loss of Consciousness   Hyperglycemia    [name] [name] is a 36 y.o. male.  36 year old male with department history detailed below presents for evaluation.  Patient reports that he felt lightheaded while in the pharmacy earlier today.  He then proceeded to pass out.  Patient denies associated chest pain or shortness of breath.  Patient denies any symptoms at time of evaluation.  He reports that his sugars have been running high because "he has too much stress from too many women and this causes the insulin not to work".  Denies recent fever.  He denies nausea or vomiting.  He does report taking his insulin - however, he insists that it does not work because of his "high stress levels".  The history is provided by medical records and the patient.  Hyperglycemia      Home Medications Prior to Admission medications   Medication Sig Start Date End Date Taking? Authorizing Provider  amLODipine (NORVASC) 10 MG tablet Take 1 tablet (10 mg total) by mouth daily. Patient not taking: Reported on 06/10/2022 06/09/22   Vanetta Mulders, MD  cefadroxil (DURICEF) 500 MG capsule Take 2 capsules (1,000 mg total) by mouth 3 (three) times daily for 28 days. Patient not taking: Reported on 06/10/2022 05/28/22 06/25/22  Rutha Bouchard T, MD  insulin isophane & regular human KwikPen (NOVOLIN 70/30 KWIKPEN) (70-30) 100 UNIT/ML KwikPen Inject 28 Units into the skin 2 (two) times daily. 06/09/22 07/09/22  Vanetta Mulders, MD  insulin regular (HUMULIN R) 100 units/mL injection As per sliding scale Patient not taking: Reported on 06/10/2022 06/09/22   Vanetta Mulders, MD  lisinopril (ZESTRIL) 10 MG tablet Take 1 tablet (10 mg total) by mouth daily. Patient not taking: Reported on 06/10/2022 06/09/22   Vanetta Mulders, MD       Allergies    Patient has no known allergies.    Review of Systems   Review of Systems  All other systems reviewed and are negative.   Physical Exam Updated Vital Signs BP (!) 136/97 (BP Location: Right Arm)   Pulse (!) 106   Temp 98.4 F (36.9 C) (Oral)   Resp 17   SpO2 100%  Physical Exam Vitals and nursing note reviewed.  Constitutional:      General: He is not in acute distress.    Appearance: Normal appearance. He is well-developed.  HENT:     Head: Normocephalic and atraumatic.  Eyes:     Conjunctiva/sclera: Conjunctivae normal.     Pupils: Pupils are equal, round, and reactive to light.  Cardiovascular:     Rate and Rhythm: Regular rhythm. Tachycardia present.     Heart sounds: Normal heart sounds.  Pulmonary:     Effort: Pulmonary effort is normal. No respiratory distress.     Breath sounds: Normal breath sounds.  Abdominal:     General: There is no distension.     Palpations: Abdomen is soft.     Tenderness: There is no abdominal tenderness.  Musculoskeletal:        General: No deformity. Normal range of motion.     Cervical back: Normal range of motion and neck supple.  Skin:    General: Skin is warm and dry.  Neurological:     General: No focal deficit present.     Mental Status: He  is alert and oriented to person, place, and time.     ED Results / Procedures / Treatments   Labs (all labs ordered are listed, but only abnormal results are displayed) Labs Reviewed  CBG MONITORING, ED - Abnormal; Notable for the following components:      Result Value   Glucose-Capillary >600 (*)    All other components within normal limits  CBC WITH DIFFERENTIAL/PLATELET  COMPREHENSIVE METABOLIC PANEL  URINALYSIS, ROUTINE W REFLEX MICROSCOPIC  RAPID URINE DRUG SCREEN, HOSP PERFORMED    EKG EKG Interpretation  Date/Time:  Tuesday June 11 2022 12:28:30 EST Ventricular Rate:  104 PR Interval:  132 QRS Duration: 117 QT Interval:  335 QTC  Calculation: 441 R Axis:   0 Text Interpretation: Sinus tachycardia Incomplete RBBB and LAFB Confirmed by Kristine Royal [phone]) on 06/11/2022 12:40:24 PM  Radiology DG Chest Portable 1 View  Result Date: 06/09/2022 CLINICAL DATA:  Chest pain EXAM: PORTABLE CHEST 1 VIEW COMPARISON:  05/16/2022 FINDINGS: The heart size and mediastinal contours are within normal limits. Both lungs are clear. The visualized skeletal structures are unremarkable. IMPRESSION: No active disease. Electronically Signed   By: Ernie Avena M.D.   On: 06/09/2022 15:15    Procedures Procedures    Medications Ordered in ED Medications  sodium chloride 0.9 % bolus 1,000 mL (has no administration in time range)  insulin aspart (novoLOG) injection 8 Units (has no administration in time range)    ED Course/ Medical Decision Making/ A&P                           Medical Decision Making Amount and/or Complexity of Data Reviewed Labs: ordered. Radiology: ordered.  Risk OTC drugs. Prescription drug management.    Medical Screen Complete  This patient presented to the ED with complaint of syncope, hyperglycemia.  This complaint involves an extensive number of treatment options. The initial differential diagnosis includes, but is not limited to, hyperglycemia, dehydration, metabolic abnormality  This presentation is: Acute, Chronic, Self-Limited, Previously Undiagnosed, Uncertain Prognosis, Complicated, Systemic Symptoms, and Threat to Life/Bodily Function  Patient is presenting after reported syncope or near syncopal event.  Patient admits to recent nonuse of his insulin.  He reports that his insulin is in a friend's house in the refrigerator.  The friend's wife does not like him and will not allow him in the house to get his insulin  Labs are remarkable for elevated glucose.  This is improved after IV insulin and IV fluids.  No other significant metabolic derangement noted.  Patient is comfortable  with plan for discharge.  However, patient is asking for assistance with obtaining more insulin given that his friend's wife will not allow him into their house.  TOC consult initiated for medication assistance.  Patient provided with new prescription for insulin.  Patient is strongly encouraged to take his insulin.  Understands need for close outpatient follow-up.  Additional history obtained:  External records from outside sources obtained and reviewed including prior ED visits and prior Inpatient records.    Lab Tests:  I ordered and personally interpreted labs.  The pertinent results include: CBC, CMP, UA, CBG   Imaging Studies ordered:  I ordered imaging studies including chest x-ray I independently visualized and interpreted obtained imaging which showed NAD I agree with the radiologist interpretation.   Cardiac Monitoring:  The patient was maintained on a cardiac monitor.  I personally viewed and interpreted the cardiac monitor which showed an  underlying rhythm of: NSR   Medicines ordered:  I ordered medication including insulin  for hyperglycemia  Reevaluation of the patient after these medicines showed that the patient: improved   Problem List / ED Course:  Hyperglycemia    Reevaluation:  After the interventions noted above, I reevaluated the patient and found that they have: improved   Disposition:  After consideration of the diagnostic results and the patients response to treatment, I feel that the patent would benefit from close outpatient followup.          Final Clinical Impression(s) / ED Diagnoses Final diagnoses:  Hyperglycemia    Rx / DC Orders ED Discharge Orders          Ordered    insulin isophane & regular human KwikPen (NOVOLIN 70/30 KWIKPEN) (70-30) 100 UNIT/ML KwikPen  2 times daily        06/11/22 1439    insulin regular (HUMULIN R) 100 units/mL injection        06/11/22 1439              Wynetta Fines,  MD 06/11/22 1444

## 2022-06-11 NOTE — ED Notes (Signed)
Pt did not answer for vital update. 

## 2022-06-11 NOTE — ED Notes (Signed)
Attempted to obtain EKG, pt upset he's not in a more comfortable chair/bed. Pt stated he will checkout if he has to go to the lobby. I asked pt to let me know when he is ready to have an EKG done.

## 2022-06-11 NOTE — ED Notes (Signed)
Pt called for vitals recheck, no answer.  

## 2022-06-11 NOTE — ED Notes (Signed)
Pt in bed with eyes closed, pt arouses easily to verbal stim, upon arousal pt reports 8/10 back pain. Pt verbalized understanding d/c instructions and follow up, reviewed medications. Two buss tickets given, pt ambulatory from dpt

## 2022-06-11 NOTE — ED Notes (Signed)
Pt in bed with eyes closed, resps even and unlabored.  

## 2022-06-11 NOTE — ED Triage Notes (Signed)
Pt arrived via EMS, syncopal episode at pharmacy. Was seen at [facility] last night, left before completing care. Pt c/o dizziness and left flank pain.

## 2022-06-11 NOTE — ED Provider Triage Note (Signed)
Emergency Medicine Provider Triage Evaluation Note  [name] [name] , a 36 y.o. male  was evaluated in triage.  Pt complains  syncopal episode.  Pt is difficult to arouse.  Pt falls back asleep.    Review of Systems  Positive: unable Negative:   Physical Exam  BP 130/88   Pulse (!) 122   Temp 98.4 F (36.9 C) (Oral)   Resp 18   SpO2 100%  Gen:   Sleepy  Resp:  Normal effort  MSK:   Moves extremities without difficulty  Other:    Medical Decision Making  Medically screening exam initiated at 12:22 PM.  Appropriate orders placed.  [name] [name] was informed that the remainder of the evaluation will be completed by another provider, this initial triage assessment does not replace that evaluation, and the importance of remaining in the ED until their evaluation is complete.     [name] [name], [place] 06/11/22 1223

## 2022-06-12 ENCOUNTER — Other Ambulatory Visit (HOSPITAL_COMMUNITY): Payer: Self-pay

## 2022-06-12 ENCOUNTER — Emergency Department (HOSPITAL_COMMUNITY)
Admission: EM | Admit: 2022-06-12 | Discharge: 2022-06-13 | Disposition: A | Discharge status: Home / Self Care | Payer: No Typology Code available for payment source | Attending: Emergency Medicine | Admitting: Emergency Medicine

## 2022-06-12 DIAGNOSIS — E11649 Type 2 diabetes mellitus with hypoglycemia without coma: Secondary | ICD-10-CM | POA: Diagnosis present

## 2022-06-12 DIAGNOSIS — E162 Hypoglycemia, unspecified: Secondary | ICD-10-CM

## 2022-06-12 DIAGNOSIS — Z794 Long term (current) use of insulin: Secondary | ICD-10-CM | POA: Diagnosis not present

## 2022-06-12 NOTE — ED Triage Notes (Signed)
Pt comes via [facility] for hypoglycemia, pt states that he last took his insulin at 8am, first CBG with EMS was 77, given oral glucose, last check was 89

## 2022-06-13 ENCOUNTER — Emergency Department (HOSPITAL_COMMUNITY)
Admission: EM | Admit: 2022-06-13 | Discharge: 2022-06-13 | Disposition: A | Discharge status: Home / Self Care | Payer: No Typology Code available for payment source | Source: Home / Self Care

## 2022-06-13 ENCOUNTER — Encounter (HOSPITAL_COMMUNITY): Payer: Self-pay

## 2022-06-13 ENCOUNTER — Other Ambulatory Visit: Payer: Self-pay

## 2022-06-13 DIAGNOSIS — Z794 Long term (current) use of insulin: Secondary | ICD-10-CM | POA: Insufficient documentation

## 2022-06-13 DIAGNOSIS — E162 Hypoglycemia, unspecified: Secondary | ICD-10-CM

## 2022-06-13 DIAGNOSIS — E11649 Type 2 diabetes mellitus with hypoglycemia without coma: Secondary | ICD-10-CM | POA: Insufficient documentation

## 2022-06-13 LAB — LIPASE, BLOOD: Lipase: 73 U/L — ABNORMAL HIGH (ref 11–51)

## 2022-06-13 LAB — COMPREHENSIVE METABOLIC PANEL
ALT: 13 U/L (ref 0–44)
AST: 23 U/L (ref 15–41)
Albumin: 4.4 g/dL (ref 3.5–5.0)
Alkaline Phosphatase: 68 U/L (ref 38–126)
Anion gap: 12 (ref 5–15)
BUN: 11 mg/dL (ref 6–20)
CO2: 23 mmol/L (ref 22–32)
Calcium: 10.6 mg/dL — ABNORMAL HIGH (ref 8.9–10.3)
Chloride: 104 mmol/L (ref 98–111)
Creatinine, Ser: 0.97 mg/dL (ref 0.61–1.24)
GFR, Estimated: >60 mL/min (ref >60)
Glucose, Bld: 51 mg/dL — ABNORMAL LOW (ref 70–99)
Potassium: 3.1 mmol/L — ABNORMAL LOW (ref 3.5–5.1)
Sodium: 139 mmol/L (ref 135–145)
Total Bilirubin: 0.3 mg/dL (ref 0.3–1.2)
Total Protein: 8.3 g/dL — ABNORMAL HIGH (ref 6.5–8.1)

## 2022-06-13 LAB — CBC WITH DIFFERENTIAL/PLATELET
Abs Immature Granulocytes: 0.02 10*3/uL (ref 0.00–0.07)
Basophils Absolute: 0 10*3/uL (ref 0.0–0.1)
Basophils Relative: 1 %
Eosinophils Absolute: 0 10*3/uL (ref 0.0–0.5)
Eosinophils Relative: 1 %
HCT: 48 % (ref 39.0–52.0)
Hemoglobin: 17 g/dL (ref 13.0–17.0)
Immature Granulocytes: 0 %
Lymphocytes Relative: 36 %
Lymphs Abs: 2.9 10*3/uL (ref 0.7–4.0)
MCH: 29.6 pg (ref 26.0–34.0)
MCHC: 35.4 g/dL (ref 30.0–36.0)
MCV: 83.5 fL (ref 80.0–100.0)
Monocytes Absolute: 0.6 10*3/uL (ref 0.1–1.0)
Monocytes Relative: 7 %
Neutro Abs: 4.5 10*3/uL (ref 1.7–7.7)
Neutrophils Relative %: 55 %
Platelets: 239 10*3/uL (ref 150–400)
RBC: 5.75 MIL/uL (ref 4.22–5.81)
RDW: 14.1 % (ref 11.5–15.5)
WBC: 8 10*3/uL (ref 4.0–10.5)
nRBC: 0 % (ref 0.0–0.2)

## 2022-06-13 LAB — CBG MONITORING, ED
Glucose-Capillary: 104 mg/dL — ABNORMAL HIGH (ref 70–99)
Glucose-Capillary: 124 mg/dL — ABNORMAL HIGH (ref 70–99)
Glucose-Capillary: 156 mg/dL — ABNORMAL HIGH (ref 70–99)
Glucose-Capillary: 207 mg/dL — ABNORMAL HIGH (ref 70–99)
Glucose-Capillary: 71 mg/dL (ref 70–99)
Glucose-Capillary: 85 mg/dL (ref 70–99)

## 2022-06-13 MED ORDER — DEXTROSE-NACL 5-0.9 % IV SOLN: Freq: Once | INTRAVENOUS | Status: DC

## 2022-06-13 NOTE — ED Provider Triage Note (Signed)
Emergency Medicine Provider Triage Evaluation Note  [name] [name] , a 36 y.o. male  was evaluated in triage.  Pt complains of feeling like he has low blood sugar, states that he cannot remember when it felt low, states that he did not check his blood sugar to confirm that it was truly low, he is understanding complaints at this time.    I have reviewed patient's chart he has been seen on the 12th, 13th, 14th and yesterday, initially was seen for hyperglycemia, apparently patient not taking his diabetes medication, he has been restarted on all of his diabetes medications, he was seen earlier today, apparently his blood sugar was in the 70s, not been observed was a long blood sugar remained stable 100s.  He was discharged at 1150 and then presented to [facility] for feeling like he has low blood sugar.  Review of Systems  Positive: Low blood sugar Negative: Fevers, chills  Physical Exam  BP (!) 164/112 (BP Location: Right Arm)   Pulse 96   Temp 98.2 F (36.8 C)   Resp 18   SpO2 100%  Gen:   Awake, no distress   Resp:  Normal effort  MSK:   Moves extremities without difficulty  Other:    Medical Decision Making  Medically screening exam initiated at 4:47 AM.  Appropriate orders placed.  [name] [name] was informed that the remainder of the evaluation will be completed by another provider, this initial triage assessment does not replace that evaluation, and the importance of remaining in the ED until their evaluation is complete.  Patient was given applesauce on arrival, initial glucose was 124, 2 hours later repeat was obtained now has a glucose of 71, will add on basic lab work-up fine with a sandwich, patient will need further observation.   Carroll Sage, PA-C 06/13/22 [phone]

## 2022-06-13 NOTE — ED Notes (Signed)
Two sandwich bags and three sodas were provided to pt.

## 2022-06-13 NOTE — Discharge Instructions (Signed)
It is important that after you take your insulin you eat substantial food this includes complex carbohydrates like whole wheat, oatmeal, whole grains, make sure that you monitor your glucose regularly.  Please follow-up with your primary care provider, you may follow-up with community health and wellness  Come back to the emergency department if you develop chest pain, shortness of breath, severe abdominal pain, uncontrolled nausea, vomiting, diarrhea.

## 2022-06-13 NOTE — ED Notes (Signed)
Pt given apple juice and ham sandwich 

## 2022-06-13 NOTE — ED Notes (Signed)
Pt states, "I'm not ready to get stuck. I need to calm down." States that he will allow this RN to return in 20 mins.

## 2022-06-13 NOTE — ED Triage Notes (Signed)
Pt via POV after leaving [facility] hospital. Complains of concerns regarding low CBG. Denies NV. Hx T1DM

## 2022-06-13 NOTE — ED Notes (Addendum)
PT cursing at this RN because his room is cold. Pt room is set on the same temperature as other rooms. I explained that we do not have thermostats in each room. He states that we are not treating him like he is one of our loved ones.  I attempted to begin to place an IV. Patient told me that I have to wipe off the ultrasound jelly prior to sticking him. I explained that cannot see his veins if I wipe off the [place] jelly. He states that if it burns, he will sue me.   Due to patient's unwillingness to participate in treatment plan, he was not stuck for an IV. MD made aware.

## 2022-06-13 NOTE — ED Provider Notes (Signed)
[name] DEPT Provider Note   CSN: [id number] Arrival date & time: 06/12/22  2353     History  Chief Complaint  Patient presents with   Hypoglycemia    [name] [name] is a 36 y.o. male.  The history is provided by the patient.  Hypoglycemia Initial blood sugar:  77 Blood sugar after intervention:  104 Severity:  Moderate Timing:  Constant Progression:  Improving Chronicity:  Recurrent Diabetic status:  Controlled with insulin Context: not decreased oral intake   Context comment:  Restarted on medication yesterday Relieved by:  Nothing Ineffective treatments:  Eating Associated symptoms: no seizures, no speech difficulty, no visual change, no vomiting and no weakness   Risk factors: no cancer   Patient with DM and hypoglycemia was seen yesterday for hyperglycemia and restarted on insulin and then EMS was called for low sugars.       Home Medications Prior to Admission medications   Medication Sig Start Date End Date Taking? Authorizing Provider  insulin isophane & regular human KwikPen (NOVOLIN 70/30 KWIKPEN) (70-30) 100 UNIT/ML KwikPen Inject 28 Units into the skin 2 (two) times daily. 06/11/22  Yes Valarie Merino, MD  insulin regular (HUMULIN R) 100 units/mL injection As per sliding scale **discard 31 days after opening** 06/11/22  Yes Messick, Wallis Bamberg, MD  amLODipine (NORVASC) 10 MG tablet Take 1 tablet (10 mg total) by mouth daily. Patient not taking: Reported on 06/10/2022 06/09/22   Fredia Sorrow, MD  cefadroxil (DURICEF) 500 MG capsule Take 2 capsules (1,000 mg total) by mouth 3 (three) times daily for 28 days. Patient not taking: Reported on 06/10/2022 05/28/22 06/25/22  Prudencio Pair T, MD  lisinopril (ZESTRIL) 10 MG tablet Take 1 tablet (10 mg total) by mouth daily. Patient not taking: Reported on 06/10/2022 06/09/22   Fredia Sorrow, MD      Allergies    Patient has no known allergies.    Review of Systems   Review of  Systems  Constitutional:  Negative for fever.  HENT:  Negative for facial swelling.   Eyes:  Negative for redness.  Respiratory:  Negative for stridor.   Cardiovascular: Negative.   Gastrointestinal:  Negative for vomiting.  Neurological:  Negative for seizures, speech difficulty and weakness.  All other systems reviewed and are negative.   Physical Exam Updated Vital Signs BP (!) 172/113   Pulse 83   Temp (!) 97.4 F (36.3 C) (Oral)   Resp 18   SpO2 98%  Physical Exam Vitals and nursing note reviewed.  Constitutional:      General: He is not in acute distress.    Appearance: He is well-developed. He is not diaphoretic.  HENT:     Head: Normocephalic and atraumatic.  Eyes:     Conjunctiva/sclera: Conjunctivae normal.     Pupils: Pupils are equal, round, and reactive to light.  Cardiovascular:     Rate and Rhythm: Normal rate and regular rhythm.  Pulmonary:     Effort: Pulmonary effort is normal.     Breath sounds: Normal breath sounds. No wheezing or rales.  Abdominal:     General: Bowel sounds are normal.     Palpations: Abdomen is soft.     Tenderness: There is no abdominal tenderness. There is no guarding or rebound.  Musculoskeletal:        General: Normal range of motion.     Cervical back: Normal range of motion and neck supple.  Skin:    General: Skin  is warm and dry.     Capillary Refill: Capillary refill takes less than 2 seconds.  Neurological:     General: No focal deficit present.     Mental Status: He is alert and oriented to person, place, and time.  Psychiatric:        Mood and Affect: Mood normal.        Behavior: Behavior normal.     ED Results / Procedures / Treatments   Labs (all labs ordered are listed, but only abnormal results are displayed) Labs Reviewed  CBG MONITORING, ED - Abnormal; Notable for the following components:      Result Value   Glucose-Capillary 104 (*)    All other components within normal limits  CBG MONITORING, ED -  Abnormal; Notable for the following components:   Glucose-Capillary 156 (*)    All other components within normal limits  CBC WITH DIFFERENTIAL/PLATELET  CBG MONITORING, ED  I-STAT CHEM 8, ED    EKG None  Radiology DG Chest Port 1 View  Result Date: 06/11/2022 CLINICAL DATA:  Syncope. EXAM: PORTABLE CHEST 1 VIEW COMPARISON:  Chest x-ray June 09, 2022. FINDINGS: Low lung volumes. No consolidation. No visible pleural effusions or pneumothorax. Cardiomediastinal silhouette is within normal limits for technique. IMPRESSION: No active disease. Electronically Signed   By: Feliberto Harts M.D.   On: 06/11/2022 13:06    Procedures Procedures    Medications Ordered in ED Medications  dextrose 5 %-0.9 % sodium chloride infusion (has no administration in time range)    ED Course/ Medical Decision Making/ A&P                           Medical Decision Making Patient was hypoglycemic all day and then was 77 for EMS who transported patient.  Patient is taking POs  Amount and/or Complexity of Data Reviewed Independent Historian: EMS    Details: See above  External Data Reviewed: notes.    Details: Previous ED notes and labs reviewed  Labs: ordered.    Details: Labs ordered by me but patient refused IV and further attempts to draw labs by staff  Risk Prescription drug management. Risk Details: Patient is taking POs in the ED and has been observed and is AO4, he is refusing labs and IV and is stable for discharge.      Final Clinical Impression(s) / ED Diagnoses Final diagnoses:  Hypoglycemia   Return for intractable cough, coughing up blood, fevers > 100.4 unrelieved by medication, shortness of breath, intractable vomiting, chest pain, shortness of breath, weakness, numbness, changes in speech, facial asymmetry, abdominal pain, passing out, Inability to tolerate liquids or food, cough, altered mental status or any concerns. No signs of systemic illness or infection. The  patient is nontoxic-appearing on exam and vital signs are within normal limits.  I have reviewed the triage vital signs and the nursing notes. Pertinent labs & imaging results that were available during my care of the patient were reviewed by me and considered in my medical decision making (see chart for details). After history, exam, and medical workup I feel the patient has been appropriately medically screened and is safe for discharge home. Pertinent diagnoses were discussed with the patient. Patient was given return precautions.  Rx / DC Orders ED Discharge Orders     None         Leshon Armistead, MD 06/13/22 [phone]

## 2022-06-13 NOTE — ED Provider Notes (Signed)
[facility] EMERGENCY DEPARTMENT Provider Note   CSN: [id number] Arrival date & time: 06/13/22  0230     History  Chief Complaint  Patient presents with   Hypoglycemia    [name] [name] is a 36 y.o. male.  HPI   Pt complains of feeling like he has low blood sugar, states that he cannot remember when it felt low, states that he did not check his blood sugar to confirm that it was truly low, he is understanding complaints at this time.    I have reviewed patient's chart he has been seen on the 12th, 13th, 14th and yesterday, initially was seen for hyperglycemia, apparently patient not taking his diabetes medication, he has been restarted on all of his diabetes medications, he was seen earlier today, apparently his blood sugar was in the 70s, not been observed was a long blood sugar remained stable 100s.  He was discharged at 1150 and then presented to [facility] for feeling like he has low blood sugar.  Home Medications Prior to Admission medications   Medication Sig Start Date End Date Taking? Authorizing Provider  amLODipine (NORVASC) 10 MG tablet Take 1 tablet (10 mg total) by mouth daily. Patient not taking: Reported on 06/10/2022 06/09/22   Vanetta Mulders, MD  cefadroxil (DURICEF) 500 MG capsule Take 2 capsules (1,000 mg total) by mouth 3 (three) times daily for 28 days. Patient not taking: Reported on 06/10/2022 05/28/22 06/25/22  Rutha Bouchard T, MD  insulin isophane & regular human KwikPen (NOVOLIN 70/30 KWIKPEN) (70-30) 100 UNIT/ML KwikPen Inject 28 Units into the skin 2 (two) times daily. 06/11/22   Wynetta Fines, MD  insulin regular (HUMULIN R) 100 units/mL injection As per sliding scale **discard 31 days after opening** 06/11/22   Wynetta Fines, MD  lisinopril (ZESTRIL) 10 MG tablet Take 1 tablet (10 mg total) by mouth daily. Patient not taking: Reported on 06/10/2022 06/09/22   Vanetta Mulders, MD      Allergies    Patient has no known  allergies.    Review of Systems   Review of Systems  Constitutional:  Negative for chills and fever.  Respiratory:  Negative for shortness of breath.   Cardiovascular:  Negative for chest pain.  Gastrointestinal:  Negative for abdominal pain.  Neurological:  Negative for headaches.    Physical Exam Updated Vital Signs BP 138/86 (BP Location: Right Arm)   Pulse 90   Temp 97.8 F (36.6 C)   Resp 18   SpO2 100%  Physical Exam Vitals and nursing note reviewed.  Constitutional:      General: He is not in acute distress.    Appearance: Normal appearance. He is not ill-appearing or diaphoretic.  HENT:     Head: Normocephalic and atraumatic.     Nose: No congestion.  Eyes:     Conjunctiva/sclera: Conjunctivae normal.  Pulmonary:     Effort: Pulmonary effort is normal.  Musculoskeletal:     Cervical back: Neck supple.  Skin:    General: Skin is warm and dry.  Neurological:     Mental Status: He is alert.  Psychiatric:        Mood and Affect: Mood normal.     ED Results / Procedures / Treatments   Labs (all labs ordered are listed, but only abnormal results are displayed) Labs Reviewed  COMPREHENSIVE METABOLIC PANEL - Abnormal; Notable for the following components:      Result Value   Potassium 3.1 (*)  Glucose, Bld 51 (*)    Calcium 10.6 (*)    Total Protein 8.3 (*)    All other components within normal limits  LIPASE, BLOOD - Abnormal; Notable for the following components:   Lipase 73 (*)    All other components within normal limits  CBG MONITORING, ED - Abnormal; Notable for the following components:   Glucose-Capillary 124 (*)    All other components within normal limits  CBG MONITORING, ED - Abnormal; Notable for the following components:   Glucose-Capillary 207 (*)    All other components within normal limits  CBC WITH DIFFERENTIAL/PLATELET  CBG MONITORING, ED    EKG None  Radiology DG Chest Port 1 View  Result Date: 06/11/2022 CLINICAL DATA:   Syncope. EXAM: PORTABLE CHEST 1 VIEW COMPARISON:  Chest x-ray June 09, 2022. FINDINGS: Low lung volumes. No consolidation. No visible pleural effusions or pneumothorax. Cardiomediastinal silhouette is within normal limits for technique. IMPRESSION: No active disease. Electronically Signed   By: Feliberto Harts M.D.   On: 06/11/2022 13:06    Procedures Procedures    Medications Ordered in ED Medications - No data to display  ED Course/ Medical Decision Making/ A&P                           Medical Decision Making Amount and/or Complexity of Data Reviewed Labs: ordered.   This patient presents to the ED for concern of hypoglycemia, this involves an extensive number of treatment options, and is a complaint that carries with it a high risk of complications and morbidity.  The differential diagnosis includes diabetic emergency, infection,    Additional history obtained:  Additional history obtained from N/A External records from outside source obtained and reviewed including recent ER notes   Co morbidities that complicate the patient evaluation  Diabetes  Social Determinants of Health:  Homelessness    Lab Tests:  I Ordered, and personally interpreted labs.  The pertinent results include: CBC unremarkable, lipase 73, CMP shows potassium 3.1, glucose 1, calcium 10.6,   Imaging Studies ordered:  I ordered imaging studies including N/A I independently visualized and interpreted imaging which showed N/A I agree with the radiologist interpretation   Cardiac Monitoring:  The patient was maintained on a cardiac monitor.  I personally viewed and interpreted the cardiac monitored which showed an underlying rhythm of: N/A   Medicines ordered and prescription drug management:  I ordered medication including N/A I have reviewed the patients home medicines and have made adjustments as needed  Critical Interventions:  N/A   Reevaluation:  Presents with concerns  of hypoglycemia, on arrival blood sugar was 124, states he last took NovoLog earlier yesterday morning, this is not a long lasting diabetic medication, he was given applesauce on arrival will reassess  Patient's glucose was reassessed, it is 11, he was refusing Mordecai Maes at this time, I explained to him that the reason why his blood sugar is dropping is because he has had nothing substantial, will provide him with a sandwich as well as soda, obtain basic lab work-up.  After patient was provided with food as well as a soda, repeat CBG is 207, he has no complaints, he is agreement discharge.    Consultations Obtained:  N/A     Test Considered:  N/A    Rule out Doubt systemic infection patient nontoxic-appearing vital signs are reassuring no leukocytosis present on CBC.  Low suspicion for DKA, HSS glucose is not markedly  elevated, there is no anion gap.  I have low suspicion for electrode derailments as lab work is all unremarkable.  Patient did have a low glucose on initial CBG as well as on the CMP but again I suspect this is from not eating a substantial meal as he states he still has been drinking juice.      Dispostion and problem list  After consideration of the diagnostic results and the patients response to treatment, I feel that the patent would benefit from discharge.  Hypoglycemia-educated the patient on ensuring that he eats complex carbohydrates especially after taking his insulin, and will have him follow-up with his PCP and given strict return precautions.            Final Clinical Impression(s) / ED Diagnoses Final diagnoses:  Hypoglycemia    Rx / DC Orders ED Discharge Orders     None         Carroll Sage, PA-C 06/13/22 0631    Nira Conn, MD 06/13/22 [phone]

## 2022-06-13 NOTE — ED Notes (Signed)
When attempted to Start IV pt start to complain of being cold. RN stated I will try to fix it after starting an IV and offered him warm blankets. Patient start yelling and cursing the RN.

## 2022-06-13 NOTE — ED Notes (Signed)
Pt A&Ox4. Tolerating PO well.

## 2022-06-19 ENCOUNTER — Other Ambulatory Visit (HOSPITAL_COMMUNITY): Payer: Self-pay

## 2022-06-27 ENCOUNTER — Emergency Department (HOSPITAL_COMMUNITY): Payer: No Typology Code available for payment source

## 2022-06-27 ENCOUNTER — Emergency Department (HOSPITAL_COMMUNITY)
Admission: EM | Admit: 2022-06-27 | Discharge: 2022-06-27 | Disposition: A | Discharge status: Home / Self Care | Payer: No Typology Code available for payment source | Attending: Emergency Medicine | Admitting: Emergency Medicine

## 2022-06-27 DIAGNOSIS — R079 Chest pain, unspecified: Secondary | ICD-10-CM | POA: Insufficient documentation

## 2022-06-27 DIAGNOSIS — Z794 Long term (current) use of insulin: Secondary | ICD-10-CM | POA: Diagnosis not present

## 2022-06-27 DIAGNOSIS — R739 Hyperglycemia, unspecified: Secondary | ICD-10-CM | POA: Diagnosis present

## 2022-06-27 DIAGNOSIS — E1165 Type 2 diabetes mellitus with hyperglycemia: Secondary | ICD-10-CM | POA: Diagnosis not present

## 2022-06-27 LAB — CBC WITH DIFFERENTIAL/PLATELET
Abs Immature Granulocytes: 0 10*3/uL (ref 0.00–0.07)
Basophils Absolute: 0 10*3/uL (ref 0.0–0.1)
Basophils Relative: 1 %
Eosinophils Absolute: 0 10*3/uL (ref 0.0–0.5)
Eosinophils Relative: 1 %
HCT: 42.3 % (ref 39.0–52.0)
Hemoglobin: 14.5 g/dL (ref 13.0–17.0)
Immature Granulocytes: 0 %
Lymphocytes Relative: 28 %
Lymphs Abs: 1.3 10*3/uL (ref 0.7–4.0)
MCH: 30 pg (ref 26.0–34.0)
MCHC: 34.3 g/dL (ref 30.0–36.0)
MCV: 87.4 fL (ref 80.0–100.0)
Monocytes Absolute: 0.6 10*3/uL (ref 0.1–1.0)
Monocytes Relative: 13 %
Neutro Abs: 2.6 10*3/uL (ref 1.7–7.7)
Neutrophils Relative %: 57 %
Platelets: 208 10*3/uL (ref 150–400)
RBC: 4.84 MIL/uL (ref 4.22–5.81)
RDW: 13.2 % (ref 11.5–15.5)
WBC: 4.6 10*3/uL (ref 4.0–10.5)
nRBC: 0 % (ref 0.0–0.2)

## 2022-06-27 LAB — CBG MONITORING, ED
Glucose-Capillary: 254 mg/dL — ABNORMAL HIGH (ref 70–99)
Glucose-Capillary: 439 mg/dL — ABNORMAL HIGH (ref 70–99)
Glucose-Capillary: 499 mg/dL — ABNORMAL HIGH (ref 70–99)

## 2022-06-27 LAB — COMPREHENSIVE METABOLIC PANEL
ALT: 12 U/L (ref 0–44)
AST: 18 U/L (ref 15–41)
Albumin: 3.6 g/dL (ref 3.5–5.0)
Alkaline Phosphatase: 59 U/L (ref 38–126)
Anion gap: 11 (ref 5–15)
BUN: 10 mg/dL (ref 6–20)
CO2: 21 mmol/L — ABNORMAL LOW (ref 22–32)
Calcium: 9.2 mg/dL (ref 8.9–10.3)
Chloride: 100 mmol/L (ref 98–111)
Creatinine, Ser: 1.16 mg/dL (ref 0.61–1.24)
GFR, Estimated: >60 mL/min (ref >60)
Glucose, Bld: 552 mg/dL (ref 70–99)
Potassium: 4.2 mmol/L (ref 3.5–5.1)
Sodium: 132 mmol/L — ABNORMAL LOW (ref 135–145)
Total Bilirubin: 0.5 mg/dL (ref 0.3–1.2)
Total Protein: 6.5 g/dL (ref 6.5–8.1)

## 2022-06-27 LAB — TROPONIN I (HIGH SENSITIVITY)
Troponin I (High Sensitivity): 6 ng/L (ref <18)
Troponin I (High Sensitivity): 7 ng/L (ref <18)

## 2022-06-27 LAB — RAPID URINE DRUG SCREEN, HOSP PERFORMED
Amphetamines: POSITIVE — AB
Barbiturates: NOT DETECTED
Benzodiazepines: NOT DETECTED
Cocaine: NOT DETECTED
Opiates: NOT DETECTED
Tetrahydrocannabinol: NOT DETECTED

## 2022-06-27 LAB — I-STAT VENOUS BLOOD GAS, ED
Acid-Base Excess: 2 mmol/L (ref 0.0–2.0)
Bicarbonate: 26.2 mmol/L (ref 20.0–28.0)
Calcium, Ion: 1.13 mmol/L — ABNORMAL LOW (ref 1.15–1.40)
HCT: 41 % (ref 39.0–52.0)
Hemoglobin: 13.9 g/dL (ref 13.0–17.0)
O2 Saturation: 100 %
Potassium: 5 mmol/L (ref 3.5–5.1)
Sodium: 132 mmol/L — ABNORMAL LOW (ref 135–145)
TCO2: 27 mmol/L (ref 22–32)
pCO2, Ven: 38.1 mmHg — ABNORMAL LOW (ref 44–60)
pH, Ven: 7.446 — ABNORMAL HIGH (ref 7.25–7.43)
pO2, Ven: 174 mmHg — ABNORMAL HIGH (ref 32–45)

## 2022-06-27 LAB — SALICYLATE LEVEL: Salicylate Lvl: <7 mg/dL — ABNORMAL LOW (ref 7.0–30.0)

## 2022-06-27 LAB — ACETAMINOPHEN LEVEL: Acetaminophen (Tylenol), Serum: <10 ug/mL — ABNORMAL LOW (ref 10–30)

## 2022-06-27 LAB — ETHANOL: Alcohol, Ethyl (B): <10 mg/dL (ref <10)

## 2022-06-27 MED ORDER — LACTATED RINGERS IV BOLUS
1000.0000 mL | Freq: Once | INTRAVENOUS | Status: AC
  Administered 2022-06-27: 1000 mL via INTRAVENOUS

## 2022-06-27 MED ORDER — INSULIN ASPART 100 UNIT/ML IJ SOLN
10.0000 [IU] | Freq: Once | INTRAMUSCULAR | Status: AC
  Administered 2022-06-27: 10 [IU] via INTRAVENOUS

## 2022-06-27 NOTE — ED Provider Notes (Signed)
[facility]-EMERGENCY DEPT [facility] Emergency Department Provider Note MRN:  [id number]  Arrival date & time: 06/27/22     Chief Complaint   Chest Pain and Hyperglycemia   History of Present Illness   [name] [name] is a 36 y.o. year-old male presents to the ED with chief complaint of hyperglycemia and chest pain.  States that his blood sugar has been running high for the past couple of weeks.  States that he has been taking his home meds as directed, but the CBG remains high.  He states that he has also had a cough and reports that he is concerned about pneumonia.  States that he has had some associated chest pains as well.  History provided by patient.   Review of Systems  Pertinent positive and negative review of systems noted in HPI.    Physical Exam   Vitals:   06/27/22 0315 06/27/22 0330  BP: (!) 133/100 (!) 128/91  Pulse: (!) 102 99  Resp: (!) 22 (!) 21  Temp:    SpO2: 97% 96%    CONSTITUTIONAL:  intoxicated-appearing, NAD NEURO:  Alert and oriented x 3, CN 3-12 grossly intact EYES:  eyes equal and reactive ENT/NECK:  Supple, no stridor  CARDIO:  mild tachycardia, regular rhythm, appears well-perfused  PULM:  No respiratory distress,  GI/GU:  non-distended, non tenderness MSK/SPINE:  No gross deformities, no edema, moves all extremities  SKIN:  no rash, atraumatic   *Additional and/or pertinent findings included in MDM below  Diagnostic and Interventional Summary    EKG Interpretation  Date/Time:  Thursday June 27 2022 00:35:23 EST Ventricular Rate:  97 PR Interval:  132 QRS Duration: 112 QT Interval:  377 QTC Calculation: 479 R Axis:   -23 Text Interpretation: Sinus rhythm Incomplete RBBB and LAFB Borderline prolonged QT interval Confirmed by Tilden Fossa [phone]) on 06/27/2022 12:41:31 AM       Labs Reviewed  COMPREHENSIVE METABOLIC PANEL - Abnormal; Notable for the following components:      Result Value   Sodium 132 (*)    CO2 21 (*)     Glucose, Bld 552 (*)    All other components within normal limits  SALICYLATE LEVEL - Abnormal; Notable for the following components:   Salicylate Lvl <7.0 (*)    All other components within normal limits  ACETAMINOPHEN LEVEL - Abnormal; Notable for the following components:   Acetaminophen (Tylenol), Serum <10 (*)    All other components within normal limits  RAPID URINE DRUG SCREEN, HOSP PERFORMED - Abnormal; Notable for the following components:   Amphetamines POSITIVE (*)    All other components within normal limits  CBG MONITORING, ED - Abnormal; Notable for the following components:   Glucose-Capillary 499 (*)    All other components within normal limits  I-STAT VENOUS BLOOD GAS, ED - Abnormal; Notable for the following components:   pH, Ven 7.446 (*)    pCO2, Ven 38.1 (*)    pO2, Ven 174 (*)    Sodium 132 (*)    Calcium, Ion 1.13 (*)    All other components within normal limits  CBG MONITORING, ED - Abnormal; Notable for the following components:   Glucose-Capillary 439 (*)    All other components within normal limits  CBG MONITORING, ED - Abnormal; Notable for the following components:   Glucose-Capillary 254 (*)    All other components within normal limits  ETHANOL  CBC WITH DIFFERENTIAL/PLATELET  CBG MONITORING, ED  TROPONIN I (HIGH SENSITIVITY)  TROPONIN  I (HIGH SENSITIVITY)    DG Chest 2 View  Final Result      Medications  lactated ringers bolus 1,000 mL (0 mLs Intravenous Stopped 06/27/22 0151)  lactated ringers bolus 1,000 mL (0 mLs Intravenous Stopped 06/27/22 0232)  insulin aspart (novoLOG) injection 10 Units (10 Units Intravenous Given 06/27/22 0154)     Procedures  /  Critical Care Procedures  ED Course and Medical Decision Making  I have reviewed the triage vital signs, the nursing notes, and pertinent available records from the EMR.  Social Determinants Affecting Complexity of Care: Patient has no clinically significant social determinants  affecting this chief complaint..   ED Course:    Medical Decision Making Patient here with hyperglycemia.  States he has been using his home meds as directed.   Also reports cough and associated chest pain.    Trops are flat.  Doubt ACS.  Not hypoxic, doubt PE.  CXR negative, doubt pneumonia.  Glucose is trending down after fluids and insulin.  UDS +amphetamines.    I think that given fairly reassuring workup, patient can be discharged without further emergent workup.  Will give resources for outpatient follow-up.  Amount and/or Complexity of Data Reviewed Labs: ordered.    Details: Trops 6->7, doubt ACS No leukocytosis to suggest infection CBG 499->439->254, down trending appropriately. Normal anion gap, venous pH is 7.44, doubt DKA. +amphetamines on UDS Radiology: ordered and independent interpretation performed.    Details: No pneumonia  Risk Prescription drug management. Decision regarding hospitalization.     Consultants: No consultations were needed in caring for this patient.   Treatment and Plan: I considered admission due to patient's initial presentation, but after considering the examination and diagnostic results, patient will not require admission and can be discharged with outpatient follow-up.    Final Clinical Impressions(s) / ED Diagnoses     ICD-10-CM   1. Hyperglycemia  R73.9     2. Chest pain, unspecified type  R07.9       ED Discharge Orders     None         Discharge Instructions Discussed with and Provided to Patient:   Discharge Instructions   None      Roxy Horseman, PA-C 06/27/22 0343    Tilden Fossa, MD 06/27/22 0630

## 2022-06-27 NOTE — ED Triage Notes (Signed)
Pt to ED via EMS from hotel. Pt c/o CP that started today. Pt has hx of DM. Pt also c/o cough. Pt's CBG 508 with EMS. Pt c/o hyperglycemia for a few weeks.   EMS Vitals: 102 HR 170/108 16 RR 100% RA

## 2022-08-12 ENCOUNTER — Observation Stay (HOSPITAL_COMMUNITY)
Admission: EM | Admit: 2022-08-12 | Discharge: 2022-08-14 | Disposition: A | Discharge status: Home / Self Care | Payer: No Typology Code available for payment source | Attending: Infectious Diseases | Admitting: Infectious Diseases

## 2022-08-12 ENCOUNTER — Other Ambulatory Visit: Payer: Self-pay

## 2022-08-12 ENCOUNTER — Encounter (HOSPITAL_COMMUNITY): Payer: Self-pay

## 2022-08-12 DIAGNOSIS — R9431 Abnormal electrocardiogram [ECG] [EKG]: Secondary | ICD-10-CM | POA: Insufficient documentation

## 2022-08-12 DIAGNOSIS — E1065 Type 1 diabetes mellitus with hyperglycemia: Secondary | ICD-10-CM | POA: Insufficient documentation

## 2022-08-12 DIAGNOSIS — N179 Acute kidney failure, unspecified: Secondary | ICD-10-CM | POA: Diagnosis not present

## 2022-08-12 DIAGNOSIS — Z79899 Other long term (current) drug therapy: Secondary | ICD-10-CM | POA: Diagnosis not present

## 2022-08-12 DIAGNOSIS — E871 Hypo-osmolality and hyponatremia: Secondary | ICD-10-CM | POA: Diagnosis not present

## 2022-08-12 DIAGNOSIS — R350 Frequency of micturition: Principal | ICD-10-CM | POA: Insufficient documentation

## 2022-08-12 DIAGNOSIS — Z794 Long term (current) use of insulin: Secondary | ICD-10-CM | POA: Diagnosis not present

## 2022-08-12 DIAGNOSIS — I10 Essential (primary) hypertension: Secondary | ICD-10-CM | POA: Insufficient documentation

## 2022-08-12 DIAGNOSIS — E1100 Type 2 diabetes mellitus with hyperosmolarity without nonketotic hyperglycemic-hyperosmolar coma (NKHHC): Principal | ICD-10-CM | POA: Diagnosis present

## 2022-08-12 DIAGNOSIS — Z23 Encounter for immunization: Secondary | ICD-10-CM | POA: Diagnosis not present

## 2022-08-12 DIAGNOSIS — E860 Dehydration: Secondary | ICD-10-CM | POA: Insufficient documentation

## 2022-08-12 LAB — COMPREHENSIVE METABOLIC PANEL
ALT: 19 U/L (ref 0–44)
AST: 17 U/L (ref 15–41)
Albumin: 4.3 g/dL (ref 3.5–5.0)
Alkaline Phosphatase: 77 U/L (ref 38–126)
Anion gap: 12 (ref 5–15)
BUN: 16 mg/dL (ref 6–20)
CO2: 26 mmol/L (ref 22–32)
Calcium: 9.7 mg/dL (ref 8.9–10.3)
Chloride: 91 mmol/L — ABNORMAL LOW (ref 98–111)
Creatinine, Ser: 1.31 mg/dL — ABNORMAL HIGH (ref 0.61–1.24)
GFR, Estimated: >60 mL/min (ref >60)
Glucose, Bld: 685 mg/dL (ref 70–99)
Potassium: 4.7 mmol/L (ref 3.5–5.1)
Sodium: 129 mmol/L — ABNORMAL LOW (ref 135–145)
Total Bilirubin: 0.7 mg/dL (ref 0.3–1.2)
Total Protein: 7.4 g/dL (ref 6.5–8.1)

## 2022-08-12 LAB — BASIC METABOLIC PANEL
Anion gap: 9 (ref 5–15)
Anion gap: 10 (ref 5–15)
BUN: 13 mg/dL (ref 6–20)
BUN: 11 mg/dL (ref 6–20)
CO2: 24 mmol/L (ref 22–32)
CO2: 26 mmol/L (ref 22–32)
Calcium: 9.2 mg/dL (ref 8.9–10.3)
Calcium: 9.2 mg/dL (ref 8.9–10.3)
Chloride: 100 mmol/L (ref 98–111)
Chloride: 103 mmol/L (ref 98–111)
Creatinine, Ser: 0.97 mg/dL (ref 0.61–1.24)
Creatinine, Ser: 0.94 mg/dL (ref 0.61–1.24)
GFR, Estimated: >60 mL/min (ref >60)
GFR, Estimated: >60 mL/min (ref >60)
Glucose, Bld: 397 mg/dL — ABNORMAL HIGH (ref 70–99)
Glucose, Bld: 117 mg/dL — ABNORMAL HIGH (ref 70–99)
Potassium: 4.1 mmol/L (ref 3.5–5.1)
Potassium: 3.7 mmol/L (ref 3.5–5.1)
Sodium: 133 mmol/L — ABNORMAL LOW (ref 135–145)
Sodium: 139 mmol/L (ref 135–145)

## 2022-08-12 LAB — CBG MONITORING, ED
Glucose-Capillary: >600 mg/dL (ref 70–99)
Glucose-Capillary: 538 mg/dL (ref 70–99)
Glucose-Capillary: 487 mg/dL — ABNORMAL HIGH (ref 70–99)
Glucose-Capillary: 341 mg/dL — ABNORMAL HIGH (ref 70–99)
Glucose-Capillary: 153 mg/dL — ABNORMAL HIGH (ref 70–99)
Glucose-Capillary: 172 mg/dL — ABNORMAL HIGH (ref 70–99)
Glucose-Capillary: 391 mg/dL — ABNORMAL HIGH (ref 70–99)
Glucose-Capillary: 373 mg/dL — ABNORMAL HIGH (ref 70–99)
Glucose-Capillary: 64 mg/dL — ABNORMAL LOW (ref 70–99)
Glucose-Capillary: 91 mg/dL (ref 70–99)
Glucose-Capillary: 171 mg/dL — ABNORMAL HIGH (ref 70–99)
Glucose-Capillary: 175 mg/dL — ABNORMAL HIGH (ref 70–99)

## 2022-08-12 LAB — BETA-HYDROXYBUTYRIC ACID: Beta-Hydroxybutyric Acid: 0.91 mmol/L — ABNORMAL HIGH (ref 0.05–0.27)

## 2022-08-12 LAB — URINALYSIS, ROUTINE W REFLEX MICROSCOPIC
Bacteria, UA: NONE SEEN
Bilirubin Urine: NEGATIVE
Glucose, UA: >=500 mg/dL — AB
Hgb urine dipstick: NEGATIVE
Ketones, ur: NEGATIVE mg/dL
Leukocytes,Ua: NEGATIVE
Nitrite: NEGATIVE
Protein, ur: NEGATIVE mg/dL
Specific Gravity, Urine: 1.027 (ref 1.005–1.030)
pH: 6 (ref 5.0–8.0)

## 2022-08-12 LAB — I-STAT VENOUS BLOOD GAS, ED
Acid-Base Excess: 5 mmol/L — ABNORMAL HIGH (ref 0.0–2.0)
Bicarbonate: 28.3 mmol/L — ABNORMAL HIGH (ref 20.0–28.0)
Calcium, Ion: 1.09 mmol/L — ABNORMAL LOW (ref 1.15–1.40)
HCT: 41 % (ref 39.0–52.0)
Hemoglobin: 13.9 g/dL (ref 13.0–17.0)
O2 Saturation: 97 %
Potassium: 4.7 mmol/L (ref 3.5–5.1)
Sodium: 134 mmol/L — ABNORMAL LOW (ref 135–145)
TCO2: 29 mmol/L (ref 22–32)
pCO2, Ven: 37.2 mmHg — ABNORMAL LOW (ref 44–60)
pH, Ven: 7.489 — ABNORMAL HIGH (ref 7.25–7.43)
pO2, Ven: 84 mmHg — ABNORMAL HIGH (ref 32–45)

## 2022-08-12 LAB — CBC
HCT: 42 % (ref 39.0–52.0)
Hemoglobin: 15 g/dL (ref 13.0–17.0)
MCH: 29.6 pg (ref 26.0–34.0)
MCHC: 35.7 g/dL (ref 30.0–36.0)
MCV: 82.8 fL (ref 80.0–100.0)
Platelets: 232 10*3/uL (ref 150–400)
RBC: 5.07 MIL/uL (ref 4.22–5.81)
RDW: 12.1 % (ref 11.5–15.5)
WBC: 5.3 10*3/uL (ref 4.0–10.5)
nRBC: 0 % (ref 0.0–0.2)

## 2022-08-12 LAB — MAGNESIUM: Magnesium: 2.2 mg/dL (ref 1.7–2.4)

## 2022-08-12 LAB — PHOSPHORUS: Phosphorus: 3.5 mg/dL (ref 2.5–4.6)

## 2022-08-12 LAB — OSMOLALITY: Osmolality: 304 mOsm/kg — ABNORMAL HIGH (ref 275–295)

## 2022-08-12 MED ORDER — SODIUM CHLORIDE 0.9 % IV BOLUS (SEPSIS)
1000.0000 mL | Freq: Once | INTRAVENOUS | Status: AC
  Administered 2022-08-12: 1000 mL via INTRAVENOUS

## 2022-08-12 MED ORDER — ACETAMINOPHEN 325 MG PO TABS: 650.0000 mg | ORAL_TABLET | Freq: Four times a day (QID) | ORAL | Status: DC | PRN

## 2022-08-12 MED ORDER — DEXTROSE IN LACTATED RINGERS 5 % IV SOLN: INTRAVENOUS | Status: DC

## 2022-08-12 MED ORDER — INSULIN GLARGINE-YFGN 100 UNIT/ML ~~LOC~~ SOLN
28.0000 [IU] | Freq: Every day | SUBCUTANEOUS | Status: DC
  Administered 2022-08-12: 28 [IU] via SUBCUTANEOUS
  Filled 2022-08-12 (×2): qty 0.28

## 2022-08-12 MED ORDER — INSULIN REGULAR(HUMAN) IN NACL 100-0.9 UT/100ML-% IV SOLN
INTRAVENOUS | Status: DC
  Administered 2022-08-12: 10.5 [IU]/h via INTRAVENOUS
  Filled 2022-08-12: qty 100

## 2022-08-12 MED ORDER — DEXTROSE 50 % IV SOLN: 0.0000 mL | INTRAVENOUS | Status: DC | PRN

## 2022-08-12 MED ORDER — INFLUENZA VAC A&B SA ADJ QUAD 0.5 ML IM PRSY
0.5000 mL | PREFILLED_SYRINGE | INTRAMUSCULAR | Status: AC
  Administered 2022-08-13: 0.5 mL via INTRAMUSCULAR
  Filled 2022-08-12 (×2): qty 0.5

## 2022-08-12 MED ORDER — ONDANSETRON HCL 4 MG/2ML IJ SOLN: 4.0000 mg | Freq: Four times a day (QID) | INTRAMUSCULAR | Status: DC | PRN

## 2022-08-12 MED ORDER — LACTATED RINGERS IV SOLN: INTRAVENOUS | Status: DC

## 2022-08-12 MED ORDER — INSULIN GLARGINE-YFGN 100 UNIT/ML ~~LOC~~ SOLN
28.0000 [IU] | Freq: Every day | SUBCUTANEOUS | Status: DC
  Filled 2022-08-12: qty 0.28

## 2022-08-12 MED ORDER — INSULIN ASPART 100 UNIT/ML IJ SOLN
7.0000 [IU] | Freq: Three times a day (TID) | INTRAMUSCULAR | Status: DC
  Administered 2022-08-12: 7 [IU] via SUBCUTANEOUS

## 2022-08-12 MED ORDER — INSULIN ASPART 100 UNIT/ML IJ SOLN
7.0000 [IU] | Freq: Three times a day (TID) | INTRAMUSCULAR | Status: DC
  Administered 2022-08-12 – 2022-08-13 (×3): 7 [IU] via SUBCUTANEOUS

## 2022-08-12 MED ORDER — ENOXAPARIN SODIUM 40 MG/0.4ML IJ SOSY
40.0000 mg | PREFILLED_SYRINGE | Freq: Every day | INTRAMUSCULAR | Status: DC
  Filled 2022-08-12 (×2): qty 0.4

## 2022-08-12 MED ORDER — INSULIN ASPART 100 UNIT/ML IJ SOLN: 0.0000 [IU] | Freq: Three times a day (TID) | INTRAMUSCULAR | Status: DC

## 2022-08-12 MED ORDER — INSULIN ASPART 100 UNIT/ML IJ SOLN
0.0000 [IU] | Freq: Three times a day (TID) | INTRAMUSCULAR | Status: DC
  Administered 2022-08-12: 15 [IU] via SUBCUTANEOUS
  Administered 2022-08-13 (×2): 8 [IU] via SUBCUTANEOUS
  Administered 2022-08-14: 15 [IU] via SUBCUTANEOUS
  Administered 2022-08-14: 3 [IU] via SUBCUTANEOUS

## 2022-08-12 MED ORDER — INSULIN REGULAR(HUMAN) IN NACL 100-0.9 UT/100ML-% IV SOLN
INTRAVENOUS | Status: DC
  Administered 2022-08-12: 9 [IU]/h via INTRAVENOUS

## 2022-08-12 NOTE — Inpatient Diabetes Management (Addendum)
Inpatient Diabetes Program Recommendations  AACE/ADA: New Consensus Statement on Inpatient Glycemic Control (2015)  Target Ranges:  Prepandial:   less than 140 mg/dL      Peak postprandial:   less than 180 mg/dL (1-2 hours)      Critically ill patients:  140 - 180 mg/dL    Latest Reference Range & Units 08/12/22 03:36  Sodium 135 - 145 mmol/L 129 (L)  Potassium 3.5 - 5.1 mmol/L 4.7  Chloride 98 - 111 mmol/L 91 (L)  CO2 22 - 32 mmol/L 26  Glucose 70 - 99 mg/dL 685 (HH)  BUN 6 - 20 mg/dL 16  Creatinine 0.61 - 1.24 mg/dL 1.31 (H)  Calcium 8.9 - 10.3 mg/dL 9.7  Anion gap 5 - 15  12  (HH): Data is critically high (L): Data is abnormally low (H): Data is abnormally high  Latest Reference Range & Units 08/12/22 03:02 08/12/22 06:23 08/12/22 08:26  Glucose-Capillary 70 - 99 mg/dL >600 (HH) 538 (HH) 487 (H)  IV Insulin Drip Started  ([place]): Data is critically high (H): Data is abnormally high   Admit with:  Hyperglycemia (ran out of insulin at home) He reports he was recently released from jail and stated previously he felt his insulin was expired.  Reported polyuria & polydipsia  22 ED visits since Jan 2023  History: DM  Home DM Meds: Novolin 70/30 Insulin 28 units BID       Regular Insulin SSI  Current Orders: IV Insulin Drip    Counseled by the Diabetes RN back in Oct 2023 during admission  Needs to stay on the IV Insulin Drip until Glucose stable--Please leave on drip until pt has at least 4 consecutive CBGs <180  Should be able to purchase 70/30 Insulin pen from [employer] for $45 (5 pens) out of pocket cash pay no Rx required.   Addendum 11:15am--Went down to ED to speak with pt.  Pt was lying on side on stretcher and refused to open his eyes and speak with me.  I shook the foot of his bed, knocked on the door loudly and called his name loudly multiple times without success.  Pt opened his eyes briefly, looked at me, and then closed his eyes and would not open his eyes  again despite me calling out his name loudly again several times.    --Will follow patient during hospitalization--  Wyn Quaker RN, MSN, [place] Diabetes Coordinator Inpatient Glycemic Control Team Team Pager: [phone] (8a-5p)

## 2022-08-12 NOTE — ED Provider Notes (Signed)
[facility] EMERGENCY DEPARTMENT Provider Note   CSN: [id number] Arrival date & time: 08/12/22  0242     History  Chief Complaint  Patient presents with   Hyperglycemia    [name] [name] is a 37 y.o. male.  The history is provided by the patient.  Patient with history of DM-II presents with hyperglycemia.  Patient reports he has been out of his insulin.  He reports he was recently released from jail and stated previously he felt his insulin was expired. Patient reported polyuria & polydipsia. He has no pain complaints    Past Medical History:  Diagnosis Date   Diabetes mellitus without complication ([place])    DKA (diabetic ketoacidosis) ([place]) 05/08/2022   Hyperosmolar hyperglycemic state (HHS) ([place]) 05/08/2022   Hypertension    Hypoglycemia 07/02/2019   Pseudohyponatremia 05/08/2022    Home Medications Prior to Admission medications   Medication Sig Start Date End Date Taking? Authorizing Provider  amLODipine (NORVASC) 10 MG tablet Take 1 tablet (10 mg total) by mouth daily. Patient not taking: Reported on 06/10/2022 06/09/22   Fredia Sorrow, MD  insulin isophane & regular human KwikPen (NOVOLIN 70/30 KWIKPEN) (70-30) 100 UNIT/ML KwikPen Inject 28 Units into the skin 2 (two) times daily. 06/11/22   Valarie Merino, MD  insulin regular (HUMULIN R) 100 units/mL injection As per sliding scale **discard 31 days after opening** 06/11/22   Valarie Merino, MD  lisinopril (ZESTRIL) 10 MG tablet Take 1 tablet (10 mg total) by mouth daily. Patient not taking: Reported on 06/10/2022 06/09/22   Fredia Sorrow, MD      Allergies    Patient has no known allergies.    Review of Systems   Review of Systems  Physical Exam Updated Vital Signs BP (!) 117/104   Pulse (!) 112   Temp 98.9 F (37.2 C)   Resp 16   Ht 1.803 m (5\' 11" )   Wt 95.7 kg   SpO2 99%   BMI 29.43 kg/m  Physical Exam CONSTITUTIONAL: Disheveled, resting comfortably HEAD:  Normocephalic/atraumatic EYES: EOMI/PERRL, conjunctival erythema bilaterally ENMT: Mucous membranes moist NECK: supple no meningeal signs CV: S1/S2 noted, tachycardic LUNGS: Lungs are clear to auscultation bilaterally, no apparent distress ABDOMEN: soft, nontender NEURO: Pt is resting comfortably but easily arousable.  He moves all extremities x 4 EXTREMITIES: pulses normal/equal, full ROM, no large wounds or ulcers noted to his feet SKIN: warm, color normal  ED Results / Procedures / Treatments   Labs (all labs ordered are listed, but only abnormal results are displayed) Labs Reviewed  URINALYSIS, ROUTINE W REFLEX MICROSCOPIC - Abnormal; Notable for the following components:      Result Value   Color, Urine STRAW (*)    Glucose, UA >=500 (*)    All other components within normal limits  COMPREHENSIVE METABOLIC PANEL - Abnormal; Notable for the following components:   Sodium 129 (*)    Chloride 91 (*)    Glucose, Bld 685 (*)    Creatinine, Ser 1.31 (*)    All other components within normal limits  CBG MONITORING, ED - Abnormal; Notable for the following components:   Glucose-Capillary >600 (*)    All other components within normal limits  CBG MONITORING, ED - Abnormal; Notable for the following components:   Glucose-Capillary 538 (*)    All other components within normal limits  CBC  BETA-HYDROXYBUTYRIC ACID    EKG None  Radiology No results found.  Procedures .Critical Care  Performed by:  Ripley Fraise, MD Authorized by: Ripley Fraise, MD   Critical care provider statement:    Critical care time (minutes):  35   Critical care start time:  08/12/2022 6:30 AM   Critical care end time:  08/12/2022 7:05 AM   Critical care was necessary to treat or prevent imminent or life-threatening deterioration of the following conditions:  Dehydration, endocrine crisis and metabolic crisis   Critical care was time spent personally by me on the following activities:   Examination of patient, re-evaluation of patient's condition, ordering and review of laboratory studies, ordering and performing treatments and interventions and development of treatment plan with patient or surrogate   I assumed direction of critical care for this patient from another provider in my specialty: no     Care discussed with: admitting provider       Medications Ordered in ED Medications  sodium chloride 0.9 % bolus 1,000 mL (1,000 mLs Intravenous New Bag/Given 08/12/22 0645)  insulin regular, human (MYXREDLIN) 100 units/ 100 mL infusion (has no administration in time range)  dextrose 50 % solution 0-50 mL (has no administration in time range)  sodium chloride 0.9 % bolus 1,000 mL (1,000 mLs Intravenous New Bag/Given 08/12/22 0645)    ED Course/ Medical Decision Making/ A&P Clinical Course as of 08/12/22 0711  Mon Aug 12, 2022  0531 Glucose(!!): 685 Significant hyperglycemia [DW]  0531 Creatinine(!): 1.31 Renal insufficiency [DW]  0621 Patient well-known to the emergency department presents for his 17th ER visit in 6 months.  Reportedly just got out of jail has had difficulty controlling his glucose.  Patient appears dehydrated and is hyperglycemic without signs of DKA.  IV fluids and insulin have been ordered [DW]  0708 Discussed with internal medicine resident for admission [DW]  [phone] Patient sleeping throughout most of his stay, but easily arousable and otherwise no acute distress.  Patient would benefit from admission for glucose management and arrangements for outpatient primary care provider. [DW]    Clinical Course User Index [DW] Ripley Fraise, MD                             Medical Decision Making Amount and/or Complexity of Data Reviewed Labs: ordered. Decision-making details documented in ED Course. ECG/medicine tests: ordered.  Risk Prescription drug management. Decision regarding hospitalization.   This patient presents to the ED for concern of  hyperglycemia, this involves an extensive number of treatment options, and is a complaint that carries with it a high risk of complications and morbidity.  The differential diagnosis includes but is not limited to diabetic ketoacidosis, hyperosmolar hyperglycemic state, lab error, hyperglycemia  Comorbidities that complicate the patient evaluation: Patient's presentation is complicated by their history of diabetes  Social Determinants of Health: Patient's impaired access to primary care and recently incarcerated   increases the complexity of managing their presentation  Additional history obtained: Records reviewed previous admission documents  Lab Tests: I Ordered, and personally interpreted labs.  The pertinent results include: Hyperglycemia without anion gap  Cardiac Monitoring: The patient was maintained on a cardiac monitor.  I personally viewed and interpreted the cardiac monitor which showed an underlying rhythm of:  sinus tachycardia  Medicines ordered and prescription drug management: I ordered medication including IV fluids and insulin for hyperglycemia Reevaluation of the patient after these medicines showed that the patient    stayed the same   Critical Interventions:  IV fluids  Consultations Obtained: I requested consultation  with the admitting physician IM resident , and discussed  findings as well as pertinent plan - they recommend: admit  Reevaluation: After the interventions noted above, I reevaluated the patient and found that they have :stayed the same  Complexity of problems addressed: Patient's presentation is most consistent with  acute presentation with potential threat to life or bodily function  Disposition: After consideration of the diagnostic results and the patient's response to treatment,  I feel that the patent would benefit from admission   .           Final Clinical Impression(s) / ED Diagnoses Final diagnoses:  Hyperosmolar  hyperglycemic state (HHS) ([facility])  Dehydration    Rx / DC Orders ED Discharge Orders     None         Zadie Rhine, MD 08/12/22 [phone]

## 2022-08-12 NOTE — ED Notes (Signed)
Dr Christy Gentles notified that the patient is a difficult stick and only has a 22g IV in place at this time. Dr Christy Gentles states that he is aware and that he would like to start with IV fluids first until usgpiv is placed.

## 2022-08-12 NOTE — ED Provider Triage Note (Signed)
Emergency Medicine Provider Triage Evaluation Note  [name] [name] , a 37 y.o. male  was evaluated in triage.  Pt complains of hyperglycemia. Was released from jail last week. States he has been using his home insulin, but believes it "went bad" and expired while he was incarcerated. Notes polyuria, polydipsia. Denies fever.  Review of Systems  Positive: As above Negative: As above  Physical Exam  There were no vitals taken for this visit. Gen:   Awake, no distress   Resp:  Normal effort  MSK:   Moves extremities without difficulty  Other:  Mildly dry mm. Tachycardic HR.  Medical Decision Making  Medically screening exam initiated at 3:02 AM.  Appropriate orders placed.  [name] [name] was informed that the remainder of the evaluation will be completed by another provider, this initial triage assessment does not replace that evaluation, and the importance of remaining in the ED until their evaluation is complete.  Hyperglycemia - labs initiated.   Antonietta Breach, PA-C 08/12/22 7510

## 2022-08-12 NOTE — ED Notes (Signed)
Ordered pt a lunch tray 

## 2022-08-12 NOTE — ED Triage Notes (Signed)
Pt arrives with c/o hyperglycemia due to not having his insulin. Per pt, last time he checked his blood sugar it was in the 700s. Pt endorse polyuria and increased thirst.

## 2022-08-12 NOTE — ED Notes (Signed)
ED phlebotomist consulted for BMP lab draw, IV not pulling back

## 2022-08-12 NOTE — ED Notes (Signed)
Patinet provided with urinal and given falls risk and call bell education at this time

## 2022-08-12 NOTE — ED Notes (Signed)
Patient's CBG reported to Dr Johnnye Sima at this time (cbg 175)

## 2022-08-12 NOTE — ED Notes (Signed)
2 unsuccessful IV sticks at this time patient reports that he normally has to have usgpiv team.

## 2022-08-12 NOTE — H&P (Addendum)
Date: 08/12/2022               Patient Name:  [name] [name] MRN: [id number]  DOB: 20-Jan-1986 Age / Sex: 36 y.o., male   PCP: Pcp, No         Medical Service: Internal Medicine Teaching Service         Attending Physician: Dr. Campbell Riches, MD      First Contact: Dr. [name], DO Pager [phone]    Second Contact: Dr. Farrel Gordon, DO Pager [phone]         After Hours (After 5p/  First Contact Pager: [phone]  weekends / holidays): Second Contact Pager: [phone]   SUBJECTIVE   Chief Complaint: Elevated glucose, urinary frequency  History of Present Illness:   [name] [name] is a 37 y/o person living with a history of type of diabetes who presents with increased urinary frequency, nausea, and vomiting in the setting of elevated glucose levels. He was released from jail 4 days ago and has not had his insulin since. He began having increased urinary frequency, nausea, and vomiting with an inability to keep any food down. He denies following with a PCP and has his insulin prescribed during his ED visits. He denies any systemic symptoms of fevers, chills, chest pain, shortness of breath, or difficulty passing his bowels.   When asked about his recurrent ED visits and if he had access to his insulin, states he has access to his insulin but does not give it to himself. HPI limited as patient would frequently fall back asleep. When mentioning NPO status he endorses wanting to leaving AMA if he cannot eat. We discussed importance of monitoring glucose levels and getting them under control. If improve can start diet later today.   Meds:  Novolin 70/30 29 U BID, regular insulin SSI  Past Medical History Type 1 diabetes mellitus HTN  Past Surgical History:  Procedure Laterality Date   HAND SURGERY     Social:  Lives With: cousin Occupation: Doe snot currently work Support: Family in the area Level of Function: Able to perform ADL/IADL without assistance PCP: No PCP Substances:  Currently smokes 1 PPD, denies alcohol or other substances  Family History:  Denies pertinent family history  Allergies: Allergies as of 08/12/2022   (No Known Allergies)   Review of Systems: A complete ROS was negative except as per HPI.   OBJECTIVE:   Physical Exam: Blood pressure (!) 177/117, pulse 77, temperature 98.8 F (37.1 C), temperature source Oral, resp. rate 14, height 5\' 11"  (1.803 m), weight 95.7 kg, SpO2 100 %.  Constitutional: no acute distress, resting comfortably HENT: normocephalic atraumatic, mucous membranes dry Eyes: conjunctiva non-erythematous Neck: supple Cardiovascular: regular rate and rhythm, no m/r/g Pulmonary/Chest: normal work of breathing on room air, lungs clear to auscultation bilaterally Abdominal: soft, non-tender, non-distended MSK: normal bulk and tone Neurological: Answers questions appropriately. Limited exam due to lethargy Skin: warm and dry. Rough, scaly patches scalp Psych: Limited interaction as patient wanted to keep sleeping  Labs: CBC    Component Value Date/Time   WBC 5.3 08/12/2022 0312   RBC 5.07 08/12/2022 0312   HGB 15.0 08/12/2022 0312   HCT 42.0 08/12/2022 0312   PLT 232 08/12/2022 0312   MCV 82.8 08/12/2022 0312   MCH 29.6 08/12/2022 0312   MCHC 35.7 08/12/2022 0312   RDW 12.1 08/12/2022 0312   LYMPHSABS 1.3 06/27/2022 0040   MONOABS 0.6 06/27/2022 0040   EOSABS 0.0 06/27/2022 0040  BASOSABS 0.0 06/27/2022 0040     CMP     Component Value Date/Time   NA 133 (L) 08/12/2022 0914   K 4.1 08/12/2022 0914   CL 100 08/12/2022 0914   CO2 24 08/12/2022 0914   GLUCOSE 397 (H) 08/12/2022 0914   BUN 13 08/12/2022 0914   CREATININE 0.97 08/12/2022 0914   CALCIUM 9.2 08/12/2022 0914   PROT 7.4 08/12/2022 0336   ALBUMIN 4.3 08/12/2022 0336   AST 17 08/12/2022 0336   ALT 19 08/12/2022 0336   ALKPHOS 77 08/12/2022 0336   BILITOT 0.7 08/12/2022 0336   GFRNONAA >60 08/12/2022 0914   GFRAA >60 02/12/2020 0509     Imaging: None  EKG: personally reviewed my interpretation is normal sinus rhythm with a rate of 90 bpm. LAD. Normal intervals. ST wave abnormalities in anterio-latearl leads. Prior EKG 05/2023 without significant difference.   ASSESSMENT & PLAN:   Assessment & Plan by Problem: Active Problems:   Hyperosmolar hyperglycemic state (HHS) ([facility])  [name] [name] is a 37 y.o. person living with a history of uncontrolled type 1 DM recently released from jail who presented with nausea, vomiting, urinary frequency and admitted for hyperosmolar hyperglycemic state on hospital day 1  Hyperosmolar hyperglycemic state Hx of uncontrolled type 1 diabetes Multiple ED visits for hyperglycemia in setting of T1DM with non-adherence to his insulin regimen. Has not had insulin for the past few days since being released from jail. Does not currently have a PCP, appears prior prescriptions written by ED providers. On exam his is lethargic, but able to answer questions appropriately. Frequently fell back asleep during exam. Presented today with glucose of over 600 and normal anion gap and bicarbonate. UA without ketones and betahydroxybuterate minimally elevated. Pending osmolality. Workup consistent with HHS in setting of medication non-adherence. Do not believe other inciting event such as infectious or cardiac etiology. Continue endotool and once lethargy resolves can transition to basal-bolus insulin and start a diet.  -continue endotool, transition off when lethargy improves and able to eat -continue IV fluids, LR and transition to LR D5 once glucose below 250 -check two more BMP's q4h, mag and phos pending -TOC for PCP needs and medication assistance -A1c pending  Hyponatremia Likely hyperosmolar hyperglycemia with elevated glucose levels. Most recent Na of 133 from 129. Goal correction would be around 8 mmol/L today (137-normal). Will continue HHS management as per above.  -BMP q4h -osmolality  pending -correction goal of 137 mmol/L today  Non-oliguric acute kidney injury Presented with Cr of 1.3, normalized on repeat BMP. Likely pre-renal etiology with HHS. If renal function worsens can further work up with UA and renal US.  -daily BMP  Hypertension Per chart review history of hypertension, has been prescribed lisinopril and amlodipine in the past. When asked home medications he denied anti-hypertensives. Will hold on starting medications for now with normal blood pressures.  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 Denies chest pain or shortness of breath. Do not suspect ACS as precipitating factor for his presentation.  -outpatient follow up with PCP  Seborrheic dermatitis of the scalp Patient endorses "dandruff" since he had diabetes. HIV antibody negative 3 months ago. -continue to treat outpatient, prescribe ketoconazole 2% shampoo at discharge.  Diet: NPO VTE: Enoxaparin IVF: LR infusion, HHS protocol Code: Full  Prior to Admission Living Arrangement: Home, living cousin Anticipated Discharge Location: Home Barriers to Discharge: resolution of  HHS  Dispo: Admit patient to Observation with expected length of stay less than 2 midnights.  Signed: Riesa Pope, MD Internal Medicine Resident PGY-3  08/12/2022, 12:14 PM

## 2022-08-12 NOTE — ED Notes (Signed)
Pt requesting blood sugar check, states that he feels it is low. CBG 64. Pt given 1 juice and 1 sandwich.

## 2022-08-12 NOTE — ED Notes (Signed)
Mary RN at bedside to attempt to place usgpiv

## 2022-08-12 NOTE — Progress Notes (Addendum)
Reevaluated at 1236, patient awake and less lethargic. Answering questions appropriately and wants to eat/drink. Last 2 glucose readings for 153,172. Will transition off of endotool and start diet. Will also DC fluids and follow up BMP, hopefully sodium has normalized.   Addendum: Sodium normalized, okay with rate of normalization as suspect acute hyponatremia in setting of HHS.

## 2022-08-12 NOTE — ED Notes (Signed)
Requested Semglee from pharmacy

## 2022-08-12 NOTE — ED Notes (Signed)
Pt not cooperating with staff when asked to get into wheelchair for a room, stating he would rather sleep. Pt asked if he wanted to talk to a doctor, pt shook his head. Pt told he needed to be seen, pt not speaking with this tech. Security notified

## 2022-08-13 ENCOUNTER — Other Ambulatory Visit (HOSPITAL_COMMUNITY): Payer: Self-pay

## 2022-08-13 DIAGNOSIS — E860 Dehydration: Secondary | ICD-10-CM | POA: Diagnosis not present

## 2022-08-13 DIAGNOSIS — Z794 Long term (current) use of insulin: Secondary | ICD-10-CM | POA: Diagnosis not present

## 2022-08-13 DIAGNOSIS — E1100 Type 2 diabetes mellitus with hyperosmolarity without nonketotic hyperglycemic-hyperosmolar coma (NKHHC): Secondary | ICD-10-CM | POA: Diagnosis not present

## 2022-08-13 LAB — BASIC METABOLIC PANEL
Anion gap: 11 (ref 5–15)
BUN: 20 mg/dL (ref 6–20)
CO2: 22 mmol/L (ref 22–32)
Calcium: 8.7 mg/dL — ABNORMAL LOW (ref 8.9–10.3)
Chloride: 100 mmol/L (ref 98–111)
Creatinine, Ser: 1.12 mg/dL (ref 0.61–1.24)
GFR, Estimated: >60 mL/min (ref >60)
Glucose, Bld: 163 mg/dL — ABNORMAL HIGH (ref 70–99)
Potassium: 4.1 mmol/L (ref 3.5–5.1)
Sodium: 133 mmol/L — ABNORMAL LOW (ref 135–145)

## 2022-08-13 LAB — CBG MONITORING, ED
Glucose-Capillary: 117 mg/dL — ABNORMAL HIGH (ref 70–99)
Glucose-Capillary: 259 mg/dL — ABNORMAL HIGH (ref 70–99)
Glucose-Capillary: 286 mg/dL — ABNORMAL HIGH (ref 70–99)
Glucose-Capillary: 256 mg/dL — ABNORMAL HIGH (ref 70–99)
Glucose-Capillary: 90 mg/dL (ref 70–99)

## 2022-08-13 LAB — HEMOGLOBIN A1C
Hgb A1c MFr Bld: 11.8 % — ABNORMAL HIGH (ref 4.8–5.6)
Mean Plasma Glucose: 292 mg/dL

## 2022-08-13 LAB — GLUCOSE, CAPILLARY: Glucose-Capillary: 398 mg/dL — ABNORMAL HIGH (ref 70–99)

## 2022-08-13 MED ORDER — INSULIN ASPART 100 UNIT/ML IJ SOLN
7.0000 [IU] | Freq: Three times a day (TID) | INTRAMUSCULAR | Status: DC
  Administered 2022-08-13: 7 [IU] via SUBCUTANEOUS

## 2022-08-13 MED ORDER — INSULIN GLARGINE-YFGN 100 UNIT/ML ~~LOC~~ SOLN
40.0000 [IU] | Freq: Every day | SUBCUTANEOUS | Status: DC
  Administered 2022-08-13: 40 [IU] via SUBCUTANEOUS
  Filled 2022-08-13 (×2): qty 0.4

## 2022-08-13 MED ORDER — LISINOPRIL 10 MG PO TABS
10.0000 mg | ORAL_TABLET | Freq: Every day | ORAL | Status: DC
  Administered 2022-08-13 – 2022-08-14 (×2): 10 mg via ORAL
  Filled 2022-08-13 (×2): qty 1

## 2022-08-13 MED ORDER — INSULIN ASPART 100 UNIT/ML IJ SOLN: 10.0000 [IU] | Freq: Three times a day (TID) | INTRAMUSCULAR | Status: DC

## 2022-08-13 NOTE — ED Notes (Signed)
Pt requested soap, deodorant, and wash clothes to clean himself at bedside.

## 2022-08-13 NOTE — ED Notes (Signed)
Ambulates to restroom at this time with a steady gait 

## 2022-08-13 NOTE — Inpatient Diabetes Management (Signed)
Inpatient Diabetes Program Recommendations  AACE/ADA: New Consensus Statement on Inpatient Glycemic Control (2015)  Target Ranges:  Prepandial:   less than 140 mg/dL      Peak postprandial:   less than 180 mg/dL (1-2 hours)      Critically ill patients:  140 - 180 mg/dL   Lab Results  Component Value Date   GLUCAP 256 (H) 08/13/2022   HGBA1C 11.8 (H) 08/12/2022    Review of Glycemic Control  Diabetes history: DM2 Outpatient Diabetes medications: Novolin 70/30 Insulin 28 BID + Regular Insulin SSI  Current orders for Inpatient glycemic control: Semglee 40 units qd, Novolog 10 units tid meal coverage, Novolog 0-15 units tid correction  Inpatient Diabetes Program Recommendations:   Spoke with patient @ bedside. Patient states he needs a PCP and insulin expired while he was in jail. Placed consult for Transition of care regarding medication and PCP.  Will follow while inpatient.  Thank you, Nani Gasser. Takiah Maiden, RN, MSN, CDE  Diabetes Coordinator Inpatient Glycemic Control Team Team Pager [phone] (8am-5pm) 08/13/2022 2:47 PM

## 2022-08-13 NOTE — Hospital Course (Addendum)
Hyperosmolar hyperglycemic state Hx of uncontrolled type 1 diabetes Patient presented with increased urinary frequency, nausea, and vomiting with hyperglycemia in the setting of not having insulin. He was lethargic and his blood sugar was >600 with a normal anion gap and bicarbonate level. Urinalysis did not have ketones and his beta-hydroxybutyric acid was minimally elevated. Osmolality was minimally elevated to 304. HbA1c was 11.8%. He was admitted and started on endotool and ultimately transitioned to both long- and short-acting insulin while his diet was progressed. During his admission, his insulin regimen was adjusted to optimize his blood glucose levels.   Hyponatremia Hyponatremia to 129 on admission with correction to 138. Hyponatremia was normal prior to discharge.   Acute kidney injury Creatinine elevated to 1.3, thought to be pre-renal in nature. Resolved prior to discharge.   Hypertension Patient denied taking anti-hypertensives outpatient. He had hypertension throughout admission and was restarted on amlodipine and lisinopril prior to discharge.   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 Patient was asymptomatic. Recommend follow-up with PCP.    Seborrheic dermatitis of the scalp HIV antibody negative 3 months ago. Patient given ketoconazole 2% shampoo for this concern.

## 2022-08-13 NOTE — ED Notes (Signed)
Pt ambulated to the restroom with a steady gait.

## 2022-08-13 NOTE — ED Notes (Signed)
ED TO INPATIENT HANDOFF REPORT  ED Nurse Name and Phone #:  [facility] RN [phone]  S Name/Age/Gender [name] [name] 37 y.o. male Room/Bed: 013C/013C  Code Status   Code Status: Full Code  Home/SNF/Other Home Patient oriented to: self, place, time, and situation Is this baseline? Yes   Triage Complete: Triage complete  Chief Complaint Hyperosmolar hyperglycemic state (HHS) (HCC) [E11.00]  Triage Note Pt arrives with c/o hyperglycemia due to not having his insulin. Per pt, last time he checked his blood sugar it was in the 700s. Pt endorse polyuria and increased thirst.    Allergies No Known Allergies  Level of Care/Admitting Diagnosis ED Disposition     ED Disposition  Admit   Condition  --   Comment  Hospital Area: [name] [[id number]]  Level of Care: Progressive [102]  Admit to Progressive based on following criteria: GI, ENDOCRINE disease patients with GI bleeding, acute liver failure [name] pancreatitis, stable with diabetic ketoacidosis [name] thyrotoxicosis (hypothyroid) state.  May place patient in observation at [name] if equivalent level of care is available:: No  Covid Evaluation: Asymptomatic - no recent exposure (last 10 days) testing not required  Diagnosis: Hyperosmolar hyperglycemic state (HHS) [facility]) [[id number]]  Admitting Physician: Ginnie Smart [[id number]]  Attending Physician: HATCHER, JEFFREY C [[id number]]          B Medical/Surgery History Past Medical History:  Diagnosis Date   Diabetes mellitus without complication (HCC)    DKA (diabetic ketoacidosis) (HCC) 05/08/2022   Hyperosmolar hyperglycemic state (HHS) (HCC) 05/08/2022   Hypertension    Hypoglycemia 07/02/2019   Pseudohyponatremia 05/08/2022   Past Surgical History:  Procedure Laterality Date   HAND SURGERY       A IV Location/Drains/Wounds Patient Lines/Drains/Airways Status     Active Line/Drains/Airways     Name Placement date Placement time Site Days    Peripheral IV 08/12/22 22 G Anterior;Right Forearm 08/12/22  0638  Forearm  1   Peripheral IV 08/12/22 22 G 1.75" Anterior;Right Forearm 08/12/22  0812  Forearm  1            Intake/Output Last 24 hours  Intake/Output Summary (Last 24 hours) at 08/13/2022 1324 Last data filed at 08/12/2022 1638 Gross per 24 hour  Intake 614.83 ml  Output --  Net 614.83 ml    Labs/Imaging Results for orders placed [name] performed during the hospital encounter of 08/12/22 (from the past 48 hour(s))  CBG monitoring, ED     Status: Abnormal   Collection Time: 08/12/22  3:02 AM  Result Value Ref Range   Glucose-Capillary >600 (HH) 70 - 99 mg/dL    Comment: Glucose reference range applies only to samples taken after fasting for at least 8 hours.  CBC     Status: None   Collection Time: 08/12/22  3:12 AM  Result Value Ref Range   WBC 5.3 4.0 - 10.5 K/uL   RBC 5.07 4.22 - 5.81 MIL/uL   Hemoglobin 15.0 13.0 - 17.0 g/dL   HCT 98.1 19.1 - 47.8 %   MCV 82.8 80.0 - 100.0 fL   MCH 29.6 26.0 - 34.0 pg   MCHC 35.7 30.0 - 36.0 g/dL   RDW 29.5 62.1 - 30.8 %   Platelets 232 150 - 400 K/uL   nRBC 0.0 0.0 - 0.2 %    Comment: Performed at [facility], [address] [place], [place] [address]  Hemoglobin A1c     Status:  Abnormal   Collection Time: 08/12/22  3:12 AM  Result Value Ref Range   Hgb A1c MFr Bld 11.8 (H) 4.8 - 5.6 %    Comment: (NOTE)         Prediabetes: 5.7 - 6.4         Diabetes: >6.4         Glycemic control for adults with diabetes: <7.0    Mean Plasma Glucose 292 mg/dL    Comment: (NOTE) Performed At: [facility] [place], [place] [id number] Rush Farmer MD QP:[id number]   Comprehensive metabolic panel     Status: Abnormal   Collection Time: 08/12/22  3:36 AM  Result Value Ref Range   Sodium 129 (L) 135 - 145 mmol/L   Potassium 4.7 3.5 - 5.1 mmol/L    Comment: HEMOLYSIS AT THIS LEVEL MAY AFFECT RESULT   Chloride 91 (L) 98 - 111 mmol/L   CO2 26 22  - 32 mmol/L   Glucose, Bld 685 (HH) 70 - 99 mg/dL    Comment: CRITICAL RESULT CALLED TO, READ BACK BY AND VERIFIED WITH K.MOON,RN. 1245 08/12/22. LPAIT Glucose reference range applies only to samples taken after fasting for at least 8 hours.    BUN 16 6 - 20 mg/dL   Creatinine, Ser 1.31 (H) 0.61 - 1.24 mg/dL   Calcium 9.7 8.9 - 10.3 mg/dL   Total Protein 7.4 6.5 - 8.1 g/dL   Albumin 4.3 3.5 - 5.0 g/dL   AST 17 15 - 41 U/L    Comment: HEMOLYSIS AT THIS LEVEL MAY AFFECT RESULT   ALT 19 0 - 44 U/L    Comment: HEMOLYSIS AT THIS LEVEL MAY AFFECT RESULT   Alkaline Phosphatase 77 38 - 126 U/L   Total Bilirubin 0.7 0.3 - 1.2 mg/dL    Comment: HEMOLYSIS AT THIS LEVEL MAY AFFECT RESULT   GFR, Estimated >60 >60 mL/min    Comment: (NOTE) Calculated using the CKD-EPI Creatinine Equation (2021)    Anion gap 12 5 - 15    Comment: Performed at [facility], [place] [address] [place], [place] [address]  Urinalysis, Routine w reflex microscopic Urine, Clean Catch     Status: Abnormal   Collection Time: 08/12/22  3:49 AM  Result Value Ref Range   Color, Urine STRAW (A) YELLOW   APPearance CLEAR CLEAR   Specific Gravity, Urine 1.027 1.005 - 1.030   pH 6.0 5.0 - 8.0   Glucose, UA >=500 (A) NEGATIVE mg/dL   Hgb urine dipstick NEGATIVE NEGATIVE   Bilirubin Urine NEGATIVE NEGATIVE   Ketones, ur NEGATIVE NEGATIVE mg/dL   Protein, ur NEGATIVE NEGATIVE mg/dL   Nitrite NEGATIVE NEGATIVE   Leukocytes,Ua NEGATIVE NEGATIVE   RBC / HPF 0-5 0 - 5 RBC/hpf   WBC, UA 0-5 0 - 5 WBC/hpf   Bacteria, UA NONE SEEN NONE SEEN   Squamous Epithelial / HPF 0-5 0 - 5 /HPF    Comment: Performed at [facility], [place] [address], [place], [place] [address]  CBG monitoring, ED     Status: Abnormal   Collection Time: 08/12/22  6:23 AM  Result Value Ref Range   Glucose-Capillary 538 (HH) 70 - 99 mg/dL    Comment: Glucose reference range applies only to samples taken after fasting for at least 8 hours.   Comment 1  Notify RN   Beta-hydroxybutyric acid     Status: Abnormal   Collection Time: 08/12/22  7:30 AM  Result Value  Ref Range   Beta-Hydroxybutyric Acid 0.91 (H) 0.05 - 0.27 mmol/L    Comment: Performed at [facility], [address], [place], [place] [address]  CBG monitoring, ED     Status: Abnormal   Collection Time: 08/12/22  8:26 AM  Result Value Ref Range   Glucose-Capillary 487 (H) 70 - 99 mg/dL    Comment: Glucose reference range applies only to samples taken after fasting for at least 8 hours.  Basic metabolic panel     Status: Abnormal   Collection Time: 08/12/22  9:14 AM  Result Value Ref Range   Sodium 133 (L) 135 - 145 mmol/L   Potassium 4.1 3.5 - 5.1 mmol/L   Chloride 100 98 - 111 mmol/L   CO2 24 22 - 32 mmol/L   Glucose, Bld 397 (H) 70 - 99 mg/dL    Comment: Glucose reference range applies only to samples taken after fasting for at least 8 hours.   BUN 13 6 - 20 mg/dL   Creatinine, Ser 1.01 0.61 - 1.24 mg/dL   Calcium 9.2 8.9 - 75.1 mg/dL   GFR, Estimated >02 >58 mL/min    Comment: (NOTE) Calculated using the CKD-EPI Creatinine Equation (2021)    Anion gap 9 5 - 15    Comment: Performed at [facility], [address], [place], [place] [address]  Osmolality     Status: Abnormal   Collection Time: 08/12/22  9:14 AM  Result Value Ref Range   Osmolality 304 (H) 275 - 295 mOsm/kg    Comment: REPEATED TO VERIFY Performed at [facility], [address] [place], [place] [address]   CBG monitoring, ED     Status: Abnormal   Collection Time: 08/12/22  9:44 AM  Result Value Ref Range   Glucose-Capillary 341 (H) 70 - 99 mg/dL    Comment: Glucose reference range applies only to samples taken after fasting for at least 8 hours.  CBG monitoring, ED     Status: Abnormal   Collection Time: 08/12/22 11:16 AM  Result Value Ref Range   Glucose-Capillary 153 (H) 70 - 99 mg/dL    Comment: Glucose reference range applies only to samples taken after fasting for  at least 8 hours.  CBG monitoring, ED     Status: Abnormal   Collection Time: 08/12/22 12:15 PM  Result Value Ref Range   Glucose-Capillary 172 (H) 70 - 99 mg/dL    Comment: Glucose reference range applies only to samples taken after fasting for at least 8 hours.  Basic metabolic panel     Status: Abnormal   Collection Time: 08/12/22 12:39 PM  Result Value Ref Range   Sodium 139 135 - 145 mmol/L   Potassium 3.7 3.5 - 5.1 mmol/L   Chloride 103 98 - 111 mmol/L   CO2 26 22 - 32 mmol/L   Glucose, Bld 117 (H) 70 - 99 mg/dL    Comment: Glucose reference range applies only to samples taken after fasting for at least 8 hours.   BUN 11 6 - 20 mg/dL   Creatinine, Ser 3.61 0.61 - 1.24 mg/dL   Calcium 9.2 8.9 - 44.3 mg/dL   GFR, Estimated >15 >40 mL/min    Comment: (NOTE) Calculated using the CKD-EPI Creatinine Equation (2021)    Anion gap 10 5 - 15    Comment: Performed at [facility], [address], [place], [place] [address]  Phosphorus     Status: None  Collection Time: 08/12/22 12:39 PM  Result Value Ref Range   Phosphorus 3.5 2.5 - 4.6 mg/dL    Comment: Performed at [facility], [place] [address], [place], [place] [address]  Magnesium     Status: None   Collection Time: 08/12/22 12:39 PM  Result Value Ref Range   Magnesium 2.2 1.7 - 2.4 mg/dL    Comment: Performed at [facility], [place] [address] [place], [place] [address]  CBG monitoring, ED     Status: Abnormal   Collection Time: 08/12/22  3:46 PM  Result Value Ref Range   Glucose-Capillary 391 (H) 70 - 99 mg/dL    Comment: Glucose reference range applies only to samples taken after fasting for at least 8 hours.  I-Stat venous blood gas, ([facility], [facility], [facility])     Status: Abnormal   Collection Time: 08/12/22  4:51 PM  Result Value Ref Range   pH, Ven 7.489 (H) 7.25 - 7.43   pCO2, Ven 37.2 (L) 44 - 60 mmHg   pO2, Ven 84 (H) 32 - 45 mmHg   Bicarbonate 28.3 (H) 20.0 - 28.0 mmol/L   TCO2 29 22 - 32 mmol/L   O2  Saturation 97 %   Acid-Base Excess 5.0 (H) 0.0 - 2.0 mmol/L   Sodium 134 (L) 135 - 145 mmol/L   Potassium 4.7 3.5 - 5.1 mmol/L   Calcium, Ion 1.09 (L) 1.15 - 1.40 mmol/L   HCT 41.0 39.0 - 52.0 %   Hemoglobin 13.9 13.0 - 17.0 g/dL   Sample type VENOUS   CBG monitoring, ED     Status: Abnormal   Collection Time: 08/12/22  4:51 PM  Result Value Ref Range   Glucose-Capillary 373 (H) 70 - 99 mg/dL    Comment: Glucose reference range applies only to samples taken after fasting for at least 8 hours.  CBG monitoring, ED     Status: Abnormal   Collection Time: 08/12/22  8:42 PM  Result Value Ref Range   Glucose-Capillary 64 (L) 70 - 99 mg/dL    Comment: Glucose reference range applies only to samples taken after fasting for at least 8 hours.  CBG monitoring, ED     Status: None   Collection Time: 08/12/22  9:07 PM  Result Value Ref Range   Glucose-Capillary 91 70 - 99 mg/dL    Comment: Glucose reference range applies only to samples taken after fasting for at least 8 hours.  CBG monitoring, ED     Status: Abnormal   Collection Time: 08/12/22  9:49 PM  Result Value Ref Range   Glucose-Capillary 171 (H) 70 - 99 mg/dL    Comment: Glucose reference range applies only to samples taken after fasting for at least 8 hours.  CBG monitoring, ED     Status: Abnormal   Collection Time: 08/12/22 11:23 PM  Result Value Ref Range   Glucose-Capillary 175 (H) 70 - 99 mg/dL    Comment: Glucose reference range applies only to samples taken after fasting for at least 8 hours.  Basic metabolic panel     Status: Abnormal   Collection Time: 08/12/22 11:25 PM  Result Value Ref Range   Sodium 133 (L) 135 - 145 mmol/L   Potassium 4.1 3.5 - 5.1 mmol/L   Chloride 100 98 - 111 mmol/L   CO2 22 22 - 32 mmol/L   Glucose, Bld 163 (H) 70 - 99 mg/dL    Comment: Glucose reference range applies only to samples  taken after fasting for at least 8 hours.   BUN 20 6 - 20 mg/dL   Creatinine, Ser 6.50 0.61 - 1.24 mg/dL    Calcium 8.7 (L) 8.9 - 10.3 mg/dL   GFR, Estimated >35 >46 mL/min    Comment: (NOTE) Calculated using the CKD-EPI Creatinine Equation (2021)    Anion gap 11 5 - 15    Comment: Performed at [facility], [address] [place], [place] [address]  CBG monitoring, ED     Status: Abnormal   Collection Time: 08/13/22  2:32 AM  Result Value Ref Range   Glucose-Capillary 117 (H) 70 - 99 mg/dL    Comment: Glucose reference range applies only to samples taken after fasting for at least 8 hours.  CBG monitoring, ED     Status: Abnormal   Collection Time: 08/13/22  5:44 AM  Result Value Ref Range   Glucose-Capillary 259 (H) 70 - 99 mg/dL    Comment: Glucose reference range applies only to samples taken after fasting for at least 8 hours.   Comment 1 Notify RN    Comment 2 Document in Chart   CBG monitoring, ED     Status: Abnormal   Collection Time: 08/13/22  8:04 AM  Result Value Ref Range   Glucose-Capillary 286 (H) 70 - 99 mg/dL    Comment: Glucose reference range applies only to samples taken after fasting for at least 8 hours.  CBG monitoring, ED     Status: Abnormal   Collection Time: 08/13/22 11:17 AM  Result Value Ref Range   Glucose-Capillary 256 (H) 70 - 99 mg/dL    Comment: Glucose reference range applies only to samples taken after fasting for at least 8 hours.   No results found.  Pending Labs [employer] (From admission, onward)     Start     Ordered   08/12/22 1239  Basic metabolic panel  (Hyperglycemic Hyperosmolar State (HHS))  STAT Now then every 4 hours ,   R (with STAT occurrences)      08/12/22 1212            Vitals/Pain Today's Vitals   08/13/22 0543 08/13/22 0700 08/13/22 0922 08/13/22 1000  BP:  (!) 147/90 116/82 (!) 142/84  Pulse:  92 93 87  Resp:   12 20  Temp: 98.5 F (36.9 C) 98.4 F (36.9 C) 98.6 F (37 C)   TempSrc: Oral Oral Oral   SpO2:  94% 97% 98%  Weight:      Height:      PainSc:        Isolation Precautions No active  isolations  Medications Medications  enoxaparin (LOVENOX) injection 40 mg (40 mg Subcutaneous Patient Refused/Not Given 08/13/22 0927)  dextrose 50 % solution 0-50 mL (has no administration in time range)  ondansetron (ZOFRAN) injection 4 mg (has no administration in time range)  acetaminophen (TYLENOL) tablet 650 mg (has no administration in time range)  insulin glargine-yfgn (SEMGLEE) injection 28 Units (has no administration in time range)  insulin aspart (novoLOG) injection 0-15 Units (8 Units Subcutaneous Given 08/13/22 1129)  lisinopril (ZESTRIL) tablet 10 mg (10 mg Oral Given 08/13/22 0927)  insulin aspart (novoLOG) injection 10 Units (has no administration in time range)  sodium chloride 0.9 % bolus 1,000 mL (0 mLs Intravenous Stopped 08/12/22 0939)  sodium chloride 0.9 % bolus 1,000 mL (0 mLs Intravenous Stopped 08/12/22 0939)  influenza vaccine adjuvanted (FLUAD) injection 0.5 mL (0.5 mLs Intramuscular Given 08/13/22 0928)  Mobility walks Low fall risk   Focused Assessments Cardiac Assessment Handoff:    Lab Results  Component Value Date   CKTOTAL 270 (H) 06/05/2008   CKMB 1.3 06/05/2008   TROPONINI 0.01        NO INDICATION OF MYOCARDIAL INJURY. 06/05/2008   Lab Results  Component Value Date   DDIMER 0.28 04/30/2022   Does the Patient currently have chest pain? No   , Neuro Assessment Handoff:  Swallow screen pass? Yes          Neuro Assessment: Within Defined Limits  R Recommendations: See Admitting Provider Note  Report given to:   Additional Notes:  Pt does not care about eating a carb diet. He only wants peanut butter crackers and does not eat the meal trays.

## 2022-08-13 NOTE — TOC Benefit Eligibility Note (Signed)
Patient Teacher, English as a foreign language completed.    The patient is currently admitted and upon discharge could be taking Basaglar Pen.  The current 30 day co-pay is $308.21 due to a $8,500.00 deductible.   The patient is currently admitted and upon discharge could be taking Lantus Pen.  The current 30 day co-pay is $106.11 due to a $8,500.00 deductible.   The patient is currently admitted and upon discharge could be taking Humalog Pen.  The current 30 day co-pay is $173.55 due to a $8,500.00 deductible.   The patient is currently admitted and upon discharge could be taking Ketoconazole Shampoo.  The current 30 day co-pay is $10.61.   The patient is currently admitted and upon discharge could be taking Novolog Pen  Non Formulary   The patient is currently admitted and upon discharge could be taking Toujeo Pen  Non Formulary   The patient is currently admitted and upon discharge could be taking Semglee Pen  Non Formulary   The patient is insured through [place], [place] Patient [place] Patient Advocate Team Direct Number: [phone]  Fax: [phone]

## 2022-08-13 NOTE — Progress Notes (Addendum)
HD#1 Subjective:   Summary: [name] [name] is a 37 y.o. person living with a history of uncontrolled type 1 DM recently released from jail who presented with nausea, vomiting, urinary frequency and admitted for hyperosmolar hyperglycemic state.  Overnight Events: None   Pt is doing well this morning without return of his symptoms. He did have a mild symptomatic hypoglycemic episode last night. He is eating well.   Objective:  Vital signs in last 24 hours: Vitals:   08/13/22 0543 08/13/22 0700 08/13/22 0922 08/13/22 1000  BP:  (!) 147/90 116/82 (!) 142/84  Pulse:  92 93 87  Resp:   12 20  Temp: 98.5 F (36.9 C) 98.4 F (36.9 C) 98.6 F (37 C)   TempSrc: Oral Oral Oral   SpO2:  94% 97% 98%  Weight:      Height:       Supplemental O2: Room Air SpO2: 98 %   Physical Exam:  Constitutional: well-appearing male sitting in bed, in no acute distress Cardiovascular: regular rate and rhythm Pulmonary/Chest: normal work of breathing on room air, lungs clear to auscultation bilaterally Abdominal: soft, non-tender, non-distended MSK: normal bulk and tone Neurological: alert & oriented x 3, No focal deficits Skin: warm and dry  Filed Weights   08/12/22 0302  Weight: 95.7 kg     Intake/Output Summary (Last 24 hours) at 08/13/2022 1419 Last data filed at 08/12/2022 1638 Gross per 24 hour  Intake 614.83 ml  Output --  Net 614.83 ml   Net IO Since Admission: 614.83 mL [08/13/22 1419]  Pertinent Labs:    Latest Ref Rng & Units 08/12/2022    4:51 PM 08/12/2022    3:12 AM 06/27/2022    1:11 AM  CBC  WBC 4.0 - 10.5 K/uL  5.3    Hemoglobin 13.0 - 17.0 g/dL 13.9  15.0  13.9   Hematocrit 39.0 - 52.0 % 41.0  42.0  41.0   Platelets 150 - 400 K/uL  232         Latest Ref Rng & Units 08/12/2022   11:25 PM 08/12/2022    4:51 PM 08/12/2022   12:39 PM  CMP  Glucose 70 - 99 mg/dL 163   117   BUN 6 - 20 mg/dL 20   11   Creatinine 0.61 - 1.24 mg/dL 1.12   0.94   Sodium 135 - 145  mmol/L 133  134  139   Potassium 3.5 - 5.1 mmol/L 4.1  4.7  3.7   Chloride 98 - 111 mmol/L 100   103   CO2 22 - 32 mmol/L 22   26   Calcium 8.9 - 10.3 mg/dL 8.7   9.2     Imaging: No results found.  Assessment/Plan:   Active Problems:   Hyperosmolar hyperglycemic state (HHS) ([place])   Patient Summary: [name] [name] is a 37 y.o. person living with a history of uncontrolled type 1 DM recently released from jail who presented with nausea, vomiting, urinary frequency and admitted for hyperosmolar hyperglycemic state.   Hyperosmolar hyperglycemic state Hx of uncontrolled type 1 diabetes Patient transitioned off Endotool at 4 PM yesterday and has done well since then.  His blood sugars have been difficult to control with a low overnight at 65 that was mildly symptomatic and his fasting sugar has come back up to about 260 this morning.  A1c 11.8.  He had not been on his insulin prior to coming to hospital and with his current  insurance his co-pays for his insulins are very high.  He is also reportedly homeless and states that he does not have a place to go if we discharged today.  Due to these and the fact that his blood sugars difficult control we will continue to titrate his insulin and send a TOC consult.  He also does not have a PCP so we will work on that.  We initially transitioned him to [facility] 28 units daily and NovoLog 7 units with meals alongside SSI. -Semglee 40 units daily, NovoLog 10 units TID with meals, SSI - We will continue to titrate insulins   Hyponatremia-resolved Presented with sodium of 129 which corrected with his hyperglycemia was 138.  This quickly corrected to 139 with IV hydration.  Sodium of 133 today. -We will continue to monitor   Non-oliguric acute kidney injury Presented with Cr of 1.3, normalized on repeat BMP. Likely pre-renal etiology with HHS. If renal function worsens can further work up with UA and renal US.  -daily BMP   Hypertension Patient has  history of hypertension and again is hypertensive after acute presentation.  He has been on lisinopril 10 mg daily and amlodipine 10 mg daily in the past.  He states he was last on these about 1 to 2 months ago. 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 Denies chest pain or shortness of breath. Do not suspect ACS as precipitating factor for his presentation.  -outpatient follow up with PCP   Seborrheic dermatitis of the scalp Patient endorses "dandruff" since he had diabetes. HIV antibody negative 3 months ago. -continue to treat outpatient, prescribe ketoconazole 2% shampoo at discharge.  Diet: Carb-Modified IVF: None, VTE: Enoxaparin Code: Full PT/OT recs: None, .   Dispo: Anticipated discharge to Home in 1 days pending continued stability and TOC evaluation.   [name], DO Internal Medicine Resident PGY-1 Pager: [phone]  Please contact the on call pager after 5 pm and on weekends at [phone].

## 2022-08-13 NOTE — ED Notes (Signed)
ED TO INPATIENT HANDOFF REPORT  ED Nurse Name and Phone #: Norman Clay [phone]  S Name/Age/Gender [name] [name] 37 y.o. male Room/Bed: 013C/013C  Code Status   Code Status: Full Code  Home/SNF/Other Home Patient oriented to: self, place, time, and situation Is this baseline? Yes   Triage Complete: Triage complete  Chief Complaint Hyperosmolar hyperglycemic state (HHS) ([place]) [E11.00]  Triage Note Pt arrives with c/o hyperglycemia due to not having his insulin. Per pt, last time he checked his blood sugar it was in the 700s. Pt endorse polyuria and increased thirst.    Allergies No Known Allergies  Level of Care/Admitting Diagnosis ED Disposition     ED Disposition  Admit   Condition  --   Comment  Hospital Area: [facility] [id number]  Level of Care: Med-Surg [16]  May place patient in observation at [facility] or [place] if equivalent level of care is available:: Yes  Covid Evaluation: Asymptomatic - no recent exposure (last 10 days) testing not required  Diagnosis: Hyperosmolar hyperglycemic state (HHS) [facility]) [id number]  Admitting Physician: Campbell Riches [id number]  Attending Physician: HATCHER, JEFFREY C [id number]          B Medical/Surgery History Past Medical History:  Diagnosis Date   Diabetes mellitus without complication ([place])    DKA (diabetic ketoacidosis) ([place]) 05/08/2022   Hyperosmolar hyperglycemic state (HHS) ([place]) 05/08/2022   Hypertension    Hypoglycemia 07/02/2019   Pseudohyponatremia 05/08/2022   Past Surgical History:  Procedure Laterality Date   HAND SURGERY       A IV Location/Drains/Wounds Patient Lines/Drains/Airways Status     Active Line/Drains/Airways     Name Placement date Placement time Site Days   Peripheral IV 08/12/22 22 G Anterior;Right Forearm 08/12/22  0638  Forearm  1   Peripheral IV 08/12/22 22 G 1.75" Anterior;Right Forearm 08/12/22  0812  Forearm  1            Intake/Output  Last 24 hours  Intake/Output Summary (Last 24 hours) at 08/13/2022 1545 Last data filed at 08/12/2022 1638 Gross per 24 hour  Intake 239.83 ml  Output --  Net 239.83 ml    Labs/Imaging Results for orders placed or performed during the hospital encounter of 08/12/22 (from the past 48 hour(s))  CBG monitoring, ED     Status: Abnormal   Collection Time: 08/12/22  3:02 AM  Result Value Ref Range   Glucose-Capillary >600 (HH) 70 - 99 mg/dL    Comment: Glucose reference range applies only to samples taken after fasting for at least 8 hours.  CBC     Status: None   Collection Time: 08/12/22  3:12 AM  Result Value Ref Range   WBC 5.3 4.0 - 10.5 K/uL   RBC 5.07 4.22 - 5.81 MIL/uL   Hemoglobin 15.0 13.0 - 17.0 g/dL   HCT 42.0 39.0 - 52.0 %   MCV 82.8 80.0 - 100.0 fL   MCH 29.6 26.0 - 34.0 pg   MCHC 35.7 30.0 - 36.0 g/dL   RDW 12.1 11.5 - 15.5 %   Platelets 232 150 - 400 K/uL   nRBC 0.0 0.0 - 0.2 %    Comment: Performed at [facility], [place] [address] [place], [place] [address]  Hemoglobin A1c     Status: Abnormal   Collection Time: 08/12/22  3:12 AM  Result Value Ref Range   Hgb A1c MFr Bld 11.8 (H) 4.8 - 5.6 %  Comment: (NOTE)         Prediabetes: 5.7 - 6.4         Diabetes: >6.4         Glycemic control for adults with diabetes: <7.0    Mean Plasma Glucose 292 mg/dL    Comment: (NOTE) Performed At: [facility] [place], [place] [id number] Rush Farmer MD [id number]   Comprehensive metabolic panel     Status: Abnormal   Collection Time: 08/12/22  3:36 AM  Result Value Ref Range   Sodium 129 (L) 135 - 145 mmol/L   Potassium 4.7 3.5 - 5.1 mmol/L    Comment: HEMOLYSIS AT THIS LEVEL MAY AFFECT RESULT   Chloride 91 (L) 98 - 111 mmol/L   CO2 26 22 - 32 mmol/L   Glucose, Bld 685 (HH) 70 - 99 mg/dL    Comment: CRITICAL RESULT CALLED TO, READ BACK BY AND VERIFIED WITH K.MOON,RN. [id number] 08/12/22. LPAIT Glucose reference range applies only to  samples taken after fasting for at least 8 hours.    BUN 16 6 - 20 mg/dL   Creatinine, Ser 1.31 (H) 0.61 - 1.24 mg/dL   Calcium 9.7 8.9 - 10.3 mg/dL   Total Protein 7.4 6.5 - 8.1 g/dL   Albumin 4.3 3.5 - 5.0 g/dL   AST 17 15 - 41 U/L    Comment: HEMOLYSIS AT THIS LEVEL MAY AFFECT RESULT   ALT 19 0 - 44 U/L    Comment: HEMOLYSIS AT THIS LEVEL MAY AFFECT RESULT   Alkaline Phosphatase 77 38 - 126 U/L   Total Bilirubin 0.7 0.3 - 1.2 mg/dL    Comment: HEMOLYSIS AT THIS LEVEL MAY AFFECT RESULT   GFR, Estimated >60 >60 mL/min    Comment: (NOTE) Calculated using the CKD-EPI Creatinine Equation (2021)    Anion gap 12 5 - 15    Comment: Performed at [facility], [place] [address] [place], [place] [address]  Urinalysis, Routine w reflex microscopic Urine, Clean Catch     Status: Abnormal   Collection Time: 08/12/22  3:49 AM  Result Value Ref Range   Color, Urine STRAW (A) YELLOW   APPearance CLEAR CLEAR   Specific Gravity, Urine 1.027 1.005 - 1.030   pH 6.0 5.0 - 8.0   Glucose, UA >=500 (A) NEGATIVE mg/dL   Hgb urine dipstick NEGATIVE NEGATIVE   Bilirubin Urine NEGATIVE NEGATIVE   Ketones, ur NEGATIVE NEGATIVE mg/dL   Protein, ur NEGATIVE NEGATIVE mg/dL   Nitrite NEGATIVE NEGATIVE   Leukocytes,Ua NEGATIVE NEGATIVE   RBC / HPF 0-5 0 - 5 RBC/hpf   WBC, UA 0-5 0 - 5 WBC/hpf   Bacteria, UA NONE SEEN NONE SEEN   Squamous Epithelial / HPF 0-5 0 - 5 /HPF    Comment: Performed at [facility], [place] [address] [place], [place] [address]  CBG monitoring, ED     Status: Abnormal   Collection Time: 08/12/22  6:23 AM  Result Value Ref Range   Glucose-Capillary 538 (HH) 70 - 99 mg/dL    Comment: Glucose reference range applies only to samples taken after fasting for at least 8 hours.   Comment 1 Notify RN   Beta-hydroxybutyric acid     Status: Abnormal   Collection Time: 08/12/22  7:30 AM  Result Value Ref Range   Beta-Hydroxybutyric Acid 0.91 (H) 0.05 - 0.27 mmol/L    Comment:  Performed at [place] [address] [place], [place] [address]  CBG monitoring, ED     Status: Abnormal   Collection Time: 08/12/22  8:26 AM  Result Value Ref Range   Glucose-Capillary 487 (H) 70 - 99 mg/dL    Comment: Glucose reference range applies only to samples taken after fasting for at least 8 hours.  Basic metabolic panel     Status: Abnormal   Collection Time: 08/12/22  9:14 AM  Result Value Ref Range   Sodium 133 (L) 135 - 145 mmol/L   Potassium 4.1 3.5 - 5.1 mmol/L   Chloride 100 98 - 111 mmol/L   CO2 24 22 - 32 mmol/L   Glucose, Bld 397 (H) 70 - 99 mg/dL    Comment: Glucose reference range applies only to samples taken after fasting for at least 8 hours.   BUN 13 6 - 20 mg/dL   Creatinine, Ser 6.60 0.61 - 1.24 mg/dL   Calcium 9.2 8.9 - 63.0 mg/dL   GFR, Estimated >16 >01 mL/min    Comment: (NOTE) Calculated using the CKD-EPI Creatinine Equation (2021)    Anion gap 9 5 - 15    Comment: Performed at [facility], [address] [place], [place] [address]  Osmolality     Status: Abnormal   Collection Time: 08/12/22  9:14 AM  Result Value Ref Range   Osmolality 304 (H) 275 - 295 mOsm/kg    Comment: REPEATED TO VERIFY Performed at [facility], [address] [place], [place] [address]   CBG monitoring, ED     Status: Abnormal   Collection Time: 08/12/22  9:44 AM  Result Value Ref Range   Glucose-Capillary 341 (H) 70 - 99 mg/dL    Comment: Glucose reference range applies only to samples taken after fasting for at least 8 hours.  CBG monitoring, ED     Status: Abnormal   Collection Time: 08/12/22 11:16 AM  Result Value Ref Range   Glucose-Capillary 153 (H) 70 - 99 mg/dL    Comment: Glucose reference range applies only to samples taken after fasting for at least 8 hours.  CBG monitoring, ED     Status: Abnormal   Collection Time: 08/12/22 12:15 PM  Result Value Ref Range   Glucose-Capillary 172 (H) 70 - 99 mg/dL    Comment: Glucose  reference range applies only to samples taken after fasting for at least 8 hours.  Basic metabolic panel     Status: Abnormal   Collection Time: 08/12/22 12:39 PM  Result Value Ref Range   Sodium 139 135 - 145 mmol/L   Potassium 3.7 3.5 - 5.1 mmol/L   Chloride 103 98 - 111 mmol/L   CO2 26 22 - 32 mmol/L   Glucose, Bld 117 (H) 70 - 99 mg/dL    Comment: Glucose reference range applies only to samples taken after fasting for at least 8 hours.   BUN 11 6 - 20 mg/dL   Creatinine, Ser 2.02 0.61 - 1.24 mg/dL   Calcium 9.2 8.9 - 54.2 mg/dL   GFR, Estimated >70 >62 mL/min    Comment: (NOTE) Calculated using the CKD-EPI Creatinine Equation (2021)    Anion gap 10 5 - 15    Comment: Performed at [facility], [address] [place], [place] [address]  Phosphorus     Status: None   Collection Time: 08/12/22 12:39 PM  Result Value Ref Range   Phosphorus 3.5 2.5 - 4.6 mg/dL    Comment: Performed at [facility], [address], [place], [place] [address]  Magnesium     Status: None   Collection Time: 08/12/22 12:39 PM  Result Value Ref Range   Magnesium 2.2 1.7 - 2.4 mg/dL    Comment: Performed at [facility], [place] [address], [place], [place] [address]  CBG monitoring, ED     Status: Abnormal   Collection Time: 08/12/22  3:46 PM  Result Value Ref Range   Glucose-Capillary 391 (H) 70 - 99 mg/dL    Comment: Glucose reference range applies only to samples taken after fasting for at least 8 hours.  I-Stat venous blood gas, ([facility], [facility], [facility])     Status: Abnormal   Collection Time: 08/12/22  4:51 PM  Result Value Ref Range   pH, Ven 7.489 (H) 7.25 - 7.43   pCO2, Ven 37.2 (L) 44 - 60 mmHg   pO2, Ven 84 (H) 32 - 45 mmHg   Bicarbonate 28.3 (H) 20.0 - 28.0 mmol/L   TCO2 29 22 - 32 mmol/L   O2 Saturation 97 %   Acid-Base Excess 5.0 (H) 0.0 - 2.0 mmol/L   Sodium 134 (L) 135 - 145 mmol/L   Potassium 4.7 3.5 - 5.1 mmol/L   Calcium, Ion 1.09 (L) 1.15 - 1.40 mmol/L   HCT 41.0 39.0  - 52.0 %   Hemoglobin 13.9 13.0 - 17.0 g/dL   Sample type VENOUS   CBG monitoring, ED     Status: Abnormal   Collection Time: 08/12/22  4:51 PM  Result Value Ref Range   Glucose-Capillary 373 (H) 70 - 99 mg/dL    Comment: Glucose reference range applies only to samples taken after fasting for at least 8 hours.  CBG monitoring, ED     Status: Abnormal   Collection Time: 08/12/22  8:42 PM  Result Value Ref Range   Glucose-Capillary 64 (L) 70 - 99 mg/dL    Comment: Glucose reference range applies only to samples taken after fasting for at least 8 hours.  CBG monitoring, ED     Status: None   Collection Time: 08/12/22  9:07 PM  Result Value Ref Range   Glucose-Capillary 91 70 - 99 mg/dL    Comment: Glucose reference range applies only to samples taken after fasting for at least 8 hours.  CBG monitoring, ED     Status: Abnormal   Collection Time: 08/12/22  9:49 PM  Result Value Ref Range   Glucose-Capillary 171 (H) 70 - 99 mg/dL    Comment: Glucose reference range applies only to samples taken after fasting for at least 8 hours.  CBG monitoring, ED     Status: Abnormal   Collection Time: 08/12/22 11:23 PM  Result Value Ref Range   Glucose-Capillary 175 (H) 70 - 99 mg/dL    Comment: Glucose reference range applies only to samples taken after fasting for at least 8 hours.  Basic metabolic panel     Status: Abnormal   Collection Time: 08/12/22 11:25 PM  Result Value Ref Range   Sodium 133 (L) 135 - 145 mmol/L   Potassium 4.1 3.5 - 5.1 mmol/L   Chloride 100 98 - 111 mmol/L   CO2 22 22 - 32 mmol/L   Glucose, Bld 163 (H) 70 - 99 mg/dL    Comment: Glucose reference range applies only to samples taken after fasting for at least 8 hours.   BUN 20 6 - 20 mg/dL   Creatinine, Ser 1.12 0.61 - 1.24 mg/dL   Calcium 8.7 (L)  8.9 - 10.3 mg/dL   GFR, Estimated >60 >60 mL/min    Comment: (NOTE) Calculated using the CKD-EPI Creatinine Equation (2021)    Anion gap 11 5 - 15    Comment: Performed at  [facility], [place] [address], [place], [place] [address]  CBG monitoring, ED     Status: Abnormal   Collection Time: 08/13/22  2:32 AM  Result Value Ref Range   Glucose-Capillary 117 (H) 70 - 99 mg/dL    Comment: Glucose reference range applies only to samples taken after fasting for at least 8 hours.  CBG monitoring, ED     Status: Abnormal   Collection Time: 08/13/22  5:44 AM  Result Value Ref Range   Glucose-Capillary 259 (H) 70 - 99 mg/dL    Comment: Glucose reference range applies only to samples taken after fasting for at least 8 hours.   Comment 1 Notify RN    Comment 2 Document in Chart   CBG monitoring, ED     Status: Abnormal   Collection Time: 08/13/22  8:04 AM  Result Value Ref Range   Glucose-Capillary 286 (H) 70 - 99 mg/dL    Comment: Glucose reference range applies only to samples taken after fasting for at least 8 hours.  CBG monitoring, ED     Status: Abnormal   Collection Time: 08/13/22 11:17 AM  Result Value Ref Range   Glucose-Capillary 256 (H) 70 - 99 mg/dL    Comment: Glucose reference range applies only to samples taken after fasting for at least 8 hours.   No results found.  Pending Labs Unresulted Labs (From admission, onward)     Start     Ordered   08/14/22 [id number]  Basic metabolic panel  Tomorrow morning,   R        08/13/22 1436   08/14/22 0500  CBC  Tomorrow morning,   R        08/13/22 1436            Vitals/Pain Today's Vitals   08/13/22 0700 08/13/22 0922 08/13/22 1000 08/13/22 1442  BP: (!) 147/90 116/82 (!) 142/84 138/89  Pulse: 92 93 87 92  Resp:  12 20 17   Temp: 98.4 F (36.9 C) 98.6 F (37 C)  98.1 F (36.7 C)  TempSrc: Oral Oral  Oral  SpO2: 94% 97% 98% 98%  Weight:      Height:      PainSc:        Isolation Precautions No active isolations  Medications Medications  enoxaparin (LOVENOX) injection 40 mg (40 mg Subcutaneous Patient Refused/Not Given 08/13/22 0927)  dextrose 50 % solution 0-50 mL (has no  administration in time range)  ondansetron (ZOFRAN) injection 4 mg (has no administration in time range)  acetaminophen (TYLENOL) tablet 650 mg (has no administration in time range)  insulin aspart (novoLOG) injection 0-15 Units (8 Units Subcutaneous Given 08/13/22 1129)  lisinopril (ZESTRIL) tablet 10 mg (10 mg Oral Given 08/13/22 0927)  insulin aspart (novoLOG) injection 10 Units (has no administration in time range)  insulin glargine-yfgn (SEMGLEE) injection 40 Units (has no administration in time range)  sodium chloride 0.9 % bolus 1,000 mL (0 mLs Intravenous Stopped 08/12/22 0939)  sodium chloride 0.9 % bolus 1,000 mL (0 mLs Intravenous Stopped 08/12/22 0939)  influenza vaccine adjuvanted (FLUAD) injection 0.5 mL (0.5 mLs Intramuscular Given 08/13/22 [id number])    Mobility walks Low fall risk   Focused Assessments Neuro Assessment Handoff:  Swallow screen pass? Yes  Neuro Assessment: Within Defined Limits Neuro Checks:      Has TPA been given? No If patient is a Neuro Trauma and patient is going to OR before floor call report to [place] nurse: [phone] or [phone]   R Recommendations: See Admitting Provider Note  Report given to:   Additional Notes:  patient alert and oriented ambulatory and able to use call bell and restroom without assistance

## 2022-08-14 ENCOUNTER — Other Ambulatory Visit (HOSPITAL_COMMUNITY): Payer: Self-pay

## 2022-08-14 DIAGNOSIS — E1100 Type 2 diabetes mellitus with hyperosmolarity without nonketotic hyperglycemic-hyperosmolar coma (NKHHC): Secondary | ICD-10-CM | POA: Diagnosis not present

## 2022-08-14 DIAGNOSIS — Z794 Long term (current) use of insulin: Secondary | ICD-10-CM | POA: Diagnosis not present

## 2022-08-14 DIAGNOSIS — E860 Dehydration: Secondary | ICD-10-CM | POA: Diagnosis not present

## 2022-08-14 LAB — CBC
HCT: 43.1 % (ref 39.0–52.0)
Hemoglobin: 14.7 g/dL (ref 13.0–17.0)
MCH: 28.5 pg (ref 26.0–34.0)
MCHC: 34.1 g/dL (ref 30.0–36.0)
MCV: 83.5 fL (ref 80.0–100.0)
Platelets: 210 10*3/uL (ref 150–400)
RBC: 5.16 MIL/uL (ref 4.22–5.81)
RDW: 12.4 % (ref 11.5–15.5)
WBC: 4.6 10*3/uL (ref 4.0–10.5)
nRBC: 0 % (ref 0.0–0.2)

## 2022-08-14 LAB — BASIC METABOLIC PANEL
Anion gap: 7 (ref 5–15)
BUN: 19 mg/dL (ref 6–20)
CO2: 24 mmol/L (ref 22–32)
Calcium: 8.6 mg/dL — ABNORMAL LOW (ref 8.9–10.3)
Chloride: 103 mmol/L (ref 98–111)
Creatinine, Ser: 1 mg/dL (ref 0.61–1.24)
GFR, Estimated: >60 mL/min (ref >60)
Glucose, Bld: 194 mg/dL — ABNORMAL HIGH (ref 70–99)
Potassium: 3.8 mmol/L (ref 3.5–5.1)
Sodium: 134 mmol/L — ABNORMAL LOW (ref 135–145)

## 2022-08-14 LAB — GLUCOSE, CAPILLARY
Glucose-Capillary: 147 mg/dL — ABNORMAL HIGH (ref 70–99)
Glucose-Capillary: 172 mg/dL — ABNORMAL HIGH (ref 70–99)
Glucose-Capillary: 283 mg/dL — ABNORMAL HIGH (ref 70–99)
Glucose-Capillary: 374 mg/dL — ABNORMAL HIGH (ref 70–99)

## 2022-08-14 MED ORDER — AMLODIPINE BESYLATE 10 MG PO TABS
10.0000 mg | ORAL_TABLET | Freq: Every day | ORAL | 1 refills | Status: DC
  Filled 2022-08-14: qty 90, 90d supply, fill #0

## 2022-08-14 MED ORDER — AMLODIPINE BESYLATE 10 MG PO TABS
10.0000 mg | ORAL_TABLET | Freq: Every day | ORAL | Status: DC
  Administered 2022-08-14: 10 mg via ORAL
  Filled 2022-08-14: qty 1

## 2022-08-14 MED ORDER — BLOOD GLUCOSE MONITOR KIT
PACK | 0 refills | Status: DC
  Filled 2022-08-14: qty 1, 30d supply, fill #0

## 2022-08-14 MED ORDER — LISINOPRIL 10 MG PO TABS
10.0000 mg | ORAL_TABLET | Freq: Every day | ORAL | 1 refills | Status: DC
  Filled 2022-08-14: qty 90, 90d supply, fill #0

## 2022-08-14 MED ORDER — LANCETS MISC
1.0000 | Freq: Four times a day (QID) | 1 refills | Status: DC
  Filled 2022-08-14: qty 100, 25d supply, fill #0

## 2022-08-14 MED ORDER — INSULIN LISPRO (1 UNIT DIAL) 100 UNIT/ML (KWIKPEN)
PEN_INJECTOR | SUBCUTANEOUS | 1 refills | Status: DC
  Filled 2022-08-14: qty 15, 20d supply, fill #0

## 2022-08-14 MED ORDER — INSULIN GLARGINE 100 UNIT/ML SOLOSTAR PEN
40.0000 [IU] | PEN_INJECTOR | Freq: Every day | SUBCUTANEOUS | 1 refills | Status: DC
  Filled 2022-08-14: qty 12, 30d supply, fill #0

## 2022-08-14 MED ORDER — LISINOPRIL 10 MG PO TABS: 10.0000 mg | ORAL_TABLET | Freq: Every day | ORAL | 1 refills | Status: DC

## 2022-08-14 MED ORDER — LIVING WELL WITH DIABETES BOOK
Freq: Once | Status: DC
  Filled 2022-08-14: qty 1

## 2022-08-14 MED ORDER — KETOCONAZOLE 2 % EX SHAM
MEDICATED_SHAMPOO | CUTANEOUS | Status: DC
  Filled 2022-08-14: qty 120

## 2022-08-14 MED ORDER — INSULIN PEN NEEDLE 32G X 4 MM MISC
1.0000 | Freq: Four times a day (QID) | 1 refills | Status: DC
  Filled 2022-08-14 – 2022-10-15 (×2): qty 100, 25d supply, fill #0

## 2022-08-14 MED ORDER — GLUCOSE BLOOD VI STRP
1.0000 | ORAL_STRIP | Freq: Four times a day (QID) | 1 refills | Status: DC
  Filled 2022-08-14: qty 100, 25d supply, fill #0

## 2022-08-14 MED ORDER — INSULIN ASPART 100 UNIT/ML IJ SOLN
10.0000 [IU] | Freq: Three times a day (TID) | INTRAMUSCULAR | Status: DC
  Administered 2022-08-14 (×2): 10 [IU] via SUBCUTANEOUS

## 2022-08-14 NOTE — Progress Notes (Signed)
Patient discharged. RN walked to outside. Patient refused wheelchair. Patient tolerated well.

## 2022-08-14 NOTE — Inpatient Diabetes Management (Signed)
Inpatient Diabetes Program Recommendations  AACE/ADA: New Consensus Statement on Inpatient Glycemic Control (2015)  Target Ranges:  Prepandial:   less than 140 mg/dL      Peak postprandial:   less than 180 mg/dL (1-2 hours)      Critically ill patients:  140 - 180 mg/dL   Lab Results  Component Value Date   GLUCAP 172 (H) 08/14/2022   HGBA1C 11.8 (H) 08/12/2022    Review of Glycemic Control  Latest Reference Range & Units 08/14/22 05:51 08/14/22 08:28 08/14/22 11:46  Glucose-Capillary 70 - 99 mg/dL 147 (H) 374 (H) 172 (H)   Diabetes history: DM 2 Outpatient Diabetes medications:  Novolin 70/30 28 units bid plus Regular SSI Current orders for Inpatient glycemic control:  Novolog 0-15 units tid with meals and HS Novolog 10 units tid with meals Semglee 40 units q HS  Inpatient Diabetes Program Recommendations:    Note plans for d/c.  Per MD patient will be discharged on Humalog and Lantus if he can afford using co-pay cards, etc.  Briefly visited with patient and discussed cost of 70/30 pens (42$ at [facility]) as this was what he was on prior to admit.  Messaged MD to let him know that patient is unable to get in touch with family/friends to get money in order to pick up medications.  MD states he will try to call.  TOC consult also in place.  Will order LWWD booklet for patient as well.    Thanks,  Adah Perl, RN, BC-ADM Inpatient Diabetes Coordinator Pager [phone]  (8a-5p)

## 2022-08-14 NOTE — TOC Transition Note (Signed)
Transition of Care [facility]) - CM/SW Discharge Note   Patient Details  Name: [name] [name] MRN: [id number] Date of Birth: 08/26/1985  [facility] CM/SW Contact:  Carles Collet, RN Phone Number: 08/14/2022, 1:46 PM   Clinical Narrative:     Pharmacist Benetta Spar, working on copays and coupons for DM meds. IM Dr Marlou Sa agreed to have patient seen at clinic for follow up, TOC will deliver meds to room prior to DC, patient can be billed for copay of $120, unfortunately financial assistance for insured patients is not available from the hospital.  [place] addressing homeless consult for resources.   Final next level of care: Home/Self Care Barriers to Discharge: No Barriers Identified   Patient Goals and CMS Choice      Discharge Placement                         Discharge Plan and Services Additional resources added to the After Visit Summary for                                       Social Determinants of Health (SDOH) Interventions SDOH Screenings   Food Insecurity: Food Insecurity Present (08/12/2022)  Housing: High Risk (08/12/2022)  Transportation Needs: Unmet Transportation Needs (08/12/2022)  Utilities: Not At Risk (08/12/2022)  Tobacco Use: High Risk (08/12/2022)     Readmission Risk Interventions     No data to display

## 2022-08-14 NOTE — Discharge Instructions (Addendum)
[name] [name],  You were recently admitted to [facility] for severe hyperglycemia in a hyperosmolar hyperglycemic state.  Please follow-up in our clinic located on the ground floor at [place] have an appointment scheduled for 08/21/2022 at 2:15 PM with Dr. Linwood Dibbles.  You should seek further medical care if you have similar symptoms in the future.  We recommend that you see your primary care doctor in about a week to make sure that you continue to improve. We are so glad that you are feeling better.  Sincerely, [name], DO  [phone]

## 2022-08-14 NOTE — Social Work (Signed)
CSW acknowledges flags on SDOH screen and met with pt at bedside. Pt confirms he currently is homeless and does not have any family support. CSW provided resources and counseled pt on shelter options. CSW provided free meal list and a bus pass. TOC will sign off.

## 2022-08-14 NOTE — Discharge Summary (Addendum)
Name: [name] [name] MRN: [id number] DOB: 1985/11/26 37 y.o. PCP: Pcp, No  Date of Admission: 08/12/2022  2:55 AM Date of Discharge:  08/14/2022 Attending Physician: Dr.  Johnnye Sima  DISCHARGE DIAGNOSIS:  Primary Problem: <principal problem not specified>   Hospital Problems: Active Problems:   Hyperosmolar hyperglycemic state (HHS) ([place])    DISCHARGE MEDICATIONS:   Allergies as of 08/14/2022   No Known Allergies      Medication List     STOP taking these medications    HumuLIN R 100 units/mL injection Generic drug: insulin regular   NovoLIN 70/30 Kwikpen (70-30) 100 UNIT/ML KwikPen Generic drug: insulin isophane & regular human KwikPen   NovoLOG 100 UNIT/ML injection Generic drug: insulin aspart       TAKE these medications    amLODipine 10 MG tablet Commonly known as: NORVASC Take 1 tablet (10 mg total) by mouth daily.   blood glucose meter kit and supplies Kit Dispense based on patient and insurance preference. Use up to four times daily as directed.   glucose blood test strip Use 1 each in the morning, at noon, in the evening, and at bedtime.   insulin glargine 100 UNIT/ML Solostar Pen Commonly known as: LANTUS Inject 40 Units into the skin at bedtime.   insulin lispro 100 UNIT/ML KwikPen Commonly known as: HUMALOG Give per scale - CBG 70 - 120: 0 units; CBG 121 - 150: 12 units; CBG 151 - 200: 13 units; CBG 201 - 250: 15 units;  CBG 251 - 300: 18 units; CBG 301 - 350: 21 units; CBG 351 - 400: 25 units; CBG > 400: 30 units and call MD   Insulin Pen Needle 32G X 4 MM Misc Use 1 in the morning, at noon, in the evening, and at bedtime.   Lancets Misc Use 1 each in the morning, at noon, in the evening, and at bedtime.   lisinopril 10 MG tablet Commonly known as: ZESTRIL Take 1 tablet (10 mg total) by mouth daily.        DISPOSITION AND FOLLOW-UP:  Mr.[name] [name] was discharged from [facility] in Stable condition. At the  hospital follow up visit please address:  Hyperosmolar hyperglycemic state Hx of uncontrolled type 1 diabetes Adjust insulin regimen as needed. Recommend follow up with diabetes coordinator as well.    Acute kidney injury Hyponatremia Repeat BMP.   Hypertension Adjust anti-hypertensive therapy regimen as needed.   Abnormal EKG  Consider repeat EKG and referral to cardiology.  Follow-up Recommendations: Consults: Diabetes educator Labs: Basic Metabolic Profile Studies: EKG Medications:  START taking: Lantus 40 units daily at bedtime Humalog sliding scale TID with meals CONTINUE taking: Amlodipine 10 mg daily Lisinopril 10 mg daily  Follow-up Appointments:  Follow-up Information     [facility] Follow up.   Contact information: [address] [place] COURSE:  Patient Summary: Hyperosmolar hyperglycemic state Hx of uncontrolled type 1 diabetes Patient presented with increased urinary frequency, nausea, and vomiting with hyperglycemia in the setting of not having insulin. He was lethargic and his blood sugar was >600 with a normal anion gap and bicarbonate level. Urinalysis did not have ketones and his beta-hydroxybutyric acid was minimally elevated. Osmolality was minimally elevated to 304. HbA1c was 11.8%. He was admitted and started on endotool and ultimately transitioned to both long- and short-acting insulin while  his diet was progressed. During his admission, his insulin regimen was adjusted to optimize his blood glucose levels.   Hyponatremia Hyponatremia to 129 on admission with correction to 138. Hyponatremia was normal prior to discharge.   Acute kidney injury Creatinine elevated to 1.3, thought to be pre-renal in nature. Resolved prior to discharge.   Hypertension Patient denied taking anti-hypertensives outpatient. He had hypertension throughout admission and  was restarted on amlodipine and lisinopril prior to discharge.   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 Patient was asymptomatic. Recommend follow-up with PCP.    Seborrheic dermatitis of the scalp HIV antibody negative 3 months ago. Patient given ketoconazole 2% shampoo for this concern.   DISCHARGE INSTRUCTIONS:   Discharge Instructions     Call MD for:  difficulty breathing, headache or visual disturbances   Complete by: As directed    Call MD for:  extreme fatigue   Complete by: As directed    Call MD for:  persistant dizziness or light-headedness   Complete by: As directed    Call MD for:  persistant nausea and vomiting   Complete by: As directed    Call MD for:  severe uncontrolled pain   Complete by: As directed    Call MD for:  temperature >100.4   Complete by: As directed    Diet - low sodium heart healthy   Complete by: As directed    Increase activity slowly   Complete by: As directed        SUBJECTIVE:  Patient reports feeling generally well today. He has had additional symptomatic episodes of hypoglycemic symptoms though blood glucose has been normal. He has no additional complaints at this time.  Discharge Vitals:   BP (!) 147/94 (BP Location: Left Arm)   Pulse 96   Temp 99.4 F (37.4 C) (Oral)   Resp 17   Ht 5\' 11"  (1.803 m)   Wt 95.7 kg   SpO2 99%   BMI 29.43 kg/m   OBJECTIVE:  Constitutional: well-appearing male sitting in bed, in no acute distress Cardiovascular: regular rate and rhythm Pulmonary/Chest: normal work of breathing on room air, lungs clear to auscultation bilaterally Abdominal: soft, non-tender, non-distended MSK: normal bulk and tone Neurological: alert & oriented x 3, No focal deficits Skin: warm and dry  Pertinent Labs, Studies, and Procedures:     Latest Ref Rng & Units 08/14/2022    3:48 AM 08/12/2022    4:51 PM 08/12/2022    3:12 AM  CBC  WBC 4.0 - 10.5 K/uL 4.6   5.3   Hemoglobin 13.0 - 17.0 g/dL  08/14/2022  48.5  46.2   Hematocrit 39.0 - 52.0 % 43.1  41.0  42.0   Platelets 150 - 400 K/uL 210   232        Latest Ref Rng & Units 08/14/2022    3:48 AM 08/12/2022   11:25 PM 08/12/2022    4:51 PM  CMP  Glucose 70 - 99 mg/dL 08/14/2022  500    BUN 6 - 20 mg/dL 19  20    Creatinine 938 - 1.24 mg/dL 1.82  9.93    Sodium 7.16 - 145 mmol/L 134  133  134   Potassium 3.5 - 5.1 mmol/L 3.8  4.1  4.7   Chloride 98 - 111 mmol/L 103  100    CO2 22 - 32 mmol/L 24  22    Calcium 8.9 - 10.3 mg/dL 8.6  8.7  No results found.   Signed: [name], DO Internal Medicine Resident, PGY-1 Pager# [phone]

## 2022-08-21 ENCOUNTER — Encounter: Payer: No Typology Code available for payment source | Admitting: Student

## 2022-08-26 ENCOUNTER — Emergency Department (HOSPITAL_COMMUNITY)
Admission: EM | Admit: 2022-08-26 | Discharge: 2022-08-26 | Disposition: A | Discharge status: Home / Self Care | Payer: No Typology Code available for payment source | Attending: Emergency Medicine | Admitting: Emergency Medicine

## 2022-08-26 ENCOUNTER — Emergency Department (HOSPITAL_COMMUNITY): Payer: No Typology Code available for payment source

## 2022-08-26 ENCOUNTER — Other Ambulatory Visit: Payer: Self-pay

## 2022-08-26 DIAGNOSIS — I10 Essential (primary) hypertension: Secondary | ICD-10-CM | POA: Diagnosis not present

## 2022-08-26 DIAGNOSIS — E119 Type 2 diabetes mellitus without complications: Secondary | ICD-10-CM | POA: Diagnosis not present

## 2022-08-26 DIAGNOSIS — Z794 Long term (current) use of insulin: Secondary | ICD-10-CM | POA: Diagnosis not present

## 2022-08-26 DIAGNOSIS — R079 Chest pain, unspecified: Secondary | ICD-10-CM

## 2022-08-26 DIAGNOSIS — Z79899 Other long term (current) drug therapy: Secondary | ICD-10-CM | POA: Insufficient documentation

## 2022-08-26 LAB — COMPREHENSIVE METABOLIC PANEL
ALT: 10 U/L (ref 0–44)
AST: 20 U/L (ref 15–41)
Albumin: 3.4 g/dL — ABNORMAL LOW (ref 3.5–5.0)
Alkaline Phosphatase: 75 U/L (ref 38–126)
Anion gap: 9 (ref 5–15)
BUN: 14 mg/dL (ref 6–20)
CO2: 24 mmol/L (ref 22–32)
Calcium: 9.1 mg/dL (ref 8.9–10.3)
Chloride: 102 mmol/L (ref 98–111)
Creatinine, Ser: 1.05 mg/dL (ref 0.61–1.24)
GFR, Estimated: >60 mL/min (ref >60)
Glucose, Bld: 113 mg/dL — ABNORMAL HIGH (ref 70–99)
Potassium: 3.6 mmol/L (ref 3.5–5.1)
Sodium: 135 mmol/L (ref 135–145)
Total Bilirubin: 0.7 mg/dL (ref 0.3–1.2)
Total Protein: 6.8 g/dL (ref 6.5–8.1)

## 2022-08-26 LAB — CBC WITH DIFFERENTIAL/PLATELET
Abs Immature Granulocytes: 0.02 10*3/uL (ref 0.00–0.07)
Basophils Absolute: 0 10*3/uL (ref 0.0–0.1)
Basophils Relative: 0 %
Eosinophils Absolute: 0.1 10*3/uL (ref 0.0–0.5)
Eosinophils Relative: 1 %
HCT: 42.3 % (ref 39.0–52.0)
Hemoglobin: 14.5 g/dL (ref 13.0–17.0)
Immature Granulocytes: 0 %
Lymphocytes Relative: 34 %
Lymphs Abs: 2.3 10*3/uL (ref 0.7–4.0)
MCH: 29.3 pg (ref 26.0–34.0)
MCHC: 34.3 g/dL (ref 30.0–36.0)
MCV: 85.5 fL (ref 80.0–100.0)
Monocytes Absolute: 0.9 10*3/uL (ref 0.1–1.0)
Monocytes Relative: 13 %
Neutro Abs: 3.6 10*3/uL (ref 1.7–7.7)
Neutrophils Relative %: 52 %
Platelets: 221 10*3/uL (ref 150–400)
RBC: 4.95 MIL/uL (ref 4.22–5.81)
RDW: 12.6 % (ref 11.5–15.5)
WBC: 6.9 10*3/uL (ref 4.0–10.5)
nRBC: 0 % (ref 0.0–0.2)

## 2022-08-26 LAB — TROPONIN I (HIGH SENSITIVITY)
Troponin I (High Sensitivity): 6 ng/L (ref <18)
Troponin I (High Sensitivity): 6 ng/L (ref <18)

## 2022-08-26 LAB — CBG MONITORING, ED: Glucose-Capillary: 149 mg/dL — ABNORMAL HIGH (ref 70–99)

## 2022-08-26 NOTE — Discharge Instructions (Signed)
You are seen in the ER today for your chest pain.  There is no evidence of emergent problem with your heart or lungs today.  Suspect you may have strained the muscles in your chest wall when you are lifting of boxes earlier this evening.  You may take Tylenol or ibuprofen as needed for soreness.  Follow-up with the cardiologist listed below for reevaluation for ongoing chest pain and return to the ER with any severe symptoms.

## 2022-08-26 NOTE — ED Provider Notes (Signed)
[place] EMERGENCY DEPARTMENT AT [facility] Provider Note   CSN: [id number] Arrival date & time: 08/26/22  0012     History  Chief Complaint  Patient presents with   Chest Pain    [name] [name] is a 37 y.o. male complains of central chest pressure that radiates to the right arm that started while he was helping someone move boxes this evening.  Improving when resting but not resolved.  No identified aggravating factors. No palpitations, shortness of breath that started when pain initiated now resolved.  Aspirin with EMS.  No history of heart attack.    I personally reviewed medical records.  History of hypertension, type 2 diabetes, bacteremia.  No anticoagulation. States similar episodes In the past when "feeling stressed".  HPI     Home Medications Prior to Admission medications   Medication Sig Start Date End Date Taking? Authorizing Provider  amLODipine (NORVASC) 10 MG tablet Take 1 tablet (10 mg total) by mouth daily. 08/14/22   Rocky Morel, DO  blood glucose meter kit and supplies KIT Dispense based on patient and insurance preference. Use up to four times daily as directed. 08/14/22   Rocky Morel, DO  glucose blood test strip Use 1 each in the morning, at noon, in the evening, and at bedtime. 08/14/22   Rocky Morel, DO  insulin glargine (LANTUS) 100 UNIT/ML Solostar Pen Inject 40 Units into the skin at bedtime. 08/14/22   Rocky Morel, DO  insulin lispro (HUMALOG) 100 UNIT/ML KwikPen Give per scale - CBG 70 - 120: 0 units; CBG 121 - 150: 12 units; CBG 151 - 200: 13 units; CBG 201 - 250: 15 units;  CBG 251 - 300: 18 units; CBG 301 - 350: 21 units; CBG 351 - 400: 25 units; CBG > 400: 30 units and call MD 08/14/22   Rocky Morel, DO  Insulin Pen Needle 32G X 4 MM MISC Use 1 to give insulin in the morning, at noon, in the evening, and at bedtime. 08/14/22   Rocky Morel, DO  Lancets MISC Use 1 each in the morning, at noon, in the evening, and at  bedtime. 08/14/22   Rocky Morel, DO  lisinopril (ZESTRIL) 10 MG tablet Take 1 tablet (10 mg total) by mouth daily. 08/14/22   Rocky Morel, DO      Allergies    Patient has no known allergies.    Review of Systems   Review of Systems  Constitutional: Negative.   HENT: Negative.    Respiratory:  Positive for chest tightness. Negative for shortness of breath.   Cardiovascular:  Positive for chest pain. Negative for palpitations and leg swelling.  Gastrointestinal: Negative.     Physical Exam Updated Vital Signs BP (!) 159/109 (BP Location: Right Arm)   Pulse 91   Temp 98.3 F (36.8 C)   Resp 20   SpO2 99%  Physical Exam Vitals and nursing note reviewed.  Constitutional:      Appearance: He is not ill-appearing or toxic-appearing.  HENT:     Head: Normocephalic and atraumatic.     Mouth/Throat:     Mouth: Mucous membranes are moist.     Pharynx: No oropharyngeal exudate or posterior oropharyngeal erythema.  Eyes:     General:        Right eye: No discharge.        Left eye: No discharge.     Conjunctiva/sclera: Conjunctivae normal.  Cardiovascular:     Rate and Rhythm: Normal rate and  regular rhythm.     Pulses: Normal pulses.     Heart sounds: Normal heart sounds. No murmur heard. Pulmonary:     Effort: Pulmonary effort is normal. No respiratory distress.     Breath sounds: Normal breath sounds. No wheezing or rales.  Chest:     Chest wall: Tenderness present. No mass or edema.    Abdominal:     General: Bowel sounds are normal. There is no distension.     Palpations: Abdomen is soft.     Tenderness: There is no abdominal tenderness.  Musculoskeletal:        General: No deformity.     Cervical back: Neck supple.  Skin:    General: Skin is warm and dry.     Capillary Refill: Capillary refill takes less than 2 seconds.  Neurological:     General: No focal deficit present.     Mental Status: He is alert and oriented to person, place, and time. Mental  status is at baseline.  Psychiatric:        Mood and Affect: Mood normal.     ED Results / Procedures / Treatments   Labs (all labs ordered are listed, but only abnormal results are displayed) Labs Reviewed  COMPREHENSIVE METABOLIC PANEL - Abnormal; Notable for the following components:      Result Value   Glucose, Bld 113 (*)    Albumin 3.4 (*)    All other components within normal limits  CBC WITH DIFFERENTIAL/PLATELET  CBG MONITORING, ED  TROPONIN I (HIGH SENSITIVITY)  TROPONIN I (HIGH SENSITIVITY)    EKG EKG Interpretation  Date/Time:  Monday August 26 2022 00:20:47 EST Ventricular Rate:  103 PR Interval:  124 QRS Duration: 100 QT Interval:  354 QTC Calculation: 463 R Axis:   -19 Text Interpretation: Sinus tachycardia No acute changes When compared with ECG of 12-Aug-2022 08:38, PREVIOUS ECG IS PRESENT Confirmed by Addison Lank [phone]) on 08/26/2022 5:09:41 AM  Radiology DG Chest 2 View  Result Date: 08/26/2022 CLINICAL DATA:  Chest pain EXAM: CHEST - 2 VIEW COMPARISON:  06/27/2022 FINDINGS: Lungs are clear.  No pleural effusion or pneumothorax. The heart is normal in size. Visualized osseous structures are within normal limits. IMPRESSION: Normal chest radiographs. Electronically Signed   By: Julian Hy M.D.   On: 08/26/2022 00:57    Procedures Procedures    Medications Ordered in ED Medications - No data to display  ED Course/ Medical Decision Making/ A&P             HEART Score: 1                Medical Decision Making Amount and/or Complexity of Data Reviewed Labs: ordered.    Details: CBC without leukocytosis or anemia, CMP unremarkable, troponin negative x 2. Radiology: ordered.    Details: Chest x-ray visualized this provider negative for acute cardiopulmonary disease  ECG/medicine tests:     Details: EKG with sinus tachycardia with rate of 103, otherwise unremarkable      While the exact etiology of this patient symptoms remains  unclear does not appear to be any emergent cardiopulmonary etiology at this time.  Clinical concern for emergent underlying etiology that would warrant further ED workup or inpatient management is exceedingly low.  [name] [name] voiced understanding of his medical evaluation and treatment plan. Each of their questions answered to their expressed satisfaction.  Return precautions were given.  Patient is well-appearing, stable, and was discharged in good condition.  This chart  was dictated using voice recognition software, Dragon. Despite the best efforts of this provider to proofread and correct errors, errors may still occur which can change documentation meaning.   Final Clinical Impression(s) / ED Diagnoses Final diagnoses:  Chest pain, unspecified type    Rx / DC Orders ED Discharge Orders          Ordered    Ambulatory referral to Cardiology       Comments: If you have not heard from the Cardiology office within the next 72 hours please call [phone].   08/26/22 0540              [place], Gypsy Balsam, PA-C 08/26/22 0542    Fatima Blank, MD 08/26/22 [phone]

## 2022-08-26 NOTE — ED Provider Triage Note (Signed)
Emergency Medicine Provider Triage Evaluation Note  [name] [name] , a 37 y.o. male  was evaluated in triage.  Pt complains of central chest pressure that radiates to the right arm that started while he was helping someone move boxes this evening.  Improving when resting but not resolved.  No palpitations, shortness of breath that started when pain initiated now resolved.  Aspirin with EMS.  No history of heart attack.  I personally reviewed medical records.  History of hypertension, type 2 diabetes, bacteremia.  No anticoagulation..  Review of Systems  Positive: As above Negative: As above  Physical Exam  BP (!) 151/101   Pulse (!) 105   Temp 98.6 F (37 C) (Oral)   Resp 18   SpO2 100%  Gen:   Awake, no distress   Resp:  Normal effort  MSK:   Moves extremities without difficulty  Other:  RRR no M/R/G.  Lungs CTA B. Central chest wall with TTP mildly on exam.   Medical Decision Making  Medically screening exam initiated at 1:00 AM.  Appropriate orders placed.  JERRELL MANGEL was informed that the remainder of the evaluation will be completed by another provider, this initial triage assessment does not replace that evaluation, and the importance of remaining in the ED until their evaluation is complete.  This chart was dictated using voice recognition software, Dragon. Despite the best efforts of this provider to proofread and correct errors, errors may still occur which can change documentation meaning.    Emeline Darling, PA-C 08/26/22 0104

## 2022-08-26 NOTE — ED Triage Notes (Signed)
Patient arrived with EMS from home reports central chest pain with SOB this evening radiating to right arm , he received ASA 324 mg prior to arrival by EMS . CBG=98.

## 2022-09-07 ENCOUNTER — Encounter (HOSPITAL_COMMUNITY): Payer: Self-pay

## 2022-09-07 ENCOUNTER — Emergency Department (HOSPITAL_COMMUNITY): Payer: No Typology Code available for payment source

## 2022-09-07 ENCOUNTER — Emergency Department (HOSPITAL_COMMUNITY)
Admission: EM | Admit: 2022-09-07 | Discharge: 2022-09-07 | Disposition: A | Discharge status: Home / Self Care | Payer: No Typology Code available for payment source | Attending: Emergency Medicine | Admitting: Emergency Medicine

## 2022-09-07 ENCOUNTER — Other Ambulatory Visit: Payer: Self-pay

## 2022-09-07 DIAGNOSIS — E1065 Type 1 diabetes mellitus with hyperglycemia: Secondary | ICD-10-CM | POA: Insufficient documentation

## 2022-09-07 DIAGNOSIS — Z794 Long term (current) use of insulin: Secondary | ICD-10-CM | POA: Diagnosis not present

## 2022-09-07 DIAGNOSIS — I10 Essential (primary) hypertension: Secondary | ICD-10-CM | POA: Insufficient documentation

## 2022-09-07 DIAGNOSIS — Z79899 Other long term (current) drug therapy: Secondary | ICD-10-CM | POA: Diagnosis not present

## 2022-09-07 DIAGNOSIS — N179 Acute kidney failure, unspecified: Secondary | ICD-10-CM | POA: Diagnosis not present

## 2022-09-07 DIAGNOSIS — M79672 Pain in left foot: Secondary | ICD-10-CM | POA: Insufficient documentation

## 2022-09-07 DIAGNOSIS — R739 Hyperglycemia, unspecified: Secondary | ICD-10-CM

## 2022-09-07 LAB — CBC WITH DIFFERENTIAL/PLATELET
Abs Immature Granulocytes: 0.01 10*3/uL (ref 0.00–0.07)
Basophils Absolute: 0.1 10*3/uL (ref 0.0–0.1)
Basophils Relative: 1 %
Eosinophils Absolute: 0.1 10*3/uL (ref 0.0–0.5)
Eosinophils Relative: 1 %
HCT: 46.6 % (ref 39.0–52.0)
Hemoglobin: 15.7 g/dL (ref 13.0–17.0)
Immature Granulocytes: 0 %
Lymphocytes Relative: 41 %
Lymphs Abs: 2.1 10*3/uL (ref 0.7–4.0)
MCH: 28.5 pg (ref 26.0–34.0)
MCHC: 33.7 g/dL (ref 30.0–36.0)
MCV: 84.7 fL (ref 80.0–100.0)
Monocytes Absolute: 0.4 10*3/uL (ref 0.1–1.0)
Monocytes Relative: 8 %
Neutro Abs: 2.4 10*3/uL (ref 1.7–7.7)
Neutrophils Relative %: 49 %
Platelets: 301 10*3/uL (ref 150–400)
RBC: 5.5 MIL/uL (ref 4.22–5.81)
RDW: 12.5 % (ref 11.5–15.5)
WBC: 5.1 10*3/uL (ref 4.0–10.5)
nRBC: 0 % (ref 0.0–0.2)

## 2022-09-07 LAB — I-STAT VENOUS BLOOD GAS, ED
Acid-Base Excess: 1 mmol/L (ref 0.0–2.0)
Bicarbonate: 24.5 mmol/L (ref 20.0–28.0)
Calcium, Ion: 1 mmol/L — ABNORMAL LOW (ref 1.15–1.40)
HCT: 48 % (ref 39.0–52.0)
Hemoglobin: 16.3 g/dL (ref 13.0–17.0)
O2 Saturation: 100 %
Potassium: 4.1 mmol/L (ref 3.5–5.1)
Sodium: 133 mmol/L — ABNORMAL LOW (ref 135–145)
TCO2: 25 mmol/L (ref 22–32)
pCO2, Ven: 33.9 mmHg — ABNORMAL LOW (ref 44–60)
pH, Ven: 7.466 — ABNORMAL HIGH (ref 7.25–7.43)
pO2, Ven: 178 mmHg — ABNORMAL HIGH (ref 32–45)

## 2022-09-07 LAB — BASIC METABOLIC PANEL
Anion gap: 14 (ref 5–15)
BUN: 19 mg/dL (ref 6–20)
CO2: 21 mmol/L — ABNORMAL LOW (ref 22–32)
Calcium: 8.8 mg/dL — ABNORMAL LOW (ref 8.9–10.3)
Chloride: 97 mmol/L — ABNORMAL LOW (ref 98–111)
Creatinine, Ser: 1.53 mg/dL — ABNORMAL HIGH (ref 0.61–1.24)
GFR, Estimated: 60 mL/min — ABNORMAL LOW (ref >60)
Glucose, Bld: 387 mg/dL — ABNORMAL HIGH (ref 70–99)
Potassium: 4.2 mmol/L (ref 3.5–5.1)
Sodium: 132 mmol/L — ABNORMAL LOW (ref 135–145)

## 2022-09-07 LAB — CBG MONITORING, ED
Glucose-Capillary: 417 mg/dL — ABNORMAL HIGH (ref 70–99)
Glucose-Capillary: 242 mg/dL — ABNORMAL HIGH (ref 70–99)

## 2022-09-07 LAB — BETA-HYDROXYBUTYRIC ACID: Beta-Hydroxybutyric Acid: 0.13 mmol/L (ref 0.05–0.27)

## 2022-09-07 MED ORDER — LACTATED RINGERS IV BOLUS
1000.0000 mL | Freq: Once | INTRAVENOUS | Status: AC
  Administered 2022-09-07: 1000 mL via INTRAVENOUS

## 2022-09-07 MED ORDER — ACETAMINOPHEN 500 MG PO TABS
1000.0000 mg | ORAL_TABLET | Freq: Once | ORAL | Status: DC
  Filled 2022-09-07: qty 2

## 2022-09-07 NOTE — ED Triage Notes (Signed)
Reports pain in left foot with difficulty walking.  Denies wound on foot.  Complains of numbness and tingling associated with it.  Reports he is diabetic.

## 2022-09-07 NOTE — ED Provider Notes (Signed)
[facility] Provider Note   CSN: [id number] Arrival date & time: 09/07/22  1116     History  Chief Complaint  Patient presents with   Foot Pain   Hyperglycemia    [name] [name] is a 37 y.o. male.   Foot Pain  Hyperglycemia    Patient with medical history of type 1 diabetes, hypertension, previous hospitalizations for HHS and DKA presents to the emergency department due to left foot pain.  This happened acutely yesterday while he was at work.  Denies any specific trauma but states it feels like tingling pain which is worse on the toes across his dorsum.  Worse with movement, he has not tried any medicine for prior to arrival.  He states he did take his insulin this morning but despite that he is hyperglycemic in triage with a CBG of 417.  Home Medications Prior to Admission medications   Medication Sig Start Date End Date Taking? Authorizing Provider  amLODipine (NORVASC) 10 MG tablet Take 1 tablet (10 mg total) by mouth daily. 08/14/22   [name], DO  blood glucose meter kit and supplies KIT Dispense based on patient and insurance preference. Use up to four times daily as directed. 08/14/22   [name], DO  glucose blood test strip Use 1 each in the morning, at noon, in the evening, and at bedtime. 08/14/22   [name], DO  insulin glargine (LANTUS) 100 UNIT/ML Solostar Pen Inject 40 Units into the skin at bedtime. 08/14/22   [name], DO  insulin lispro (HUMALOG) 100 UNIT/ML KwikPen Give per scale - CBG 70 - 120: 0 units; CBG 121 - 150: 12 units; CBG 151 - 200: 13 units; CBG 201 - 250: 15 units;  CBG 251 - 300: 18 units; CBG 301 - 350: 21 units; CBG 351 - 400: 25 units; CBG > 400: 30 units and call MD 08/14/22   [name], DO  Insulin Pen Needle 32G X 4 MM MISC Use 1 to give insulin in the morning, at noon, in the evening, and at bedtime. 08/14/22   [name], DO  Lancets MISC Use 1 each in the morning,  at noon, in the evening, and at bedtime. 08/14/22   [name], DO  lisinopril (ZESTRIL) 10 MG tablet Take 1 tablet (10 mg total) by mouth daily. 08/14/22   [name], DO      Allergies    Patient has no known allergies.    Review of Systems   Review of Systems  Physical Exam Updated Vital Signs BP (!) 131/91   Pulse (!) 108   Temp 98.9 F (37.2 C) (Oral)   Resp 13   Ht 5' 11"$  (1.803 m)   Wt 95.7 kg   SpO2 99%   BMI 29.43 kg/m  Physical Exam Vitals and nursing note reviewed. Exam conducted with a chaperone present.  Constitutional:      Appearance: Normal appearance.  HENT:     Head: Normocephalic and atraumatic.  Eyes:     General: No scleral icterus.       Right eye: No discharge.        Left eye: No discharge.     Extraocular Movements: Extraocular movements intact.     Pupils: Pupils are equal, round, and reactive to light.  Cardiovascular:     Rate and Rhythm: Normal rate and regular rhythm.     Pulses: Normal pulses.     Heart sounds: Normal heart  sounds.     No friction rub. No gallop.  Pulmonary:     Effort: Pulmonary effort is normal. No respiratory distress.     Breath sounds: Normal breath sounds.  Abdominal:     General: Abdomen is flat. Bowel sounds are normal. There is no distension.     Palpations: Abdomen is soft.     Tenderness: There is no abdominal tenderness.  Musculoskeletal:     Comments: Tenderness of the dorsum of toes to left lower extremity.  There is no crepitus, no contusion.  Able to wiggle each digit and range left ankle  Skin:    General: Skin is warm and dry.     Capillary Refill: Capillary refill takes less than 2 seconds.     Coloration: Skin is not jaundiced.  Neurological:     Mental Status: He is alert. Mental status is at baseline.     Coordination: Coordination normal.     ED Results / Procedures / Treatments   Labs (all labs ordered are listed, but only abnormal results are displayed) Labs Reviewed  BASIC  METABOLIC PANEL - Abnormal; Notable for the following components:      Result Value   Sodium 132 (*)    Chloride 97 (*)    CO2 21 (*)    Glucose, Bld 387 (*)    Creatinine, Ser 1.53 (*)    Calcium 8.8 (*)    GFR, Estimated 60 (*)    All other components within normal limits  CBG MONITORING, ED - Abnormal; Notable for the following components:   Glucose-Capillary 417 (*)    All other components within normal limits  I-STAT VENOUS BLOOD GAS, ED - Abnormal; Notable for the following components:   pH, Ven 7.466 (*)    pCO2, Ven 33.9 (*)    pO2, Ven 178 (*)    Sodium 133 (*)    Calcium, Ion 1.00 (*)    All other components within normal limits  CBG MONITORING, ED - Abnormal; Notable for the following components:   Glucose-Capillary 242 (*)    All other components within normal limits  CBC WITH DIFFERENTIAL/PLATELET  BETA-HYDROXYBUTYRIC ACID    EKG EKG Interpretation  Date/Time:  Saturday September 07 2022 12:04:42 EST Ventricular Rate:  98 PR Interval:  136 QRS Duration: 109 QT Interval:  352 QTC Calculation: 450 R Axis:   -47 Text Interpretation: Sinus rhythm Incomplete RBBB and LAFB Low voltage, precordial leads Consider anterior infarct Confirmed by Octaviano Glow [phone]) on 09/07/2022 12:08:48 PM  Radiology DG Foot Complete Left  Result Date: 09/07/2022 CLINICAL DATA:  Left foot pain with associated numbness and tingling EXAM: LEFT FOOT - COMPLETE 3 VIEW COMPARISON:  None Available. FINDINGS: There is no evidence of fracture or dislocation. No evidence of osseous erosion. There is no evidence of arthropathy or other focal bone abnormality. Mild soft tissue edema. No subcutaneous emphysema or radiopaque foreign body. IMPRESSION: Negative. Electronically Signed   By: Beryle Flock M.D.   On: 09/07/2022 12:27    Procedures Procedures    Medications Ordered in ED Medications  acetaminophen (TYLENOL) tablet 1,000 mg (1,000 mg Oral Patient Refused/Not Given 09/07/22 1229)   lactated ringers bolus 1,000 mL (1,000 mLs Intravenous New Bag/Given 09/07/22 1219)    ED Course/ Medical Decision Making/ A&P                             Medical Decision Making Amount and/or Complexity of  Data Reviewed Labs: ordered. Radiology: ordered.  Risk OTC drugs.   This is a 37 year old male presenting to the emergency department due to hyperglycemia and foot pain.  On exam he is neurovascular intact with brisk cap refill, DP and PT are 2+.  There is no wounds to the foot, he has tenderness to palpation of the toes there is no obvious crepitus and was atraumatic.  Not consistent with gout, ischemic limb, compartment syndrome, x-ray ordered by myself and negative for fracture or dislocation.  Will have him do Tylenol as needed for pain, given he has a slight AKI do not think he should be taking any NSAIDs given his poorly controlled diabetes would not advise any steroids.  Given he was hyperglycemic I ordered lactated Ringer fluid bolus.  CBC was without leukocytosis or anemia, BMP without gross electrolyte derangement although he does have an AKI with a creatinine 1.53 increased from baseline of 1.00.  This should have been treated with the fluids, he is still hyperglycemic at 387 but it is improving.  He is not in DKA, no anion gap acidosis.  VBG shows a pH of 7.45.  From that standpoint I do feel he is appropriate for outpatient follow-up and he continue taking the insulin.  Clinically, patient is not in DKA, not HHS.  Stable for discharge and close outpatient follow-up at this time.         Final Clinical Impression(s) / ED Diagnoses Final diagnoses:  Foot pain, left  Hyperglycemia  AKI (acute kidney injury) [facility])    Rx / DC Orders ED Discharge Orders     None         Sherrill Raring, [place] 09/07/22 1327    Wyvonnia Dusky, MD 09/07/22 [phone]

## 2022-09-07 NOTE — Discharge Instructions (Addendum)
Take Tylenol for pain.  Drink plenty of fluids, take your insulin and diabetic medicine as prescribed.  If your pain in the foot continues follow-up with your primary care doctor.  Return to the ED for new or concerning symptoms.

## 2022-09-24 ENCOUNTER — Other Ambulatory Visit (HOSPITAL_COMMUNITY): Payer: Self-pay

## 2022-10-14 ENCOUNTER — Emergency Department (HOSPITAL_COMMUNITY)
Admission: EM | Admit: 2022-10-14 | Discharge: 2022-10-15 | Disposition: A | Discharge status: Home / Self Care | Payer: Self-pay | Attending: Emergency Medicine | Admitting: Emergency Medicine

## 2022-10-14 ENCOUNTER — Other Ambulatory Visit: Payer: Self-pay

## 2022-10-14 ENCOUNTER — Encounter (HOSPITAL_COMMUNITY): Payer: Self-pay

## 2022-10-14 DIAGNOSIS — R410 Disorientation, unspecified: Secondary | ICD-10-CM | POA: Insufficient documentation

## 2022-10-14 DIAGNOSIS — Z794 Long term (current) use of insulin: Secondary | ICD-10-CM | POA: Insufficient documentation

## 2022-10-14 DIAGNOSIS — Z79899 Other long term (current) drug therapy: Secondary | ICD-10-CM | POA: Insufficient documentation

## 2022-10-14 DIAGNOSIS — R739 Hyperglycemia, unspecified: Secondary | ICD-10-CM | POA: Insufficient documentation

## 2022-10-14 HISTORY — DX: Type 2 diabetes mellitus without complications: E11.9

## 2022-10-14 HISTORY — DX: Hyperlipidemia, unspecified: E78.5

## 2022-10-14 HISTORY — DX: Essential (primary) hypertension: I10

## 2022-10-14 LAB — COMPREHENSIVE METABOLIC PANEL
ALT: 17 U/L (ref 0–44)
AST: 18 U/L (ref 15–41)
Albumin: 4.1 g/dL (ref 3.5–5.0)
Alkaline Phosphatase: 67 U/L (ref 38–126)
Anion gap: 6 (ref 5–15)
BUN: 9 mg/dL (ref 6–20)
CO2: 25 mmol/L (ref 22–32)
Calcium: 8.7 mg/dL — ABNORMAL LOW (ref 8.9–10.3)
Chloride: 97 mmol/L — ABNORMAL LOW (ref 98–111)
Creatinine, Ser: 1.16 mg/dL (ref 0.61–1.24)
GFR, Estimated: >60 mL/min (ref >60)
Glucose, Bld: 562 mg/dL (ref 70–99)
Potassium: 4 mmol/L (ref 3.5–5.1)
Sodium: 128 mmol/L — ABNORMAL LOW (ref 135–145)
Total Bilirubin: 0.6 mg/dL (ref 0.3–1.2)
Total Protein: 7.4 g/dL (ref 6.5–8.1)

## 2022-10-14 LAB — RAPID URINE DRUG SCREEN, HOSP PERFORMED
Amphetamines: POSITIVE — AB
Barbiturates: NOT DETECTED
Benzodiazepines: NOT DETECTED
Cocaine: NOT DETECTED
Opiates: NOT DETECTED
Tetrahydrocannabinol: NOT DETECTED

## 2022-10-14 LAB — CBC WITH DIFFERENTIAL/PLATELET
Abs Immature Granulocytes: 0.01 10*3/uL (ref 0.00–0.07)
Basophils Absolute: 0 10*3/uL (ref 0.0–0.1)
Basophils Relative: 1 %
Eosinophils Absolute: 0.1 10*3/uL (ref 0.0–0.5)
Eosinophils Relative: 2 %
HCT: 45.4 % (ref 39.0–52.0)
Hemoglobin: 15.1 g/dL (ref 13.0–17.0)
Immature Granulocytes: 0 %
Lymphocytes Relative: 45 %
Lymphs Abs: 2.1 10*3/uL (ref 0.7–4.0)
MCH: 28.2 pg (ref 26.0–34.0)
MCHC: 33.3 g/dL (ref 30.0–36.0)
MCV: 84.7 fL (ref 80.0–100.0)
Monocytes Absolute: 0.4 10*3/uL (ref 0.1–1.0)
Monocytes Relative: 9 %
Neutro Abs: 2.1 10*3/uL (ref 1.7–7.7)
Neutrophils Relative %: 43 %
Platelets: 260 10*3/uL (ref 150–400)
RBC: 5.36 MIL/uL (ref 4.22–5.81)
RDW: 12.8 % (ref 11.5–15.5)
WBC: 4.7 10*3/uL (ref 4.0–10.5)
nRBC: 0 % (ref 0.0–0.2)

## 2022-10-14 LAB — SALICYLATE LEVEL: Salicylate Lvl: <7 mg/dL — ABNORMAL LOW (ref 7.0–30.0)

## 2022-10-14 LAB — CBG MONITORING, ED: Glucose-Capillary: 533 mg/dL (ref 70–99)

## 2022-10-14 LAB — ACETAMINOPHEN LEVEL: Acetaminophen (Tylenol), Serum: <10 ug/mL — ABNORMAL LOW (ref 10–30)

## 2022-10-14 LAB — ETHANOL: Alcohol, Ethyl (B): <10 mg/dL (ref <10)

## 2022-10-14 MED ORDER — SODIUM CHLORIDE 0.9 % IV BOLUS
1000.0000 mL | Freq: Once | INTRAVENOUS | Status: AC
  Administered 2022-10-14: 1000 mL via INTRAVENOUS

## 2022-10-14 MED ORDER — INSULIN ASPART 100 UNIT/ML IJ SOLN
10.0000 [IU] | Freq: Once | INTRAMUSCULAR | Status: AC
  Administered 2022-10-14: 10 [IU] via SUBCUTANEOUS
  Filled 2022-10-14: qty 0.1

## 2022-10-15 ENCOUNTER — Emergency Department (HOSPITAL_COMMUNITY): Payer: Self-pay

## 2022-10-15 ENCOUNTER — Encounter (HOSPITAL_COMMUNITY): Payer: Self-pay

## 2022-10-15 ENCOUNTER — Telehealth: Payer: Self-pay | Admitting: *Deleted

## 2022-10-15 ENCOUNTER — Other Ambulatory Visit (HOSPITAL_COMMUNITY): Payer: Self-pay

## 2022-10-15 LAB — OSMOLALITY: Osmolality: 305 mOsm/kg — ABNORMAL HIGH (ref 275–295)

## 2022-10-15 LAB — URINALYSIS, ROUTINE W REFLEX MICROSCOPIC
Bacteria, UA: NONE SEEN
Bilirubin Urine: NEGATIVE
Glucose, UA: >=500 mg/dL — AB
Hgb urine dipstick: NEGATIVE
Ketones, ur: NEGATIVE mg/dL
Leukocytes,Ua: NEGATIVE
Nitrite: NEGATIVE
Protein, ur: NEGATIVE mg/dL
Specific Gravity, Urine: 1.027 (ref 1.005–1.030)
pH: 6 (ref 5.0–8.0)

## 2022-10-15 LAB — CBG MONITORING, ED
Glucose-Capillary: 209 mg/dL — ABNORMAL HIGH (ref 70–99)
Glucose-Capillary: 228 mg/dL — ABNORMAL HIGH (ref 70–99)
Glucose-Capillary: 425 mg/dL — ABNORMAL HIGH (ref 70–99)
Glucose-Capillary: 552 mg/dL (ref 70–99)

## 2022-10-15 LAB — BLOOD GAS, VENOUS
Acid-Base Excess: 1.5 mmol/L (ref 0.0–2.0)
Bicarbonate: 27.7 mmol/L (ref 20.0–28.0)
O2 Saturation: 80.7 %
Patient temperature: 37
pCO2, Ven: 49 mmHg (ref 44–60)
pH, Ven: 7.36 (ref 7.25–7.43)
pO2, Ven: 47 mmHg — ABNORMAL HIGH (ref 32–45)

## 2022-10-15 LAB — AMMONIA: Ammonia: 24 umol/L (ref 9–35)

## 2022-10-15 MED ORDER — INSULIN GLARGINE 100 UNIT/ML SOLOSTAR PEN
46.0000 [IU] | PEN_INJECTOR | Freq: Every day | SUBCUTANEOUS | 0 refills | Status: DC
  Filled 2022-10-15: qty 15, 32d supply, fill #0

## 2022-10-15 MED ORDER — INSULIN LISPRO (1 UNIT DIAL) 100 UNIT/ML (KWIKPEN)
20.0000 [IU] | PEN_INJECTOR | Freq: Three times a day (TID) | SUBCUTANEOUS | 0 refills | Status: DC
  Filled 2022-10-15: qty 18, 30d supply, fill #0

## 2022-10-15 MED ORDER — INSULIN GLARGINE 100 UNIT/ML ~~LOC~~ SOLN: 46.0000 [IU] | Freq: Every day | SUBCUTANEOUS | 0 refills | Status: DC

## 2022-10-15 MED ORDER — SODIUM CHLORIDE 0.9 % IV BOLUS
1000.0000 mL | Freq: Once | INTRAVENOUS | Status: AC
  Administered 2022-10-15: 1000 mL via INTRAVENOUS

## 2022-10-15 MED ORDER — INSULIN LISPRO 100 UNIT/ML IJ SOLN: 20.0000 [IU] | Freq: Three times a day (TID) | INTRAMUSCULAR | 0 refills | Status: DC

## 2022-10-15 NOTE — Telephone Encounter (Signed)
RNCM consulted for medication assistance for pt in lobby discharged overnight. RNCM reviewed chart and found that Pt is eligible for [facility] MATCH program (unable to find pt listed in PROCARE per cardholder name inquiry) and has agreed to accept [facility] with co-pay override. PROCARE information entered; Rx printed and pt will fill at [facility]. RNCM updated ED RN.

## 2022-11-05 ENCOUNTER — Emergency Department (HOSPITAL_COMMUNITY): Payer: Self-pay

## 2022-11-05 ENCOUNTER — Emergency Department (HOSPITAL_COMMUNITY)
Admission: EM | Admit: 2022-11-05 | Discharge: 2022-11-05 | Disposition: A | Discharge status: Home / Self Care | Payer: Self-pay | Attending: Emergency Medicine | Admitting: Emergency Medicine

## 2022-11-05 ENCOUNTER — Other Ambulatory Visit (HOSPITAL_COMMUNITY): Payer: Self-pay

## 2022-11-05 ENCOUNTER — Other Ambulatory Visit: Payer: Self-pay

## 2022-11-05 ENCOUNTER — Encounter (HOSPITAL_COMMUNITY): Payer: Self-pay

## 2022-11-05 DIAGNOSIS — E1165 Type 2 diabetes mellitus with hyperglycemia: Secondary | ICD-10-CM | POA: Insufficient documentation

## 2022-11-05 DIAGNOSIS — Z794 Long term (current) use of insulin: Secondary | ICD-10-CM | POA: Insufficient documentation

## 2022-11-05 DIAGNOSIS — I10 Essential (primary) hypertension: Secondary | ICD-10-CM | POA: Insufficient documentation

## 2022-11-05 DIAGNOSIS — Z79899 Other long term (current) drug therapy: Secondary | ICD-10-CM | POA: Insufficient documentation

## 2022-11-05 DIAGNOSIS — R739 Hyperglycemia, unspecified: Secondary | ICD-10-CM

## 2022-11-05 LAB — URINALYSIS, ROUTINE W REFLEX MICROSCOPIC
Bacteria, UA: NONE SEEN
Bilirubin Urine: NEGATIVE
Glucose, UA: >=500 mg/dL — AB
Hgb urine dipstick: NEGATIVE
Ketones, ur: 5 mg/dL — AB
Leukocytes,Ua: NEGATIVE
Nitrite: NEGATIVE
Protein, ur: NEGATIVE mg/dL
Specific Gravity, Urine: 1.029 (ref 1.005–1.030)
pH: 5 (ref 5.0–8.0)

## 2022-11-05 LAB — BASIC METABOLIC PANEL
Anion gap: 11 (ref 5–15)
BUN: 11 mg/dL (ref 6–20)
CO2: 23 mmol/L (ref 22–32)
Calcium: 8.7 mg/dL — ABNORMAL LOW (ref 8.9–10.3)
Chloride: 94 mmol/L — ABNORMAL LOW (ref 98–111)
Creatinine, Ser: 1.13 mg/dL (ref 0.61–1.24)
GFR, Estimated: >60 mL/min (ref >60)
Glucose, Bld: 646 mg/dL (ref 70–99)
Potassium: 4.5 mmol/L (ref 3.5–5.1)
Sodium: 128 mmol/L — ABNORMAL LOW (ref 135–145)

## 2022-11-05 LAB — CBC WITH DIFFERENTIAL/PLATELET
Abs Immature Granulocytes: 0.03 10*3/uL (ref 0.00–0.07)
Basophils Absolute: 0 10*3/uL (ref 0.0–0.1)
Basophils Relative: 1 %
Eosinophils Absolute: 0.1 10*3/uL (ref 0.0–0.5)
Eosinophils Relative: 1 %
HCT: 48.6 % (ref 39.0–52.0)
Hemoglobin: 16.8 g/dL (ref 13.0–17.0)
Immature Granulocytes: 0 %
Lymphocytes Relative: 28 %
Lymphs Abs: 2.1 10*3/uL (ref 0.7–4.0)
MCH: 28.5 pg (ref 26.0–34.0)
MCHC: 34.6 g/dL (ref 30.0–36.0)
MCV: 82.5 fL (ref 80.0–100.0)
Monocytes Absolute: 0.8 10*3/uL (ref 0.1–1.0)
Monocytes Relative: 10 %
Neutro Abs: 4.6 10*3/uL (ref 1.7–7.7)
Neutrophils Relative %: 60 %
Platelets: 289 10*3/uL (ref 150–400)
RBC: 5.89 MIL/uL — ABNORMAL HIGH (ref 4.22–5.81)
RDW: 12.3 % (ref 11.5–15.5)
WBC: 7.6 10*3/uL (ref 4.0–10.5)
nRBC: 0 % (ref 0.0–0.2)

## 2022-11-05 LAB — I-STAT VENOUS BLOOD GAS, ED
Acid-base deficit: 3 mmol/L — ABNORMAL HIGH (ref 0.0–2.0)
Bicarbonate: 25.4 mmol/L (ref 20.0–28.0)
Calcium, Ion: 1.09 mmol/L — ABNORMAL LOW (ref 1.15–1.40)
HCT: 50 % (ref 39.0–52.0)
Hemoglobin: 17 g/dL (ref 13.0–17.0)
O2 Saturation: 99 %
Potassium: 4.8 mmol/L (ref 3.5–5.1)
Sodium: 126 mmol/L — ABNORMAL LOW (ref 135–145)
TCO2: 27 mmol/L (ref 22–32)
pCO2, Ven: 54.8 mmHg (ref 44–60)
pH, Ven: 7.274 (ref 7.25–7.43)
pO2, Ven: 165 mmHg — ABNORMAL HIGH (ref 32–45)

## 2022-11-05 LAB — RAPID URINE DRUG SCREEN, HOSP PERFORMED
Amphetamines: POSITIVE — AB
Barbiturates: NOT DETECTED
Benzodiazepines: NOT DETECTED
Cocaine: NOT DETECTED
Opiates: NOT DETECTED
Tetrahydrocannabinol: NOT DETECTED

## 2022-11-05 LAB — ETHANOL: Alcohol, Ethyl (B): <10 mg/dL (ref <10)

## 2022-11-05 LAB — OSMOLALITY: Osmolality: 310 mOsm/kg — ABNORMAL HIGH (ref 275–295)

## 2022-11-05 LAB — CBG MONITORING, ED
Glucose-Capillary: >600 mg/dL (ref 70–99)
Glucose-Capillary: 523 mg/dL (ref 70–99)
Glucose-Capillary: 324 mg/dL — ABNORMAL HIGH (ref 70–99)

## 2022-11-05 LAB — BETA-HYDROXYBUTYRIC ACID: Beta-Hydroxybutyric Acid: 0.66 mmol/L — ABNORMAL HIGH (ref 0.05–0.27)

## 2022-11-05 LAB — LACTIC ACID, PLASMA: Lactic Acid, Venous: 2.5 mmol/L (ref 0.5–1.9)

## 2022-11-05 MED ORDER — INSULIN ASPART 100 UNIT/ML IJ SOLN
10.0000 [IU] | Freq: Once | INTRAMUSCULAR | Status: AC
  Administered 2022-11-05: 10 [IU] via SUBCUTANEOUS

## 2022-11-05 MED ORDER — BLOOD GLUCOSE MONITOR KIT
PACK | 0 refills | Status: DC
  Filled 2022-11-05: qty 1, 25d supply, fill #0

## 2022-11-05 MED ORDER — AMLODIPINE BESYLATE 10 MG PO TABS
10.0000 mg | ORAL_TABLET | Freq: Every day | ORAL | 0 refills | Status: DC
  Filled 2022-11-05: qty 30, 30d supply, fill #0

## 2022-11-05 MED ORDER — LISINOPRIL 10 MG PO TABS
10.0000 mg | ORAL_TABLET | Freq: Every day | ORAL | 0 refills | Status: DC
  Filled 2022-11-05: qty 30, 30d supply, fill #0

## 2022-11-05 MED ORDER — INSULIN GLARGINE 100 UNIT/ML SOLOSTAR PEN
40.0000 [IU] | PEN_INJECTOR | Freq: Every day | SUBCUTANEOUS | 0 refills | Status: DC
  Filled 2022-11-05: qty 12, 30d supply, fill #0

## 2022-11-05 MED ORDER — ONDANSETRON HCL 4 MG/2ML IJ SOLN
4.0000 mg | Freq: Once | INTRAMUSCULAR | Status: AC
  Administered 2022-11-05: 4 mg via INTRAVENOUS
  Filled 2022-11-05: qty 2

## 2022-11-05 MED ORDER — GLUCOSE BLOOD VI STRP
1.0000 | ORAL_STRIP | Freq: Four times a day (QID) | 0 refills | Status: AC
  Filled 2022-11-05 – 2023-01-02 (×3): qty 100, 25d supply, fill #0

## 2022-11-05 MED ORDER — INSULIN LISPRO 100 UNIT/ML IJ SOLN: 20.0000 [IU] | Freq: Three times a day (TID) | INTRAMUSCULAR | 0 refills | Status: DC

## 2022-11-05 MED ORDER — INSULIN ASPART 100 UNIT/ML IJ SOLN: 10.0000 [IU] | Freq: Once | INTRAMUSCULAR | Status: DC

## 2022-11-05 MED ORDER — LACTATED RINGERS IV BOLUS
2000.0000 mL | INTRAVENOUS | Status: AC
  Administered 2022-11-05 (×2): 2000 mL via INTRAVENOUS

## 2022-11-05 MED ORDER — LACTATED RINGERS IV BOLUS: 1000.0000 mL | Freq: Once | INTRAVENOUS | Status: DC
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
## 2022-11-05 NOTE — ED Notes (Signed)
Pt refusing to let phlebotomy get blood.
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
## 2022-11-05 NOTE — Discharge Planning (Signed)
RNCM reinstated pt in [facility] program but he did not use as he was discharged prior to Rx delivery.

## 2022-11-23 ENCOUNTER — Other Ambulatory Visit (HOSPITAL_COMMUNITY): Payer: Self-pay

## 2022-11-24 ENCOUNTER — Encounter (HOSPITAL_COMMUNITY): Payer: Self-pay

## 2022-11-24 ENCOUNTER — Emergency Department (HOSPITAL_COMMUNITY)
Admission: EM | Admit: 2022-11-24 | Discharge: 2022-11-24 | Disposition: A | Discharge status: Home / Self Care | Payer: 59 | Attending: Emergency Medicine | Admitting: Emergency Medicine

## 2022-11-24 ENCOUNTER — Other Ambulatory Visit: Payer: Self-pay

## 2022-11-24 DIAGNOSIS — R739 Hyperglycemia, unspecified: Secondary | ICD-10-CM | POA: Diagnosis present

## 2022-11-24 DIAGNOSIS — I10 Essential (primary) hypertension: Secondary | ICD-10-CM | POA: Diagnosis not present

## 2022-11-24 DIAGNOSIS — Z79899 Other long term (current) drug therapy: Secondary | ICD-10-CM | POA: Insufficient documentation

## 2022-11-24 DIAGNOSIS — E1065 Type 1 diabetes mellitus with hyperglycemia: Secondary | ICD-10-CM | POA: Insufficient documentation

## 2022-11-24 LAB — BLOOD GAS, VENOUS
Acid-Base Excess: 2.2 mmol/L — ABNORMAL HIGH (ref 0.0–2.0)
Bicarbonate: 29.1 mmol/L — ABNORMAL HIGH (ref 20.0–28.0)
O2 Saturation: 75.9 %
Patient temperature: 36.1
pCO2, Ven: 52 mmHg (ref 44–60)
pH, Ven: 7.35 (ref 7.25–7.43)
pO2, Ven: 44 mmHg (ref 32–45)

## 2022-11-24 LAB — OSMOLALITY: Osmolality: 318 mOsm/kg — ABNORMAL HIGH (ref 275–295)

## 2022-11-24 LAB — URINALYSIS, ROUTINE W REFLEX MICROSCOPIC
Bacteria, UA: NONE SEEN
Bilirubin Urine: NEGATIVE
Glucose, UA: >=500 mg/dL — AB
Hgb urine dipstick: NEGATIVE
Ketones, ur: NEGATIVE mg/dL
Leukocytes,Ua: NEGATIVE
Nitrite: NEGATIVE
Protein, ur: NEGATIVE mg/dL
Specific Gravity, Urine: 1.027 (ref 1.005–1.030)
pH: 5 (ref 5.0–8.0)

## 2022-11-24 LAB — BASIC METABOLIC PANEL
Anion gap: 16 — ABNORMAL HIGH (ref 5–15)
Anion gap: 12 (ref 5–15)
Anion gap: 11 (ref 5–15)
BUN: 22 mg/dL — ABNORMAL HIGH (ref 6–20)
BUN: 18 mg/dL (ref 6–20)
BUN: 15 mg/dL (ref 6–20)
CO2: 23 mmol/L (ref 22–32)
CO2: 23 mmol/L (ref 22–32)
CO2: 22 mmol/L (ref 22–32)
Calcium: 9.2 mg/dL (ref 8.9–10.3)
Calcium: 8.6 mg/dL — ABNORMAL LOW (ref 8.9–10.3)
Calcium: 9 mg/dL (ref 8.9–10.3)
Chloride: 83 mmol/L — ABNORMAL LOW (ref 98–111)
Chloride: 95 mmol/L — ABNORMAL LOW (ref 98–111)
Chloride: 102 mmol/L (ref 98–111)
Creatinine, Ser: 1.42 mg/dL — ABNORMAL HIGH (ref 0.61–1.24)
Creatinine, Ser: 1.15 mg/dL (ref 0.61–1.24)
Creatinine, Ser: 0.93 mg/dL (ref 0.61–1.24)
GFR, Estimated: >60 mL/min (ref >60)
GFR, Estimated: >60 mL/min (ref >60)
GFR, Estimated: >60 mL/min (ref >60)
Glucose, Bld: 937 mg/dL (ref 70–99)
Glucose, Bld: 500 mg/dL — ABNORMAL HIGH (ref 70–99)
Glucose, Bld: 104 mg/dL — ABNORMAL HIGH (ref 70–99)
Potassium: 4.7 mmol/L (ref 3.5–5.1)
Potassium: 3.9 mmol/L (ref 3.5–5.1)
Potassium: 3.6 mmol/L (ref 3.5–5.1)
Sodium: 122 mmol/L — ABNORMAL LOW (ref 135–145)
Sodium: 130 mmol/L — ABNORMAL LOW (ref 135–145)
Sodium: 135 mmol/L (ref 135–145)

## 2022-11-24 LAB — CBC WITH DIFFERENTIAL/PLATELET
Abs Immature Granulocytes: 0 10*3/uL (ref 0.00–0.07)
Basophils Absolute: 0.1 10*3/uL (ref 0.0–0.1)
Basophils Relative: 1 %
Eosinophils Absolute: 0.1 10*3/uL (ref 0.0–0.5)
Eosinophils Relative: 1 %
HCT: 47.3 % (ref 39.0–52.0)
Hemoglobin: 15.7 g/dL (ref 13.0–17.0)
Immature Granulocytes: 0 %
Lymphocytes Relative: 34 %
Lymphs Abs: 1.5 10*3/uL (ref 0.7–4.0)
MCH: 27.9 pg (ref 26.0–34.0)
MCHC: 33.2 g/dL (ref 30.0–36.0)
MCV: 84.2 fL (ref 80.0–100.0)
Monocytes Absolute: 0.5 10*3/uL (ref 0.1–1.0)
Monocytes Relative: 11 %
Neutro Abs: 2.4 10*3/uL (ref 1.7–7.7)
Neutrophils Relative %: 53 %
Platelets: 230 10*3/uL (ref 150–400)
RBC: 5.62 MIL/uL (ref 4.22–5.81)
RDW: 12.5 % (ref 11.5–15.5)
WBC: 4.6 10*3/uL (ref 4.0–10.5)
nRBC: 0 % (ref 0.0–0.2)

## 2022-11-24 LAB — BETA-HYDROXYBUTYRIC ACID
Beta-Hydroxybutyric Acid: 0.73 mmol/L — ABNORMAL HIGH (ref 0.05–0.27)
Beta-Hydroxybutyric Acid: 0.56 mmol/L — ABNORMAL HIGH (ref 0.05–0.27)

## 2022-11-24 LAB — CBG MONITORING, ED
Glucose-Capillary: >600 mg/dL (ref 70–99)
Glucose-Capillary: >600 mg/dL (ref 70–99)
Glucose-Capillary: 465 mg/dL — ABNORMAL HIGH (ref 70–99)
Glucose-Capillary: 176 mg/dL — ABNORMAL HIGH (ref 70–99)

## 2022-11-24 LAB — ETHANOL: Alcohol, Ethyl (B): <10 mg/dL (ref <10)

## 2022-11-24 MED ORDER — INSULIN PEN NEEDLE 32G X 4 MM MISC
1.0000 | Freq: Four times a day (QID) | 1 refills | Status: DC
  Filled 2022-11-24: qty 100, 25d supply, fill #0

## 2022-11-24 MED ORDER — INSULIN LISPRO 100 UNIT/ML IJ SOLN: 20.0000 [IU] | Freq: Three times a day (TID) | INTRAMUSCULAR | 0 refills | Status: DC

## 2022-11-24 MED ORDER — METOCLOPRAMIDE HCL 5 MG/ML IJ SOLN
10.0000 mg | Freq: Once | INTRAMUSCULAR | Status: AC
  Administered 2022-11-24: 10 mg via INTRAVENOUS
  Filled 2022-11-24: qty 2

## 2022-11-24 MED ORDER — INSULIN LISPRO 100 UNIT/ML IJ SOLN
20.0000 [IU] | Freq: Three times a day (TID) | INTRAMUSCULAR | 0 refills | Status: DC
  Filled 2022-11-24: qty 20, 34d supply, fill #0

## 2022-11-24 MED ORDER — INSULIN GLARGINE 100 UNIT/ML SOLOSTAR PEN
40.0000 [IU] | PEN_INJECTOR | Freq: Every day | SUBCUTANEOUS | 0 refills | Status: DC
  Filled 2022-11-24: qty 12, 30d supply, fill #0

## 2022-11-24 MED ORDER — INSULIN ASPART 100 UNIT/ML IJ SOLN
5.0000 [IU] | Freq: Once | INTRAMUSCULAR | Status: AC
  Administered 2022-11-24: 5 [IU] via SUBCUTANEOUS
  Filled 2022-11-24: qty 0.05

## 2022-11-24 MED ORDER — INSULIN ASPART 100 UNIT/ML IJ SOLN
10.0000 [IU] | Freq: Once | INTRAMUSCULAR | Status: AC
  Administered 2022-11-24: 10 [IU] via SUBCUTANEOUS
  Filled 2022-11-24: qty 0.1

## 2022-11-24 MED ORDER — SODIUM CHLORIDE 0.9 % IV BOLUS
1000.0000 mL | Freq: Once | INTRAVENOUS | Status: AC
  Administered 2022-11-24: 1000 mL via INTRAVENOUS

## 2022-11-24 MED ORDER — INSULIN PEN NEEDLE 32G X 4 MM MISC: 1.0000 | Freq: Four times a day (QID) | 1 refills | Status: DC

## 2022-11-24 MED ORDER — INSULIN GLARGINE 100 UNIT/ML SOLOSTAR PEN: 40.0000 [IU] | PEN_INJECTOR | Freq: Every day | SUBCUTANEOUS | 0 refills | Status: DC

## 2022-11-24 MED ORDER — LACTATED RINGERS IV BOLUS
1950.0000 mL | Freq: Once | INTRAVENOUS | Status: AC
  Administered 2022-11-24: 1950 mL via INTRAVENOUS

## 2022-11-24 NOTE — ED Notes (Signed)
PO challenge successful. Pt able to tolerate PO fluids
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
## 2022-11-24 NOTE — ED Provider Notes (Signed)
Care of patient assumed from Dr. Eudelia Bunch.  This patient presents with nausea.  Found to have severe hyperglycemia.  This is in the setting of not taking his insulin over the past week.  Current therapy includes 3 L of IVF, and 15 units of insulin. Physical Exam  BP (!) 136/103   Pulse 90   Temp 98.4 F (36.9 C) (Oral)   Resp 18   SpO2 100%   Physical Exam Vitals and nursing note reviewed.  Constitutional:      General: He is not in acute distress.    Appearance: Normal appearance. He is well-developed. He is not ill-appearing, toxic-appearing or diaphoretic.  HENT:     Head: Normocephalic and atraumatic.     Right Ear: External ear normal.     Left Ear: External ear normal.     Nose: Nose normal.     Mouth/Throat:     Mouth: Mucous membranes are moist.  Eyes:     Extraocular Movements: Extraocular movements intact.     Conjunctiva/sclera: Conjunctivae normal.  Cardiovascular:     Rate and Rhythm: Normal rate and regular rhythm.  Pulmonary:     Effort: Pulmonary effort is normal. No respiratory distress.  Abdominal:     General: There is no distension.     Palpations: Abdomen is soft.  Musculoskeletal:        General: No swelling. Normal range of motion.     Cervical back: Normal range of motion and neck supple.  Skin:    General: Skin is warm and dry.     Coloration: Skin is not jaundiced or pale.  Neurological:     General: No focal deficit present.     Mental Status: He is alert and oriented to person, place, and time.  Psychiatric:        Mood and Affect: Mood normal.        Behavior: Behavior normal.     Procedures  Procedures  ED Course / MDM    Medical Decision Making Amount and/or Complexity of Data Reviewed Labs: ordered.  Risk Prescription drug management.   On assessment, patient is sleeping.  He is easily awakened.  He states that he feels fatigued due to feeling more out from his recent hyperglycemia.  On repeat CBG, blood sugars trending down.   On repeat BMP, creatinine elevation has resolved.  Patient requests social work help with getting his medications.  TOC was consulted.  TOC was able to provide information for him about match program.  With this program, patient will be able to fill prescriptions for $3 co-pay.  New insulin prescriptions were provided.  Patient was discharged in stable condition.       Gloris [place], MD 11/24/22 1359

## 2022-11-24 NOTE — ED Notes (Signed)
Pt is sleep went in room to check temp

## 2022-11-24 NOTE — Discharge Instructions (Addendum)
MATCH Medication Assistance Card Name: Murad Staples ZO:[id number] [facility]: [id number] RX Group: [id number] Discharge Date:  11/23/2022 Expiration Date:  05/3//2024 (must be filled within 7 days of discharge) Dear ______Ryan  Bonita Quin have been approved to have the prescriptions written by your discharging physician filled through our [facility] (Medication Assistance Through [facility]) program. This program allows for a one-time (no refills) 34-day supply of selected medications for a low copay amount.  The copay is $3.00 per prescription. For instance, if you have one prescription, you will pay $3.00; for two prescriptions, you pay $6.00; for three prescriptions, you pay $9.00; and so on.  Only certain pharmacies are participating in this program with [facility]. You will need to select one of the pharmacies from the attached list and take your prescriptions, this letter, and your photo ID to one of the participating pharmacies.   We are excited that you are able to use the [facility] program to get your medications. These prescriptions must be filled within 7 days of hospital discharge or they will no longer be valid for the [facility] program. Should you have any problems with your prescriptions please contact your case management team member at [phone] for [place] [facility].  Thank you, Gilda Crease DNP, MSN, RN [phone]   [facility]    [place]. [facility] - Locations  [facility]        [address], [place], [place]  [facility]    [address] [place], [place], [place]   [facility]        [address], [place], [place]  [facility]       [address] [place], [place], [place]  Other [facility] [address] [place], [place]  [facility]                                                                   [address] [place], [place]  [facility]          [address], [place], [place]  [facility] [address] [place], [place]   [address] [place], [address], [place]  [address] [place], [place]  [address], [place], [place]   [address], [place], [place]   [address] [place], [place]   [address] [place], [place] [address], [place], [place] [address], [place], [place]    [facility] [address], [place], [place]  [address] [place], [place] [address], [place], [place]  [address] [place] [address] [place], [place] [address], [place], [place] [address], [place], [place]  [address], [place], [place] [address] [place], [place], [place] [address], [place], [place] [address], [place], [place]   [address], [place], [place] [address], [place], [place] [address], [place], [place] [address] [place], [place]   [address] [place], [place]   [address] [place], [place] [address] [place] [address],  [place], [place], [place], [place] [address] [place], [place], [place], [place], [place], [place], [place], [place], [place], [place], [place], [place] [address], [place], [place] [address] [place], [place], [place], [place], [place], [place], [place] [address], [place], [place], [place], [place] (effective 01/26/17) [address] [place], [place] (effective 01/26/17)
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
## 2022-11-25 ENCOUNTER — Other Ambulatory Visit (HOSPITAL_COMMUNITY): Payer: Self-pay

## 2022-12-02 ENCOUNTER — Other Ambulatory Visit: Payer: Self-pay

## 2022-12-02 ENCOUNTER — Emergency Department (HOSPITAL_COMMUNITY)
Admission: EM | Admit: 2022-12-02 | Discharge: 2022-12-02 | Disposition: A | Discharge status: Home / Self Care | Payer: Self-pay | Attending: Emergency Medicine | Admitting: Emergency Medicine

## 2022-12-02 DIAGNOSIS — H5713 Ocular pain, bilateral: Secondary | ICD-10-CM | POA: Insufficient documentation
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
## 2022-12-02 NOTE — ED Notes (Signed)
Patient washed eyes with soap and water. Able to keep eyes open, no vision changes.
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
## 2022-12-02 NOTE — Discharge Instructions (Signed)
Continue to rinse your eyes with a little bit of soap and water.  Symptoms should resolve on their own.

## 2022-12-13 ENCOUNTER — Emergency Department (HOSPITAL_COMMUNITY)
Admission: EM | Admit: 2022-12-13 | Discharge: 2022-12-13 | Disposition: A | Discharge status: Home / Self Care | Payer: Self-pay | Attending: Emergency Medicine | Admitting: Emergency Medicine

## 2022-12-13 ENCOUNTER — Emergency Department (HOSPITAL_COMMUNITY): Payer: Self-pay

## 2022-12-13 DIAGNOSIS — E1165 Type 2 diabetes mellitus with hyperglycemia: Secondary | ICD-10-CM | POA: Insufficient documentation

## 2022-12-13 DIAGNOSIS — R739 Hyperglycemia, unspecified: Secondary | ICD-10-CM

## 2022-12-13 DIAGNOSIS — Z794 Long term (current) use of insulin: Secondary | ICD-10-CM | POA: Insufficient documentation

## 2022-12-13 DIAGNOSIS — I1A0 Resistant hypertension: Secondary | ICD-10-CM | POA: Insufficient documentation

## 2022-12-13 LAB — CBC WITH DIFFERENTIAL/PLATELET
Abs Immature Granulocytes: 0.01 10*3/uL (ref 0.00–0.07)
Basophils Absolute: 0.1 10*3/uL (ref 0.0–0.1)
Basophils Relative: 1 %
Eosinophils Absolute: 0.1 10*3/uL (ref 0.0–0.5)
Eosinophils Relative: 2 %
HCT: 49.9 % (ref 39.0–52.0)
Hemoglobin: 16.4 g/dL (ref 13.0–17.0)
Immature Granulocytes: 0 %
Lymphocytes Relative: 49 %
Lymphs Abs: 2.4 10*3/uL (ref 0.7–4.0)
MCH: 27.6 pg (ref 26.0–34.0)
MCHC: 32.9 g/dL (ref 30.0–36.0)
MCV: 83.9 fL (ref 80.0–100.0)
Monocytes Absolute: 0.5 10*3/uL (ref 0.1–1.0)
Monocytes Relative: 11 %
Neutro Abs: 1.9 10*3/uL (ref 1.7–7.7)
Neutrophils Relative %: 37 %
Platelets: 224 10*3/uL (ref 150–400)
RBC: 5.95 MIL/uL — ABNORMAL HIGH (ref 4.22–5.81)
RDW: 12.8 % (ref 11.5–15.5)
WBC: 5 10*3/uL (ref 4.0–10.5)
nRBC: 0 % (ref 0.0–0.2)

## 2022-12-13 LAB — URINALYSIS, ROUTINE W REFLEX MICROSCOPIC
Bacteria, UA: NONE SEEN
Bilirubin Urine: NEGATIVE
Glucose, UA: >=500 mg/dL — AB
Hgb urine dipstick: NEGATIVE
Ketones, ur: NEGATIVE mg/dL
Leukocytes,Ua: NEGATIVE
Nitrite: NEGATIVE
Protein, ur: NEGATIVE mg/dL
Specific Gravity, Urine: 1.025 (ref 1.005–1.030)
pH: 7 (ref 5.0–8.0)

## 2022-12-13 LAB — RAPID URINE DRUG SCREEN, HOSP PERFORMED
Amphetamines: POSITIVE — AB
Barbiturates: NOT DETECTED
Benzodiazepines: NOT DETECTED
Cocaine: NOT DETECTED
Opiates: NOT DETECTED
Tetrahydrocannabinol: NOT DETECTED

## 2022-12-13 LAB — COMPREHENSIVE METABOLIC PANEL
ALT: 15 U/L (ref 0–44)
AST: 20 U/L (ref 15–41)
Albumin: 4.1 g/dL (ref 3.5–5.0)
Alkaline Phosphatase: 85 U/L (ref 38–126)
Anion gap: 11 (ref 5–15)
BUN: 9 mg/dL (ref 6–20)
CO2: 27 mmol/L (ref 22–32)
Calcium: 9.7 mg/dL (ref 8.9–10.3)
Chloride: 93 mmol/L — ABNORMAL LOW (ref 98–111)
Creatinine, Ser: 1.13 mg/dL (ref 0.61–1.24)
GFR, Estimated: >60 mL/min (ref >60)
Glucose, Bld: 345 mg/dL — ABNORMAL HIGH (ref 70–99)
Potassium: 4.7 mmol/L (ref 3.5–5.1)
Sodium: 131 mmol/L — ABNORMAL LOW (ref 135–145)
Total Bilirubin: 1 mg/dL (ref 0.3–1.2)
Total Protein: 8 g/dL (ref 6.5–8.1)

## 2022-12-13 LAB — I-STAT VENOUS BLOOD GAS, ED
Acid-Base Excess: 3 mmol/L — ABNORMAL HIGH (ref 0.0–2.0)
Bicarbonate: 29.3 mmol/L — ABNORMAL HIGH (ref 20.0–28.0)
Calcium, Ion: 1.05 mmol/L — ABNORMAL LOW (ref 1.15–1.40)
HCT: 53 % — ABNORMAL HIGH (ref 39.0–52.0)
Hemoglobin: 18 g/dL — ABNORMAL HIGH (ref 13.0–17.0)
O2 Saturation: 60 %
Potassium: 4.9 mmol/L (ref 3.5–5.1)
Sodium: 130 mmol/L — ABNORMAL LOW (ref 135–145)
TCO2: 31 mmol/L (ref 22–32)
pCO2, Ven: 47.3 mmHg (ref 44–60)
pH, Ven: 7.399 (ref 7.25–7.43)
pO2, Ven: 31 mmHg — CL (ref 32–45)

## 2022-12-13 LAB — CBG MONITORING, ED: Glucose-Capillary: 350 mg/dL — ABNORMAL HIGH (ref 70–99)

## 2022-12-13 LAB — ETHANOL: Alcohol, Ethyl (B): <10 mg/dL (ref <10)

## 2022-12-13 MED ORDER — SODIUM CHLORIDE 0.9 % IV BOLUS
1000.0000 mL | Freq: Once | INTRAVENOUS | Status: AC
  Administered 2022-12-13: 1000 mL via INTRAVENOUS

## 2022-12-13 NOTE — Discharge Instructions (Addendum)
You were seen in the emergency department for elevated blood sugars and feeling weak.  You were given some IV fluids and your blood sugars were improved.  Please continue your medications and follow-up with your primary care doctor.  Return to the emergency department if any worsening or concerning symptoms

## 2022-12-13 NOTE — ED Notes (Signed)
Phlebotomy at bedside Per EDP, pt can be straight stuck and does not need an IV at this time

## 2022-12-13 NOTE — ED Notes (Signed)
Unable to get IV at this time, PT refused after second attempt.

## 2022-12-13 NOTE — ED Notes (Addendum)
Pt ambulated in room, maintained O2 sat on room air 94-96%

## 2022-12-13 NOTE — ED Provider Notes (Signed)
[place] EMERGENCY DEPARTMENT AT [facility] Provider Note   CSN: [id number] Arrival date & time: 12/13/22  1019     History  Chief Complaint  Patient presents with   Hyperglycemia   Altered Mental Status    [name] [name] is a 37 y.o. male.  [place] and elevated blood sugar.  He said he took 80 units of his lantus today.  Cannot afford his other medication. has felt weak and thirsty for the last few days.  He denies any other medical complaints.  He said he feels very tired.  He is homeless.  That he has been unable to afford his medication routinely.  Denies any alcohol or drugs.  The history is provided by the patient and the EMS personnel.  Hyperglycemia Blood sugar level PTA:  425 Progression:  Unchanged Chronicity:  Recurrent Diabetes status:  Controlled with insulin Context: noncompliance   Relieved by:  None tried Ineffective treatments:  None tried Associated symptoms: altered mental status and fatigue   Associated symptoms: no chest pain, no fever, no nausea, no shortness of breath and no vomiting   Altered Mental Status Associated symptoms: no fever, no nausea and no vomiting        Home Medications Prior to Admission medications   Medication Sig Start Date End Date Taking? Authorizing Provider  amLODipine (NORVASC) 10 MG tablet Take 1 tablet (10 mg total) by mouth daily. 11/05/22 12/05/22  Gowens, Mariah L, PA-C  blood glucose meter kit and supplies KIT Dispense based on patient and insurance preference. Use up to four times daily as directed. 11/05/22   Gowens, Mariah L, PA-C  insulin glargine (LANTUS) 100 UNIT/ML Solostar Pen Inject 40 Units into the skin at bedtime. 11/24/22 12/24/22  Gloris Manchester, MD  insulin lispro (HUMALOG) 100 UNIT/ML injection Inject 0.2 mLs (20 Units total) into the skin 3 (three) times daily before meals. 11/24/22 12/24/22  Gloris Manchester, MD  Insulin Pen Needle 32G X 4 MM MISC Use 1 to  give insulin in the morning, at noon, in the evening, and at bedtime. 11/24/22   Gloris Manchester, MD  Lancets MISC Use 1 each in the morning, at noon, in the evening, and at bedtime. 08/14/22   Rocky Morel, DO  lisinopril (ZESTRIL) 10 MG tablet Take 1 tablet (10 mg total) by mouth daily. 11/05/22 12/05/22  Gowens, Lawrence Marseilles, PA-C      Allergies    Patient has no known allergies.    Review of Systems   Review of Systems  Constitutional:  Positive for fatigue. Negative for fever.  Respiratory:  Negative for shortness of breath.   Cardiovascular:  Negative for chest pain.  Gastrointestinal:  Negative for nausea and vomiting.    Physical Exam Updated Vital Signs BP (!) 149/103   Pulse (!) 53   Temp 98.5 F (36.9 C) (Oral)   Resp 14   SpO2 100%  Physical Exam Vitals and nursing note reviewed.  Constitutional:      General: He is not in acute distress.    Appearance: Normal appearance. He is well-developed.  HENT:     Head: Normocephalic and atraumatic.  Eyes:     Conjunctiva/sclera: Conjunctivae normal.  Cardiovascular:     Rate and Rhythm: Normal rate and regular rhythm.     Heart sounds: No murmur heard. Pulmonary:     Effort: Pulmonary effort is normal. No respiratory distress.  Breath sounds: Normal breath sounds.  Abdominal:     Palpations: Abdomen is soft.     Tenderness: There is no abdominal tenderness. There is no guarding or rebound.  Musculoskeletal:        General: No deformity. Normal range of motion.     Cervical back: Neck supple.  Skin:    General: Skin is warm and dry.     Capillary Refill: Capillary refill takes less than 2 seconds.  Neurological:     General: No focal deficit present.     Mental Status: He is alert.     ED Results / Procedures / Treatments   Labs (all labs ordered are listed, but only abnormal results are displayed) Labs Reviewed  COMPREHENSIVE METABOLIC PANEL - Abnormal; Notable for the following components:      Result Value    Sodium 131 (*)    Chloride 93 (*)    Glucose, Bld 345 (*)    All other components within normal limits  CBC WITH DIFFERENTIAL/PLATELET - Abnormal; Notable for the following components:   RBC 5.95 (*)    All other components within normal limits  URINALYSIS, ROUTINE W REFLEX MICROSCOPIC - Abnormal; Notable for the following components:   Glucose, UA >=500 (*)    All other components within normal limits  RAPID URINE DRUG SCREEN, HOSP PERFORMED - Abnormal; Notable for the following components:   Amphetamines POSITIVE (*)    All other components within normal limits  I-STAT VENOUS BLOOD GAS, ED - Abnormal; Notable for the following components:   pO2, Ven 31 (*)    Bicarbonate 29.3 (*)    Acid-Base Excess 3.0 (*)    Sodium 130 (*)    Calcium, Ion 1.05 (*)    HCT 53.0 (*)    Hemoglobin 18.0 (*)    All other components within normal limits  CBG MONITORING, ED - Abnormal; Notable for the following components:   Glucose-Capillary 350 (*)    All other components within normal limits  ETHANOL    EKG EKG Interpretation  Date/Time:  Friday Dec 13 2022 10:30:25 EDT Ventricular Rate:  71 PR Interval:  139 QRS Duration: 110 QT Interval:  411 QTC Calculation: 447 R Axis:   -55 Text Interpretation: Sinus rhythm LAD, consider left anterior fascicular block No significant change since prior 4/24 Confirmed by Meridee Score [id number]) on 12/13/2022 10:48:59 AM  Radiology DG Chest Port 1 View  Result Date: 12/13/2022 CLINICAL DATA:  Shortness of breath. Hyperglycemia. Mental status changes. EXAM: PORTABLE CHEST 1 VIEW COMPARISON:  Two-view chest x-ray 11/05/2022 FINDINGS: The heart size and mediastinal contours are within normal limits. Both lungs are clear. The visualized skeletal structures are unremarkable. IMPRESSION: Negative one view chest x-ray Electronically Signed   By: Marin Roberts M.D.   On: 12/13/2022 17:30    Procedures Procedures    Medications Ordered in  ED Medications  sodium chloride 0.9 % bolus 1,000 mL (0 mLs Intravenous Stopped 12/13/22 1617)    ED Course/ Medical Decision Making/ A&P Clinical Course as of 12/13/22 1808  Fri Dec 13, 2022  1225 Nurses are having trouble getting access or blood work on patient.  They are consulting the IV team. [MB]  1533 Chest x-ray interpreted by me as no acute infiltrate.  Was informed by the nurse that the patient has a new oxygen requirement. [MB]  1559 Patient's sats were good with ambulation.  Does desat when asleep.  Possibly some element of obstructive sleep apnea. [MB]  Clinical Course User Index [MB] Terrilee Files, MD                             Medical Decision Making Amount and/or Complexity of Data Reviewed Labs: ordered. Radiology: ordered.   This patient complains of elevated blood sugars fatigue; this involves an extensive number of treatment Options and is a complaint that carries with it a high risk of complications and morbidity. The differential includes hyperglycemia, DKA, dehydration, infection, medication noncompliance, intoxication  I ordered, reviewed and interpreted labs, which included CBC with normal white count normal hemoglobin, chemistries with elevated glucose normal gap, VBG without acidosis, urinalysis without ketones, tox screen positive for amphetamines I ordered medication IV fluids and reviewed PMP when indicated. I ordered imaging studies which included chest x-ray and I independently    visualized and interpreted imaging which showed no acute findings Additional history obtained from EMS Previous records obtained and reviewed in epic, patient frequently in the ED for elevated blood sugars.  During the last visit case management was able to get patient a voucher for his medications Cardiac monitoring reviewed, sinus rhythm Social determinants considered, patient with housing and transportation insecurity, food insecurity Critical Interventions:  None  After the interventions stated above, I reevaluated the patient and found patient's blood sugars to be trending down and stable on his feet without desaturations Admission and further testing considered, no indications for admission at this time.  Counseled patient to be more compliant with his medications and follow-up with his PCP.  Return instructions discussed.         Final Clinical Impression(s) / ED Diagnoses Final diagnoses:  Hyperglycemia  Resistant hypertension    Rx / DC Orders ED Discharge Orders     None         Terrilee Files, MD 12/13/22 1810

## 2022-12-13 NOTE — ED Triage Notes (Signed)
[place] feeling week for the past two days, worsening today, CBG 425 with PTAR, pt stated:"My body is getting used to the insulin" Hx of diabetes, takes insulin. PT arousable.

## 2022-12-13 NOTE — ED Notes (Signed)
Discharge instructions discussed with pt. Verbalized understanding.No questions or concerns regarding discharge  

## 2022-12-13 NOTE — ED Notes (Signed)
Pt given urinal and advised we need urine sample 

## 2022-12-13 NOTE — ED Notes (Signed)
After questioning pt responded "I took 80 units of LANTUS this morning". Did not specify time.

## 2023-01-01 ENCOUNTER — Observation Stay (HOSPITAL_COMMUNITY): Payer: Self-pay

## 2023-01-01 ENCOUNTER — Encounter (HOSPITAL_COMMUNITY): Payer: Self-pay

## 2023-01-01 ENCOUNTER — Emergency Department (HOSPITAL_COMMUNITY): Payer: Self-pay

## 2023-01-01 ENCOUNTER — Observation Stay (HOSPITAL_COMMUNITY)
Admission: EM | Admit: 2023-01-01 | Discharge: 2023-01-02 | Disposition: A | Discharge status: Home / Self Care | Payer: Self-pay | Attending: Family Medicine | Admitting: Family Medicine

## 2023-01-01 ENCOUNTER — Other Ambulatory Visit: Payer: Self-pay

## 2023-01-01 DIAGNOSIS — I10 Essential (primary) hypertension: Secondary | ICD-10-CM | POA: Insufficient documentation

## 2023-01-01 DIAGNOSIS — N179 Acute kidney failure, unspecified: Secondary | ICD-10-CM | POA: Diagnosis present

## 2023-01-01 DIAGNOSIS — Z794 Long term (current) use of insulin: Secondary | ICD-10-CM | POA: Insufficient documentation

## 2023-01-01 DIAGNOSIS — Z6832 Body mass index (BMI) 32.0-32.9, adult: Secondary | ICD-10-CM | POA: Insufficient documentation

## 2023-01-01 DIAGNOSIS — I152 Hypertension secondary to endocrine disorders: Secondary | ICD-10-CM | POA: Diagnosis present

## 2023-01-01 DIAGNOSIS — R1084 Generalized abdominal pain: Secondary | ICD-10-CM

## 2023-01-01 DIAGNOSIS — E669 Obesity, unspecified: Secondary | ICD-10-CM | POA: Insufficient documentation

## 2023-01-01 DIAGNOSIS — E1065 Type 1 diabetes mellitus with hyperglycemia: Principal | ICD-10-CM | POA: Insufficient documentation

## 2023-01-01 DIAGNOSIS — Z5900 Homelessness unspecified: Secondary | ICD-10-CM

## 2023-01-01 DIAGNOSIS — F17200 Nicotine dependence, unspecified, uncomplicated: Secondary | ICD-10-CM | POA: Insufficient documentation

## 2023-01-01 DIAGNOSIS — E1159 Type 2 diabetes mellitus with other circulatory complications: Secondary | ICD-10-CM | POA: Diagnosis present

## 2023-01-01 DIAGNOSIS — Z720 Tobacco use: Secondary | ICD-10-CM | POA: Insufficient documentation

## 2023-01-01 DIAGNOSIS — Z79899 Other long term (current) drug therapy: Secondary | ICD-10-CM | POA: Insufficient documentation

## 2023-01-01 DIAGNOSIS — R109 Unspecified abdominal pain: Secondary | ICD-10-CM | POA: Diagnosis present

## 2023-01-01 DIAGNOSIS — E1100 Type 2 diabetes mellitus with hyperosmolarity without nonketotic hyperglycemic-hyperosmolar coma (NKHHC): Secondary | ICD-10-CM | POA: Diagnosis present

## 2023-01-01 LAB — CBC WITH DIFFERENTIAL/PLATELET
Abs Immature Granulocytes: 0.01 10*3/uL (ref 0.00–0.07)
Basophils Absolute: 0 10*3/uL (ref 0.0–0.1)
Basophils Relative: 1 %
Eosinophils Absolute: 0.1 10*3/uL (ref 0.0–0.5)
Eosinophils Relative: 2 %
HCT: 42.5 % (ref 39.0–52.0)
Hemoglobin: 13.9 g/dL (ref 13.0–17.0)
Immature Granulocytes: 0 %
Lymphocytes Relative: 32 %
Lymphs Abs: 1.5 10*3/uL (ref 0.7–4.0)
MCH: 28 pg (ref 26.0–34.0)
MCHC: 32.7 g/dL (ref 30.0–36.0)
MCV: 85.5 fL (ref 80.0–100.0)
Monocytes Absolute: 0.5 10*3/uL (ref 0.1–1.0)
Monocytes Relative: 10 %
Neutro Abs: 2.6 10*3/uL (ref 1.7–7.7)
Neutrophils Relative %: 55 %
Platelets: 202 10*3/uL (ref 150–400)
RBC: 4.97 MIL/uL (ref 4.22–5.81)
RDW: 12.6 % (ref 11.5–15.5)
WBC: 4.6 10*3/uL (ref 4.0–10.5)
nRBC: 0 % (ref 0.0–0.2)

## 2023-01-01 LAB — CBG MONITORING, ED
Glucose-Capillary: >600 mg/dL (ref 70–99)
Glucose-Capillary: >600 mg/dL (ref 70–99)
Glucose-Capillary: 241 mg/dL — ABNORMAL HIGH (ref 70–99)
Glucose-Capillary: 433 mg/dL — ABNORMAL HIGH (ref 70–99)

## 2023-01-01 LAB — RAPID URINE DRUG SCREEN, HOSP PERFORMED
Amphetamines: POSITIVE — AB
Barbiturates: NOT DETECTED
Benzodiazepines: NOT DETECTED
Cocaine: NOT DETECTED
Opiates: NOT DETECTED
Tetrahydrocannabinol: NOT DETECTED

## 2023-01-01 LAB — BLOOD GAS, VENOUS
Acid-Base Excess: 1.2 mmol/L (ref 0.0–2.0)
Bicarbonate: 27.6 mmol/L (ref 20.0–28.0)
O2 Saturation: 77.7 %
Patient temperature: 37
pCO2, Ven: 50 mmHg (ref 44–60)
pH, Ven: 7.35 (ref 7.25–7.43)
pO2, Ven: 44 mmHg (ref 32–45)

## 2023-01-01 LAB — MAGNESIUM: Magnesium: 2.1 mg/dL (ref 1.7–2.4)

## 2023-01-01 LAB — CK: Total CK: 193 U/L (ref 49–397)

## 2023-01-01 LAB — COMPREHENSIVE METABOLIC PANEL
ALT: 16 U/L (ref 0–44)
AST: 17 U/L (ref 15–41)
Albumin: 3.7 g/dL (ref 3.5–5.0)
Alkaline Phosphatase: 82 U/L (ref 38–126)
Anion gap: 12 (ref 5–15)
BUN: 14 mg/dL (ref 6–20)
CO2: 25 mmol/L (ref 22–32)
Calcium: 9.1 mg/dL (ref 8.9–10.3)
Chloride: 85 mmol/L — ABNORMAL LOW (ref 98–111)
Creatinine, Ser: 1.46 mg/dL — ABNORMAL HIGH (ref 0.61–1.24)
GFR, Estimated: >60 mL/min (ref >60)
Glucose, Bld: 918 mg/dL (ref 70–99)
Potassium: 4 mmol/L (ref 3.5–5.1)
Sodium: 122 mmol/L — ABNORMAL LOW (ref 135–145)
Total Bilirubin: 1.1 mg/dL (ref 0.3–1.2)
Total Protein: 7.4 g/dL (ref 6.5–8.1)

## 2023-01-01 LAB — URINALYSIS, ROUTINE W REFLEX MICROSCOPIC
Bacteria, UA: NONE SEEN
Bilirubin Urine: NEGATIVE
Glucose, UA: >=500 mg/dL — AB
Hgb urine dipstick: NEGATIVE
Ketones, ur: NEGATIVE mg/dL
Leukocytes,Ua: NEGATIVE
Nitrite: NEGATIVE
Protein, ur: NEGATIVE mg/dL
Specific Gravity, Urine: 1.026 (ref 1.005–1.030)
pH: 6 (ref 5.0–8.0)

## 2023-01-01 LAB — BASIC METABOLIC PANEL
Anion gap: 8 (ref 5–15)
BUN: 11 mg/dL (ref 6–20)
CO2: 25 mmol/L (ref 22–32)
Calcium: 8.7 mg/dL — ABNORMAL LOW (ref 8.9–10.3)
Chloride: 99 mmol/L (ref 98–111)
Creatinine, Ser: 1.03 mg/dL (ref 0.61–1.24)
GFR, Estimated: >60 mL/min (ref >60)
Glucose, Bld: 126 mg/dL — ABNORMAL HIGH (ref 70–99)
Potassium: 3.3 mmol/L — ABNORMAL LOW (ref 3.5–5.1)
Sodium: 132 mmol/L — ABNORMAL LOW (ref 135–145)

## 2023-01-01 LAB — LIPASE, BLOOD: Lipase: 117 U/L — ABNORMAL HIGH (ref 11–51)

## 2023-01-01 LAB — TROPONIN I (HIGH SENSITIVITY): Troponin I (High Sensitivity): 8 ng/L (ref <18)

## 2023-01-01 LAB — ETHANOL: Alcohol, Ethyl (B): <10 mg/dL (ref <10)

## 2023-01-01 LAB — PHOSPHORUS: Phosphorus: 5.4 mg/dL — ABNORMAL HIGH (ref 2.5–4.6)

## 2023-01-01 LAB — GLUCOSE, CAPILLARY: Glucose-Capillary: 130 mg/dL — ABNORMAL HIGH (ref 70–99)

## 2023-01-01 LAB — LACTIC ACID, PLASMA: Lactic Acid, Venous: 1.3 mmol/L (ref 0.5–1.9)

## 2023-01-01 LAB — MRSA NEXT GEN BY PCR, NASAL: MRSA by PCR Next Gen: NOT DETECTED

## 2023-01-01 LAB — BETA-HYDROXYBUTYRIC ACID: Beta-Hydroxybutyric Acid: 0.14 mmol/L (ref 0.05–0.27)

## 2023-01-01 MED ORDER — DEXTROSE IN LACTATED RINGERS 5 % IV SOLN: INTRAVENOUS | Status: DC

## 2023-01-01 MED ORDER — SODIUM CHLORIDE 0.9 % IV SOLN: INTRAVENOUS | Status: DC

## 2023-01-01 MED ORDER — POTASSIUM CHLORIDE 10 MEQ/100ML IV SOLN
10.0000 meq | INTRAVENOUS | Status: AC
  Administered 2023-01-01: 10 meq via INTRAVENOUS
  Filled 2023-01-01: qty 100

## 2023-01-01 MED ORDER — DEXTROSE 50 % IV SOLN: 0.0000 mL | INTRAVENOUS | Status: DC | PRN

## 2023-01-01 MED ORDER — CHLORHEXIDINE GLUCONATE CLOTH 2 % EX PADS
6.0000 | MEDICATED_PAD | Freq: Every day | CUTANEOUS | Status: DC
  Administered 2023-01-01: 6 via TOPICAL

## 2023-01-01 MED ORDER — INSULIN ASPART 100 UNIT/ML IJ SOLN
10.0000 [IU] | Freq: Once | INTRAMUSCULAR | Status: DC
  Filled 2023-01-01: qty 0.1

## 2023-01-01 MED ORDER — NICOTINE 14 MG/24HR TD PT24
14.0000 mg | MEDICATED_PATCH | Freq: Every day | TRANSDERMAL | Status: DC
  Filled 2023-01-01: qty 1

## 2023-01-01 MED ORDER — ORAL CARE MOUTH RINSE: 15.0000 mL | OROMUCOSAL | Status: DC | PRN

## 2023-01-01 MED ORDER — DEXTROSE-SODIUM CHLORIDE 5-0.45 % IV SOLN: INTRAVENOUS | Status: DC

## 2023-01-01 MED ORDER — IOHEXOL 300 MG/ML  SOLN
100.0000 mL | Freq: Once | INTRAMUSCULAR | Status: AC | PRN
  Administered 2023-01-01: 100 mL via INTRAVENOUS

## 2023-01-01 MED ORDER — INSULIN REGULAR(HUMAN) IN NACL 100-0.9 UT/100ML-% IV SOLN
INTRAVENOUS | Status: DC
  Administered 2023-01-02: 0.6 [IU]/h via INTRAVENOUS

## 2023-01-01 MED ORDER — LACTATED RINGERS IV BOLUS: 1000.0000 mL | Freq: Once | INTRAVENOUS | Status: DC

## 2023-01-01 MED ORDER — SODIUM CHLORIDE 0.9 % IV BOLUS
1000.0000 mL | Freq: Once | INTRAVENOUS | Status: AC
  Administered 2023-01-01: 1000 mL via INTRAVENOUS

## 2023-01-01 MED ORDER — LACTATED RINGERS IV SOLN: INTRAVENOUS | Status: DC

## 2023-01-01 MED ORDER — INSULIN REGULAR(HUMAN) IN NACL 100-0.9 UT/100ML-% IV SOLN
INTRAVENOUS | Status: DC
  Administered 2023-01-01: 10.5 [IU]/h via INTRAVENOUS
  Filled 2023-01-01: qty 100

## 2023-01-01 MED ORDER — LACTATED RINGERS IV BOLUS
20.0000 mL/kg | Freq: Once | INTRAVENOUS | Status: AC
  Administered 2023-01-01: 2086 mL via INTRAVENOUS
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
## 2023-01-01 NOTE — Assessment & Plan Note (Signed)
Order lipase, lactic acid and add on CT abd

## 2023-01-01 NOTE — Assessment & Plan Note (Signed)
Obtain electrolytes and rehydrate 
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
## 2023-01-01 NOTE — Assessment & Plan Note (Signed)
Rehydrate agree with insulin drip.  Continue to monitor blood sugars.
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
## 2023-01-01 NOTE — Assessment & Plan Note (Signed)
-   Spoke about importance of quitting spent 5 minutes discussing options for treatment, prior attempts at quitting, and dangers of smoking  -At this point patient is     interested in quitting  - order nicotine patch   - nursing tobacco cessation protocol  

## 2023-01-01 NOTE — Assessment & Plan Note (Signed)
will admit per HHS protocol, obtain serial BMET, start on glucosestabalizer, aggressive IVF.   Change IVF to D5 1/2Na after BG <250 .  So far work up of possible causes of  HSS with CXR, ECG one set of cardiac enzymes,  have been unremarkable  Most likely cause been noncompliance Monitor in Stepdown. Replace potassium as needed.    Consult diabetes coordinator

## 2023-01-01 NOTE — H&P (Signed)
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 place patient in observation at [facility] or [facility] if equivalent level of care is available:: No  Covid Evaluation: Asymptomatic - no recent exposure (last 10 days) testing not required  Diagnosis: Hyperosmolar hyperglycemic state (HHS) [facility]) [[id number]]  Admitting Physician: Therisa Doyne [[id number]]  Attending Physician: Therisa Doyne [[id number]]           Obs    Level of care  stepdown indefinitely please discontinue once patient no longer qualifies COVID-19 Labs     Terryl Niziolek 01/01/2023, 9:06 PM    [facility]     after 2 AM please page floor coverage PA If 7AM-7PM, please contact the day team taking care of the patient using [email or web address]

## 2023-01-01 NOTE — ED Notes (Signed)
Notified RN about CBG reading.

## 2023-01-01 NOTE — Assessment & Plan Note (Signed)
Allow permissive hypertension for tonight hold ACE inhibitor given AKI

## 2023-01-01 NOTE — ED Provider Notes (Signed)
[place] EMERGENCY DEPARTMENT AT [facility] Provider Note   CSN: [id number] Arrival date & time: 01/01/23  1705     History  Chief Complaint  Patient presents with   Hyperglycemia    [name] [name] is a 37 y.o. male.  HPI   37 year old male with past medical history of diabetes, previous noncompliance due to financial issues presents emergency department with concern for hyperglycemia and nausea.  Patient states that he has generalized abdominal pain associated with nausea.  Denies any fever, vomiting or diarrhea.  He states that this is how he typically feels when he is about to go into DKA.  Patient admits that he is been without insulin for the past couple days.  He claims financial issues is the reason for this.  Denies any other acute changes in his health.  Home Medications Prior to Admission medications   Medication Sig Start Date End Date Taking? Authorizing Provider  amLODipine (NORVASC) 10 MG tablet Take 1 tablet (10 mg total) by mouth daily. 11/05/22 12/05/22  Gowens, Mariah L, PA-C  blood glucose meter kit and supplies KIT Dispense based on patient and insurance preference. Use up to four times daily as directed. 11/05/22   Gowens, Mariah L, PA-C  insulin glargine (LANTUS) 100 UNIT/ML Solostar Pen Inject 40 Units into the skin at bedtime. 11/24/22 12/24/22  Gloris Manchester, MD  insulin lispro (HUMALOG) 100 UNIT/ML injection Inject 0.2 mLs (20 Units total) into the skin 3 (three) times daily before meals. 11/24/22 12/24/22  Gloris Manchester, MD  Insulin Pen Needle 32G X 4 MM MISC Use 1 to give insulin in the morning, at noon, in the evening, and at bedtime. 11/24/22   Gloris Manchester, MD  Lancets MISC Use 1 each in the morning, at noon, in the evening, and at bedtime. 08/14/22   Rocky Morel, DO  lisinopril (ZESTRIL) 10 MG tablet Take 1 tablet (10 mg total) by mouth daily. 11/05/22 12/05/22  Gowens, Lawrence [place]se[place], PA-C      Allergies    Patient has no known allergies.    Review of  Systems   Review of Systems  Constitutional:  Positive for appetite change and fatigue. Negative for fever.  Respiratory:  Negative for chest tightness and shortness of breath.   Cardiovascular:  Negative for chest pain, palpitations and leg swelling.  Gastrointestinal:  Positive for abdominal pain and nausea. Negative for diarrhea and vomiting.  Skin:  Negative for rash.  Neurological:  Negative for headaches.    Physical Exam Updated Vital Signs BP (!) 151/103   Pulse 83   Temp 97.8 F (36.6 C)   Resp 15   Ht 5\' 11"  (1.803 m)   Wt 104.3 kg   SpO2 100%   BMI 32.08 kg/m  Physical Exam Vitals and nursing note reviewed.  Constitutional:      General: He is not in acute distress.    Appearance: Normal appearance. He is ill-appearing.  HENT:     Head: Normocephalic.     Mouth/Throat:     Mouth: Mucous membranes are moist.  Cardiovascular:     Rate and Rhythm: Normal rate.  Pulmonary:     Effort: Pulmonary effort is normal. No respiratory distress.  Abdominal:     Palpations: Abdomen is soft.     Tenderness: There is no abdominal tenderness.  Skin:    General: Skin is warm.  Neurological:     Mental Status: He is alert and oriented to person, place, and time. Mental  status is at baseline.  Psychiatric:        Mood and Affect: Mood normal.     ED Results / Procedures / Treatments   Labs (all labs ordered are listed, but only abnormal results are displayed) Labs Reviewed  CBG MONITORING, ED - Abnormal; Notable for the following components:      Result Value   Glucose-Capillary >600 (*)    All other components within normal limits  CBC WITH DIFFERENTIAL/PLATELET  COMPREHENSIVE METABOLIC PANEL  BETA-HYDROXYBUTYRIC ACID  BLOOD GAS, VENOUS    EKG None  Radiology No results found.  Procedures .Critical Care  Performed by: Rozelle Logan, DO Authorized by: Rozelle Logan, DO   Critical care provider statement:    Critical care time (minutes):  45    Critical care was necessary to treat or prevent imminent or life-threatening deterioration of the following conditions:  Endocrine crisis and dehydration   Critical care was time spent personally by me on the following activities:  Development of treatment plan with patient or surrogate, discussions with consultants, evaluation of patient's response to treatment, examination of patient, ordering and review of laboratory studies, ordering and review of radiographic studies, ordering and performing treatments and interventions, pulse oximetry, re-evaluation of patient's condition and review of old charts   I assumed direction of critical care for this patient from another provider in my specialty: no     Care discussed with: admitting provider       Medications Ordered in ED Medications  sodium chloride 0.9 % bolus 1,000 mL (has no administration in time range)    ED Course/ Medical Decision Making/ A&P                             Medical Decision Making Amount and/or Complexity of Data Reviewed Labs: ordered.  Risk Prescription drug management.   37 year old male to his medical history of diabetes presents emergency department after couple days without his insulin.  Patient has been seen multiple times before with insulin noncompliance and elevated blood sugars/DKA.  Patient is altered and sleepy on arrival, denies drinking any alcohol or taking any other drugs.  Vitals are stable.  He is easily arousable but otherwise does not continue conversation.  Blood work is showing a glucose of over 900, potassium is 4.  Anion gap is 12, no significant acidosis.  Does not appear to be DKA today but looks concerning for HHS.  Will plan to treat as such with IV fluids/insulin and potassium replacement.  On reevaluation after 1 L of fluid the blood sugar continues to be greater than 600 on fingerstick.  He seems more awake.  Abdomen is mildly diffusely tender but otherwise benign.  Patients  evaluation and results requires admission for further treatment and care.  Spoke with hospitalist, reviewed patient's ED course and they accept admission.  Request that we add on lipase and CT of the abdomen pelvis as well which has been ordered.  Patient agrees with admission plan, offers no new complaints and is stable/unchanged at time of admit.        Final Clinical Impression(s) / ED Diagnoses Final diagnoses:  None    Rx / DC Orders ED Discharge Orders     None         Rozelle Logan, DO 01/01/23 2020

## 2023-01-01 NOTE — ED Notes (Signed)
Pt resting in bed with no acute distress noted at this time.  

## 2023-01-02 ENCOUNTER — Other Ambulatory Visit: Payer: Self-pay

## 2023-01-02 ENCOUNTER — Other Ambulatory Visit (HOSPITAL_COMMUNITY): Payer: Self-pay

## 2023-01-02 LAB — CBC WITH DIFFERENTIAL/PLATELET
Abs Immature Granulocytes: 0.01 10*3/uL (ref 0.00–0.07)
Basophils Absolute: 0.1 10*3/uL (ref 0.0–0.1)
Basophils Relative: 1 %
Eosinophils Absolute: 0.2 10*3/uL (ref 0.0–0.5)
Eosinophils Relative: 4 %
HCT: 40.2 % (ref 39.0–52.0)
Hemoglobin: 13.8 g/dL (ref 13.0–17.0)
Immature Granulocytes: 0 %
Lymphocytes Relative: 43 %
Lymphs Abs: 1.8 10*3/uL (ref 0.7–4.0)
MCH: 28.2 pg (ref 26.0–34.0)
MCHC: 34.3 g/dL (ref 30.0–36.0)
MCV: 82.2 fL (ref 80.0–100.0)
Monocytes Absolute: 0.5 10*3/uL (ref 0.1–1.0)
Monocytes Relative: 11 %
Neutro Abs: 1.8 10*3/uL (ref 1.7–7.7)
Neutrophils Relative %: 41 %
Platelets: 204 10*3/uL (ref 150–400)
RBC: 4.89 MIL/uL (ref 4.22–5.81)
RDW: 12.7 % (ref 11.5–15.5)
WBC: 4.3 10*3/uL (ref 4.0–10.5)
nRBC: 0 % (ref 0.0–0.2)

## 2023-01-02 LAB — BASIC METABOLIC PANEL
Anion gap: 8 (ref 5–15)
Anion gap: 7 (ref 5–15)
Anion gap: 9 (ref 5–15)
BUN: 11 mg/dL (ref 6–20)
BUN: 10 mg/dL (ref 6–20)
BUN: 13 mg/dL (ref 6–20)
CO2: 24 mmol/L (ref 22–32)
CO2: 24 mmol/L (ref 22–32)
CO2: 24 mmol/L (ref 22–32)
Calcium: 8.3 mg/dL — ABNORMAL LOW (ref 8.9–10.3)
Calcium: 8.3 mg/dL — ABNORMAL LOW (ref 8.9–10.3)
Calcium: 8.6 mg/dL — ABNORMAL LOW (ref 8.9–10.3)
Chloride: 98 mmol/L (ref 98–111)
Chloride: 98 mmol/L (ref 98–111)
Chloride: 97 mmol/L — ABNORMAL LOW (ref 98–111)
Creatinine, Ser: 0.94 mg/dL (ref 0.61–1.24)
Creatinine, Ser: 0.91 mg/dL (ref 0.61–1.24)
Creatinine, Ser: 0.93 mg/dL (ref 0.61–1.24)
GFR, Estimated: >60 mL/min (ref >60)
GFR, Estimated: >60 mL/min (ref >60)
GFR, Estimated: >60 mL/min (ref >60)
Glucose, Bld: 153 mg/dL — ABNORMAL HIGH (ref 70–99)
Glucose, Bld: 108 mg/dL — ABNORMAL HIGH (ref 70–99)
Glucose, Bld: 198 mg/dL — ABNORMAL HIGH (ref 70–99)
Potassium: 3.1 mmol/L — ABNORMAL LOW (ref 3.5–5.1)
Potassium: 3.4 mmol/L — ABNORMAL LOW (ref 3.5–5.1)
Potassium: 3.6 mmol/L (ref 3.5–5.1)
Sodium: 130 mmol/L — ABNORMAL LOW (ref 135–145)
Sodium: 129 mmol/L — ABNORMAL LOW (ref 135–145)
Sodium: 130 mmol/L — ABNORMAL LOW (ref 135–145)

## 2023-01-02 LAB — GLUCOSE, CAPILLARY
Glucose-Capillary: 137 mg/dL — ABNORMAL HIGH (ref 70–99)
Glucose-Capillary: 144 mg/dL — ABNORMAL HIGH (ref 70–99)
Glucose-Capillary: 165 mg/dL — ABNORMAL HIGH (ref 70–99)
Glucose-Capillary: 167 mg/dL — ABNORMAL HIGH (ref 70–99)
Glucose-Capillary: 185 mg/dL — ABNORMAL HIGH (ref 70–99)
Glucose-Capillary: 193 mg/dL — ABNORMAL HIGH (ref 70–99)
Glucose-Capillary: 49 mg/dL — ABNORMAL LOW (ref 70–99)

## 2023-01-02 LAB — MAGNESIUM: Magnesium: 1.7 mg/dL (ref 1.7–2.4)

## 2023-01-02 LAB — PHOSPHORUS: Phosphorus: 3.8 mg/dL (ref 2.5–4.6)

## 2023-01-02 LAB — OSMOLALITY: Osmolality: 284 mOsm/kg (ref 275–295)

## 2023-01-02 LAB — LACTIC ACID, PLASMA: Lactic Acid, Venous: 1.3 mmol/L (ref 0.5–1.9)

## 2023-01-02 LAB — PREALBUMIN: Prealbumin: 17 mg/dL — ABNORMAL LOW (ref 18–38)

## 2023-01-02 LAB — TSH: TSH: 0.744 u[IU]/mL (ref 0.350–4.500)

## 2023-01-02 MED ORDER — "PEN NEEDLES 5/16"" 31G X 8 MM MISC"
1.0000 | Freq: Four times a day (QID) | 1 refills | Status: DC
  Filled 2023-01-02: qty 100, 25d supply, fill #0

## 2023-01-02 MED ORDER — INSULIN ASPART 100 UNIT/ML IJ SOLN
0.0000 [IU] | INTRAMUSCULAR | Status: DC
  Administered 2023-01-02 (×3): 2 [IU] via SUBCUTANEOUS

## 2023-01-02 MED ORDER — NICOTINE 14 MG/24HR TD PT24
14.0000 mg | MEDICATED_PATCH | Freq: Every day | TRANSDERMAL | Status: DC
  Administered 2023-01-02: 14 mg via TRANSDERMAL
  Filled 2023-01-02: qty 1

## 2023-01-02 MED ORDER — INSULIN GLARGINE 100 UNIT/ML SOLOSTAR PEN
40.0000 [IU] | PEN_INJECTOR | Freq: Every day | SUBCUTANEOUS | 3 refills | Status: DC
  Filled 2023-01-02: qty 12, 30d supply, fill #0

## 2023-01-02 MED ORDER — BLOOD GLUCOSE MONITOR KIT
PACK | 0 refills | Status: DC
  Filled 2023-01-02: qty 1, 30d supply, fill #0
  Filled 2023-01-02: qty 1, fill #0

## 2023-01-02 MED ORDER — INSULIN LISPRO (1 UNIT DIAL) 100 UNIT/ML (KWIKPEN)
20.0000 [IU] | PEN_INJECTOR | Freq: Three times a day (TID) | SUBCUTANEOUS | 3 refills | Status: DC
  Filled 2023-01-02 (×2): qty 18, 30d supply, fill #0

## 2023-01-02 MED ORDER — POTASSIUM CHLORIDE 10 MEQ/100ML IV SOLN
10.0000 meq | INTRAVENOUS | Status: AC
  Administered 2023-01-02 (×4): 10 meq via INTRAVENOUS
  Filled 2023-01-02 (×4): qty 100

## 2023-01-02 MED ORDER — INSULIN GLARGINE-YFGN 100 UNIT/ML ~~LOC~~ SOLN
40.0000 [IU] | Freq: Every day | SUBCUTANEOUS | Status: DC
  Administered 2023-01-02: 40 [IU] via SUBCUTANEOUS
  Filled 2023-01-02 (×2): qty 0.4

## 2023-01-02 MED ORDER — NICOTINE 14 MG/24HR TD PT24
14.0000 mg | MEDICATED_PATCH | Freq: Every day | TRANSDERMAL | 0 refills | Status: DC
  Filled 2023-01-02: qty 28, 28d supply, fill #0

## 2023-01-02 MED ORDER — LANCETS MISC
1.0000 | Freq: Four times a day (QID) | 1 refills | Status: DC
  Filled 2023-01-02 (×2): qty 100, 25d supply, fill #0

## 2023-01-02 MED ORDER — DEXTROSE IN LACTATED RINGERS 5 % IV SOLN: INTRAVENOUS | Status: DC

## 2023-01-02 NOTE — Discharge Summary (Signed)
Physician Discharge Summary  [name] [name] ZOX:[id number] DOB: October 30, 1985 DOA: 01/01/2023  PCP: [name], NP  Admit date: 01/01/2023 Discharge date: 01/02/2023  Time spent: 47 minutes  Recommendations for Outpatient Follow-up:  Needs PCP Needs labs in 1 week Meds refilled to her outpatient pharmacy  Discharge Diagnoses:  MAIN problem for hospitalization   Mild DKA on admission  Please see below for itemized issues addressed in HOpsital- refer to other progress notes for clarity if needed  Discharge Condition: Good if he complies  Diet recommendation: Diabetic if he complies  Filed Weights   01/01/23 1723  Weight: 104.3 kg    History of present illness:  37 year old homeless black male originally from [place] moved here about 2 to 3 years ago-polysubstance abuse last use IV drugs/cocaine about 1 year ago Known type I diabetic Known mild chronic hyponatremia HTN Possible OHSS based on habitus never diagnosed with sleep apnea Incomplete LBBB Seborrheic keratosis previously  Lab stay A1c in the system was 11.8--- he has had 8 ED visits and seems to be using the emergency room as primary care as he does not have a doctor His last ED visit was 12/13/2022 he was hyperglycemic given a bolus of fluids chest x-ray was negative glucose was 345 and he was discharged home  Represented 6/5 after shopping at [facility] with intense abdominal pain--- had missed several weeks of insulin Post was 900 He still uses methamphetamines  He was admitted and his gap rapidly closed  CT abdomen pelvis was negative for any abnormality, TSH within normal limits Mild azotemia rapidly cleared and he was placed back on his lisinopril for renal protection as he is a type I diabetic and he was recommended to get meds from the [facility] He is homeless, he does not like [facility] and I have asked TOC look in on him  We talked quite a bit about his habits-he shares that he is from [place]  and does not have any close connection with family presumably because of his habits-I have encouraged him to reach out to family and make a durable plan to quit and I assured him that I would call in a 65-month supply of his meds so he does not run out again  He is stable from my perspective to DC but will need labs in 1 week given he is on lisinopril   Discharge Exam: Vitals:   01/02/23 0633 01/02/23 0800  BP: (!) 156/109   Pulse: 86   Resp: 14   Temp:  98 F (36.7 C)  SpO2: 100%     Subj on day of d/c   Sleepy but arouses readily no distress  General Exam on discharge  EOMI NCAT no focal deficit Neck soft supple Slightly disheveled Chest clear S1-S2 sinus Abdomen soft No lower extremity edema although he does have hammertoes  Discharge Instructions   Discharge Instructions     Diet - low sodium heart healthy   Complete by: As directed    Discharge instructions   Complete by: As directed    I would recommend that you take all of your meds and do not run out of your insulin again-we will try to get the match program for you to get you a supply of medications You will need to present yourself to a homeless shelter and make some serious decisions about whether you want to remain healthy versus continue down the path that you are on-I would advocate strongly for you to quit smoking  do well with your diet and rehabilitate yourself to get yourself up and at [facility] I think you can do it Take care of yourself and try to watch your sugars etc.   Increase activity slowly   Complete by: As directed       Allergies as of 01/02/2023   No Known Allergies      Medication List     STOP taking these medications    amLODipine 10 MG tablet Commonly known as: NORVASC   insulin glargine-yfgn 100 UNIT/ML Pen Commonly known as: SEMGLEE       TAKE these medications    blood glucose meter kit and supplies Kit Dispense based on patient and insurance preference. Use up to four  times daily as directed.   insulin glargine 100 UNIT/ML Solostar Pen Commonly known as: LANTUS Inject 40 Units into the skin at bedtime.   insulin lispro 100 UNIT/ML injection Commonly known as: HumaLOG Inject 0.2 mLs (20 Units total) into the skin 3 (three) times daily before meals. What changed: Another medication with the same name was removed. Continue taking this medication, and follow the directions you see here.   Insulin Pen Needle 32G X 4 MM Misc Use 1 to give insulin in the morning, at noon, in the evening, and at bedtime.   Lancets Misc Use 1 each in the morning, at noon, in the evening, and at bedtime.   lisinopril 10 MG tablet Commonly known as: ZESTRIL Take 1 tablet (10 mg total) by mouth daily.   nicotine 14 mg/24hr patch Commonly known as: NICODERM CQ - dosed in mg/24 hours Place 1 patch (14 mg total) onto the skin daily.       No Known Allergies    The results of significant diagnostics from this hospitalization (including imaging, microbiology, ancillary and laboratory) are listed below for reference.    Significant Diagnostic Studies: CT ABDOMEN PELVIS W CONTRAST  Result Date: 01/01/2023 CLINICAL DATA:  Acute abdominal pain EXAM: CT ABDOMEN AND PELVIS WITH CONTRAST TECHNIQUE: Multidetector CT imaging of the abdomen and pelvis was performed using the standard protocol following bolus administration of intravenous contrast. RADIATION DOSE REDUCTION: This exam was performed according to the departmental dose-optimization program which includes automated exposure control, adjustment of the mA and/or kV according to patient size and/or use of iterative reconstruction technique. CONTRAST:  OMNIPAQUE IOHEXOL 300 MG/ML  SOLN COMPARISON:  02/12/2020 FINDINGS: Lower chest: No acute abnormality. Hepatobiliary: No focal liver abnormality is seen. No gallstones, gallbladder wall thickening, or biliary dilatation. Pancreas: Unremarkable. No pancreatic ductal dilatation  or surrounding inflammatory changes. Spleen: Normal in size without focal abnormality. Adrenals/Urinary Tract: Adrenal glands are within normal limits. Kidneys are well visualized bilaterally. No renal calculi or obstructive changes are noted. The bladder is well distended. Stomach/Bowel: No obstructive or inflammatory changes of the colon are seen. Appendix is well visualized. No inflammatory changes to suggest appendicitis are noted. Small bowel and stomach are unremarkable. Vascular/Lymphatic: No significant vascular findings are present. No enlarged abdominal or pelvic lymph nodes. Reproductive: Prostate is unremarkable. Other: No abdominal wall hernia or abnormality. No abdominopelvic ascites. Musculoskeletal: No acute or significant osseous findings. IMPRESSION: No acute abnormality identified to correspond with the given clinical history. Electronically Signed   By: Alcide Clever M.D.   On: 01/01/2023 21:42   DG CHEST PORT 1 VIEW  Result Date: 01/01/2023 CLINICAL DATA:  DKA EXAM: PORTABLE CHEST 1 VIEW COMPARISON:  Chest x-ray 12/13/2022 FINDINGS: The heart size and mediastinal contours are  within normal limits. Both lungs are clear. The visualized skeletal structures are unremarkable. IMPRESSION: No active disease. Electronically Signed   By: Darliss Cheney M.D.   On: 01/01/2023 20:24   DG Chest Port 1 View  Result Date: 12/13/2022 CLINICAL DATA:  Shortness of breath. Hyperglycemia. Mental status changes. EXAM: PORTABLE CHEST 1 VIEW COMPARISON:  Two-view chest x-ray 11/05/2022 FINDINGS: The heart size and mediastinal contours are within normal limits. Both lungs are clear. The visualized skeletal structures are unremarkable. IMPRESSION: Negative one view chest x-ray Electronically Signed   By: Marin Roberts M.D.   On: 12/13/2022 17:30    Microbiology: Recent Results (from the past 240 hour(s))  MRSA Next Gen by PCR, Nasal     Status: None   Collection Time: 01/01/23 10:25 PM   Specimen:  Nasal Mucosa; Nasal Swab  Result Value Ref Range Status   MRSA by PCR Next Gen NOT DETECTED NOT DETECTED Final    Comment: (NOTE) The GeneXpert MRSA Assay (FDA approved for NASAL specimens only), is one component of a comprehensive MRSA colonization surveillance program. It is not intended to diagnose MRSA infection nor to guide or monitor treatment for MRSA infections. Test performance is not FDA approved in patients less than 69 years old. Performed at [facility], [address] [place], [place] [address]      Labs: Basic Metabolic Panel: Recent Labs  Lab 01/01/23 1800 01/01/23 2245 01/02/23 0314 01/02/23 0652  NA 122* 132* 130* 129*  K 4.0 3.3* 3.1* 3.4*  CL 85* 99 98 98  CO2 25 25 24 24   GLUCOSE 918* 126* 153* 108*  BUN 14 11 11 10   CREATININE 1.46* 1.03 0.94 0.91  CALCIUM 9.1 8.7* 8.3* 8.3*  MG 2.1  --  1.7  --   PHOS 5.4*  --  3.8  --    Liver Function Tests: Recent Labs  Lab 01/01/23 1800  AST 17  ALT 16  ALKPHOS 82  BILITOT 1.1  PROT 7.4  ALBUMIN 3.7   Recent Labs  Lab 01/01/23 1800  LIPASE 117*   No results for input(s): "AMMONIA" in the last 168 hours. CBC: Recent Labs  Lab 01/01/23 1800 01/02/23 0314  WBC 4.6 4.3  NEUTROABS 2.6 1.8  HGB 13.9 13.8  HCT 42.5 40.2  MCV 85.5 82.2  PLT 202 204   Cardiac Enzymes: Recent Labs  Lab 01/01/23 1800  CKTOTAL 193   BNP: BNP (last 3 results) No results for input(s): "BNP" in the last 8760 hours.  ProBNP (last 3 results) No results for input(s): "PROBNP" in the last 8760 hours.  CBG: Recent Labs  Lab 01/02/23 0017 01/02/23 0119 01/02/23 0342 01/02/23 0441 01/02/23 0754  GLUCAP 144* 167* 165* 193* 49*       Signed:  Rhetta Mura MD   [facility] 01/02/2023, 9:27 AM
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
## 2023-01-02 NOTE — TOC Transition Note (Signed)
Transition of Care [facility]) - CM/SW Discharge Note   Patient Details  Name: [name] [name] MRN: [id number] Date of Birth: 18-Aug-1985  [facility] of [facility]) CM/SW Contact:  Amada Jupiter, LCSW Phone Number: 01/02/2023, 11:05 AM   Clinical Narrative:     Met with pt to review dc assistance needs.  Pt confirms he is currently homeless and is already connected with the [facility] but does not like to stay there "because of all the drama".  I have provided additional shelter resources on the AVS so pt can attempt to secure alternate shelter arrangements.  PCP listed for pt is [name], NP who follows individuals at [facility].  Pt would prefer to be connected to a [facility] clinic and I have secured a new patient appt for him at [facility] - 6/12 @ 9:20am (on AVS).  Pt does have medication coverage (per pharmacy dept) and meds are being filled at [facility].  Have provided pt with 2 bus passes for discharge transportation.  Anticipate dc today.  Final next level of care: Homeless Shelter Barriers to Discharge: Barriers Resolved   Patient Goals and CMS Choice      Discharge Placement                         Discharge Plan and Services Additional resources added to the After Visit Summary for                  DME Arranged: N/A DME Agency: NA                  Social Determinants of Health (SDOH) Interventions SDOH Screenings   Food Insecurity: Food Insecurity Present (08/12/2022)  Housing: High Risk (08/12/2022)  Transportation Needs: Unmet Transportation Needs (08/12/2022)  Utilities: Not At Risk (08/12/2022)  Tobacco Use: Unknown (01/01/2023)     Readmission Risk Interventions     No data to display

## 2023-01-02 NOTE — Inpatient Diabetes Management (Signed)
Pt states he will be able to get a glucose meter and supplies at [facility] after discharge. Instructed that he can purchase the [employer] brand, ReliOn, over the counter, without a prescription. Pt states he will go to [place] today. Stressed importance of monitoring his blood sugars at least 3x/day while on insulin. Reviewed hypoglycemia s/s and treatment. Pt voices understanding.   Thank you. Ailene Ards, RD, LDN, CDCES Inpatient Diabetes Coordinator [phone]

## 2023-01-03 LAB — HEMOGLOBIN A1C
Hgb A1c MFr Bld: 15 % — ABNORMAL HIGH (ref 4.8–5.6)
Mean Plasma Glucose: 384 mg/dL

## 2023-01-06 ENCOUNTER — Emergency Department (HOSPITAL_COMMUNITY): Payer: Self-pay

## 2023-01-06 ENCOUNTER — Emergency Department (HOSPITAL_COMMUNITY)
Admission: EM | Admit: 2023-01-06 | Discharge: 2023-01-06 | Disposition: A | Discharge status: Home / Self Care | Payer: Self-pay | Attending: Emergency Medicine | Admitting: Emergency Medicine

## 2023-01-06 ENCOUNTER — Other Ambulatory Visit: Payer: Self-pay

## 2023-01-06 DIAGNOSIS — I10 Essential (primary) hypertension: Secondary | ICD-10-CM | POA: Insufficient documentation

## 2023-01-06 DIAGNOSIS — R400 Somnolence: Secondary | ICD-10-CM

## 2023-01-06 DIAGNOSIS — E162 Hypoglycemia, unspecified: Secondary | ICD-10-CM

## 2023-01-06 DIAGNOSIS — Z79899 Other long term (current) drug therapy: Secondary | ICD-10-CM | POA: Insufficient documentation

## 2023-01-06 DIAGNOSIS — E11649 Type 2 diabetes mellitus with hypoglycemia without coma: Secondary | ICD-10-CM | POA: Insufficient documentation

## 2023-01-06 DIAGNOSIS — Z794 Long term (current) use of insulin: Secondary | ICD-10-CM | POA: Insufficient documentation

## 2023-01-06 DIAGNOSIS — R4182 Altered mental status, unspecified: Secondary | ICD-10-CM | POA: Insufficient documentation

## 2023-01-06 LAB — URINALYSIS, ROUTINE W REFLEX MICROSCOPIC
Bacteria, UA: NONE SEEN
Bilirubin Urine: NEGATIVE
Glucose, UA: 150 mg/dL — AB
Ketones, ur: NEGATIVE mg/dL
Leukocytes,Ua: NEGATIVE
Nitrite: NEGATIVE
Protein, ur: NEGATIVE mg/dL
Specific Gravity, Urine: 1.026 (ref 1.005–1.030)
pH: 5 (ref 5.0–8.0)

## 2023-01-06 LAB — RAPID URINE DRUG SCREEN, HOSP PERFORMED
Amphetamines: POSITIVE — AB
Barbiturates: NOT DETECTED
Benzodiazepines: NOT DETECTED
Cocaine: NOT DETECTED
Opiates: NOT DETECTED
Tetrahydrocannabinol: NOT DETECTED

## 2023-01-06 LAB — COMPREHENSIVE METABOLIC PANEL
ALT: 20 U/L (ref 0–44)
AST: 30 U/L (ref 15–41)
Albumin: 3.3 g/dL — ABNORMAL LOW (ref 3.5–5.0)
Alkaline Phosphatase: 60 U/L (ref 38–126)
Anion gap: 9 (ref 5–15)
BUN: 10 mg/dL (ref 6–20)
CO2: 24 mmol/L (ref 22–32)
Calcium: 8.7 mg/dL — ABNORMAL LOW (ref 8.9–10.3)
Chloride: 105 mmol/L (ref 98–111)
Creatinine, Ser: 0.87 mg/dL (ref 0.61–1.24)
GFR, Estimated: >60 mL/min (ref >60)
Glucose, Bld: 79 mg/dL (ref 70–99)
Potassium: 3.4 mmol/L — ABNORMAL LOW (ref 3.5–5.1)
Sodium: 138 mmol/L (ref 135–145)
Total Bilirubin: 0.5 mg/dL (ref 0.3–1.2)
Total Protein: 6.9 g/dL (ref 6.5–8.1)

## 2023-01-06 LAB — CBC WITH DIFFERENTIAL/PLATELET
Abs Immature Granulocytes: 0.02 10*3/uL (ref 0.00–0.07)
Basophils Absolute: 0 10*3/uL (ref 0.0–0.1)
Basophils Relative: 1 %
Eosinophils Absolute: 0.1 10*3/uL (ref 0.0–0.5)
Eosinophils Relative: 1 %
HCT: 39.9 % (ref 39.0–52.0)
Hemoglobin: 13.2 g/dL (ref 13.0–17.0)
Immature Granulocytes: 0 %
Lymphocytes Relative: 30 %
Lymphs Abs: 1.8 10*3/uL (ref 0.7–4.0)
MCH: 27.8 pg (ref 26.0–34.0)
MCHC: 33.1 g/dL (ref 30.0–36.0)
MCV: 84.2 fL (ref 80.0–100.0)
Monocytes Absolute: 0.6 10*3/uL (ref 0.1–1.0)
Monocytes Relative: 9 %
Neutro Abs: 3.6 10*3/uL (ref 1.7–7.7)
Neutrophils Relative %: 59 %
Platelets: 201 10*3/uL (ref 150–400)
RBC: 4.74 MIL/uL (ref 4.22–5.81)
RDW: 13 % (ref 11.5–15.5)
WBC: 6.1 10*3/uL (ref 4.0–10.5)
nRBC: 0 % (ref 0.0–0.2)

## 2023-01-06 LAB — TSH: TSH: 1.169 u[IU]/mL (ref 0.350–4.500)

## 2023-01-06 LAB — ETHANOL: Alcohol, Ethyl (B): <10 mg/dL (ref <10)

## 2023-01-06 LAB — CBG MONITORING, ED
Glucose-Capillary: 52 mg/dL — ABNORMAL LOW (ref 70–99)
Glucose-Capillary: 53 mg/dL — ABNORMAL LOW (ref 70–99)
Glucose-Capillary: 73 mg/dL (ref 70–99)
Glucose-Capillary: 80 mg/dL (ref 70–99)

## 2023-01-06 LAB — BLOOD GAS, VENOUS
Acid-base deficit: 10 mmol/L — ABNORMAL HIGH (ref 0.0–2.0)
Bicarbonate: 14.8 mmol/L — ABNORMAL LOW (ref 20.0–28.0)
O2 Saturation: 99.5 %
Patient temperature: 37
pCO2, Ven: 28 mmHg — ABNORMAL LOW (ref 44–60)
pH, Ven: 7.33 (ref 7.25–7.43)
pO2, Ven: 102 mmHg — ABNORMAL HIGH (ref 32–45)

## 2023-01-06 LAB — LIPASE, BLOOD: Lipase: 53 U/L — ABNORMAL HIGH (ref 11–51)

## 2023-01-06 MED ORDER — SODIUM CHLORIDE 0.9 % IV BOLUS
500.0000 mL | Freq: Once | INTRAVENOUS | Status: AC
  Administered 2023-01-06: 500 mL via INTRAVENOUS
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
## 2023-01-06 NOTE — ED Notes (Signed)
Patient did not want to take his vitals

## 2023-01-06 NOTE — ED Provider Notes (Signed)
[place] EMERGENCY DEPARTMENT AT [facility] Provider Note   CSN: [id number] Arrival date & time: 01/06/23  1307     History  Chief Complaint  Patient presents with   Hypoglycemia    41 CBG    [name] [name] is a 37 y.o. male.  The history is provided by the patient and medical records. No language interpreter was used.  Hypoglycemia Initial blood sugar:  40 per EMS Blood sugar after intervention:  80's Severity:  Unable to specify Onset quality:  Unable to specify Timing:  Constant Progression:  Improving Chronicity:  New Diabetic status:  Controlled with insulin Context: not ingestion and not recent illness   Relieved by:  Nothing Ineffective treatments:  None tried Associated symptoms: altered mental status and decreased responsiveness   Associated symptoms: no dizziness, no seizures, no shortness of breath, no speech difficulty, no sweats, no visual change, no vomiting and no weakness        Home Medications Prior to Admission medications   Medication Sig Start Date End Date Taking? Authorizing Provider  blood glucose meter kit and supplies KIT Use up to four times daily as directed to test blood sugar 01/02/23   Rhetta Mura, MD  glucose blood test strip Use 1 each in the morning, at noon, in the evening, and at bedtime. 11/05/22 01/27/23  Gowens, Mariah L, PA-C  insulin glargine (LANTUS) 100 UNIT/ML Solostar Pen Inject 40 Units into the skin at bedtime. 01/02/23 05/02/23  Rhetta Mura, MD  insulin lispro (HUMALOG) 100 UNIT/ML KwikPen Inject 20 Units into the skin 3 (three) times daily before meals. 01/02/23 05/02/23  Rhetta Mura, MD  Insulin Pen Needle (PEN NEEDLES 31GX5/16") 31G X 8 MM MISC Use to inject insulin in the morning, at noon, in the evening, and at bedtime. 01/02/23   Rhetta Mura, MD  Lancets MISC Use 1 each in the morning, at noon, in the evening, and at bedtime. 01/02/23   Rhetta Mura, MD  lisinopril (ZESTRIL)  10 MG tablet Take 1 tablet (10 mg total) by mouth daily. 11/05/22 01/02/23  Gowens, Mariah L, PA-C  nicotine (NICODERM CQ - DOSED IN MG/24 HOURS) 14 mg/24hr patch Place 1 patch (14 mg total) onto the skin daily. 01/02/23   Rhetta Mura, MD      Allergies    Patient has no known allergies.    Review of Systems   Review of Systems  Constitutional:  Positive for decreased responsiveness and fatigue. Negative for chills, diaphoresis and fever.  HENT:  Negative for congestion.   Eyes:  Negative for visual disturbance.  Respiratory:  Negative for cough, chest tightness, shortness of breath and wheezing.   Cardiovascular:  Negative for chest pain.  Gastrointestinal:  Negative for abdominal pain, constipation, diarrhea, nausea and vomiting.  Genitourinary:  Negative for dysuria and flank pain.  Musculoskeletal:  Negative for back pain, neck pain and neck stiffness.  Skin:  Negative for rash and wound.  Neurological:  Negative for dizziness, seizures, speech difficulty, weakness, light-headedness and headaches.  Psychiatric/Behavioral:  Positive for confusion. Negative for agitation.   All other systems reviewed and are negative.   Physical Exam Updated Vital Signs BP (!) 172/106   Temp 98 F (36.7 C)   Resp 18  Physical Exam Vitals and nursing note reviewed.  Constitutional:      General: He is not in acute distress.    Appearance: He is well-developed. He is not ill-appearing, toxic-appearing or diaphoretic.     Comments: Patient's  somnolent but arousable with loud speech.  HENT:     Head: Normocephalic and atraumatic.     Nose: No congestion or rhinorrhea.     Mouth/Throat:     Mouth: Mucous membranes are dry.     Pharynx: No oropharyngeal exudate or posterior oropharyngeal erythema.  Eyes:     Extraocular Movements: Extraocular movements intact.     Conjunctiva/sclera: Conjunctivae normal.     Pupils: Pupils are equal, round, and reactive to light.  Cardiovascular:     Rate  and Rhythm: Normal rate and regular rhythm.     Heart sounds: No murmur heard. Pulmonary:     Effort: Pulmonary effort is normal. No respiratory distress.     Breath sounds: Normal breath sounds. No wheezing, rhonchi or rales.  Chest:     Chest wall: No tenderness.  Abdominal:     General: Abdomen is flat. There is no distension.     Palpations: Abdomen is soft.     Tenderness: There is no abdominal tenderness. There is no right CVA tenderness, left CVA tenderness, guarding or rebound.  Musculoskeletal:        General: No swelling or tenderness.     Cervical back: Neck supple. No tenderness.     Right lower leg: No edema.     Left lower leg: No edema.  Skin:    General: Skin is warm and dry.     Capillary Refill: Capillary refill takes less than 2 seconds.     Findings: No erythema or rash.  Neurological:     General: No focal deficit present.     Sensory: No sensory deficit.     Motor: No weakness.  Psychiatric:        Mood and Affect: Mood normal.     ED Results / Procedures / Treatments   Labs (all labs ordered are listed, but only abnormal results are displayed) Labs Reviewed  COMPREHENSIVE METABOLIC PANEL - Abnormal; Notable for the following components:      Result Value   Potassium 3.4 (*)    Calcium 8.7 (*)    Albumin 3.3 (*)    All other components within normal limits  LIPASE, BLOOD - Abnormal; Notable for the following components:   Lipase 53 (*)    All other components within normal limits  RAPID URINE DRUG SCREEN, HOSP PERFORMED - Abnormal; Notable for the following components:   Amphetamines POSITIVE (*)    All other components within normal limits  BLOOD GAS, VENOUS - Abnormal; Notable for the following components:   pCO2, Ven 28 (*)    pO2, Ven 102 (*)    Bicarbonate 14.8 (*)    Acid-base deficit 10.0 (*)    All other components within normal limits  CBG MONITORING, ED - Abnormal; Notable for the following components:   Glucose-Capillary 53 (*)     All other components within normal limits  CBG MONITORING, ED - Abnormal; Notable for the following components:   Glucose-Capillary 52 (*)    All other components within normal limits  CBC WITH DIFFERENTIAL/PLATELET  TSH  ETHANOL  URINALYSIS, ROUTINE W REFLEX MICROSCOPIC  AMMONIA  CBG MONITORING, ED  CBG MONITORING, ED    EKG None  Radiology CT HEAD WO CONTRAST ( )  Result Date: 01/06/2023 CLINICAL DATA:  Altered mental status EXAM: CT HEAD WITHOUT CONTRAST TECHNIQUE: Contiguous axial images were obtained from the base of the skull through the vertex without intravenous contrast. RADIATION DOSE REDUCTION: This exam was performed according to the  departmental dose-optimization program which includes automated exposure control, adjustment of the mA and/or kV according to patient size and/or use of iterative reconstruction technique. COMPARISON:  None Available. FINDINGS: Brain: No evidence of acute infarction, hemorrhage, mass, mass effect, or midline shift. No hydrocephalus or extra-axial fluid collection. Posterior fossa arachnoid cyst. Vascular: No hyperdense vessel. Skull: Negative for fracture or focal lesion. Sinuses/Orbits: Clear paranasal sinuses.  Dysconjugate gaze. Other: The mastoid air cells are well aerated. IMPRESSION: No acute intracranial process. Electronically Signed   By: Wiliam Ke M.D.   On: 01/06/2023 15:21   DG Chest Portable 1 View  Result Date: 01/06/2023 CLINICAL DATA:  Hypoglycemia and altered mental status EXAM: PORTABLE CHEST 1 VIEW COMPARISON:  Chest radiograph dated 01/01/2023 FINDINGS: Normal lung volumes. No focal consolidations. No pleural effusion or pneumothorax. The heart size and mediastinal contours are within normal limits. No acute osseous abnormality. IMPRESSION: No active disease. Electronically Signed   By: Agustin Cree M.D.   On: 01/06/2023 15:13    Procedures Procedures    Medications Ordered in ED Medications  sodium chloride 0.9 % bolus  500 mL (500 mLs Intravenous New Bag/Given 01/06/23 1502)    ED Course/ Medical Decision Making/ A&P                             Medical Decision Making Amount and/or Complexity of Data Reviewed Labs: ordered. Radiology: ordered.    CARTRELL FILARDI is a 37 y.o. male with past medical history significant for diabetes, previous HHS, and hypertension who presents with law enforcement for altered mental status and hypoglycemia.  According to law enforcement, patient was arrested for shoplifting and shortly thereafter said he needed to take some insulin.  He got his bag, did not check his sugar, and then inject himself in the arm with insulin.  Then and route he started getting altered and confused and wanted something sweet.  EMS was called and patient was found to have glucose of 41.  He was given D10 and arrives with altered mental status.  Patient is somnolent but answering questions when he is awakened.  Patient denies any trauma and denies any pain.  He denies any chest pain, headache, neck pain, abdominal pain, or extremity pains.  Denies any trauma.  Denies any nausea, vomiting, constipation, diarrhea, or urinary changes.  He just feels sleepy and falls asleep immediately.  Patient does have small pupils but denies any drug use or ingestion.  Did not note any suspicion for opioid abuse or ingestion.  Patient said that he had not yet had breakfast and took his insulin.  On exam, the patient is very somnolent but will wake up to answer questions.  He still seems slightly confused.  No evidence of acute trauma initially.  Intact sensation and strength in extremities.  Symmetric smile.  Lungs clear and chest nontender.  Abdomen nontender.  Patient has dry mucous membranes.  Patient had a glucose checked and it was improved in the 80s.  Will check it again given his altered mental status.  Will get screening labs will also get chest x-ray and urinalysis to look for occult infection.  Will get CT  head given the altered mental status.  Patient's workup began to return.  CBC reassuring.  Alcohol undetectable.  UDS showed amphetamines but otherwise no opiates seen.  Lipase barely elevated at 53 and metabolic panel showed mild hypokalemia.  Kidney function normal.  LFTs normal.  TSH still in process as is ammonia.  Urinalysis in process otherwise.  Blood gas did not show significant acidosis and pH was 7.33.  CO2 not elevated to cause somnolence.  Will wait for results of CT head and chest x-ray and then will give some fluids and reassess to make sure he improves his mental status.  The patient gets back to baseline is feeling well, anticipate discharge home if he proves that he will not drop with his glucose again.   Workup began to return.  Patient CT head and chest x-ray reassuring.  Patient reassessed and he is now back to baseline and feeling much better.  Glucoses are remaining in the normal range now.   We discussed watching the patient for a longer time but he would like to get discharged.  He is drinking juice and eating peanut butter without difficulty.  Patient will call his doctor to discuss titration of his glucose medications and will follow-up.    Patient agrees with plan of care and was discharged in good condition understanding return precautions.        Final Clinical Impression(s) / ED Diagnoses Final diagnoses:  Hypoglycemia  Somnolence  Altered mental status, unspecified altered mental status type   Clinical Impression: 1. Hypoglycemia   2. Somnolence   3. Altered mental status, unspecified altered mental status type     Disposition: Discharge  Condition: Good  I have discussed the results, Dx and Tx plan with the pt(& family if present). He/she/they expressed understanding and agree(s) with the plan. Discharge instructions discussed at great length. Strict return precautions discussed and pt &/or family have verbalized understanding of the instructions. No  further questions at time of discharge.    New Prescriptions   No medications on file    Follow Up: Hilbert Corrigan [name], NP [address] [place] [address] [phone]     [facility] Emergency Department at [facility] [address] [id number] mc [place] [address] [phone]         Davyon Fisch, Canary Brim, MD 01/06/23 1535

## 2023-01-06 NOTE — Discharge Instructions (Signed)
Your history, exam, evaluation today today were consistent with hypoglycemia likely due to taking the insulin right before coming to the emergency department.  You were watched for several hours and had improvement in your glucoses.  You were able to eat and drink and your workup was otherwise reassuring.  The CT head and chest x-ray did not show evidence of acute injury or acute infection.  Your other labs were overall reassuring and similar or improved to prior.  Given your stability and your well appearance and feeling back to your baseline now, I do feel you are safe for discharge home.  Please hold on taking more diabetic medicines tonight and follow-up with your regular doctor to discuss titration of those medications.  Please rest and stay hydrated.  If any symptoms change or worsen acutely, please return to the nearest emergency department.

## 2023-01-06 NOTE — ED Notes (Signed)
Patient was given juice per protocol ASAP after glucose reading.

## 2023-01-08 ENCOUNTER — Inpatient Hospital Stay: Payer: Self-pay | Admitting: Nurse Practitioner

## 2023-03-05 ENCOUNTER — Encounter (HOSPITAL_COMMUNITY): Payer: Self-pay

## 2023-03-05 ENCOUNTER — Other Ambulatory Visit: Payer: Self-pay

## 2023-03-05 ENCOUNTER — Emergency Department (HOSPITAL_COMMUNITY)
Admission: EM | Admit: 2023-03-05 | Discharge: 2023-03-05 | Disposition: A | Discharge status: Home / Self Care | Payer: Self-pay | Attending: Emergency Medicine | Admitting: Emergency Medicine

## 2023-03-05 DIAGNOSIS — R5383 Other fatigue: Secondary | ICD-10-CM | POA: Insufficient documentation

## 2023-03-05 DIAGNOSIS — E1165 Type 2 diabetes mellitus with hyperglycemia: Secondary | ICD-10-CM | POA: Insufficient documentation

## 2023-03-05 DIAGNOSIS — E860 Dehydration: Secondary | ICD-10-CM | POA: Insufficient documentation

## 2023-03-05 DIAGNOSIS — R42 Dizziness and giddiness: Secondary | ICD-10-CM | POA: Insufficient documentation

## 2023-03-05 DIAGNOSIS — R000 Tachycardia, unspecified: Secondary | ICD-10-CM | POA: Insufficient documentation

## 2023-03-05 DIAGNOSIS — Z794 Long term (current) use of insulin: Secondary | ICD-10-CM | POA: Insufficient documentation

## 2023-03-05 LAB — BASIC METABOLIC PANEL
Anion gap: 13 (ref 5–15)
BUN: 24 mg/dL — ABNORMAL HIGH (ref 6–20)
CO2: 22 mmol/L (ref 22–32)
Calcium: 9.7 mg/dL (ref 8.9–10.3)
Chloride: 100 mmol/L (ref 98–111)
Creatinine, Ser: 1.43 mg/dL — ABNORMAL HIGH (ref 0.61–1.24)
GFR, Estimated: >60 mL/min (ref >60)
Glucose, Bld: 388 mg/dL — ABNORMAL HIGH (ref 70–99)
Potassium: 4.5 mmol/L (ref 3.5–5.1)
Sodium: 135 mmol/L (ref 135–145)

## 2023-03-05 LAB — CBC WITH DIFFERENTIAL/PLATELET
Abs Immature Granulocytes: 0.01 10*3/uL (ref 0.00–0.07)
Basophils Absolute: 0 10*3/uL (ref 0.0–0.1)
Basophils Relative: 0 %
Eosinophils Absolute: 0 10*3/uL (ref 0.0–0.5)
Eosinophils Relative: 0 %
HCT: 45.3 % (ref 39.0–52.0)
Hemoglobin: 15.5 g/dL (ref 13.0–17.0)
Immature Granulocytes: 0 %
Lymphocytes Relative: 25 %
Lymphs Abs: 1.7 10*3/uL (ref 0.7–4.0)
MCH: 28.3 pg (ref 26.0–34.0)
MCHC: 34.2 g/dL (ref 30.0–36.0)
MCV: 82.8 fL (ref 80.0–100.0)
Monocytes Absolute: 0.5 10*3/uL (ref 0.1–1.0)
Monocytes Relative: 7 %
Neutro Abs: 4.4 10*3/uL (ref 1.7–7.7)
Neutrophils Relative %: 68 %
Platelets: 198 10*3/uL (ref 150–400)
RBC: 5.47 MIL/uL (ref 4.22–5.81)
RDW: 13 % (ref 11.5–15.5)
WBC: 6.6 10*3/uL (ref 4.0–10.5)
nRBC: 0 % (ref 0.0–0.2)

## 2023-03-05 LAB — CBG MONITORING, ED
Glucose-Capillary: 362 mg/dL — ABNORMAL HIGH (ref 70–99)
Glucose-Capillary: 305 mg/dL — ABNORMAL HIGH (ref 70–99)

## 2023-03-05 MED ORDER — SODIUM CHLORIDE 0.9 % IV BOLUS
1000.0000 mL | Freq: Once | INTRAVENOUS | Status: AC
  Administered 2023-03-05: 1000 mL via INTRAVENOUS

## 2023-03-05 NOTE — Discharge Instructions (Signed)
Your blood work showed that you were pretty dehydrated.  Please follow-up with your family doctor to get this rechecked hopefully within the week.  Try to eat and drink as well as you can for the next few days.

## 2023-03-05 NOTE — ED Provider Notes (Signed)
[place] EMERGENCY DEPARTMENT AT [facility] Provider Note   CSN: [id number] Arrival date & time: 03/05/23  0246     History  Chief Complaint  Patient presents with   Dizziness    [name] [name] is a 37 y.o. male.  37 yo M with a chief complaints of not feeling well.  He tells me that he feels a bit lightheaded especially when he tries to get up and move around.  He just got out of prison today.  [place] he was in there for about 50 days and was unable to get his insulin filled today.  He thinks he can get it filled first thing in the morning.  He denies cough congestion or fevers denies chest pain.  Feeling mildly fatigued.  Has been able to eat and drink without issue.   Dizziness      Home Medications Prior to Admission medications   Medication Sig Start Date End Date Taking? Authorizing Provider  blood glucose meter kit and supplies KIT Use up to four times daily as directed to test blood sugar 01/02/23   Rhetta Mura, MD  insulin glargine (LANTUS) 100 UNIT/ML Solostar Pen Inject 40 Units into the skin at bedtime. 01/02/23 05/02/23  Rhetta Mura, MD  insulin lispro (HUMALOG) 100 UNIT/ML KwikPen Inject 20 Units into the skin 3 (three) times daily before meals. 01/02/23 05/02/23  Rhetta Mura, MD  Insulin Pen Needle (PEN NEEDLES 31GX5/16") 31G X 8 MM MISC Use to inject insulin in the morning, at noon, in the evening, and at bedtime. 01/02/23   Rhetta Mura, MD  Lancets MISC Use 1 each in the morning, at noon, in the evening, and at bedtime. 01/02/23   Rhetta Mura, MD  lisinopril (ZESTRIL) 10 MG tablet Take 1 tablet (10 mg total) by mouth daily. 11/05/22 01/02/23  Gowens, Mariah L, PA-C  nicotine (NICODERM CQ - DOSED IN MG/24 HOURS) 14 mg/24hr patch Place 1 patch (14 mg total) onto the skin daily. 01/02/23   Rhetta Mura, MD      Allergies    Patient has no known allergies.    Review of Systems   Review of Systems  Neurological:   Positive for dizziness.    Physical Exam Updated Vital Signs BP (!) 162/93 (BP Location: Right Arm)   Pulse (!) 110   Temp 99.3 F (37.4 C) (Oral)   Resp 16   SpO2 100%  Physical Exam Vitals and nursing note reviewed.  Constitutional:      Appearance: He is well-developed.  HENT:     Head: Normocephalic and atraumatic.  Eyes:     Pupils: Pupils are equal, round, and reactive to light.  Neck:     Vascular: No JVD.  Cardiovascular:     Rate and Rhythm: Regular rhythm. Tachycardia present.     Heart sounds: No murmur heard.    No friction rub. No gallop.  Pulmonary:     Effort: No respiratory distress.     Breath sounds: No wheezing.  Abdominal:     General: There is no distension.     Tenderness: There is no abdominal tenderness. There is no guarding or rebound.  Musculoskeletal:        General: Normal range of motion.     Cervical back: Normal range of motion and neck supple.  Skin:    Coloration: Skin is not pale.     Findings: No rash.  Neurological:     Mental Status: He is alert and oriented  to person, place, and time.  Psychiatric:        Behavior: Behavior normal.     ED Results / Procedures / Treatments   Labs (all labs ordered are listed, but only abnormal results are displayed) Labs Reviewed  BASIC METABOLIC PANEL - Abnormal; Notable for the following components:      Result Value   Glucose, Bld 388 (*)    BUN 24 (*)    Creatinine, Ser 1.43 (*)    All other components within normal limits  CBG MONITORING, ED - Abnormal; Notable for the following components:   Glucose-Capillary 362 (*)    All other components within normal limits  CBC WITH DIFFERENTIAL/PLATELET  URINALYSIS, ROUTINE W REFLEX MICROSCOPIC    EKG EKG Interpretation Date/Time:  Wednesday March 05 2023 02:54:13 EDT Ventricular Rate:  121 PR Interval:  151 QRS Duration:  109 QT Interval:  328 QTC Calculation: 466 R Axis:   -60  Text Interpretation: Sinus tachycardia LAD,  consider left anterior fascicular block Consider anterior infarct Otherwise no significant change Confirmed by Melene Plan [phone] on 03/05/2023 2:55:45 AM  Radiology No results found.  Procedures Procedures    Medications Ordered in ED Medications  sodium chloride 0.9 % bolus 1,000 mL (1,000 mLs Intravenous New Bag/Given 03/05/23 0306)    ED Course/ Medical Decision Making/ A&P                                 Medical Decision Making Amount and/or Complexity of Data Reviewed Labs: ordered.   37 yo M with a chief complaints of feeling lightheaded.  Patient is a diabetic and unfortunately has not been able to get his insulin filled.  Blood sugar in the 400s with EMS.  Will give a bolus of IV fluids check labs here to assess for diabetic ketoacidosis.  Reassess.  Patient feeling bit better after a bag IV fluids.  Heart rate has improved as well.  Blood work consistent with acute kidney injury.  No significant electrolyte abnormalities.  No metabolic acidosis.  Blood sugar mildly elevated.  Blood sugars come down with IV fluids as well.  Patient like to go home at this time.  Encouraged him to follow-up for recheck of his kidney function within the week.  4:23 AM:  I have discussed the diagnosis/risks/treatment options with the patient.  Evaluation and diagnostic testing in the emergency department does not suggest an emergent condition requiring admission or immediate intervention beyond what has been performed at this time.  They will follow up with PCP. We also discussed returning to the ED immediately if new or worsening sx occur. We discussed the sx which are most concerning (e.g., sudden worsening pain, fever, inability to tolerate by mouth) that necessitate immediate return. Medications administered to the patient during their visit and any new prescriptions provided to the patient are listed below.  Medications given during this visit Medications  sodium chloride 0.9 % bolus 1,000 mL  (1,000 mLs Intravenous New Bag/Given 03/05/23 0306)     The patient appears reasonably screen and/or stabilized for discharge and I doubt any other medical condition or other [facility] requiring further screening, evaluation, or treatment in the ED at this time prior to discharge.          Final Clinical Impression(s) / ED Diagnoses Final diagnoses:  Dehydration    Rx / DC Orders ED Discharge Orders     None  Melene Plan, DO 03/05/23 [phone]

## 2023-03-05 NOTE — ED Triage Notes (Signed)
Dizziness onset 2 hrs ago with elevated blood pressure and glucose.

## 2023-03-24 ENCOUNTER — Other Ambulatory Visit: Payer: Self-pay

## 2023-03-24 ENCOUNTER — Emergency Department (HOSPITAL_COMMUNITY): Payer: Self-pay

## 2023-03-24 ENCOUNTER — Emergency Department (HOSPITAL_COMMUNITY)
Admission: EM | Admit: 2023-03-24 | Discharge: 2023-03-25 | Disposition: A | Discharge status: Home / Self Care | Payer: Self-pay | Attending: Emergency Medicine | Admitting: Emergency Medicine

## 2023-03-24 DIAGNOSIS — Z794 Long term (current) use of insulin: Secondary | ICD-10-CM | POA: Insufficient documentation

## 2023-03-24 DIAGNOSIS — E1165 Type 2 diabetes mellitus with hyperglycemia: Secondary | ICD-10-CM | POA: Insufficient documentation

## 2023-03-24 DIAGNOSIS — R739 Hyperglycemia, unspecified: Secondary | ICD-10-CM

## 2023-03-24 LAB — CBC WITH DIFFERENTIAL/PLATELET
Abs Immature Granulocytes: 0 10*3/uL (ref 0.00–0.07)
Basophils Absolute: 0 10*3/uL (ref 0.0–0.1)
Basophils Relative: 0 %
Eosinophils Absolute: 0.1 10*3/uL (ref 0.0–0.5)
Eosinophils Relative: 1 %
HCT: 44.2 % (ref 39.0–52.0)
Hemoglobin: 15.1 g/dL (ref 13.0–17.0)
Immature Granulocytes: 0 %
Lymphocytes Relative: 38 %
Lymphs Abs: 2 10*3/uL (ref 0.7–4.0)
MCH: 28.2 pg (ref 26.0–34.0)
MCHC: 34.2 g/dL (ref 30.0–36.0)
MCV: 82.5 fL (ref 80.0–100.0)
Monocytes Absolute: 0.5 10*3/uL (ref 0.1–1.0)
Monocytes Relative: 9 %
Neutro Abs: 2.8 10*3/uL (ref 1.7–7.7)
Neutrophils Relative %: 52 %
Platelets: 238 10*3/uL (ref 150–400)
RBC: 5.36 MIL/uL (ref 4.22–5.81)
RDW: 12.4 % (ref 11.5–15.5)
WBC: 5.4 10*3/uL (ref 4.0–10.5)
nRBC: 0 % (ref 0.0–0.2)

## 2023-03-24 LAB — URINALYSIS, ROUTINE W REFLEX MICROSCOPIC
Bacteria, UA: NONE SEEN
Bilirubin Urine: NEGATIVE
Glucose, UA: >=500 mg/dL — AB
Hgb urine dipstick: NEGATIVE
Ketones, ur: 5 mg/dL — AB
Leukocytes,Ua: NEGATIVE
Nitrite: NEGATIVE
Protein, ur: NEGATIVE mg/dL
Specific Gravity, Urine: 1.03 (ref 1.005–1.030)
pH: 5 (ref 5.0–8.0)

## 2023-03-24 LAB — BASIC METABOLIC PANEL
Anion gap: 12 (ref 5–15)
BUN: 17 mg/dL (ref 6–20)
CO2: 22 mmol/L (ref 22–32)
Calcium: 9 mg/dL (ref 8.9–10.3)
Chloride: 92 mmol/L — ABNORMAL LOW (ref 98–111)
Creatinine, Ser: 1.62 mg/dL — ABNORMAL HIGH (ref 0.61–1.24)
GFR, Estimated: 55 mL/min — ABNORMAL LOW (ref >60)
Glucose, Bld: 735 mg/dL (ref 70–99)
Potassium: 4.2 mmol/L (ref 3.5–5.1)
Sodium: 126 mmol/L — ABNORMAL LOW (ref 135–145)

## 2023-03-24 LAB — CBG MONITORING, ED
Glucose-Capillary: >600 mg/dL (ref 70–99)
Glucose-Capillary: 545 mg/dL (ref 70–99)
Glucose-Capillary: 321 mg/dL — ABNORMAL HIGH (ref 70–99)

## 2023-03-24 LAB — BLOOD GAS, VENOUS
Acid-base deficit: 3.1 mmol/L — ABNORMAL HIGH (ref 0.0–2.0)
Bicarbonate: 21.2 mmol/L (ref 20.0–28.0)
O2 Saturation: 98.6 %
Patient temperature: 37
pCO2, Ven: 35 mmHg — ABNORMAL LOW (ref 44–60)
pH, Ven: 7.39 (ref 7.25–7.43)
pO2, Ven: 86 mmHg — ABNORMAL HIGH (ref 32–45)

## 2023-03-24 LAB — BETA-HYDROXYBUTYRIC ACID: Beta-Hydroxybutyric Acid: 1.48 mmol/L — ABNORMAL HIGH (ref 0.05–0.27)

## 2023-03-24 MED ORDER — LACTATED RINGERS IV BOLUS
20.0000 mL/kg | Freq: Once | INTRAVENOUS | Status: AC
  Administered 2023-03-24: 2100 mL via INTRAVENOUS

## 2023-03-24 MED ORDER — INSULIN GLARGINE 100 UNIT/ML SOLOSTAR PEN
40.0000 [IU] | PEN_INJECTOR | Freq: Every day | SUBCUTANEOUS | 3 refills | Status: DC
  Filled 2023-03-24: qty 12, 30d supply, fill #0

## 2023-03-24 MED ORDER — INSULIN GLARGINE-YFGN 100 UNIT/ML ~~LOC~~ SOLN
40.0000 [IU] | Freq: Once | SUBCUTANEOUS | Status: AC
  Administered 2023-03-24: 40 [IU] via SUBCUTANEOUS
  Filled 2023-03-24: qty 0.4

## 2023-03-24 MED ORDER — INSULIN ASPART 100 UNIT/ML IJ SOLN
10.0000 [IU] | Freq: Once | INTRAMUSCULAR | Status: AC
  Administered 2023-03-24: 10 [IU] via SUBCUTANEOUS
  Filled 2023-03-24: qty 0.1

## 2023-03-24 MED ORDER — LACTATED RINGERS IV BOLUS
1000.0000 mL | Freq: Once | INTRAVENOUS | Status: AC
  Administered 2023-03-24: 1000 mL via INTRAVENOUS

## 2023-03-24 MED ORDER — "PEN NEEDLES 5/16"" 31G X 8 MM MISC"
1.0000 | Freq: Four times a day (QID) | 1 refills | Status: DC
  Filled 2023-03-24: qty 100, 30d supply, fill #0

## 2023-03-24 MED ORDER — INSULIN LISPRO (1 UNIT DIAL) 100 UNIT/ML (KWIKPEN)
20.0000 [IU] | PEN_INJECTOR | Freq: Three times a day (TID) | SUBCUTANEOUS | 3 refills | Status: DC
  Filled 2023-03-24: qty 18, 30d supply, fill #0

## 2023-03-24 MED ORDER — ONDANSETRON HCL 4 MG/2ML IJ SOLN
4.0000 mg | Freq: Once | INTRAMUSCULAR | Status: AC
  Administered 2023-03-24: 4 mg via INTRAVENOUS
  Filled 2023-03-24: qty 2

## 2023-03-24 MED ORDER — LANCETS MISC
1.0000 | Freq: Four times a day (QID) | 1 refills | Status: DC
  Filled 2023-03-24: qty 100, 25d supply, fill #0

## 2023-03-24 NOTE — Social Work (Signed)
CSW provided resources. Patient knows to follow up with clinic as well Social Services to apply for [employer]  was approved CSW informed the patients nurse.

## 2023-03-24 NOTE — Care Management (Signed)
[facility] Medication Assistance Card *Pharmacies please call [phone] for claim processing assistance Name: Gor Thrapp                                                                                                                                                                                       Relationship Code:  1 ID (MRN): [id number]                                                                                                                                                                                   Person Code:  01 Bin: [id number] RX Group: [id number] Discharge Date: 88/26/2024                                 RX PCN:  [id number] Expiration Date:04/04/2023                                           (must be filled within 7 days of discharge)     You have been approved to have the prescriptions written by your discharging physician filled through our [facility] (Medication Assistance Through [facility]) program. This program allows for a one-time (no refills) 34-day supply of selected medications for a low copay amount.  The copay is $0 per prescription.   Only certain pharmacies are participating in this program with [facility]. You will need to select one of the pharmacies from the attached list and take your prescriptions, this letter, and your photo ID to one of the [facility] pharmacies, [facility], [facility] [address] or [facility] [address].   We are excited  that you are able to use the [facility] program to get your medications. These prescriptions must be filled within 7 days of hospital discharge or they will no longer be valid for the [facility] program. Should you have any problems with your prescriptions please contact your case management team member at [phone] for [place]/ [facility].  Thank you,   [facility] Health Care Management

## 2023-03-24 NOTE — ED Provider Notes (Signed)
[place] EMERGENCY DEPARTMENT AT [facility] Provider Note   CSN: [id number] Arrival date & time: 03/24/23  1913     History  Chief Complaint  Patient presents with   Hyperglycemia    [name] [name] is a 37 y.o. male.  Patient is a 37 year old male with a past medical history of diabetes senting to the emergency department with hyperglycemia.  The patient states that his blood sugar has been running high the last several days.  He states that he took his insulin yesterday but "it did not work".  He states that he is almost out of his insulin so has not been taking as prescribed.  He reports some nausea but denies any vomiting.  He reports increased urinary frequency but denies any dysuria however does report some suprapubic pain when he urinates.  Denies any diarrhea.  He denies any fever or cough.  He states that he did have some chest pain yesterday.  The history is provided by the patient.  Hyperglycemia      Home Medications Prior to Admission medications   Medication Sig Start Date End Date Taking? Authorizing Provider  blood glucose meter kit and supplies KIT Use up to four times daily as directed to test blood sugar 01/02/23   Rhetta Mura, MD  insulin glargine (LANTUS) 100 UNIT/ML Solostar Pen Inject 40 Units into the skin at bedtime. 03/24/23 07/22/23  Elayne Snare K, DO  insulin lispro (HUMALOG) 100 UNIT/ML KwikPen Inject 20 Units into the skin 3 (three) times daily before meals. 03/24/23 07/22/23  Elayne Snare K, DO  Insulin Pen Needle (PEN NEEDLES 31GX5/16") 31G X 8 MM MISC Use to inject insulin in the morning, at noon, in the evening, and at bedtime. 03/24/23   Elayne Snare K, DO  Lancets MISC Use 1 each in the morning, at noon, in the evening, and at bedtime. 03/24/23   Elayne Snare K, DO  lisinopril (ZESTRIL) 10 MG tablet Take 1 tablet (10 mg total) by mouth daily. 11/05/22 01/02/23  Gowens, Mariah L, PA-C  nicotine (NICODERM CQ -  DOSED IN MG/24 HOURS) 14 mg/24hr patch Place 1 patch (14 mg total) onto the skin daily. 01/02/23   Rhetta Mura, MD      Allergies    Patient has no known allergies.    Review of Systems   Review of Systems  Physical Exam Updated Vital Signs BP (!) 145/98   Pulse 86   Temp 98.6 F (37 C)   Resp 16   Ht 5\' 11"  (1.803 m)   Wt 105 kg   SpO2 99%   BMI 32.29 kg/m  Physical Exam Vitals and nursing note reviewed.  Constitutional:      General: He is not in acute distress.    Appearance: Normal appearance.  HENT:     Head: Normocephalic and atraumatic.     Nose: Nose normal.     Mouth/Throat:     Mouth: Mucous membranes are dry.     Pharynx: Oropharynx is clear.  Eyes:     Extraocular Movements: Extraocular movements intact.     Conjunctiva/sclera: Conjunctivae normal.  Cardiovascular:     Rate and Rhythm: Regular rhythm. Tachycardia present.     Pulses: Normal pulses.     Heart sounds: Normal heart sounds.  Pulmonary:     Effort: Pulmonary effort is normal.     Breath sounds: Normal breath sounds.  Abdominal:     General: Abdomen is flat.     Palpations:  Abdomen is soft.     Tenderness: There is no abdominal tenderness.  Musculoskeletal:        General: Normal range of motion.     Cervical back: Normal range of motion.  Skin:    General: Skin is warm and dry.  Neurological:     General: No focal deficit present.     Mental Status: He is alert and oriented to person, place, and time.  Psychiatric:        Mood and Affect: Mood normal.        Behavior: Behavior normal.     ED Results / Procedures / Treatments   Labs (all labs ordered are listed, but only abnormal results are displayed) Labs Reviewed  BASIC METABOLIC PANEL - Abnormal; Notable for the following components:      Result Value   Sodium 126 (*)    Chloride 92 (*)    Glucose, Bld 735 (*)    Creatinine, Ser 1.62 (*)    GFR, Estimated 55 (*)    All other components within normal limits   BETA-HYDROXYBUTYRIC ACID - Abnormal; Notable for the following components:   Beta-Hydroxybutyric Acid 1.48 (*)    All other components within normal limits  URINALYSIS, ROUTINE W REFLEX MICROSCOPIC - Abnormal; Notable for the following components:   Color, Urine STRAW (*)    Glucose, UA >=500 (*)    Ketones, ur 5 (*)    All other components within normal limits  BLOOD GAS, VENOUS - Abnormal; Notable for the following components:   pCO2, Ven 35 (*)    pO2, Ven 86 (*)    Acid-base deficit 3.1 (*)    All other components within normal limits  CBG MONITORING, ED - Abnormal; Notable for the following components:   Glucose-Capillary >600 (*)    All other components within normal limits  CBG MONITORING, ED - Abnormal; Notable for the following components:   Glucose-Capillary 545 (*)    All other components within normal limits  CBG MONITORING, ED - Abnormal; Notable for the following components:   Glucose-Capillary 321 (*)    All other components within normal limits  CBC WITH DIFFERENTIAL/PLATELET    EKG None  Radiology DG Chest Portable 1 View  Result Date: 03/24/2023 CLINICAL DATA:  Chest pain EXAM: PORTABLE CHEST 1 VIEW COMPARISON:  Chest radiograph 01/16/2023 FINDINGS: The cardiomediastinal silhouette is stable and within normal limits There is no focal consolidation or pulmonary edema. There is no pleural effusion or pneumothorax There is no acute osseous abnormality. IMPRESSION: No radiographic evidence of acute cardiopulmonary process. Electronically Signed   By: Lesia Hausen M.D.   On: 03/24/2023 20:06    Procedures Procedures    Medications Ordered in ED Medications  insulin glargine-yfgn (SEMGLEE) injection 40 Units (has no administration in time range)  lactated ringers bolus 2,100 mL (0 mLs Intravenous Stopped 03/24/23 2144)  ondansetron (ZOFRAN) injection 4 mg (4 mg Intravenous Given 03/24/23 1950)  insulin aspart (novoLOG) injection 10 Units (10 Units Subcutaneous  Given 03/24/23 2112)  lactated ringers bolus 1,000 mL (0 mLs Intravenous Stopped 03/24/23 2328)    ED Course/ Medical Decision Making/ A&P Clinical Course as of 03/24/23 2328  Mon Mar 24, 2023  2100 Hyperglycemia without DKA. Will give insulin and repeat accucheck. [VK]  2127 Patient is asymptomatic at this time. Will reassess accucheck. Will need medication assistance as he is currently uninsured but would prefer discharge if possible. [VK]  2326 Glucose has improved. He is stable for discharge  home. Will be given long acting insulin prior to discharge as he is unable to fill his prescriptions until the AM. SW provided instructions on assistance of medication refill at the pharmacy here in the AM and given outpatient follow up in assistance to obtain insurance. [VK]    Clinical Course User Index [VK] Rexford Maus, DO                                 Medical Decision Making This patient presents to the ED with chief complaint(s) of hyperglycemia with pertinent past medical history of DM which further complicates the presenting complaint. The complaint involves an extensive differential diagnosis and also carries with it a high risk of complications and morbidity.    The differential diagnosis includes hyperglycemic crisis, DKA, dehydration, electrolyte abnormality, ACS, arrhythmia, pneumonia, pneumothorax, UTI, other infection, medication noncompliance  Additional history obtained: Additional history obtained from N/A Records reviewed many ED records for hyperglycemic crisis  ED Course and Reassessment: Patient's arrival to the emergency department he is tachycardic but otherwise hemodynamically stable, dry appearing on exam.  Initial Accu-Chek was read as greater than 600.  The patient will have labs to evaluate for DKA or other hyperglycemic crisis and will be started on IV fluids.  Of EKG and urine to evaluate for cause of his hyperglycemic crisis.  Independent labs  interpretation:  The following labs were independently interpreted: hyperglycemia without DKA  Independent visualization of imaging: - I independently visualized the following imaging with scope of interpretation limited to determining acute life threatening conditions related to emergency care: CXR, which revealed no acute disease  Consultation: - Consulted or discussed management/test interpretation w/ external professional: TOC  Consideration for admission or further workup: Patient has no emergent conditions requiring admission or further work-up at this time and is stable for discharge home with primary care follow-up  Social Determinants of health: uninsured, homelessness    Amount and/or Complexity of Data Reviewed Labs: ordered. Radiology: ordered.  Risk OTC drugs. Prescription drug management.          Final Clinical Impression(s) / ED Diagnoses Final diagnoses:  Hyperglycemia    Rx / DC Orders ED Discharge Orders          Ordered    insulin glargine (LANTUS) 100 UNIT/ML Solostar Pen  Daily at bedtime        03/24/23 2202    insulin lispro (HUMALOG) 100 UNIT/ML KwikPen  3 times daily before meals        03/24/23 2202    Insulin Pen Needle (PEN NEEDLES 31GX5/16") 31G X 8 MM MISC  4 times daily        03/24/23 2202    Lancets MISC  4 times daily        03/24/23 2202              Rexford Maus, DO 03/24/23 2328

## 2023-03-24 NOTE — Care Management (Signed)
ED RNCM discussed sending prescriptions to [facility] with EDP. [facility] ED CSW spoke with patient regarding prescriptions being sent to [facility] with [facility] voucher and patient agrees to pick up meds in the am. Patient was provided bus pass to return to pharmacy.

## 2023-03-24 NOTE — Discharge Instructions (Addendum)
Please follow up for medicaid  Please follow up with clinic    Your medications were sent to the [facility] and you should have assistance in filling your medications tomorrow morning. Please take them as prescribed. Please follow up for blood sugar recheck with your primary doctor. Return to the ER if you have repetitive vomiting, severe abdominal pain, blood sugar is too high to read on your monitor or any other new or concerning symptoms.

## 2023-03-24 NOTE — ED Triage Notes (Signed)
Patient BIB EMS c/o hyperglycemia from shelter. Patient report unable to take home medication for DM. Patient report he felt so weakness and dehydrated. Patient denies N/V.  BP 160  HR 118 RR 20 O2sat 97%on RA

## 2023-03-25 ENCOUNTER — Other Ambulatory Visit (HOSPITAL_COMMUNITY): Payer: Self-pay

## 2023-03-27 ENCOUNTER — Encounter (HOSPITAL_COMMUNITY): Payer: Self-pay | Admitting: Emergency Medicine

## 2023-03-27 ENCOUNTER — Other Ambulatory Visit: Payer: Self-pay

## 2023-03-27 ENCOUNTER — Emergency Department (HOSPITAL_COMMUNITY): Payer: Self-pay

## 2023-03-27 ENCOUNTER — Emergency Department (HOSPITAL_COMMUNITY)
Admission: EM | Admit: 2023-03-27 | Discharge: 2023-03-27 | Disposition: A | Discharge status: Home / Self Care | Payer: Self-pay | Attending: Emergency Medicine | Admitting: Emergency Medicine

## 2023-03-27 DIAGNOSIS — E162 Hypoglycemia, unspecified: Secondary | ICD-10-CM

## 2023-03-27 DIAGNOSIS — E11649 Type 2 diabetes mellitus with hypoglycemia without coma: Secondary | ICD-10-CM | POA: Insufficient documentation

## 2023-03-27 HISTORY — DX: Essential (primary) hypertension: I10

## 2023-03-27 HISTORY — DX: Type 2 diabetes mellitus without complications: E11.9

## 2023-03-27 LAB — COMPREHENSIVE METABOLIC PANEL
ALT: 18 U/L (ref 0–44)
AST: 32 U/L (ref 15–41)
Albumin: 3.2 g/dL — ABNORMAL LOW (ref 3.5–5.0)
Alkaline Phosphatase: 67 U/L (ref 38–126)
Anion gap: 11 (ref 5–15)
BUN: 15 mg/dL (ref 6–20)
CO2: 23 mmol/L (ref 22–32)
Calcium: 8.7 mg/dL — ABNORMAL LOW (ref 8.9–10.3)
Chloride: 101 mmol/L (ref 98–111)
Creatinine, Ser: 1.04 mg/dL (ref 0.61–1.24)
GFR, Estimated: >60 mL/min (ref >60)
Glucose, Bld: 249 mg/dL — ABNORMAL HIGH (ref 70–99)
Potassium: 3.8 mmol/L (ref 3.5–5.1)
Sodium: 135 mmol/L (ref 135–145)
Total Bilirubin: 0.5 mg/dL (ref 0.3–1.2)
Total Protein: 6.3 g/dL — ABNORMAL LOW (ref 6.5–8.1)

## 2023-03-27 LAB — URINALYSIS, ROUTINE W REFLEX MICROSCOPIC
Bacteria, UA: NONE SEEN
Bilirubin Urine: NEGATIVE
Glucose, UA: >=500 mg/dL — AB
Hgb urine dipstick: NEGATIVE
Ketones, ur: 5 mg/dL — AB
Leukocytes,Ua: NEGATIVE
Nitrite: NEGATIVE
Protein, ur: NEGATIVE mg/dL
Specific Gravity, Urine: 1.03 (ref 1.005–1.030)
pH: 5 (ref 5.0–8.0)

## 2023-03-27 LAB — CBC WITH DIFFERENTIAL/PLATELET
Abs Immature Granulocytes: 0 10*3/uL (ref 0.00–0.07)
Basophils Absolute: 0 10*3/uL (ref 0.0–0.1)
Basophils Relative: 0 %
Eosinophils Absolute: 0 10*3/uL (ref 0.0–0.5)
Eosinophils Relative: 0 %
HCT: 42.4 % (ref 39.0–52.0)
Hemoglobin: 14.2 g/dL (ref 13.0–17.0)
Immature Granulocytes: 0 %
Lymphocytes Relative: 24 %
Lymphs Abs: 1.3 10*3/uL (ref 0.7–4.0)
MCH: 28.2 pg (ref 26.0–34.0)
MCHC: 33.5 g/dL (ref 30.0–36.0)
MCV: 84.1 fL (ref 80.0–100.0)
Monocytes Absolute: 0.4 10*3/uL (ref 0.1–1.0)
Monocytes Relative: 8 %
Neutro Abs: 3.6 10*3/uL (ref 1.7–7.7)
Neutrophils Relative %: 68 %
Platelets: 228 10*3/uL (ref 150–400)
RBC: 5.04 MIL/uL (ref 4.22–5.81)
RDW: 12.7 % (ref 11.5–15.5)
WBC: 5.3 10*3/uL (ref 4.0–10.5)
nRBC: 0 % (ref 0.0–0.2)

## 2023-03-27 LAB — CBG MONITORING, ED
Glucose-Capillary: 171 mg/dL — ABNORMAL HIGH (ref 70–99)
Glucose-Capillary: 264 mg/dL — ABNORMAL HIGH (ref 70–99)
Glucose-Capillary: 164 mg/dL — ABNORMAL HIGH (ref 70–99)

## 2023-03-27 NOTE — ED Notes (Signed)
Patient now alert and following commands. EDP at bedside.

## 2023-03-27 NOTE — ED Notes (Signed)
Patient transported to CT 

## 2023-03-27 NOTE — Discharge Instructions (Addendum)
You were seen in the emergency department for your low blood sugar.  You had no signs of injury or complication from her sugar being low and its likely related to taking too much insulin.  You should increase your insulin to 10 units with meals and make sure that you check your blood sugar prior to giving yourself insulin every time.  You can follow-up with your primary doctor for reassessment and for further insulin management.  You should return to the emergency department if you having recurrent episodes of low blood sugar, your sugar is too high to read on your monitor, you have fevers, severe abdominal pain or repetitive vomiting or any other new or concerning symptoms.

## 2023-03-27 NOTE — ED Triage Notes (Signed)
Per [facility] pt coming from [facility] parking lot- according to staff pt has been out there for "a while." When ems arrived pt was halfway in car and halfway on ground. Vomit in car. Found novolog in car. CBG 69. Given 2 mg narcan with no response. No response from IV stick.

## 2023-03-27 NOTE — ED Provider Notes (Signed)
[place] EMERGENCY DEPARTMENT AT [facility] Provider Note   CSN: [id number] Arrival date & time: 03/27/23  1124     History  Chief Complaint  Patient presents with   Unresponsive    [name] [name] is a 37 y.o. male.  Patient is a 37 year old male with a past medical history of diabetes presenting to the emergency department after being found unresponsive.  Per EMS he was found in a [facility] parking lot have fallen out of his car unresponsive.  They state that Accu-Chek on their arrival was 67.  They gave 2 mg of Narcan as well as 25 mL of D10 without significant improvement.  They state that he did have vomitus on him.  They state that they placed him on nonrebreather and route though is satting 95% on room air.  They state that he did have insulin in his car but no other drug paraphernalia.  On patient's arrival here he is starting to wake up and did state that he has a history of diabetes.  He states that he got hot which he thinks may have caused him to pass out.  He states that he has been taking his insulin but is unsure if he is taking too much insulin.  He states that he has been checking his blood sugars at home and they have been running low for the past day.  He denies any headache, does report some neck pain denies any fever, current nausea, vomiting or diarrhea.  He denies any abdominal pain.  The history is provided by the patient and the EMS personnel.       Home Medications Prior to Admission medications   Not on File      Allergies    Patient has no known allergies.    Review of Systems   Review of Systems  Physical Exam Updated Vital Signs BP (!) 153/118   Pulse 81   Temp 98.3 F (36.8 C) (Oral)   Resp 14   Ht 5\' 11"  (1.803 m)   Wt 104.3 kg   SpO2 98%   BMI 32.08 kg/m  Physical Exam Vitals and nursing note reviewed.  Constitutional:      General: He is not in acute distress.    Appearance: Normal appearance.  HENT:     Head:  Normocephalic and atraumatic.     Nose: Nose normal.     Mouth/Throat:     Mouth: Mucous membranes are moist.     Pharynx: Oropharynx is clear.  Eyes:     Extraocular Movements: Extraocular movements intact.     Conjunctiva/sclera: Conjunctivae normal.  Neck:     Comments: Midline neck tenderness, c-collar in place Cardiovascular:     Rate and Rhythm: Normal rate and regular rhythm.     Heart sounds: Normal heart sounds.  Pulmonary:     Effort: Pulmonary effort is normal.     Breath sounds: Normal breath sounds.  Abdominal:     General: Abdomen is flat.     Palpations: Abdomen is soft.     Tenderness: There is no abdominal tenderness.  Musculoskeletal:        General: Normal range of motion.  Skin:    General: Skin is warm and dry.  Neurological:     General: No focal deficit present.     Mental Status: He is alert and oriented to person, place, and time.  Psychiatric:        Mood and Affect: Mood normal.  Behavior: Behavior normal.     ED Results / Procedures / Treatments   Labs (all labs ordered are listed, but only abnormal results are displayed) Labs Reviewed  COMPREHENSIVE METABOLIC PANEL - Abnormal; Notable for the following components:      Result Value   Glucose, Bld 249 (*)    Calcium 8.7 (*)    Total Protein 6.3 (*)    Albumin 3.2 (*)    All other components within normal limits  URINALYSIS, ROUTINE W REFLEX MICROSCOPIC - Abnormal; Notable for the following components:   Glucose, UA >=500 (*)    Ketones, ur 5 (*)    All other components within normal limits  CBG MONITORING, ED - Abnormal; Notable for the following components:   Glucose-Capillary 171 (*)    All other components within normal limits  CBG MONITORING, ED - Abnormal; Notable for the following components:   Glucose-Capillary 264 (*)    All other components within normal limits  CBG MONITORING, ED - Abnormal; Notable for the following components:   Glucose-Capillary 164 (*)    All  other components within normal limits  CBC WITH DIFFERENTIAL/PLATELET    EKG EKG Interpretation Date/Time:  Thursday March 27 2023 12:28:45 EDT Ventricular Rate:  93 PR Interval:  141 QRS Duration:  118 QT Interval:  352 QTC Calculation: 438 R Axis:   -31  Text Interpretation: Sinus rhythm Nonspecific intraventricular conduction delay No previous ECGs available Confirmed by Elayne Snare ([id number]) on 03/27/2023 1:39:47 PM  Radiology CT Head Wo Contrast  Result Date: 03/27/2023 CLINICAL DATA:  Altered mental status.  Possible traumatic injury. EXAM: CT HEAD WITHOUT CONTRAST CT CERVICAL SPINE WITHOUT CONTRAST TECHNIQUE: Multidetector CT imaging of the head and cervical spine was performed following the standard protocol without intravenous contrast. Multiplanar CT image reconstructions of the cervical spine were also generated. RADIATION DOSE REDUCTION: This exam was performed according to the departmental dose-optimization program which includes automated exposure control, adjustment of the mA and/or kV according to patient size and/or use of iterative reconstruction technique. COMPARISON:  None Available. FINDINGS: CT HEAD FINDINGS Brain: No evidence of acute infarction, hemorrhage, hydrocephalus, extra-axial collection or mass lesion/mass effect. Vascular: No hyperdense vessel or unexpected calcification. Skull: Normal. Negative for fracture or focal lesion. Sinuses/Orbits: No acute finding. Other: Small right frontal scalp hematoma. CT CERVICAL SPINE FINDINGS Alignment: Straightening and slight reversal of the normal cervical lordosis. No traumatic malalignment. Skull base and vertebrae: No acute fracture. No primary bone lesion or focal pathologic process. Soft tissues and spinal canal: No prevertebral fluid or swelling. No visible canal hematoma. Disc levels:  Disc heights are relatively preserved. Upper chest: Negative. Other: None. IMPRESSION: 1. No acute intracranial abnormality. Small  right frontal scalp hematoma. 2. No acute cervical spine fracture or traumatic listhesis. Electronically Signed   By: Obie Dredge M.D.   On: 03/27/2023 14:51   CT Cervical Spine Wo Contrast  Result Date: 03/27/2023 CLINICAL DATA:  Altered mental status.  Possible traumatic injury. EXAM: CT HEAD WITHOUT CONTRAST CT CERVICAL SPINE WITHOUT CONTRAST TECHNIQUE: Multidetector CT imaging of the head and cervical spine was performed following the standard protocol without intravenous contrast. Multiplanar CT image reconstructions of the cervical spine were also generated. RADIATION DOSE REDUCTION: This exam was performed according to the departmental dose-optimization program which includes automated exposure control, adjustment of the mA and/or kV according to patient size and/or use of iterative reconstruction technique. COMPARISON:  None Available. FINDINGS: CT HEAD FINDINGS Brain: No evidence of acute infarction,  hemorrhage, hydrocephalus, extra-axial collection or mass lesion/mass effect. Vascular: No hyperdense vessel or unexpected calcification. Skull: Normal. Negative for fracture or focal lesion. Sinuses/Orbits: No acute finding. Other: Small right frontal scalp hematoma. CT CERVICAL SPINE FINDINGS Alignment: Straightening and slight reversal of the normal cervical lordosis. No traumatic malalignment. Skull base and vertebrae: No acute fracture. No primary bone lesion or focal pathologic process. Soft tissues and spinal canal: No prevertebral fluid or swelling. No visible canal hematoma. Disc levels:  Disc heights are relatively preserved. Upper chest: Negative. Other: None. IMPRESSION: 1. No acute intracranial abnormality. Small right frontal scalp hematoma. 2. No acute cervical spine fracture or traumatic listhesis. Electronically Signed   By: Obie Dredge M.D.   On: 03/27/2023 14:51   DG Chest Port 1 View  Result Date: 03/27/2023 CLINICAL DATA:  Altered mental status.  Fall EXAM: PORTABLE CHEST 1  VIEW COMPARISON:  Chest x-ray 03/24/2023 and older FINDINGS: No consolidation, pneumothorax or effusion. No edema. Normal cardiopericardial silhouette. Overlapping cardiac leads. The right inferior costophrenic angle is clipped off the edge of the film. IMPRESSION: No acute cardiopulmonary disease Electronically Signed   By: Karen Kays M.D.   On: 03/27/2023 13:27    Procedures Procedures    Medications Ordered in ED Medications - No data to display  ED Course/ Medical Decision Making/ A&P Clinical Course as of 03/27/23 1610  Thu Mar 27, 2023  1450 Patient is awake and alert at his baseline, has tolerated PO. CT reads pending at this time. No further hypoglycemic episodes. [VK]    Clinical Course User Index [VK] Rexford Maus, DO                                 Medical Decision Making This patient presents to the ED with chief complaint(s) of altered mental status with pertinent past medical history of diabetes which further complicates the presenting complaint. The complaint involves an extensive differential diagnosis and also carries with it a high risk of complications and morbidity.    The differential diagnosis includes hypo or hyperglycemia, ICH, mass effect, arrhythmia, anemia, dehydration, electrolyte abnormality, intoxication, overdose  Additional history obtained: Additional history obtained from EMS  Records reviewed N/A  ED Course and Reassessment: On patient's arrival his blood sugar was in the 170s and mental status was improving compared to when he was first picked up by EMS.  Due to patient's fall and having neck pain he will have a head CT and C-spine performed.  He will have labs and EKG to evaluate for cause of his hypoglycemic episode.  The patient will be given food and drink to eat and will have serial glucose monitoring.  Independent labs interpretation:  The following labs were independently interpreted: Mild hyperglycemia otherwise within normal  range  Independent visualization of imaging: - I independently visualized the following imaging with scope of interpretation limited to determining acute life threatening conditions related to emergency care: CT head/C-spine, chest x-ray, which revealed no acute traumatic injury  Consultation: - Consulted or discussed management/test interpretation w/ external professional: N/A  Consideration for admission or further workup: Patient has no emergent conditions requiring admission or further work-up at this time and is stable for discharge home with primary care follow-up  Social Determinants of health: N/A    Amount and/or Complexity of Data Reviewed Labs: ordered. Radiology: ordered.          Final Clinical Impression(s) / ED Diagnoses  Final diagnoses:  Hypoglycemia    Rx / DC Orders ED Discharge Orders     None         Rexford Maus, DO 03/27/23 1610

## 2023-03-28 ENCOUNTER — Encounter (HOSPITAL_COMMUNITY): Payer: Self-pay

## 2023-04-01 ENCOUNTER — Other Ambulatory Visit (HOSPITAL_COMMUNITY): Payer: Self-pay

## 2023-04-01 MED ORDER — ONETOUCH ULTRA TEST VI STRP
ORAL_STRIP | 1 refills | Status: DC
  Filled 2023-04-01: qty 50, 13d supply, fill #0

## 2023-04-12 ENCOUNTER — Other Ambulatory Visit: Payer: Self-pay

## 2023-04-12 ENCOUNTER — Emergency Department (HOSPITAL_COMMUNITY): Payer: Self-pay

## 2023-04-12 ENCOUNTER — Encounter (HOSPITAL_COMMUNITY): Payer: Self-pay

## 2023-04-12 ENCOUNTER — Other Ambulatory Visit (HOSPITAL_COMMUNITY): Payer: Self-pay

## 2023-04-12 ENCOUNTER — Emergency Department (HOSPITAL_COMMUNITY)
Admission: EM | Admit: 2023-04-12 | Discharge: 2023-04-12 | Disposition: A | Discharge status: Home / Self Care | Payer: Self-pay

## 2023-04-12 DIAGNOSIS — R1031 Right lower quadrant pain: Secondary | ICD-10-CM

## 2023-04-12 DIAGNOSIS — Z79899 Other long term (current) drug therapy: Secondary | ICD-10-CM | POA: Insufficient documentation

## 2023-04-12 DIAGNOSIS — I10 Essential (primary) hypertension: Secondary | ICD-10-CM

## 2023-04-12 DIAGNOSIS — Z794 Long term (current) use of insulin: Secondary | ICD-10-CM | POA: Insufficient documentation

## 2023-04-12 DIAGNOSIS — E1165 Type 2 diabetes mellitus with hyperglycemia: Secondary | ICD-10-CM

## 2023-04-12 LAB — URINALYSIS, ROUTINE W REFLEX MICROSCOPIC
Bacteria, UA: NONE SEEN
Bilirubin Urine: NEGATIVE
Glucose, UA: >=500 mg/dL — AB
Hgb urine dipstick: NEGATIVE
Ketones, ur: NEGATIVE mg/dL
Leukocytes,Ua: NEGATIVE
Nitrite: NEGATIVE
Protein, ur: NEGATIVE mg/dL
Specific Gravity, Urine: 1.029 (ref 1.005–1.030)
pH: 5 (ref 5.0–8.0)

## 2023-04-12 LAB — CBC WITH DIFFERENTIAL/PLATELET
Abs Immature Granulocytes: 0.01 10*3/uL (ref 0.00–0.07)
Basophils Absolute: 0 10*3/uL (ref 0.0–0.1)
Basophils Relative: 1 %
Eosinophils Absolute: 0.1 10*3/uL (ref 0.0–0.5)
Eosinophils Relative: 2 %
HCT: 42.7 % (ref 39.0–52.0)
Hemoglobin: 13.9 g/dL (ref 13.0–17.0)
Immature Granulocytes: 0 %
Lymphocytes Relative: 36 %
Lymphs Abs: 1.6 10*3/uL (ref 0.7–4.0)
MCH: 28 pg (ref 26.0–34.0)
MCHC: 32.6 g/dL (ref 30.0–36.0)
MCV: 86.1 fL (ref 80.0–100.0)
Monocytes Absolute: 0.5 10*3/uL (ref 0.1–1.0)
Monocytes Relative: 12 %
Neutro Abs: 2.2 10*3/uL (ref 1.7–7.7)
Neutrophils Relative %: 49 %
Platelets: 188 10*3/uL (ref 150–400)
RBC: 4.96 MIL/uL (ref 4.22–5.81)
RDW: 12.7 % (ref 11.5–15.5)
WBC: 4.3 10*3/uL (ref 4.0–10.5)
nRBC: 0 % (ref 0.0–0.2)

## 2023-04-12 LAB — COMPREHENSIVE METABOLIC PANEL
ALT: 23 U/L (ref 0–44)
AST: 16 U/L (ref 15–41)
Albumin: 3.4 g/dL — ABNORMAL LOW (ref 3.5–5.0)
Alkaline Phosphatase: 87 U/L (ref 38–126)
Anion gap: 8 (ref 5–15)
BUN: 13 mg/dL (ref 6–20)
CO2: 26 mmol/L (ref 22–32)
Calcium: 8.8 mg/dL — ABNORMAL LOW (ref 8.9–10.3)
Chloride: 98 mmol/L (ref 98–111)
Creatinine, Ser: 1.1 mg/dL (ref 0.61–1.24)
GFR, Estimated: >60 mL/min (ref >60)
Glucose, Bld: 359 mg/dL — ABNORMAL HIGH (ref 70–99)
Potassium: 3.7 mmol/L (ref 3.5–5.1)
Sodium: 132 mmol/L — ABNORMAL LOW (ref 135–145)
Total Bilirubin: 0.4 mg/dL (ref 0.3–1.2)
Total Protein: 7.1 g/dL (ref 6.5–8.1)

## 2023-04-12 LAB — CBG MONITORING, ED
Glucose-Capillary: 388 mg/dL — ABNORMAL HIGH (ref 70–99)
Glucose-Capillary: 326 mg/dL — ABNORMAL HIGH (ref 70–99)

## 2023-04-12 LAB — LIPASE, BLOOD: Lipase: 43 U/L (ref 11–51)

## 2023-04-12 LAB — BETA-HYDROXYBUTYRIC ACID: Beta-Hydroxybutyric Acid: 0.1 mmol/L (ref 0.05–0.27)

## 2023-04-12 MED ORDER — MORPHINE SULFATE (PF) 4 MG/ML IV SOLN
4.0000 mg | Freq: Once | INTRAVENOUS | Status: AC
  Administered 2023-04-12: 4 mg via INTRAVENOUS
  Filled 2023-04-12: qty 1

## 2023-04-12 MED ORDER — DICYCLOMINE HCL 20 MG PO TABS
20.0000 mg | ORAL_TABLET | Freq: Three times a day (TID) | ORAL | 0 refills | Status: DC | PRN
Start: 2023-04-12 — End: 2023-04-21
  Filled 2023-04-12: qty 15, 5d supply, fill #0

## 2023-04-12 MED ORDER — LACTATED RINGERS IV BOLUS
1000.0000 mL | Freq: Once | INTRAVENOUS | Status: AC
  Administered 2023-04-12: 1000 mL via INTRAVENOUS

## 2023-04-12 MED ORDER — SODIUM CHLORIDE (PF) 0.9 % IJ SOLN
INTRAMUSCULAR | Status: AC
  Filled 2023-04-12: qty 50

## 2023-04-12 MED ORDER — ONDANSETRON HCL 4 MG/2ML IJ SOLN
4.0000 mg | Freq: Once | INTRAMUSCULAR | Status: AC
  Administered 2023-04-12: 4 mg via INTRAVENOUS
  Filled 2023-04-12: qty 2

## 2023-04-12 MED ORDER — IOHEXOL 300 MG/ML  SOLN
100.0000 mL | Freq: Once | INTRAMUSCULAR | Status: AC | PRN
  Administered 2023-04-12: 100 mL via INTRAVENOUS

## 2023-04-12 MED ORDER — LACTATED RINGERS IV BOLUS: 2000.0000 mL | Freq: Once | INTRAVENOUS | Status: DC

## 2023-04-12 NOTE — Progress Notes (Signed)
CSW received message pt is requesting transportation assistance back to his car. CSW approved taxi voucher for d/c. TOC sign off.   Valentina Shaggy.Calub Tarnow, MSW, LCSWA [facility]  Transitions of Care Clinical Social Worker I Direct Dial: [phone]  Fax: [phone] [email or web address]

## 2023-04-12 NOTE — ED Notes (Signed)
Pt asked for a cab voucher. I am checking to see whether we have any

## 2023-04-12 NOTE — ED Notes (Signed)
Sent message to social worker inquiring about cab voucher.

## 2023-04-12 NOTE — ED Provider Notes (Signed)
[facility] Provider Note   CSN: [id number] Arrival date & time: 04/12/23  8657     History  Chief Complaint  Patient presents with   Hyperglycemia   Hypertension    [name] [name] is a 37 y.o. male with past medical history hypertension, hyperlipidemia, diabetes who presents to the ED complaining of right lower quadrant abdominal pain that has been intermittent for the last 2 days.  States that it has happened multiple times before though he has never been evaluated for it.  Denies previous abdominal surgeries.  No associated fever, nausea, vomiting, testicular pain or swelling, back pain, constipation or diarrhea.  Last bowel movement last night was normal.  No hematemesis, hematochezia, or melena.  Notes concern for pain as family members "had cancer in this area and I am scared this might happen to me."  Denies previous colonoscopy.  Uncertain etiology of family history of malignancy.  Also notes concern due to hyperglycemia.  States that he has mostly been adherent with insulin regimen at home though accidentally fell asleep last night before taking his dose.  Noted glucose to be in the 300s this morning with concern due to history of multiple episodes of DKA and HHS. States typically glucose well controlled in 120-180 range. Denies chest pain, dyspnea, nausea, vomiting, or other complaints apart from abdominal pain mentioned above.        Home Medications Prior to Admission medications   Medication Sig Start Date End Date Taking? Authorizing Provider  dicyclomine (BENTYL) 20 MG tablet Take 1 tablet (20 mg total) by mouth 3 (three) times daily as needed for up to 5 days for spasms. 04/12/23 04/17/23 Yes Devaeh Amadi L, PA-C  blood glucose meter kit and supplies KIT Use up to four times daily as directed to test blood sugar 01/02/23   Rhetta Mura, MD  glucose blood (ONETOUCH ULTRA TEST) test strip Use 1 strip to check blood sugar  in the morning, at noon, in the evening, and at bedtime. 03/24/23   Elayne Snare K, DO  insulin glargine (LANTUS) 100 UNIT/ML Solostar Pen Inject 40 Units into the skin at bedtime. 03/24/23 07/22/23  Elayne Snare K, DO  insulin lispro (HUMALOG) 100 UNIT/ML KwikPen Inject 20 Units into the skin 3 (three) times daily before meals. 03/24/23 07/22/23  Elayne Snare K, DO  Insulin Pen Needle (PEN NEEDLES 31GX5/16") 31G X 8 MM MISC Use to inject insulin in the morning, at noon, in the evening, and at bedtime. 03/24/23   Elayne Snare K, DO  Lancets MISC Use 1 each in the morning, at noon, in the evening, and at bedtime. 03/24/23   Elayne Snare K, DO  lisinopril (ZESTRIL) 10 MG tablet Take 1 tablet (10 mg total) by mouth daily. 11/05/22 01/02/23  Kentrell Hallahan L, PA-C  nicotine (NICODERM CQ - DOSED IN MG/24 HOURS) 14 mg/24hr patch Place 1 patch (14 mg total) onto the skin daily. 01/02/23   Rhetta Mura, MD      Allergies    Patient has no known allergies.    Review of Systems   Review of Systems  All other systems reviewed and are negative.   Physical Exam Updated Vital Signs BP (!) 164/111 (BP Location: Left Arm)   Pulse 71   Temp 98 F (36.7 C) (Oral)   Resp 15   SpO2 100%  Physical Exam Vitals and nursing note reviewed.  Constitutional:      General: He is not in  acute distress.    Appearance: Normal appearance. He is not ill-appearing, toxic-appearing or diaphoretic.  HENT:     Head: Normocephalic and atraumatic.     Mouth/Throat:     Mouth: Mucous membranes are dry.  Eyes:     General: No scleral icterus.    Extraocular Movements: Extraocular movements intact.     Conjunctiva/sclera: Conjunctivae normal.     Pupils: Pupils are equal, round, and reactive to light.  Cardiovascular:     Rate and Rhythm: Normal rate and regular rhythm.     Heart sounds: No murmur heard. Pulmonary:     Effort: Pulmonary effort is normal. No respiratory distress.      Breath sounds: Normal breath sounds. No stridor. No wheezing, rhonchi or rales.  Abdominal:     General: Abdomen is flat. There is no distension.     Palpations: Abdomen is soft. There is no fluid wave, hepatomegaly, splenomegaly, mass or pulsatile mass.     Tenderness: There is abdominal tenderness. There is no right CVA tenderness, left CVA tenderness, guarding (mild RLQ) or rebound. Negative signs include Murphy's sign, Rovsing's sign, McBurney's sign and psoas sign.     Hernia: No hernia is present.  Musculoskeletal:        General: Normal range of motion.     Cervical back: Normal range of motion and neck supple.     Right lower leg: No edema.     Left lower leg: No edema.  Skin:    General: Skin is warm and dry.     Capillary Refill: Capillary refill takes less than 2 seconds.     Coloration: Skin is not jaundiced or pale.     Findings: No rash.  Neurological:     General: No focal deficit present.     Mental Status: He is alert and oriented to person, place, and time.     GCS: GCS eye subscore is 4. GCS verbal subscore is 5. GCS motor subscore is 6.     Cranial Nerves: Cranial nerves 2-12 are intact. No cranial nerve deficit, dysarthria or facial asymmetry.     Sensory: Sensation is intact.     Motor: Motor function is intact. No weakness, tremor, atrophy, abnormal muscle tone or seizure activity.     Coordination: Coordination is intact.  Psychiatric:        Mood and Affect: Mood normal.        Speech: Speech normal.        Behavior: Behavior normal. Behavior is cooperative.     ED Results / Procedures / Treatments   Labs (all labs ordered are listed, but only abnormal results are displayed) Labs Reviewed  URINALYSIS, ROUTINE W REFLEX MICROSCOPIC - Abnormal; Notable for the following components:      Result Value   Color, Urine STRAW (*)    Glucose, UA >=500 (*)    All other components within normal limits  COMPREHENSIVE METABOLIC PANEL - Abnormal; Notable for the  following components:   Sodium 132 (*)    Glucose, Bld 359 (*)    Calcium 8.8 (*)    Albumin 3.4 (*)    All other components within normal limits  CBG MONITORING, ED - Abnormal; Notable for the following components:   Glucose-Capillary 388 (*)    All other components within normal limits  CBG MONITORING, ED - Abnormal; Notable for the following components:   Glucose-Capillary 326 (*)    All other components within normal limits  CBC WITH DIFFERENTIAL/PLATELET  BETA-HYDROXYBUTYRIC ACID  LIPASE, BLOOD    EKG EKG Interpretation Date/Time:  Saturday April 12 2023 09:56:21 EDT Ventricular Rate:  64 PR Interval:  138 QRS Duration:  121 QT Interval:  427 QTC Calculation: 441 R Axis:   -10  Text Interpretation: Sinus rhythm IVCD, consider atypical RBBB Confirmed by Estanislado Pandy [phone]) on 04/12/2023 10:07:44 AM  Radiology CT ABDOMEN PELVIS W CONTRAST  Result Date: 04/12/2023 CLINICAL DATA:  Right lower quadrant abdominal pain. EXAM: CT ABDOMEN AND PELVIS WITH CONTRAST TECHNIQUE: Multidetector CT imaging of the abdomen and pelvis was performed using the standard protocol following bolus administration of intravenous contrast. RADIATION DOSE REDUCTION: This exam was performed according to the departmental dose-optimization program which includes automated exposure control, adjustment of the mA and/or kV according to patient size and/or use of iterative reconstruction technique. CONTRAST:  OMNIPAQUE IOHEXOL 300 MG/ML  SOLN COMPARISON:  CT abdomen pelvis dated 01/01/2023. FINDINGS: Lower chest: No acute abnormality. Hepatobiliary: No focal liver abnormality is seen. No gallstones, gallbladder wall thickening, or biliary dilatation. Pancreas: The main pancreatic duct is mildly dilated, measuring up to 5 mm in diameter. 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 No surrounding inflammatory changes. Spleen: Normal in size without focal abnormality. Adrenals/Urinary Tract: Adrenal  glands are unremarkable. Kidneys are normal, without renal calculi, focal lesion, or hydronephrosis. Bladder is unremarkable. Stomach/Bowel: Stomach is within normal limits. Appendix appears normal. No evidence of bowel wall thickening, distention, or inflammatory changes. Vascular/Lymphatic: No significant vascular findings are present. No enlarged abdominal or pelvic lymph nodes. Reproductive: Prostate is unremarkable. Other: No abdominal wall hernia or abnormality. No abdominopelvic ascites. Musculoskeletal: No acute or significant osseous findings. IMPRESSION: 1. No acute findings in the abdomen or pelvis. 2. 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 No evidence of pancreatitis. Electronically Signed   By: Romona Curls M.D.   On: 04/12/2023 10:49   DG CHEST PORT 1 VIEW  Result Date: 04/12/2023 CLINICAL DATA:  Hypertension, shortness of breath, lethargy EXAM: PORTABLE CHEST - 1 VIEW COMPARISON:  03/27/2023 FINDINGS: Lungs clear. Heart size and mediastinal contours are within normal limits. No effusion. Visualized bones unremarkable. IMPRESSION: No acute cardiopulmonary disease. Electronically Signed   By: Corlis Leak M.D.   On: 04/12/2023 10:12    Procedures Procedures    Medications Ordered in ED Medications  morphine (PF) 4 MG/ML injection 4 mg (4 mg Intravenous Given 04/12/23 0947)  ondansetron (ZOFRAN) injection 4 mg (4 mg Intravenous Given 04/12/23 0948)  lactated ringers bolus 1,000 mL (0 mLs Intravenous Stopped 04/12/23 1117)  iohexol (OMNIPAQUE) 300 MG/ML solution 100 mL (100 mLs Intravenous Contrast Given 04/12/23 1032)    ED Course/ Medical Decision Making/ A&P                                 Medical Decision Making Amount and/or Complexity of Data Reviewed Labs: ordered. Decision-making details documented in ED Course. Radiology: ordered. Decision-making details documented in ED Course. ECG/medicine tests: ordered. Decision-making  details documented in ED Course.  Risk Prescription drug management.   Medical Decision Making:   ANTOWAN VINYARD is a 37 y.o. male who presented to the ED today with abdominal pain detailed above.    Patient's presentation is complicated by their history of DM, HTN, HLD.  Complete initial physical exam performed, notably the patient  was in no acute distress, nontoxic-appearing.  He had mild right  lower quadrant abdominal tenderness without rebound, guarding, or peritoneal signs.  He has dry mucous membranes but no tachycardia.  Lungs clear to auscultation, no respiratory distress.  Neurologically intact.    Reviewed and confirmed nursing documentation for past medical history, family history, social history.    Initial Assessment:   With the patient's presentation of abdominal pain, differential diagnosis includes but is not limited to AAA, mesenteric ischemia, appendicitis, diverticulitis, DKA, gastritis, gastroenteritis, AMI, nephrolithiasis, pancreatitis, peritonitis, adrenal insufficiency, intestinal ischemia, constipation, UTI, SBO/LBO, splenic rupture, biliary disease, IBD, IBS, PUD, hepatitis, STD, ovarian/testicular torsion, electrolyte disturbance, DKA, dehydration, acute kidney injury, renal failure, cholecystitis, cholelithiasis, choledocholithiasis, abdominal pain of  unknown etiology.    Initial Plan:  Screening labs including CBC and Metabolic panel to evaluate for infectious or metabolic etiology of disease.  Lipase to evaluate for pancreatitis Urinalysis with reflex culture ordered to evaluate for UTI or relevant urologic/nephrologic pathology.  CT abd/pelvis to evaluate for intra-abdominal pathology EKG to evaluate for cardiac pathology DKA labs including VBG, BHB Symptomatic management Objective evaluation as reviewed   Initial Study Results:   Laboratory  All laboratory results reviewed without evidence of clinically relevant pathology.   Exceptions include: Sodium  132, glucose in the 300s, albumin 3.4   EKG EKG was reviewed independently. ST segments without concerns for elevations.   EKG: normal sinus rhythm.   Radiology:  All images reviewed independently. Agree with radiology report at this time.   CT ABDOMEN PELVIS W CONTRAST  Result Date: 04/12/2023 CLINICAL DATA:  Right lower quadrant abdominal pain. EXAM: CT ABDOMEN AND PELVIS WITH CONTRAST TECHNIQUE: Multidetector CT imaging of the abdomen and pelvis was performed using the standard protocol following bolus administration of intravenous contrast. RADIATION DOSE REDUCTION: This exam was performed according to the departmental dose-optimization program which includes automated exposure control, adjustment of the mA and/or kV according to patient size and/or use of iterative reconstruction technique. CONTRAST:  OMNIPAQUE IOHEXOL 300 MG/ML  SOLN COMPARISON:  CT abdomen pelvis dated 01/01/2023. FINDINGS: Lower chest: No acute abnormality. Hepatobiliary: No focal liver abnormality is seen. No gallstones, gallbladder wall thickening, or biliary dilatation. Pancreas: The main pancreatic duct is mildly dilated, measuring up to 5 mm in diameter. 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 No surrounding inflammatory changes. Spleen: Normal in size without focal abnormality. Adrenals/Urinary Tract: Adrenal glands are unremarkable. Kidneys are normal, without renal calculi, focal lesion, or hydronephrosis. Bladder is unremarkable. Stomach/Bowel: Stomach is within normal limits. Appendix appears normal. No evidence of bowel wall thickening, distention, or inflammatory changes. Vascular/Lymphatic: No significant vascular findings are present. No enlarged abdominal or pelvic lymph nodes. Reproductive: Prostate is unremarkable. Other: No abdominal wall hernia or abnormality. No abdominopelvic ascites. Musculoskeletal: No acute or significant osseous findings. IMPRESSION: 1. No acute findings in the abdomen or  pelvis. 2. 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 No evidence of pancreatitis. Electronically Signed   By: Romona Curls M.D.   On: 04/12/2023 10:49   DG CHEST PORT 1 VIEW  Result Date: 04/12/2023 CLINICAL DATA:  Hypertension, shortness of breath, lethargy EXAM: PORTABLE CHEST - 1 VIEW COMPARISON:  03/27/2023 FINDINGS: Lungs clear. Heart size and mediastinal contours are within normal limits. No effusion. Visualized bones unremarkable. IMPRESSION: No acute cardiopulmonary disease. Electronically Signed   By: Corlis Leak M.D.   On: 04/12/2023 10:12   CT Head Wo Contrast  Result Date: 03/27/2023 CLINICAL DATA:  Altered mental status.  Possible traumatic injury. EXAM:  CT HEAD WITHOUT CONTRAST CT CERVICAL SPINE WITHOUT CONTRAST TECHNIQUE: Multidetector CT imaging of the head and cervical spine was performed following the standard protocol without intravenous contrast. Multiplanar CT image reconstructions of the cervical spine were also generated. RADIATION DOSE REDUCTION: This exam was performed according to the departmental dose-optimization program which includes automated exposure control, adjustment of the mA and/or kV according to patient size and/or use of iterative reconstruction technique. COMPARISON:  None Available. FINDINGS: CT HEAD FINDINGS Brain: No evidence of acute infarction, hemorrhage, hydrocephalus, extra-axial collection or mass lesion/mass effect. Vascular: No hyperdense vessel or unexpected calcification. Skull: Normal. Negative for fracture or focal lesion. Sinuses/Orbits: No acute finding. Other: Small right frontal scalp hematoma. CT CERVICAL SPINE FINDINGS Alignment: Straightening and slight reversal of the normal cervical lordosis. No traumatic malalignment. Skull base and vertebrae: No acute fracture. No primary bone lesion or focal pathologic process. Soft tissues and spinal canal: No prevertebral fluid or swelling. No visible canal  hematoma. Disc levels:  Disc heights are relatively preserved. Upper chest: Negative. Other: None. IMPRESSION: 1. No acute intracranial abnormality. Small right frontal scalp hematoma. 2. No acute cervical spine fracture or traumatic listhesis. Electronically Signed   By: Obie Dredge M.D.   On: 03/27/2023 14:51   CT Cervical Spine Wo Contrast  Result Date: 03/27/2023 CLINICAL DATA:  Altered mental status.  Possible traumatic injury. EXAM: CT HEAD WITHOUT CONTRAST CT CERVICAL SPINE WITHOUT CONTRAST TECHNIQUE: Multidetector CT imaging of the head and cervical spine was performed following the standard protocol without intravenous contrast. Multiplanar CT image reconstructions of the cervical spine were also generated. RADIATION DOSE REDUCTION: This exam was performed according to the departmental dose-optimization program which includes automated exposure control, adjustment of the mA and/or kV according to patient size and/or use of iterative reconstruction technique. COMPARISON:  None Available. FINDINGS: CT HEAD FINDINGS Brain: No evidence of acute infarction, hemorrhage, hydrocephalus, extra-axial collection or mass lesion/mass effect. Vascular: No hyperdense vessel or unexpected calcification. Skull: Normal. Negative for fracture or focal lesion. Sinuses/Orbits: No acute finding. Other: Small right frontal scalp hematoma. CT CERVICAL SPINE FINDINGS Alignment: Straightening and slight reversal of the normal cervical lordosis. No traumatic malalignment. Skull base and vertebrae: No acute fracture. No primary bone lesion or focal pathologic process. Soft tissues and spinal canal: No prevertebral fluid or swelling. No visible canal hematoma. Disc levels:  Disc heights are relatively preserved. Upper chest: Negative. Other: None. IMPRESSION: 1. No acute intracranial abnormality. Small right frontal scalp hematoma. 2. No acute cervical spine fracture or traumatic listhesis. Electronically Signed   By: Obie Dredge M.D.   On: 03/27/2023 14:51   DG Chest Port 1 View  Result Date: 03/27/2023 CLINICAL DATA:  Altered mental status.  Fall EXAM: PORTABLE CHEST 1 VIEW COMPARISON:  Chest x-ray 03/24/2023 and older FINDINGS: No consolidation, pneumothorax or effusion. No edema. Normal cardiopericardial silhouette. Overlapping cardiac leads. The right inferior costophrenic angle is clipped off the edge of the film. IMPRESSION: No acute cardiopulmonary disease Electronically Signed   By: Karen Kays M.D.   On: 03/27/2023 13:27   DG Chest Portable 1 View  Result Date: 03/24/2023 CLINICAL DATA:  Chest pain EXAM: PORTABLE CHEST 1 VIEW COMPARISON:  Chest radiograph 01/16/2023 FINDINGS: The cardiomediastinal silhouette is stable and within normal limits There is no focal consolidation or pulmonary edema. There is no pleural effusion or pneumothorax There is no acute osseous abnormality. IMPRESSION: No radiographic evidence of acute cardiopulmonary process. Electronically Signed   By: Lesia Hausen  M.D.   On: 03/24/2023 20:06      Final Assessment and Plan:   37 year old male presents to ED c/o abdominal pain. Pt does have tenderness to RLQ. No previous abdominal surgeries. Afebrile, nontoxic appearing. Somewhat chronic nature of pain though worse recently. Notes concern due to family history unspecified cancer. Also notes concern due to hyperglycemia with history of DKA, HHS. No anion gap, normal BHB. Does not appear to be in DKA/HHS today.  Likely hyperglycemic secondary to accidental medication nonadherence after missing dose of insulin last night.  Given bolus of fluids with downtrending of glucose.  Will have patient resume his home regimen given lack of diabetic emergency.  With acute on chronic abdominal pain that patient reports is severe in nature as well as his family history, discussed risk/benefits of obtaining imaging today and patient agreeable to proceed.  No acute findings on CT abdomen pelvis.  No  leukocytosis, normal kidney and liver function.  No significant abnormalities on labs or workup today to explain patient's symptoms.  He has not had a previous colonoscopy.  Noted to have mild dilatation of the pancreatic duct but has a normal lipase, no epigastric or upper abdominal tenderness, do not suspect pancreatitis.  With reassuring workup, discussed findings with patient and will discharge home to resume home medication regimen and follow-up with GI.  Provided with PCP and GI follow-up.  Strict ED return precautions given, all questions answered, and stable for discharge.   Clinical Impression:  1. Right lower quadrant abdominal pain   2. Elevated blood pressure reading in office with diagnosis of hypertension   3. Hyperglycemia due to diabetes mellitus [facility])      Discharge           Final Clinical Impression(s) / ED Diagnoses Final diagnoses:  Right lower quadrant abdominal pain  Elevated blood pressure reading in office with diagnosis of hypertension  Hyperglycemia due to diabetes mellitus [facility])    Rx / DC Orders ED Discharge Orders          Ordered    Ambulatory referral to Gastroenterology        04/12/23 1107    dicyclomine (BENTYL) 20 MG tablet  3 times daily PRN        04/12/23 1108              Analilia Geddis, Lawrence [place]se[place], PA-C 04/12/23 1128    Coral Spikes, DO 04/12/23 1453

## 2023-04-12 NOTE — ED Notes (Signed)
PA asked to give only 1000 ml LR only instead of the .

## 2023-04-12 NOTE — ED Triage Notes (Signed)
Pt came in via [facility]. EMS states that they were called in by [employer]. Pt was found in his car. Pt states that he is homeless and sleeps in his car.   BP 192/124  CBG 450 HR 74 O2 100

## 2023-04-12 NOTE — Discharge Instructions (Signed)
Thank you for letting us take care of you today.   Your labs did not indicate that you are in DKA.  Your elevated blood sugar is likely due to missing the dose of your insulin last night.  We gave you fluids to help with this.  Your labs were overall normal.  We did not see any significant abnormalities on the CT scan of your abdomen.    With your history of recurrent abdominal pain and family history, I will refer you to GI for further evaluation.  They should call you within 72 hours to set up this appointment.  If you do not hear from them, call them to schedule it.  They may want to do additional testing such as a colonoscopy to rule out any underlying severe cause of your abdominal pain.  Your blood pressure today was elevated.  Please keep a log of your blood pressure and follow-up with primary care.  I provided 2 primary care clinics that you may follow-up with.  Continue your home blood pressure medication and diabetic medications.  Return to the ED for worsening condition.

## 2023-04-14 ENCOUNTER — Other Ambulatory Visit (HOSPITAL_COMMUNITY): Payer: Self-pay

## 2023-04-20 ENCOUNTER — Encounter (HOSPITAL_COMMUNITY): Payer: Self-pay

## 2023-04-20 ENCOUNTER — Other Ambulatory Visit: Payer: Self-pay

## 2023-04-20 ENCOUNTER — Observation Stay (HOSPITAL_COMMUNITY)
Admission: EM | Admit: 2023-04-20 | Discharge: 2023-04-21 | Disposition: A | Discharge status: Home / Self Care | Payer: Self-pay | Attending: Internal Medicine | Admitting: Internal Medicine

## 2023-04-20 ENCOUNTER — Emergency Department (HOSPITAL_COMMUNITY): Payer: Self-pay

## 2023-04-20 DIAGNOSIS — Z720 Tobacco use: Secondary | ICD-10-CM | POA: Diagnosis present

## 2023-04-20 DIAGNOSIS — E1159 Type 2 diabetes mellitus with other circulatory complications: Secondary | ICD-10-CM | POA: Diagnosis present

## 2023-04-20 DIAGNOSIS — E11649 Type 2 diabetes mellitus with hypoglycemia without coma: Secondary | ICD-10-CM | POA: Insufficient documentation

## 2023-04-20 DIAGNOSIS — F17200 Nicotine dependence, unspecified, uncomplicated: Secondary | ICD-10-CM | POA: Insufficient documentation

## 2023-04-20 DIAGNOSIS — E1065 Type 1 diabetes mellitus with hyperglycemia: Secondary | ICD-10-CM

## 2023-04-20 DIAGNOSIS — E1165 Type 2 diabetes mellitus with hyperglycemia: Principal | ICD-10-CM | POA: Insufficient documentation

## 2023-04-20 DIAGNOSIS — I152 Hypertension secondary to endocrine disorders: Secondary | ICD-10-CM | POA: Diagnosis present

## 2023-04-20 DIAGNOSIS — G9340 Encephalopathy, unspecified: Secondary | ICD-10-CM | POA: Diagnosis present

## 2023-04-20 DIAGNOSIS — Z79899 Other long term (current) drug therapy: Secondary | ICD-10-CM | POA: Insufficient documentation

## 2023-04-20 DIAGNOSIS — E1110 Type 2 diabetes mellitus with ketoacidosis without coma: Secondary | ICD-10-CM | POA: Insufficient documentation

## 2023-04-20 DIAGNOSIS — N179 Acute kidney failure, unspecified: Secondary | ICD-10-CM | POA: Diagnosis present

## 2023-04-20 DIAGNOSIS — I10 Essential (primary) hypertension: Secondary | ICD-10-CM | POA: Insufficient documentation

## 2023-04-20 DIAGNOSIS — R4182 Altered mental status, unspecified: Principal | ICD-10-CM

## 2023-04-20 DIAGNOSIS — G9341 Metabolic encephalopathy: Secondary | ICD-10-CM | POA: Diagnosis present

## 2023-04-20 DIAGNOSIS — Z794 Long term (current) use of insulin: Secondary | ICD-10-CM | POA: Insufficient documentation

## 2023-04-20 DIAGNOSIS — E119 Type 2 diabetes mellitus without complications: Secondary | ICD-10-CM

## 2023-04-20 DIAGNOSIS — R739 Hyperglycemia, unspecified: Secondary | ICD-10-CM | POA: Diagnosis present

## 2023-04-20 LAB — BLOOD GAS, VENOUS
Acid-Base Excess: 4 mmol/L — ABNORMAL HIGH (ref 0.0–2.0)
Bicarbonate: 29.7 mmol/L — ABNORMAL HIGH (ref 20.0–28.0)
O2 Saturation: 84.1 %
Patient temperature: 37
pCO2, Ven: 48 mmHg (ref 44–60)
pH, Ven: 7.4 (ref 7.25–7.43)
pO2, Ven: 51 mmHg — ABNORMAL HIGH (ref 32–45)

## 2023-04-20 LAB — CBC WITH DIFFERENTIAL/PLATELET
Abs Immature Granulocytes: 0.02 10*3/uL (ref 0.00–0.07)
Abs Immature Granulocytes: 0.01 10*3/uL (ref 0.00–0.07)
Basophils Absolute: 0 10*3/uL (ref 0.0–0.1)
Basophils Absolute: 0 10*3/uL (ref 0.0–0.1)
Basophils Relative: 1 %
Basophils Relative: 1 %
Eosinophils Absolute: 0.1 10*3/uL (ref 0.0–0.5)
Eosinophils Absolute: 0.1 10*3/uL (ref 0.0–0.5)
Eosinophils Relative: 2 %
Eosinophils Relative: 2 %
HCT: 39.5 % (ref 39.0–52.0)
HCT: 38.6 % — ABNORMAL LOW (ref 39.0–52.0)
Hemoglobin: 13 g/dL (ref 13.0–17.0)
Hemoglobin: 12.7 g/dL — ABNORMAL LOW (ref 13.0–17.0)
Immature Granulocytes: 0 %
Immature Granulocytes: 0 %
Lymphocytes Relative: 39 %
Lymphocytes Relative: 45 %
Lymphs Abs: 2 10*3/uL (ref 0.7–4.0)
Lymphs Abs: 2.4 10*3/uL (ref 0.7–4.0)
MCH: 28.4 pg (ref 26.0–34.0)
MCH: 28.3 pg (ref 26.0–34.0)
MCHC: 32.9 g/dL (ref 30.0–36.0)
MCHC: 32.9 g/dL (ref 30.0–36.0)
MCV: 86.4 fL (ref 80.0–100.0)
MCV: 86.2 fL (ref 80.0–100.0)
Monocytes Absolute: 0.5 10*3/uL (ref 0.1–1.0)
Monocytes Absolute: 0.5 10*3/uL (ref 0.1–1.0)
Monocytes Relative: 9 %
Monocytes Relative: 9 %
Neutro Abs: 2.5 10*3/uL (ref 1.7–7.7)
Neutro Abs: 2.2 10*3/uL (ref 1.7–7.7)
Neutrophils Relative %: 49 %
Neutrophils Relative %: 43 %
Platelets: 214 10*3/uL (ref 150–400)
Platelets: 198 10*3/uL (ref 150–400)
RBC: 4.57 MIL/uL (ref 4.22–5.81)
RBC: 4.48 MIL/uL (ref 4.22–5.81)
RDW: 12.5 % (ref 11.5–15.5)
RDW: 12.5 % (ref 11.5–15.5)
WBC: 5.1 10*3/uL (ref 4.0–10.5)
WBC: 5.3 10*3/uL (ref 4.0–10.5)
nRBC: 0 % (ref 0.0–0.2)
nRBC: 0 % (ref 0.0–0.2)

## 2023-04-20 LAB — SALICYLATE LEVEL: Salicylate Lvl: <7 mg/dL — ABNORMAL LOW (ref 7.0–30.0)

## 2023-04-20 LAB — COMPREHENSIVE METABOLIC PANEL
ALT: 18 U/L (ref 0–44)
ALT: 16 U/L (ref 0–44)
AST: 16 U/L (ref 15–41)
AST: 15 U/L (ref 15–41)
Albumin: 3.3 g/dL — ABNORMAL LOW (ref 3.5–5.0)
Albumin: 3 g/dL — ABNORMAL LOW (ref 3.5–5.0)
Alkaline Phosphatase: 79 U/L (ref 38–126)
Alkaline Phosphatase: 72 U/L (ref 38–126)
Anion gap: 9 (ref 5–15)
Anion gap: 9 (ref 5–15)
BUN: 17 mg/dL (ref 6–20)
BUN: 15 mg/dL (ref 6–20)
CO2: 27 mmol/L (ref 22–32)
CO2: 24 mmol/L (ref 22–32)
Calcium: 8.8 mg/dL — ABNORMAL LOW (ref 8.9–10.3)
Calcium: 8.9 mg/dL (ref 8.9–10.3)
Chloride: 96 mmol/L — ABNORMAL LOW (ref 98–111)
Chloride: 102 mmol/L (ref 98–111)
Creatinine, Ser: 1.39 mg/dL — ABNORMAL HIGH (ref 0.61–1.24)
Creatinine, Ser: 0.79 mg/dL (ref 0.61–1.24)
GFR, Estimated: >60 mL/min (ref >60)
GFR, Estimated: >60 mL/min (ref >60)
Glucose, Bld: 516 mg/dL (ref 70–99)
Glucose, Bld: 398 mg/dL — ABNORMAL HIGH (ref 70–99)
Potassium: 4.1 mmol/L (ref 3.5–5.1)
Potassium: 3.9 mmol/L (ref 3.5–5.1)
Sodium: 132 mmol/L — ABNORMAL LOW (ref 135–145)
Sodium: 135 mmol/L (ref 135–145)
Total Bilirubin: 0.7 mg/dL (ref 0.3–1.2)
Total Bilirubin: 0.6 mg/dL (ref 0.3–1.2)
Total Protein: 6.6 g/dL (ref 6.5–8.1)
Total Protein: 6.1 g/dL — ABNORMAL LOW (ref 6.5–8.1)

## 2023-04-20 LAB — RAPID URINE DRUG SCREEN, HOSP PERFORMED
Amphetamines: POSITIVE — AB
Barbiturates: NOT DETECTED
Benzodiazepines: NOT DETECTED
Cocaine: NOT DETECTED
Opiates: NOT DETECTED
Tetrahydrocannabinol: NOT DETECTED

## 2023-04-20 LAB — BLOOD GAS, ARTERIAL
Acid-Base Excess: 1.5 mmol/L (ref 0.0–2.0)
Bicarbonate: 26.6 mmol/L (ref 20.0–28.0)
Drawn by: 59133
O2 Saturation: 97.8 %
Patient temperature: 36.7
pCO2 arterial: 42 mmHg (ref 32–48)
pH, Arterial: 7.4 (ref 7.35–7.45)
pO2, Arterial: 80 mmHg — ABNORMAL LOW (ref 83–108)

## 2023-04-20 LAB — CBG MONITORING, ED
Glucose-Capillary: 390 mg/dL — ABNORMAL HIGH (ref 70–99)
Glucose-Capillary: 101 mg/dL — ABNORMAL HIGH (ref 70–99)
Glucose-Capillary: 121 mg/dL — ABNORMAL HIGH (ref 70–99)
Glucose-Capillary: 168 mg/dL — ABNORMAL HIGH (ref 70–99)

## 2023-04-20 LAB — LACTIC ACID, PLASMA: Lactic Acid, Venous: 1.7 mmol/L (ref 0.5–1.9)

## 2023-04-20 LAB — URINALYSIS, ROUTINE W REFLEX MICROSCOPIC
Bacteria, UA: NONE SEEN
Bilirubin Urine: NEGATIVE
Glucose, UA: >=500 mg/dL — AB
Hgb urine dipstick: NEGATIVE
Ketones, ur: NEGATIVE mg/dL
Leukocytes,Ua: NEGATIVE
Nitrite: NEGATIVE
Protein, ur: NEGATIVE mg/dL
Specific Gravity, Urine: 1.027 (ref 1.005–1.030)
pH: 6 (ref 5.0–8.0)

## 2023-04-20 LAB — MAGNESIUM: Magnesium: 1.9 mg/dL (ref 1.7–2.4)

## 2023-04-20 LAB — ACETAMINOPHEN LEVEL: Acetaminophen (Tylenol), Serum: <10 ug/mL — ABNORMAL LOW (ref 10–30)

## 2023-04-20 LAB — TSH: TSH: 2.161 u[IU]/mL (ref 0.350–4.500)

## 2023-04-20 LAB — AMMONIA: Ammonia: 27 umol/L (ref 9–35)

## 2023-04-20 LAB — GLUCOSE, CAPILLARY
Glucose-Capillary: 237 mg/dL — ABNORMAL HIGH (ref 70–99)
Glucose-Capillary: 168 mg/dL — ABNORMAL HIGH (ref 70–99)

## 2023-04-20 LAB — TROPONIN I (HIGH SENSITIVITY): Troponin I (High Sensitivity): 9 ng/L (ref <18)

## 2023-04-20 LAB — CK: Total CK: 189 U/L (ref 49–397)

## 2023-04-20 LAB — BETA-HYDROXYBUTYRIC ACID: Beta-Hydroxybutyric Acid: 0.14 mmol/L (ref 0.05–0.27)

## 2023-04-20 LAB — HEMOGLOBIN A1C
Hgb A1c MFr Bld: 12.4 % — ABNORMAL HIGH (ref 4.8–5.6)
Mean Plasma Glucose: 309.18 mg/dL

## 2023-04-20 LAB — ETHANOL: Alcohol, Ethyl (B): <10 mg/dL (ref <10)

## 2023-04-20 MED ORDER — AMLODIPINE BESYLATE 5 MG PO TABS
5.0000 mg | ORAL_TABLET | Freq: Every day | ORAL | Status: DC
  Administered 2023-04-20 – 2023-04-21 (×2): 5 mg via ORAL
  Filled 2023-04-20 (×2): qty 1

## 2023-04-20 MED ORDER — LISINOPRIL 10 MG PO TABS: 10.0000 mg | ORAL_TABLET | Freq: Every day | ORAL | Status: DC

## 2023-04-20 MED ORDER — NALOXONE HCL 2 MG/2ML IJ SOSY: 1.0000 mg | PREFILLED_SYRINGE | Freq: Once | INTRAMUSCULAR | Status: AC

## 2023-04-20 MED ORDER — INSULIN ASPART 100 UNIT/ML IJ SOLN
10.0000 [IU] | Freq: Once | INTRAMUSCULAR | Status: AC
  Administered 2023-04-20: 10 [IU] via SUBCUTANEOUS
  Filled 2023-04-20: qty 0.1

## 2023-04-20 MED ORDER — LACTATED RINGERS IV BOLUS
1000.0000 mL | Freq: Once | INTRAVENOUS | Status: AC
  Administered 2023-04-20: 1000 mL via INTRAVENOUS

## 2023-04-20 MED ORDER — ACETAMINOPHEN 325 MG PO TABS: 650.0000 mg | ORAL_TABLET | Freq: Four times a day (QID) | ORAL | Status: DC | PRN

## 2023-04-20 MED ORDER — INSULIN ASPART 100 UNIT/ML IJ SOLN
0.0000 [IU] | Freq: Three times a day (TID) | INTRAMUSCULAR | Status: DC
  Administered 2023-04-20: 3 [IU] via SUBCUTANEOUS
  Administered 2023-04-20: 7 [IU] via SUBCUTANEOUS
  Administered 2023-04-21: 4 [IU] via SUBCUTANEOUS
  Administered 2023-04-21: 7 [IU] via SUBCUTANEOUS
  Filled 2023-04-20: qty 0.2

## 2023-04-20 MED ORDER — LACTATED RINGERS IV SOLN: INTRAVENOUS | Status: DC

## 2023-04-20 MED ORDER — LABETALOL HCL 5 MG/ML IV SOLN
20.0000 mg | INTRAVENOUS | Status: DC | PRN
  Administered 2023-04-20 (×2): 20 mg via INTRAVENOUS
  Filled 2023-04-20 (×3): qty 4

## 2023-04-20 MED ORDER — LABETALOL HCL 5 MG/ML IV SOLN
20.0000 mg | Freq: Once | INTRAVENOUS | Status: AC
  Administered 2023-04-20: 20 mg via INTRAVENOUS
  Filled 2023-04-20: qty 4

## 2023-04-20 MED ORDER — ACETAMINOPHEN 650 MG RE SUPP: 650.0000 mg | Freq: Four times a day (QID) | RECTAL | Status: DC | PRN

## 2023-04-20 MED ORDER — INSULIN GLARGINE-YFGN 100 UNIT/ML ~~LOC~~ SOLN
25.0000 [IU] | Freq: Every day | SUBCUTANEOUS | Status: DC
  Administered 2023-04-20: 25 [IU] via SUBCUTANEOUS
  Filled 2023-04-20 (×2): qty 0.25

## 2023-04-20 MED ORDER — NALOXONE HCL 0.4 MG/ML IJ SOLN
INTRAMUSCULAR | Status: AC
  Filled 2023-04-20: qty 1

## 2023-04-20 MED ORDER — ONDANSETRON HCL 4 MG/2ML IJ SOLN: 4.0000 mg | Freq: Four times a day (QID) | INTRAMUSCULAR | Status: DC | PRN

## 2023-04-20 MED ORDER — INSULIN ASPART 100 UNIT/ML IJ SOLN
0.0000 [IU] | INTRAMUSCULAR | Status: DC
  Administered 2023-04-20: 15 [IU] via SUBCUTANEOUS
  Filled 2023-04-20: qty 0.15

## 2023-04-20 MED ORDER — MELATONIN 3 MG PO TABS: 3.0000 mg | ORAL_TABLET | Freq: Every evening | ORAL | Status: DC | PRN

## 2023-04-20 MED ORDER — NALOXONE HCL 2 MG/2ML IJ SOSY
PREFILLED_SYRINGE | INTRAMUSCULAR | Status: AC
  Administered 2023-04-20: 1 mg via INTRAVENOUS
  Filled 2023-04-20: qty 2

## 2023-04-20 NOTE — Progress Notes (Signed)
  Carryover admission to the Day Admitter.  I discussed this case with the EDP, Dr. Manus Gunning.  Per these discussions:   This is a 37 year old male with poorly controlled diabetes, most recent hemoglobin A1c noted to be 15% on 01/01/2023, who is being admitted with hyperglycemia and altered mental status.   The patient contacted EMS earlier this evening to report that he has been experiencing elevated glucose readings on his home glucometer.  In the ED, initial glucose in the low 500s, with normal bicarbonate, nonelevated anion gap, VBG showing 7.4/48/29.7, and beta hydroxybutyric acid of 0.14.   He is noted to be somnolent, but responds to painful stimuli, and refuses rectal temperature.  Moving all 4 extremities, without any report of meningismus.  Protecting airway.  No significant improvement in the degree of the patient's responsiveness with Narcan.  Urinary drug screen is positive for amphetamines.  He is afebrile.   CT head without acute process.  Additional labs notable for mild acute kidney injury, and urinalysis that was inconsistent with UTI.  In the ED he has received 2 L of LR as well as 10 units of subcu NovoLog.  I have placed an order for observation for further evaluation management of the above.  I have placed some additional preliminary admit orders via the adult multi-morbid admission order set. I have also ordered 1 additional liter of LR followed by continuous LR at 125 cc/h.  I have ordered every 4 hours CBG monitoring with associated sliding scale insulin, with first CBG to occur now.  Add on hemoglobin A1c level, and give the patient NPO.  If ordered morning labs in the form of CMP, CBC, serum magnesium level.    Newton Pigg, DO Hospitalist

## 2023-04-20 NOTE — ED Notes (Signed)
Patient somnolent since arrival to ED. Will respond to sternal rub or yelling his name by opening his eyes, then falls back asleep.

## 2023-04-20 NOTE — H&P (Signed)
History and Physical    Patient: [name] [name]:[id number] DOB: 28-Sep-1985 DOA: 04/20/2023 DOS: the patient was seen and examined on 04/20/2023 PCP: [name], NP  Patient coming from: Home  Chief Complaint:  Chief Complaint  Patient presents with   Hyperglycemia   Altered Mental Status   HPI: [name] [name] is a 37 y.o. male with medical history significant of type 2 diabetes, DKA, hyperlipidemia, hyperosmolar hyperglycemic state, hypertension, pseudohyponatremia, hypertension, tobacco abuse, substance-induced mood disorder who presented to the emergency department BIBEMS who assisted him at a parking lot due to complaints of hyperglycemia and chest pain.  He was initially very somnolent but he is fully awake and oriented now.  The patient initially did not endorse recreational drug use but subsequently stated that he took an unknown pill from a friend recently.  He has not been refrigerating his insulin as he is homeless. Positive polyuria, polydipsia for the last few days. He denied fever, chills, rhinorrhea, sore throat, wheezing or hemoptysis.  No chest pain, palpitations, diaphoresis, PND, orthopnea or pitting edema of the lower extremities.  No abdominal pain, nausea, emesis, diarrhea, constipation, melena or hematochezia.  No flank pain, dysuria, frequency or hematuria.    Lab work: UDS was positive for amphetamines.  Urinalysis had glucosuria greater than 500 mg/dL.  CBC was normal.  Unremarkable ammonia, total CK, TSH level, troponin level, lactic acid, alcohol level, salicylate, acetaminophen and beta hydroxybutyric acid.  Venous blood gas with normal pH, pCO2 of 48 and pO2 of 51 mmHg.  Bicarbonate was 29.7 and acid-base SS was 4.0 mmol/L.  CMP showed a glucose of 516 mg/dL, electrolytes and anion gap were normal after correction.  Glucose 116, BUN 17 and creatinine 1.39 mg/dL.  LFTs were normal, except for an albumin level 3.3 g/dL.  Imaging: CT head without contrast with no  acute intracranial process.  ED course: Initial vital signs were temperature 97.9 F, pulse 99, respiration 18, BP 179/101 mmHg O2 sat 100% on room air.  The patient received insulin 10 units SQ x 1, labetalol 20 mg IVP x 1, LR 3000 mm bolus and 1 mg of naloxone IVP.   Review of Systems: As mentioned in the history of present illness. All other systems reviewed and are negative. Past Medical History:  Diagnosis Date   Diabetes mellitus without complication (HCC)    DKA (diabetic ketoacidosis) (HCC) 05/08/2022   Hyperlipidemia    Hyperosmolar hyperglycemic state (HHS) (HCC) 05/08/2022   Hypertension    Hypoglycemia 07/02/2019   Pseudohyponatremia 05/08/2022   Past Surgical History:  Procedure Laterality Date   HAND SURGERY     Social History:  reports that he has an unknown smoking status. He has never used smokeless tobacco. He reports current drug use. He reports that he does not drink alcohol.  No Known Allergies  Family History  Family history unknown: Yes    Prior to Admission medications   Medication Sig Start Date End Date Taking? Authorizing Provider  blood glucose meter kit and supplies KIT Use up to four times daily as directed to test blood sugar 01/02/23   Rhetta Mura, MD  dicyclomine (BENTYL) 20 MG tablet Take 1 tablet (20 mg total) by mouth 3 (three) times daily as needed for up to 5 days for spasms. 04/12/23 04/19/23  Gowens, Mariah L, PA-C  glucose blood (ONETOUCH ULTRA TEST) test strip Use 1 strip to check blood sugar in the morning, at noon, in the evening, and at bedtime. 03/24/23  Elayne Snare K, DO  insulin glargine (LANTUS) 100 UNIT/ML Solostar Pen Inject 40 Units into the skin at bedtime. 03/24/23 07/22/23  Elayne Snare K, DO  insulin lispro (HUMALOG) 100 UNIT/ML KwikPen Inject 20 Units into the skin 3 (three) times daily before meals. 03/24/23 07/22/23  Elayne Snare K, DO  Insulin Pen Needle (PEN NEEDLES 31GX5/16") 31G X 8 MM MISC Use to  inject insulin in the morning, at noon, in the evening, and at bedtime. 03/24/23   Elayne Snare K, DO  Lancets MISC Use 1 each in the morning, at noon, in the evening, and at bedtime. 03/24/23   Elayne Snare K, DO  lisinopril (ZESTRIL) 10 MG tablet Take 1 tablet (10 mg total) by mouth daily. 11/05/22 01/02/23  Gowens, Mariah L, PA-C  nicotine (NICODERM CQ - DOSED IN MG/24 HOURS) 14 mg/24hr patch Place 1 patch (14 mg total) onto the skin daily. 01/02/23   Rhetta Mura, MD    Physical Exam: Vitals:   04/20/23 0600 04/20/23 0700 04/20/23 0715 04/20/23 0730  BP: 139/83 (!) 135/90 132/84 137/84  Pulse: 86 82    Resp: 17 10 11 17   Temp:    97.8 F (36.6 C)  TempSrc:      SpO2: 100% 98% 98% 100%  Weight:      Height:       Physical Exam Vitals and nursing note reviewed.  Constitutional:      General: He is awake. He is not in acute distress.    Appearance: Normal appearance. He is obese.  HENT:     Head: Normocephalic.     Nose: No rhinorrhea.     Mouth/Throat:     Mouth: Mucous membranes are dry.  Eyes:     General: No scleral icterus.    Pupils: Pupils are equal, round, and reactive to light.  Neck:     Vascular: No JVD.  Cardiovascular:     Rate and Rhythm: Normal rate and regular rhythm.     Heart sounds: S1 normal and S2 normal.  Pulmonary:     Effort: Pulmonary effort is normal.     Breath sounds: Normal breath sounds.  Abdominal:     General: Bowel sounds are normal.     Palpations: Abdomen is soft.     Tenderness: There is no abdominal tenderness.  Musculoskeletal:     Cervical back: Neck supple.     Right lower leg: No edema.     Left lower leg: No edema.  Skin:    General: Skin is warm and dry.  Neurological:     General: No focal deficit present.     Mental Status: He is alert and oriented to person, place, and time.  Psychiatric:        Mood and Affect: Mood normal.        Behavior: Behavior normal. Behavior is cooperative.     Data  Reviewed:  Results are pending, will review when available. 05/18/2022 transthoracic echocardiogram. IMPRESSIONS:   1. Left ventricular ejection fraction, by estimation, is 60 to 65%. The  left ventricle has normal function. The left ventricle has no regional  wall motion abnormalities. Left ventricular diastolic parameters were  normal. There is the interventricular  septum is flattened in systole, consistent with right ventricular pressure  overload.   2. Right ventricular systolic function is normal. The right ventricular  size is normal. There is normal pulmonary artery systolic pressure.   EKG: Vent. rate 99 BPM PR interval 141 ms QRS  duration 108 ms QT/QTcB 365/469 ms P-R-T axes 53 -2 30 Sinus rhythm Incomplete RBBB and LAFB Low voltage, precordial leads RSR' in V1 or V2, right VCD or RVH  Assessment and Plan: Principal Problem:   DM2 (diabetes mellitus, type 2) (HCC) Presenting with:   Hyperglycemia Observation/MedSurg. Continue IV fluids. 25 units SQ at bedtime. Consult TOC team for help with prescriptions -He is also homeless/unable to refrigerate his insulin.  Active Problems:   HTN (hypertension) Hold lisinopril for now. Begin amlodipine 5 mg p.o. daily. Labetalol IVP as needed.    AKI (acute kidney injury) (HCC) Continue IV fluids. Hold ARB/ACE. Avoid hypotension. Avoid nephrotoxins. Monitor intake and output. Monitor renal function/electrolytes.    Advance Care Planning:   Code Status: Full Code   Consults:   Family Communication:   Severity of Illness: The appropriate patient status for this patient is OBSERVATION. Observation status is judged to be reasonable and necessary in order to provide the required intensity of service to ensure the patient's safety. The patient's presenting symptoms, physical exam findings, and initial radiographic and laboratory data in the context of their medical condition is felt to place them at decreased risk for  further clinical deterioration. Furthermore, it is anticipated that the patient will be medically stable for discharge from the hospital within 2 midnights of admission.   Author: Bobette Mo, MD 04/20/2023 8:25 AM  For on call review [email or web address].   This document was prepared using Dragon voice recognition software and may contain some unintended transcription errors.

## 2023-04-20 NOTE — ED Notes (Signed)
ED TO INPATIENT HANDOFF REPORT  ED Nurse Name and Phone #: Meredeth Ide Name/Age/Gender Barbette Or 37 y.o. male Room/Bed: WA17/WA17  Code Status   Code Status: Full Code  Home/SNF/Other Home Patient oriented to: CAOx4 Is this baseline? Yes   Triage Complete: Triage complete  Chief Complaint Hyperglycemia [R73.9]  Triage Note Patient arrives with [facility] from parking lot. Patient c/o chest pain and low blood sugar; patient had CBG of 424 per EMS. Patient was give 500 mL bolus of NS by EMS. Per EMS, patient was a&o x4 on scene; upon arrival to ED, patient "had increasingly become altered." Patient has hx of drug use; denies using drugs this evening.   Allergies No Known Allergies  Level of Care/Admitting Diagnosis ED Disposition     ED Disposition  Admit   Condition  --   Comment  Hospital Area: [facility] [[id number]]  Level of Care: Progressive [102]  Admit to Progressive based on following criteria: MULTISYSTEM THREATS such as stable sepsis, metabolic/electrolyte imbalance with or without encephalopathy that is responding to early treatment.  May place patient in observation at [facility] if equivalent level of care is available:: No  Covid Evaluation: Asymptomatic - no recent exposure (last 10 days) testing not required  Diagnosis: Hyperglycemia [536644]  Admitting Physician: Angie Fava [[id number]]  Attending Physician: Angie Fava [[id number]]          B Medical/Surgery History Past Medical History:  Diagnosis Date   Diabetes mellitus without complication (HCC)    DKA (diabetic ketoacidosis) (HCC) 05/08/2022   HTN (hypertension) 05/08/2022   Hyperlipidemia    Hyperosmolar hyperglycemic state (HHS) (HCC) 05/08/2022   Hypertension    Hypoglycemia 07/02/2019   Pseudohyponatremia 05/08/2022   Past Surgical History:  Procedure Laterality Date   HAND SURGERY       A IV Location/Drains/Wounds Patient  Lines/Drains/Airways Status     Active Line/Drains/Airways     Name Placement date Placement time Site Days   Peripheral IV 04/20/23 18 G Left Antecubital 04/20/23  0100  Antecubital  less than 1            Intake/Output Last 24 hours  Intake/Output Summary (Last 24 hours) at 04/20/2023 1228 Last data filed at 04/20/2023 0553 Gross per 24 hour  Intake 3000.18 ml  Output --  Net 3000.18 ml    Labs/Imaging Results for orders placed or performed during the hospital encounter of 04/20/23 (from the past 48 hour(s))  CBC with Differential     Status: None   Collection Time: 04/20/23  1:34 AM  Result Value Ref Range   WBC 5.1 4.0 - 10.5 K/uL   RBC 4.57 4.22 - 5.81 MIL/uL   Hemoglobin 13.0 13.0 - 17.0 g/dL   HCT 64.3 32.9 - 51.8 %   MCV 86.4 80.0 - 100.0 fL   MCH 28.4 26.0 - 34.0 pg   MCHC 32.9 30.0 - 36.0 g/dL   RDW 84.1 66.0 - 63.0 %   Platelets 214 150 - 400 K/uL   nRBC 0.0 0.0 - 0.2 %   Neutrophils Relative % 49 %   Neutro Abs 2.5 1.7 - 7.7 K/uL   Lymphocytes Relative 39 %   Lymphs Abs 2.0 0.7 - 4.0 K/uL   Monocytes Relative 9 %   Monocytes Absolute 0.5 0.1 - 1.0 K/uL   Eosinophils Relative 2 %   Eosinophils Absolute 0.1 0.0 - 0.5 K/uL   Basophils Relative 1 %  Basophils Absolute 0.0 0.0 - 0.1 K/uL   Immature Granulocytes 0 %   Abs Immature Granulocytes 0.02 0.00 - 0.07 K/uL    Comment: Performed at [facility], [address] [place], [place] [address]  Comprehensive metabolic panel     Status: Abnormal   Collection Time: 04/20/23  1:34 AM  Result Value Ref Range   Sodium 132 (L) 135 - 145 mmol/L   Potassium 4.1 3.5 - 5.1 mmol/L   Chloride 96 (L) 98 - 111 mmol/L   CO2 27 22 - 32 mmol/L   Glucose, Bld 516 (HH) 70 - 99 mg/dL    Comment: CRITICAL RESULT CALLED TO, READ BACK BY AND VERIFIED WITH LIVINGSTON K. EMT @250  04/20/23 MCLEAN K. Glucose reference range applies only to samples taken after fasting for at least 8 hours.    BUN 17 6 - 20  mg/dL   Creatinine, Ser 0.98 (H) 0.61 - 1.24 mg/dL   Calcium 8.8 (L) 8.9 - 10.3 mg/dL   Total Protein 6.6 6.5 - 8.1 g/dL   Albumin 3.3 (L) 3.5 - 5.0 g/dL   AST 16 15 - 41 U/L   ALT 18 0 - 44 U/L   Alkaline Phosphatase 79 38 - 126 U/L   Total Bilirubin 0.7 0.3 - 1.2 mg/dL   GFR, Estimated >11 >91 mL/min    Comment: (NOTE) Calculated using the CKD-EPI Creatinine Equation (2021)    Anion gap 9 5 - 15    Comment: Performed at [facility], [address], [place], [place] [address]  Troponin I (High Sensitivity)     Status: None   Collection Time: 04/20/23  1:34 AM  Result Value Ref Range   Troponin I (High Sensitivity) 9 <18 ng/L    Comment: (NOTE) Elevated high sensitivity troponin I (hsTnI) values and significant  changes across serial measurements may suggest ACS but many other  chronic and acute conditions are known to elevate hsTnI results.  Refer to the "Links" section for chest pain algorithms and additional  guidance. Performed at [facility], [address], [place], [place] [address]   Acetaminophen level     Status: Abnormal   Collection Time: 04/20/23  1:34 AM  Result Value Ref Range   Acetaminophen (Tylenol), Serum <10 (L) 10 - 30 ug/mL    Comment: (NOTE) Therapeutic concentrations vary significantly. A range of 10-30 ug/mL  may be an effective concentration for many patients. However, some  are best treated at concentrations outside of this range. Acetaminophen concentrations >150 ug/mL at 4 hours after ingestion  and >50 ug/mL at 12 hours after ingestion are often associated with  toxic reactions.  Performed at [facility], [address] [place], [place] [address]   Salicylate level     Status: Abnormal   Collection Time: 04/20/23  1:34 AM  Result Value Ref Range   Salicylate Lvl <7.0 (L) 7.0 - 30.0 mg/dL    Comment: Performed at [facility], [address] [place], [place]  [address]  Ethanol     Status: None   Collection Time: 04/20/23  1:34 AM  Result Value Ref Range   Alcohol, Ethyl (B) <10 <10 mg/dL    Comment: (NOTE) Lowest detectable limit for serum alcohol is 10 mg/dL.  For medical purposes only. Performed at [facility], [address], [place], [place] [address]   Rapid urine drug screen (hospital performed)     Status: Abnormal  Collection Time: 04/20/23  1:34 AM  Result Value Ref Range   Opiates NONE DETECTED NONE DETECTED   Cocaine NONE DETECTED NONE DETECTED   Benzodiazepines NONE DETECTED NONE DETECTED   Amphetamines POSITIVE (A) NONE DETECTED   Tetrahydrocannabinol NONE DETECTED NONE DETECTED   Barbiturates NONE DETECTED NONE DETECTED    Comment: (NOTE) DRUG SCREEN FOR MEDICAL PURPOSES ONLY.  IF CONFIRMATION IS NEEDED FOR ANY PURPOSE, NOTIFY LAB WITHIN 5 DAYS.  LOWEST DETECTABLE LIMITS FOR URINE DRUG SCREEN Drug Class                     Cutoff (ng/mL) Amphetamine and metabolites    1000 Barbiturate and metabolites    200 Benzodiazepine                 200 Opiates and metabolites        300 Cocaine and metabolites        300 THC                            50 Performed at [facility], [address] [place], [place] [address]   Urinalysis, Routine w reflex microscopic -Urine, Clean Catch     Status: Abnormal   Collection Time: 04/20/23  1:34 AM  Result Value Ref Range   Color, Urine STRAW (A) YELLOW   APPearance CLEAR CLEAR   Specific Gravity, Urine 1.027 1.005 - 1.030   pH 6.0 5.0 - 8.0   Glucose, UA >=500 (A) NEGATIVE mg/dL   Hgb urine dipstick NEGATIVE NEGATIVE   Bilirubin Urine NEGATIVE NEGATIVE   Ketones, ur NEGATIVE NEGATIVE mg/dL   Protein, ur NEGATIVE NEGATIVE mg/dL   Nitrite NEGATIVE NEGATIVE   Leukocytes,Ua NEGATIVE NEGATIVE   RBC / HPF 0-5 0 - 5 RBC/hpf   WBC, UA 0-5 0 - 5 WBC/hpf   Bacteria, UA NONE SEEN NONE SEEN   Squamous Epithelial / HPF 0-5 0 - 5 /HPF     Comment: Performed at [facility], [address], [place], [place] [address]  CK     Status: None   Collection Time: 04/20/23  1:34 AM  Result Value Ref Range   Total CK 189 49 - 397 U/L    Comment: Performed at [facility], [address], [place], [place] [address]  Lactic acid, plasma     Status: None   Collection Time: 04/20/23  1:34 AM  Result Value Ref Range   Lactic Acid, Venous 1.7 0.5 - 1.9 mmol/L    Comment: Performed at [facility], [address] [place], [place] [address]  Beta-hydroxybutyric acid     Status: None   Collection Time: 04/20/23  1:34 AM  Result Value Ref Range   Beta-Hydroxybutyric Acid 0.14 0.05 - 0.27 mmol/L    Comment: Performed at [facility], [address], [place], [place] [address]  Blood gas, venous (at [facility])     Status: Abnormal   Collection Time: 04/20/23  1:34 AM  Result Value Ref Range   pH, Ven 7.4 7.25 - 7.43   pCO2, Ven 48 44 - 60 mmHg   pO2, Ven 51 (H) 32 - 45 mmHg   Bicarbonate 29.7 (H) 20.0 - 28.0 mmol/L   Acid-Base Excess 4.0 (H) 0.0 - 2.0 mmol/L   O2 Saturation 84.1 %   Patient temperature 37.0     Comment: Performed at [facility], [address], [place], [place] [address]  Blood gas, arterial (at [facility])     Status: Abnormal   Collection Time: 04/20/23  3:28 AM  Result Value Ref Range   O2 Content ROOM AIR L/min   pH, Arterial 7.4 7.35 - 7.45   pCO2 arterial 42 32 - 48 mmHg   pO2, Arterial 80 (L) 83 - 108 mmHg   Bicarbonate 26.6 20.0 - 28.0 mmol/L   Acid-Base Excess 1.5 0.0 - 2.0 mmol/L   O2 Saturation 97.8 %   Patient temperature 36.7    Collection site LEFT RADIAL    Drawn by [id number]    Allens test (pass/fail) PASS PASS    Comment: Performed at [facility], [address] [place], [place] [address]  Ammonia     Status: None   Collection Time: 04/20/23  3:33 AM  Result Value Ref Range   Ammonia  27 9 - 35 umol/L    Comment: Performed at [facility], [address] [place], [place] [address]  TSH     Status: None   Collection Time: 04/20/23  3:33 AM  Result Value Ref Range   TSH 2.161 0.350 - 4.500 uIU/mL    Comment: Performed by a 3rd Generation assay with a functional sensitivity of <=0.01 uIU/mL. Performed at [facility], [address] [place], [place] [address]   CBG monitoring, ED     Status: Abnormal   Collection Time: 04/20/23  4:20 AM  Result Value Ref Range   Glucose-Capillary 390 (H) 70 - 99 mg/dL    Comment: Glucose reference range applies only to samples taken after fasting for at least 8 hours.  CBC with Differential/Platelet     Status: Abnormal   Collection Time: 04/20/23  4:32 AM  Result Value Ref Range   WBC 5.3 4.0 - 10.5 K/uL   RBC 4.48 4.22 - 5.81 MIL/uL   Hemoglobin 12.7 (L) 13.0 - 17.0 g/dL   HCT 70.6 (L) 23.7 - 62.8 %   MCV 86.2 80.0 - 100.0 fL   MCH 28.3 26.0 - 34.0 pg   MCHC 32.9 30.0 - 36.0 g/dL   RDW 31.5 17.6 - 16.0 %   Platelets 198 150 - 400 K/uL   nRBC 0.0 0.0 - 0.2 %   Neutrophils Relative % 43 %   Neutro Abs 2.2 1.7 - 7.7 K/uL   Lymphocytes Relative 45 %   Lymphs Abs 2.4 0.7 - 4.0 K/uL   Monocytes Relative 9 %   Monocytes Absolute 0.5 0.1 - 1.0 K/uL   Eosinophils Relative 2 %   Eosinophils Absolute 0.1 0.0 - 0.5 K/uL   Basophils Relative 1 %   Basophils Absolute 0.0 0.0 - 0.1 K/uL   Immature Granulocytes 0 %   Abs Immature Granulocytes 0.01 0.00 - 0.07 K/uL    Comment: Performed at [facility], [address], [place], [place] [address]  Comprehensive metabolic panel     Status: Abnormal   Collection Time: 04/20/23  4:32 AM  Result Value Ref Range   Sodium 135 135 - 145 mmol/L   Potassium 3.9 3.5 - 5.1 mmol/L   Chloride 102 98 - 111 mmol/L   CO2 24 22 - 32 mmol/L   Glucose, Bld 398 (H) 70 - 99 mg/dL    Comment: Glucose reference range applies only to samples taken  after fasting for at least 8 hours.   BUN 15 6 -  20 mg/dL   Creatinine, Ser 1.61 0.61 - 1.24 mg/dL   Calcium 8.9 8.9 - 09.6 mg/dL   Total Protein 6.1 (L) 6.5 - 8.1 g/dL   Albumin 3.0 (L) 3.5 - 5.0 g/dL   AST 15 15 - 41 U/L   ALT 16 0 - 44 U/L   Alkaline Phosphatase 72 38 - 126 U/L   Total Bilirubin 0.6 0.3 - 1.2 mg/dL   GFR, Estimated >04 >54 mL/min    Comment: (NOTE) Calculated using the CKD-EPI Creatinine Equation (2021)    Anion gap 9 5 - 15    Comment: Performed at [facility], [address], [place], [place] [address]  Magnesium     Status: None   Collection Time: 04/20/23  4:32 AM  Result Value Ref Range   Magnesium 1.9 1.7 - 2.4 mg/dL    Comment: Performed at [facility], [address], [place], [place] [address]  Hemoglobin A1c     Status: Abnormal   Collection Time: 04/20/23  4:32 AM  Result Value Ref Range   Hgb A1c MFr Bld 12.4 (H) 4.8 - 5.6 %    Comment: (NOTE) Pre diabetes:          5.7%-6.4%  Diabetes:              >6.4%  Glycemic control for   <7.0% adults with diabetes    Mean Plasma Glucose 309.18 mg/dL    Comment: Performed at [facility], [address], [place], [place] [address]  CBG monitoring, ED     Status: Abnormal   Collection Time: 04/20/23  7:38 AM  Result Value Ref Range   Glucose-Capillary 101 (H) 70 - 99 mg/dL    Comment: Glucose reference range applies only to samples taken after fasting for at least 8 hours.  CBG monitoring, ED     Status: Abnormal   Collection Time: 04/20/23 10:05 AM  Result Value Ref Range   Glucose-Capillary 121 (H) 70 - 99 mg/dL    Comment: Glucose reference range applies only to samples taken after fasting for at least 8 hours.  CBG monitoring, ED     Status: Abnormal   Collection Time: 04/20/23 12:07 PM  Result Value Ref Range   Glucose-Capillary 168 (H) 70 - 99 mg/dL    Comment: Glucose reference range applies only to samples taken after fasting for at least 8  hours.   CT Head Wo Contrast  Result Date: 04/20/2023 CLINICAL DATA:  Hypertension, unresponsive, altered mental status EXAM: CT HEAD WITHOUT CONTRAST TECHNIQUE: Contiguous axial images were obtained from the base of the skull through the vertex without intravenous contrast. RADIATION DOSE REDUCTION: This exam was performed according to the departmental dose-optimization program which includes automated exposure control, adjustment of the mA and/or kV according to patient size and/or use of iterative reconstruction technique. COMPARISON:  03/27/2023 FINDINGS: Brain: No evidence of acute infarction, hemorrhage, mass, mass effect, or midline shift. No hydrocephalus or extra-axial fluid collection. Vascular: No hyperdense vessel. Skull: Negative for fracture or focal lesion. Sinuses/Orbits: No acute finding.  Dysconjugate gaze. Other: The mastoid air cells are well aerated. IMPRESSION: No acute intracranial process. Electronically Signed   By: Wiliam Ke M.D.   On: 04/20/2023 02:45    Pending Labs Unresulted Labs (From admission, onward)    None       Vitals/Pain Today's Vitals   04/20/23 0700 04/20/23 0715 04/20/23 0730 04/20/23 1000  BP: (!) 135/90 132/84 137/84 Marland Kitchen)  135/100  Pulse: 82   81  Resp: 10 11 17  (!) 21  Temp:   97.8 F (36.6 C) 98.5 F (36.9 C)  TempSrc:      SpO2: 98% 98% 100% 100%  Weight:      Height:        Isolation Precautions No active isolations  Medications Medications  acetaminophen (TYLENOL) tablet 650 mg (has no administration in time range)    Or  acetaminophen (TYLENOL) suppository 650 mg (has no administration in time range)  melatonin tablet 3 mg (has no administration in time range)  ondansetron (ZOFRAN) injection 4 mg (has no administration in time range)  lactated ringers infusion ( Intravenous New Bag/Given 04/20/23 0437)  insulin aspart (novoLOG) injection 0-20 Units (3 Units Subcutaneous Given 04/20/23 1014)  naloxone (NARCAN) 0.4 MG/ML  injection ( Intravenous Given 04/20/23 0123)  naloxone [facility]) injection 1 mg (1 mg Intravenous Given 04/20/23 0128)  lactated ringers bolus 1,000 mL (0 mLs Intravenous Stopped 04/20/23 0437)  lactated ringers bolus 1,000 mL (0 mLs Intravenous Stopped 04/20/23 0437)  insulin aspart (novoLOG) injection 10 Units (10 Units Subcutaneous Given 04/20/23 0316)  labetalol (NORMODYNE) injection 20 mg (20 mg Intravenous Given 04/20/23 0340)  lactated ringers bolus 1,000 mL (0 mLs Intravenous Stopped 04/20/23 0553)    Mobility walks     Focused Assessments Cardiac Assessment Handoff:    Lab Results  Component Value Date   CKTOTAL 189 04/20/2023   CKMB 1.3 06/05/2008   TROPONINI 0.01        NO INDICATION OF MYOCARDIAL INJURY. 06/05/2008   Lab Results  Component Value Date   DDIMER 0.28 04/30/2022   Does the Patient currently have chest pain? No    R Recommendations: See Admitting Provider Note  Report given to:   Additional Notes:

## 2023-04-20 NOTE — ED Notes (Signed)
This RN called lab and spoke to Kim to nofify her VBG is being tubed down to lab.

## 2023-04-20 NOTE — Progress Notes (Signed)
ABG collected and send down to lab for analysis. Lab notified.

## 2023-04-20 NOTE — ED Provider Notes (Signed)
[place] EMERGENCY DEPARTMENT AT [facility] Provider Note   CSN: [id number] Arrival date & time: 04/20/23  0057     History  Chief Complaint  Patient presents with   Hyperglycemia   Altered Mental Status    [name] [name] is a 37 y.o. male.  Level 5 caveat for altered mental status.  Patient apparently called EMS himself for concern for low blood sugar and chest pain.  On EMS arrival patient had a blood sugar of 424.Patient was awake and ambulatory patient was awake and ambulatory for EMS but now he is somnolent and difficult to arouse with pinpoint pupils.  Denies using any drugs this evening.  Was given Narcan on arrival without response.  Pupils are pinpoint he does not respond to answering questions.  Patient is awake to verbal and painful stimuli but does not respond to questions appropriately.  Denies using any drugs or alcohol tonight.  Blood sugar 424 on EMS arrival.  Stable vital signs.  Denies any pain.  The history is provided by the patient and the EMS personnel. The history is limited by the condition of the patient.  Hyperglycemia Associated symptoms: altered mental status   Altered Mental Status      Home Medications Prior to Admission medications   Medication Sig Start Date End Date Taking? Authorizing Provider  blood glucose meter kit and supplies KIT Use up to four times daily as directed to test blood sugar 01/02/23   Rhetta Mura, MD  dicyclomine (BENTYL) 20 MG tablet Take 1 tablet (20 mg total) by mouth 3 (three) times daily as needed for up to 5 days for spasms. 04/12/23 04/19/23  Gowens, Mariah L, PA-C  glucose blood (ONETOUCH ULTRA TEST) test strip Use 1 strip to check blood sugar in the morning, at noon, in the evening, and at bedtime. 03/24/23   Elayne Snare K, DO  insulin glargine (LANTUS) 100 UNIT/ML Solostar Pen Inject 40 Units into the skin at bedtime. 03/24/23 07/22/23  Elayne Snare K, DO  insulin lispro (HUMALOG) 100  UNIT/ML KwikPen Inject 20 Units into the skin 3 (three) times daily before meals. 03/24/23 07/22/23  Elayne Snare K, DO  Insulin Pen Needle (PEN NEEDLES 31GX5/16") 31G X 8 MM MISC Use to inject insulin in the morning, at noon, in the evening, and at bedtime. 03/24/23   Elayne Snare K, DO  Lancets MISC Use 1 each in the morning, at noon, in the evening, and at bedtime. 03/24/23   Elayne Snare K, DO  lisinopril (ZESTRIL) 10 MG tablet Take 1 tablet (10 mg total) by mouth daily. 11/05/22 01/02/23  Gowens, Mariah L, PA-C  nicotine (NICODERM CQ - DOSED IN MG/24 HOURS) 14 mg/24hr patch Place 1 patch (14 mg total) onto the skin daily. 01/02/23   Rhetta Mura, MD      Allergies    Patient has no known allergies.    Review of Systems   Review of Systems  Unable to perform ROS: Mental status change    Physical Exam Updated Vital Signs BP (!) 179/101   Pulse 99   Resp 18   Ht 5\' 11"  (1.803 m)   Wt 105 kg   SpO2 100%   BMI 32.29 kg/m  Physical Exam Vitals and nursing note reviewed.  Constitutional:      General: He is not in acute distress.    Appearance: He is well-developed.     Comments: Somnolent, arouses to pain minimally opens eyes but does not speak or  answer question  HENT:     Head: Normocephalic and atraumatic.     Mouth/Throat:     Pharynx: No oropharyngeal exudate.  Eyes:     Conjunctiva/sclera: Conjunctivae normal.     Pupils: Pupils are equal, round, and reactive to light.     Comments: Constricted pupils bilaterally Disconjugate gaze  Neck:     Comments: No meningismus. Cardiovascular:     Rate and Rhythm: Normal rate and regular rhythm.     Heart sounds: Normal heart sounds. No murmur heard. Pulmonary:     Effort: Pulmonary effort is normal. No respiratory distress.     Breath sounds: Normal breath sounds.  Abdominal:     Palpations: Abdomen is soft.     Tenderness: There is no abdominal tenderness. There is no guarding or rebound.   Musculoskeletal:        General: No tenderness. Normal range of motion.     Cervical back: Normal range of motion and neck supple.  Skin:    General: Skin is warm.  Neurological:     Mental Status: He is alert.     Cranial Nerves: No cranial nerve deficit.     Motor: No abnormal muscle tone.     Coordination: Coordination normal.     Comments: obtunded responds to pain only, does not speak or answer questions.  Moves all extremities.  Psychiatric:        Behavior: Behavior normal.     ED Results / Procedures / Treatments   Labs (all labs ordered are listed, but only abnormal results are displayed) Labs Reviewed  COMPREHENSIVE METABOLIC PANEL - Abnormal; Notable for the following components:      Result Value   Sodium 132 (*)    Chloride 96 (*)    Glucose, Bld 516 (*)    Creatinine, Ser 1.39 (*)    Calcium 8.8 (*)    Albumin 3.3 (*)    All other components within normal limits  ACETAMINOPHEN LEVEL - Abnormal; Notable for the following components:   Acetaminophen (Tylenol), Serum <10 (*)    All other components within normal limits  SALICYLATE LEVEL - Abnormal; Notable for the following components:   Salicylate Lvl <7.0 (*)    All other components within normal limits  RAPID URINE DRUG SCREEN, HOSP PERFORMED - Abnormal; Notable for the following components:   Amphetamines POSITIVE (*)    All other components within normal limits  URINALYSIS, ROUTINE W REFLEX MICROSCOPIC - Abnormal; Notable for the following components:   Color, Urine STRAW (*)    Glucose, UA >=500 (*)    All other components within normal limits  BLOOD GAS, VENOUS - Abnormal; Notable for the following components:   pO2, Ven 51 (*)    Bicarbonate 29.7 (*)    Acid-Base Excess 4.0 (*)    All other components within normal limits  BLOOD GAS, ARTERIAL - Abnormal; Notable for the following components:   pO2, Arterial 80 (*)    All other components within normal limits  CBC WITH DIFFERENTIAL/PLATELET -  Abnormal; Notable for the following components:   Hemoglobin 12.7 (*)    HCT 38.6 (*)    All other components within normal limits  COMPREHENSIVE METABOLIC PANEL - Abnormal; Notable for the following components:   Glucose, Bld 398 (*)    Total Protein 6.1 (*)    Albumin 3.0 (*)    All other components within normal limits  CBG MONITORING, ED - Abnormal; Notable for the following components:  Glucose-Capillary 390 (*)    All other components within normal limits  CBG MONITORING, ED - Abnormal; Notable for the following components:   Glucose-Capillary 101 (*)    All other components within normal limits  CBC WITH DIFFERENTIAL/PLATELET  ETHANOL  CK  LACTIC ACID, PLASMA  BETA-HYDROXYBUTYRIC ACID  AMMONIA  TSH  MAGNESIUM  HEMOGLOBIN A1C  TROPONIN I (HIGH SENSITIVITY)    EKG EKG Interpretation Date/Time:  Sunday April 20 2023 01:16:56 EDT Ventricular Rate:  99 PR Interval:  141 QRS Duration:  108 QT Interval:  365 QTC Calculation: 469 R Axis:   -2  Text Interpretation: Sinus rhythm Incomplete RBBB and LAFB Low voltage, precordial leads RSR' in V1 or V2, right VCD or RVH duplicate Confirmed by Glynn Octave [id number]) on 04/20/2023 1:38:35 AM  Radiology CT Head Wo Contrast  Result Date: 04/20/2023 CLINICAL DATA:  Hypertension, unresponsive, altered mental status EXAM: CT HEAD WITHOUT CONTRAST TECHNIQUE: Contiguous axial images were obtained from the base of the skull through the vertex without intravenous contrast. RADIATION DOSE REDUCTION: This exam was performed according to the departmental dose-optimization program which includes automated exposure control, adjustment of the mA and/or kV according to patient size and/or use of iterative reconstruction technique. COMPARISON:  03/27/2023 FINDINGS: Brain: No evidence of acute infarction, hemorrhage, mass, mass effect, or midline shift. No hydrocephalus or extra-axial fluid collection. Vascular: No hyperdense vessel. Skull:  Negative for fracture or focal lesion. Sinuses/Orbits: No acute finding.  Dysconjugate gaze. Other: The mastoid air cells are well aerated. IMPRESSION: No acute intracranial process. Electronically Signed   By: Wiliam Ke M.D.   On: 04/20/2023 02:45    Procedures .Critical Care  Performed by: Glynn Octave, MD Authorized by: Glynn Octave, MD   Critical care provider statement:    Critical care time (minutes):  45   Critical care time was exclusive of:  Separately billable procedures and treating other patients   Critical care was necessary to treat or prevent imminent or life-threatening deterioration of the following conditions:  Toxidrome   Critical care was time spent personally by me on the following activities:  Development of treatment plan with patient or surrogate, discussions with consultants, evaluation of patient's response to treatment, examination of patient, ordering and review of laboratory studies, ordering and review of radiographic studies, ordering and performing treatments and interventions, pulse oximetry, re-evaluation of patient's condition, review of old charts and obtaining history from patient or surrogate   I assumed direction of critical care for this patient from another provider in my specialty: no     Care discussed with: admitting provider       Medications Ordered in ED Medications  naloxone (NARCAN) 0.4 MG/ML injection ( Intravenous Given 04/20/23 0123)  naloxone [facility]) injection 1 mg (1 mg Intravenous Given 04/20/23 0128)    ED Course/ Medical Decision Making/ A&P                                 Medical Decision Making Amount and/or Complexity of Data Reviewed Independent Historian: EMS Labs: ordered. Decision-making details documented in ED Course. Radiology: ordered and independent interpretation performed. Decision-making details documented in ED Course. ECG/medicine tests: ordered and independent interpretation performed.  Decision-making details documented in ED Course.  Risk Prescription drug management. Decision regarding hospitalization.     Altered mental Status.  Vital stable.  Hyperglycemia noted.  EKG without acute ischemia.  Patient protecting airway, no response to Narcan.  Patient given IV fluids.  Labs show hyperglycemia without DKA.  Anion gap is normal.  Drug screen positive for amphetamines.  Ethanol level undetectable, acetaminophen and salicylate level undetectable.  CT head without acute findings.  Patient remains altered.  Refuses rectal temperature but quickly falls back to sleep.  He has no meningismus.  Low suspicion for meningitis or significant infectious pathology.  Remains hyperglycemic and hypertensive.  Workup unrevealing for source of altered mental status.  Drug screen positive for amphetamines only.  No other lab abnormalities.  Will send ammonia and TSH.  Suspect likely some kind of intoxicant.  Given persistent altered mental status we will plan observation admission.  Continue IV hydration and insulin for his hyperglycemia.  Discussed with Dr. Arlean Hopping.         Final Clinical Impression(s) / ED Diagnoses Final diagnoses:  Altered mental status, unspecified altered mental status type  Hyperglycemia    Rx / DC Orders ED Discharge Orders     None         Siya Flurry, Jeannett Senior, MD 04/20/23 [phone]

## 2023-04-20 NOTE — ED Triage Notes (Signed)
Patient arrives with [facility] from parking lot. Patient c/o chest pain and low blood sugar; patient had CBG of 424 per EMS. Patient was give 500 mL bolus of NS by EMS. Per EMS, patient was a&o x4 on scene; upon arrival to ED, patient "had increasingly become altered." Patient has hx of drug use; denies using drugs this evening.

## 2023-04-20 NOTE — ED Notes (Signed)
Patient refused rectal temp, only allowed this RN to obtain oral temp.

## 2023-04-21 ENCOUNTER — Other Ambulatory Visit (HOSPITAL_COMMUNITY): Payer: Self-pay

## 2023-04-21 LAB — GLUCOSE, CAPILLARY
Glucose-Capillary: 212 mg/dL — ABNORMAL HIGH (ref 70–99)
Glucose-Capillary: 168 mg/dL — ABNORMAL HIGH (ref 70–99)

## 2023-04-21 MED ORDER — INSULIN GLARGINE 100 UNIT/ML SOLOSTAR PEN
25.0000 [IU] | PEN_INJECTOR | Freq: Every day | SUBCUTANEOUS | 0 refills | Status: DC
Start: 2023-04-21 — End: 2023-05-14
  Filled 2023-04-21: qty 6, 24d supply, fill #0

## 2023-04-21 MED ORDER — ONETOUCH ULTRA 2 W/DEVICE KIT
PACK | 0 refills | Status: DC
  Filled 2023-04-21: qty 1, 1d supply, fill #0

## 2023-04-21 MED ORDER — ONETOUCH ULTRA TEST VI STRP
ORAL_STRIP | 1 refills | Status: DC
  Filled 2023-04-21: qty 50, 12d supply, fill #0
  Filled 2023-04-21: qty 50, 15d supply, fill #0

## 2023-04-21 MED ORDER — "PEN NEEDLES 5/16"" 31G X 8 MM MISC"
1.0000 | Freq: Four times a day (QID) | 1 refills | Status: DC
  Filled 2023-04-21: qty 100, 30d supply, fill #0

## 2023-04-21 MED ORDER — INSULIN LISPRO (1 UNIT DIAL) 100 UNIT/ML (KWIKPEN)
20.0000 [IU] | PEN_INJECTOR | Freq: Three times a day (TID) | SUBCUTANEOUS | 1 refills | Status: DC
  Filled 2023-04-21: qty 15, 25d supply, fill #0

## 2023-04-21 MED ORDER — LANCETS MISC
1.0000 | Freq: Four times a day (QID) | 1 refills | Status: DC
  Filled 2023-04-21 (×2): qty 100, 25d supply, fill #0

## 2023-04-21 MED ORDER — LISINOPRIL 10 MG PO TABS
10.0000 mg | ORAL_TABLET | Freq: Every day | ORAL | 2 refills | Status: DC
  Filled 2023-04-21: qty 30, 30d supply, fill #0

## 2023-04-21 NOTE — Inpatient Diabetes Management (Signed)
Inpatient Diabetes Program Recommendations  AACE/ADA: New Consensus Statement on Inpatient Glycemic Control (2015)  Target Ranges:  Prepandial:   less than 140 mg/dL      Peak postprandial:   less than 180 mg/dL (1-2 hours)      Critically ill patients:  140 - 180 mg/dL   Lab Results  Component Value Date   GLUCAP 168 (H) 04/21/2023   HGBA1C 12.4 (H) 04/20/2023    Review of Glycemic Control  Diabetes history: DM Outpatient Diabetes medications: Lantus 30 at bedtime, Humalog 20 TID Current orders for Inpatient glycemic control: Semglee 25 at bedtime, Novolog 0-20 TID with meals  HgbA1C - 12.4%  Inpatient Diabetes Program Recommendations:    Semglee 25 at bedtime Humalog 20 TID  Spoke with pt at bedside regarding his HgbA1C of 12.4%. Pt states he was taking his insulin, but had no place to store, and it "went bad." Had previously kept it at [facility], pt got kicked out for fighting and he cannot go back. States he will likely go back to [place] and stay with family, but needs a ride back.  Monitors blood sugars with glucose meter. Has supplies. Stressed importance of getting HgbA1C down to 7% to preduce risks of long and short-term complications.  Discussed with TOC RN.  Thank you. Ailene Ards, RD, LDN, CDCES Inpatient Diabetes Coordinator [phone]

## 2023-04-21 NOTE — Progress Notes (Signed)
MATCH MEDICATION ASSISTANCE CARD Pharmacies please call [phone] for claim processing assistance.  Rx BIN: [id number] Rx Group: [id number] Rx PCN: [id number] Relationship Code: 1 Person Code: 01  Patient ID (MRN): [name]    Patient Name: [name] [name]   Patient DOB: [birth year]   Discharge Date:04/21/2023  Expiration Date: 04/29/2023 (must be filled within 7 days of discharge)   Dear Bonita Quin have been approved to have the prescriptions written by your discharging physician filled through our [facility] (Medication Assistance Through [facility]) program. This program allows for a one-time (no refills) 34-day supply of selected medications for a low copay amount.  The copay is $3.00 per prescription. For instance, if you have one prescription, you will pay $3.00; for two prescriptions, you pay $6.00; for three prescriptions, you pay $9.00; and so on. Only certain pharmacies are participating in this program with [facility]. You will need to select one of the pharmacies from the attached lists and take your prescriptions, this letter, and your photo ID to one of the participating pharmacies.  We are excited that you are able to use the [facility] program to get your medications. These prescriptions must be filled within 7 days of hospital discharge or they will no longer be valid for the [facility] program. Should you have any problems with your prescriptions please contact your case management team member at [phone] for [facility] or [phone] for [facility].  Thank you, [facility]

## 2023-04-21 NOTE — Progress Notes (Signed)
AVS reviewed w/ pt - teach back method used - no other questions at this time - Discharge meds picked up from the pharmacy by this RN  & delivered to patient. PIV removed as documented- two bus passes provided by [facility] & given to patient by this RN

## 2023-04-21 NOTE — TOC Progression Note (Addendum)
Transition of Care [facility]) - Progression Note    Patient Details  Name: [name] [name] MRN: [id number] Date of Birth: [birth year]  [facility] CM/SW Contact  [name], RN Phone [phone]  04/21/2023, 3:15 PM  Clinical Narrative:    [facility] acknowledges consult for medication assistance and education for patient about [facility]. CM has completed MATCH form but has been informed by the pharmacy that patient currently has an active MATCH form. Patient verbalized understanding that he will nedd to pick up meds from [facility] outpatient pharmacy. Patient states that he is well aware of [facility] and that he will most likely go there after d/c. [facility] is also a safe place where his insulin can be stored. Patient verbalizes understanding.        Expected Discharge Plan and Services         Expected Discharge Date: 04/21/23                                     Social Determinants of Health (SDOH) Interventions SDOH Screenings   Food Insecurity: Food Insecurity Present (04/20/2023)  Housing: Medium Risk (04/20/2023)  Transportation Needs: Unmet Transportation Needs (04/20/2023)  Utilities: At Risk (04/20/2023)  Tobacco Use: Low Risk  (04/20/2023)    Readmission Risk Interventions     No data to display
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
## 2023-04-21 NOTE — Discharge Instructions (Signed)

## 2023-04-28 ENCOUNTER — Emergency Department (HOSPITAL_COMMUNITY)
Admission: EM | Admit: 2023-04-28 | Discharge: 2023-04-28 | Disposition: A | Discharge status: Home / Self Care | Payer: Self-pay | Attending: Emergency Medicine | Admitting: Emergency Medicine

## 2023-04-28 ENCOUNTER — Other Ambulatory Visit: Payer: Self-pay

## 2023-04-28 ENCOUNTER — Encounter (HOSPITAL_COMMUNITY): Payer: Self-pay

## 2023-04-28 DIAGNOSIS — E11649 Type 2 diabetes mellitus with hypoglycemia without coma: Secondary | ICD-10-CM | POA: Insufficient documentation

## 2023-04-28 DIAGNOSIS — I10 Essential (primary) hypertension: Secondary | ICD-10-CM | POA: Insufficient documentation

## 2023-04-28 DIAGNOSIS — E162 Hypoglycemia, unspecified: Secondary | ICD-10-CM

## 2023-04-28 DIAGNOSIS — Z794 Long term (current) use of insulin: Secondary | ICD-10-CM | POA: Insufficient documentation

## 2023-04-28 LAB — COMPREHENSIVE METABOLIC PANEL
ALT: 14 U/L (ref 0–44)
AST: 21 U/L (ref 15–41)
Albumin: 3.8 g/dL (ref 3.5–5.0)
Alkaline Phosphatase: 73 U/L (ref 38–126)
Anion gap: 11 (ref 5–15)
BUN: 11 mg/dL (ref 6–20)
CO2: 25 mmol/L (ref 22–32)
Calcium: 9 mg/dL (ref 8.9–10.3)
Chloride: 100 mmol/L (ref 98–111)
Creatinine, Ser: 1.08 mg/dL (ref 0.61–1.24)
GFR, Estimated: >60 mL/min (ref >60)
Glucose, Bld: 169 mg/dL — ABNORMAL HIGH (ref 70–99)
Potassium: 3.2 mmol/L — ABNORMAL LOW (ref 3.5–5.1)
Sodium: 136 mmol/L (ref 135–145)
Total Bilirubin: 0.7 mg/dL (ref 0.3–1.2)
Total Protein: 7.2 g/dL (ref 6.5–8.1)

## 2023-04-28 LAB — CBC
HCT: 46.4 % (ref 39.0–52.0)
Hemoglobin: 15.4 g/dL (ref 13.0–17.0)
MCH: 28.4 pg (ref 26.0–34.0)
MCHC: 33.2 g/dL (ref 30.0–36.0)
MCV: 85.5 fL (ref 80.0–100.0)
Platelets: 253 10*3/uL (ref 150–400)
RBC: 5.43 MIL/uL (ref 4.22–5.81)
RDW: 12.7 % (ref 11.5–15.5)
WBC: 6.3 10*3/uL (ref 4.0–10.5)
nRBC: 0 % (ref 0.0–0.2)

## 2023-04-28 LAB — CBG MONITORING, ED
Glucose-Capillary: 70 mg/dL (ref 70–99)
Glucose-Capillary: 153 mg/dL — ABNORMAL HIGH (ref 70–99)
Glucose-Capillary: 288 mg/dL — ABNORMAL HIGH (ref 70–99)

## 2023-04-28 MED ORDER — POTASSIUM CHLORIDE CRYS ER 20 MEQ PO TBCR
40.0000 meq | EXTENDED_RELEASE_TABLET | Freq: Once | ORAL | Status: AC
  Administered 2023-04-28: 40 meq via ORAL
  Filled 2023-04-28: qty 2

## 2023-04-28 NOTE — Discharge Instructions (Addendum)
It was a pleasure caring for you today. Lab workup was reassuring. Please follow up with your primary care provider for your diabetes management. Seek emergency care if experiencing any new or worsening symptoms.
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
## 2023-04-30 ENCOUNTER — Emergency Department (HOSPITAL_COMMUNITY)
Admission: EM | Admit: 2023-04-30 | Discharge: 2023-04-30 | Disposition: A | Discharge status: Home / Self Care | Payer: Self-pay | Attending: Emergency Medicine | Admitting: Emergency Medicine

## 2023-04-30 ENCOUNTER — Encounter (HOSPITAL_COMMUNITY): Payer: Self-pay | Admitting: Emergency Medicine

## 2023-04-30 ENCOUNTER — Emergency Department (HOSPITAL_COMMUNITY): Payer: Self-pay

## 2023-04-30 DIAGNOSIS — E1065 Type 1 diabetes mellitus with hyperglycemia: Secondary | ICD-10-CM | POA: Insufficient documentation

## 2023-04-30 DIAGNOSIS — R030 Elevated blood-pressure reading, without diagnosis of hypertension: Secondary | ICD-10-CM

## 2023-04-30 DIAGNOSIS — R739 Hyperglycemia, unspecified: Secondary | ICD-10-CM

## 2023-04-30 DIAGNOSIS — R519 Headache, unspecified: Secondary | ICD-10-CM | POA: Insufficient documentation

## 2023-04-30 LAB — URINALYSIS, ROUTINE W REFLEX MICROSCOPIC
Bacteria, UA: NONE SEEN
Bilirubin Urine: NEGATIVE
Glucose, UA: >=500 mg/dL — AB
Hgb urine dipstick: NEGATIVE
Ketones, ur: NEGATIVE mg/dL
Leukocytes,Ua: NEGATIVE
Nitrite: NEGATIVE
Protein, ur: NEGATIVE mg/dL
Specific Gravity, Urine: 1.022 (ref 1.005–1.030)
pH: 5 (ref 5.0–8.0)

## 2023-04-30 LAB — CBC WITH DIFFERENTIAL/PLATELET
Abs Immature Granulocytes: 0.01 10*3/uL (ref 0.00–0.07)
Basophils Absolute: 0.1 10*3/uL (ref 0.0–0.1)
Basophils Relative: 1 %
Eosinophils Absolute: 0.1 10*3/uL (ref 0.0–0.5)
Eosinophils Relative: 3 %
HCT: 50.3 % (ref 39.0–52.0)
Hemoglobin: 15.9 g/dL (ref 13.0–17.0)
Immature Granulocytes: 0 %
Lymphocytes Relative: 38 %
Lymphs Abs: 1.7 10*3/uL (ref 0.7–4.0)
MCH: 28.5 pg (ref 26.0–34.0)
MCHC: 31.6 g/dL (ref 30.0–36.0)
MCV: 90.1 fL (ref 80.0–100.0)
Monocytes Absolute: 0.4 10*3/uL (ref 0.1–1.0)
Monocytes Relative: 9 %
Neutro Abs: 2.2 10*3/uL (ref 1.7–7.7)
Neutrophils Relative %: 49 %
Platelets: 258 10*3/uL (ref 150–400)
RBC: 5.58 MIL/uL (ref 4.22–5.81)
RDW: 12.5 % (ref 11.5–15.5)
WBC: 4.5 10*3/uL (ref 4.0–10.5)
nRBC: 0 % (ref 0.0–0.2)

## 2023-04-30 LAB — CBG MONITORING, ED
Glucose-Capillary: 220 mg/dL — ABNORMAL HIGH (ref 70–99)
Glucose-Capillary: 391 mg/dL — ABNORMAL HIGH (ref 70–99)
Glucose-Capillary: 527 mg/dL (ref 70–99)
Glucose-Capillary: 544 mg/dL (ref 70–99)
Glucose-Capillary: >600 mg/dL (ref 70–99)

## 2023-04-30 LAB — BLOOD GAS, VENOUS
Acid-Base Excess: 1.9 mmol/L (ref 0.0–2.0)
Bicarbonate: 29 mmol/L — ABNORMAL HIGH (ref 20.0–28.0)
O2 Saturation: 54.7 %
Patient temperature: 37
pCO2, Ven: 55 mm[Hg] (ref 44–60)
pH, Ven: 7.33 (ref 7.25–7.43)
pO2, Ven: 32 mm[Hg] (ref 32–45)

## 2023-04-30 LAB — BASIC METABOLIC PANEL
Anion gap: 9 (ref 5–15)
BUN: 13 mg/dL (ref 6–20)
CO2: 24 mmol/L (ref 22–32)
Calcium: 8.7 mg/dL — ABNORMAL LOW (ref 8.9–10.3)
Chloride: 96 mmol/L — ABNORMAL LOW (ref 98–111)
Creatinine, Ser: 1.11 mg/dL (ref 0.61–1.24)
GFR, Estimated: >60 mL/min (ref >60)
Glucose, Bld: 533 mg/dL (ref 70–99)
Potassium: 4.5 mmol/L (ref 3.5–5.1)
Sodium: 129 mmol/L — ABNORMAL LOW (ref 135–145)

## 2023-04-30 LAB — BETA-HYDROXYBUTYRIC ACID: Beta-Hydroxybutyric Acid: 0.19 mmol/L (ref 0.05–0.27)

## 2023-04-30 MED ORDER — LISINOPRIL 10 MG PO TABS
10.0000 mg | ORAL_TABLET | Freq: Once | ORAL | Status: AC
  Administered 2023-04-30: 10 mg via ORAL
  Filled 2023-04-30: qty 1

## 2023-04-30 MED ORDER — SODIUM CHLORIDE 0.9 % IV BOLUS
1000.0000 mL | Freq: Once | INTRAVENOUS | Status: AC
  Administered 2023-04-30: 1000 mL via INTRAVENOUS

## 2023-04-30 MED ORDER — INSULIN ASPART 100 UNIT/ML IJ SOLN
10.0000 [IU] | Freq: Once | INTRAMUSCULAR | Status: AC
  Administered 2023-04-30: 10 [IU] via SUBCUTANEOUS
  Filled 2023-04-30: qty 0.1

## 2023-04-30 NOTE — ED Notes (Signed)
Pt discharged home. Discharge information discussed. No s/s of distress observed during discharge. 

## 2023-04-30 NOTE — ED Notes (Signed)
Pt given 1 bolus of NS per verbal Dr. Nicanor Alcon in triage and it has completed.

## 2023-04-30 NOTE — ED Notes (Signed)
Multiple attempts to get blood and 2nd IV started unsuccessful at this time.

## 2023-04-30 NOTE — ED Provider Notes (Signed)
Care assumed from Roxy Horseman, PA-C at shift change pending CT head due to elevated BP and headache. See his note for full HPI.  In short, patient is a 37 year old male who presents to the ED due to vomiting.  History of type 1 diabetes.  Poorly controlled.  Frequently seen for hyperglycemia and hypoglycemia episodes.  History of polysubstance abuse.  At shift change, previous provider reassessed patient and BP in the lower 200s.  Patient complaining of a headache.  CT head ordered to rule out intracranial bleed.  Patient given dose of lisinopril (home dose) Physical Exam  BP (!) 176/105 (BP Location: Left Arm)   Pulse (!) 105   Temp 98.3 F (36.8 C) (Oral)   Resp 16   SpO2 97%   Physical Exam Vitals and nursing note reviewed.  Constitutional:      General: He is not in acute distress.    Appearance: He is not ill-appearing.  HENT:     Head: Normocephalic.  Eyes:     Pupils: Pupils are equal, round, and reactive to light.  Cardiovascular:     Rate and Rhythm: Normal rate and regular rhythm.     Pulses: Normal pulses.     Heart sounds: Normal heart sounds. No murmur heard.    No friction rub. No gallop.  Pulmonary:     Effort: Pulmonary effort is normal.     Breath sounds: Normal breath sounds.  Abdominal:     General: Abdomen is flat. There is no distension.     Palpations: Abdomen is soft.     Tenderness: There is no abdominal tenderness. There is no guarding or rebound.  Musculoskeletal:        General: Normal range of motion.     Cervical back: Neck supple.  Skin:    General: Skin is warm and dry.  Neurological:     General: No focal deficit present.     Mental Status: He is alert.  Psychiatric:        Mood and Affect: Mood normal.        Behavior: Behavior normal.     Procedures  Procedures  ED Course / MDM   Clinical Course as of 04/30/23 0750  Wed Apr 30, 2023  0742 BP(!): 159/105 [CA]    Clinical Course User Index [CA] Mannie Stabile, PA-C    Medical Decision Making Amount and/or Complexity of Data Reviewed Labs: ordered. Decision-making details documented in ED Course. Radiology: ordered and independent interpretation performed. Decision-making details documented in ED Course.  Risk Prescription drug management.   36 year old male who presents to the ED due to vomiting.  History of type 1 diabetes.  Poorly controlled.  Frequently seen for hyperglycemia and hypoglycemia episodes.  History of polysubstance abuse. At shift change, CT head pending due to elevated BP and headache.   CT head personally reviewed and interpreted which is negative for any acute abnormalities. No bleed.   Reviewed labs from previous provider.  Patient initially came in hyperglycemic with no anion gap.  VBG reassuring.  Normal pH.  CBG improved to 391 which appears to be around patient's baseline.  Patient given 2 L IV fluids and insulin from previous provider.  Low suspicion for DKA or HHS.  7:50 AM reassessed patient at bedside.  Patient resting comfortably in bed.  Patient notes he is ready to be discharged.  BP improved to 151.  Heart rate upper 90s/low 100s.  Advised patient continue taking his blood pressure medication and insulin  as previously prescribed.  Repeat CBG prior to discharge 220.  EKG sinus tachycardia, HR 102. Patient stable for discharge. Strict ED precautions discussed with patient. Patient states understanding and agrees to plan. Patient discharged home in no acute distress and stable vitals  Homeless Hx DM Has PCP     Jesusita Oka 04/30/23 0920    Bethann Berkshire, MD 05/01/23 [phone]

## 2023-04-30 NOTE — ED Triage Notes (Signed)
Pt is on the streets and at hospital for hypoglycemia. Meter for EMS reading HIGH. Pt acts lethargic. DX with type 1 when he was 17. Meth user- used 3 days ago. Trying to get to [facility] where he states he was accepted into a rehab called [facility]".

## 2023-04-30 NOTE — ED Provider Notes (Signed)
[facility]-EMERGENCY DEPT [facility] Emergency Department Provider Note MRN:  [id number]  Arrival date & time: 04/30/23     Chief Complaint   Blood Sugar Problem   History of Present Illness   [name] [name] is a 37 y.o. year-old male presents to the ED with chief complaint of vomiting.  Has hx of type 1 DM.  Is poorly controlled.  Frequently here for hyperglycemic and hypoglycemic episodes.  Hx of meth use.  States he hasn't been feeling well all day and has been vomiting.  History provided by patient.   Review of Systems  Pertinent positive and negative review of systems noted in HPI.    Physical Exam   Vitals:   04/30/23 0025  BP: (!) 164/103  Pulse: 93  Resp: 18  Temp: 98.4 F (36.9 C)  SpO2: 100%    CONSTITUTIONAL:  ill-appearing, NAD NEURO:  Alert and oriented x 3, CN 3-12 grossly intact EYES:  eyes equal and reactive ENT/NECK:  Supple, no stridor  CARDIO:  normal rate, regular rhythm, appears well-perfused  PULM:  No respiratory distress,  GI/GU:  non-distended,  MSK/SPINE:  No gross deformities, no edema, moves all extremities  SKIN:  no rash, atraumatic   *Additional and/or pertinent findings included in MDM below  Diagnostic and Interventional Summary    EKG Interpretation Date/Time:    Ventricular Rate:    PR Interval:    QRS Duration:    QT Interval:    QTC Calculation:   R Axis:      Text Interpretation:         Labs Reviewed  URINALYSIS, ROUTINE W REFLEX MICROSCOPIC - Abnormal; Notable for the following components:      Result Value   Color, Urine COLORLESS (*)    Glucose, UA >=500 (*)    All other components within normal limits  CBG MONITORING, ED - Abnormal; Notable for the following components:   Glucose-Capillary >600 (*)    All other components within normal limits  CBG MONITORING, ED - Abnormal; Notable for the following components:   Glucose-Capillary 544 (*)    All other components within normal limits  CBC WITH  DIFFERENTIAL/PLATELET  BASIC METABOLIC PANEL  BASIC METABOLIC PANEL  BASIC METABOLIC PANEL  BASIC METABOLIC PANEL  BASIC METABOLIC PANEL  BETA-HYDROXYBUTYRIC ACID  BETA-HYDROXYBUTYRIC ACID  BETA-HYDROXYBUTYRIC ACID  BLOOD GAS, VENOUS    No orders to display    Medications - No data to display   Procedures  /  Critical Care Procedures  ED Course and Medical Decision Making  I have reviewed the triage vital signs, the nursing notes, and pertinent available records from the EMR.  Social Determinants Affecting Complexity of Care: Patient is homelessness.   ED Course:    Medical Decision Making Patient here with hyperglycemia.  Arrives with a bag of candy.  Seen frequently for poorly controlled blood sugar.    Will start fluids and check labs.    No anion gap.  Doesn't appear to be in DKA based on labs.  Will continue with fluids.    BP noted to be trending up.  Will give patient his morning lisinopril.  Amount and/or Complexity of Data Reviewed Labs: ordered. Radiology: ordered.  Risk Prescription drug management.         Consultants:    Treatment and Plan: Patient signed out to oncoming team.   Plan: F/u on head CT. Ensure BP and glucose continues down trending Anticipate discharge    Final Clinical Impressions(s) /  ED Diagnoses  No diagnosis found.  ED Discharge Orders     None         Discharge Instructions Discussed with and Provided to Patient:   Discharge Instructions   None      Roxy Horseman, PA-C 04/30/23 2130    Palumbo, April, MD 04/30/23 [id number]

## 2023-04-30 NOTE — ED Notes (Addendum)
Pt's IV did not pull back at this time

## 2023-04-30 NOTE — Discharge Instructions (Addendum)
It was a pleasure taking care of you today.  As discussed, your glucose was elevated while you were in the ER.  Continue taking your insulin as prescribed.  Your blood pressure was also elevated.  Labs are reassuring.  Continue to take your medications as prescribed.  Have a recheck by PCP in 2 days.  Return to the ER for new or worsening symptoms.

## 2023-05-07 ENCOUNTER — Other Ambulatory Visit: Payer: Self-pay

## 2023-05-07 ENCOUNTER — Observation Stay (HOSPITAL_COMMUNITY)
Admission: EM | Admit: 2023-05-07 | Discharge: 2023-05-08 | Disposition: A | Discharge status: Home / Self Care | Payer: Self-pay | Attending: Family Medicine | Admitting: Family Medicine

## 2023-05-07 ENCOUNTER — Emergency Department (HOSPITAL_COMMUNITY): Payer: Self-pay

## 2023-05-07 ENCOUNTER — Encounter (HOSPITAL_COMMUNITY): Payer: Self-pay | Admitting: *Deleted

## 2023-05-07 DIAGNOSIS — Z794 Long term (current) use of insulin: Secondary | ICD-10-CM | POA: Insufficient documentation

## 2023-05-07 DIAGNOSIS — Z79899 Other long term (current) drug therapy: Secondary | ICD-10-CM | POA: Insufficient documentation

## 2023-05-07 DIAGNOSIS — E11649 Type 2 diabetes mellitus with hypoglycemia without coma: Principal | ICD-10-CM | POA: Diagnosis present

## 2023-05-07 DIAGNOSIS — E1110 Type 2 diabetes mellitus with ketoacidosis without coma: Secondary | ICD-10-CM | POA: Insufficient documentation

## 2023-05-07 DIAGNOSIS — I152 Hypertension secondary to endocrine disorders: Secondary | ICD-10-CM | POA: Diagnosis present

## 2023-05-07 DIAGNOSIS — E162 Hypoglycemia, unspecified: Principal | ICD-10-CM

## 2023-05-07 DIAGNOSIS — R7989 Other specified abnormal findings of blood chemistry: Secondary | ICD-10-CM | POA: Diagnosis present

## 2023-05-07 DIAGNOSIS — E1159 Type 2 diabetes mellitus with other circulatory complications: Secondary | ICD-10-CM | POA: Diagnosis present

## 2023-05-07 DIAGNOSIS — E871 Hypo-osmolality and hyponatremia: Secondary | ICD-10-CM | POA: Insufficient documentation

## 2023-05-07 DIAGNOSIS — I10 Essential (primary) hypertension: Secondary | ICD-10-CM | POA: Insufficient documentation

## 2023-05-07 LAB — CBG MONITORING, ED
Glucose-Capillary: 103 mg/dL — ABNORMAL HIGH (ref 70–99)
Glucose-Capillary: 159 mg/dL — ABNORMAL HIGH (ref 70–99)
Glucose-Capillary: 49 mg/dL — ABNORMAL LOW (ref 70–99)
Glucose-Capillary: 46 mg/dL — ABNORMAL LOW (ref 70–99)
Glucose-Capillary: 175 mg/dL — ABNORMAL HIGH (ref 70–99)
Glucose-Capillary: 107 mg/dL — ABNORMAL HIGH (ref 70–99)

## 2023-05-07 LAB — COMPREHENSIVE METABOLIC PANEL
ALT: 15 U/L (ref 0–44)
AST: 21 U/L (ref 15–41)
Albumin: 4.6 g/dL (ref 3.5–5.0)
Alkaline Phosphatase: 89 U/L (ref 38–126)
Anion gap: 14 (ref 5–15)
BUN: 16 mg/dL (ref 6–20)
CO2: 27 mmol/L (ref 22–32)
Calcium: 10.2 mg/dL (ref 8.9–10.3)
Chloride: 97 mmol/L — ABNORMAL LOW (ref 98–111)
Creatinine, Ser: 1.14 mg/dL (ref 0.61–1.24)
GFR, Estimated: >60 mL/min (ref >60)
Glucose, Bld: 60 mg/dL — ABNORMAL LOW (ref 70–99)
Potassium: 3.5 mmol/L (ref 3.5–5.1)
Sodium: 138 mmol/L (ref 135–145)
Total Bilirubin: 0.7 mg/dL (ref 0.3–1.2)
Total Protein: 8.8 g/dL — ABNORMAL HIGH (ref 6.5–8.1)

## 2023-05-07 LAB — CBC
HCT: 50.1 % (ref 39.0–52.0)
Hemoglobin: 17.7 g/dL — ABNORMAL HIGH (ref 13.0–17.0)
MCH: 28.8 pg (ref 26.0–34.0)
MCHC: 35.3 g/dL (ref 30.0–36.0)
MCV: 81.6 fL (ref 80.0–100.0)
Platelets: 270 10*3/uL (ref 150–400)
RBC: 6.14 MIL/uL — ABNORMAL HIGH (ref 4.22–5.81)
RDW: 12.9 % (ref 11.5–15.5)
WBC: 9.5 10*3/uL (ref 4.0–10.5)
nRBC: 0 % (ref 0.0–0.2)

## 2023-05-07 LAB — GLUCOSE, CAPILLARY: Glucose-Capillary: 348 mg/dL — ABNORMAL HIGH (ref 70–99)

## 2023-05-07 LAB — ETHANOL: Alcohol, Ethyl (B): <10 mg/dL (ref <10)

## 2023-05-07 MED ORDER — LISINOPRIL 10 MG PO TABS: 10.0000 mg | ORAL_TABLET | Freq: Every day | ORAL | Status: DC

## 2023-05-07 MED ORDER — ALBUTEROL SULFATE (2.5 MG/3ML) 0.083% IN NEBU: 2.5000 mg | INHALATION_SOLUTION | RESPIRATORY_TRACT | Status: DC | PRN

## 2023-05-07 MED ORDER — TRAZODONE HCL 50 MG PO TABS: 25.0000 mg | ORAL_TABLET | Freq: Every evening | ORAL | Status: DC | PRN

## 2023-05-07 MED ORDER — DEXTROSE-SODIUM CHLORIDE 10-0.45 % IV SOLN
INTRAVENOUS | Status: DC
  Filled 2023-05-07 (×3): qty 1000

## 2023-05-07 MED ORDER — ACETAMINOPHEN 650 MG RE SUPP: 650.0000 mg | Freq: Four times a day (QID) | RECTAL | Status: DC | PRN

## 2023-05-07 MED ORDER — DEXTROSE 50 % IV SOLN
1.0000 | Freq: Once | INTRAVENOUS | Status: AC
  Administered 2023-05-07: 50 mL via INTRAVENOUS
  Filled 2023-05-07: qty 50

## 2023-05-07 MED ORDER — LORAZEPAM 1 MG PO TABS: 1.0000 mg | ORAL_TABLET | ORAL | Status: DC | PRN

## 2023-05-07 MED ORDER — ACETAMINOPHEN 325 MG PO TABS: 650.0000 mg | ORAL_TABLET | Freq: Four times a day (QID) | ORAL | Status: DC | PRN

## 2023-05-07 MED ORDER — ONDANSETRON HCL 4 MG/2ML IJ SOLN: 4.0000 mg | Freq: Four times a day (QID) | INTRAMUSCULAR | Status: DC | PRN

## 2023-05-07 MED ORDER — ENOXAPARIN SODIUM 40 MG/0.4ML IJ SOSY
40.0000 mg | PREFILLED_SYRINGE | INTRAMUSCULAR | Status: DC
  Administered 2023-05-07 – 2023-05-08 (×2): 40 mg via SUBCUTANEOUS
  Filled 2023-05-07 (×2): qty 0.4

## 2023-05-07 MED ORDER — NALOXONE HCL 0.4 MG/ML IJ SOLN: 0.4000 mg | Freq: Once | INTRAMUSCULAR | Status: DC

## 2023-05-07 MED ORDER — INSULIN ASPART 100 UNIT/ML IJ SOLN
0.0000 [IU] | Freq: Three times a day (TID) | INTRAMUSCULAR | Status: DC
  Administered 2023-05-07: 11 [IU] via SUBCUTANEOUS
  Filled 2023-05-07: qty 0.15

## 2023-05-07 MED ORDER — HYDRALAZINE HCL 20 MG/ML IJ SOLN
5.0000 mg | Freq: Four times a day (QID) | INTRAMUSCULAR | Status: DC | PRN
  Administered 2023-05-08: 5 mg via INTRAVENOUS
  Filled 2023-05-07: qty 1

## 2023-05-07 MED ORDER — ONDANSETRON HCL 4 MG PO TABS: 4.0000 mg | ORAL_TABLET | Freq: Four times a day (QID) | ORAL | Status: DC | PRN

## 2023-05-07 NOTE — ED Notes (Signed)
ED TO INPATIENT HANDOFF REPORT  Name/Age/Gender [name] [name] 38 y.o. male  Code Status    Code Status Orders  (From admission, onward)           Start     Ordered   05/07/23 1248  Full code  Continuous       Question:  By:  Answer:  Consent: discussion documented in EHR   05/07/23 1247           Code Status History     Date Active Date Inactive Code Status Order ID Comments User Context   04/20/2023 0340 04/21/2023 2237 Full Code [id number]  Angie Fava, DO ED   01/01/2023 2017 01/02/2023 2046 Full Code [id number]  Therisa Doyne, MD ED   08/12/2022 0839 08/14/2022 2117 Full Code [id number]  Belva Agee, MD ED   05/16/2022 2321 05/21/2022 2229 Full Code [id number]  Synetta Fail, MD ED   05/08/2022 1446 05/10/2022 1714 Full Code [id number]  Teddy Spike, DO ED   04/06/2022 0032 04/07/2022 1642 Full Code [id number]  Cherly Anderson, PA-C ED   04/05/2022 1744 04/06/2022 0020 Full Code [id number]  Lenard Lance, FNP ED   07/03/2019 0004 07/06/2019 1553 Full Code [id number]  Eduard Clos, MD ED       Home/SNF/Other Home  Chief Complaint Hypoglycemia associated with type 2 diabetes mellitus (HCC) [E11.649]  Level of Care/Admitting Diagnosis ED Disposition     ED Disposition  Admit   Condition  --   Comment  Hospital Area: [facility] [[id number]]  Level of Care: Progressive [102]  Admit to Progressive based on following criteria: GI, ENDOCRINE disease patients with GI bleeding, acute liver failure [name] pancreatitis, stable with diabetic ketoacidosis [name] thyrotoxicosis (hypothyroid) state.  May place patient in observation at [name] if equivalent level of care is available:: Yes  Covid Evaluation: Asymptomatic - no recent exposure (last 10 days) testing not required  Diagnosis: Hypoglycemia associated with type 2 diabetes mellitus [facility]) [[id number]]  Admitting Physician: Maryln Gottron [[id number]]  Attending  Physician: Kirby Crigler, MIR [place] [[id number]]          Medical History Past Medical History:  Diagnosis Date   Diabetes mellitus without complication (HCC)    DKA (diabetic ketoacidosis) (HCC) 05/08/2022   HTN (hypertension) 05/08/2022   Hyperlipidemia    Hyperosmolar hyperglycemic state (HHS) (HCC) 05/08/2022   Hypertension    Hypoglycemia 07/02/2019   Pseudohyponatremia 05/08/2022    Allergies No Known Allergies  IV Location/Drains/Wounds Patient Lines/Drains/Airways Status     Active Line/Drains/Airways     Name Placement date Placement time Site Days   Peripheral IV 05/07/23 20 G 1" Left;Medial Antecubital 05/07/23  0752  Antecubital  less than 1            Labs/Imaging Results for orders placed [name] performed during the hospital encounter of 05/07/23 (from the past 48 hour(s))  CBG monitoring, ED     Status: Abnormal   Collection Time: 05/07/23  5:58 AM  Result Value Ref Range   Glucose-Capillary 103 (H) 70 - 99 mg/dL    Comment: Glucose reference range applies only to samples taken after fasting for at least 8 hours.   Comment 1 Notify RN   CBC     Status: Abnormal   Collection Time: 05/07/23  6:23 AM  Result Value Ref Range   WBC 9.5 4.0 - 10.5 K/uL   RBC 6.14 (H) 4.22 - 5.81 MIL/uL  Hemoglobin 17.7 (H) 13.0 - 17.0 g/dL   HCT 30.8 65.7 - 84.6 %   MCV 81.6 80.0 - 100.0 fL   MCH 28.8 26.0 - 34.0 pg   MCHC 35.3 30.0 - 36.0 g/dL   RDW 96.2 95.2 - 84.1 %   Platelets 270 150 - 400 K/uL   nRBC 0.0 0.0 - 0.2 %    Comment: Performed at [facility], [address], [place], [place] [address]  Ethanol     Status: None   Collection Time: 05/07/23  6:23 AM  Result Value Ref Range   Alcohol, Ethyl (B) <10 <10 mg/dL    Comment: (NOTE) Lowest detectable limit for serum alcohol is 10 mg/dL.  For medical purposes only. Performed at [facility], [address] [place], [place] [address]   Comprehensive metabolic panel      Status: Abnormal   Collection Time: 05/07/23  7:54 AM  Result Value Ref Range   Sodium 138 135 - 145 mmol/L   Potassium 3.5 3.5 - 5.1 mmol/L   Chloride 97 (L) 98 - 111 mmol/L   CO2 27 22 - 32 mmol/L   Glucose, Bld 60 (L) 70 - 99 mg/dL    Comment: Glucose reference range applies only to samples taken after fasting for at least 8 hours.   BUN 16 6 - 20 mg/dL   Creatinine, Ser 5.36 0.61 - 1.24 mg/dL   Calcium 64.4 8.9 - 03.4 mg/dL   Total Protein 8.8 (H) 6.5 - 8.1 g/dL   Albumin 4.6 3.5 - 5.0 g/dL   AST 21 15 - 41 U/L   ALT 15 0 - 44 U/L   Alkaline Phosphatase 89 38 - 126 U/L   Total Bilirubin 0.7 0.3 - 1.2 mg/dL   GFR, Estimated >74 >25 mL/min    Comment: (NOTE) Calculated using the CKD-EPI Creatinine Equation (2021)    Anion gap 14 5 - 15    Comment: Performed at [facility], [address] [place], [place] [address]  POC CBG, ED     Status: Abnormal   Collection Time: 05/07/23  9:15 AM  Result Value Ref Range   Glucose-Capillary 159 (H) 70 - 99 mg/dL    Comment: Glucose reference range applies only to samples taken after fasting for at least 8 hours.  CBG monitoring, ED     Status: Abnormal   Collection Time: 05/07/23 11:21 AM  Result Value Ref Range   Glucose-Capillary 49 (L) 70 - 99 mg/dL    Comment: Glucose reference range applies only to samples taken after fasting for at least 8 hours.   Comment 1 Repeat Test   CBG monitoring, ED     Status: Abnormal   Collection Time: 05/07/23 11:30 AM  Result Value Ref Range   Glucose-Capillary 46 (L) 70 - 99 mg/dL    Comment: Glucose reference range applies only to samples taken after fasting for at least 8 hours.  CBG monitoring, ED     Status: Abnormal   Collection Time: 05/07/23 12:39 PM  Result Value Ref Range   Glucose-Capillary 175 (H) 70 - 99 mg/dL    Comment: Glucose reference range applies only to samples taken after fasting for at least 8 hours.   CT Head Wo Contrast  Result Date:  05/07/2023 CLINICAL DATA:  37 year old male with weakness and dizziness, altered mental status. EXAM: CT HEAD WITHOUT CONTRAST TECHNIQUE: Contiguous axial images were obtained from the base of the skull through the  vertex without intravenous contrast. RADIATION DOSE REDUCTION: This exam was performed according to the departmental dose-optimization program which includes automated exposure control, adjustment of the mA and/[name] kV according to patient size and/[name] use of iterative reconstruction technique. COMPARISON:  Head CT 04/30/2023 and earlier. FINDINGS: Brain: Cavum septum pellucidum, normal variant. Cerebral volume is stable and within normal limits. No midline shift, ventriculomegaly, mass effect, evidence of mass lesion, intracranial hemorrhage [name] evidence of cortically based acute infarction. Gray-white matter differentiation is within normal limits throughout the brain. Vascular: No suspicious intracranial vascular hyperdensity. Skull: Intact, negative. Sinuses/Orbits: Visualized paranasal sinuses and mastoids are clear. Tympanic cavities appear clear. Other: Disconjugate gaze but similar to prior. Stable scalp soft tissues. IMPRESSION: Stable and negative noncontrast CT appearance of the brain. Electronically Signed   By: Odessa Fleming M.D.   On: 05/07/2023 07:12   DG Chest Port 1 View  Result Date: 05/07/2023 CLINICAL DATA:  37 year old male with weakness and dizziness. Altered mental status. EXAM: PORTABLE CHEST 1 VIEW COMPARISON:  Portable chest 04/12/2023 and earlier. FINDINGS: Portable AP semi upright view at 0621 hours. Mildly larger, improved lung volumes. Mildly rotated to the left today. Normal cardiac size and mediastinal contours. Visualized tracheal air column is within normal limits. Allowing for portable technique the lungs are clear. No pneumothorax [name] pleural effusion. No osseous abnormality identified. Negative visible bowel gas. IMPRESSION: Negative portable chest. Electronically Signed    By: Odessa Fleming M.D.   On: 05/07/2023 06:33    Pending Labs Unresulted Labs (From admission, onward)     Start     Ordered   05/08/23 0500  Basic metabolic panel  Tomorrow morning,   R        05/07/23 1247   05/08/23 0500  CBC  Tomorrow morning,   R        05/07/23 1247   05/07/23 0602  Urine rapid drug screen (hosp performed)  Once,   STAT        05/07/23 0601   05/07/23 0550  Urinalysis, Routine w reflex microscopic -Urine, Clean Catch  Once,   URGENT       Question:  Specimen Source  Answer:  Urine, Clean Catch   05/07/23 0550            Vitals/Pain Today's Vitals   05/07/23 1030 05/07/23 1045 05/07/23 1100 05/07/23 1115  BP: (!) 175/113 (!) 179/107 (!) 160/115 (!) 174/107  Pulse:      Resp: 20 (!) 21 18 19   Temp:      SpO2:      Weight:      Height:      PainSc:        Isolation Precautions No active isolations  Medications Medications  dextrose 10 % and 0.45 % NaCl infusion ( Intravenous New Bag/Given 05/07/23 1211)  lisinopril (ZESTRIL) tablet 10 mg (has no administration in time range)  enoxaparin (LOVENOX) injection 40 mg (has no administration in time range)  insulin aspart (novoLOG) injection 0-15 Units (has no administration in time range)  acetaminophen (TYLENOL) tablet 650 mg (has no administration in time range)    [name]  acetaminophen (TYLENOL) suppository 650 mg (has no administration in time range)  traZODone (DESYREL) tablet 25 mg (has no administration in time range)  ondansetron (ZOFRAN) tablet 4 mg (has no administration in time range)    [name]  ondansetron (ZOFRAN) injection 4 mg (has no administration in time range)  albuterol (PROVENTIL) (2.5 MG/3ML) 0.083% nebulizer solution 2.5 mg (has  no administration in time range)  hydrALAZINE (APRESOLINE) injection 5 mg (has no administration in time range)  naloxone [facility]) injection 0.4 mg (has no administration in time range)  LORazepam (ATIVAN) tablet 1 mg (has no administration in time range)  dextrose  50 % solution 50 mL (50 mLs Intravenous Given 05/07/23 0902)  dextrose 50 % solution 50 mL (50 mLs Intravenous Given 05/07/23 1202)    Mobility walks

## 2023-05-07 NOTE — ED Triage Notes (Signed)
Picked up from the [facility] [address], asked the front desk clerk to call 911, thought cbg may be low (132). Weakness and dizziness for the past two hours. Low oral intake. 162/112, hr 97, 102 hr.

## 2023-05-07 NOTE — ED Notes (Signed)
Just got back from CT Scan.

## 2023-05-07 NOTE — ED Notes (Signed)
Pt sitting up in bed eating a sandwich and drinking diet coke

## 2023-05-07 NOTE — ED Triage Notes (Signed)
Pt reporting he thought his sugar may be low and he felt dizzy that is why he called ems. He reports last time he took his insulin was last night. Reports last drug use was about a week.

## 2023-05-07 NOTE — H&P (Signed)
History and Physical  IBHAN [name] [name]:[id number] DOB: [birth year] DOA: 05/07/2023  PCP: [name], NP   Chief Complaint: low blood sugar   HPI: [name] [name] is a 37 y.o. male with medical history significant for insulin-dependent type 2 diabetes, polysubstance abuse, homelessness, medication noncompliance being admitted to the hospital with somnolence and hypoglycemia.  Patient has had multiple ER visits for hyperglycemia and hypoglycemia.  Today he was staying in a [employer], went to the front desk and asked him to call 911 because he felt that his blood sugar was low.  Per EMS, blood sugar was 132, patient apparently told them that he has not been eating very much, and has been feeling weak and dizzy for the last couple of hours.  Blood pressure with EMS 162/112, heart rate 97.  In the emergency department, he told triage nurse that he took insulin last night.  States that he last did drugs about 1 week ago, during prior ER visit on 9/22, he was positive for amphetamines.  Here in the emergency department he has been quite somnolent, unable to take anything by mouth.  He has been afebrile with stable vital signs blood pressure elevated saturating well on room air.  Lab work including CBC and CMP significant for initial blood glucose 60, increased to 159 after having a snack, but dropped again to 46.  He has now been placed on D10 infusion and blood sugar is improving.  However he still remains somnolent.  Unable to give me any further pertinent history, arousable with stimulation but falls back asleep.  Review of Systems: Please see HPI for pertinent positives and negatives.  Complete 10 system review of systems could not be completed.  Due to the patient's somnolence.  Past Medical History:  Diagnosis Date   Diabetes mellitus without complication (HCC)    DKA (diabetic ketoacidosis) (HCC) 05/08/2022   HTN (hypertension) 05/08/2022   Hyperlipidemia    Hyperosmolar hyperglycemic state  (HHS) (HCC) 05/08/2022   Hypertension    Hypoglycemia 07/02/2019   Pseudohyponatremia 05/08/2022   Past Surgical History:  Procedure Laterality Date   HAND SURGERY      Social History:  reports that he has never smoked. He has never used smokeless tobacco. He reports current drug use. Drug: Amphetamines. He reports that he does not drink alcohol.   No Known Allergies  Family History  Family history unknown: Yes     Prior to Admission medications   Medication Sig Start Date End Date Taking? Authorizing Provider  glucose blood (ONETOUCH ULTRA TEST) test strip Use 1 strip to check blood sugar in the morning, at noon, in the evening, and at bedtime. 04/21/23   Hongalgi, Maximino Greenland, MD  insulin glargine (LANTUS) 100 UNIT/ML Solostar Pen Inject 25 Units into the skin at bedtime. 04/21/23   Hongalgi, Maximino Greenland, MD  insulin lispro (HUMALOG) 100 UNIT/ML KwikPen Inject 20 Units into the skin 3 (three) times daily before meals. 04/21/23   Hongalgi, Maximino Greenland, MD  Insulin Pen Needle (PEN NEEDLES 31GX5/16") 31G X 8 MM MISC Use to inject insulin in the morning, at noon, in the evening, and at bedtime. 04/21/23   Hongalgi, Maximino Greenland, MD  Lancets MISC Use 1 each in the morning, at noon, in the evening, and at bedtime. 04/21/23   Hongalgi, Maximino Greenland, MD  lisinopril (ZESTRIL) 10 MG tablet Take 1 tablet (10 mg total) by mouth daily. 04/21/23 05/21/23  Elease Etienne, MD    Physical Exam:  BP (!) 174/107   Pulse 84   Temp 98.4 F (36.9 C)   Resp 19   Ht 5\' 11"  (1.803 m)   Wt 98 kg   SpO2 100%   BMI 30.13 kg/m   General: Well-developed older male appearing his stated age, sleeping in the emergency department.  Saturating well, protecting his airway.  He wakes up to verbal and tactile stimulus, answers questions, but then falls back to sleep quickly. Eyes: EOMI, clear conjuctivae, white sclerea, equal pinpoint pupils noted. Cardiovascular: RRR, no murmurs or rubs, no peripheral edema  Respiratory: clear to  auscultation bilaterally, no wheezes, no crackles  Abdomen: soft, nontender, nondistended, normal bowel tones heard  Skin: dry, no rashes  Musculoskeletal: no joint effusions, normal range of motion  Psychiatric: appropriate affect, normal speech but falling asleep quickly          Labs on Admission:  Basic Metabolic Panel: Recent Labs  Lab 05/07/23 0754  NA 138  K 3.5  CL 97*  CO2 27  GLUCOSE 60*  BUN 16  CREATININE 1.14  CALCIUM 10.2   Liver Function Tests: Recent Labs  Lab 05/07/23 0754  AST 21  ALT 15  ALKPHOS 89  BILITOT 0.7  PROT 8.8*  ALBUMIN 4.6   No results for input(s): "LIPASE", "AMYLASE" in the last 168 hours. No results for input(s): "AMMONIA" in the last 168 hours. CBC: Recent Labs  Lab 05/07/23 0623  WBC 9.5  HGB 17.7*  HCT 50.1  MCV 81.6  PLT 270   Cardiac Enzymes: No results for input(s): "CKTOTAL", "CKMB", "CKMBINDEX", "TROPONINI" in the last 168 hours.  BNP (last 3 results) No results for input(s): "BNP" in the last 8760 hours.  ProBNP (last 3 results) No results for input(s): "PROBNP" in the last 8760 hours.  CBG: Recent Labs  Lab 05/07/23 0558 05/07/23 0915 05/07/23 1121 05/07/23 1130 05/07/23 1239  GLUCAP 103* 159* 49* 46* 175*    Radiological Exams on Admission: CT Head Wo Contrast  Result Date: 05/07/2023 CLINICAL DATA:  37 year old male with weakness and dizziness, altered mental status. EXAM: CT HEAD WITHOUT CONTRAST TECHNIQUE: Contiguous axial images were obtained from the base of the skull through the vertex without intravenous contrast. RADIATION DOSE REDUCTION: This exam was performed according to the departmental dose-optimization program which includes automated exposure control, adjustment of the mA and/or kV according to patient size and/or use of iterative reconstruction technique. COMPARISON:  Head CT 04/30/2023 and earlier. FINDINGS: Brain: Cavum septum pellucidum, normal variant. Cerebral volume is stable and  within normal limits. No midline shift, ventriculomegaly, mass effect, evidence of mass lesion, intracranial hemorrhage or evidence of cortically based acute infarction. Gray-white matter differentiation is within normal limits throughout the brain. Vascular: No suspicious intracranial vascular hyperdensity. Skull: Intact, negative. Sinuses/Orbits: Visualized paranasal sinuses and mastoids are clear. Tympanic cavities appear clear. Other: Disconjugate gaze but similar to prior. Stable scalp soft tissues. IMPRESSION: Stable and negative noncontrast CT appearance of the brain. Electronically Signed   By: Odessa Fleming M.D.   On: 05/07/2023 07:12   DG Chest Port 1 View  Result Date: 05/07/2023 CLINICAL DATA:  37 year old male with weakness and dizziness. Altered mental status. EXAM: PORTABLE CHEST 1 VIEW COMPARISON:  Portable chest 04/12/2023 and earlier. FINDINGS: Portable AP semi upright view at 0621 hours. Mildly larger, improved lung volumes. Mildly rotated to the left today. Normal cardiac size and mediastinal contours. Visualized tracheal air column is within normal limits. Allowing for portable technique the lungs are clear.  No pneumothorax or pleural effusion. No osseous abnormality identified. Negative visible bowel gas. IMPRESSION: Negative portable chest. Electronically Signed   By: Odessa Fleming M.D.   On: 05/07/2023 06:33    Assessment/Plan KHANI EBEL is a 37 y.o. male with medical history significant for insulin-dependent type 2 diabetes, polysubstance abuse, homelessness, medication noncompliance being admitted to the hospital with somnolence and hypoglycemia.    Somnolence and hypoglycemia-I suspect recurrent substance abuse possibly with narcotics given his pinpoint pupils and continued somnolence despite improvement in his blood sugar.  However he also does have recurrent hypoglycemia which could be due to use of Lantus and lack of oral intake.  No evidence of acute infection, CVA or other etiology.   Patient was initially very somnolent as described above, but 5 minutes after leaving the patient's room, was notified by nursing staff that the patient is now awake alert oriented and eating something. -Observation admission to progressive -Continue D10 infusion -Narcan 0.4 mg in case of recurrent somnolence -Check urine drug screen -Hold basal insulin  Type 2 diabetes-very poorly controlled, due to homelessness, drug abuse and poor compliance -Regular diet for now due to hypoglycemia -Hold home Lantus -Moderate dose sliding scale  Hypertension-she has a history of poorly controlled hypertension due to noncompliance -Continue home dose lisinopril -IV hydralazine as needed for SBP greater than 160  DVT prophylaxis: Lovenox     Code Status: Full Code  Consults called: None  Admission status: Observation  Time spent: 49 minutes  Velinda Wrobel Sharlette Dense MD [facility] Pager [phone]  If 7PM-7AM, please contact night-coverage [email or web address] Password [email or web address]  05/07/2023, 12:48 PM

## 2023-05-07 NOTE — ED Provider Notes (Signed)
[facility] DE[facility]MENT AT [facility] Provider Note   CSN: [id number] Arrival date & time: 05/07/23  0534     History  No chief complaint on file.   [name] [name] is a 37 y.o. male.  The history is provided by the EMS personnel, the patient and medical records.  [name] [name] is a 37 y.o. male who presents to the Emergency Department complaining of weakness.  He presents to the emergency department by EMS from the [facility] for complaint of weakness and he felt like his blood sugar was low.  Patient is unable to provide much history.  He does report that he uses ice sometimes, last use yesterday.  Uses them..  Denies any recent illnesses or self-harm attempt.  He is also given insulin yesterday.  He states he has not eaten since yesterday.     Home Medications Prior to Admission medications   Medication Sig Start Date End Date Taking? Authorizing Provider  blood glucose meter kit and supplies KIT Use up to four times daily as directed to test blood sugar 01/02/23   Rhetta Mura, MD  Blood Glucose Monitoring Suppl (ONE TOUCH ULTRA 2) w/Device KIT Use as directed to check blood sugar 04/21/23   Hongalgi, Maximino Greenland, MD  glucose blood (ONETOUCH ULTRA TEST) test strip Use 1 strip to check blood sugar in the morning, at noon, in the evening, and at bedtime. 04/21/23   Hongalgi, Maximino Greenland, MD  insulin glargine (LANTUS) 100 UNIT/ML Solostar Pen Inject 25 Units into the skin at bedtime. 04/21/23   Hongalgi, Maximino Greenland, MD  insulin lispro (HUMALOG) 100 UNIT/ML KwikPen Inject 20 Units into the skin 3 (three) times daily before meals. 04/21/23   Hongalgi, Maximino Greenland, MD  Insulin Pen Needle (PEN NEEDLES 31GX5/16") 31G X 8 MM MISC Use to inject insulin in the morning, at noon, in the evening, and at bedtime. 04/21/23   Hongalgi, Maximino Greenland, MD  Lancets MISC Use 1 each in the morning, at noon, in the evening, and at bedtime. 04/21/23   Hongalgi, Maximino Greenland, MD  lisinopril (ZESTRIL) 10 MG tablet  Take 1 tablet (10 mg total) by mouth daily. 04/21/23 05/21/23  Elease Etienne, MD      Allergies    Patient has no known allergies.    Review of Systems   Review of Systems  All other systems reviewed and are negative.   Physical Exam Updated Vital Signs BP (!) 162/112   Pulse 96   Temp 98.4 F (36.9 C)   Resp 16   SpO2 100%  Physical Exam Vitals and nursing note reviewed.  Constitutional:      Appearance: He is well-developed.     Comments: lethargic  HENT:     Head: Normocephalic and atraumatic.  Cardiovascular:     Rate and Rhythm: Normal rate and regular rhythm.     Heart sounds: No murmur heard. Pulmonary:     Effort: Pulmonary effort is normal. No respiratory distress.     Breath sounds: Normal breath sounds.  Abdominal:     Palpations: Abdomen is soft.     Tenderness: There is no abdominal tenderness. There is no guarding or rebound.  Musculoskeletal:        General: No tenderness.  Skin:    General: Skin is warm and dry.  Neurological:     Comments: Drowsy.  Awakens to painful stimuli.  Dysarthric speech.  Global weakness.  Pupils pinpoint and reactive.  ED Results / Procedures / Treatments   Labs (all labs ordered are listed, but only abnormal results are displayed) Labs Reviewed  CBG MONITORING, ED - Abnormal; Notable for the following components:      Result Value   Glucose-Capillary 103 (*)    All other components within normal limits  CBC  URINALYSIS, ROUTINE W REFLEX MICROSCOPIC  COMPREHENSIVE METABOLIC PANEL  ETHANOL  RAPID URINE DRUG SCREEN, HOSP PERFORMED    EKG None  Radiology No results found.  Procedures Procedures    Medications Ordered in ED Medications - No data to display  ED Course/ Medical Decision Making/ A&P                                 Medical Decision Making Amount and/or Complexity of Data Reviewed Labs: ordered. Radiology: ordered.   Patient with history of diabetes, drug abuse here for  evaluation of weakness.  Patient reported that he felt like his blood sugar was low.  He did not have hypoglycemia for EMS.  Patient lethargic and difficult to arouse on ED evaluation.  He does have pinpoint pupils but is protecting his airway.  Blood sugar is not low.  Plan to obtain CT head, labs and reevaluate.  Patient care transferred pending additional studies and reevaluation.        Final Clinical Impression(s) / ED Diagnoses Final diagnoses:  None    Rx / DC Orders ED Discharge Orders     None         Tilden Fossa, MD 05/07/23 [id number]

## 2023-05-07 NOTE — ED Provider Notes (Addendum)
  Physical Exam  BP (!) 162/112   Pulse 96   Temp 98.4 F (36.9 C)   Resp 16   Ht 5\' 11"  (1.803 m)   Wt 98 kg   SpO2 100%   BMI 30.13 kg/m   Physical Exam  Procedures  .Critical Care  Performed by: Derwood Kaplan, MD Authorized by: Derwood Kaplan, MD   Critical care provider statement:    Critical care time (minutes):  45   Critical care was time spent personally by me on the following activities:  Development of treatment plan with patient or surrogate, discussions with consultants, evaluation of patient's response to treatment, examination of patient, ordering and review of laboratory studies, ordering and review of radiographic studies, ordering and performing treatments and interventions, pulse oximetry, re-evaluation of patient's condition and review of old charts   ED Course / MDM    Medical Decision Making Amount and/or Complexity of Data Reviewed Labs: ordered. Radiology: ordered.  Risk Prescription drug management.   Pt comes in with cc of low sugar, weakness. But his blood sugar is normal. Patient has polysubstance use. VSS and WNL. Labs pending.  We will reassess.   Derwood Kaplan, MD 05/07/23 0719  12:08 PM Patient's blood sugar was in the 60s on metabolic profile.  We give him IV D50.  We then checked the blood sugar again, now it is in the 40s.  We will give him another amp of D50 and admit him to the hospital.  Patient is still not able to provide any meaningful history.  He is answering questions, but not able to cooperate fully.  Unable to get him to keep food down.   Derwood Kaplan, MD 05/07/23 1210

## 2023-05-07 NOTE — Inpatient Diabetes Management (Signed)
Inpatient Diabetes Program Recommendations  AACE/ADA: New Consensus Statement on Inpatient Glycemic Control (2015)  Target Ranges:  Prepandial:   less than 140 mg/dL      Peak postprandial:   less than 180 mg/dL (1-2 hours)      Critically ill patients:  140 - 180 mg/dL   Lab Results  Component Value Date   GLUCAP 107 (H) 05/07/2023   HGBA1C 12.4 (H) 04/20/2023    Latest Reference Range & Units 05/07/23 05:58 05/07/23 09:15 05/07/23 11:21 05/07/23 11:30 05/07/23 12:39 05/07/23 13:38  Glucose-Capillary 70 - 99 mg/dL 130 (H) 865 (H) 49 (L) 46 (L) 175 (H) 107 (H)  (H): Data is abnormally high (L): Data is abnormally low  Review of Glycemic Control  Diabetes history: DM Outpatient Diabetes medications: Lantus 30 at bedtime, Humalog 20 TID Current orders for Inpatient glycemic control: Novolog 0-15 units tid correction  Inpatient Diabetes Program Recommendations:   DM coordinator spoke with patient on prior admission 04/21/23 and reviewed A1c with review of risks of elevated CBGs. On last admission patient stated he was taking his insulin as prescribed but did not have a place to store it and it "went bad".  Please consider: -Decrease Novolog correction to 0-9 units tid, 0-5 units hs until CBGs consistant.  Will also need basal + mea coverage added when CBGs increasing.  Thank you, [name] [name]. [name] Bastin, RN, MSN, CDE  Diabetes Coordinator Inpatient Glycemic Control Team Team Pager [phone] (8am-5pm) 05/07/2023 2:35 PM

## 2023-05-08 LAB — CBC
HCT: 46 % (ref 39.0–52.0)
Hemoglobin: 15.7 g/dL (ref 13.0–17.0)
MCH: 28.2 pg (ref 26.0–34.0)
MCHC: 34.1 g/dL (ref 30.0–36.0)
MCV: 82.7 fL (ref 80.0–100.0)
Platelets: 194 10*3/uL (ref 150–400)
RBC: 5.56 MIL/uL (ref 4.22–5.81)
RDW: 12.6 % (ref 11.5–15.5)
WBC: 6.4 10*3/uL (ref 4.0–10.5)
nRBC: 0 % (ref 0.0–0.2)

## 2023-05-08 LAB — URINALYSIS, ROUTINE W REFLEX MICROSCOPIC
Bacteria, UA: NONE SEEN
Bilirubin Urine: NEGATIVE
Glucose, UA: >=500 mg/dL — AB
Hgb urine dipstick: NEGATIVE
Ketones, ur: NEGATIVE mg/dL
Leukocytes,Ua: NEGATIVE
Nitrite: NEGATIVE
Protein, ur: NEGATIVE mg/dL
Specific Gravity, Urine: 1.027 (ref 1.005–1.030)
pH: 5 (ref 5.0–8.0)

## 2023-05-08 LAB — GLUCOSE, CAPILLARY
Glucose-Capillary: 371 mg/dL — ABNORMAL HIGH (ref 70–99)
Glucose-Capillary: 395 mg/dL — ABNORMAL HIGH (ref 70–99)
Glucose-Capillary: 402 mg/dL — ABNORMAL HIGH (ref 70–99)
Glucose-Capillary: 252 mg/dL — ABNORMAL HIGH (ref 70–99)
Glucose-Capillary: 72 mg/dL (ref 70–99)
Glucose-Capillary: 140 mg/dL — ABNORMAL HIGH (ref 70–99)
Glucose-Capillary: 128 mg/dL — ABNORMAL HIGH (ref 70–99)
Glucose-Capillary: 302 mg/dL — ABNORMAL HIGH (ref 70–99)

## 2023-05-08 LAB — BASIC METABOLIC PANEL
Anion gap: 10 (ref 5–15)
BUN: 21 mg/dL — ABNORMAL HIGH (ref 6–20)
CO2: 21 mmol/L — ABNORMAL LOW (ref 22–32)
Calcium: 8.5 mg/dL — ABNORMAL LOW (ref 8.9–10.3)
Chloride: 97 mmol/L — ABNORMAL LOW (ref 98–111)
Creatinine, Ser: 0.98 mg/dL (ref 0.61–1.24)
GFR, Estimated: >60 mL/min (ref >60)
Glucose, Bld: 337 mg/dL — ABNORMAL HIGH (ref 70–99)
Potassium: 3.8 mmol/L (ref 3.5–5.1)
Sodium: 128 mmol/L — ABNORMAL LOW (ref 135–145)

## 2023-05-08 LAB — RAPID URINE DRUG SCREEN, HOSP PERFORMED
Amphetamines: POSITIVE — AB
Barbiturates: NOT DETECTED
Benzodiazepines: NOT DETECTED
Cocaine: NOT DETECTED
Opiates: NOT DETECTED
Tetrahydrocannabinol: NOT DETECTED

## 2023-05-08 MED ORDER — INSULIN ASPART 100 UNIT/ML IJ SOLN
0.0000 [IU] | Freq: Three times a day (TID) | INTRAMUSCULAR | Status: DC
  Administered 2023-05-08: 7 [IU] via SUBCUTANEOUS

## 2023-05-08 MED ORDER — INSULIN GLARGINE-YFGN 100 UNIT/ML ~~LOC~~ SOLN
10.0000 [IU] | Freq: Every day | SUBCUTANEOUS | Status: DC
  Administered 2023-05-08: 10 [IU] via SUBCUTANEOUS
  Filled 2023-05-08: qty 0.1

## 2023-05-08 MED ORDER — INSULIN ASPART 100 UNIT/ML IJ SOLN: 10.0000 [IU] | Freq: Once | INTRAMUSCULAR | Status: DC

## 2023-05-08 MED ORDER — STERILE WATER FOR INJECTION IJ SOLN
INTRAMUSCULAR | Status: AC
  Filled 2023-05-08: qty 10

## 2023-05-08 MED ORDER — INSULIN ASPART 100 UNIT/ML IJ SOLN
15.0000 [IU] | Freq: Once | INTRAMUSCULAR | Status: AC
  Administered 2023-05-08: 15 [IU] via SUBCUTANEOUS

## 2023-05-08 MED ORDER — INSULIN LISPRO (1 UNIT DIAL) 100 UNIT/ML (KWIKPEN): 8.0000 [IU] | PEN_INJECTOR | Freq: Three times a day (TID) | SUBCUTANEOUS | Status: DC

## 2023-05-08 MED ORDER — INSULIN ASPART 100 UNIT/ML IJ SOLN
0.0000 [IU] | INTRAMUSCULAR | Status: DC
  Administered 2023-05-08: 8 [IU] via SUBCUTANEOUS

## 2023-05-08 NOTE — Hospital Course (Signed)
[name] [name] is a 37 y.o. male with a history of diabetes mellitus, polysubstance abuse, homelessness, medication nonadherence.  Patient presented secondary to hypoglycemia with associated somnolence. Patient recovered with D10 infusion and remained stable on oral intake.
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
## 2023-05-08 NOTE — Discharge Instructions (Signed)
Barbette Or,  You were in the hospital because of low blood sugar. Your insulin has been adjusted. Please ensure you are taking your medication. Please follow-up with your PCP.

## 2023-05-08 NOTE — TOC Initial Note (Signed)
Transition of Care [facility]) - Initial/Assessment Note    Patient Details  Name: [name] [name] MRN: [id number] Date of Birth: [birth year]  [facility] CM/SW Contact:    Larrie Kass, LCSW Phone Number: 05/08/2023, 11:46 AM  Clinical Narrative:                 CSW spoke with pt regarding consult for homelessness concerns. Pt is aware of the IRC. Pt was provided shelter resources several times and is aware of the waiting list for shelters. CSW informed pt he will have to follow up with the shelters on his own to check bed availability. Pt is requesting transportation to [facility], he reports having a bed a couple of weeks ago at a substance-use residential facility. Pt is unable to provide information to verify this. CSW explained pt can receive local transportation via bus pass. Pt reported he was given insurance a couple of weeks ago. Pt did not know what insurance he was given. Per pt's chart pt is uninsured. Pt was agreeable to receiving substance use, shelter, and food insecurity resources. Pt can receive a bus pass upon d/c.  Expected Discharge Plan: Homeless Shelter Barriers to Discharge: Continued Medical Work up   Patient Goals and CMS Choice Patient states their goals for this hospitalization and ongoing recovery are:: find a new homeless shelter          Expected Discharge Plan and Services       Living arrangements for the past 2 months: Homeless Shelter                                      Prior Living Arrangements/Services Living arrangements for the past 2 months: Homeless Shelter Lives with:: Self Patient language and need for interpreter reviewed:: Yes        Need for Family Participation in Patient Care: No (Comment) Care giver support system in place?: No (comment)   Criminal Activity/Legal Involvement Pertinent to Current Situation/Hospitalization: No - Comment as needed  Activities of Daily Living   ADL Screening (condition at  time of admission) Independently performs ADLs?: Yes (appropriate for developmental age) Is the patient deaf or have difficulty hearing?: No Does the patient have difficulty seeing, even when wearing glasses/contacts?: No Does the patient have difficulty concentrating, remembering, or making decisions?: No  Permission Sought/Granted                  Emotional Assessment Appearance:: Appears older than stated age Attitude/Demeanor/Rapport: Inconsistent, Engaged Affect (typically observed): Accepting Orientation: : Oriented to Self, Oriented to Place, Oriented to  Time, Oriented to Situation Alcohol / Substance Use: Illicit Drugs Psych Involvement: No (comment)  Admission diagnosis:  Hypoglycemia [E16.2] Hypoglycemia associated with type 2 diabetes mellitus (HCC) [E11.649] Patient Active Problem List   Diagnosis Date Noted   Hypoglycemia associated with type 2 diabetes mellitus (HCC) 05/07/2023   Hyperglycemia 04/20/2023   Acute encephalopathy 04/20/2023   Uncontrolled type 1 diabetes mellitus with hyperglycemia, with long-term current use of insulin (HCC) 01/01/2023   Abdominal pain 01/01/2023   Tobacco abuse 01/01/2023   MSSA bacteremia 05/19/2022   Hyperosmolar hyperglycemic state (HHS) (HCC) 05/16/2022   HTN (hypertension) 05/08/2022   AKI (acute kidney injury) (HCC) 05/08/2022   DM2 (diabetes mellitus, type 2) (HCC) 05/08/2022   Substance induced mood disorder (HCC) 04/06/2022   PCP:  [name], NP Pharmacy:   [facility] [address] [place] [address] Phone: [phone] Fax: [phone]  [facility] [address] [place] [address] Phone: [phone] Fax: [phone]  [facility] [address] [place] [address] Phone: [phone] Fax: [phone]  [facility] [address] [place] - [address] [place] [address] Phone: [phone] Fax: [phone]     Social Determinants of Health (SDOH) Social History: SDOH Screenings   Food Insecurity: Food Insecurity Present (05/07/2023)  Housing: Medium Risk (05/07/2023)  Transportation Needs: Unmet Transportation Needs (05/07/2023)  Utilities: At Risk (05/07/2023)  Tobacco Use: Low Risk  (05/07/2023)   SDOH Interventions: Food Insecurity Interventions: Inpatient TOC, Other (Comment) (resocures has been provided) Transportation Interventions: [employer] Given, Inpatient TOC Utilities Interventions: Intervention Not Indicated, Inpatient TOC (the pt is homeless from the [facility])   Readmission Risk Interventions     No data to display

## 2023-05-08 NOTE — Progress Notes (Signed)
Pt given and explained discharge instructions. IV removed. Tele removed. Pt explained list of resources on AVS. Pt returned his lighter and 4 credit cards, along with his clothing and shoes. Pt given bus pass and taken downstairs for discharge.

## 2023-05-08 NOTE — Discharge Summary (Signed)
Physician Discharge Summary   Patient: [name] [name] MRN: [id number] DOB: November 03, 1985  Admit date:     05/07/2023  Discharge date: 05/08/23  Discharge Physician: Jacquelin Hawking, MD   PCP: [name], NP   Recommendations at discharge:  PCP follow-up  Discharge Diagnoses: Principal Problem:   Hypoglycemia associated with type 2 diabetes mellitus (HCC) Active Problems:   HTN (hypertension)   Pseudohyponatremia  Resolved Problems:   * No resolved hospital problems. *  Hospital Course: [name] [name] is a 37 y.o. male with a history of diabetes mellitus, polysubstance abuse, homelessness, medication nonadherence.  Patient presented secondary to hypoglycemia with associated somnolence. Patient recovered with D10 infusion and remained stable on oral intake.  Assessment and Plan:  Hypoglycemia Patient's blood sugar is labile secondary to poorly controled diabetes. Patient managed with D10 IV fluid infusion which was discontinued with continued stable blood sugar.  Diabetes mellitus type 2 Uncontrolled with hyperglycemia and hypoglycemia. Resume home regimen on discharge.  Primary hypertension Continue home regimen.  Hyponatremia Pseudohyponatremia When corrected for hyperglycemia, sodium on discharge of 132-134  Polysubstance use UDS positive for amphetamines.   Consultants: None Procedures performed: None  Disposition: Home Diet recommendation: Carb modified diet   DISCHARGE MEDICATION: Allergies as of 05/08/2023   No Known Allergies      Medication List     TAKE these medications    insulin lispro 100 UNIT/ML KwikPen Commonly known as: HUMALOG Inject 8 Units into the skin 3 (three) times daily before meals. What changed: how much to take   Lantus SoloStar 100 UNIT/ML Solostar Pen Generic drug: insulin glargine Inject 25 Units into the skin at bedtime.   lisinopril 10 MG tablet Commonly known as: ZESTRIL Take 1 tablet (10 mg total) by mouth  daily.   OneTouch Delica Plus Lancet33G Misc Use 1 each in the morning, at noon, in the evening, and at bedtime.   OneTouch Ultra Test test strip Generic drug: glucose blood Use 1 strip to check blood sugar in the morning, at noon, in the evening, and at bedtime.   TechLite Pen Needles 31G X 8 MM Misc Generic drug: Insulin Pen Needle Use to inject insulin in the morning, at noon, in the evening, and at bedtime.        Follow-up Information     Placey, [name], NP. Schedule an appointment as soon as possible for a visit in 1 week(s).   Why: For hospital follow-up Contact information: [address] [place] [address] [phone]                Discharge Exam: BP (!) 159/108 (BP Location: Left Arm)   Pulse 100   Temp 98.9 F (37.2 C) (Oral)   Resp 18   Ht 5\' 11"  (1.803 m)   Wt 98 kg   SpO2 100%   BMI 30.13 kg/m   General exam: Appears calm and comfortable Respiratory system: Clear to auscultation. Respiratory effort normal. Cardiovascular system: S1 & S2 heard, RRR. No murmurs, rubs, gallops or clicks. Gastrointestinal system: Abdomen is nondistended, soft and nontender. Normal bowel sounds heard. Central nervous system: Alert and oriented. No focal neurological deficits. Musculoskeletal: No edema. No calf tenderness Skin: No cyanosis. No rashes Psychiatry: Judgement and insight appear normal. Mood & affect appropriate.   Condition at discharge: stable  The results of significant diagnostics from this hospitalization (including imaging, microbiology, ancillary and laboratory) are listed below for reference.   Imaging Studies: CT Head Wo Contrast  Result Date: 05/07/2023 CLINICAL DATA:  37 year old male with weakness and dizziness, altered mental status. EXAM: CT HEAD WITHOUT CONTRAST TECHNIQUE: Contiguous axial images were obtained from the base of the skull through the vertex without intravenous contrast. RADIATION DOSE REDUCTION: This exam was  performed according to the departmental dose-optimization program which includes automated exposure control, adjustment of the mA and/or kV according to patient size and/or use of iterative reconstruction technique. COMPARISON:  Head CT 04/30/2023 and earlier. FINDINGS: Brain: Cavum septum pellucidum, normal variant. Cerebral volume is stable and within normal limits. No midline shift, ventriculomegaly, mass effect, evidence of mass lesion, intracranial hemorrhage or evidence of cortically based acute infarction. Gray-white matter differentiation is within normal limits throughout the brain. Vascular: No suspicious intracranial vascular hyperdensity. Skull: Intact, negative. Sinuses/Orbits: Visualized paranasal sinuses and mastoids are clear. Tympanic cavities appear clear. Other: Disconjugate gaze but similar to prior. Stable scalp soft tissues. IMPRESSION: Stable and negative noncontrast CT appearance of the brain. Electronically Signed   By: Odessa Fleming M.D.   On: 05/07/2023 07:12   DG Chest Port 1 View  Result Date: 05/07/2023 CLINICAL DATA:  37 year old male with weakness and dizziness. Altered mental status. EXAM: PORTABLE CHEST 1 VIEW COMPARISON:  Portable chest 04/12/2023 and earlier. FINDINGS: Portable AP semi upright view at 0621 hours. Mildly larger, improved lung volumes. Mildly rotated to the left today. Normal cardiac size and mediastinal contours. Visualized tracheal air column is within normal limits. Allowing for portable technique the lungs are clear. No pneumothorax or pleural effusion. No osseous abnormality identified. Negative visible bowel gas. IMPRESSION: Negative portable chest. Electronically Signed   By: Odessa Fleming M.D.   On: 05/07/2023 06:33   CT HEAD WO CONTRAST ( )  Result Date: 04/30/2023 CLINICAL DATA:  Headache. EXAM: CT HEAD WITHOUT CONTRAST TECHNIQUE: Contiguous axial images were obtained from the base of the skull through the vertex without intravenous contrast. RADIATION DOSE  REDUCTION: This exam was performed according to the departmental dose-optimization program which includes automated exposure control, adjustment of the mA and/or kV according to patient size and/or use of iterative reconstruction technique. COMPARISON:  04/20/2023 FINDINGS: Brain: There is no evidence for acute hemorrhage, hydrocephalus, mass lesion, or abnormal extra-axial fluid collection. No definite CT evidence for acute infarction. Old lacunar infarcts are seen in the basal ganglia bilaterally. Vascular: No hyperdense vessel or unexpected calcification. Skull: No evidence for fracture. No worrisome lytic or sclerotic lesion. Sinuses/Orbits: The visualized paranasal sinuses and mastoid air cells are clear. Visualized portions of the globes and intraorbital fat are unremarkable. Other: None. IMPRESSION: 1. Stable.  No acute intracranial abnormality. Electronically Signed   By: Kennith Center M.D.   On: 04/30/2023 06:54   CT Head Wo Contrast  Result Date: 04/20/2023 CLINICAL DATA:  Hypertension, unresponsive, altered mental status EXAM: CT HEAD WITHOUT CONTRAST TECHNIQUE: Contiguous axial images were obtained from the base of the skull through the vertex without intravenous contrast. RADIATION DOSE REDUCTION: This exam was performed according to the departmental dose-optimization program which includes automated exposure control, adjustment of the mA and/or kV according to patient size and/or use of iterative reconstruction technique. COMPARISON:  03/27/2023 FINDINGS: Brain: No evidence of acute infarction, hemorrhage, mass, mass effect, or midline shift. No hydrocephalus or extra-axial fluid collection. Vascular: No hyperdense vessel. Skull: Negative for fracture or focal lesion. Sinuses/Orbits: No acute finding.  Dysconjugate gaze. Other: The mastoid air cells are well aerated. IMPRESSION: No acute intracranial process. Electronically Signed   By: Elaina Pattee.D.  On: 04/20/2023 02:45   CT ABDOMEN  PELVIS W CONTRAST  Result Date: 04/12/2023 CLINICAL DATA:  Right lower quadrant abdominal pain. EXAM: CT ABDOMEN AND PELVIS WITH CONTRAST TECHNIQUE: Multidetector CT imaging of the abdomen and pelvis was performed using the standard protocol following bolus administration of intravenous contrast. RADIATION DOSE REDUCTION: This exam was performed according to the departmental dose-optimization program which includes automated exposure control, adjustment of the mA and/or kV according to patient size and/or use of iterative reconstruction technique. CONTRAST:  OMNIPAQUE IOHEXOL 300 MG/ML  SOLN COMPARISON:  CT abdomen pelvis dated 01/01/2023. FINDINGS: Lower chest: No acute abnormality. Hepatobiliary: No focal liver abnormality is seen. No gallstones, gallbladder wall thickening, or biliary dilatation. Pancreas: The main pancreatic duct is mildly dilated, measuring up to 5 mm in diameter. 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 No surrounding inflammatory changes. Spleen: Normal in size without focal abnormality. Adrenals/Urinary Tract: Adrenal glands are unremarkable. Kidneys are normal, without renal calculi, focal lesion, or hydronephrosis. Bladder is unremarkable. Stomach/Bowel: Stomach is within normal limits. Appendix appears normal. No evidence of bowel wall thickening, distention, or inflammatory changes. Vascular/Lymphatic: No significant vascular findings are present. No enlarged abdominal or pelvic lymph nodes. Reproductive: Prostate is unremarkable. Other: No abdominal wall hernia or abnormality. No abdominopelvic ascites. Musculoskeletal: No acute or significant osseous findings. IMPRESSION: 1. No acute findings in the abdomen or pelvis. 2. 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 No evidence of pancreatitis. Electronically Signed   By: Romona Curls M.D.   On: 04/12/2023 10:49   DG CHEST PORT 1 VIEW  Result Date: 04/12/2023 CLINICAL DATA:   Hypertension, shortness of breath, lethargy EXAM: PORTABLE CHEST - 1 VIEW COMPARISON:  03/27/2023 FINDINGS: Lungs clear. Heart size and mediastinal contours are within normal limits. No effusion. Visualized bones unremarkable. IMPRESSION: No acute cardiopulmonary disease. Electronically Signed   By: Corlis Leak M.D.   On: 04/12/2023 10:12    Microbiology: Results for orders placed or performed during the hospital encounter of 01/01/23  MRSA Next Gen by PCR, Nasal     Status: None   Collection Time: 01/01/23 10:25 PM   Specimen: Nasal Mucosa; Nasal Swab  Result Value Ref Range Status   MRSA by PCR Next Gen NOT DETECTED NOT DETECTED Final    Comment: (NOTE) The GeneXpert MRSA Assay (FDA approved for NASAL specimens only), is one component of a comprehensive MRSA colonization surveillance program. It is not intended to diagnose MRSA infection nor to guide or monitor treatment for MRSA infections. Test performance is not FDA approved in patients less than 42 years old. Performed at [facility], [address] [place], [place] [address]     Labs: CBC: Recent Labs  Lab 05/07/23 0623 05/08/23 0412  WBC 9.5 6.4  HGB 17.7* 15.7  HCT 50.1 46.0  MCV 81.6 82.7  PLT 270 194   Basic Metabolic Panel: Recent Labs  Lab 05/07/23 0754 05/08/23 0412  NA 138 128*  K 3.5 3.8  CL 97* 97*  CO2 27 21*  GLUCOSE 60* 337*  BUN 16 21*  CREATININE 1.14 0.98  CALCIUM 10.2 8.5*   Liver Function Tests: Recent Labs  Lab 05/07/23 0754  AST 21  ALT 15  ALKPHOS 89  BILITOT 0.7  PROT 8.8*  ALBUMIN 4.6   CBG: Recent Labs  Lab 05/08/23 0324 05/08/23 0502 05/08/23 0733 05/08/23 1000 05/08/23 1223  GLUCAP 402* 252* 72 140* 128*    Discharge  time spent: 35 minutes.  Signed: Jacquelin Hawking, MD [facility] 05/08/2023

## 2023-05-13 ENCOUNTER — Observation Stay (HOSPITAL_COMMUNITY)
Admission: EM | Admit: 2023-05-13 | Discharge: 2023-05-14 | Disposition: A | Discharge status: Home / Self Care | Payer: Self-pay | Attending: Internal Medicine | Admitting: Internal Medicine

## 2023-05-13 ENCOUNTER — Other Ambulatory Visit: Payer: Self-pay

## 2023-05-13 ENCOUNTER — Encounter (HOSPITAL_COMMUNITY): Payer: Self-pay | Admitting: Emergency Medicine

## 2023-05-13 ENCOUNTER — Emergency Department (HOSPITAL_COMMUNITY): Payer: Self-pay

## 2023-05-13 DIAGNOSIS — E1159 Type 2 diabetes mellitus with other circulatory complications: Secondary | ICD-10-CM | POA: Diagnosis present

## 2023-05-13 DIAGNOSIS — G9340 Encephalopathy, unspecified: Secondary | ICD-10-CM | POA: Diagnosis present

## 2023-05-13 DIAGNOSIS — N172 Acute kidney failure with medullary necrosis: Secondary | ICD-10-CM | POA: Insufficient documentation

## 2023-05-13 DIAGNOSIS — Z1152 Encounter for screening for COVID-19: Secondary | ICD-10-CM | POA: Insufficient documentation

## 2023-05-13 DIAGNOSIS — E1065 Type 1 diabetes mellitus with hyperglycemia: Secondary | ICD-10-CM | POA: Diagnosis present

## 2023-05-13 DIAGNOSIS — E1165 Type 2 diabetes mellitus with hyperglycemia: Secondary | ICD-10-CM | POA: Insufficient documentation

## 2023-05-13 DIAGNOSIS — I152 Hypertension secondary to endocrine disorders: Secondary | ICD-10-CM | POA: Diagnosis present

## 2023-05-13 DIAGNOSIS — E1122 Type 2 diabetes mellitus with diabetic chronic kidney disease: Secondary | ICD-10-CM

## 2023-05-13 DIAGNOSIS — R7989 Other specified abnormal findings of blood chemistry: Secondary | ICD-10-CM | POA: Diagnosis present

## 2023-05-13 DIAGNOSIS — R079 Chest pain, unspecified: Secondary | ICD-10-CM | POA: Insufficient documentation

## 2023-05-13 DIAGNOSIS — I10 Essential (primary) hypertension: Secondary | ICD-10-CM | POA: Insufficient documentation

## 2023-05-13 DIAGNOSIS — E1100 Type 2 diabetes mellitus with hyperosmolarity without nonketotic hyperglycemic-hyperosmolar coma (NKHHC): Principal | ICD-10-CM | POA: Diagnosis present

## 2023-05-13 DIAGNOSIS — G9341 Metabolic encephalopathy: Secondary | ICD-10-CM | POA: Diagnosis present

## 2023-05-13 DIAGNOSIS — N179 Acute kidney failure, unspecified: Secondary | ICD-10-CM | POA: Diagnosis present

## 2023-05-13 DIAGNOSIS — Z794 Long term (current) use of insulin: Secondary | ICD-10-CM | POA: Insufficient documentation

## 2023-05-13 DIAGNOSIS — R739 Hyperglycemia, unspecified: Principal | ICD-10-CM

## 2023-05-13 DIAGNOSIS — E871 Hypo-osmolality and hyponatremia: Secondary | ICD-10-CM | POA: Insufficient documentation

## 2023-05-13 LAB — CBG MONITORING, ED
Glucose-Capillary: >600 mg/dL (ref 70–99)
Glucose-Capillary: >600 mg/dL (ref 70–99)
Glucose-Capillary: >600 mg/dL (ref 70–99)

## 2023-05-13 LAB — BASIC METABOLIC PANEL
Anion gap: 13 (ref 5–15)
Anion gap: 11 (ref 5–15)
Anion gap: 12 (ref 5–15)
Anion gap: 9 (ref 5–15)
BUN: 26 mg/dL — ABNORMAL HIGH (ref 6–20)
BUN: 20 mg/dL (ref 6–20)
BUN: 17 mg/dL (ref 6–20)
BUN: 21 mg/dL — ABNORMAL HIGH (ref 6–20)
CO2: 23 mmol/L (ref 22–32)
CO2: 25 mmol/L (ref 22–32)
CO2: 24 mmol/L (ref 22–32)
CO2: 25 mmol/L (ref 22–32)
Calcium: 9.7 mg/dL (ref 8.9–10.3)
Calcium: 9.7 mg/dL (ref 8.9–10.3)
Calcium: 9.2 mg/dL (ref 8.9–10.3)
Calcium: 8.5 mg/dL — ABNORMAL LOW (ref 8.9–10.3)
Chloride: 83 mmol/L — ABNORMAL LOW (ref 98–111)
Chloride: 98 mmol/L (ref 98–111)
Chloride: 96 mmol/L — ABNORMAL LOW (ref 98–111)
Chloride: 93 mmol/L — ABNORMAL LOW (ref 98–111)
Creatinine, Ser: 1.35 mg/dL — ABNORMAL HIGH (ref 0.61–1.24)
Creatinine, Ser: 0.99 mg/dL (ref 0.61–1.24)
Creatinine, Ser: 0.87 mg/dL (ref 0.61–1.24)
Creatinine, Ser: 1.19 mg/dL (ref 0.61–1.24)
GFR, Estimated: >60 mL/min (ref >60)
GFR, Estimated: >60 mL/min (ref >60)
GFR, Estimated: >60 mL/min (ref >60)
GFR, Estimated: >60 mL/min (ref >60)
Glucose, Bld: 920 mg/dL (ref 70–99)
Glucose, Bld: 149 mg/dL — ABNORMAL HIGH (ref 70–99)
Glucose, Bld: 217 mg/dL — ABNORMAL HIGH (ref 70–99)
Glucose, Bld: 489 mg/dL — ABNORMAL HIGH (ref 70–99)
Potassium: 5.4 mmol/L — ABNORMAL HIGH (ref 3.5–5.1)
Potassium: 3.5 mmol/L (ref 3.5–5.1)
Potassium: 3.5 mmol/L (ref 3.5–5.1)
Potassium: 4 mmol/L (ref 3.5–5.1)
Sodium: 119 mmol/L — CL (ref 135–145)
Sodium: 134 mmol/L — ABNORMAL LOW (ref 135–145)
Sodium: 132 mmol/L — ABNORMAL LOW (ref 135–145)
Sodium: 127 mmol/L — ABNORMAL LOW (ref 135–145)

## 2023-05-13 LAB — CBC
HCT: 50.9 % (ref 39.0–52.0)
Hemoglobin: 17.1 g/dL — ABNORMAL HIGH (ref 13.0–17.0)
MCH: 28.4 pg (ref 26.0–34.0)
MCHC: 33.6 g/dL (ref 30.0–36.0)
MCV: 84.6 fL (ref 80.0–100.0)
Platelets: 196 10*3/uL (ref 150–400)
RBC: 6.02 MIL/uL — ABNORMAL HIGH (ref 4.22–5.81)
RDW: 11.9 % (ref 11.5–15.5)
WBC: 6.4 10*3/uL (ref 4.0–10.5)
nRBC: 0 % (ref 0.0–0.2)

## 2023-05-13 LAB — HIV ANTIBODY (ROUTINE TESTING W REFLEX): HIV Screen 4th Generation wRfx: NONREACTIVE

## 2023-05-13 LAB — URINALYSIS, ROUTINE W REFLEX MICROSCOPIC
Bacteria, UA: NONE SEEN
Bilirubin Urine: NEGATIVE
Glucose, UA: >=500 mg/dL — AB
Hgb urine dipstick: NEGATIVE
Ketones, ur: NEGATIVE mg/dL
Leukocytes,Ua: NEGATIVE
Nitrite: NEGATIVE
Protein, ur: NEGATIVE mg/dL
Specific Gravity, Urine: 1.025 (ref 1.005–1.030)
pH: 5 (ref 5.0–8.0)

## 2023-05-13 LAB — GLUCOSE, CAPILLARY
Glucose-Capillary: 143 mg/dL — ABNORMAL HIGH (ref 70–99)
Glucose-Capillary: 146 mg/dL — ABNORMAL HIGH (ref 70–99)
Glucose-Capillary: 154 mg/dL — ABNORMAL HIGH (ref 70–99)
Glucose-Capillary: 155 mg/dL — ABNORMAL HIGH (ref 70–99)
Glucose-Capillary: 183 mg/dL — ABNORMAL HIGH (ref 70–99)
Glucose-Capillary: 201 mg/dL — ABNORMAL HIGH (ref 70–99)
Glucose-Capillary: 213 mg/dL — ABNORMAL HIGH (ref 70–99)
Glucose-Capillary: 226 mg/dL — ABNORMAL HIGH (ref 70–99)
Glucose-Capillary: 270 mg/dL — ABNORMAL HIGH (ref 70–99)
Glucose-Capillary: 313 mg/dL — ABNORMAL HIGH (ref 70–99)
Glucose-Capillary: 322 mg/dL — ABNORMAL HIGH (ref 70–99)
Glucose-Capillary: 403 mg/dL — ABNORMAL HIGH (ref 70–99)
Glucose-Capillary: 414 mg/dL — ABNORMAL HIGH (ref 70–99)
Glucose-Capillary: 439 mg/dL — ABNORMAL HIGH (ref 70–99)
Glucose-Capillary: 462 mg/dL — ABNORMAL HIGH (ref 70–99)
Glucose-Capillary: 486 mg/dL — ABNORMAL HIGH (ref 70–99)
Glucose-Capillary: 498 mg/dL — ABNORMAL HIGH (ref 70–99)
Glucose-Capillary: 501 mg/dL (ref 70–99)
Glucose-Capillary: 560 mg/dL (ref 70–99)
Glucose-Capillary: >600 mg/dL (ref 70–99)

## 2023-05-13 LAB — BETA-HYDROXYBUTYRIC ACID: Beta-Hydroxybutyric Acid: 0.11 mmol/L (ref 0.05–0.27)

## 2023-05-13 LAB — RESP PANEL BY RT-PCR (RSV, FLU A&B, COVID)  RVPGX2
Influenza A by PCR: NEGATIVE
Influenza B by PCR: NEGATIVE
Resp Syncytial Virus by PCR: NEGATIVE
SARS Coronavirus 2 by RT PCR: NEGATIVE

## 2023-05-13 LAB — PHOSPHORUS: Phosphorus: 4 mg/dL (ref 2.5–4.6)

## 2023-05-13 LAB — OSMOLALITY: Osmolality: 293 mosm/kg (ref 275–295)

## 2023-05-13 LAB — TROPONIN I (HIGH SENSITIVITY)
Troponin I (High Sensitivity): 5 ng/L (ref <18)
Troponin I (High Sensitivity): 5 ng/L (ref <18)

## 2023-05-13 LAB — MAGNESIUM: Magnesium: 2.1 mg/dL (ref 1.7–2.4)

## 2023-05-13 LAB — MRSA NEXT GEN BY PCR, NASAL: MRSA by PCR Next Gen: DETECTED — AB

## 2023-05-13 MED ORDER — INSULIN DETEMIR 100 UNIT/ML ~~LOC~~ SOLN
20.0000 [IU] | SUBCUTANEOUS | Status: DC
  Administered 2023-05-13: 20 [IU] via SUBCUTANEOUS
  Filled 2023-05-13 (×2): qty 0.2

## 2023-05-13 MED ORDER — INSULIN ASPART 100 UNIT/ML IJ SOLN
4.0000 [IU] | Freq: Three times a day (TID) | INTRAMUSCULAR | Status: DC
  Administered 2023-05-13 – 2023-05-14 (×2): 4 [IU] via SUBCUTANEOUS

## 2023-05-13 MED ORDER — INSULIN REGULAR(HUMAN) IN NACL 100-0.9 UT/100ML-% IV SOLN: INTRAVENOUS | Status: DC

## 2023-05-13 MED ORDER — LACTATED RINGERS IV SOLN: INTRAVENOUS | Status: DC

## 2023-05-13 MED ORDER — INSULIN ASPART 100 UNIT/ML IJ SOLN: 0.0000 [IU] | Freq: Every day | INTRAMUSCULAR | Status: DC

## 2023-05-13 MED ORDER — DEXTROSE 50 % IV SOLN: 0.0000 mL | INTRAVENOUS | Status: DC | PRN

## 2023-05-13 MED ORDER — INSULIN ASPART 100 UNIT/ML IJ SOLN
10.0000 [IU] | Freq: Once | INTRAMUSCULAR | Status: AC
  Administered 2023-05-13: 10 [IU] via SUBCUTANEOUS
  Filled 2023-05-13: qty 0.1

## 2023-05-13 MED ORDER — INSULIN REGULAR(HUMAN) IN NACL 100-0.9 UT/100ML-% IV SOLN
INTRAVENOUS | Status: DC
  Administered 2023-05-13: 10.5 [IU]/h via INTRAVENOUS
  Filled 2023-05-13: qty 100

## 2023-05-13 MED ORDER — INSULIN ASPART 100 UNIT/ML IJ SOLN
0.0000 [IU] | Freq: Three times a day (TID) | INTRAMUSCULAR | Status: DC
  Administered 2023-05-14: 11 [IU] via SUBCUTANEOUS

## 2023-05-13 MED ORDER — CHLORHEXIDINE GLUCONATE CLOTH 2 % EX PADS: 6.0000 | MEDICATED_PAD | Freq: Every day | CUTANEOUS | Status: DC

## 2023-05-13 MED ORDER — ENOXAPARIN SODIUM 40 MG/0.4ML IJ SOSY
40.0000 mg | PREFILLED_SYRINGE | INTRAMUSCULAR | Status: DC
  Administered 2023-05-13 – 2023-05-14 (×2): 40 mg via SUBCUTANEOUS
  Filled 2023-05-13 (×2): qty 0.4

## 2023-05-13 MED ORDER — DEXTROSE IN LACTATED RINGERS 5 % IV SOLN: INTRAVENOUS | Status: DC

## 2023-05-13 MED ORDER — CHLORHEXIDINE GLUCONATE CLOTH 2 % EX PADS
6.0000 | MEDICATED_PAD | Freq: Every day | CUTANEOUS | Status: DC
  Administered 2023-05-13: 6 via TOPICAL

## 2023-05-13 MED ORDER — MUPIROCIN 2 % EX OINT
1.0000 | TOPICAL_OINTMENT | Freq: Two times a day (BID) | CUTANEOUS | Status: DC
  Administered 2023-05-13 – 2023-05-14 (×2): 1 via NASAL
  Filled 2023-05-13: qty 22

## 2023-05-13 MED ORDER — LACTATED RINGERS IV BOLUS
20.0000 mL/kg | Freq: Once | INTRAVENOUS | Status: AC
  Administered 2023-05-13: 2086 mL via INTRAVENOUS

## 2023-05-13 NOTE — ED Notes (Signed)
ED TO INPATIENT HANDOFF REPORT  Name/Age/Gender [name] [name] 37 y.o. male  Code Status    Code Status Orders  (From admission, onward)           Start     Ordered   05/13/23 0253  Full code  Continuous       Question:  By:  Answer:  Default: patient does not have capacity for decision making, no surrogate [name] prior directive available   05/13/23 0256           Code Status History     Date Active Date Inactive Code Status Order ID Comments User Context   05/07/2023 1247 05/09/2023 0008 Full Code [id number]  Maryln Gottron, MD ED   04/20/2023 0340 04/21/2023 2237 Full Code [id number]  Howerter, Justin B, DO ED   01/01/2023 2017 01/02/2023 2046 Full Code [id number]  Therisa Doyne, MD ED   08/12/2022 0839 08/14/2022 2117 Full Code [id number]  Belva Agee, MD ED   05/16/2022 2321 05/21/2022 2229 Full Code [id number]  Synetta Fail, MD ED   05/08/2022 1446 05/10/2022 1714 Full Code [id number]  Teddy Spike, DO ED   04/06/2022 0032 04/07/2022 1642 Full Code [id number]  Cherly Anderson, PA-C ED   04/05/2022 1744 04/06/2022 0020 Full Code [id number]  Lenard Lance, FNP ED   07/03/2019 0004 07/06/2019 1553 Full Code [id number]  Eduard Clos, MD ED       Home/SNF/Other Home  Chief Complaint Hyperosmolar hyperglycemic state (HHS) (HCC) [E11.00]  Level of Care/Admitting Diagnosis ED Disposition     ED Disposition  Admit   Condition  --   Comment  Hospital Area: [facility] [[id number]]  Level of Care: Stepdown [14]  Admit to SDU based on following criteria: Severe physiological/psychological symptoms:  Any diagnosis requiring assessment & intervention at least every 4 hours on an ongoing basis to obtain desired patient outcomes including stability and rehabilitation  May place patient in [facility] at [name] if equivalent level of care is available:: No  Covid Evaluation: Asymptomatic - no recent exposure (last 10  days) testing not required  Diagnosis: Hyperosmolar hyperglycemic state (HHS) [facility]) [1884166]  Admitting Physician: Hillary Bow [phone]  Attending Physician: Hillary Bow [phone]          Medical History Past Medical History:  Diagnosis Date   Diabetes mellitus without complication (HCC)    DKA (diabetic ketoacidosis) (HCC) 05/08/2022   HTN (hypertension) 05/08/2022   Hyperlipidemia    Hyperosmolar hyperglycemic state (HHS) (HCC) 05/08/2022   Hypertension    Hypoglycemia 07/02/2019   Pseudohyponatremia 05/08/2022    Allergies No Known Allergies  IV Location/Drains/Wounds Patient Lines/Drains/Airways Status     Active Line/Drains/Airways     Name Placement date Placement time Site Days   Peripheral IV 05/13/23 20 G 1.88" Anterior;Left;Proximal Forearm 05/13/23  0255  Forearm  less than 1            Labs/Imaging Results for orders placed [name] performed during the hospital encounter of 05/13/23 (from the past 48 hour(s))  CBG monitoring, ED     Status: Abnormal   Collection Time: 05/13/23 12:14 AM  Result Value Ref Range   Glucose-Capillary >600 (HH) 70 - 99 mg/dL    Comment: Glucose reference range applies only to samples taken after fasting for at least 8 hours.  Urinalysis, Routine w reflex microscopic -Urine, Clean Catch     Status: Abnormal   Collection Time: 05/13/23 12:20  AM  Result Value Ref Range   Color, Urine COLORLESS (A) YELLOW   APPearance CLEAR CLEAR   Specific Gravity, Urine 1.025 1.005 - 1.030   pH 5.0 5.0 - 8.0   Glucose, UA >=500 (A) NEGATIVE mg/dL   Hgb urine dipstick NEGATIVE NEGATIVE   Bilirubin Urine NEGATIVE NEGATIVE   Ketones, ur NEGATIVE NEGATIVE mg/dL   Protein, ur NEGATIVE NEGATIVE mg/dL   Nitrite NEGATIVE NEGATIVE   Leukocytes,Ua NEGATIVE NEGATIVE   RBC / HPF 0-5 0 - 5 RBC/hpf   WBC, UA 0-5 0 - 5 WBC/hpf   Bacteria, UA NONE SEEN NONE SEEN   Squamous Epithelial / HPF 0-5 0 - 5 /HPF   Mucus PRESENT     Comment: Performed  at [facility], [address] [place], [place] [address]  Resp panel by RT-PCR (RSV, Flu A&B, Covid) Anterior Nasal Swab     Status: None   Collection Time: 05/13/23 12:28 AM   Specimen: Anterior Nasal Swab  Result Value Ref Range   SARS Coronavirus 2 by RT PCR NEGATIVE NEGATIVE    Comment: (NOTE) SARS-CoV-2 target nucleic acids are NOT DETECTED.  The SARS-CoV-2 RNA is generally detectable in upper respiratory specimens during the acute phase of infection. The lowest concentration of SARS-CoV-2 viral copies this assay can detect is 138 copies/mL. A negative result does not preclude SARS-Cov-2 infection and should not be used as the sole basis for treatment [name] other patient management decisions. A negative result may occur with  improper specimen collection/handling, submission of specimen other than nasopharyngeal swab, presence of viral mutation(s) within the areas targeted by this assay, and inadequate number of viral copies(<138 copies/mL). A negative result must be combined with clinical observations, patient history, and epidemiological information. The expected result is Negative.  Fact Sheet for Patients:  BloggerCourse.com  Fact Sheet for Healthcare Providers:  SeriousBroker.it  This test is no t yet approved [name] cleared by the [place] FDA and  has been authorized for detection and/[name] diagnosis of SARS-CoV-2 by FDA under an Emergency Use Authorization (EUA). This EUA will remain  in effect (meaning this test can be used) for the duration of the COVID-19 declaration under Section 564(b)(1) of the Act, 21 U.S.C.section 360bbb-3(b)(1), unless the authorization is terminated  [name] revoked sooner.       Influenza A by PCR NEGATIVE NEGATIVE   Influenza B by PCR NEGATIVE NEGATIVE    Comment: (NOTE) The Xpert Xpress SARS-CoV-2/FLU/RSV plus assay is intended as an aid in the diagnosis of influenza from  Nasopharyngeal swab specimens and should not be used as a sole basis for treatment. Nasal washings and aspirates are unacceptable for Xpert Xpress SARS-CoV-2/FLU/RSV testing.  Fact Sheet for Patients: BloggerCourse.com  Fact Sheet for Healthcare Providers: SeriousBroker.it  This test is not yet approved [name] cleared by the [place] FDA and has been authorized for detection and/[name] diagnosis of SARS-CoV-2 by FDA under an Emergency Use Authorization (EUA). This EUA will remain in effect (meaning this test can be used) for the duration of the COVID-19 declaration under Section 564(b)(1) of the Act, 21 U.S.C. section 360bbb-3(b)(1), unless the authorization is terminated [name] revoked.     Resp Syncytial Virus by PCR NEGATIVE NEGATIVE    Comment: (NOTE) Fact Sheet for Patients: BloggerCourse.com  Fact Sheet for Healthcare Providers: SeriousBroker.it  This test is not yet approved [name] cleared by the [place] FDA and has been authorized for detection and/[name] diagnosis of SARS-CoV-2 by FDA under an Emergency  Use Authorization (EUA). This EUA will remain in effect (meaning this test can be used) for the duration of the COVID-19 declaration under Section 564(b)(1) of the Act, 21 U.S.C. section 360bbb-3(b)(1), unless the authorization is terminated [name] revoked.  Performed at [facility], [address] [place], [place] [address]   CBC     Status: Abnormal   Collection Time: 05/13/23 12:56 AM  Result Value Ref Range   WBC 6.4 4.0 - 10.5 K/uL   RBC 6.02 (H) 4.22 - 5.81 MIL/uL   Hemoglobin 17.1 (H) 13.0 - 17.0 g/dL   HCT 60.4 54.0 - 98.1 %   MCV 84.6 80.0 - 100.0 fL   MCH 28.4 26.0 - 34.0 pg   MCHC 33.6 30.0 - 36.0 g/dL   RDW 19.1 47.8 - 29.5 %   Platelets 196 150 - 400 K/uL   nRBC 0.0 0.0 - 0.2 %    Comment: Performed at [facility], [address] [place], [place] [address]  Troponin I (High Sensitivity)     Status: None   Collection Time: 05/13/23 12:56 AM  Result Value Ref Range   Troponin I (High Sensitivity) 5 <18 ng/L    Comment: (NOTE) Elevated high sensitivity troponin I (hsTnI) values and significant  changes across serial measurements may suggest ACS but many other  chronic and acute conditions are known to elevate hsTnI results.  Refer to the "Links" section for chest pain algorithms and additional  guidance. Performed at [facility], [address] [place], [place] [address]   Basic metabolic panel     Status: Abnormal   Collection Time: 05/13/23  1:12 AM  Result Value Ref Range   Sodium 119 (LL) 135 - 145 mmol/L    Comment: CRITICAL RESULT CALLED TO, READ BACK BY AND VERIFIED WITH PERRY,A RN @ 0205 05/13/23 BY CHILDRESS,E   Potassium 5.4 (H) 3.5 - 5.1 mmol/L    Comment: HEMOLYSIS AT THIS LEVEL MAY AFFECT RESULT   Chloride 83 (L) 98 - 111 mmol/L   CO2 23 22 - 32 mmol/L   Glucose, Bld 920 (HH) 70 - 99 mg/dL    Comment: CRITICAL RESULT CALLED TO, READ BACK BY AND VERIFIED WITH PERRY,A RN @ 0205 05/13/23 BY CHILDRESS,E Glucose reference range applies only to samples taken after fasting for at least 8 hours.    BUN 26 (H) 6 - 20 mg/dL   Creatinine, Ser 4.69 (H) 0.61 - 1.24 mg/dL   Calcium 9.7 8.9 - 62.9 mg/dL   GFR, Estimated >52 >84 mL/min    Comment: (NOTE) Calculated using the CKD-EPI Creatinine Equation (2021)    Anion gap 13 5 - 15    Comment: Performed at [facility], [address] [place], [place] [address]  CBG monitoring, ED     Status: Abnormal   Collection Time: 05/13/23  2:32 AM  Result Value Ref Range   Glucose-Capillary >600 (HH) 70 - 99 mg/dL    Comment: Glucose reference range applies only to samples taken after fasting for at least 8 hours.   No results found.  Pending Labs Unresulted Labs (From admission, onward)     Start     Ordered    05/13/23 0500  Basic metabolic panel  (Hyperglycemic Hyperosmolar State (HHS))  STAT Now then every 4 hours ,   R      05/13/23 0256   05/13/23 0255  Osmolality  (Hyperglycemic Hyperosmolar State (HHS))  Once,  R        05/13/23 0256   05/13/23 0253  HIV Antibody (routine testing w rflx)  (HIV Antibody (Routine testing w reflex) panel)  Once,   R        05/13/23 0256            Vitals/Pain Today's Vitals   05/13/23 0217 05/13/23 0225 05/13/23 0230 05/13/23 0245  BP:  (!) 152/116 (!) 150/111   Pulse:   89 95  Resp: 13 13 19 12   Temp:      TempSrc:      SpO2:   100% 100%  Weight:      Height:      PainSc:        Isolation Precautions No active isolations  Medications Medications  enoxaparin (LOVENOX) injection 40 mg (has no administration in time range)  lactated ringers bolus 2,086 mL (has no administration in time range)  insulin regular, human (MYXREDLIN) 100 units/ 100 mL infusion (has no administration in time range)  lactated ringers infusion (has no administration in time range)  dextrose 5 % in lactated ringers infusion (has no administration in time range)  dextrose 50 % solution 0-50 mL (has no administration in time range)  insulin aspart (novoLOG) injection 10 Units (10 Units Subcutaneous Given 05/13/23 0204)    Mobility walks

## 2023-05-13 NOTE — Progress Notes (Signed)
CROSS COVER NOTE  NAME: KYEN PUERTO MRN: [id number] DOB : 1986/01/30    Date of Service   05/13/2023   HPI/Events of Note   Nurse paged " Hey friend can you please come and evaulate patient as we transitioned and cbg remains in the 400-500 Need orders to keep the insulin/ endo tool thanks" Pt reportedly ate some food after initiation of insulin transition. He received 20 Units of Levemir at 1552 and is supposed to transition off the insulin drip 2 hours later but his BG trended up. CBG (last 3)  Recent Labs    05/13/23 2019 05/13/23 2053 05/13/23 2126  GLUCAP 560* 414* 322*     Interventions   Plan: Ordered stat BMP and BHB. Pt kept on insulin drip until labs result. Continuing on Insulin drip per endotool Will attempt to transition to Sub Q insulin when appropriate.       Kalaysia Demonbreun Lamin Geradine Girt, MSN, APRN, AGACNP-BC [facility] [place] Pager: [phone]. Check Amion for Availability

## 2023-05-13 NOTE — Inpatient Diabetes Management (Signed)
Inpatient Diabetes Program Recommendations  AACE/ADA: New Consensus Statement on Inpatient Glycemic Control (2015)  Target Ranges:  Prepandial:   less than 140 mg/dL      Peak postprandial:   less than 180 mg/dL (1-2 hours)      Critically ill patients:  140 - 180 mg/dL   Lab Results  Component Value Date   GLUCAP 213 (H) 05/13/2023   HGBA1C 12.4 (H) 04/20/2023    Review of Glycemic Control  Diabetes history: DM Outpatient Diabetes medications: Lantus 30 daily, Humalog 15 TID Current orders for Inpatient glycemic control: IV insulin per [employer] for HHS  Inpatient Diabetes Program Recommendations:    Continue with IV insulin until criteria met for discontinuation.  Tried to speak with pt at bedside, however too lethargic at time of visit.  This Diabetes Coordinator saw pt 5 days ago at prior hospitalization. See note dated 10/10.  Continue to follow.  Thank you. Ailene Ards, RD, LDN, CDCES Inpatient Diabetes Coordinator [phone]

## 2023-05-13 NOTE — Assessment & Plan Note (Signed)
Due to hyperglycemia.  Corrected sodium 139.

## 2023-05-13 NOTE — Assessment & Plan Note (Signed)
Suspect somnolence is due either to HHS vs substance abuse (sedating substance or just coming down off of methamphetamine).

## 2023-05-13 NOTE — ED Notes (Signed)
RN has informed MD Bebe Shaggy about pt critical labs of Glucose 920 and sodium of 119. RN inquired to see if MD could place an EJ due to the patient being stuck 9 times cumulatively by different staff members in attempt to establish IV access d/t CBG being greater than 600 on arrival. No one from the ED team has been successful and an order has been placed for IV team and no one has come down yet at this time. MD reports he will be in to see pt shortly. Pt on cardiac monitor, bp cuff and pulse ox, bed low and locked rails x2 , call light in reach.

## 2023-05-13 NOTE — ED Triage Notes (Signed)
Pt bib EMS with c/o hyperglycemia and chest pain with his cough.   Cbg 436

## 2023-05-13 NOTE — ED Provider Notes (Signed)
[place] EMERGENCY DEPARTMENT AT [facility] Provider Note   CSN: [id number] Arrival date & time: 05/13/23  0004     History  Chief Complaint  Patient presents with   Hyperglycemia   Chest Pain    [name] [name] is a 37 y.o. male.  The history is provided by the patient.  Patient with history of diabetes presents with hyperglycemia chest pain.  Patient reports every time he coughs it hurts his chest.  He is also concerned that his glucose is high. He reports he is taking his medications. Patient is a poor historian and does not offer any other details     Home Medications Prior to Admission medications   Medication Sig Start Date End Date Taking? Authorizing Provider  glucose blood (ONETOUCH ULTRA TEST) test strip Use 1 strip to check blood sugar in the morning, at noon, in the evening, and at bedtime. 04/21/23   Hongalgi, Maximino Greenland, MD  insulin glargine (LANTUS) 100 UNIT/ML Solostar Pen Inject 25 Units into the skin at bedtime. 04/21/23   Hongalgi, Maximino Greenland, MD  insulin lispro (HUMALOG) 100 UNIT/ML KwikPen Inject 8 Units into the skin 3 (three) times daily before meals. 05/08/23   Narda Bonds, MD  Insulin Pen Needle (PEN NEEDLES 31GX5/16") 31G X 8 MM MISC Use to inject insulin in the morning, at noon, in the evening, and at bedtime. 04/21/23   Hongalgi, Maximino Greenland, MD  Lancets MISC Use 1 each in the morning, at noon, in the evening, and at bedtime. 04/21/23   Hongalgi, Maximino Greenland, MD  lisinopril (ZESTRIL) 10 MG tablet Take 1 tablet (10 mg total) by mouth daily. Patient not taking: Reported on 05/07/2023 04/21/23 05/21/23  Elease Etienne, MD      Allergies    Patient has no known allergies.    Review of Systems   Review of Systems  Physical Exam Updated Vital Signs BP (!) 152/116   Pulse 99   Temp 98.5 F (36.9 C) (Oral)   Resp 13   Ht 1.803 m (5\' 11" )   Wt 104.3 kg   SpO2 100%   BMI 32.08 kg/m  Physical Exam CONSTITUTIONAL: Disheveled, no acute distress,  does not smell of ketones HEAD: Normocephalic/atraumatic EYES: EOMI ENMT: Mucous membranes dry NECK: supple no meningeal signs SPINE/BACK:entire spine nontender CV: S1/S2 noted, no murmurs/rubs/gallops noted LUNGS: Lungs are clear to auscultation bilaterally, no apparent distress ABDOMEN: soft, nontender NEURO: Pt is awake/alert/appropriate, moves all extremitiesx4.  No facial droop.   EXTREMITIES: pulses normal/equal, full ROM, no lower extremity edema SKIN: warm, color normal PSYCH: no abnormalities of mood noted, alert and oriented to situation  ED Results / Procedures / Treatments   Labs (all labs ordered are listed, but only abnormal results are displayed) Labs Reviewed  CBC - Abnormal; Notable for the following components:      Result Value   RBC 6.02 (*)    Hemoglobin 17.1 (*)    All other components within normal limits  URINALYSIS, ROUTINE W REFLEX MICROSCOPIC - Abnormal; Notable for the following components:   Color, Urine COLORLESS (*)    Glucose, UA >=500 (*)    All other components within normal limits  BASIC METABOLIC PANEL - Abnormal; Notable for the following components:   Sodium 119 (*)    Potassium 5.4 (*)    Chloride 83 (*)    Glucose, Bld 920 (*)    BUN 26 (*)    Creatinine, Ser 1.35 (*)  All other components within normal limits  CBG MONITORING, ED - Abnormal; Notable for the following components:   Glucose-Capillary >600 (*)    All other components within normal limits  CBG MONITORING, ED - Abnormal; Notable for the following components:   Glucose-Capillary >600 (*)    All other components within normal limits  RESP PANEL BY RT-PCR (RSV, FLU A&B, COVID)  RVPGX2  TROPONIN I (HIGH SENSITIVITY)  TROPONIN I (HIGH SENSITIVITY)    EKG EKG Interpretation Date/Time:  Tuesday May 13 2023 00:22:34 EDT Ventricular Rate:  89 PR Interval:  138 QRS Duration:  117 QT Interval:  365 QTC Calculation: 445 R Axis:   -28  Text Interpretation: Sinus  rhythm Probable left atrial enlargement Incomplete RBBB and LAFB No significant change since last tracing Confirmed by Zadie Rhine ([id number]) on 05/13/2023 12:52:51 AM  Radiology No results found.  Procedures .Critical Care  Performed by: Zadie Rhine, MD Authorized by: Zadie Rhine, MD   Critical care provider statement:    Critical care time (minutes):  45   Critical care start time:  05/13/2023 2:00 AM   Critical care end time:  05/13/2023 2:45 AM   Critical care time was exclusive of:  Separately billable procedures and treating other patients   Critical care was necessary to treat or prevent imminent or life-threatening deterioration of the following conditions:  Shock, renal failure, metabolic crisis and endocrine crisis   Critical care was time spent personally by me on the following activities:  Ordering and review of laboratory studies, development of treatment plan with patient or surrogate, evaluation of patient's response to treatment, examination of patient, ordering and performing treatments and interventions, pulse oximetry, ordering and review of radiographic studies and re-evaluation of patient's condition   I assumed direction of critical care for this patient from another provider in my specialty: no     Care discussed with: admitting provider       Medications Ordered in ED Medications  insulin regular, human (MYXREDLIN) 100 units/ 100 mL infusion (has no administration in time range)  dextrose 5 % in lactated ringers infusion (has no administration in time range)  dextrose 50 % solution 0-50 mL (has no administration in time range)  insulin aspart (novoLOG) injection 10 Units (10 Units Subcutaneous Given 05/13/23 0204)    ED Course/ Medical Decision Making/ A&P Clinical Course as of 05/13/23 0245  Tue May 13, 2023  0118 Patient with history of diabetes, substance use disorder, housing insecurity presents with chest pain and hyperglycemia.  Patient  currently in no acute distress.  However he is hyperglycemic.  Imaging and labs are pending at this time [DW]  0227 Glucose(!!): 920 Significant hyperglycemia [DW]  0227 Creatinine(!): 1.35 Acute kidney injury [DW]  0243 Patient found to have significant hyperglycemia with pseudohyponatremia.  IV insulin drip has been ordered.  Discussed with Dr. Julian Reil for admission. [DW]    Clinical Course User Index [DW] Zadie Rhine, MD                                 Medical Decision Making Amount and/or Complexity of Data Reviewed Labs: ordered. Decision-making details documented in ED Course. Radiology: ordered.  Risk Prescription drug management. Decision regarding hospitalization.   This patient presents to the ED for concern of chest pain, this involves an extensive number of treatment options, and is a complaint that carries with it a high risk of complications and morbidity.  The differential diagnosis includes but is not limited to acute coronary syndrome, aortic dissection, pulmonary embolism, pericarditis, pneumothorax, pneumonia, myocarditis, pleurisy, esophageal rupture    Comorbidities that complicate the patient evaluation: Patient's presentation is complicated by their history of diabetes  Social Determinants of Health: Patient's lack of insurance, unhoused, and impaired access to primary care  increases the complexity of managing their presentation  Additional history obtained: Records reviewed previous admission documents  Lab Tests: I Ordered, and personally interpreted labs.  The pertinent results include: Hyperglycemia  Imaging Studies ordered: I ordered imaging studies including X-ray chest   I independently visualized and interpreted imaging which showed no acute findings I agree with the radiologist interpretation  Cardiac Monitoring: The patient was maintained on a cardiac monitor.  I personally viewed and interpreted the cardiac monitor which showed an  underlying rhythm of:  sinus rhythm  Medicines ordered and prescription drug management: I ordered medication including insulin for hyperglycemia  Critical Interventions:   admission and IV insulin  Consultations Obtained: I requested consultation with the admitting physician [facility] , and discussed  findings as well as pertinent plan - they recommend: Admit  Reevaluation: After the interventions noted above, I reevaluated the patient and found that they have :stayed the same  Complexity of problems addressed: Patient's presentation is most consistent with  acute presentation with potential threat to life or bodily function  Disposition: After consideration of the diagnostic results and the patient's response to treatment,  I feel that the patent would benefit from admission   .           Final Clinical Impression(s) / ED Diagnoses Final diagnoses:  Hyperglycemia  AKI (acute kidney injury) [facility])    Rx / DC Orders ED Discharge Orders     None         Zadie Rhine, MD 05/13/23 [phone]

## 2023-05-13 NOTE — TOC Progression Note (Signed)
Transition of Care [facility]) - Progression Note    Patient Details  Name: [name] [name] MRN: [id number] Date of Birth: July 31, 1985  Transition of Care [facility]) CM/SW Contact  Darleene Cleaver, [place] Phone Number: 05/13/2023, 11:38 AM  Clinical Narrative:     CSW attempted to see patient to address his SDOH needs, however he is still too lethargic to speak to CSW.  Will follow up at a later time once he is more awake.       Expected Discharge Plan and Services                                               Social Determinants of Health (SDOH) Interventions SDOH Screenings   Food Insecurity: Food Insecurity Present (05/13/2023)  Housing: Medium Risk (05/13/2023)  Transportation Needs: Unmet Transportation Needs (05/13/2023)  Utilities: At Risk (05/13/2023)  Tobacco Use: Low Risk  (05/13/2023)    Readmission Risk Interventions     No data to display

## 2023-05-13 NOTE — Assessment & Plan Note (Signed)
Almost certainly due to non-adherence to medication regimen, with extensive and frequent h/o same. HHS pathway IVF per pathway Insulin gtt BMP Q4H

## 2023-05-13 NOTE — ED Notes (Signed)
Save blue, gold, lav, LT green and red tube in main lab

## 2023-05-13 NOTE — H&P (Signed)
History and Physical    Patient: [name] [name] WUJ:[id number] DOB: 05-Jan-1986 DOA: 05/13/2023 DOS: the patient was seen and examined on 05/13/2023 PCP: [name], NP  Patient coming from: Home  Chief Complaint:  Chief Complaint  Patient presents with   Hyperglycemia   Chest Pain   HPI: [name] [name] is a 37 y.o. male with medical history significant of DM, homelessness, polysubstance abuse (seems to prefer methamphetamine), frequent ED visits and admits for DKA / HHS and/or hypoglycemia.  Pt in to ED today with somnolence, and CBG of 920 as per his usual.  Just discharged from our service after being admitted for HYPOglycemia a mere 5 days ago.   Review of Systems: As mentioned in the history of present illness. All other systems reviewed and are negative. Past Medical History:  Diagnosis Date   Diabetes mellitus without complication (HCC)    DKA (diabetic ketoacidosis) (HCC) 05/08/2022   HTN (hypertension) 05/08/2022   Hyperlipidemia    Hyperosmolar hyperglycemic state (HHS) (HCC) 05/08/2022   Hypertension    Hypoglycemia 07/02/2019   Pseudohyponatremia 05/08/2022   Past Surgical History:  Procedure Laterality Date   HAND SURGERY     Social History:  reports that he has never smoked. He has never used smokeless tobacco. He reports current drug use. Drug: Amphetamines. He reports that he does not drink alcohol.  No Known Allergies  Family History  Family history unknown: Yes    Prior to Admission medications   Medication Sig Start Date End Date Taking? Authorizing Provider  glucose blood (ONETOUCH ULTRA TEST) test strip Use 1 strip to check blood sugar in the morning, at noon, in the evening, and at bedtime. 04/21/23   Hongalgi, Maximino Greenland, MD  insulin glargine (LANTUS) 100 UNIT/ML Solostar Pen Inject 25 Units into the skin at bedtime. 04/21/23   Hongalgi, Maximino Greenland, MD  insulin lispro (HUMALOG) 100 UNIT/ML KwikPen Inject 8 Units into the skin 3 (three) times daily  before meals. 05/08/23   Narda Bonds, MD  Insulin Pen Needle (PEN NEEDLES 31GX5/16") 31G X 8 MM MISC Use to inject insulin in the morning, at noon, in the evening, and at bedtime. 04/21/23   Hongalgi, Maximino Greenland, MD  Lancets MISC Use 1 each in the morning, at noon, in the evening, and at bedtime. 04/21/23   Hongalgi, Maximino Greenland, MD  lisinopril (ZESTRIL) 10 MG tablet Take 1 tablet (10 mg total) by mouth daily. Patient not taking: Reported on 05/07/2023 04/21/23 05/21/23  Elease Etienne, MD    Physical Exam: Vitals:   05/13/23 0217 05/13/23 0225 05/13/23 0230 05/13/23 0245  BP:  (!) 152/116 (!) 150/111   Pulse:   89 95  Resp: 13 13 19 12   Temp:      TempSrc:      SpO2:   100% 100%  Weight:      Height:       Constitutional: Disheveled, NAD Respiratory: clear to auscultation bilaterally, no wheezing, no crackles. Normal respiratory effort. No accessory muscle use.  Cardiovascular: Regular rate and rhythm, no murmurs / rubs / gallops. No extremity edema. 2+ pedal pulses. No carotid bruits.  Abdomen: no tenderness, no masses palpated. No hepatosplenomegaly. Bowel sounds positive.  Neurologic: CN 2-12 grossly intact. Sensation intact, DTR normal. Strength 5/5 in all 4.  Psychiatric: Sleepy, but wakes to voice.  Data Reviewed:     Labs on Admission: I have personally reviewed following labs and imaging studies  CBC: Recent Labs  Lab 05/07/23 0623 05/08/23 0412 05/13/23 0056  WBC 9.5 6.4 6.4  HGB 17.7* 15.7 17.1*  HCT 50.1 46.0 50.9  MCV 81.6 82.7 84.6  PLT 270 194 196   Basic Metabolic Panel: Recent Labs  Lab 05/07/23 0754 05/08/23 0412 05/13/23 0112  NA 138 128* 119*  K 3.5 3.8 5.4*  CL 97* 97* 83*  CO2 27 21* 23  GLUCOSE 60* 337* 920*  BUN 16 21* 26*  CREATININE 1.14 0.98 1.35*  CALCIUM 10.2 8.5* 9.7   GFR: Estimated Creatinine Clearance: 91.2 mL/min (A) (by C-G formula based on SCr of 1.35 mg/dL (H)). Liver Function Tests: Recent Labs  Lab 05/07/23 0754  AST  21  ALT 15  ALKPHOS 89  BILITOT 0.7  PROT 8.8*  ALBUMIN 4.6   No results for input(s): "LIPASE", "AMYLASE" in the last 168 hours. No results for input(s): "AMMONIA" in the last 168 hours. Coagulation Profile: No results for input(s): "INR", "PROTIME" in the last 168 hours. Cardiac Enzymes: No results for input(s): "CKTOTAL", "CKMB", "CKMBINDEX", "TROPONINI" in the last 168 hours. BNP (last 3 results) No results for input(s): "PROBNP" in the last 8760 hours. HbA1C: No results for input(s): "HGBA1C" in the last 72 hours. CBG: Recent Labs  Lab 05/08/23 1000 05/08/23 1223 05/08/23 1753 05/13/23 0014 05/13/23 0232  GLUCAP 140* 128* 302* >600* >600*   Lipid Profile: No results for input(s): "CHOL", "HDL", "LDLCALC", "TRIG", "CHOLHDL", "LDLDIRECT" in the last 72 hours. Thyroid Function Tests: No results for input(s): "TSH", "T4TOTAL", "FREET4", "T3FREE", "THYROIDAB" in the last 72 hours. Anemia Panel: No results for input(s): "VITAMINB12", "FOLATE", "FERRITIN", "TIBC", "IRON", "RETICCTPCT" in the last 72 hours. Urine analysis:    Component Value Date/Time   COLORURINE COLORLESS (A) 05/13/2023 0020   APPEARANCEUR CLEAR 05/13/2023 0020   LABSPEC 1.025 05/13/2023 0020   PHURINE 5.0 05/13/2023 0020   GLUCOSEU >=500 (A) 05/13/2023 0020   HGBUR NEGATIVE 05/13/2023 0020   BILIRUBINUR NEGATIVE 05/13/2023 0020   KETONESUR NEGATIVE 05/13/2023 0020   PROTEINUR NEGATIVE 05/13/2023 0020   NITRITE NEGATIVE 05/13/2023 0020   LEUKOCYTESUR NEGATIVE 05/13/2023 0020    Radiological Exams on Admission: No results found.  EKG: Independently reviewed.   Assessment and Plan: * Hyperosmolar hyperglycemic state (HHS) (HCC) Almost certainly due to non-adherence to medication regimen, with extensive and frequent h/o same. HHS pathway IVF per pathway Insulin gtt BMP Q4H  Acute encephalopathy Suspect somnolence is due either to HHS vs substance abuse (sedating substance or just coming  down off of methamphetamine).  Pseudohyponatremia Due to hyperglycemia.  Corrected sodium 139.      Advance Care Planning:   Code Status: Full Code  Consults: None  Family Communication: No family in room  Severity of Illness: The appropriate patient status for this patient is OBSERVATION. Observation status is judged to be reasonable and necessary in order to provide the required intensity of service to ensure the patient's safety. The patient's presenting symptoms, physical exam findings, and initial radiographic and laboratory data in the context of their medical condition is felt to place them at decreased risk for further clinical deterioration. Furthermore, it is anticipated that the patient will be medically stable for discharge from the hospital within 2 midnights of admission.   Author: Hillary Bow., DO 05/13/2023 3:13 AM  For on call review [email or web address].

## 2023-05-13 NOTE — Progress Notes (Signed)
Patient seen and examined.  Admitted early morning hours by nighttime hospitalist.  37 year old gentleman with insulin-dependent diabetes, polysubstance abuse, recent admission for hypoglycemia, noncompliance brought to the ED from the street with somnolence, blood sugars 920 without anion gap.  Patient was lethargic. Patient seen and examined.  He is still very sleepy, he responds and answers appropriate but not awake enough to start oral intake.  Anion gap is 11.  Blood sugars 110.  Clinically improving but unable to eat yet.  Will continue insulin infusion along with dextrose or Ringer lactate until he is able to eat some meal, once he is able to eat, will change to subcu insulin.  Electrolytes including potassium, phosphorus and magnesium was checked and they were normal. Likely we can do more counseling when he is more awake.  Same-day admit.  No charge visit.

## 2023-05-13 NOTE — ED Notes (Signed)
Attempted to call IV team for patient. No answer from IV team

## 2023-05-13 NOTE — Plan of Care (Signed)
  Problem: Cardiac: Goal: Ability to maintain an adequate cardiac output will improve Outcome: Progressing   Problem: Health Behavior/Discharge Planning: Goal: Ability to identify and utilize available resources and services will improve Outcome: Progressing

## 2023-05-14 ENCOUNTER — Emergency Department (HOSPITAL_COMMUNITY): Payer: Self-pay

## 2023-05-14 ENCOUNTER — Encounter (HOSPITAL_COMMUNITY): Payer: Self-pay | Admitting: Emergency Medicine

## 2023-05-14 ENCOUNTER — Other Ambulatory Visit (HOSPITAL_COMMUNITY): Payer: Self-pay

## 2023-05-14 ENCOUNTER — Emergency Department (HOSPITAL_COMMUNITY)
Admission: EM | Admit: 2023-05-14 | Discharge: 2023-05-15 | Discharge status: Against Medical Advice | Payer: Self-pay | Attending: Emergency Medicine | Admitting: Emergency Medicine

## 2023-05-14 DIAGNOSIS — Z5321 Procedure and treatment not carried out due to patient leaving prior to being seen by health care provider: Secondary | ICD-10-CM | POA: Insufficient documentation

## 2023-05-14 DIAGNOSIS — G9341 Metabolic encephalopathy: Secondary | ICD-10-CM

## 2023-05-14 DIAGNOSIS — N179 Acute kidney failure, unspecified: Secondary | ICD-10-CM

## 2023-05-14 DIAGNOSIS — R079 Chest pain, unspecified: Secondary | ICD-10-CM | POA: Insufficient documentation

## 2023-05-14 LAB — CBC WITH DIFFERENTIAL/PLATELET
Abs Immature Granulocytes: 0.01 10*3/uL (ref 0.00–0.07)
Basophils Absolute: 0 10*3/uL (ref 0.0–0.1)
Basophils Relative: 1 %
Eosinophils Absolute: 0.1 10*3/uL (ref 0.0–0.5)
Eosinophils Relative: 1 %
HCT: 44 % (ref 39.0–52.0)
Hemoglobin: 14.9 g/dL (ref 13.0–17.0)
Immature Granulocytes: 0 %
Lymphocytes Relative: 35 %
Lymphs Abs: 2.3 10*3/uL (ref 0.7–4.0)
MCH: 28 pg (ref 26.0–34.0)
MCHC: 33.9 g/dL (ref 30.0–36.0)
MCV: 82.7 fL (ref 80.0–100.0)
Monocytes Absolute: 0.5 10*3/uL (ref 0.1–1.0)
Monocytes Relative: 7 %
Neutro Abs: 3.6 10*3/uL (ref 1.7–7.7)
Neutrophils Relative %: 56 %
Platelets: 263 10*3/uL (ref 150–400)
RBC: 5.32 MIL/uL (ref 4.22–5.81)
RDW: 12.1 % (ref 11.5–15.5)
WBC: 6.5 10*3/uL (ref 4.0–10.5)
nRBC: 0 % (ref 0.0–0.2)

## 2023-05-14 LAB — BASIC METABOLIC PANEL
Anion gap: 9 (ref 5–15)
BUN: 20 mg/dL (ref 6–20)
CO2: 26 mmol/L (ref 22–32)
Calcium: 8.9 mg/dL (ref 8.9–10.3)
Chloride: 100 mmol/L (ref 98–111)
Creatinine, Ser: 0.98 mg/dL (ref 0.61–1.24)
GFR, Estimated: >60 mL/min (ref >60)
Glucose, Bld: 170 mg/dL — ABNORMAL HIGH (ref 70–99)
Potassium: 3.8 mmol/L (ref 3.5–5.1)
Sodium: 135 mmol/L (ref 135–145)

## 2023-05-14 LAB — GLUCOSE, CAPILLARY
Glucose-Capillary: 160 mg/dL — ABNORMAL HIGH (ref 70–99)
Glucose-Capillary: 162 mg/dL — ABNORMAL HIGH (ref 70–99)
Glucose-Capillary: 174 mg/dL — ABNORMAL HIGH (ref 70–99)
Glucose-Capillary: 285 mg/dL — ABNORMAL HIGH (ref 70–99)
Glucose-Capillary: 304 mg/dL — ABNORMAL HIGH (ref 70–99)

## 2023-05-14 LAB — COMPREHENSIVE METABOLIC PANEL
ALT: 15 U/L (ref 0–44)
AST: 21 U/L (ref 15–41)
Albumin: 3.2 g/dL — ABNORMAL LOW (ref 3.5–5.0)
Alkaline Phosphatase: 78 U/L (ref 38–126)
Anion gap: 11 (ref 5–15)
BUN: 16 mg/dL (ref 6–20)
CO2: 23 mmol/L (ref 22–32)
Calcium: 9.2 mg/dL (ref 8.9–10.3)
Chloride: 101 mmol/L (ref 98–111)
Creatinine, Ser: 1.37 mg/dL — ABNORMAL HIGH (ref 0.61–1.24)
GFR, Estimated: >60 mL/min (ref >60)
Glucose, Bld: 368 mg/dL — ABNORMAL HIGH (ref 70–99)
Potassium: 3.9 mmol/L (ref 3.5–5.1)
Sodium: 135 mmol/L (ref 135–145)
Total Bilirubin: 0.4 mg/dL (ref 0.3–1.2)
Total Protein: 6.6 g/dL (ref 6.5–8.1)

## 2023-05-14 LAB — CBG MONITORING, ED: Glucose-Capillary: 423 mg/dL — ABNORMAL HIGH (ref 70–99)

## 2023-05-14 LAB — TROPONIN I (HIGH SENSITIVITY): Troponin I (High Sensitivity): 6 ng/L (ref <18)

## 2023-05-14 MED ORDER — BLOOD GLUCOSE TEST VI STRP
1.0000 | ORAL_STRIP | Freq: Three times a day (TID) | 0 refills | Status: DC
  Filled 2023-05-14: qty 100, 34d supply, fill #0

## 2023-05-14 MED ORDER — INSULIN ASPART 100 UNIT/ML FLEXPEN
8.0000 [IU] | PEN_INJECTOR | Freq: Three times a day (TID) | SUBCUTANEOUS | 0 refills | Status: DC
  Filled 2023-05-14: qty 6, 25d supply, fill #0

## 2023-05-14 MED ORDER — INSULIN DETEMIR 100 UNIT/ML ~~LOC~~ SOLN
25.0000 [IU] | SUBCUTANEOUS | Status: DC
  Administered 2023-05-14: 25 [IU] via SUBCUTANEOUS
  Filled 2023-05-14: qty 0.25

## 2023-05-14 MED ORDER — PEN NEEDLES 31G X 5 MM MISC
1.0000 | Freq: Three times a day (TID) | 0 refills | Status: DC
  Filled 2023-05-14: qty 100, 30d supply, fill #0

## 2023-05-14 MED ORDER — INSULIN ASPART 100 UNIT/ML IJ SOLN
0.0000 [IU] | Freq: Three times a day (TID) | INTRAMUSCULAR | Status: DC
  Administered 2023-05-14: 8 [IU] via SUBCUTANEOUS

## 2023-05-14 MED ORDER — BLOOD GLUCOSE MONITOR KIT
1.0000 | PACK | Freq: Three times a day (TID) | 0 refills | Status: DC
  Filled 2023-05-14: qty 1, 30d supply, fill #0

## 2023-05-14 MED ORDER — LANCET DEVICE MISC
1.0000 | Freq: Three times a day (TID) | 0 refills | Status: DC
  Filled 2023-05-14: qty 1, fill #0

## 2023-05-14 MED ORDER — INSULIN ASPART 100 UNIT/ML IJ SOLN
8.0000 [IU] | Freq: Three times a day (TID) | INTRAMUSCULAR | Status: DC
  Administered 2023-05-14: 8 [IU] via SUBCUTANEOUS

## 2023-05-14 MED ORDER — INSULIN GLARGINE-YFGN 100 UNIT/ML ~~LOC~~ SOPN
25.0000 [IU] | PEN_INJECTOR | Freq: Every day | SUBCUTANEOUS | 0 refills | Status: DC
  Filled 2023-05-14: qty 6, 24d supply, fill #0

## 2023-05-14 MED ORDER — LISINOPRIL 20 MG PO TABS
20.0000 mg | ORAL_TABLET | Freq: Every day | ORAL | 0 refills | Status: DC
  Filled 2023-05-14: qty 30, 30d supply, fill #0

## 2023-05-14 MED ORDER — LANCETS MISC
1.0000 | Freq: Three times a day (TID) | 0 refills | Status: DC
  Filled 2023-05-14: qty 100, 30d supply, fill #0

## 2023-05-14 NOTE — ED Notes (Signed)
Pt dropped stickers off at sort desk, and no response when name was called.

## 2023-05-14 NOTE — Inpatient Diabetes Management (Signed)
Inpatient Diabetes Program Recommendations  AACE/ADA: New Consensus Statement on Inpatient Glycemic Control (2015)  Target Ranges:  Prepandial:   less than 140 mg/dL      Peak postprandial:   less than 180 mg/dL (1-2 hours)      Critically ill patients:  140 - 180 mg/dL   Lab Results  Component Value Date   GLUCAP 285 (H) 05/14/2023   HGBA1C 12.4 (H) 04/20/2023    Review of Glycemic Control  Diabetes history: DM1 Outpatient Diabetes medications: Lantus 30 daily, Humalog 15 TID Current orders for Inpatient glycemic control: Levemir 25 Q24H, Novolog 0-15 TID with meals and 0-5 HS + 8 units TID  Inpatient Diabetes Program Recommendations:    Semglee 25 units at bedtime Novolog 8 units TID  [facility] pharmacy to deliver meds to bedside.   Appt with PCP 06/06/23 at [facility] @ 2 pm  Attempted to speak with pt at bedside today, would not open eyes and appeared to be sleeping. Diabetes Coordinators have spoken to pt numerous times during hospitalizations.   Thank you. Ailene Ards, RD, LDN, CDCES Inpatient Diabetes Coordinator [phone]

## 2023-05-14 NOTE — ED Triage Notes (Signed)
Pt called 911 from sheets. Here earlier at 1200. CBG 490. Pt presents in no distress. He was asleep when EMS arrived.

## 2023-05-14 NOTE — Hospital Course (Addendum)
37 year old homeless male with past medical history of diabetes mellitus type 2, polysubstance abuse who presented to [facility] emergency department on 10/15 with somnolence and severe hyperglycemia.  Patient reported having run out of his outpatient insulin.  Upon evaluation in the emergency department patient was found to be in hyperosmolar nonketotic state.  Patient was initiated on intravenous fluids and insulin infusion and admitted to the stepdown unit under the hospitalist service.  Patient's hyperglycemia and volume depletion quickly improved.  Patient was able to quickly be transitioned off of the insulin infusion.  Patient strength returned and patient tolerated oral intake without issue.  TOC consultation was obtained and arrangements were made for the patient to receive insulin FlexPen's via the match program.  Arrangements were also made for the patient to follow-up with a new primary care provider.  Patient was discharged home in improved and stable condition on 10/16.

## 2023-05-14 NOTE — Discharge Summary (Signed)
Physician Discharge Summary   Patient: [name] [name] MRN: [id number] DOB: 11-08-1985  Admit date:     05/13/2023  Discharge date: 05/14/2023  Discharge Physician: Marinda Elk   PCP: [name], NP   Recommendations at discharge:   Patient to follow-up with his new primary care provider at the [facility] health primary care [facility] clinic on 11/8 at 2 PM. Patient is been provided appropriate diabetic care supplies and insulin FlexPen's.   Patient has been advised to return to the emergency department if he develops worsening weakness, inability to tolerate oral intake, or unrelenting hyper or hypoglycemia.   Discharge Diagnoses: Principal Problem:   Hyperosmolar hyperglycemic state (HHS) (HCC) Active Problems:   Acute metabolic encephalopathy   AKI (acute kidney injury) (HCC)   HTN (hypertension)   Pseudohyponatremia  Resolved Problems:   * No resolved hospital problems. *   Hospital Course: 37 year old homeless male with past medical history of diabetes mellitus type 2, polysubstance abuse who presented to [facility] emergency department on 10/15 with somnolence and severe hyperglycemia.  Patient reported having run out of his outpatient insulin.  Upon evaluation in the emergency department patient was found to be in hyperosmolar nonketotic state.  Patient was initiated on intravenous fluids and insulin infusion and admitted to the stepdown unit under the hospitalist service.  Patient's hyperglycemia and volume depletion quickly improved.  Patient was able to quickly be transitioned off of the insulin infusion.  Patient strength returned and patient tolerated oral intake without issue.  TOC consultation was obtained and arrangements were made for the patient to receive insulin FlexPen's via the match program.  Arrangements were also made for the patient to follow-up with a new primary care provider.  Patient was discharged home in improved and stable condition on  10/16.    Pain control - [employer] Controlled Substance Reporting System database was reviewed. and patient was instructed, not to drive, operate heavy machinery, perform activities at heights, swimming or participation in water activities or provide baby-sitting services while on Pain, Sleep and Anxiety Medications; until their outpatient Physician has advised to do so again. Also recommended to not to take more than prescribed Pain, Sleep and Anxiety Medications.   Consultants: None Procedures performed: None  Disposition: Home Diet recommendation:  Discharge Diet Orders (From admission, onward)     Start     Ordered   05/14/23 0000  Diet Carb Modified        05/14/23 1150           Carb modified diet  DISCHARGE MEDICATION: Allergies as of 05/14/2023   No Known Allergies      Medication List     STOP taking these medications    insulin lispro 100 UNIT/ML KwikPen Commonly known as: HUMALOG   Lantus SoloStar 100 UNIT/ML Solostar Pen Generic drug: insulin glargine   NICODERM CQ TD   TYLENOL 500 MG tablet Generic drug: acetaminophen       TAKE these medications    Accu-Chek Guide test strip Generic drug: glucose blood Use test strip to check blood sugar three times daily What changed:  how much to take how to take this when to take this additional instructions   Accu-Chek Guide w/Device Kit Use to check blood sugar three times daily   Accu-Chek Softclix Lancets lancets Use to check blood sugar three times daily What changed: when to take this   B-D UF III MINI PEN NEEDLES 31G X 5 MM Misc Generic  drug: Insulin Pen Needle Use  three times daily What changed:  medication strength when to take this   Lancet Device Misc 1 each by Does not apply route 3 (three) times daily. May dispense any manufacturer covered by patient's insurance.   lisinopril 20 MG tablet Commonly known as: ZESTRIL Take 1 tablet (20 mg total) by mouth daily. What  changed:  medication strength how much to take   NovoLOG FlexPen 100 UNIT/ML FlexPen Generic drug: insulin aspart Inject 8 Units into the skin 3 (three) times daily with meals. Only take if eating a meal AND Blood Glucose (BG) is 80 or higher.   Semglee (yfgn) 100 UNIT/ML Pen Generic drug: insulin glargine-yfgn Inject 25 Units into the skin at bedtime.        Follow-up Information     [facility]. Go on 06/06/2023.   Why: 2pm Contact information: [address] [place] [address] [phone].                Discharge Exam: Filed Weights   05/13/23 0010 05/13/23 0012  Weight: 97.9 kg 104.3 kg    Constitutional: Awake alert and oriented x3, no associated distress.   Respiratory: clear to auscultation bilaterally, no wheezing, no crackles. Normal respiratory effort. No accessory muscle use.  Cardiovascular: Regular rate and rhythm, no murmurs / rubs / gallops. No extremity edema. 2+ pedal pulses. No carotid bruits.  Abdomen: Abdomen is soft and nontender.  No evidence of intra-abdominal masses.  Positive bowel sounds noted in all quadrants.   Musculoskeletal: No joint deformity upper and lower extremities. Good ROM, no contractures. Normal muscle tone.     Condition at discharge: fair  The results of significant diagnostics from this hospitalization (including imaging, microbiology, ancillary and laboratory) are listed below for reference.   Imaging Studies: DG Chest 2 View  Result Date: 05/13/2023 CLINICAL DATA:  Hyperglycemia with cough and chest pain. EXAM: CHEST - 2 VIEW COMPARISON:  May 07, 2023 FINDINGS: The heart size and mediastinal contours are within normal limits. Both lungs are clear. The visualized skeletal structures are unremarkable. IMPRESSION: No active cardiopulmonary disease. Electronically Signed   By: Aram Candela M.D.   On: 05/13/2023 03:33   CT Head Wo Contrast  Result Date: 05/07/2023 CLINICAL  DATA:  37 year old male with weakness and dizziness, altered mental status. EXAM: CT HEAD WITHOUT CONTRAST TECHNIQUE: Contiguous axial images were obtained from the base of the skull through the vertex without intravenous contrast. RADIATION DOSE REDUCTION: This exam was performed according to the departmental dose-optimization program which includes automated exposure control, adjustment of the mA and/or kV according to patient size and/or use of iterative reconstruction technique. COMPARISON:  Head CT 04/30/2023 and earlier. FINDINGS: Brain: Cavum septum pellucidum, normal variant. Cerebral volume is stable and within normal limits. No midline shift, ventriculomegaly, mass effect, evidence of mass lesion, intracranial hemorrhage or evidence of cortically based acute infarction. Gray-white matter differentiation is within normal limits throughout the brain. Vascular: No suspicious intracranial vascular hyperdensity. Skull: Intact, negative. Sinuses/Orbits: Visualized paranasal sinuses and mastoids are clear. Tympanic cavities appear clear. Other: Disconjugate gaze but similar to prior. Stable scalp soft tissues. IMPRESSION: Stable and negative noncontrast CT appearance of the brain. Electronically Signed   By: Odessa Fleming M.D.   On: 05/07/2023 07:12   DG Chest Port 1 View  Result Date: 05/07/2023 CLINICAL DATA:  37 year old male with weakness and dizziness. Altered mental status. EXAM: PORTABLE CHEST 1 VIEW COMPARISON:  Portable chest 04/12/2023 and earlier. FINDINGS: Portable AP semi upright view at 0621 hours. Mildly larger, improved lung volumes. Mildly rotated to the left today. Normal cardiac size and mediastinal contours. Visualized tracheal air column is within normal limits. Allowing for portable technique the lungs are clear. No pneumothorax or pleural effusion. No osseous abnormality identified. Negative visible bowel gas. IMPRESSION: Negative portable chest. Electronically Signed   By: Odessa Fleming M.D.   On:  05/07/2023 06:33   CT HEAD WO CONTRAST ( )  Result Date: 04/30/2023 CLINICAL DATA:  Headache. EXAM: CT HEAD WITHOUT CONTRAST TECHNIQUE: Contiguous axial images were obtained from the base of the skull through the vertex without intravenous contrast. RADIATION DOSE REDUCTION: This exam was performed according to the departmental dose-optimization program which includes automated exposure control, adjustment of the mA and/or kV according to patient size and/or use of iterative reconstruction technique. COMPARISON:  04/20/2023 FINDINGS: Brain: There is no evidence for acute hemorrhage, hydrocephalus, mass lesion, or abnormal extra-axial fluid collection. No definite CT evidence for acute infarction. Old lacunar infarcts are seen in the basal ganglia bilaterally. Vascular: No hyperdense vessel or unexpected calcification. Skull: No evidence for fracture. No worrisome lytic or sclerotic lesion. Sinuses/Orbits: The visualized paranasal sinuses and mastoid air cells are clear. Visualized portions of the globes and intraorbital fat are unremarkable. Other: None. IMPRESSION: 1. Stable.  No acute intracranial abnormality. Electronically Signed   By: Kennith Center M.D.   On: 04/30/2023 06:54   CT Head Wo Contrast  Result Date: 04/20/2023 CLINICAL DATA:  Hypertension, unresponsive, altered mental status EXAM: CT HEAD WITHOUT CONTRAST TECHNIQUE: Contiguous axial images were obtained from the base of the skull through the vertex without intravenous contrast. RADIATION DOSE REDUCTION: This exam was performed according to the departmental dose-optimization program which includes automated exposure control, adjustment of the mA and/or kV according to patient size and/or use of iterative reconstruction technique. COMPARISON:  03/27/2023 FINDINGS: Brain: No evidence of acute infarction, hemorrhage, mass, mass effect, or midline shift. No hydrocephalus or extra-axial fluid collection. Vascular: No hyperdense vessel. Skull:  Negative for fracture or focal lesion. Sinuses/Orbits: No acute finding.  Dysconjugate gaze. Other: The mastoid air cells are well aerated. IMPRESSION: No acute intracranial process. Electronically Signed   By: Wiliam Ke M.D.   On: 04/20/2023 02:45    Microbiology: Results for orders placed or performed during the hospital encounter of 05/13/23  Resp panel by RT-PCR (RSV, Flu A&B, Covid) Anterior Nasal Swab     Status: None   Collection Time: 05/13/23 12:28 AM   Specimen: Anterior Nasal Swab  Result Value Ref Range Status   SARS Coronavirus 2 by RT PCR NEGATIVE NEGATIVE Final    Comment: (NOTE) SARS-CoV-2 target nucleic acids are NOT DETECTED.  The SARS-CoV-2 RNA is generally detectable in upper respiratory specimens during the acute phase of infection. The lowest concentration of SARS-CoV-2 viral copies this assay can detect is 138 copies/mL. A negative result does not preclude SARS-Cov-2 infection and should not be used as the sole basis for treatment or other patient management decisions. A negative result may occur with  improper specimen collection/handling, submission of specimen other than nasopharyngeal swab, presence of viral mutation(s) within the areas targeted by this assay, and inadequate number of viral copies(<138 copies/mL). A negative result must be combined with clinical observations, patient history, and epidemiological information. The expected result is Negative.  Fact Sheet for Patients:  BloggerCourse.com  Fact Sheet for Healthcare Providers:  SeriousBroker.it  This test is no  t yet approved or cleared by the [place] and  has been authorized for detection and/or diagnosis of SARS-CoV-2 by FDA under an Emergency Use Authorization (EUA). This EUA will remain  in effect (meaning this test can be used) for the duration of the COVID-19 declaration under Section 564(b)(1) of the Act, 21 U.S.C.section  360bbb-3(b)(1), unless the authorization is terminated  or revoked sooner.       Influenza A by PCR NEGATIVE NEGATIVE Final   Influenza B by PCR NEGATIVE NEGATIVE Final    Comment: (NOTE) The Xpert Xpress SARS-CoV-2/FLU/RSV plus assay is intended as an aid in the diagnosis of influenza from Nasopharyngeal swab specimens and should not be used as a sole basis for treatment. Nasal washings and aspirates are unacceptable for Xpert Xpress SARS-CoV-2/FLU/RSV testing.  Fact Sheet for Patients: BloggerCourse.com  Fact Sheet for Healthcare Providers: SeriousBroker.it  This test is not yet approved or cleared by the [place] FDA and has been authorized for detection and/or diagnosis of SARS-CoV-2 by FDA under an Emergency Use Authorization (EUA). This EUA will remain in effect (meaning this test can be used) for the duration of the COVID-19 declaration under Section 564(b)(1) of the Act, 21 U.S.C. section 360bbb-3(b)(1), unless the authorization is terminated or revoked.     Resp Syncytial Virus by PCR NEGATIVE NEGATIVE Final    Comment: (NOTE) Fact Sheet for Patients: BloggerCourse.com  Fact Sheet for Healthcare Providers: SeriousBroker.it  This test is not yet approved or cleared by the [place] FDA and has been authorized for detection and/or diagnosis of SARS-CoV-2 by FDA under an Emergency Use Authorization (EUA). This EUA will remain in effect (meaning this test can be used) for the duration of the COVID-19 declaration under Section 564(b)(1) of the Act, 21 U.S.C. section 360bbb-3(b)(1), unless the authorization is terminated or revoked.  Performed at [facility], [address], [place], [place] [address]   MRSA Next Gen by PCR, Nasal     Status: Abnormal   Collection Time: 05/13/23  4:01 AM   Specimen: Nasal Mucosa; Nasal Swab  Result Value  Ref Range Status   MRSA by PCR Next Gen DETECTED (A) NOT DETECTED Final    Comment: (NOTE) The GeneXpert MRSA Assay (FDA approved for NASAL specimens only), is one component of a comprehensive MRSA colonization surveillance program. It is not intended to diagnose MRSA infection nor to guide or monitor treatment for MRSA infections. Test performance is not FDA approved in patients less than 27 years old. Performed at [facility], [address], [place], [place] [address]     Labs: CBC: Recent Labs  Lab 05/08/23 0412 05/13/23 0056  WBC 6.4 6.4  HGB 15.7 17.1*  HCT 46.0 50.9  MCV 82.7 84.6  PLT 194 196   Basic Metabolic Panel: Recent Labs  Lab 05/13/23 0112 05/13/23 0812 05/13/23 0815 05/13/23 1256 05/13/23 2031 05/14/23 0308  NA 119* 134*  --  132* 127* 135  K 5.4* 3.5  --  3.5 4.0 3.8  CL 83* 98  --  96* 93* 100  CO2 23 25  --  24 25 26   GLUCOSE 920* 149*  --  217* 489* 170*  BUN 26* 20  --  17 21* 20  CREATININE 1.35* 0.99  --  0.87 1.19 0.98  CALCIUM 9.7 9.7  --  9.2 8.5* 8.9  MG  --   --  2.1  --   --   --   PHOS  --   --  4.0  --   --   --    Liver Function Tests: No results for input(s): "AST", "ALT", "ALKPHOS", "BILITOT", "PROT", "ALBUMIN" in the last 168 hours. CBG: Recent Labs  Lab 05/14/23 0034 05/14/23 0135 05/14/23 0325 05/14/23 0744 05/14/23 1118  GLUCAP 160* 162* 174* 304* 285*    Discharge time spent: greater than 30 minutes.  Signed: Marinda Elk, MD [facility] 05/14/2023

## 2023-05-14 NOTE — Discharge Instructions (Signed)
MATCH Medication Assistance Card *Pharmacies please call [phone] for claim processing assistance Name: Reford Olliff                                                                                                                                                                                                                               Relationship Code:  1 ID (MRN): [id number]                                                                                                                                                                                                                          Person Code:  01 Bin: [id number] RX Group: [id number] Discharge Date: 05/14/2023                                 RX PCN:  [id number] Expiration Date:05/20/23                                                  (must be filled within 7 days of discharge)         You have been approved to have the prescriptions written by your discharging physician filled through our [facility] (Medication Assistance Through [facility]) program. This  program allows for a one-time (no refills) 34-day supply of selected medications for a low copay amount.   The copay is $0.00 per prescription. For instance, if you have one prescription, you will pay $0.00; for two prescriptions, you pay $0.00; for three prescriptions, you pay $0.00; and so on.   Only certain pharmacies are participating in this program with [facility]. You will need to select one of the pharmacies from the attached list and take your prescriptions, this letter, and your photo ID to one of the [facility] Outpatient pharmacies.    We are excited that you are able to use the [facility] program to get your medications. These prescriptions must be filled within 7 days of hospital discharge or they will no longer be valid for the [facility] program. Should you have any problems with your prescriptions please contact your case management team member at [phone] for [name]  Chester Long/Arab/ [facility].   Thank you,     [facility] Health Care Management

## 2023-05-14 NOTE — Progress Notes (Signed)
Overnight   NAME: [name] [name] MRN: [id number] DOB : 01-29-1986    Date of Service   05/14/2023   HPI/Events of Note    Notified by RN for CBG goal reached  Attempt to transition off was unsuccessful today   Resumed Insulin and Endo-tool/ NPO period  Currently   Latest Reference Range & Units 05/13/23 17:18 05/13/23 18:17 05/13/23 19:12 05/13/23 19:52 05/13/23 20:19 05/13/23 20:53 05/13/23 21:26 05/13/23 22:37 05/13/23 23:30 05/14/23 00:34  Glucose-Capillary 70 - 99 mg/dL 409 (H) 811 (H) 914 (H) 501 (HH) 560 (HH) 414 (H) 322 (H) 270 (H) 226 (H) 160 (H)  (HH): Data is critically high (H): Data is abnormally high     Latest Reference Range & Units 05/13/23 20:31  BASIC METABOLIC PANEL  Rpt !  Sodium 135 - 145 mmol/L 127 (L)  Potassium 3.5 - 5.1 mmol/L 4.0  Chloride 98 - 111 mmol/L 93 (L)  CO2 22 - 32 mmol/L 25  Glucose 70 - 99 mg/dL 782 (H)  BUN 6 - 20 mg/dL 21 (H)  Creatinine 9.56 - 1.24 mg/dL 2.13  Calcium 8.9 - 08.6 mg/dL 8.5 (L)  Anion gap 5 - 15  9  GFR, Estimated >60 mL/min >60  !: Data is abnormal (L): Data is abnormally low (H): Data is abnormally high Rpt: View report in Results Review for more information    Latest Reference Range & Units 05/13/23 20:31  Beta-Hydroxybutyric Acid 0.05 - 0.27 mmol/L 0.11  Glucose 70 - 99 mg/dL 578 (H)  (H): Data is abnormally high   Transition is in progress.    Interventions/ Plan   Transition now  Stop Insulin Stop IV fluid Resume CBGs      Chinita Greenland BSN MSNA MSN ACNPC-AG Acute Care Nurse Practitioner [facility]

## 2023-05-14 NOTE — TOC Transition Note (Addendum)
Transition of Care [facility]) - CM/SW Discharge Note   Patient Details  Name: [name] [name] MRN: [id number] Date of Birth: [birth year]  [facility] CM/SW Contact:  Darleene Cleaver, LCSW Phone Number: 05/14/2023, 12:29 PM   Clinical Narrative:     CSW attempted to speak to patient, however he was sleeping.  CSW to provide bedside nurse with bus pass to give patient once he is ready to leave.  Patient was staying at a hotel, and plans to return.  CSW provided homeless shelters, and substance abuse resources for patient.    CSW was informed patient does not have a PCP, appointment made for 11.8.24, [facility] at [facility], [address], [place] [address], [phone] at 2pm, arrival at 1:45pm.    Patient was eligible for medication assistance without copays.  Prescriptions and medication assistance provided and sent to [facility] community pharmacy.    CSW asked [facility] community pharmacy to see if they can bring medications to hospital prior to patient discharging.  12:50pm Message received from [place] at [facility], she can bring medications over for patient around 3pm.  Bedside nurse aware.  SDOHs addressed resources added to AVS.  Final next level of care: Other (comment) Barriers to Discharge: Barriers Resolved   Patient Goals and CMS Choice      Discharge Placement  Patient to discharge back to hotel via bus.               Discharge Plan and Services Additional resources added to the After Visit Summary for                                       Social Determinants of Health (SDOH) Interventions SDOH Screenings   Food Insecurity: Food Insecurity Present (05/13/2023)  Housing: Medium Risk (05/13/2023)  Transportation Needs: Unmet Transportation Needs (05/13/2023)  Utilities: At Risk (05/13/2023)  Tobacco Use: Low Risk  (05/13/2023)     Readmission Risk Interventions     No data to display

## 2023-05-14 NOTE — Plan of Care (Signed)

## 2023-05-14 NOTE — Plan of Care (Signed)
Patient discharged with medications. States understands instructions

## 2023-05-14 NOTE — Progress Notes (Signed)
MATCH Medication Assistance Card *Pharmacies please call [phone] for claim processing assistance Name: Reford Olliff                                                                                                                                                                                                                               Relationship Code:  1 ID (MRN): [id number]                                                                                                                                                                                                                          Person Code:  01 Bin: [id number] RX Group: [id number] Discharge Date: 05/14/2023                                 RX PCN:  [id number] Expiration Date:05/20/23                                                  (must be filled within 7 days of discharge)         You have been approved to have the prescriptions written by your discharging physician filled through our [facility] (Medication Assistance Through [facility]) program. This  program allows for a one-time (no refills) 34-day supply of selected medications for a low copay amount.   The copay is $0.00 per prescription. For instance, if you have one prescription, you will pay $0.00; for two prescriptions, you pay $0.00; for three prescriptions, you pay $0.00; and so on.   Only certain pharmacies are participating in this program with [facility]. You will need to select one of the pharmacies from the attached list and take your prescriptions, this letter, and your photo ID to one of the [facility] Outpatient pharmacies.    We are excited that you are able to use the [facility] program to get your medications. These prescriptions must be filled within 7 days of hospital discharge or they will no longer be valid for the [facility] program. Should you have any problems with your prescriptions please contact your case management team member at [phone] for [name]  Chester Long/[facility]/ [facility].   Thank you,     [facility] Health Care Management

## 2023-05-15 ENCOUNTER — Other Ambulatory Visit (HOSPITAL_COMMUNITY): Payer: Self-pay

## 2023-05-19 ENCOUNTER — Encounter (HOSPITAL_COMMUNITY): Payer: Self-pay

## 2023-05-19 ENCOUNTER — Other Ambulatory Visit: Payer: Self-pay

## 2023-05-19 ENCOUNTER — Emergency Department (HOSPITAL_COMMUNITY): Payer: Self-pay

## 2023-05-19 ENCOUNTER — Emergency Department (HOSPITAL_COMMUNITY)
Admission: EM | Admit: 2023-05-19 | Discharge: 2023-05-20 | Disposition: A | Discharge status: Home / Self Care | Payer: Self-pay | Attending: Emergency Medicine | Admitting: Emergency Medicine

## 2023-05-19 DIAGNOSIS — M79672 Pain in left foot: Secondary | ICD-10-CM | POA: Insufficient documentation

## 2023-05-19 DIAGNOSIS — L853 Xerosis cutis: Secondary | ICD-10-CM | POA: Insufficient documentation

## 2023-05-19 DIAGNOSIS — M79671 Pain in right foot: Secondary | ICD-10-CM | POA: Insufficient documentation

## 2023-05-19 DIAGNOSIS — R079 Chest pain, unspecified: Secondary | ICD-10-CM | POA: Insufficient documentation

## 2023-05-19 LAB — COMPREHENSIVE METABOLIC PANEL
ALT: 15 U/L (ref 0–44)
AST: 23 U/L (ref 15–41)
Albumin: 3.7 g/dL (ref 3.5–5.0)
Alkaline Phosphatase: 68 U/L (ref 38–126)
Anion gap: 9 (ref 5–15)
BUN: 20 mg/dL (ref 6–20)
CO2: 25 mmol/L (ref 22–32)
Calcium: 8.9 mg/dL (ref 8.9–10.3)
Chloride: 104 mmol/L (ref 98–111)
Creatinine, Ser: 1.29 mg/dL — ABNORMAL HIGH (ref 0.61–1.24)
GFR, Estimated: >60 mL/min (ref >60)
Glucose, Bld: 104 mg/dL — ABNORMAL HIGH (ref 70–99)
Potassium: 4 mmol/L (ref 3.5–5.1)
Sodium: 138 mmol/L (ref 135–145)
Total Bilirubin: 0.4 mg/dL (ref 0.3–1.2)
Total Protein: 7.2 g/dL (ref 6.5–8.1)

## 2023-05-19 LAB — CBC WITH DIFFERENTIAL/PLATELET
Abs Immature Granulocytes: 0.02 10*3/uL (ref 0.00–0.07)
Basophils Absolute: 0 10*3/uL (ref 0.0–0.1)
Basophils Relative: 1 %
Eosinophils Absolute: 0.1 10*3/uL (ref 0.0–0.5)
Eosinophils Relative: 2 %
HCT: 45.7 % (ref 39.0–52.0)
Hemoglobin: 15.2 g/dL (ref 13.0–17.0)
Immature Granulocytes: 0 %
Lymphocytes Relative: 42 %
Lymphs Abs: 2.9 10*3/uL (ref 0.7–4.0)
MCH: 28.3 pg (ref 26.0–34.0)
MCHC: 33.3 g/dL (ref 30.0–36.0)
MCV: 85.1 fL (ref 80.0–100.0)
Monocytes Absolute: 0.6 10*3/uL (ref 0.1–1.0)
Monocytes Relative: 8 %
Neutro Abs: 3.3 10*3/uL (ref 1.7–7.7)
Neutrophils Relative %: 47 %
Platelets: 321 10*3/uL (ref 150–400)
RBC: 5.37 MIL/uL (ref 4.22–5.81)
RDW: 12.5 % (ref 11.5–15.5)
WBC: 7 10*3/uL (ref 4.0–10.5)
nRBC: 0 % (ref 0.0–0.2)

## 2023-05-19 LAB — CBG MONITORING, ED
Glucose-Capillary: 110 mg/dL — ABNORMAL HIGH (ref 70–99)
Glucose-Capillary: 147 mg/dL — ABNORMAL HIGH (ref 70–99)
Glucose-Capillary: 159 mg/dL — ABNORMAL HIGH (ref 70–99)

## 2023-05-19 LAB — BETA-HYDROXYBUTYRIC ACID: Beta-Hydroxybutyric Acid: 0.13 mmol/L (ref 0.05–0.27)

## 2023-05-19 NOTE — ED Triage Notes (Signed)
Pt to ED by EMS from [facility] with c/o nontraumatic bilateral foot pain which began today. Pt denies any injuries. Arrives A+O, VSS, NADN.

## 2023-05-19 NOTE — ED Provider Triage Note (Signed)
Emergency Medicine Provider Triage Evaluation Note  KUE FAMBROUGH , a 37 y.o. male  was evaluated in triage.  Pt complains of foot pain.  Review of Systems  Positive:  Negative:   Physical Exam  There were no vitals taken for this visit. Gen:   Awake, no distress   Resp:  Normal effort  MSK:   Moves extremities without difficulty  Other:    Medical Decision Making  Medically screening exam initiated at 8:59 PM.  Appropriate orders placed.  ARMEN LUST was informed that the remainder of the evaluation will be completed by another provider, this initial triage assessment does not replace that evaluation, and the importance of remaining in the ED until their evaluation is complete.  Patient is sleeping heavily. Awakes with sternal rub but immediately falls back to sleep. Denies ETOH or other drug use. It appears he has hx of DKA and HHS, but CBG is currently 147 and patient is only taking insulin for DM. Patient is able to tell me that he has had BL foot pain for a few days d/t walking a lot. Denies any other specific trauma.    Dorthy Cooler, [place] 05/19/23 2106

## 2023-05-20 ENCOUNTER — Other Ambulatory Visit (HOSPITAL_COMMUNITY): Payer: Self-pay

## 2023-05-20 ENCOUNTER — Emergency Department (HOSPITAL_COMMUNITY): Payer: Self-pay

## 2023-05-20 LAB — TROPONIN I (HIGH SENSITIVITY)
Troponin I (High Sensitivity): 4 ng/L (ref <18)
Troponin I (High Sensitivity): 4 ng/L (ref <18)

## 2023-05-20 LAB — D-DIMER, QUANTITATIVE: D-Dimer, Quant: 0.33 ug{FEU}/mL (ref 0.00–0.50)

## 2023-05-20 MED ORDER — EUCERIN ORIGINAL HEALING EX CREA
1.0000 | TOPICAL_CREAM | CUTANEOUS | 0 refills | Status: DC | PRN
  Filled 2023-05-20: qty 454, 30d supply, fill #0

## 2023-05-20 NOTE — ED Provider Notes (Signed)
[facility]-EMERGENCY DEPT [facility] Emergency Department Provider Note MRN:  [id number]  Arrival date & time: 05/20/23     Chief Complaint   Foot Pain   History of Present Illness   [name] [name] is a 37 y.o. year-old male presents to the ED with chief complaint of foot pain and chest pain.  States that the chest pain as been there a while.  Still present now.  Hx of DM.  States his blood sugar was running high as well.  Seen frequently for hyper and hypoglycemia.  Denies any trauma.  States he has been using his insulin as directed.  History provided by patient.   Review of Systems  Pertinent positive and negative review of systems noted in HPI.    Physical Exam   Vitals:   05/20/23 0000 05/20/23 0230  BP: 125/77 113/80  Pulse: 100 (!) 107  Resp: 16 17  Temp: 97.9 F (36.6 C)   SpO2: 99% 97%    CONSTITUTIONAL:  non toxic-appearing, NAD NEURO:  Alert and oriented x 3, CN 3-12 grossly intact EYES:  eyes equal and reactive ENT/NECK:  Supple, no stridor  CARDIO:  mildly tachycardic, regular rhythm, appears well-perfused  PULM:  No respiratory distress, CTAB GI/GU:  non-distended,  MSK/SPINE:  No gross deformities, no edema, moves all extremities  SKIN:  dry, cracked skin on feet   *Additional and/or pertinent findings included in MDM below  Diagnostic and Interventional Summary    EKG Interpretation Date/Time:  Tuesday May 20 2023 00:13:01 EDT Ventricular Rate:  106 PR Interval:  128 QRS Duration:  103 QT Interval:  341 QTC Calculation: 453 R Axis:   -4  Text Interpretation: Sinus tachycardia Incomplete RBBB and LAFB Consider anterior infarct No STEMI Confirmed by Ernie Avena ([id number]) on 05/20/2023 1:54:11 AM       Labs Reviewed  COMPREHENSIVE METABOLIC PANEL - Abnormal; Notable for the following components:      Result Value   Glucose, Bld 104 (*)    Creatinine, Ser 1.29 (*)    All other components within normal limits  CBG MONITORING, ED -  Abnormal; Notable for the following components:   Glucose-Capillary 147 (*)    All other components within normal limits  CBG MONITORING, ED - Abnormal; Notable for the following components:   Glucose-Capillary 110 (*)    All other components within normal limits  CBG MONITORING, ED - Abnormal; Notable for the following components:   Glucose-Capillary 159 (*)    All other components within normal limits  CBC WITH DIFFERENTIAL/PLATELET  BETA-HYDROXYBUTYRIC ACID  D-DIMER, QUANTITATIVE  TROPONIN I (HIGH SENSITIVITY)  TROPONIN I (HIGH SENSITIVITY)    DG Chest 2 View  Final Result    DG Foot Complete Left  Final Result    DG Foot Complete Right  Final Result      Medications - No data to display   Procedures  /  Critical Care Procedures  ED Course and Medical Decision Making  I have reviewed the triage vital signs, the nursing notes, and pertinent available records from the EMR.  Social Determinants Affecting Complexity of Care: Patient has no clinically significant social determinants affecting this chief complaint..   ED Course: Clinical Course as of 05/20/23 0344  Tue May 20, 2023  0342 Troponin I (High Sensitivity) Trops are negative and flat at 4 x2.  [RB]  [id number] D-dimer, quantitative Dimer is negative, doubt PE [RB]  0343 CBG monitoring, ED(!) CBG is pretty good compared to other visits [  RB]  0343 Anion gap: 9 Normal anion gap, doubt DKA [RB]  0343 DG Chest 2 View No opacity seen [RB]  0344 DG Foot Complete Right No fracture or dislocation seen [RB]  0344 DG Foot Complete Left No fracture or dislocation seen [RB]    Clinical Course User Index [RB] Roxy Horseman, PA-C    Medical Decision Making Patient here for bilateral foot pain.  Has dry and cracked skin that I think is the cause of his symptoms.  Rx for Eucerin.  Imaging ordered in triage is negative.  Chest pain not thought to be ACS.  Trops are negative.  D-dimer is negative.  No acute ischemic  changes on EKG.  Feel that patient can be safely discharged and follow-up with PCP.  Amount and/or Complexity of Data Reviewed Labs: ordered. Decision-making details documented in ED Course. Radiology: ordered and independent interpretation performed. Decision-making details documented in ED Course.  Risk OTC drugs.         Consultants: No consultations were needed in caring for this patient.   Treatment and Plan: I considered admission due to patient's initial presentation, but after considering the examination and diagnostic results, patient will not require admission and can be discharged with outpatient follow-up.    Final Clinical Impressions(s) / ED Diagnoses     ICD-10-CM   1. Chest pain, unspecified type  R07.9     2. Foot pain, left  M79.672     3. Foot pain, right  M79.671     4. Dry skin  L85.3       ED Discharge Orders          Ordered    Skin Protectants, Misc. (EUCERIN) cream  As needed        05/20/23 8119              Discharge Instructions Discussed with and Provided to Patient:   Discharge Instructions      Please take your medications as directed.  I recommend using the foot cream on the dry and cracking skin.  I've sent a prescription to your pharmacy.  Your tests tonight looked good.      Roxy Horseman, PA-C 05/20/23 1478    Ernie Avena, MD 05/20/23 [phone]

## 2023-05-20 NOTE — Discharge Instructions (Signed)
Please take your medications as directed.  I recommend using the foot cream on the dry and cracking skin.  I've sent a prescription to your pharmacy.  Your tests tonight looked good.

## 2023-05-21 NOTE — Plan of Care (Signed)
[facility] Tonsillectomy/Adenoidectomy, Postoperative PEDS care plan entered in error.

## 2023-06-02 ENCOUNTER — Other Ambulatory Visit (HOSPITAL_COMMUNITY): Payer: Self-pay

## 2023-06-06 ENCOUNTER — Encounter: Payer: Self-pay | Admitting: Family

## 2023-06-06 NOTE — Progress Notes (Signed)
Erroneous encounter-disregard

## 2023-06-26 DIAGNOSIS — Z794 Long term (current) use of insulin: Secondary | ICD-10-CM | POA: Insufficient documentation

## 2023-06-26 DIAGNOSIS — R112 Nausea with vomiting, unspecified: Principal | ICD-10-CM | POA: Insufficient documentation

## 2023-06-26 DIAGNOSIS — I10 Essential (primary) hypertension: Secondary | ICD-10-CM | POA: Insufficient documentation

## 2023-06-26 DIAGNOSIS — E119 Type 2 diabetes mellitus without complications: Secondary | ICD-10-CM | POA: Diagnosis not present

## 2023-06-26 DIAGNOSIS — Z79899 Other long term (current) drug therapy: Secondary | ICD-10-CM | POA: Insufficient documentation

## 2023-06-27 ENCOUNTER — Other Ambulatory Visit: Payer: Self-pay

## 2023-06-27 ENCOUNTER — Observation Stay (HOSPITAL_COMMUNITY)
Admission: EM | Admit: 2023-06-27 | Discharge: 2023-06-29 | Discharge status: Against Medical Advice | Payer: Self-pay | Attending: Internal Medicine | Admitting: Internal Medicine

## 2023-06-27 ENCOUNTER — Encounter (HOSPITAL_COMMUNITY): Payer: Self-pay

## 2023-06-27 DIAGNOSIS — R739 Hyperglycemia, unspecified: Principal | ICD-10-CM

## 2023-06-27 DIAGNOSIS — E1100 Type 2 diabetes mellitus with hyperosmolarity without nonketotic hyperglycemic-hyperosmolar coma (NKHHC): Secondary | ICD-10-CM | POA: Diagnosis present

## 2023-06-27 DIAGNOSIS — Z91199 Patient's noncompliance with other medical treatment and regimen due to unspecified reason: Secondary | ICD-10-CM

## 2023-06-27 LAB — I-STAT CHEM 8, ED
BUN: 24 mg/dL — ABNORMAL HIGH (ref 6–20)
Calcium, Ion: 1.25 mmol/L (ref 1.15–1.40)
Chloride: 90 mmol/L — ABNORMAL LOW (ref 98–111)
Creatinine, Ser: 1.3 mg/dL — ABNORMAL HIGH (ref 0.61–1.24)
Glucose, Bld: >700 mg/dL (ref 70–99)
HCT: 49 % (ref 39.0–52.0)
Hemoglobin: 16.7 g/dL (ref 13.0–17.0)
Potassium: 4.9 mmol/L (ref 3.5–5.1)
Sodium: 128 mmol/L — ABNORMAL LOW (ref 135–145)
TCO2: 28 mmol/L (ref 22–32)

## 2023-06-27 LAB — COMPREHENSIVE METABOLIC PANEL
ALT: 17 U/L (ref 0–44)
AST: 12 U/L — ABNORMAL LOW (ref 15–41)
Albumin: 4.2 g/dL (ref 3.5–5.0)
Alkaline Phosphatase: 88 U/L (ref 38–126)
Anion gap: 12 (ref 5–15)
BUN: 22 mg/dL — ABNORMAL HIGH (ref 6–20)
CO2: 25 mmol/L (ref 22–32)
Calcium: 9.8 mg/dL (ref 8.9–10.3)
Chloride: 89 mmol/L — ABNORMAL LOW (ref 98–111)
Creatinine, Ser: 1.47 mg/dL — ABNORMAL HIGH (ref 0.61–1.24)
GFR, Estimated: >60 mL/min (ref >60)
Glucose, Bld: 1086 mg/dL (ref 70–99)
Potassium: 4.7 mmol/L (ref 3.5–5.1)
Sodium: 126 mmol/L — ABNORMAL LOW (ref 135–145)
Total Bilirubin: 0.5 mg/dL (ref <1.2)
Total Protein: 8 g/dL (ref 6.5–8.1)

## 2023-06-27 LAB — URINALYSIS, ROUTINE W REFLEX MICROSCOPIC
Bilirubin Urine: NEGATIVE
Glucose, UA: >=500 mg/dL — AB
Hgb urine dipstick: NEGATIVE
Ketones, ur: NEGATIVE mg/dL
Leukocytes,Ua: NEGATIVE
Nitrite: NEGATIVE
Protein, ur: NEGATIVE mg/dL
Specific Gravity, Urine: 1.024 (ref 1.005–1.030)
pH: 6 (ref 5.0–8.0)

## 2023-06-27 LAB — BASIC METABOLIC PANEL
Anion gap: 11 (ref 5–15)
Anion gap: 10 (ref 5–15)
Anion gap: 7 (ref 5–15)
Anion gap: 8 (ref 5–15)
Anion gap: 10 (ref 5–15)
BUN: 20 mg/dL (ref 6–20)
BUN: 17 mg/dL (ref 6–20)
BUN: 17 mg/dL (ref 6–20)
BUN: 16 mg/dL (ref 6–20)
BUN: 20 mg/dL (ref 6–20)
CO2: 24 mmol/L (ref 22–32)
CO2: 25 mmol/L (ref 22–32)
CO2: 29 mmol/L (ref 22–32)
CO2: 27 mmol/L (ref 22–32)
CO2: 27 mmol/L (ref 22–32)
Calcium: 9.4 mg/dL (ref 8.9–10.3)
Calcium: 10.2 mg/dL (ref 8.9–10.3)
Calcium: 9.2 mg/dL (ref 8.9–10.3)
Calcium: 9.3 mg/dL (ref 8.9–10.3)
Calcium: 9.6 mg/dL (ref 8.9–10.3)
Chloride: 92 mmol/L — ABNORMAL LOW (ref 98–111)
Chloride: 102 mmol/L (ref 98–111)
Chloride: 101 mmol/L (ref 98–111)
Chloride: 100 mmol/L (ref 98–111)
Chloride: 97 mmol/L — ABNORMAL LOW (ref 98–111)
Creatinine, Ser: 1.28 mg/dL — ABNORMAL HIGH (ref 0.61–1.24)
Creatinine, Ser: 1.07 mg/dL (ref 0.61–1.24)
Creatinine, Ser: 0.96 mg/dL (ref 0.61–1.24)
Creatinine, Ser: 0.9 mg/dL (ref 0.61–1.24)
Creatinine, Ser: 1.07 mg/dL (ref 0.61–1.24)
GFR, Estimated: >60 mL/min (ref >60)
GFR, Estimated: >60 mL/min (ref >60)
GFR, Estimated: >60 mL/min (ref >60)
GFR, Estimated: >60 mL/min (ref >60)
GFR, Estimated: >60 mL/min (ref >60)
Glucose, Bld: 1023 mg/dL (ref 70–99)
Glucose, Bld: 386 mg/dL — ABNORMAL HIGH (ref 70–99)
Glucose, Bld: 283 mg/dL — ABNORMAL HIGH (ref 70–99)
Glucose, Bld: 335 mg/dL — ABNORMAL HIGH (ref 70–99)
Glucose, Bld: 337 mg/dL — ABNORMAL HIGH (ref 70–99)
Potassium: 5.1 mmol/L (ref 3.5–5.1)
Potassium: 4.1 mmol/L (ref 3.5–5.1)
Potassium: 3.5 mmol/L (ref 3.5–5.1)
Potassium: 3.8 mmol/L (ref 3.5–5.1)
Potassium: 3.9 mmol/L (ref 3.5–5.1)
Sodium: 127 mmol/L — ABNORMAL LOW (ref 135–145)
Sodium: 137 mmol/L (ref 135–145)
Sodium: 137 mmol/L (ref 135–145)
Sodium: 135 mmol/L (ref 135–145)
Sodium: 134 mmol/L — ABNORMAL LOW (ref 135–145)

## 2023-06-27 LAB — CBC
HCT: 47 % (ref 39.0–52.0)
Hemoglobin: 15.3 g/dL (ref 13.0–17.0)
MCH: 28.6 pg (ref 26.0–34.0)
MCHC: 32.6 g/dL (ref 30.0–36.0)
MCV: 87.9 fL (ref 80.0–100.0)
Platelets: 184 10*3/uL (ref 150–400)
RBC: 5.35 MIL/uL (ref 4.22–5.81)
RDW: 12.5 % (ref 11.5–15.5)
WBC: 5 10*3/uL (ref 4.0–10.5)
nRBC: 0 % (ref 0.0–0.2)

## 2023-06-27 LAB — CBG MONITORING, ED
Glucose-Capillary: 353 mg/dL — ABNORMAL HIGH (ref 70–99)
Glucose-Capillary: 473 mg/dL — ABNORMAL HIGH (ref 70–99)
Glucose-Capillary: >600 mg/dL (ref 70–99)
Glucose-Capillary: >600 mg/dL (ref 70–99)
Glucose-Capillary: >600 mg/dL (ref 70–99)
Glucose-Capillary: >600 mg/dL (ref 70–99)

## 2023-06-27 LAB — GLUCOSE, CAPILLARY
Glucose-Capillary: 295 mg/dL — ABNORMAL HIGH (ref 70–99)
Glucose-Capillary: 214 mg/dL — ABNORMAL HIGH (ref 70–99)
Glucose-Capillary: 183 mg/dL — ABNORMAL HIGH (ref 70–99)
Glucose-Capillary: 180 mg/dL — ABNORMAL HIGH (ref 70–99)
Glucose-Capillary: 239 mg/dL — ABNORMAL HIGH (ref 70–99)
Glucose-Capillary: 363 mg/dL — ABNORMAL HIGH (ref 70–99)
Glucose-Capillary: 324 mg/dL — ABNORMAL HIGH (ref 70–99)
Glucose-Capillary: 386 mg/dL — ABNORMAL HIGH (ref 70–99)

## 2023-06-27 LAB — BETA-HYDROXYBUTYRIC ACID: Beta-Hydroxybutyric Acid: 0.15 mmol/L (ref 0.05–0.27)

## 2023-06-27 LAB — OSMOLALITY: Osmolality: 311 mosm/kg — ABNORMAL HIGH (ref 275–295)

## 2023-06-27 MED ORDER — INSULIN ASPART 100 UNIT/ML IJ SOLN
8.0000 [IU] | Freq: Three times a day (TID) | INTRAMUSCULAR | Status: DC
  Administered 2023-06-27: 8 [IU] via SUBCUTANEOUS

## 2023-06-27 MED ORDER — INSULIN GLARGINE-YFGN 100 UNIT/ML ~~LOC~~ SOLN
30.0000 [IU] | Freq: Every day | SUBCUTANEOUS | Status: DC
  Administered 2023-06-28: 30 [IU] via SUBCUTANEOUS
  Filled 2023-06-27 (×2): qty 0.3

## 2023-06-27 MED ORDER — INSULIN GLARGINE-YFGN 100 UNIT/ML ~~LOC~~ SOLN
10.0000 [IU] | SUBCUTANEOUS | Status: AC
  Administered 2023-06-27: 10 [IU] via SUBCUTANEOUS
  Filled 2023-06-27: qty 0.1

## 2023-06-27 MED ORDER — ENOXAPARIN SODIUM 40 MG/0.4ML IJ SOSY
40.0000 mg | PREFILLED_SYRINGE | INTRAMUSCULAR | Status: DC
  Filled 2023-06-27 (×2): qty 0.4

## 2023-06-27 MED ORDER — ORAL CARE MOUTH RINSE: 15.0000 mL | OROMUCOSAL | Status: DC | PRN

## 2023-06-27 MED ORDER — SODIUM CHLORIDE 0.9 % IV BOLUS
1000.0000 mL | Freq: Once | INTRAVENOUS | Status: AC
  Administered 2023-06-27: 1000 mL via INTRAVENOUS

## 2023-06-27 MED ORDER — DEXTROSE IN LACTATED RINGERS 5 % IV SOLN: INTRAVENOUS | Status: DC

## 2023-06-27 MED ORDER — INSULIN ASPART 100 UNIT/ML IJ SOLN
0.0000 [IU] | Freq: Every day | INTRAMUSCULAR | Status: DC
  Administered 2023-06-27: 5 [IU] via SUBCUTANEOUS

## 2023-06-27 MED ORDER — INSULIN REGULAR(HUMAN) IN NACL 100-0.9 UT/100ML-% IV SOLN
INTRAVENOUS | Status: DC
  Administered 2023-06-27: 10.5 [IU]/h via INTRAVENOUS
  Filled 2023-06-27: qty 100

## 2023-06-27 MED ORDER — DEXTROSE 50 % IV SOLN: 0.0000 mL | INTRAVENOUS | Status: DC | PRN

## 2023-06-27 MED ORDER — LACTATED RINGERS IV SOLN: INTRAVENOUS | Status: DC

## 2023-06-27 MED ORDER — INSULIN REGULAR(HUMAN) IN NACL 100-0.9 UT/100ML-% IV SOLN: INTRAVENOUS | Status: DC

## 2023-06-27 MED ORDER — POTASSIUM CHLORIDE 10 MEQ/100ML IV SOLN
10.0000 meq | INTRAVENOUS | Status: AC
Start: 2023-06-27 — End: 2023-06-27
  Administered 2023-06-27 (×2): 10 meq via INTRAVENOUS
  Filled 2023-06-27: qty 100

## 2023-06-27 MED ORDER — INSULIN ASPART 100 UNIT/ML IJ SOLN
12.0000 [IU] | Freq: Three times a day (TID) | INTRAMUSCULAR | Status: DC
  Administered 2023-06-28 (×3): 12 [IU] via SUBCUTANEOUS

## 2023-06-27 MED ORDER — INSULIN ASPART 100 UNIT/ML IJ SOLN: 4.0000 [IU] | Freq: Three times a day (TID) | INTRAMUSCULAR | Status: DC

## 2023-06-27 MED ORDER — INSULIN ASPART 100 UNIT/ML IJ SOLN
0.0000 [IU] | Freq: Three times a day (TID) | INTRAMUSCULAR | Status: DC
  Administered 2023-06-27: 7 [IU] via SUBCUTANEOUS
  Administered 2023-06-27: 9 [IU] via SUBCUTANEOUS
  Administered 2023-06-28: 7 [IU] via SUBCUTANEOUS
  Administered 2023-06-28: 3 [IU] via SUBCUTANEOUS
  Administered 2023-06-28: 9 [IU] via SUBCUTANEOUS
  Administered 2023-06-29: 7 [IU] via SUBCUTANEOUS

## 2023-06-27 MED ORDER — LACTATED RINGERS IV BOLUS
20.0000 mL/kg | Freq: Once | INTRAVENOUS | Status: AC
  Administered 2023-06-27: 1878 mL via INTRAVENOUS

## 2023-06-27 MED ORDER — ONDANSETRON HCL 4 MG/2ML IJ SOLN
4.0000 mg | Freq: Once | INTRAMUSCULAR | Status: AC
  Administered 2023-06-27: 4 mg via INTRAVENOUS
  Filled 2023-06-27: qty 2

## 2023-06-27 MED ORDER — INSULIN GLARGINE-YFGN 100 UNIT/ML ~~LOC~~ SOLN
20.0000 [IU] | Freq: Every day | SUBCUTANEOUS | Status: DC
Start: 2023-06-27 — End: 2023-06-27
  Administered 2023-06-27: 20 [IU] via SUBCUTANEOUS
  Filled 2023-06-27: qty 0.2

## 2023-06-27 MED ORDER — LISINOPRIL 20 MG PO TABS
20.0000 mg | ORAL_TABLET | Freq: Every day | ORAL | Status: DC
  Administered 2023-06-27 – 2023-06-28 (×2): 20 mg via ORAL
  Filled 2023-06-27: qty 2
  Filled 2023-06-27: qty 1

## 2023-06-27 NOTE — Hospital Course (Addendum)
37 yom w/ PMH significant for uncontrolled diabetes mellitus, hypertension, and substance abuse who presents with nausea and vomiting.  Patient states that he has not taken any insulin in 3 weeks.  He states that he been doing well despite this until yesterday when he developed nausea with nonbloody vomiting some abdominal discomfort  In AV:WUJWJXBJ and not hypoxic,BP on higher side, labs w/ glucose 1086, normal serum bicarbonate, normal anion gap, creatinine 1.47, normal WBC, and normal beta-hydroxybutyrate. He was given 3 L of IV fluids, Zofran, and started on IV insulin infusion And admitted for further management of HHS.  Patient was treated with IV insulin, IV fluids slowly transition to subcu basal bolus regimen seen by diabetes coordinator-meds were further adjusted.  Patient is homeless will needs ToC help with insulin refills His insulin regimen is being adjusted to optimize his blood sugar. Toc CONSULTED to help with resources His blood sugar was still poorly controlled this am.  Early in am, he desired to leave hospital as he was " bored: To RN he stated he will get insulin 70/30 from [employer] and left AMA before I could come and talk to him this am. But he has been warned of risk fo DKA HHS that could be fatal or lead to significant disability

## 2023-06-27 NOTE — Progress Notes (Signed)
Patient seen and examined personally, I reviewed the chart, history and physical and admission note, done by admitting physician this morning and agree with the same with following addendum.  Please refer to the morning admission note for more detailed plan of care.  Briefly,  37 yom w/ PMH significant for uncontrolled diabetes mellitus, hypertension, and substance abuse who presents with nausea and vomiting.   Patient states that he has not taken any insulin in 3 weeks.  He states that he been doing well despite this until yesterday when he developed nausea with nonbloody vomiting some abdominal discomfort  In YQ:MVHQIONG and not hypoxic,BP on higher side, labs w/ glucose 1086, normal serum bicarbonate, normal anion gap, creatinine 1.47, normal WBC, and normal beta-hydroxybutyrate. He was given 3 L of IV fluids, Zofran, and started on IV insulin infusion And admitted for further management of HHS.     On exam:  He is alert awake oriented. Earlier this morning he was threatening to leave AGAINST MEDICAL ADVICE if not given diet He tolerated diet well  A/P HHS Diabetes mellitus with uncontrolled hyperglycemia: A1c 12 in sept 24.  Not taking insulin for 3 weeks.  Managed with IV fluids IV insulin drip> transitioning to Semglee and Premeal insulin.diabetes coordinator consulted currently homeless and will need resources and supplies before discharge. PTA on Semglee 25 units, 8 u premeal. Recent Labs  Lab 06/27/23 0650 06/27/23 0749 06/27/23 0849 06/27/23 0951 06/27/23 1149  GLUCAP 214* 183* 180* 239* 363*    Hypertension: Poorly controlled PTA on lisinopril.resumed.  AKI: Prerenal from HHS improved to 1.0 from 1.3

## 2023-06-27 NOTE — ED Triage Notes (Signed)
Patient arrives via EMS for N/V  that started today. States he vomited 3 times. States he has not been able to take his insulin for 3 weeks because he was in jail and unable to refrigerate his insulin once he got out. EMS reports blood sugar 413.

## 2023-06-27 NOTE — H&P (Signed)
History and Physical    [name] [name]:[id number] DOB: Sep 16, 1985 DOA: 06/27/2023  PCP: [name], NP   Patient coming from: Home   Chief Complaint: N/V   HPI: [name] [name] is a 37 y.o. male with medical history significant for uncontrolled diabetes mellitus, hypertension, and substance abuse who presents with nausea and vomiting.   Patient states that he has not taken any insulin in 3 weeks.  He states that he been doing well despite this until yesterday when he developed nausea with nonbloody vomiting.  He had some abdominal discomfort earlier but denies that now, and also denies chest pain or fever.  ED Course: Upon arrival to the ED, patient is found to be afebrile and saturating well on room air with normal heart rate and stable blood pressure.  Labs are most notable for glucose 1086, normal serum bicarbonate, normal anion gap, creatinine 1.47, normal WBC, and normal beta-hydroxybutyrate.  He was given 3 L of IV fluids, Zofran, and started on IV insulin infusion.   Review of Systems:  All other systems reviewed and apart from HPI, are negative.  Past Medical History:  Diagnosis Date   Diabetes mellitus without complication (HCC)    DKA (diabetic ketoacidosis) (HCC) 05/08/2022   HTN (hypertension) 05/08/2022   Hyperlipidemia    Hyperosmolar hyperglycemic state (HHS) (HCC) 05/08/2022   Hypertension    Hypoglycemia 07/02/2019   Pseudohyponatremia 05/08/2022    Past Surgical History:  Procedure Laterality Date   HAND SURGERY      Social History:   reports that he has never smoked. He has never used smokeless tobacco. He reports current drug use. Drug: Amphetamines. He reports that he does not drink alcohol.  No Known Allergies  Family History  Family history unknown: Yes     Prior to Admission medications   Medication Sig Start Date End Date Taking? Authorizing Provider  blood glucose meter kit and supplies KIT Use to check blood sugar three times  daily 05/14/23   [name], Deno Lunger, MD  Glucose Blood (BLOOD GLUCOSE TEST STRIPS) STRP Use test strip to check blood sugar three times daily 05/14/23   [name], Deno Lunger, MD  insulin aspart (NOVOLOG) 100 UNIT/ML FlexPen Inject 8 Units into the skin 3 (three) times daily with meals. Only take if eating a meal AND Blood Glucose (BG) is 80 or higher. 05/14/23   [name], Deno Lunger, MD  insulin glargine-yfgn (SEMGLEE) 100 UNIT/ML Pen Inject 25 Units into the skin at bedtime. 05/14/23   [name], Deno Lunger, MD  Insulin Pen Needle (PEN NEEDLES) 31G X 5 MM MISC Use  three times daily 05/14/23   [name], Deno Lunger, MD  Lancet Device MISC 1 each by Does not apply route 3 (three) times daily. May dispense any manufacturer covered by patient's insurance. 05/14/23   [name], Deno Lunger, MD  Lancets MISC Use to check blood sugar three times daily 05/14/23   [name], Deno Lunger, MD  lisinopril (ZESTRIL) 20 MG tablet Take 1 tablet (20 mg total) by mouth daily. 05/14/23 06/13/23  [name], Deno Lunger, MD  Skin Protectants, Misc. (EUCERIN) cream Apply 1 Application topically as needed for dry skin. 05/20/23   Roxy Horseman, PA-C    Physical Exam: Vitals:   06/27/23 0004 06/27/23 0015 06/27/23 0130 06/27/23 0142  BP: (!) 185/118 (!) 146/91 (!) 159/112   Pulse: 87 89  93  Resp: 15   13  Temp: 98.6 F (37 C)     TempSrc: Oral  SpO2: 100% 100%  100%  Weight: 93.9 kg     Height: 5\' 11"  (1.803 m)        Constitutional: NAD, no pallor or diaphoresis   Eyes: PERTLA, lids and conjunctivae normal ENMT: Mucous membranes are moist. Posterior pharynx clear of any exudate or lesions.   Neck: supple, no masses  Respiratory: no wheezing, no crackles. No accessory muscle use.  Cardiovascular: S1 & S2 heard, regular rate and rhythm. Hyperdynamic precordium.  Abdomen: No distension, no tenderness, soft. Bowel sounds active.  Musculoskeletal: no clubbing / cyanosis. No joint deformity upper and lower extremities.    Skin: no significant rashes, lesions, ulcers. Warm, dry, well-perfused. Neurologic: CN 2-12 grossly intact. Moving all extremities. Somnolent but responds to questions and cooperates with interview.  Psychiatric: Pleasant. Cooperative.    Labs and Imaging on Admission: I have personally reviewed following labs and imaging studies  CBC: Recent Labs  Lab 06/27/23 0013 06/27/23 0035  WBC 5.0  --   HGB 15.3 16.7  HCT 47.0 49.0  MCV 87.9  --   PLT 184  --    Basic Metabolic Panel: Recent Labs  Lab 06/27/23 0013 06/27/23 0035  NA 126* 128*  K 4.7 4.9  CL 89* 90*  CO2 25  --   GLUCOSE 1,086* >700*  BUN 22* 24*  CREATININE 1.47* 1.30*  CALCIUM 9.8  --    GFR: Estimated Creatinine Clearance: 90.1 mL/min (A) (by C-G formula based on SCr of 1.3 mg/dL (H)). Liver Function Tests: Recent Labs  Lab 06/27/23 0013  AST 12*  ALT 17  ALKPHOS 88  BILITOT 0.5  PROT 8.0  ALBUMIN 4.2   No results for input(s): "LIPASE", "AMYLASE" in the last 168 hours. No results for input(s): "AMMONIA" in the last 168 hours. Coagulation Profile: No results for input(s): "INR", "PROTIME" in the last 168 hours. Cardiac Enzymes: No results for input(s): "CKTOTAL", "CKMB", "CKMBINDEX", "TROPONINI" in the last 168 hours. BNP (last 3 results) No results for input(s): "PROBNP" in the last 8760 hours. HbA1C: No results for input(s): "HGBA1C" in the last 72 hours. CBG: Recent Labs  Lab 06/27/23 0009  GLUCAP >600*   Lipid Profile: No results for input(s): "CHOL", "HDL", "LDLCALC", "TRIG", "CHOLHDL", "LDLDIRECT" in the last 72 hours. Thyroid Function Tests: No results for input(s): "TSH", "T4TOTAL", "FREET4", "T3FREE", "THYROIDAB" in the last 72 hours. Anemia Panel: No results for input(s): "VITAMINB12", "FOLATE", "FERRITIN", "TIBC", "IRON", "RETICCTPCT" in the last 72 hours. Urine analysis:    Component Value Date/Time   COLORURINE COLORLESS (A) 06/27/2023 0013   APPEARANCEUR CLEAR 06/27/2023  0013   LABSPEC 1.024 06/27/2023 0013   PHURINE 6.0 06/27/2023 0013   GLUCOSEU >=500 (A) 06/27/2023 0013   HGBUR NEGATIVE 06/27/2023 0013   BILIRUBINUR NEGATIVE 06/27/2023 0013   KETONESUR NEGATIVE 06/27/2023 0013   PROTEINUR NEGATIVE 06/27/2023 0013   NITRITE NEGATIVE 06/27/2023 0013   LEUKOCYTESUR NEGATIVE 06/27/2023 0013   Sepsis Labs: @LABRCNTIP (procalcitonin:4,lacticidven:4) )No results found for this or any previous visit (from the past 240 hour(s)).   Radiological Exams on Admission: No results found.  EKG (independently interpreted): Sinus rhythm, LAD.   Assessment/Plan  1. HHS  - This is due to non-compliance with insulin  - A1c was 12.4% in September 2024  - Continue IVF hydration and IV insulin infusion with frequent CBGs and serial chem panels    2. Hypertension  - Hold lisinopril initially given dehydration and increased creatinine  - Treat as-needed only for now    DVT  prophylaxis: Lovenox  Code Status: Full  Level of Care: Level of care: Stepdown Family Communication: None present  Disposition Plan:  Patient is from: home  Anticipated d/c is to: Home  Anticipated d/c date is: 06/29/23  Patient currently: Pending glycemic-control  Consults called: None  Admission status: Inpatient     Briscoe Deutscher, MD [facility]  06/27/2023, 1:47 AM

## 2023-06-27 NOTE — ED Provider Notes (Signed)
[place] EMERGENCY DEPARTMENT AT [facility] Provider Note   CSN: [id number] Arrival date & time: 06/26/23  2357     History  Chief Complaint  Patient presents with   Nausea    [name] [name] is a 37 y.o. male.  The history is provided by the patient.  Hyperglycemia Blood sugar level PTA:  400s Severity:  Severe Onset quality:  Gradual Duration:  3 weeks Timing:  Constant Progression:  Worsening Diabetes status:  Controlled with insulin Context: noncompliance   Context comment:  Not had insulin since getting out of jail Associated symptoms: nausea and vomiting   Associated symptoms: no fever   Risk factors: hx of DKA        Home Medications Prior to Admission medications   Medication Sig Start Date End Date Taking? Authorizing Provider  blood glucose meter kit and supplies KIT Use to check blood sugar three times daily 05/14/23   [name], Deno Lunger, MD  Glucose Blood (BLOOD GLUCOSE TEST STRIPS) STRP Use test strip to check blood sugar three times daily 05/14/23   [name], Deno Lunger, MD  insulin aspart (NOVOLOG) 100 UNIT/ML FlexPen Inject 8 Units into the skin 3 (three) times daily with meals. Only take if eating a meal AND Blood Glucose (BG) is 80 or higher. 05/14/23   [name], Deno Lunger, MD  insulin glargine-yfgn (SEMGLEE) 100 UNIT/ML Pen Inject 25 Units into the skin at bedtime. 05/14/23   [name], Deno Lunger, MD  Insulin Pen Needle (PEN NEEDLES) 31G X 5 MM MISC Use  three times daily 05/14/23   [name], Deno Lunger, MD  Lancet Device MISC 1 each by Does not apply route 3 (three) times daily. May dispense any manufacturer covered by patient's insurance. 05/14/23   [name], Deno Lunger, MD  Lancets MISC Use to check blood sugar three times daily 05/14/23   [name], Deno Lunger, MD  lisinopril (ZESTRIL) 20 MG tablet Take 1 tablet (20 mg total) by mouth daily. 05/14/23 06/13/23  [name], Deno Lunger, MD  Skin Protectants, Misc. (EUCERIN) cream Apply 1 Application  topically as needed for dry skin. 05/20/23   Roxy Horseman, PA-C      Allergies    Patient has no known allergies.    Review of Systems   Review of Systems  Constitutional:  Negative for fever.  Gastrointestinal:  Positive for nausea and vomiting.  All other systems reviewed and are negative.   Physical Exam Updated Vital Signs BP (!) 159/112   Pulse 93   Temp 98.6 F (37 C) (Oral)   Resp 13   Ht 5\' 11"  (1.803 m)   Wt 93.9 kg   SpO2 100%   BMI 28.87 kg/m  Physical Exam Vitals and nursing note reviewed.  Constitutional:      General: He is not in acute distress.    Appearance: He is well-developed. He is not diaphoretic.  HENT:     Head: Normocephalic and atraumatic.     Nose: Nose normal.  Eyes:     Conjunctiva/sclera: Conjunctivae normal.     Pupils: Pupils are equal, round, and reactive to light.  Cardiovascular:     Rate and Rhythm: Normal rate and regular rhythm.  Pulmonary:     Effort: Pulmonary effort is normal.     Breath sounds: Normal breath sounds. No wheezing or rales.  Abdominal:     General: Bowel sounds are normal.     Palpations: Abdomen is soft.     Tenderness: There is no abdominal  tenderness. There is no guarding or rebound.  Musculoskeletal:        General: Normal range of motion.     Cervical back: Normal range of motion and neck supple.  Skin:    General: Skin is warm and dry.     Capillary Refill: Capillary refill takes less than 2 seconds.  Neurological:     General: No focal deficit present.     Mental Status: He is alert and oriented to person, place, and time.  Psychiatric:        Thought Content: Thought content normal.     ED Results / Procedures / Treatments   Labs (all labs ordered are listed, but only abnormal results are displayed) Results for orders placed or performed during the hospital encounter of 06/27/23  CBC  Result Value Ref Range   WBC 5.0 4.0 - 10.5 K/uL   RBC 5.35 4.22 - 5.81 MIL/uL   Hemoglobin 15.3  13.0 - 17.0 g/dL   HCT 81.1 91.4 - 78.2 %   MCV 87.9 80.0 - 100.0 fL   MCH 28.6 26.0 - 34.0 pg   MCHC 32.6 30.0 - 36.0 g/dL   RDW 95.6 21.3 - 08.6 %   Platelets 184 150 - 400 K/uL   nRBC 0.0 0.0 - 0.2 %  Urinalysis, Routine w reflex microscopic -Urine, Clean Catch  Result Value Ref Range   Color, Urine COLORLESS (A) YELLOW   APPearance CLEAR CLEAR   Specific Gravity, Urine 1.024 1.005 - 1.030   pH 6.0 5.0 - 8.0   Glucose, UA >=500 (A) NEGATIVE mg/dL   Hgb urine dipstick NEGATIVE NEGATIVE   Bilirubin Urine NEGATIVE NEGATIVE   Ketones, ur NEGATIVE NEGATIVE mg/dL   Protein, ur NEGATIVE NEGATIVE mg/dL   Nitrite NEGATIVE NEGATIVE   Leukocytes,Ua NEGATIVE NEGATIVE   RBC / HPF 0-5 0 - 5 RBC/hpf   WBC, UA 0-5 0 - 5 WBC/hpf   Bacteria, UA RARE (A) NONE SEEN   Squamous Epithelial / HPF 0-5 0 - 5 /HPF  Comprehensive metabolic panel  Result Value Ref Range   Sodium 126 (L) 135 - 145 mmol/L   Potassium 4.7 3.5 - 5.1 mmol/L   Chloride 89 (L) 98 - 111 mmol/L   CO2 25 22 - 32 mmol/L   Glucose, Bld 1,086 (HH) 70 - 99 mg/dL   BUN 22 (H) 6 - 20 mg/dL   Creatinine, Ser 5.78 (H) 0.61 - 1.24 mg/dL   Calcium 9.8 8.9 - 46.9 mg/dL   Total Protein 8.0 6.5 - 8.1 g/dL   Albumin 4.2 3.5 - 5.0 g/dL   AST 12 (L) 15 - 41 U/L   ALT 17 0 - 44 U/L   Alkaline Phosphatase 88 38 - 126 U/L   Total Bilirubin 0.5 <1.2 mg/dL   GFR, Estimated >62 >95 mL/min   Anion gap 12 5 - 15  Beta-hydroxybutyric acid  Result Value Ref Range   Beta-Hydroxybutyric Acid 0.15 0.05 - 0.27 mmol/L  CBG monitoring, ED  Result Value Ref Range   Glucose-Capillary >600 (HH) 70 - 99 mg/dL  I-stat chem 8, ED (not at [facility], [facility] or [facility])  Result Value Ref Range   Sodium 128 (L) 135 - 145 mmol/L   Potassium 4.9 3.5 - 5.1 mmol/L   Chloride 90 (L) 98 - 111 mmol/L   BUN 24 (H) 6 - 20 mg/dL   Creatinine, Ser 2.84 (H) 0.61 - 1.24 mg/dL   Glucose, Bld >132 (HH) 70 - 99 mg/dL  Calcium, Ion 1.25 1.15 - 1.40 mmol/L   TCO2 28 22 - 32 mmol/L    Hemoglobin 16.7 13.0 - 17.0 g/dL   HCT 53.6 64.4 - 03.4 %   Comment NOTIFIED PHYSICIAN    No results found.  Radiology No results found.  Procedures .Critical Care  Performed by: Cy Blamer, MD Authorized by: Cy Blamer, MD   Critical care provider statement:    Critical care time (minutes):  60   Critical care end time:  06/27/2023 2:00 AM   Critical care time was exclusive of:  Separately billable procedures and treating other patients   Critical care was necessary to treat or prevent imminent or life-threatening deterioration of the following conditions:  Endocrine crisis   Critical care was time spent personally by me on the following activities:  Discussions with consultants, evaluation of patient's response to treatment, examination of patient, obtaining history from patient or surrogate, ordering and performing treatments and interventions, ordering and review of laboratory studies, pulse oximetry, re-evaluation of patient's condition and review of old charts   Care discussed with: admitting provider   Comments:     Insuline drip and IV fluids for critically elevated glucose       Medications Ordered in ED Medications  insulin regular, human (MYXREDLIN) 100 units/ 100 mL infusion (10.5 Units/hr Intravenous New Bag/Given 06/27/23 0126)  lactated ringers infusion ( Intravenous New Bag/Given 06/27/23 0119)  dextrose 5 % in lactated ringers infusion (0 mLs Intravenous Hold 06/27/23 0104)  dextrose 50 % solution 0-50 mL (has no administration in time range)  enoxaparin (LOVENOX) injection 40 mg (has no administration in time range)  potassium chloride 10 mEq in 100 mL IVPB (has no administration in time range)  sodium chloride 0.9 % bolus 1,000 mL (1,000 mLs Intravenous New Bag/Given 06/27/23 0031)  ondansetron (ZOFRAN) injection 4 mg (4 mg Intravenous Given 06/27/23 0030)  lactated ringers bolus 1,878 mL (1,878 mLs Intravenous New Bag/Given 06/27/23 0119)    ED  Course/ Medical Decision Making/ A&P                                 Medical Decision Making Patient here for elevated glucose off his medication for weeks   Amount and/or Complexity of Data Reviewed External Data Reviewed: labs and notes.    Details: Previous notes and labs reviewed  Labs: ordered.    Details: Urine with glucose, no ketones no UTI.  Glucose markedly elevated 1083. Normal Beta hydroxybutyrate .15 Sodium 126 (corrects to normal based on glucose) normal potassium 4.7, elevated creatinine 1.47, normal LFTs. Normal white count 5, normal hemoglobin 15.3, normal platelets   Risk Prescription drug management. Decision regarding hospitalization.    Final Clinical Impression(s) / ED Diagnoses Final diagnoses:  Hyperglycemia  Noncompliance   The patient appears reasonably stabilized for admission considering the current resources, flow, and capabilities available in the ED at this time, and I doubt any other [facility] requiring further screening and/or treatment in the ED prior to admission.  Rx / DC Orders ED Discharge Orders     None         Nyaira Hodgens, MD 06/27/23 7425

## 2023-06-27 NOTE — Plan of Care (Signed)
  Problem: Fluid Volume: Goal: Ability to achieve a balanced intake and output will improve Outcome: Progressing   Problem: Metabolic: Goal: Ability to maintain appropriate glucose levels will improve Outcome: Progressing   Problem: Nutritional: Goal: Maintenance of adequate nutrition will improve Outcome: Progressing

## 2023-06-27 NOTE — Plan of Care (Signed)
  Problem: Coping: Goal: Ability to adjust to condition or change in health will improve Outcome: Progressing   Problem: Nutritional: Goal: Maintenance of adequate nutrition will improve Outcome: Progressing Goal: Maintenance of adequate weight for body size and type will improve Outcome: Progressing

## 2023-06-27 NOTE — ED Notes (Signed)
Date and time results received: 06/27/23 0236 (use smartphrase ".now" to insert current time)  Test: Glucose Critical Value: 1023  Name of Provider Notified: Opyd  Orders Received? Or Actions Taken?:

## 2023-06-27 NOTE — Inpatient Diabetes Management (Signed)
Inpatient Diabetes Program Recommendations  AACE/ADA: New Consensus Statement on Inpatient Glycemic Control   Target Ranges:  Prepandial:   less than 140 mg/dL      Peak postprandial:   less than 180 mg/dL (1-2 hours)      Critically ill patients:  140 - 180 mg/dL    Latest Reference Range & Units 06/27/23 02:58 06/27/23 03:35 06/27/23 04:09 06/27/23 04:44 06/27/23 05:48 06/27/23 06:50 06/27/23 07:49  Glucose-Capillary 70 - 99 mg/dL >161 (HH) >096 (HH) 045 (H) 353 (H) 295 (H) 214 (H) 183 (H)    Latest Reference Range & Units 06/27/23 00:13 06/27/23 00:35 06/27/23 01:40 06/27/23 04:39  CO2 22 - 32 mmol/L 25  24 25   Glucose 70 - 99 mg/dL 4,098 (HH) >119 (HH) 1,478 (HH) 386 (H)  Anion gap 5 - 15  12  11 10     Latest Reference Range & Units 06/27/23 00:35  Beta-Hydroxybutyric Acid 0.05 - 0.27 mmol/L 0.15   Review of Glycemic Control  Diabetes history: DM2 Outpatient Diabetes medications: Semglee 25 units at bedtime, Novolog 8 units TID with meals (per H&P, pt has not taken insulin in 3 weeks) Current orders for Inpatient glycemic control: IV insulin; transitioning to Semglee 20 units daily, Novolog 0-9 units TID with meals, Novolog 0-5 units at bedtime, Novolog 4 units TID with meals  NOTE: Patient well known to inpatient DM team due to multiple ED visits and hospital admissions. Patient was last admitted 05/13/23-05/14/23; inpatient diabetes coordinator attempted to speak with patient on 05/14/23 but patient would not wake up for conversation. Inpatient diabetes coordinator spoke with patient on 05/08/23 during another prior hospital admission.  Per H&P, patient reported he has not taken insulin in 3 weeks. Admitted with HHS with initial glucose of 1086 mg/dl on 29/56/21 and was started on IV insulin. Noted patient now has SQ insulin orders to transition off IV insulin. Will follow along and make recommendations if needed based on data from glucose trends.  Thanks, Orlando Penner, RN, MSN,  CDCES Diabetes Coordinator Inpatient Diabetes Program [phone] (Team Pager from 8am to 5pm)

## 2023-06-27 NOTE — ED Notes (Signed)
Date and time results received: 06/27/23 0052 (use smartphrase ".now" to insert current time)  Test: glucose Critical Value: 1086 mg/dL  Name of Provider Notified: [email or web address]  Orders Received? Or Actions Taken?:

## 2023-06-27 NOTE — ED Notes (Signed)
ED TO INPATIENT HANDOFF REPORT  ED Nurse Name and Phone #: Darryll Capers, RN  S Name/Age/Gender [name] [name] [name] 37 y.o. male Room/Bed: WA02/WA02  Code Status   Code Status: Full Code  Home/SNF/Other Pt is HOmeless Patient oriented to: self, place, time, and situation Is this baseline? Yes   Triage Complete: Triage complete  Chief Complaint Hyperosmolar hyperglycemic state (HHS) (HCC) [E11.00]  Triage Note Patient arrives via EMS for N/V  that started today. States he vomited 3 times. States he has not been able to take his insulin for 3 weeks because he was in jail and unable to refrigerate his insulin once he got out. EMS reports blood sugar 413.    Allergies No Known Allergies  Level of Care/Admitting Diagnosis ED Disposition     ED Disposition  Admit   Condition  --   Comment  Hospital Area: [facility] [[id number]]  Level of Care: Stepdown [14]  Admit to SDU based on following criteria: Severe physiological/psychological symptoms:  Any diagnosis requiring assessment & intervention at least every 4 hours on an ongoing basis to obtain desired patient outcomes including stability and rehabilitation  May admit patient to [facility] [name] [name] [name] if equivalent level of care is available:: Yes  Covid Evaluation: Asymptomatic - no recent exposure (last 10 days) testing not required  Diagnosis: Hyperosmolar hyperglycemic state (HHS) [facility]) [[id number]]  Admitting Physician: Briscoe Deutscher [[id number]]  Attending Physician: Briscoe Deutscher [[id number]]  Certification:: I certify this patient will need inpatient services for at least 2 midnights  Expected Medical Readiness: 06/30/2023          B Medical/Surgery History Past Medical History:  Diagnosis Date   Diabetes mellitus without complication (HCC)    DKA (diabetic ketoacidosis) (HCC) 05/08/2022   HTN (hypertension) 05/08/2022   Hyperlipidemia    Hyperosmolar hyperglycemic state (HHS) (HCC) 05/08/2022    Hypertension    Hypoglycemia 07/02/2019   Pseudohyponatremia 05/08/2022   Past Surgical History:  Procedure Laterality Date   HAND SURGERY       A IV Location/Drains/Wounds Patient Lines/Drains/Airways Status     Active Line/Drains/Airways     Name Placement date Placement time Site Days   Peripheral IV 06/27/23 20 G 1" Left Antecubital 06/27/23  0024  Antecubital  less than 1            Intake/Output Last 24 hours  Intake/Output Summary (Last 24 hours) at 06/27/2023 0439 Last data filed at 06/27/2023 0415 Gross per 24 hour  Intake 3077 ml  Output 1400 ml  Net 1677 ml    Labs/Imaging Results for orders placed [name] [name] performed during the hospital encounter of 06/27/23 (from the past 48 hour(s))  CBG monitoring, ED     Status: Abnormal   Collection Time: 06/27/23 12:09 AM  Result Value Ref Range   Glucose-Capillary >600 (HH) 70 - 99 mg/dL    Comment: Glucose reference range applies only to samples taken after fasting for at least 8 hours.  CBC     Status: None   Collection Time: 06/27/23 12:13 AM  Result Value Ref Range   WBC 5.0 4.0 - 10.5 K/uL   RBC 5.35 4.22 - 5.81 MIL/uL   Hemoglobin 15.3 13.0 - 17.0 g/dL   HCT 52.8 41.3 - 24.4 %   MCV 87.9 80.0 - 100.0 fL   MCH 28.6 26.0 - 34.0 pg   MCHC 32.6 30.0 - 36.0 g/dL   RDW 01.0 27.2 - 53.6 %   Platelets  184 150 - 400 K/uL   nRBC 0.0 0.0 - 0.2 %    Comment: Performed at [facility], [address], [place], [place] [address]  Urinalysis, Routine w reflex microscopic -Urine, Clean Catch     Status: Abnormal   Collection Time: 06/27/23 12:13 AM  Result Value Ref Range   Color, Urine COLORLESS (A) YELLOW   APPearance CLEAR CLEAR   Specific Gravity, Urine 1.024 1.005 - 1.030   pH 6.0 5.0 - 8.0   Glucose, UA >=500 (A) NEGATIVE mg/dL   Hgb urine dipstick NEGATIVE NEGATIVE   Bilirubin Urine NEGATIVE NEGATIVE   Ketones, ur NEGATIVE NEGATIVE mg/dL   Protein, ur NEGATIVE NEGATIVE mg/dL   Nitrite  NEGATIVE NEGATIVE   Leukocytes,Ua NEGATIVE NEGATIVE   RBC / HPF 0-5 0 - 5 RBC/hpf   WBC, UA 0-5 0 - 5 WBC/hpf   Bacteria, UA RARE (A) NONE SEEN   Squamous Epithelial / HPF 0-5 0 - 5 /HPF    Comment: Performed at [facility], [address] [place], [place] [address]  Comprehensive metabolic panel     Status: Abnormal   Collection Time: 06/27/23 12:13 AM  Result Value Ref Range   Sodium 126 (L) 135 - 145 mmol/L   Potassium 4.7 3.5 - 5.1 mmol/L   Chloride 89 (L) 98 - 111 mmol/L   CO2 25 22 - 32 mmol/L   Glucose, Bld 1,086 (HH) 70 - 99 mg/dL    Comment: CRITICAL RESULT CALLED TO, READ BACK BY AND VERIFIED WITH Alinda Deem, RN 06/27/23 0053 BY K .DAVIS Glucose reference range applies only to samples taken after fasting for at least 8 hours.    BUN 22 (H) 6 - 20 mg/dL   Creatinine, Ser 8.29 (H) 0.61 - 1.24 mg/dL   Calcium 9.8 8.9 - 56.2 mg/dL   Total Protein 8.0 6.5 - 8.1 g/dL   Albumin 4.2 3.5 - 5.0 g/dL   AST 12 (L) 15 - 41 U/L   ALT 17 0 - 44 U/L   Alkaline Phosphatase 88 38 - 126 U/L   Total Bilirubin 0.5 <1.2 mg/dL   GFR, Estimated >13 >08 mL/min    Comment: (NOTE) Calculated using the CKD-EPI Creatinine Equation (2021)    Anion gap 12 5 - 15    Comment: Performed at [facility], [address] [place], [place] [address]  I-stat chem 8, ED (not at [facility], [facility] [name] [name] [facility])     Status: Abnormal   Collection Time: 06/27/23 12:35 AM  Result Value Ref Range   Sodium 128 (L) 135 - 145 mmol/L   Potassium 4.9 3.5 - 5.1 mmol/L   Chloride 90 (L) 98 - 111 mmol/L   BUN 24 (H) 6 - 20 mg/dL   Creatinine, Ser 6.96 (H) 0.61 - 1.24 mg/dL   Glucose, Bld >295 (HH) 70 - 99 mg/dL    Comment: Glucose reference range applies only to samples taken after fasting for at least 8 hours.   Calcium, Ion 1.25 1.15 - 1.40 mmol/L   TCO2 28 22 - 32 mmol/L   Hemoglobin 16.7 13.0 - 17.0 g/dL   HCT 28.4 13.2 - 44.0 %   Comment NOTIFIED PHYSICIAN   Beta-hydroxybutyric acid      Status: None   Collection Time: 06/27/23 12:35 AM  Result Value Ref Range   Beta-Hydroxybutyric Acid 0.15 0.05 - 0.27 mmol/L    Comment: Performed at [facility], [address] [place],  [place] [address]  Basic metabolic panel     Status: Abnormal   Collection Time: 06/27/23  1:40 AM  Result Value Ref Range   Sodium 127 (L) 135 - 145 mmol/L   Potassium 5.1 3.5 - 5.1 mmol/L   Chloride 92 (L) 98 - 111 mmol/L   CO2 24 22 - 32 mmol/L   Glucose, Bld 1,023 (HH) 70 - 99 mg/dL    Comment: CRITICAL RESULT CALLED TO, READ BACK BY AND VERIFIED WITH Alinda Deem, RN 06/27/23 0236 BY K. DAVIS Glucose reference range applies only to samples taken after fasting for at least 8 hours.    BUN 20 6 - 20 mg/dL   Creatinine, Ser 6.21 (H) 0.61 - 1.24 mg/dL   Calcium 9.4 8.9 - 30.8 mg/dL   GFR, Estimated >65 >78 mL/min    Comment: (NOTE) Calculated using the CKD-EPI Creatinine Equation (2021)    Anion gap 11 5 - 15    Comment: Performed at [facility], [address] [place], [place] [address]  CBG monitoring, ED     Status: Abnormal   Collection Time: 06/27/23  2:28 AM  Result Value Ref Range   Glucose-Capillary >600 (HH) 70 - 99 mg/dL    Comment: Glucose reference range applies only to samples taken after fasting for at least 8 hours.  CBG monitoring, ED     Status: Abnormal   Collection Time: 06/27/23  2:58 AM  Result Value Ref Range   Glucose-Capillary >600 (HH) 70 - 99 mg/dL    Comment: Glucose reference range applies only to samples taken after fasting for at least 8 hours.  CBG monitoring, ED     Status: Abnormal   Collection Time: 06/27/23  3:35 AM  Result Value Ref Range   Glucose-Capillary >600 (HH) 70 - 99 mg/dL    Comment: Glucose reference range applies only to samples taken after fasting for at least 8 hours.  CBG monitoring, ED     Status: Abnormal   Collection Time: 06/27/23  4:09 AM  Result Value Ref Range   Glucose-Capillary 473 (H) 70 -  99 mg/dL    Comment: Glucose reference range applies only to samples taken after fasting for at least 8 hours.   No results found.  Pending Labs Unresulted Labs (From admission, onward)     Start     Ordered   07/04/23 0500  Creatinine, serum  (enoxaparin (LOVENOX)    CrCl >/= 30 ml/min)  Weekly,   R     Comments: while on enoxaparin therapy    06/27/23 0147   06/27/23 0147  Osmolality  (Hyperglycemic Hyperosmolar State (HHS))  Once,   R        06/27/23 0147   06/27/23 0059  Basic metabolic panel  (Diabetes Ketoacidosis (DKA))  STAT Now then every 4 hours ,   STAT      06/27/23 0059            Vitals/Pain Today's Vitals   06/27/23 0245 06/27/23 0300 06/27/23 0345 06/27/23 0349  BP:   (!) 134/102 (!) 134/102  Pulse:   (!) 103 94  Resp: 15   15  Temp:    98.6 F (37 C)  TempSrc:    Oral  SpO2:  100% 100%   Weight:      Height:      PainSc:    Asleep    Isolation Precautions No active isolations  Medications Medications  insulin regular, human (MYXREDLIN) 100 units/ 100  mL infusion (10.5 Units/hr Intravenous New Bag/Given 06/27/23 0126)  lactated ringers infusion ( Intravenous New Bag/Given 06/27/23 0119)  dextrose 5 % in lactated ringers infusion (0 mLs Intravenous Hold 06/27/23 0104)  dextrose 50 % solution 0-50 mL (has no administration in time range)  enoxaparin (LOVENOX) injection 40 mg (has no administration in time range)  sodium chloride 0.9 % bolus 1,000 mL (0 mLs Intravenous Stopped 06/27/23 0120)  ondansetron (ZOFRAN) injection 4 mg (4 mg Intravenous Given 06/27/23 0030)  lactated ringers bolus 1,878 mL (0 mLs Intravenous Stopped 06/27/23 0311)  potassium chloride 10 mEq in 100 mL IVPB (0 mEq Intravenous Stopped 06/27/23 0415)    Mobility walks     Focused Assessments HHS   R Recommendations: See Admitting Provider Note  Report given to:   Additional Notes: pt just completed 2nd run of K+ and 0500 labs sent

## 2023-06-28 LAB — GLUCOSE, CAPILLARY
Glucose-Capillary: 380 mg/dL — ABNORMAL HIGH (ref 70–99)
Glucose-Capillary: 343 mg/dL — ABNORMAL HIGH (ref 70–99)
Glucose-Capillary: 231 mg/dL — ABNORMAL HIGH (ref 70–99)
Glucose-Capillary: 173 mg/dL — ABNORMAL HIGH (ref 70–99)

## 2023-06-28 NOTE — Plan of Care (Signed)
  Problem: Coping: Goal: Ability to adjust to condition or change in health will improve Outcome: Progressing   Problem: Nutritional: Goal: Maintenance of adequate nutrition will improve Outcome: Progressing Goal: Maintenance of adequate weight for body size and type will improve Outcome: Progressing   Problem: Metabolic: Goal: Ability to maintain appropriate glucose levels will improve Outcome: Progressing

## 2023-06-28 NOTE — Progress Notes (Signed)
PROGRESS NOTE [name] [name]  ZOX:[id number] DOB: [birth year] DOA: 06/27/2023 PCP: [name], NP  Brief Narrative/Hospital Course: 54 yom w/ PMH significant for uncontrolled diabetes mellitus, hypertension, and substance abuse who presents with nausea and vomiting.  Patient states that he has not taken any insulin in 3 weeks.  He states that he been doing well despite this until yesterday when he developed nausea with nonbloody vomiting some abdominal discomfort  In WJ:[id number] and not hypoxic,BP on higher side, labs w/ glucose 1086, normal serum bicarbonate, normal anion gap, creatinine 1.47, normal WBC, and normal beta-hydroxybutyrate. He was given 3 L of IV fluids, Zofran, and started on IV insulin infusion And admitted for further management of HHS.  Patient was treated with IV insulin, IV fluids slowly transition to subcu basal bolus regimen seen by diabetes coordinator-meds were further adjusted.  Patient is homeless will needs ToC help with insulin refills       Subjective: Seen and examined.  Resting comfortably Has been drinking regular soda at bedside   Assessment and Plan: Principal Problem:   Hyperosmolar hyperglycemic state (HHS) (HCC)   HHS Diabetes mellitus with uncontrolled hyperglycemia: A1c 12 in sept 24.  Not taking insulin for 3 weeks.  Managed with IV fluids IV insulin drip> transitioned to Semglee and Premeal insulin-adjusted insulin further blood sugar remains poorly controlled we will continue to modify regimen today. Keep on SSI. Discussed extensively for the need to be compliant Recent Labs  Lab 06/27/23 0951 06/27/23 1149 06/27/23 1718 06/27/23 2037 06/28/23 0735  GLUCAP 239* 363* 324* 386* 380*   Hypertension: Poorly controlled PTA , on lisinopril RESUMED and BP stable   AKI: Prerenal from HHS improved to 1.0 from 1.3  DVT prophylaxis: enoxaparin (LOVENOX) injection 40 mg Start: 06/27/23 1000 Code Status:   Code Status: Full Code Family  Communication: plan of care discussed with patient at bedside. Patient status is:  admitted as observation but remains hospitalized for ongoing  because of HHS Level of care: Med-Surg   Dispo: The patient is from: Homeless            Anticipated disposition: TOC consulted.  Objective: Vitals last 24 hrs: Vitals:   06/27/23 2034 06/28/23 0251 06/28/23 0543 06/28/23 1049  BP: (!) 133/90 (!) 135/100 137/89 134/84  Pulse: 98 92 84 (!) 101  Resp: 16 16 18 16   Temp: 99 F (37.2 C) 99 F (37.2 C) 98.7 F (37.1 C)   TempSrc:      SpO2: 96% 97% 100% 98%  Weight:      Height:       Weight change:   Physical Examination: General exam: alert awake, older than stated age HEENT:Oral mucosa moist, Ear/Nose WNL grossly Respiratory system: bilaterally clear BS, no use of accessory muscle Cardiovascular system: S1 & S2 +, No JVD. Gastrointestinal system: Abdomen soft,NT,ND, BS+ Nervous System:Alert, awake, moving extremities. Extremities: LE edema neg,distal peripheral pulses palpable.  Skin: No rashes,no icterus. MSK: Normal muscle bulk,tone, power  Medications reviewed:  Scheduled Meds:  enoxaparin (LOVENOX) injection  40 mg Subcutaneous Q24H   insulin aspart  0-5 Units Subcutaneous QHS   insulin aspart  0-9 Units Subcutaneous TID WC   insulin aspart  12 Units Subcutaneous TID WC   insulin glargine-yfgn  30 Units Subcutaneous Daily   lisinopril  20 mg Oral Daily  Continuous Infusions:    Diet Order             Diet Carb Modified Fluid consistency: Thin; Room service  appropriate? Yes  Diet effective now                  Intake/Output Summary (Last 24 hours) at 06/28/2023 1051 Last data filed at 06/27/2023 1813 Gross per 24 hour  Intake 814.73 ml  Output 0 ml  Net 814.73 ml   Net IO Since Admission: 3,361.3 mL [06/28/23 1051]  Wt Readings from Last 3 Encounters:  06/27/23 93.5 kg  05/13/23 104.3 kg  05/07/23 98 kg     Unresulted Labs (From admission, onward)      Start     Ordered   07/04/23 0500  Creatinine, serum  (enoxaparin (LOVENOX)    CrCl >/= 30 ml/min)  Weekly,   R     Comments: while on enoxaparin therapy    06/27/23 0147   06/27/23 0515  MRSA Next Gen by PCR, Nasal  Once,   R        06/27/23 0514          Data Reviewed: I have personally reviewed following labs and imaging studies CBC: Recent Labs  Lab 06/27/23 0013 06/27/23 0035  WBC 5.0  --   HGB 15.3 16.7  HCT 47.0 49.0  MCV 87.9  --   PLT 184  --    Basic Metabolic Panel:  Recent Labs  Lab 06/27/23 0140 06/27/23 0439 06/27/23 1006 06/27/23 1204 06/27/23 1654  NA 127* 137 137 135 134*  K 5.1 4.1 3.5 3.8 3.9  CL 92* 102 101 100 97*  CO2 24 25 29 27 27   GLUCOSE 1,023* 386* 283* 335* 337*  BUN 20 17 17 16 20   CREATININE 1.28* 1.07 0.96 0.90 1.07  CALCIUM 9.4 10.2 9.2 9.3 9.6   GFR: Estimated Creatinine Clearance: 109.4 mL/min (by C-G formula based on SCr of 1.07 mg/dL). Liver Function Tests:  Recent Labs  Lab 06/27/23 0013  AST 12*  ALT 17  ALKPHOS 88  BILITOT 0.5  PROT 8.0  ALBUMIN 4.2   Recent Labs  Lab 06/27/23 0951 06/27/23 1149 06/27/23 1718 06/27/23 2037 06/28/23 0735  GLUCAP 239* 363* 324* 386* 380*  No results for input(s): "CHOL", "HDL", "LDLCALC", "TRIG", "CHOLHDL", "LDLDIRECT" in the last 72 hours. No results for input(s): "TSH", "T4TOTAL", "FREET4", "T3FREE", "THYROIDAB" in the last 72 hours. Sepsis Labs: No results for input(s): "PROCALCITON", "LATICACIDVEN" in the last 168 hours.  No results found for this or any previous visit (from the past 240 hour(s)).  Antimicrobials: Anti-infectives (From admission, onward)    None      Culture/Microbiology    Component Value Date/Time   SDES  05/19/2022 1356    BLOOD BLOOD LEFT HAND Performed at [facility], [address], [place], [place] [address]    SDES  05/19/2022 1356    BLOOD LEFT ANTECUBITAL Performed at [facility], [address] [place], [place] [address]    SPECREQUEST  05/19/2022 1356    BOTTLES DRAWN AEROBIC ONLY Blood Culture adequate volume Performed at [facility], [address] [place], [place] [address]    SPECREQUEST  05/19/2022 1356    BOTTLES DRAWN AEROBIC ONLY Blood Culture adequate volume Performed at [facility], [address], [place], [place] [address]    CULT  05/19/2022 1356    NO GROWTH 5 DAYS Performed at [facility], [address] [place], [place] [address]    CULT  05/19/2022 1356    NO GROWTH 5 DAYS Performed at  [facility], [address] [place], [place] [address]    REPTSTATUS 05/24/2022 FINAL 05/19/2022 1356   REPTSTATUS 05/24/2022 FINAL 05/19/2022 1356    Radiology Studies: No results found.  LOS: 1 day   Total time spent in review of labs and imaging, patient evaluation, formulation of plan, documentation and communication with family: 35 minutes  Lanae Boast, MD [facility]  06/28/2023, 10:51 AM

## 2023-06-28 NOTE — Plan of Care (Signed)
  Problem: Metabolic: Goal: Ability to maintain appropriate glucose levels will improve Outcome: Progressing   Problem: Metabolic: Goal: Ability to maintain appropriate glucose levels will improve Outcome: Progressing   Problem: Nutritional: Goal: Maintenance of adequate nutrition will improve Outcome: Progressing

## 2023-06-28 NOTE — Plan of Care (Signed)
  Problem: Education: Goal: Ability to describe self-care measures that may prevent or decrease complications (Diabetes Survival Skills Education) will improve Outcome: Progressing Goal: Individualized Educational Video(s) Outcome: Completed/Met   Problem: Coping: Goal: Ability to adjust to condition or change in health will improve Outcome: Progressing   Problem: Fluid Volume: Goal: Ability to maintain a balanced intake and output will improve Outcome: Progressing   Problem: Health Behavior/Discharge Planning: Goal: Ability to identify and utilize available resources and services will improve Outcome: Progressing Goal: Ability to manage health-related needs will improve Outcome: Adequate for Discharge   Problem: Metabolic: Goal: Ability to maintain appropriate glucose levels will improve Outcome: Progressing   Problem: Nutritional: Goal: Maintenance of adequate nutrition will improve Outcome: Completed/Met Goal: Progress toward achieving an optimal weight will improve Outcome: Progressing   Problem: Skin Integrity: Goal: Risk for impaired skin integrity will decrease Outcome: Completed/Met   Problem: Tissue Perfusion: Goal: Adequacy of tissue perfusion will improve Outcome: Adequate for Discharge   Problem: Cardiac: Goal: Ability to maintain an adequate cardiac output will improve Outcome: Completed/Met   Problem: Fluid Volume: Goal: Ability to achieve a balanced intake and output will improve Outcome: Adequate for Discharge   Problem: Nutritional: Goal: Maintenance of adequate nutrition will improve Outcome: Adequate for Discharge Goal: Maintenance of adequate weight for body size and type will improve Outcome: Adequate for Discharge   Problem: Respiratory: Goal: Will regain and/or maintain adequate ventilation Outcome: Completed/Met   Problem: Education: Goal: Knowledge of General Education information will improve Description: Including pain rating scale,  medication(s)/side effects and non-pharmacologic comfort measures Outcome: Adequate for Discharge   Problem: Health Behavior/Discharge Planning: Goal: Ability to manage health-related needs will improve Outcome: Progressing   Problem: Clinical Measurements: Goal: Ability to maintain clinical measurements within normal limits will improve Outcome: Progressing Goal: Will remain free from infection Outcome: Progressing Goal: Diagnostic test results will improve Outcome: Progressing Goal: Respiratory complications will improve Outcome: Progressing Goal: Cardiovascular complication will be avoided Outcome: Progressing   Problem: Activity: Goal: Risk for activity intolerance will decrease Outcome: Adequate for Discharge   Problem: Nutrition: Goal: Adequate nutrition will be maintained Outcome: Completed/Met   Problem: Coping: Goal: Level of anxiety will decrease Outcome: Progressing   Problem: Elimination: Goal: Will not experience complications related to bowel motility Outcome: Completed/Met Goal: Will not experience complications related to urinary retention Outcome: Completed/Met   Problem: Pain Management: Goal: General experience of comfort will improve Outcome: Adequate for Discharge   Problem: Safety: Goal: Ability to remain free from injury will improve Outcome: Adequate for Discharge   Problem: Skin Integrity: Goal: Risk for impaired skin integrity will decrease Outcome: Adequate for Discharge

## 2023-06-28 NOTE — TOC Initial Note (Addendum)
Transition of Care [facility]) - Initial/Assessment Note    Patient Details  Name: [name] [name] MRN: [id number] Date of Birth: 07/30/1985  [facility] CM/SW Contact:    Darleene Cleaver, LCSW Phone Number: 06/28/2023, 6:41 PM  Clinical Narrative:                  Patient was just admitted to hospital Oct 16th, and has been admitted 10 times in the last 6 months due to noncompliance with his insulin.    Patient is a well known to TOC.  Patient has been provided Match letter at each of his admissions with no copay, patient does not follow up with MD appointments.  Meds were brought to patient's bed and he discharged with them.    Patient is noncompliant with his insulin and other medications.  Patient is homeless, he stays at the shelter or a hotel.  Patient has history of drug use as well.    Each admission patient is provided resources for housing, substance abuse resources, DSS services, and health department.   Patient usually needs a bus pass which is provided each time.    Appointment was scheduled to get established with a PCP, however it appears patient did not go to it.  Resources have been provided for transportation from DSS, however patient does not follow through.  TOC is unable to provide anymore resources, patient is aware of his options, but needs to follow through.  TOC to continue to follow.  Resources added to AVS again.  Expected Discharge Plan: Homeless Shelter Barriers to Discharge: Continued Medical Work up   Patient Goals and CMS Choice Patient states their goals for this hospitalization and ongoing recovery are:: To return back to homeless shelter          Expected Discharge Plan and Services In-house Referral: Clinical Social Work, Conservator, museum/gallery Services: Medication Assistance, Indigent Health Clinic, MATCH Program   Living arrangements for the past 2 months: Homeless Shelter                                       Prior Living Arrangements/Services Living arrangements for the past 2 months: Homeless Shelter Lives with:: Other (Comment) (Homeless) Patient language and need for interpreter reviewed:: Yes Do you feel safe going back to the place where you live?: Yes      Need for Family Participation in Patient Care: No (Comment) Care giver support system in place?: No (comment)   Criminal Activity/Legal Involvement Pertinent to Current Situation/Hospitalization: No - Comment as needed  Activities of Daily Living   ADL Screening (condition at time of admission) Independently performs ADLs?: Yes (appropriate for developmental age) Is the patient deaf or have difficulty hearing?: No Does the patient have difficulty seeing, even when wearing glasses/contacts?: No Does the patient have difficulty concentrating, remembering, or making decisions?: No  Permission Sought/Granted   Permission granted to share information with : Yes, Verbal Permission Granted, Yes, Release of Information Signed  Share Information with NAME: HENRY,DOE Father   [phone]           Emotional Assessment Appearance:: Appears stated age Attitude/Demeanor/Rapport: Other (comment) (Not very engaging) Affect (typically observed): Accepting, Appropriate, Calm Orientation: : Oriented to Place, Oriented to  Time, Oriented to Situation, Oriented to Self Alcohol / Substance Use: Illicit Drugs Psych Involvement: Yes (comment)  Admission diagnosis:  Hyperglycemia [R73.9] Noncompliance [Z91.199]  Hyperosmolar hyperglycemic state (HHS) (HCC) [E11.00] Patient Active Problem List   Diagnosis Date Noted   Hypoglycemia associated with type 2 diabetes mellitus (HCC) 05/07/2023   Hyperglycemia 04/20/2023   Acute metabolic encephalopathy 04/20/2023   Abdominal pain 01/01/2023   Tobacco abuse 01/01/2023   MSSA bacteremia 05/19/2022   Hyperosmolar hyperglycemic state (HHS) (HCC) 05/16/2022   HTN (hypertension) 05/08/2022   AKI  (acute kidney injury) (HCC) 05/08/2022   Pseudohyponatremia 05/08/2022   DM2 (diabetes mellitus, type 2) (HCC) 05/08/2022   Substance induced mood disorder (HCC) 04/06/2022   PCP:  [name], NP Pharmacy:   [facility] [address] [place] [address] Phone: [phone] Fax: [phone]     Social Determinants of Health (SDOH) Social History: SDOH Screenings   Food Insecurity: Patient Declined (06/28/2023)  Recent Concern: Food Insecurity - Food Insecurity Present (06/27/2023)  Housing: Patient Declined (06/28/2023)  Recent Concern: Housing - High Risk (05/14/2023)  Transportation Needs: Patient Declined (06/28/2023)  Recent Concern: Transportation Needs - Unmet Transportation Needs (06/27/2023)  Utilities: Patient Declined (06/27/2023)  Recent Concern: Utilities - At Risk (05/13/2023)  Tobacco Use: Low Risk  (06/27/2023)   SDOH Interventions: Food Insecurity Interventions: Inpatient TOC Housing Interventions: Inpatient TOC Transportation Interventions: Inpatient TOC, Patient Unable to Answer   Readmission Risk Interventions    06/28/2023    6:34 PM  Readmission Risk Prevention Plan  Transportation Screening Complete  Medication Review (RN Care Manager) Referral to Pharmacy  PCP or Specialist appointment within 3-5 days of discharge Complete  HRI or Home Care Consult Not Complete  HRI or Home Care Consult Pt Refusal Comments Not eligible for [facility]  SW Recovery Care/Counseling Consult Complete  Palliative Care Screening Not Applicable  Skilled Nursing Facility Not Applicable

## 2023-06-29 LAB — GLUCOSE, CAPILLARY: Glucose-Capillary: 343 mg/dL — ABNORMAL HIGH (ref 70–99)

## 2023-06-29 MED ORDER — INSULIN GLARGINE-YFGN 100 UNIT/ML ~~LOC~~ SOLN
40.0000 [IU] | Freq: Every day | SUBCUTANEOUS | Status: DC
Start: 2023-06-29 — End: 2023-06-29
  Administered 2023-06-29: 40 [IU] via SUBCUTANEOUS
  Filled 2023-06-29: qty 0.4

## 2023-06-29 MED ORDER — INSULIN ASPART 100 UNIT/ML IJ SOLN
15.0000 [IU] | Freq: Three times a day (TID) | INTRAMUSCULAR | Status: DC
Start: 2023-06-29 — End: 2023-06-29

## 2023-06-29 NOTE — Discharge Summary (Signed)
                                                         AMA  Patient at this time expresses desire to leave the Hospital immidiately, patient has been warned that this is not Medically advisable at this time, and can result in Medical complications like Death and Disability, patient understands and accepts the risks involved and assumes full responsibilty of this decision. I made my best effort to convince him to stay.  Lanae Boast M.D on 06/29/2023 at 3:13 PM  [facility]  Time < 30 minutes  Last Note Below   43 yom w/ PMH significant for uncontrolled diabetes mellitus, hypertension, and substance abuse who presents with nausea and vomiting.  Patient states that he has not taken any insulin in 3 weeks.  He states that he been doing well despite this until yesterday when he developed nausea with nonbloody vomiting some abdominal discomfort  In WU:[id number] and not hypoxic,BP on higher side, labs w/ glucose 1086, normal serum bicarbonate, normal anion gap, creatinine 1.47, normal WBC, and normal beta-hydroxybutyrate. He was given 3 L of IV fluids, Zofran, and started on IV insulin infusion And admitted for further management of HHS.  Patient was treated with IV insulin, IV fluids slowly transition to subcu basal bolus regimen seen by diabetes coordinator-meds were further adjusted.  Patient is homeless will needs ToC help with insulin refills His insulin regimen is being adjusted to optimize his blood sugar. Toc CONSULTED to help with resources His blood sugar was still poorly controlled this am.  Early in am, he desired to leave hospital as he was " bored: To RN he stated he will get insulin 70/30 from [facility] and left AMA before I could come and talk to him this am. But he has been warned of risk fo DKA HHS that could be fatal or lead to significant disability   His blood sugar was still poorly  controlled. He stated he will get insulin 70/30 from [facility] and left AMA

## 2023-06-29 NOTE — Progress Notes (Signed)
Patient requested to leave against medical advice. Risks were discussed and the patient was insistent that they leave. Patient stated that he would get 70/30 insulin at [facility] and did not need to stay. Patient signed the leaving against medical advice form.

## 2023-06-29 NOTE — Plan of Care (Signed)
  Problem: Metabolic: Goal: Ability to maintain appropriate glucose levels will improve Outcome: Progressing   Problem: Clinical Measurements: Goal: Ability to maintain clinical measurements within normal limits will improve Outcome: Progressing   Problem: Activity: Goal: Risk for activity intolerance will decrease Outcome: Progressing

## 2023-07-01 ENCOUNTER — Emergency Department (HOSPITAL_COMMUNITY)
Admission: EM | Admit: 2023-07-01 | Discharge: 2023-07-01 | Discharge status: Against Medical Advice | Payer: Medicaid Other | Attending: Emergency Medicine | Admitting: Emergency Medicine

## 2023-07-01 DIAGNOSIS — H538 Other visual disturbances: Secondary | ICD-10-CM | POA: Diagnosis not present

## 2023-07-01 DIAGNOSIS — E1100 Type 2 diabetes mellitus with hyperosmolarity without nonketotic hyperglycemic-hyperosmolar coma (NKHHC): Secondary | ICD-10-CM | POA: Insufficient documentation

## 2023-07-01 DIAGNOSIS — I10 Essential (primary) hypertension: Secondary | ICD-10-CM | POA: Diagnosis not present

## 2023-07-01 DIAGNOSIS — F17200 Nicotine dependence, unspecified, uncomplicated: Secondary | ICD-10-CM | POA: Insufficient documentation

## 2023-07-01 DIAGNOSIS — R112 Nausea with vomiting, unspecified: Secondary | ICD-10-CM | POA: Diagnosis not present

## 2023-07-01 DIAGNOSIS — Z794 Long term (current) use of insulin: Secondary | ICD-10-CM | POA: Insufficient documentation

## 2023-07-01 DIAGNOSIS — Z79899 Other long term (current) drug therapy: Secondary | ICD-10-CM | POA: Diagnosis not present

## 2023-07-01 DIAGNOSIS — F419 Anxiety disorder, unspecified: Secondary | ICD-10-CM | POA: Diagnosis not present

## 2023-07-01 DIAGNOSIS — Z5329 Procedure and treatment not carried out because of patient's decision for other reasons: Secondary | ICD-10-CM | POA: Diagnosis not present

## 2023-07-01 DIAGNOSIS — E1165 Type 2 diabetes mellitus with hyperglycemia: Secondary | ICD-10-CM | POA: Insufficient documentation

## 2023-07-01 LAB — COMPREHENSIVE METABOLIC PANEL
ALT: 18 U/L (ref 0–44)
AST: 21 U/L (ref 15–41)
Albumin: 4.1 g/dL (ref 3.5–5.0)
Alkaline Phosphatase: 67 U/L (ref 38–126)
Anion gap: 11 (ref 5–15)
BUN: 24 mg/dL — ABNORMAL HIGH (ref 6–20)
CO2: 26 mmol/L (ref 22–32)
Calcium: 10.1 mg/dL (ref 8.9–10.3)
Chloride: 92 mmol/L — ABNORMAL LOW (ref 98–111)
Creatinine, Ser: 1.24 mg/dL (ref 0.61–1.24)
GFR, Estimated: >60 mL/min (ref >60)
Glucose, Bld: 700 mg/dL (ref 70–99)
Potassium: 5.2 mmol/L — ABNORMAL HIGH (ref 3.5–5.1)
Sodium: 129 mmol/L — ABNORMAL LOW (ref 135–145)
Total Bilirubin: 0.9 mg/dL (ref <1.2)
Total Protein: 7.7 g/dL (ref 6.5–8.1)

## 2023-07-01 LAB — URINALYSIS, ROUTINE W REFLEX MICROSCOPIC
Bacteria, UA: NONE SEEN
Bilirubin Urine: NEGATIVE
Glucose, UA: >=500 mg/dL — AB
Hgb urine dipstick: NEGATIVE
Ketones, ur: NEGATIVE mg/dL
Leukocytes,Ua: NEGATIVE
Nitrite: NEGATIVE
Protein, ur: NEGATIVE mg/dL
Specific Gravity, Urine: 1.024 (ref 1.005–1.030)
pH: 6 (ref 5.0–8.0)

## 2023-07-01 LAB — BETA-HYDROXYBUTYRIC ACID: Beta-Hydroxybutyric Acid: 0.37 mmol/L — ABNORMAL HIGH (ref 0.05–0.27)

## 2023-07-01 LAB — CBC
HCT: 48.6 % (ref 39.0–52.0)
Hemoglobin: 16.1 g/dL (ref 13.0–17.0)
MCH: 28 pg (ref 26.0–34.0)
MCHC: 33.1 g/dL (ref 30.0–36.0)
MCV: 84.4 fL (ref 80.0–100.0)
Platelets: 212 10*3/uL (ref 150–400)
RBC: 5.76 MIL/uL (ref 4.22–5.81)
RDW: 12.6 % (ref 11.5–15.5)
WBC: 4.7 10*3/uL (ref 4.0–10.5)
nRBC: 0 % (ref 0.0–0.2)

## 2023-07-01 LAB — I-STAT CHEM 8, ED
BUN: 30 mg/dL — ABNORMAL HIGH (ref 6–20)
Calcium, Ion: 1.17 mmol/L (ref 1.15–1.40)
Chloride: 95 mmol/L — ABNORMAL LOW (ref 98–111)
Creatinine, Ser: 1.1 mg/dL (ref 0.61–1.24)
Glucose, Bld: >700 mg/dL (ref 70–99)
HCT: 50 % (ref 39.0–52.0)
Hemoglobin: 17 g/dL (ref 13.0–17.0)
Potassium: 5.1 mmol/L (ref 3.5–5.1)
Sodium: 130 mmol/L — ABNORMAL LOW (ref 135–145)
TCO2: 29 mmol/L (ref 22–32)

## 2023-07-01 LAB — I-STAT VENOUS BLOOD GAS, ED
Acid-Base Excess: 4 mmol/L — ABNORMAL HIGH (ref 0.0–2.0)
Bicarbonate: 31.2 mmol/L — ABNORMAL HIGH (ref 20.0–28.0)
Calcium, Ion: 1.17 mmol/L (ref 1.15–1.40)
HCT: 50 % (ref 39.0–52.0)
Hemoglobin: 17 g/dL (ref 13.0–17.0)
O2 Saturation: 95 %
Potassium: 5.1 mmol/L (ref 3.5–5.1)
Sodium: 129 mmol/L — ABNORMAL LOW (ref 135–145)
TCO2: 33 mmol/L — ABNORMAL HIGH (ref 22–32)
pCO2, Ven: 55.4 mm[Hg] (ref 44–60)
pH, Ven: 7.358 (ref 7.25–7.43)
pO2, Ven: 82 mm[Hg] — ABNORMAL HIGH (ref 32–45)

## 2023-07-01 LAB — CBG MONITORING, ED
Glucose-Capillary: >600 mg/dL (ref 70–99)
Glucose-Capillary: 566 mg/dL (ref 70–99)

## 2023-07-01 LAB — LIPASE, BLOOD: Lipase: 45 U/L (ref 11–51)

## 2023-07-01 MED ORDER — SODIUM CHLORIDE 0.9 % IV BOLUS
1000.0000 mL | Freq: Once | INTRAVENOUS | Status: AC
  Administered 2023-07-01: 1000 mL via INTRAVENOUS

## 2023-07-01 MED ORDER — INSULIN ASPART 100 UNIT/ML IJ SOLN
10.0000 [IU] | Freq: Once | INTRAMUSCULAR | Status: AC
Start: 2023-07-01 — End: 2023-07-01
  Administered 2023-07-01: 10 [IU] via INTRAVENOUS

## 2023-07-01 MED ORDER — INSULIN ASPART 100 UNIT/ML FLEXPEN: 8.0000 [IU] | PEN_INJECTOR | Freq: Three times a day (TID) | SUBCUTANEOUS | 0 refills | Status: DC

## 2023-07-01 MED ORDER — INSULIN GLARGINE-YFGN 100 UNIT/ML ~~LOC~~ SOPN: 25.0000 [IU] | PEN_INJECTOR | Freq: Every day | SUBCUTANEOUS | 0 refills | Status: DC

## 2023-07-01 NOTE — ED Triage Notes (Addendum)
Pt to ED via [facility] from [place]. Pt has hx of DM. Pt called EMS stating he felt like his blood sugar was high. Upon EMS arrival pt's CBG was 462. Pt is non med compliant. Pt also c/o N/V that started today. Pt denies pain.  Pt also reports smoking marijuana tonight.    180/110 110 HR 462 CBG 98% RA 18 RR

## 2023-07-01 NOTE — ED Provider Notes (Signed)
[place] EMERGENCY DEPARTMENT AT [facility] Provider Note   CSN: [id number] Arrival date & time: 07/01/23  2022     History  Chief Complaint  Patient presents with   Hyperglycemia    [name] [name] is a 37 y.o. male with past medical history for hypertension, diabetes, previous history of HHS, acute metabolic encephalopathy, substance-induced mood disorder who presents with concern for elevated blood sugar, patient reports some blurry vision and nausea and vomiting started today, increased thirst and increased urination.  Patient reports smoking marijuana tonight.  He reports that he has been out of his insulin for several days, attempted to be seen at [facility] 3 days ago but left without being seen secondary to wait time.   Hyperglycemia      Home Medications Prior to Admission medications   Medication Sig Start Date End Date Taking? Authorizing Provider  lisinopril (ZESTRIL) 20 MG tablet Take 1 tablet (20 mg total) by mouth daily. 05/14/23 07/01/23 Yes [name], Deno Lunger, MD  blood glucose meter kit and supplies KIT Use to check blood sugar three times daily 05/14/23   [name], Deno Lunger, MD  Glucose Blood (BLOOD GLUCOSE TEST STRIPS) STRP Use test strip to check blood sugar three times daily 05/14/23   [name], Deno Lunger, MD  insulin aspart (NOVOLOG) 100 UNIT/ML FlexPen Inject 8 Units into the skin 3 (three) times daily with meals. Only take if eating a meal AND Blood Glucose (BG) is 80 or higher. 07/01/23   Delroy Ordway H, PA-C  insulin glargine-yfgn (SEMGLEE) 100 UNIT/ML Pen Inject 25 Units into the skin at bedtime. 07/01/23   Janila Arrazola H, PA-C  Insulin Pen Needle (PEN NEEDLES) 31G X 5 MM MISC Use  three times daily 05/14/23   [name], Deno Lunger, MD  Lancet Device MISC 1 each by Does not apply route 3 (three) times daily. May dispense any manufacturer covered by patient's insurance. 05/14/23   [name], Deno Lunger, MD  Lancets MISC Use to check blood  sugar three times daily 05/14/23   [name], Deno Lunger, MD      Allergies    Patient has no known allergies.    Review of Systems   Review of Systems  All other systems reviewed and are negative.   Physical Exam Updated Vital Signs BP (!) 159/115 (BP Location: Left Arm)   Pulse (!) 101   Temp 98.3 F (36.8 C) (Oral)   Resp 14   SpO2 100%  Physical Exam Vitals and nursing note reviewed.  Constitutional:      General: He is not in acute distress.    Appearance: Normal appearance.  HENT:     Head: Normocephalic and atraumatic.     Mouth/Throat:     Comments: Dry mucous membranes Eyes:     General:        Right eye: No discharge.        Left eye: No discharge.  Cardiovascular:     Rate and Rhythm: Normal rate and regular rhythm.     Heart sounds: No murmur heard.    No friction rub. No gallop.  Pulmonary:     Effort: Pulmonary effort is normal.     Breath sounds: Normal breath sounds.  Abdominal:     General: Bowel sounds are normal.     Palpations: Abdomen is soft.  Skin:    General: Skin is warm and dry.     Capillary Refill: Capillary refill takes less than 2 seconds.  Neurological:  Mental Status: He is alert and oriented to person, place, and time.  Psychiatric:        Mood and Affect: Mood normal.        Behavior: Behavior normal.     Comments: Anxious, cannot sit still, but denies any SI, HI, AVH, does not seem to be responding to internal stimuli but is somewhat wired/erratic     ED Results / Procedures / Treatments   Labs (all labs ordered are listed, but only abnormal results are displayed) Labs Reviewed  URINALYSIS, ROUTINE W REFLEX MICROSCOPIC - Abnormal; Notable for the following components:      Result Value   Color, Urine COLORLESS (*)    Glucose, UA >=500 (*)    All other components within normal limits  COMPREHENSIVE METABOLIC PANEL - Abnormal; Notable for the following components:   Sodium 129 (*)    Potassium 5.2 (*)    Chloride 92  (*)    Glucose, Bld 700 (*)    BUN 24 (*)    All other components within normal limits  BETA-HYDROXYBUTYRIC ACID - Abnormal; Notable for the following components:   Beta-Hydroxybutyric Acid 0.37 (*)    All other components within normal limits  CBG MONITORING, ED - Abnormal; Notable for the following components:   Glucose-Capillary >600 (*)    All other components within normal limits  I-STAT VENOUS BLOOD GAS, ED - Abnormal; Notable for the following components:   pO2, Ven 82 (*)    Bicarbonate 31.2 (*)    TCO2 33 (*)    Acid-Base Excess 4.0 (*)    Sodium 129 (*)    All other components within normal limits  I-STAT CHEM 8, ED - Abnormal; Notable for the following components:   Sodium 130 (*)    Chloride 95 (*)    BUN 30 (*)    Glucose, Bld >700 (*)    All other components within normal limits  CBG MONITORING, ED - Abnormal; Notable for the following components:   Glucose-Capillary 566 (*)    All other components within normal limits  CBC  LIPASE, BLOOD    EKG None  Radiology No results found.  Procedures .Ultrasound ED Peripheral IV (Provider)  Date/Time: 07/01/2023 9:45 PM  Performed by: Olene Floss, PA-C Authorized by: Olene Floss, PA-C   Procedure details:    Indications: poor IV access     Location:  Right forearm   Angiocath:  18 G   Bedside Ultrasound Guided: Yes     Patient tolerated procedure without complications: Yes     Dressing applied: Yes   .Critical Care  Performed by: Olene Floss, PA-C Authorized by: Olene Floss, PA-C   Critical care provider statement:    Critical care time (minutes):  45   Critical care was necessary to treat or prevent imminent or life-threatening deterioration of the following conditions:  Endocrine crisis (HHS)   Critical care was time spent personally by me on the following activities:  Development of treatment plan with patient or surrogate, discussions with consultants, evaluation  of patient's response to treatment, examination of patient, ordering and review of laboratory studies, ordering and review of radiographic studies, ordering and performing treatments and interventions, pulse oximetry, re-evaluation of patient's condition and review of old charts     Medications Ordered in ED Medications  sodium chloride 0.9 % bolus 1,000 mL (0 mLs Intravenous Stopped 07/01/23 2218)  insulin aspart (novoLOG) injection 10 Units (10 Units Intravenous Given 07/01/23 2157)  ED Course/ Medical Decision Making/ A&P Clinical Course as of 07/01/23 2238  Tue Jul 01, 2023  2155 No anion gap, beta hydroxybutyrate is mildly elevated at 0.37, potassium 5.2, sodium of 129 in context of glucose that is greater than 700.  I do think reasonable to administer fluid bolus, and IV insulin, with close recheck of CBG, as patient is reticent for admission, no evidence of acidosis, VBG with normal pH, 7.35. [CP]    Clinical Course User Index [CP] Olene Floss, PA-C                                 Medical Decision Making Amount and/or Complexity of Data Reviewed Labs: ordered.   This patient is a 37 y.o. male  who presents to the ED for concern of hyperglycemia, and that there was Thursdays alible to do this for him.,  Nausea, vomiting.   Differential diagnoses prior to evaluation: The emergent differential diagnosis includes, but is not limited to, DKA, HHS, simple hyperglycemia, intra-abdominal abnormality, gastritis, gastroenteritis, versus other. This is not an exhaustive differential.   Past Medical History / Co-morbidities / Social History: Substance-induced mood disorder, hypertension, diabetes, history of HHS, tobacco abuse, previously seen in the hospital for acute metabolic encephalopathy  Additional history: Chart reviewed. Pertinent results include: reviewed lab work, imaging from previous emergency department visits, patient has been given multiple previous admissions  for hyperglycemia, notably.  Was admitted on 11/29 and left prior to completing treatment, seen on 10/15 for hypoglycemia and admitted as well  Physical Exam: Physical exam performed. The pertinent findings include: Dry mucous membranes, mild tachycardia on arrival, pulse of 101, blood pressure 159/115, vital signs otherwise stable, as discussed above patient somewhat anxious, erratic but not responding to internal stimuli, denies any SI, HI, or AVH  Lab Tests/Imaging studies: I personally interpreted labs/imaging and the pertinent results include: Critical hyperglycemia without acidosis or anion gap, glucose greater than 700 on initial CMP, pseudohyponatremia sodium 129, potassium elevated 5.2.  UA with greater than 500 glucose but no evidence of infection, his beta hydroxybutyrate acid is elevated at 0.37, CBC unremarkable.  No acidosis on VBG.  Recheck CBG prior to patient leaving AGAINST MEDICAL ADVICE 566 after receiving around 10 minutes of fluid bolus and 10 units of IV insulin.   Spoke extensively to the patient about the importance of staying for medical treatment, fluids, insulin to treat his critical hyperglycemia and prevent him from going into DKA, or having other complications related to severely elevated blood sugar, patient refused to stay for further evaluation and left after completing 10 minutes of fluid bolus and receiving insulin.  Final Clinical Impression(s) / ED Diagnoses Final diagnoses:  Hyperosmolar hyperglycemic state (HHS) (HCC)    Rx / DC Orders ED Discharge Orders          Ordered    insulin aspart (NOVOLOG) 100 UNIT/ML FlexPen  3 times daily with meals        07/01/23 2226    insulin glargine-yfgn (SEMGLEE) 100 UNIT/ML Pen  Daily at bedtime       Note to Pharmacy: May substitute as needed per insurance.   07/01/23 2226              Olene Floss, PA-C 07/01/23 2239    Virgina Norfolk, DO 07/01/23 2246

## 2023-07-01 NOTE — ED Notes (Signed)
Attempted 2x for blood draws, unsuccessful.

## 2023-07-01 NOTE — ED Notes (Signed)
Patient encouraged to stay but refused and left. He allowed me to take his blood sugar before he went. It was lower after 10 units of IV insulin and about 400 mL of NS

## 2023-07-03 ENCOUNTER — Other Ambulatory Visit: Payer: Self-pay

## 2023-07-03 ENCOUNTER — Encounter (HOSPITAL_COMMUNITY): Payer: Self-pay

## 2023-07-03 ENCOUNTER — Inpatient Hospital Stay (HOSPITAL_COMMUNITY)
Admission: EM | Admit: 2023-07-03 | Discharge: 2023-07-05 | DRG: 638 | Disposition: A | Discharge status: Home / Self Care | Payer: No Typology Code available for payment source | Attending: Family Medicine | Admitting: Family Medicine

## 2023-07-03 DIAGNOSIS — E1165 Type 2 diabetes mellitus with hyperglycemia: Secondary | ICD-10-CM | POA: Diagnosis present

## 2023-07-03 DIAGNOSIS — I10 Essential (primary) hypertension: Secondary | ICD-10-CM | POA: Diagnosis present

## 2023-07-03 DIAGNOSIS — T383X6A Underdosing of insulin and oral hypoglycemic [antidiabetic] drugs, initial encounter: Secondary | ICD-10-CM | POA: Diagnosis present

## 2023-07-03 DIAGNOSIS — Z79899 Other long term (current) drug therapy: Secondary | ICD-10-CM

## 2023-07-03 DIAGNOSIS — F1910 Other psychoactive substance abuse, uncomplicated: Secondary | ICD-10-CM | POA: Diagnosis present

## 2023-07-03 DIAGNOSIS — Z794 Long term (current) use of insulin: Secondary | ICD-10-CM | POA: Diagnosis not present

## 2023-07-03 DIAGNOSIS — Z91148 Patient's other noncompliance with medication regimen for other reason: Secondary | ICD-10-CM | POA: Diagnosis not present

## 2023-07-03 DIAGNOSIS — E119 Type 2 diabetes mellitus without complications: Secondary | ICD-10-CM

## 2023-07-03 DIAGNOSIS — E875 Hyperkalemia: Secondary | ICD-10-CM | POA: Diagnosis present

## 2023-07-03 DIAGNOSIS — E1100 Type 2 diabetes mellitus with hyperosmolarity without nonketotic hyperglycemic-hyperosmolar coma (NKHHC): Secondary | ICD-10-CM | POA: Diagnosis present

## 2023-07-03 DIAGNOSIS — N179 Acute kidney failure, unspecified: Secondary | ICD-10-CM | POA: Diagnosis present

## 2023-07-03 DIAGNOSIS — E785 Hyperlipidemia, unspecified: Secondary | ICD-10-CM | POA: Diagnosis present

## 2023-07-03 DIAGNOSIS — Z9119 Patient's noncompliance with other medical treatment and regimen due to financial hardship: Secondary | ICD-10-CM

## 2023-07-03 LAB — MRSA NEXT GEN BY PCR, NASAL: MRSA by PCR Next Gen: DETECTED — AB

## 2023-07-03 LAB — URINALYSIS, ROUTINE W REFLEX MICROSCOPIC
Bilirubin Urine: NEGATIVE
Glucose, UA: >=500 mg/dL — AB
Hgb urine dipstick: NEGATIVE
Ketones, ur: NEGATIVE mg/dL
Leukocytes,Ua: NEGATIVE
Nitrite: NEGATIVE
Protein, ur: NEGATIVE mg/dL
Specific Gravity, Urine: 1.023 (ref 1.005–1.030)
pH: 6 (ref 5.0–8.0)

## 2023-07-03 LAB — CBC WITH DIFFERENTIAL/PLATELET
Abs Immature Granulocytes: 0.01 10*3/uL (ref 0.00–0.07)
Basophils Absolute: 0 10*3/uL (ref 0.0–0.1)
Basophils Relative: 1 %
Eosinophils Absolute: 0.1 10*3/uL (ref 0.0–0.5)
Eosinophils Relative: 2 %
HCT: 48.5 % (ref 39.0–52.0)
Hemoglobin: 16.2 g/dL (ref 13.0–17.0)
Immature Granulocytes: 0 %
Lymphocytes Relative: 33 %
Lymphs Abs: 1.5 10*3/uL (ref 0.7–4.0)
MCH: 28.3 pg (ref 26.0–34.0)
MCHC: 33.4 g/dL (ref 30.0–36.0)
MCV: 84.8 fL (ref 80.0–100.0)
Monocytes Absolute: 0.5 10*3/uL (ref 0.1–1.0)
Monocytes Relative: 10 %
Neutro Abs: 2.4 10*3/uL (ref 1.7–7.7)
Neutrophils Relative %: 54 %
Platelets: 224 10*3/uL (ref 150–400)
RBC: 5.72 MIL/uL (ref 4.22–5.81)
RDW: 12.5 % (ref 11.5–15.5)
WBC: 4.5 10*3/uL (ref 4.0–10.5)
nRBC: 0 % (ref 0.0–0.2)

## 2023-07-03 LAB — COMPREHENSIVE METABOLIC PANEL
ALT: 17 U/L (ref 0–44)
AST: 14 U/L — ABNORMAL LOW (ref 15–41)
Albumin: 4.8 g/dL (ref 3.5–5.0)
Alkaline Phosphatase: 91 U/L (ref 38–126)
Anion gap: 11 (ref 5–15)
BUN: 28 mg/dL — ABNORMAL HIGH (ref 6–20)
CO2: 29 mmol/L (ref 22–32)
Calcium: 10.3 mg/dL (ref 8.9–10.3)
Chloride: 81 mmol/L — ABNORMAL LOW (ref 98–111)
Creatinine, Ser: 1.61 mg/dL — ABNORMAL HIGH (ref 0.61–1.24)
GFR, Estimated: 56 mL/min — ABNORMAL LOW (ref >60)
Glucose, Bld: 1126 mg/dL (ref 70–99)
Potassium: >7.5 mmol/L (ref 3.5–5.1)
Sodium: 121 mmol/L — ABNORMAL LOW (ref 135–145)
Total Bilirubin: 1.2 mg/dL — ABNORMAL HIGH (ref <1.2)
Total Protein: 8.7 g/dL — ABNORMAL HIGH (ref 6.5–8.1)

## 2023-07-03 LAB — CBG MONITORING, ED
Glucose-Capillary: >600 mg/dL (ref 70–99)
Glucose-Capillary: >600 mg/dL (ref 70–99)
Glucose-Capillary: 361 mg/dL — ABNORMAL HIGH (ref 70–99)
Glucose-Capillary: >600 mg/dL (ref 70–99)
Glucose-Capillary: 228 mg/dL — ABNORMAL HIGH (ref 70–99)
Glucose-Capillary: 285 mg/dL — ABNORMAL HIGH (ref 70–99)
Glucose-Capillary: 294 mg/dL — ABNORMAL HIGH (ref 70–99)

## 2023-07-03 LAB — GLUCOSE, CAPILLARY
Glucose-Capillary: 100 mg/dL — ABNORMAL HIGH (ref 70–99)
Glucose-Capillary: 125 mg/dL — ABNORMAL HIGH (ref 70–99)
Glucose-Capillary: 130 mg/dL — ABNORMAL HIGH (ref 70–99)
Glucose-Capillary: 131 mg/dL — ABNORMAL HIGH (ref 70–99)
Glucose-Capillary: 141 mg/dL — ABNORMAL HIGH (ref 70–99)
Glucose-Capillary: 152 mg/dL — ABNORMAL HIGH (ref 70–99)
Glucose-Capillary: 166 mg/dL — ABNORMAL HIGH (ref 70–99)
Glucose-Capillary: 72 mg/dL (ref 70–99)

## 2023-07-03 LAB — BASIC METABOLIC PANEL
Anion gap: 11 (ref 5–15)
Anion gap: 11 (ref 5–15)
Anion gap: 9 (ref 5–15)
BUN: 27 mg/dL — ABNORMAL HIGH (ref 6–20)
BUN: 23 mg/dL — ABNORMAL HIGH (ref 6–20)
BUN: 21 mg/dL — ABNORMAL HIGH (ref 6–20)
CO2: 28 mmol/L (ref 22–32)
CO2: 23 mmol/L (ref 22–32)
CO2: 25 mmol/L (ref 22–32)
Calcium: 9.5 mg/dL (ref 8.9–10.3)
Calcium: 9.8 mg/dL (ref 8.9–10.3)
Calcium: 9.1 mg/dL (ref 8.9–10.3)
Chloride: 87 mmol/L — ABNORMAL LOW (ref 98–111)
Chloride: 102 mmol/L (ref 98–111)
Chloride: 101 mmol/L (ref 98–111)
Creatinine, Ser: 1.41 mg/dL — ABNORMAL HIGH (ref 0.61–1.24)
Creatinine, Ser: 1.01 mg/dL (ref 0.61–1.24)
Creatinine, Ser: 0.96 mg/dL (ref 0.61–1.24)
GFR, Estimated: >60 mL/min (ref >60)
GFR, Estimated: >60 mL/min (ref >60)
GFR, Estimated: >60 mL/min (ref >60)
Glucose, Bld: 901 mg/dL (ref 70–99)
Glucose, Bld: 80 mg/dL (ref 70–99)
Glucose, Bld: 128 mg/dL — ABNORMAL HIGH (ref 70–99)
Potassium: 4.6 mmol/L (ref 3.5–5.1)
Potassium: 3.1 mmol/L — ABNORMAL LOW (ref 3.5–5.1)
Potassium: 3.4 mmol/L — ABNORMAL LOW (ref 3.5–5.1)
Sodium: 126 mmol/L — ABNORMAL LOW (ref 135–145)
Sodium: 136 mmol/L (ref 135–145)
Sodium: 135 mmol/L (ref 135–145)

## 2023-07-03 LAB — BLOOD GAS, VENOUS
Acid-Base Excess: 6.1 mmol/L — ABNORMAL HIGH (ref 0.0–2.0)
Bicarbonate: 34.5 mmol/L — ABNORMAL HIGH (ref 20.0–28.0)
O2 Saturation: 38 %
Patient temperature: 37
pCO2, Ven: 67 mm[Hg] — ABNORMAL HIGH (ref 44–60)
pH, Ven: 7.32 (ref 7.25–7.43)
pO2, Ven: <31 mm[Hg] — CL (ref 32–45)

## 2023-07-03 MED ORDER — INSULIN REGULAR(HUMAN) IN NACL 100-0.9 UT/100ML-% IV SOLN
INTRAVENOUS | Status: DC
  Administered 2023-07-03: 10.5 [IU]/h via INTRAVENOUS
  Filled 2023-07-03: qty 100

## 2023-07-03 MED ORDER — ONDANSETRON HCL 4 MG/2ML IJ SOLN: 4.0000 mg | Freq: Four times a day (QID) | INTRAMUSCULAR | Status: DC | PRN

## 2023-07-03 MED ORDER — CALCIUM GLUCONATE-NACL 1-0.675 GM/50ML-% IV SOLN
1.0000 g | Freq: Once | INTRAVENOUS | Status: AC
  Administered 2023-07-03: 1000 mg via INTRAVENOUS
  Filled 2023-07-03: qty 50

## 2023-07-03 MED ORDER — ORAL CARE MOUTH RINSE: 15.0000 mL | OROMUCOSAL | Status: DC | PRN

## 2023-07-03 MED ORDER — INSULIN REGULAR(HUMAN) IN NACL 100-0.9 UT/100ML-% IV SOLN: INTRAVENOUS | Status: DC

## 2023-07-03 MED ORDER — BACITRACIN ZINC 500 UNIT/GM EX OINT
TOPICAL_OINTMENT | Freq: Two times a day (BID) | CUTANEOUS | Status: DC
  Filled 2023-07-03: qty 0.9

## 2023-07-03 MED ORDER — ACETAMINOPHEN 325 MG PO TABS: 650.0000 mg | ORAL_TABLET | Freq: Four times a day (QID) | ORAL | Status: DC | PRN

## 2023-07-03 MED ORDER — ACETAMINOPHEN 650 MG RE SUPP: 650.0000 mg | Freq: Four times a day (QID) | RECTAL | Status: DC | PRN

## 2023-07-03 MED ORDER — ENOXAPARIN SODIUM 40 MG/0.4ML IJ SOSY
40.0000 mg | PREFILLED_SYRINGE | INTRAMUSCULAR | Status: DC
Start: 2023-07-03 — End: 2023-07-05
  Administered 2023-07-03 – 2023-07-05 (×3): 40 mg via SUBCUTANEOUS
  Filled 2023-07-03 (×3): qty 0.4

## 2023-07-03 MED ORDER — DEXTROSE 50 % IV SOLN
0.0000 mL | INTRAVENOUS | Status: DC | PRN
Start: 2023-07-03 — End: 2023-07-05

## 2023-07-03 MED ORDER — ALBUTEROL SULFATE (2.5 MG/3ML) 0.083% IN NEBU: 2.5000 mg | INHALATION_SOLUTION | RESPIRATORY_TRACT | Status: DC | PRN

## 2023-07-03 MED ORDER — TRAZODONE HCL 50 MG PO TABS: 25.0000 mg | ORAL_TABLET | Freq: Every evening | ORAL | Status: DC | PRN

## 2023-07-03 MED ORDER — CHLORHEXIDINE GLUCONATE CLOTH 2 % EX PADS
6.0000 | MEDICATED_PAD | Freq: Every day | CUTANEOUS | Status: DC
  Administered 2023-07-03 – 2023-07-05 (×2): 6 via TOPICAL

## 2023-07-03 MED ORDER — MUPIROCIN 2 % EX OINT
1.0000 | TOPICAL_OINTMENT | Freq: Two times a day (BID) | CUTANEOUS | Status: DC
  Administered 2023-07-03 – 2023-07-05 (×4): 1 via NASAL
  Filled 2023-07-03 (×2): qty 22

## 2023-07-03 MED ORDER — DEXTROSE IN LACTATED RINGERS 5 % IV SOLN: INTRAVENOUS | Status: DC

## 2023-07-03 MED ORDER — LACTATED RINGERS IV BOLUS: 2000.0000 mL | Freq: Once | INTRAVENOUS | Status: DC

## 2023-07-03 MED ORDER — POTASSIUM CHLORIDE CRYS ER 20 MEQ PO TBCR
40.0000 meq | EXTENDED_RELEASE_TABLET | Freq: Once | ORAL | Status: AC
  Administered 2023-07-03: 40 meq via ORAL
  Filled 2023-07-03: qty 2

## 2023-07-03 MED ORDER — LACTATED RINGERS IV BOLUS: 1000.0000 mL | Freq: Once | INTRAVENOUS | Status: DC

## 2023-07-03 MED ORDER — SODIUM CHLORIDE 0.9 % IV BOLUS
2000.0000 mL | Freq: Once | INTRAVENOUS | Status: AC
  Administered 2023-07-03: 2000 mL via INTRAVENOUS

## 2023-07-03 MED ORDER — LACTATED RINGERS IV SOLN: INTRAVENOUS | Status: DC

## 2023-07-03 MED ORDER — SODIUM CHLORIDE 0.9 % IV BOLUS
1000.0000 mL | Freq: Once | INTRAVENOUS | Status: AC
  Administered 2023-07-03: 1000 mL via INTRAVENOUS

## 2023-07-03 MED ORDER — ONDANSETRON HCL 4 MG PO TABS: 4.0000 mg | ORAL_TABLET | Freq: Four times a day (QID) | ORAL | Status: DC | PRN

## 2023-07-03 NOTE — ED Notes (Signed)
CRITICAL VALUE STICKER  CRITICAL VALUE: BG PO2 less than 31   RECEIVER (on-site recipient of call): L. Pete Glatter, RN   DATE & TIME NOTIFIED: 07/03/2023 0945  MESSENGER (representative from lab):   MD NOTIFIED: Dr. Adela Lank  TIME OF NOTIFICATION: 07/03/2023 0946  RESPONSE:  Orders received

## 2023-07-03 NOTE — Progress Notes (Signed)
Upon arrival to ICU, patient's CBG 141. Per orders, Endotool changed from DKA to HHS. Per Endotool, insulin drip stopped. Patient then had CBG of 72, MD made aware and instructed to give 1 peanut butter and crackers to patient. CBG rechecked, 100. Per Endotool, restart insulin drip at 0.6. Patient does not want RN to restart Endotool, patient stating he wants to leave AMA. MD made aware.

## 2023-07-03 NOTE — Progress Notes (Signed)
Patient requesting to get his bag from the cabinet, patient attempted to get a bottle of honey out and is asking if he can keep it at bedside in case his blood sugar gets too low. RN educated patient that we can treat hypoglycemia via many routes and he is not allowed to self-treat while he is hospitalized. Patient verbalized understanding. Bag with honey (and other snacks) placed back in cabinet.

## 2023-07-03 NOTE — ED Notes (Signed)
Pt pausing current drips, when asked why he responded "I dont feel good". Pt told not to touch pumps again.

## 2023-07-03 NOTE — ED Provider Notes (Signed)
[place] EMERGENCY DEPARTMENT AT [facility] Provider Note   CSN: [id number] Arrival date & time: 07/03/23  6295     History  Chief Complaint  Patient presents with   Hyperglycemia    [name] [name] is a 37 y.o. male.  37 yo M with a chief complaints of hyperglycemia.  The patient unfortunately cannot afford his insulin he was here a few days ago and was unwilling to wait for help with social work for admission.  He said he is ready to come to the hospital today.  He episode of emesis yesterday but denies persistent vomiting.  Denies chest pain difficulty breathing abdominal pain denies fevers.   Hyperglycemia      Home Medications Prior to Admission medications   Medication Sig Start Date End Date Taking? Authorizing Provider  blood glucose meter kit and supplies KIT Use to check blood sugar three times daily 05/14/23   [name], Deno Lunger, MD  Glucose Blood (BLOOD GLUCOSE TEST STRIPS) STRP Use test strip to check blood sugar three times daily 05/14/23   [name], Deno Lunger, MD  insulin aspart (NOVOLOG) 100 UNIT/ML FlexPen Inject 8 Units into the skin 3 (three) times daily with meals. Only take if eating a meal AND Blood Glucose (BG) is 80 or higher. 07/01/23   Prosperi, Christian H, PA-C  insulin glargine-yfgn (SEMGLEE) 100 UNIT/ML Pen Inject 25 Units into the skin at bedtime. 07/01/23   Prosperi, Christian H, PA-C  Insulin Pen Needle (PEN NEEDLES) 31G X 5 MM MISC Use  three times daily 05/14/23   [name], Deno Lunger, MD  Lancet Device MISC 1 each by Does not apply route 3 (three) times daily. May dispense any manufacturer covered by patient's insurance. 05/14/23   [name], Deno Lunger, MD  Lancets MISC Use to check blood sugar three times daily 05/14/23   [name], Deno Lunger, MD  lisinopril (ZESTRIL) 20 MG tablet Take 1 tablet (20 mg total) by mouth daily. 05/14/23 07/01/23  [name], Deno Lunger, MD      Allergies    Patient has no known allergies.    Review of Systems    Review of Systems  Physical Exam Updated Vital Signs BP (!) 166/100   Pulse 93   Temp 97.6 F (36.4 C) (Oral)   Resp 11   Ht 5\' 11"  (1.803 m)   Wt 93.9 kg   SpO2 98%   BMI 28.87 kg/m  Physical Exam Vitals and nursing note reviewed.  Constitutional:      Appearance: He is well-developed.  HENT:     Head: Normocephalic and atraumatic.  Eyes:     Pupils: Pupils are equal, round, and reactive to light.  Neck:     Vascular: No JVD.  Cardiovascular:     Rate and Rhythm: Normal rate and regular rhythm.     Heart sounds: No murmur heard.    No friction rub. No gallop.  Pulmonary:     Effort: No respiratory distress.     Breath sounds: No wheezing.  Abdominal:     General: There is no distension.     Tenderness: There is no abdominal tenderness. There is no guarding or rebound.  Musculoskeletal:        General: Normal range of motion.     Cervical back: Normal range of motion and neck supple.  Skin:    Coloration: Skin is not pale.     Findings: No rash.  Neurological:     Mental Status: He is alert  and oriented to person, place, and time.  Psychiatric:        Behavior: Behavior normal.     ED Results / Procedures / Treatments   Labs (all labs ordered are listed, but only abnormal results are displayed) Labs Reviewed  COMPREHENSIVE METABOLIC PANEL - Abnormal; Notable for the following components:      Result Value   Sodium 121 (*)    Potassium >7.5 (*)    Chloride 81 (*)    Glucose, Bld 1,126 (*)    BUN 28 (*)    Creatinine, Ser 1.61 (*)    Total Protein 8.7 (*)    AST 14 (*)    Total Bilirubin 1.2 (*)    GFR, Estimated 56 (*)    All other components within normal limits  URINALYSIS, ROUTINE W REFLEX MICROSCOPIC - Abnormal; Notable for the following components:   Color, Urine COLORLESS (*)    Glucose, UA >=500 (*)    All other components within normal limits  BLOOD GAS, VENOUS - Abnormal; Notable for the following components:   pCO2, Ven 67 (*)    pO2,  Ven <31 (*)    Bicarbonate 34.5 (*)    Acid-Base Excess 6.1 (*)    All other components within normal limits  CBG MONITORING, ED - Abnormal; Notable for the following components:   Glucose-Capillary >600 (*)    All other components within normal limits  CBG MONITORING, ED - Abnormal; Notable for the following components:   Glucose-Capillary >600 (*)    All other components within normal limits  CBC WITH DIFFERENTIAL/PLATELET  BASIC METABOLIC PANEL  BASIC METABOLIC PANEL  BASIC METABOLIC PANEL  BASIC METABOLIC PANEL  POTASSIUM  BASIC METABOLIC PANEL    EKG EKG Interpretation Date/Time:  Thursday July 03 2023 10:25:33 EST Ventricular Rate:  71 PR Interval:  135 QRS Duration:  122 QT Interval:  400 QTC Calculation: 435 R Axis:   -3  Text Interpretation: Sinus rhythm IVCD, consider atypical RBBB No significant change since last tracing Confirmed by Melene Plan [phone]) on 07/03/2023 10:27:33 AM  Radiology No results found.  Procedures .Critical Care  Performed by: Melene Plan, DO Authorized by: Melene Plan, DO   Critical care provider statement:    Critical care time (minutes):  35   Critical care time was exclusive of:  Separately billable procedures and treating other patients   Critical care was time spent personally by me on the following activities:  Development of treatment plan with patient or surrogate, discussions with consultants, evaluation of patient's response to treatment, examination of patient, ordering and review of laboratory studies, ordering and review of radiographic studies, ordering and performing treatments and interventions, pulse oximetry, re-evaluation of patient's condition and review of old charts   Care discussed with: admitting provider       Medications Ordered in ED Medications  insulin regular, human (MYXREDLIN) 100 units/ 100 mL infusion (10.5 Units/hr Intravenous New Bag/Given 07/03/23 1053)  lactated ringers infusion (has no  administration in time range)  dextrose 5 % in lactated ringers infusion (has no administration in time range)  dextrose 50 % solution 0-50 mL (has no administration in time range)  calcium gluconate 1 g/ 50 mL sodium chloride IVPB (1,000 mg Intravenous New Bag/Given 07/03/23 1055)  enoxaparin (LOVENOX) injection 40 mg (has no administration in time range)  acetaminophen (TYLENOL) tablet 650 mg (has no administration in time range)    Or  acetaminophen (TYLENOL) suppository 650 mg (has no administration in time  range)  traZODone (DESYREL) tablet 25 mg (has no administration in time range)  ondansetron (ZOFRAN) tablet 4 mg (has no administration in time range)    Or  ondansetron (ZOFRAN) injection 4 mg (has no administration in time range)  albuterol (PROVENTIL) (2.5 MG/3ML) 0.083% nebulizer solution 2.5 mg (has no administration in time range)  sodium chloride 0.9 % bolus 2,000 mL (2,000 mLs Intravenous New Bag/Given 07/03/23 0858)    ED Course/ Medical Decision Making/ A&P Clinical Course as of 07/03/23 1102  Thu Jul 03, 2023  1002 K >7.5, glucose 1126 [MK]    Clinical Course User Index [MK] Glendora Score, MD                                 Medical Decision Making Amount and/or Complexity of Data Reviewed Labs: ordered.  Risk Prescription drug management. Decision regarding hospitalization.   37 yo M with a chief complaints of hyperglycemia.  Patient was just seen about 48 hours ago and is found to have significantly elevated blood sugar.  He at that time was unwilling to wait for assistance.  He is not been able to use his insulin because he cannot afford it.  Patient has hyperglycemia with a mild AKI and a potassium measured greater than 7.5.  He has no new EKG changes which make me wonder if it spurious.  Regardless he was started on insulin infusion and he was given 2 L of IV fluids upon arrival.  Will give calcium while waiting recheck.  Will discuss with medicine for  admission.  Patient is not in diabetic ketoacidosis no metabolic acidosis no anion gap.  pH is 7 3.  Likely HHS.   The patients results and plan were reviewed and discussed.   Any x-rays performed were independently reviewed by myself.   Differential diagnosis were considered with the presenting HPI.  Medications  insulin regular, human (MYXREDLIN) 100 units/ 100 mL infusion (10.5 Units/hr Intravenous New Bag/Given 07/03/23 1053)  lactated ringers infusion (has no administration in time range)  dextrose 5 % in lactated ringers infusion (has no administration in time range)  dextrose 50 % solution 0-50 mL (has no administration in time range)  calcium gluconate 1 g/ 50 mL sodium chloride IVPB (1,000 mg Intravenous New Bag/Given 07/03/23 1055)  enoxaparin (LOVENOX) injection 40 mg (has no administration in time range)  acetaminophen (TYLENOL) tablet 650 mg (has no administration in time range)    Or  acetaminophen (TYLENOL) suppository 650 mg (has no administration in time range)  traZODone (DESYREL) tablet 25 mg (has no administration in time range)  ondansetron (ZOFRAN) tablet 4 mg (has no administration in time range)    Or  ondansetron (ZOFRAN) injection 4 mg (has no administration in time range)  albuterol (PROVENTIL) (2.5 MG/3ML) 0.083% nebulizer solution 2.5 mg (has no administration in time range)  sodium chloride 0.9 % bolus 2,000 mL (2,000 mLs Intravenous New Bag/Given 07/03/23 0858)    Vitals:   07/03/23 0749  BP: (!) 166/100  Pulse: 93  Resp: 11  Temp: 97.6 F (36.4 C)  TempSrc: Oral  SpO2: 98%  Weight: 93.9 kg  Height: 5\' 11"  (1.803 m)    Final diagnoses:  Hyperosmolar hyperglycemic state (HHS) (HCC)    Admission/ observation were discussed with the admitting physician, patient and/or family and they are comfortable with the plan.          Final Clinical Impression(s) /  ED Diagnoses Final diagnoses:  Hyperosmolar hyperglycemic state (HHS) [facility])     Rx / DC Orders ED Discharge Orders     None         Melene Plan, DO 07/03/23 1102

## 2023-07-03 NOTE — Inpatient Diabetes Management (Signed)
Inpatient Diabetes Program Recommendations  AACE/ADA: New Consensus Statement on Inpatient Glycemic Control (2015)  Target Ranges:  Prepandial:   less than 140 mg/dL      Peak postprandial:   less than 180 mg/dL (1-2 hours)      Critically ill patients:  140 - 180 mg/dL   Lab Results  Component Value Date   GLUCAP 228 (H) 07/03/2023   HGBA1C 12.4 (H) 04/20/2023    Review of Glycemic Control  Diabetes history: DM2 Outpatient Diabetes medications: Semglee 25 at bedtime, Novolog 8 units TID Current orders for Inpatient glycemic control: IV insulin per EndoTool for HHS  HgbA1C 12.4%  Inpatient Diabetes Program Recommendations:    Well-known to inpatient diabetes team due to multiple ED visits and hospital admissions. Last ED visit 2 days ago and pt left AMA.  When criteria met for discontinuation of drip, give Semglee 1-2 hours prior to d/cing the insulin drip.  Semglee 20 units daily Novolog 0-9 units TID with meals and 0-5 HS Novolog 4 units TID with meals.  Will follow and make recommendations if needed based on glucose trends.  Thank you. Ailene Ards, RD, LDN, CDCES Inpatient Diabetes Coordinator [phone]

## 2023-07-03 NOTE — ED Notes (Signed)
Endo tool recommends to continue current rate of 10.5 units/hr. Will recheck at 1200.

## 2023-07-03 NOTE — ED Triage Notes (Signed)
Per EMS, Pt coming in today for hyperglycemia. Sugar with EMS was 460. Pt also reports a burning in his chest, says this normally happens when his blood sugar is high. Pt does not have access to insulin outside hospital.

## 2023-07-03 NOTE — Progress Notes (Signed)
Patient states he would like to stay and continue treatment. D5LR and insulin running. Vital signs stable.

## 2023-07-03 NOTE — ED Notes (Signed)
ED TO INPATIENT HANDOFF REPORT  ED Nurse Name and Phone #:   L. Pete Glatter, RN  S Name/Age/Gender [name] [name] 37 y.o. male Room/Bed: WA12/WA12  Code Status   Code Status: Full Code  Home/SNF/Other Home Patient oriented to: self, place, time, and situation Is this baseline? Yes   Triage Complete: Triage complete  Chief Complaint Hyperosmolar hyperglycemic state (HHS) (HCC) [E11.00]  Triage Note Per EMS, Pt coming in today for hyperglycemia. Sugar with EMS was 460. Pt also reports a burning in his chest, says this normally happens when his blood sugar is high. Pt does not have access to insulin outside hospital.    Allergies No Known Allergies  Level of Care/Admitting Diagnosis ED Disposition     ED Disposition  Admit   Condition  --   Comment  Hospital Area: [facility] [[id number]]  Level of Care: Stepdown [14]  Admit to SDU based on following criteria: Severe physiological/psychological symptoms:  Any diagnosis requiring assessment & intervention at least every 4 hours on an ongoing basis to obtain desired patient outcomes including stability and rehabilitation  May admit patient to [name] if equivalent level of care is available:: Yes  Covid Evaluation: Asymptomatic - no recent exposure (last 10 days) testing not required  Diagnosis: Hyperosmolar hyperglycemic state (HHS) [facility]) [[id number]]  Admitting Physician: Maryln Gottron [[id number]]  Attending Physician: [facility], MIR [email or web address] [[id number]]  Certification:: I certify this patient will need inpatient services for at least 2 midnights  Expected Medical Readiness: 07/05/2023          B Medical/Surgery History Past Medical History:  Diagnosis Date   Diabetes mellitus without complication (HCC)    DKA (diabetic ketoacidosis) (HCC) 05/08/2022   HTN (hypertension) 05/08/2022   Hyperlipidemia    Hyperosmolar hyperglycemic state (HHS) (HCC) 05/08/2022   Hypertension    Hypoglycemia  07/02/2019   Pseudohyponatremia 05/08/2022   Past Surgical History:  Procedure Laterality Date   HAND SURGERY       A IV Location/Drains/Wounds Patient Lines/Drains/Airways Status     Active Line/Drains/Airways     Name Placement date Placement time Site Days   Peripheral IV 07/01/23 20 G 1.75" Anterior;Right Forearm 07/01/23  2134  Forearm  2   Peripheral IV 07/03/23 20 G 2.5" Left Antecubital 07/03/23  0858  Antecubital  less than 1   Peripheral IV 07/03/23 20 G 2.5" Right Antecubital 07/03/23  1054  Antecubital  less than 1            Intake/Output Last 24 hours  Intake/Output Summary (Last 24 hours) at 07/03/2023 1523 Last data filed at 07/03/2023 1315 Gross per 24 hour  Intake 2100 ml  Output --  Net 2100 ml    Labs/Imaging Results for orders placed [name] performed during the hospital encounter of 07/03/23 (from the past 48 hour(s))  Urinalysis, Routine w reflex microscopic -Urine, Clean Catch     Status: Abnormal   Collection Time: 07/03/23  8:11 AM  Result Value Ref Range   Color, Urine COLORLESS (A) YELLOW   APPearance CLEAR CLEAR   Specific Gravity, Urine 1.023 1.005 - 1.030   pH 6.0 5.0 - 8.0   Glucose, UA >=500 (A) NEGATIVE mg/dL   Hgb urine dipstick NEGATIVE NEGATIVE   Bilirubin Urine NEGATIVE NEGATIVE   Ketones, ur NEGATIVE NEGATIVE mg/dL   Protein, ur NEGATIVE NEGATIVE mg/dL   Nitrite NEGATIVE NEGATIVE   Leukocytes,Ua NEGATIVE NEGATIVE    Comment: Performed at [facility], [address] [place], [place] [address]  CBG monitoring, ED     Status: Abnormal   Collection Time: 07/03/23  8:40 AM  Result Value Ref Range   Glucose-Capillary >600 (HH) 70 - 99 mg/dL    Comment: Glucose reference range applies only to samples taken after fasting for at least 8 hours.  CBC with Differential     Status: None   Collection Time: 07/03/23  8:55 AM  Result Value Ref Range   WBC 4.5 4.0 - 10.5 K/uL   RBC 5.72 4.22 - 5.81 MIL/uL   Hemoglobin  16.2 13.0 - 17.0 g/dL   HCT 66.0 63.0 - 16.0 %   MCV 84.8 80.0 - 100.0 fL   MCH 28.3 26.0 - 34.0 pg   MCHC 33.4 30.0 - 36.0 g/dL   RDW 10.9 32.3 - 55.7 %   Platelets 224 150 - 400 K/uL   nRBC 0.0 0.0 - 0.2 %   Neutrophils Relative % 54 %   Neutro Abs 2.4 1.7 - 7.7 K/uL   Lymphocytes Relative 33 %   Lymphs Abs 1.5 0.7 - 4.0 K/uL   Monocytes Relative 10 %   Monocytes Absolute 0.5 0.1 - 1.0 K/uL   Eosinophils Relative 2 %   Eosinophils Absolute 0.1 0.0 - 0.5 K/uL   Basophils Relative 1 %   Basophils Absolute 0.0 0.0 - 0.1 K/uL   Immature Granulocytes 0 %   Abs Immature Granulocytes 0.01 0.00 - 0.07 K/uL    Comment: Performed at [facility], [address], [place], [place] [address]  Comprehensive metabolic panel     Status: Abnormal   Collection Time: 07/03/23  8:55 AM  Result Value Ref Range   Sodium 121 (L) 135 - 145 mmol/L    Comment: DELTA CHECK NOTED   Potassium >7.5 (HH) 3.5 - 5.1 mmol/L    Comment: CRITICAL RESULT CALLED TO, READ BACK BY AND VERIFIED WITH Seanpatrick Maisano,L. RN AT 1001 07/03/23 MULLINS,T NO VISIBLE HEMOLYSIS    Chloride 81 (L) 98 - 111 mmol/L   CO2 29 22 - 32 mmol/L   Glucose, Bld 1,126 (HH) 70 - 99 mg/dL    Comment: CRITICAL RESULT CALLED TO, READ BACK BY AND VERIFIED WITH Genoveva Singleton,L. RN AT 1001 07/03/23 MULLINS,T Glucose reference range applies only to samples taken after fasting for at least 8 hours.    BUN 28 (H) 6 - 20 mg/dL   Creatinine, Ser 5.42 (H) 0.61 - 1.24 mg/dL   Calcium 70.6 8.9 - 23.7 mg/dL   Total Protein 8.7 (H) 6.5 - 8.1 g/dL   Albumin 4.8 3.5 - 5.0 g/dL   AST 14 (L) 15 - 41 U/L   ALT 17 0 - 44 U/L   Alkaline Phosphatase 91 38 - 126 U/L   Total Bilirubin 1.2 (H) <1.2 mg/dL   GFR, Estimated 56 (L) >60 mL/min    Comment: (NOTE) Calculated using the CKD-EPI Creatinine Equation (2021)    Anion gap 11 5 - 15    Comment: Performed at [facility], [address], [place], [place] [address]  Blood gas,  venous     Status: Abnormal   Collection Time: 07/03/23  9:00 AM  Result Value Ref Range   pH, Ven 7.32 7.25 - 7.43   pCO2, Ven 67 (H) 44 - 60 mmHg   pO2, Ven <31 (LL) 32 - 45 mmHg    Comment: CRITICAL RESULT CALLED TO, READ BACK BY AND VERIFIED WITH: De Jaworski,L. RN  AT 0941 07/03/23 MULLINS,T    Bicarbonate 34.5 (H) 20.0 - 28.0 mmol/L   Acid-Base Excess 6.1 (H) 0.0 - 2.0 mmol/L   O2 Saturation 38 %   Patient temperature 37.0     Comment: Performed at [facility], [address] [place], [place] [address]  CBG monitoring, ED     Status: Abnormal   Collection Time: 07/03/23 10:44 AM  Result Value Ref Range   Glucose-Capillary >600 (HH) 70 - 99 mg/dL    Comment: Glucose reference range applies only to samples taken after fasting for at least 8 hours.  Basic metabolic panel     Status: Abnormal   Collection Time: 07/03/23 10:46 AM  Result Value Ref Range   Sodium 126 (L) 135 - 145 mmol/L   Potassium 4.6 3.5 - 5.1 mmol/L   Chloride 87 (L) 98 - 111 mmol/L   CO2 28 22 - 32 mmol/L   Glucose, Bld 901 (HH) 70 - 99 mg/dL    Comment: CRITICAL RESULT CALLED TO, READ BACK BY AND VERIFIED WITH H. Willford Rabideau RN ON 07/03/2023 AT 1143 BY SL Glucose reference range applies only to samples taken after fasting for at least 8 hours.    BUN 27 (H) 6 - 20 mg/dL   Creatinine, Ser 9.14 (H) 0.61 - 1.24 mg/dL   Calcium 9.5 8.9 - 78.2 mg/dL   GFR, Estimated >95 >62 mL/min    Comment: (NOTE) Calculated using the CKD-EPI Creatinine Equation (2021)    Anion gap 11 5 - 15    Comment: Performed at [facility], [address] [place], [place] [address]  CBG monitoring, ED     Status: Abnormal   Collection Time: 07/03/23 11:30 AM  Result Value Ref Range   Glucose-Capillary >600 (HH) 70 - 99 mg/dL    Comment: Glucose reference range applies only to samples taken after fasting for at least 8 hours.  CBG monitoring, ED     Status: Abnormal   Collection Time: 07/03/23 12:01  PM  Result Value Ref Range   Glucose-Capillary 361 (H) 70 - 99 mg/dL    Comment: Glucose reference range applies only to samples taken after fasting for at least 8 hours.  CBG monitoring, ED     Status: Abnormal   Collection Time: 07/03/23  1:12 PM  Result Value Ref Range   Glucose-Capillary 228 (H) 70 - 99 mg/dL    Comment: Glucose reference range applies only to samples taken after fasting for at least 8 hours.  CBG monitoring, ED     Status: Abnormal   Collection Time: 07/03/23  2:28 PM  Result Value Ref Range   Glucose-Capillary 285 (H) 70 - 99 mg/dL    Comment: Glucose reference range applies only to samples taken after fasting for at least 8 hours.   No results found.  Pending Labs Unresulted Labs (From admission, onward)     Start     Ordered   07/04/23 0500  CBC  Tomorrow morning,   R        07/03/23 1046   07/03/23 1043  Basic metabolic panel  ONCE - STAT,   STAT        07/03/23 1042   07/03/23 1029  Potassium  Once,   STAT        07/03/23 1028   07/03/23 1004  Basic metabolic panel  (Diabetes Ketoacidosis (DKA))  STAT Now then every 4 hours ,   STAT      07/03/23  1003            Vitals/Pain Today's Vitals   07/03/23 0749 07/03/23 1245 07/03/23 1430  BP: (!) 166/100  (!) 145/92  Pulse: 93  (!) 104  Resp: 11  13  Temp: 97.6 F (36.4 C) 97.7 F (36.5 C)   TempSrc: Oral Oral   SpO2: 98%  99%  Weight: 93.9 kg    Height: 5\' 11"  (1.803 m)    PainSc: 4       Isolation Precautions No active isolations  Medications Medications  lactated ringers infusion ( Intravenous New Bag/Given 07/03/23 1132)  dextrose 5 % in lactated ringers infusion ( Intravenous New Bag/Given 07/03/23 1315)  dextrose 50 % solution 0-50 mL (has no administration in time range)  enoxaparin (LOVENOX) injection 40 mg (40 mg Subcutaneous Given 07/03/23 1316)  acetaminophen (TYLENOL) tablet 650 mg (has no administration in time range)    [name]  acetaminophen (TYLENOL) suppository 650 mg (has  no administration in time range)  traZODone (DESYREL) tablet 25 mg (has no administration in time range)  ondansetron (ZOFRAN) tablet 4 mg (has no administration in time range)    [name]  ondansetron (ZOFRAN) injection 4 mg (has no administration in time range)  albuterol (PROVENTIL) (2.5 MG/3ML) 0.083% nebulizer solution 2.5 mg (has no administration in time range)  insulin regular, human (MYXREDLIN) 100 units/ 100 mL infusion (has no administration in time range)  sodium chloride 0.9 % bolus 2,000 mL (0 mLs Intravenous Stopped 07/03/23 1132)  calcium gluconate 1 g/ 50 mL sodium chloride IVPB (0 mg Intravenous Stopped 07/03/23 1315)    Mobility walks     Focused Assessments Hyperglycemia   R Recommendations: See Admitting Provider Note  Report given to:   Additional Notes:

## 2023-07-03 NOTE — H&P (Addendum)
History and Physical  [name] [name] ZOX:[id number] DOB: 1986-02-06 DOA: 07/03/2023  PCP: [name], NP   Chief Complaint: Peeing a lot, high blood sugar  HPI: [name] [name] is a 37 y.o. male with medical history significant for polysubstance abuse, poorly controlled insulin-dependent type 2 diabetes, multiple admissions for DKA/HHS and leaving AMA from the hospital being admitted to the hospital with HHS.  He recently was admitted to the hospital with HHS, left AMA on 12/1, return to the ER on 12/3 complaining of hyperglycemia, and refused hospital admission on that day and was discharged.  Says that he has not had any insulin since 12/3, felt like his vision was a little blurry, and he was urinating quite a bit so he came to the hospital concerned that he has hyperglycemia.  Workup as detailed below shows evidence of HHS, he has been given 2 L IV fluid, EKG which I personally reviewed does not show any acute changes despite elevated potassium.  He was given IV calcium gluconate.  He is being started on IV insulin drip, and hospitalist was consulted for admission.  Currently he is resting comfortably, denies any pain, or nausea.  Review of Systems: Please see HPI for pertinent positives and negatives. A complete 10 system review of systems are otherwise negative.  Past Medical History:  Diagnosis Date   Diabetes mellitus without complication (HCC)    DKA (diabetic ketoacidosis) (HCC) 05/08/2022   HTN (hypertension) 05/08/2022   Hyperlipidemia    Hyperosmolar hyperglycemic state (HHS) (HCC) 05/08/2022   Hypertension    Hypoglycemia 07/02/2019   Pseudohyponatremia 05/08/2022   Past Surgical History:  Procedure Laterality Date   HAND SURGERY      Social History:  reports that he has never smoked. He has never used smokeless tobacco. He reports current drug use. Drug: Amphetamines. He reports that he does not drink alcohol.   No Known Allergies  Family History  Family history  unknown: Yes     Prior to Admission medications   Medication Sig Start Date End Date Taking? Authorizing Provider  blood glucose meter kit and supplies KIT Use to check blood sugar three times daily 05/14/23   [name], Deno Lunger, MD  Glucose Blood (BLOOD GLUCOSE TEST STRIPS) STRP Use test strip to check blood sugar three times daily 05/14/23   [name], Deno Lunger, MD  insulin aspart (NOVOLOG) 100 UNIT/ML FlexPen Inject 8 Units into the skin 3 (three) times daily with meals. Only take if eating a meal AND Blood Glucose (BG) is 80 or higher. 07/01/23   Prosperi, Christian H, PA-C  insulin glargine-yfgn (SEMGLEE) 100 UNIT/ML Pen Inject 25 Units into the skin at bedtime. 07/01/23   Prosperi, Christian H, PA-C  Insulin Pen Needle (PEN NEEDLES) 31G X 5 MM MISC Use  three times daily 05/14/23   [name], Deno Lunger, MD  Lancet Device MISC 1 each by Does not apply route 3 (three) times daily. May dispense any manufacturer covered by patient's insurance. 05/14/23   [name], Deno Lunger, MD  Lancets MISC Use to check blood sugar three times daily 05/14/23   [name], Deno Lunger, MD  lisinopril (ZESTRIL) 20 MG tablet Take 1 tablet (20 mg total) by mouth daily. 05/14/23 07/01/23  Marinda Elk, MD    Physical Exam: BP (!) 166/100   Pulse 93   Temp 97.6 F (36.4 C) (Oral)   Resp 11   Ht 5\' 11"  (1.803 m)   Wt 93.9 kg   SpO2 98%  BMI 28.87 kg/m   General:  Alert, oriented, calm, in no acute distress, looks comfortable and nontoxic Eyes: EOMI, clear conjuctivae, white sclerea Neck: supple, no masses, trachea mildline  Cardiovascular: RRR, no murmurs or rubs, no peripheral edema  Respiratory: clear to auscultation bilaterally, no wheezes, no crackles  Abdomen: soft, nontender, nondistended, normal bowel tones heard  Skin: dry, no rashes  Musculoskeletal: no joint effusions, normal range of motion  Psychiatric: appropriate affect, normal speech  Neurologic: extraocular muscles intact, clear speech,  moving all extremities with intact sensorium         Labs on Admission:  Basic Metabolic Panel: Recent Labs  Lab 06/27/23 1006 06/27/23 1204 06/27/23 1654 07/01/23 2107 07/01/23 2130 07/03/23 0855  NA 137 135 134* 129* 130*  129* 121*  K 3.5 3.8 3.9 5.2* 5.1  5.1 >7.5*  CL 101 100 97* 92* 95* 81*  CO2 29 27 27 26   --  29  GLUCOSE 283* 335* 337* 700* >700* 1,126*  BUN 17 16 20  24* 30* 28*  CREATININE 0.96 0.90 1.07 1.24 1.10 1.61*  CALCIUM 9.2 9.3 9.6 10.1  --  10.3   Liver Function Tests: Recent Labs  Lab 06/27/23 0013 07/01/23 2107 07/03/23 0855  AST 12* 21 14*  ALT 17 18 17   ALKPHOS 88 67 91  BILITOT 0.5 0.9 1.2*  PROT 8.0 7.7 8.7*  ALBUMIN 4.2 4.1 4.8   Recent Labs  Lab 07/01/23 2107  LIPASE 45   No results for input(s): "AMMONIA" in the last 168 hours. CBC: Recent Labs  Lab 06/27/23 0013 06/27/23 0035 07/01/23 2107 07/01/23 2130 07/03/23 0855  WBC 5.0  --  4.7  --  4.5  NEUTROABS  --   --   --   --  2.4  HGB 15.3 16.7 16.1 17.0  17.0 16.2  HCT 47.0 49.0 48.6 50.0  50.0 48.5  MCV 87.9  --  84.4  --  84.8  PLT 184  --  212  --  224   Cardiac Enzymes: No results for input(s): "CKTOTAL", "CKMB", "CKMBINDEX", "TROPONINI" in the last 168 hours.  BNP (last 3 results) No results for input(s): "BNP" in the last 8760 hours.  ProBNP (last 3 results) No results for input(s): "PROBNP" in the last 8760 hours.  CBG: Recent Labs  Lab 06/29/23 0735 07/01/23 2029 07/01/23 2210 07/03/23 0840 07/03/23 1044  GLUCAP 343* >600* 566* >600* >600*    Radiological Exams on Admission: No results found.  Assessment/Plan [name] [name] is a 37 y.o. male with medical history significant for polysubstance abuse, poorly controlled insulin-dependent type 2 diabetes, multiple admissions for DKA/HHS and leaving AMA from the hospital being admitted to the hospital with HHS.  HHS-with significant hyperglycemia, no acidosis.  Likely due to noncompliance with  insulin. -Inpatient admission to stepdown -IV insulin drip per HHS protocol -Follow BMP every 4 hours  Pseudohyponatremia-due to severe hyperglycemia, on IV fluids per protocol, and will monitor sodium levels expected to improve as his hyperglycemia improves  Hyperkalemia-without acute EKG changes, expect this to improve dramatically with IV fluids and correction of his hyperglycemia.  Was given IV calcium gluconate in the emergency department. -Continue to monitor on telemetry -Follow electrolytes closely, recheck potassium level now since he has received nearly 2 L of normal saline  Acute kidney injury-likely due to severe hyperglycemia and related intravascular depletion. -Avoid nephrotoxins, hold his home lisinopril -Hydrate aggressively as above -Follow renal function with every 4 hours BMP  DVT prophylaxis: Lovenox  Code Status: Full Code  Counseled patient on the importance of compliance with insulin regimen, as well as staying in the hospital long enough for him to treat his HHS.  Explained to him that he is at high risk for developing acidosis which can lead to serious debility and even death. I considered him high risk for leaving AMA.  Consults called: None  Admission status: The appropriate patient status for this patient is INPATIENT. Inpatient status is judged to be reasonable and necessary in order to provide the required intensity of service to ensure the patient's safety. The patient's presenting symptoms, physical exam findings, and initial radiographic and laboratory data in the context of their chronic comorbidities is felt to place them at high risk for further clinical deterioration. Furthermore, it is not anticipated that the patient will be medically stable for discharge from the hospital within 2 midnights of admission.    I certify that at the point of admission it is my clinical judgment that the patient will require inpatient hospital care spanning beyond 2  midnights from the point of admission due to high intensity of service, high risk for further deterioration and high frequency of surveillance required  Time spent: 59 minutes  Kyel Purk Sharlette Dense MD [facility] Pager [phone]  If 7PM-7AM, please contact night-coverage [email or web address] Password [facility]  07/03/2023, 10:46 AM

## 2023-07-03 NOTE — ED Notes (Signed)
CRITICAL VALUE STICKER  CRITICAL VALUE: Potassium >7.5 and Glucose 1126  RECEIVER (on-site recipient of call): L. Pete Glatter, RN  DATE & TIME NOTIFIED: 12.5.24 1000  MESSENGER (representative from lab): Tammy   MD NOTIFIED: Adela Lank  TIME OF NOTIFICATION: 1003   RESPONSE:  Orders received.

## 2023-07-04 DIAGNOSIS — E875 Hyperkalemia: Secondary | ICD-10-CM | POA: Insufficient documentation

## 2023-07-04 DIAGNOSIS — N179 Acute kidney failure, unspecified: Secondary | ICD-10-CM

## 2023-07-04 LAB — GLUCOSE, CAPILLARY
Glucose-Capillary: 232 mg/dL — ABNORMAL HIGH (ref 70–99)
Glucose-Capillary: 190 mg/dL — ABNORMAL HIGH (ref 70–99)
Glucose-Capillary: 228 mg/dL — ABNORMAL HIGH (ref 70–99)
Glucose-Capillary: 392 mg/dL — ABNORMAL HIGH (ref 70–99)
Glucose-Capillary: 239 mg/dL — ABNORMAL HIGH (ref 70–99)

## 2023-07-04 LAB — CBC
HCT: 43 % (ref 39.0–52.0)
Hemoglobin: 14.4 g/dL (ref 13.0–17.0)
MCH: 27.8 pg (ref 26.0–34.0)
MCHC: 33.5 g/dL (ref 30.0–36.0)
MCV: 83 fL (ref 80.0–100.0)
Platelets: 213 10*3/uL (ref 150–400)
RBC: 5.18 MIL/uL (ref 4.22–5.81)
RDW: 12.5 % (ref 11.5–15.5)
WBC: 4.8 10*3/uL (ref 4.0–10.5)
nRBC: 0 % (ref 0.0–0.2)

## 2023-07-04 LAB — BASIC METABOLIC PANEL
Anion gap: 9 (ref 5–15)
BUN: 19 mg/dL (ref 6–20)
CO2: 23 mmol/L (ref 22–32)
Calcium: 9 mg/dL (ref 8.9–10.3)
Chloride: 101 mmol/L (ref 98–111)
Creatinine, Ser: 0.99 mg/dL (ref 0.61–1.24)
GFR, Estimated: >60 mL/min (ref >60)
Glucose, Bld: 194 mg/dL — ABNORMAL HIGH (ref 70–99)
Potassium: 3.8 mmol/L (ref 3.5–5.1)
Sodium: 133 mmol/L — ABNORMAL LOW (ref 135–145)

## 2023-07-04 MED ORDER — INSULIN ASPART 100 UNIT/ML IJ SOLN
4.0000 [IU] | Freq: Three times a day (TID) | INTRAMUSCULAR | Status: DC
  Administered 2023-07-04 – 2023-07-05 (×5): 4 [IU] via SUBCUTANEOUS

## 2023-07-04 MED ORDER — INSULIN ASPART 100 UNIT/ML IJ SOLN
0.0000 [IU] | Freq: Three times a day (TID) | INTRAMUSCULAR | Status: DC
  Administered 2023-07-04: 3 [IU] via SUBCUTANEOUS
  Administered 2023-07-04: 9 [IU] via SUBCUTANEOUS
  Administered 2023-07-04: 2 [IU] via SUBCUTANEOUS
  Administered 2023-07-05: 7 [IU] via SUBCUTANEOUS
  Administered 2023-07-05: 3 [IU] via SUBCUTANEOUS

## 2023-07-04 MED ORDER — INSULIN GLARGINE-YFGN 100 UNIT/ML ~~LOC~~ SOLN
20.0000 [IU] | Freq: Every day | SUBCUTANEOUS | Status: DC
  Administered 2023-07-04 (×2): 20 [IU] via SUBCUTANEOUS
  Filled 2023-07-04 (×4): qty 0.2

## 2023-07-04 MED ORDER — INSULIN ASPART 100 UNIT/ML IJ SOLN
0.0000 [IU] | Freq: Every day | INTRAMUSCULAR | Status: DC
  Administered 2023-07-04 (×2): 2 [IU] via SUBCUTANEOUS

## 2023-07-04 NOTE — Plan of Care (Signed)
  Problem: Education: Goal: Knowledge of General Education information will improve Description: Including pain rating scale, medication(s)/side effects and non-pharmacologic comfort measures Outcome: Progressing   Problem: Clinical Measurements: Goal: Ability to maintain clinical measurements within normal limits will improve Outcome: Progressing   Problem: Activity: Goal: Risk for activity intolerance will decrease Outcome: Progressing   Problem: Nutrition: Goal: Adequate nutrition will be maintained Outcome: Progressing   Problem: Coping: Goal: Level of anxiety will decrease Outcome: Progressing   Problem: Elimination: Goal: Will not experience complications related to bowel motility Outcome: Progressing   Problem: Pain Management: Goal: General experience of comfort will improve Outcome: Progressing   Problem: Safety: Goal: Ability to remain free from injury will improve Outcome: Progressing   Problem: Skin Integrity: Goal: Risk for impaired skin integrity will decrease Outcome: Progressing

## 2023-07-04 NOTE — Plan of Care (Signed)

## 2023-07-04 NOTE — TOC Initial Note (Signed)
Transition of Care [facility]) - Initial/Assessment Note    Patient Details  Name: [name] [name] MRN: [id number] Date of Birth: February 01, 1986  Transition of [facility] CM/SW Contact:    Lanier Clam, RN Phone Number: 07/04/2023, 12:45 PM  Clinical Narrative: Spoke to patient about d/c plans-states he will f/u with [facility] [name] on Monday-when they are open for his medical care-Mary Jacqulyn Bath familiar w/patient. He is working on [employer]. Has #3 co pay for insulin-will need [employer]/med asst/MATCH program @ d/c. Will need bus pass.Provided w/shelter resources/DSS resource/he states he can manage his substance use on his own-decline resources.                 Expected Discharge Plan: Homeless Shelter Barriers to Discharge: Continued Medical Work up   Patient Goals and CMS Choice Patient states their goals for this hospitalization and ongoing recovery are:: Homeless CMS Medicare.gov Compare Post Acute Care list provided to:: Patient Choice offered to / list presented to : Patient [place] ownership interest in [facility].provided to:: Patient    Expected Discharge Plan and Services   Discharge Planning Services: CM Consult Post Acute Care Choice: Resumption of Svcs/PTA Provider Living arrangements for the past 2 months: Homeless Shelter                                      Prior Living Arrangements/Services Living arrangements for the past 2 months: Homeless Shelter                     Activities of Daily Living   ADL Screening (condition at time of admission) Independently performs ADLs?: Yes (appropriate for developmental age) Is the patient deaf or have difficulty hearing?: No Does the patient have difficulty seeing, even when wearing glasses/contacts?: No Does the patient have difficulty concentrating, remembering, or making decisions?: No  Permission Sought/Granted                  Emotional Assessment              Admission  diagnosis:  Hyperosmolar hyperglycemic state (HHS) (HCC) [E11.00] Patient Active Problem List   Diagnosis Date Noted   Hyperkalemia 07/04/2023   Hypoglycemia associated with type 2 diabetes mellitus (HCC) 05/07/2023   Hyperglycemia 04/20/2023   Acute metabolic encephalopathy 04/20/2023   Abdominal pain 01/01/2023   Tobacco abuse 01/01/2023   MSSA bacteremia 05/19/2022   Hyperosmolar hyperglycemic state (HHS) (HCC) 05/16/2022   HTN (hypertension) 05/08/2022   AKI (acute kidney injury) (HCC) 05/08/2022   Pseudohyponatremia 05/08/2022   DM2 (diabetes mellitus, type 2) (HCC) 05/08/2022   Substance induced mood disorder (HCC) 04/06/2022   PCP:  [name], NP Pharmacy:   [facility] [address] [place] [address] Phone: [phone] Fax: [phone]  [id number] - [place], [place] - [address] AT [facility] [address] [place] [address] Phone: [phone] Fax: [phone]     Social Determinants of Health (SDOH) Social History: SDOH Screenings   Food Insecurity: Food Insecurity Present (07/03/2023)  Housing: Patient Declined (07/03/2023)  Recent Concern: Housing - High Risk (05/14/2023)  Transportation Needs: Unmet Transportation Needs (07/03/2023)  Utilities: At Risk (07/03/2023)  Tobacco Use: Low Risk  (07/03/2023)   SDOH Interventions:     Readmission Risk Interventions    06/28/2023    6:34  PM  Readmission Risk Prevention Plan  Transportation Screening Complete  Medication Review [facility]) Referral to Pharmacy  PCP or Specialist appointment within 3-5 days of discharge Complete  HRI or Home Care Consult Not Complete  HRI or Home Care Consult Pt Refusal Comments Not eligible for [facility]  SW Recovery Care/Counseling Consult Complete  Palliative Care Screening Not Applicable  Skilled Nursing Facility Not Applicable

## 2023-07-04 NOTE — Hospital Course (Addendum)
37 year old man PMH poorly controlled insulin-dependent type 2 diabetes, multiple admissions for DKA/HHS, leaving AMA, polysubstance abuse, recently hospitalized within the last week and left AMA, who presented with blurred vision and polyuria.  Found to have a blood sugar over 1000 and admitted for hyperglycemia.  Treat with standard insulin infusion and transition to subcutaneous therapy.  He reports he has no insulin and cannot afford it.   Consultants None  Procedures None

## 2023-07-04 NOTE — Progress Notes (Signed)
  Progress Note   Patient: [name] [name]:[id number] DOB: Mar 14, 1986 DOA: 07/03/2023     1 DOS: the patient was seen and examined on 07/04/2023   Brief hospital course: 37 year old man PMH poorly controlled insulin-dependent type 2 diabetes, multiple admissions for DKA/HHS, leaving AMA, polysubstance abuse, recently hospitalized within the last week and left AMA, who presented with blurred vision and polyuria.  Found to have a blood sugar over 1000 and admitted for hyperglycemia.  Treat with standard insulin infusion and transition to subcutaneous therapy.  He reports he has no insulin and cannot afford it.   Consultants None  Procedures None  Assessment and Plan: HHS--resolved Admission blood sugar 1126 with normal anion gap. Responded well to insulin infusion and successfully transitioned to subcutaneous insulin. Asymptomatic and tolerating diet.  Downgrade to medical bed and transfer.  Diabetes mellitus type 2 insulin-dependent, uncontrolled with hyperglycemia Hemoglobin A1c 12.4 in September Secondary to noncompliance, reports being unable to afford insulin Diabetic RN coordinator consultation appreciated.  TOC consultation for assistance with medications. Continue subcutaneous insulin and transfer to medical floor.  Home when medication supply arranged.  Hopefully tomorrow.   Hyperkalemia--resolved Secondary to AKI, severe hyperglycemia.  Without acute EKG changes. Resolved with standard treatment.   Acute kidney injury--resolved Complicated by lisinopril.  Secondary to severe hyperglycemia with urinary losses.      Subjective:  Feels better Tolerated breakfast Does not have insulin, not able to afford it  Physical Exam: Vitals:   07/04/23 0730 07/04/23 0800 07/04/23 0824 07/04/23 1131  BP:  (!) 154/83  (!) 140/84  Pulse: 87 82  89  Resp: 12 15    Temp:   97.9 F (36.6 C)   TempSrc:   Oral   SpO2: 97% 97%  100%  Weight:      Height:       Physical  Exam Vitals reviewed.  Constitutional:      General: He is not in acute distress.    Appearance: He is not ill-appearing or toxic-appearing.  Cardiovascular:     Rate and Rhythm: Normal rate and regular rhythm.     Heart sounds: No murmur heard. Pulmonary:     Effort: Pulmonary effort is normal. No respiratory distress.     Breath sounds: No wheezing, rhonchi or rales.  Abdominal:     Palpations: Abdomen is soft.  Neurological:     Mental Status: He is alert.  Psychiatric:        Mood and Affect: Mood normal.        Behavior: Behavior normal.     Data Reviewed: CBG stable Initial glucose 1,126, normal AG Creatinine 1.61 >> 0.99 K+ 3.8, AG 9 CBC WNL  Family Communication: none present or requested  Disposition: Status is: Inpatient Remains inpatient appropriate because: hyperglycemia, does not have insulin     Time spent: 35 minutes  Author: Brendia Sacks, MD 07/04/2023 11:56 AM  For on call review [email or web address].

## 2023-07-04 NOTE — Progress Notes (Signed)
Pt will not keep BP cuff on.

## 2023-07-05 ENCOUNTER — Other Ambulatory Visit (HOSPITAL_COMMUNITY): Payer: Self-pay

## 2023-07-05 DIAGNOSIS — Z794 Long term (current) use of insulin: Secondary | ICD-10-CM

## 2023-07-05 DIAGNOSIS — E1165 Type 2 diabetes mellitus with hyperglycemia: Secondary | ICD-10-CM

## 2023-07-05 LAB — GLUCOSE, CAPILLARY
Glucose-Capillary: 323 mg/dL — ABNORMAL HIGH (ref 70–99)
Glucose-Capillary: 246 mg/dL — ABNORMAL HIGH (ref 70–99)

## 2023-07-05 MED ORDER — BLOOD GLUCOSE TEST VI STRP
1.0000 | ORAL_STRIP | Freq: Three times a day (TID) | 0 refills | Status: DC
  Filled 2023-07-05: qty 100, 30d supply, fill #0

## 2023-07-05 MED ORDER — INSULIN ASPART 100 UNIT/ML FLEXPEN
8.0000 [IU] | PEN_INJECTOR | Freq: Three times a day (TID) | SUBCUTANEOUS | 0 refills | Status: DC
  Filled 2023-07-05: qty 6, 25d supply, fill #0

## 2023-07-05 MED ORDER — INSULIN GLARGINE-YFGN 100 UNIT/ML ~~LOC~~ SOPN
25.0000 [IU] | PEN_INJECTOR | Freq: Every day | SUBCUTANEOUS | 0 refills | Status: DC
Start: 2023-07-05 — End: 2023-08-10
  Filled 2023-07-05: qty 6, 24d supply, fill #0

## 2023-07-05 MED ORDER — PEN NEEDLES 31G X 5 MM MISC
1.0000 | Freq: Three times a day (TID) | 2 refills | Status: DC
  Filled 2023-07-05: qty 100, 30d supply, fill #0

## 2023-07-05 MED ORDER — LANCETS MISC
1.0000 | Freq: Three times a day (TID) | 2 refills | Status: DC
  Filled 2023-07-05: qty 100, 30d supply, fill #0

## 2023-07-05 NOTE — TOC Transition Note (Signed)
Transition of Care [facility]) - CM/SW Discharge Note   Patient Details  Name: [name] [name] MRN: [id number] Date of Birth: [birth year]  [facility] CM/SW Contact:  Darleene Cleaver, LCSW Phone Number: 07/05/2023, 2:22 PM   Clinical Narrative:     CSW was informed that patient needs medication assistance from the [facility]  Patient was able to have his meds delivered to his room before he leaves.  Patient stated he will follow up with [facility] on Monday with the nurse that follows him there.  Patient requested bus pass, bus pass provided for patient to discharge to homeless shelter.  TOC signing off, resources added to AVS.  Final next level of care: Homeless Shelter Barriers to Discharge: Barriers Resolved   Patient Goals and CMS Choice CMS Medicare.gov Compare Post Acute Care list provided to:: Patient Choice offered to / list presented to : Patient  Discharge Placement  Homeless shelter.                       Discharge Plan and Services Additional resources added to the After Visit Summary for     Discharge Planning Services: CM Consult Post Acute Care Choice: Resumption of Svcs/PTA Provider                               Social Determinants of Health (SDOH) Interventions SDOH Screenings   Food Insecurity: Food Insecurity Present (07/03/2023)  Housing: Patient Declined (07/03/2023)  Recent Concern: Housing - High Risk (05/14/2023)  Transportation Needs: Unmet Transportation Needs (07/03/2023)  Utilities: At Risk (07/03/2023)  Tobacco Use: Low Risk  (07/03/2023)     Readmission Risk Interventions    07/04/2023   12:49 PM 06/28/2023    6:34 PM  Readmission Risk Prevention Plan  Transportation Screening Complete Complete  Medication Review (RN Care Manager) Complete Referral to Pharmacy  PCP or Specialist appointment within 3-5 days of discharge Complete Complete  HRI or Home Care Consult Complete Not Complete  HRI or Home Care Consult Pt  Refusal Comments  Not eligible for [facility]  SW Recovery Care/Counseling Consult Complete Complete  Palliative Care Screening Not Applicable Not Applicable  Skilled Nursing Facility Not Applicable Not Applicable

## 2023-07-05 NOTE — Discharge Summary (Addendum)
Physician Discharge Summary   Patient: [name] [name] MRN: [id number] DOB: 02-14-1986  Admit date:     07/03/2023  Discharge date: 07/05/23  Discharge Physician: Brendia Sacks   PCP: [name], NP   Recommendations at discharge:   Care of uncontrolled DM  Discharge Diagnoses: Principal Problem:   Hyperosmolar hyperglycemic state (HHS) (HCC) Active Problems:   AKI (acute kidney injury) (HCC)   DM2 (diabetes mellitus, type 2) (HCC)   Hyperkalemia  Resolved Problems:   * No resolved hospital problems. *  Hospital Course: 37 year old man PMH poorly controlled insulin-dependent type 2 diabetes, multiple admissions for DKA/HHS, leaving AMA, polysubstance abuse, recently hospitalized within the last week and left AMA, who presented with blurred vision and polyuria.  Found to have a blood sugar over 1000 and admitted for hyperglycemia.  Treated with standard insulin infusion and transition to subcutaneous therapy.  He reported he has no insulin and cannot afford it.  He was seen by [facility] with plan for match.  Will utilize [employer] meds to bed to ensure patient has needed prescriptions on discharge.   Consultants None  Procedures None  HHS--resolved Admission blood sugar 1126 with normal anion gap. Responded well to insulin infusion and successfully transitioned to subcutaneous insulin. Asymptomatic and tolerating diet.      Diabetes mellitus type 2 insulin-dependent, uncontrolled with hyperglycemia Hemoglobin A1c 12.4 in September Secondary to noncompliance, reports being unable to afford insulin Diabetic RN coordinator consultation appreciated.  TOC consultation for assistance with medications -- MATCH Home when medication supply arranged.   CBG high 190-392. 16 units SSI, 4 units Novolog with meals x3, 20 units Semglee at bedtime.   Hyperkalemia--resolved Secondary to AKI, severe hyperglycemia.  Without acute EKG changes. Resolved with standard treatment.   Acute  kidney injury--resolved Complicated by lisinopril.  Secondary to severe hyperglycemia with urinary losses. Can resume lisinopril   Disposition: Home Diet recommendation:  Discharge Diet Orders (From admission, onward)     Start     Ordered   07/05/23 0000  Diet Carb Modified        07/05/23 1115           Carb modified diet DISCHARGE MEDICATION: Allergies as of 07/05/2023   No Known Allergies      Medication List     TAKE these medications    Accu-Chek Guide w/Device Kit Use to check blood sugar three times daily   BLOOD GLUCOSE TEST STRIPS Strp Use test strip to check blood sugar three times daily   insulin aspart 100 UNIT/ML FlexPen Commonly known as: NOVOLOG Inject 8 Units into the skin 3 (three) times daily with meals. Only take if eating a meal AND Blood Glucose (BG) is 80 or higher.   insulin glargine-yfgn 100 UNIT/ML Pen Commonly known as: SEMGLEE Inject 25 Units into the skin at bedtime.   Lancet Device Misc 1 each by Does not apply route 3 (three) times daily. May dispense any manufacturer covered by patient's insurance.   Lancets Misc Use to check blood sugar three times daily   lisinopril 20 MG tablet Commonly known as: ZESTRIL Take 1 tablet (20 mg total) by mouth daily.   Pen Needles 31G X 5 MM Misc Use  three times daily        Follow-up Information     [employer] Go to.   Why: Monday-Friday when open 8a-3p-medical clinic-medical care/meds/shower/mail. Contact information: [address] [place] [address] [phone]  Feels better, ready to go home  Discharge Exam: Filed Weights   07/03/23 0749 07/03/23 1650  Weight: 93.9 kg 92.3 kg   Physical Exam Vitals reviewed.  Constitutional:      General: He is not in acute distress.    Appearance: He is not ill-appearing or toxic-appearing.  Cardiovascular:     Rate and Rhythm: Normal rate and regular rhythm.     Heart sounds: No murmur  heard. Pulmonary:     Effort: Pulmonary effort is normal. No respiratory distress.     Breath sounds: No wheezing, rhonchi or rales.  Neurological:     Mental Status: He is alert.  Psychiatric:        Mood and Affect: Mood normal.        Behavior: Behavior normal.      Condition at discharge: good  The results of significant diagnostics from this hospitalization (including imaging, microbiology, ancillary and laboratory) are listed below for reference.   Imaging Studies: No results found.  Microbiology: Results for orders placed or performed during the hospital encounter of 07/03/23  MRSA Next Gen by PCR, Nasal     Status: Abnormal   Collection Time: 07/03/23  5:06 PM   Specimen: Nasal Mucosa; Nasal Swab  Result Value Ref Range Status   MRSA by PCR Next Gen DETECTED (A) NOT DETECTED Final    Comment: (NOTE) The GeneXpert MRSA Assay (FDA approved for NASAL specimens only), is one component of a comprehensive MRSA colonization surveillance program. It is not intended to diagnose MRSA infection nor to guide or monitor treatment for MRSA infections. Test performance is not FDA approved in patients less than 84 years old. Performed at [facility], [address] [place], [place] [address]     Labs: CBC: Recent Labs  Lab 07/01/23 2107 07/01/23 2130 07/03/23 0855 07/04/23 0249  WBC 4.7  --  4.5 4.8  NEUTROABS  --   --  2.4  --   HGB 16.1 17.0  17.0 16.2 14.4  HCT 48.6 50.0  50.0 48.5 43.0  MCV 84.4  --  84.8 83.0  PLT 212  --  224 213   Basic Metabolic Panel: Recent Labs  Lab 07/03/23 0855 07/03/23 1046 07/03/23 1705 07/03/23 2136 07/04/23 0249  NA 121* 126* 136 135 133*  K >7.5* 4.6 3.1* 3.4* 3.8  CL 81* 87* 102 101 101  CO2 29 28 23 25 23   GLUCOSE 1,126* 901* 80 128* 194*  BUN 28* 27* 23* 21* 19  CREATININE 1.61* 1.41* 1.01 0.96 0.99  CALCIUM 10.3 9.5 9.8 9.1 9.0   Liver Function Tests: Recent Labs  Lab 07/01/23 2107  07/03/23 0855  AST 21 14*  ALT 18 17  ALKPHOS 67 91  BILITOT 0.9 1.2*  PROT 7.7 8.7*  ALBUMIN 4.1 4.8   CBG: Recent Labs  Lab 07/04/23 0748 07/04/23 1056 07/04/23 1636 07/04/23 2053 07/05/23 0825  GLUCAP 190* 228* 392* 239* 323*    Discharge time spent: more than 30 minutes.  Signed: Brendia Sacks, MD [facility] 07/05/2023

## 2023-07-05 NOTE — Progress Notes (Signed)
Discharge medications from the [facility] delivered to bedside in a secure bag by this RN. Pt also given 2 bus passes from [facility]  by this RN. Pt states his dinner will be here at 1500. Primary RN updated.

## 2023-07-12 ENCOUNTER — Emergency Department (HOSPITAL_COMMUNITY)
Admission: EM | Admit: 2023-07-12 | Discharge: 2023-07-13 | Disposition: A | Discharge status: Home / Self Care | Payer: Self-pay | Attending: Emergency Medicine | Admitting: Emergency Medicine

## 2023-07-12 ENCOUNTER — Other Ambulatory Visit: Payer: Self-pay

## 2023-07-12 ENCOUNTER — Encounter (HOSPITAL_COMMUNITY): Payer: Self-pay

## 2023-07-12 DIAGNOSIS — Z794 Long term (current) use of insulin: Secondary | ICD-10-CM | POA: Insufficient documentation

## 2023-07-12 DIAGNOSIS — Z91199 Patient's noncompliance with other medical treatment and regimen due to unspecified reason: Secondary | ICD-10-CM

## 2023-07-12 DIAGNOSIS — E1165 Type 2 diabetes mellitus with hyperglycemia: Secondary | ICD-10-CM

## 2023-07-12 LAB — I-STAT VENOUS BLOOD GAS, ED
Acid-Base Excess: 4 mmol/L — ABNORMAL HIGH (ref 0.0–2.0)
Bicarbonate: 31.3 mmol/L — ABNORMAL HIGH (ref 20.0–28.0)
Calcium, Ion: 1.25 mmol/L (ref 1.15–1.40)
HCT: 44 % (ref 39.0–52.0)
Hemoglobin: 15 g/dL (ref 13.0–17.0)
O2 Saturation: 18 %
Patient temperature: 37
Potassium: 3.9 mmol/L (ref 3.5–5.1)
Sodium: 131 mmol/L — ABNORMAL LOW (ref 135–145)
TCO2: 33 mmol/L — ABNORMAL HIGH (ref 22–32)
pCO2, Ven: 57.3 mm[Hg] (ref 44–60)
pH, Ven: 7.345 (ref 7.25–7.43)
pO2, Ven: 16 mm[Hg] — CL (ref 32–45)

## 2023-07-12 LAB — RAPID URINE DRUG SCREEN, HOSP PERFORMED
Amphetamines: POSITIVE — AB
Barbiturates: NOT DETECTED
Benzodiazepines: NOT DETECTED
Cocaine: NOT DETECTED
Opiates: NOT DETECTED
Tetrahydrocannabinol: NOT DETECTED

## 2023-07-12 LAB — CBC
HCT: 44.1 % (ref 39.0–52.0)
Hemoglobin: 15.3 g/dL (ref 13.0–17.0)
MCH: 28.4 pg (ref 26.0–34.0)
MCHC: 34.7 g/dL (ref 30.0–36.0)
MCV: 81.8 fL (ref 80.0–100.0)
Platelets: 284 10*3/uL (ref 150–400)
RBC: 5.39 MIL/uL (ref 4.22–5.81)
RDW: 12.4 % (ref 11.5–15.5)
WBC: 5.6 10*3/uL (ref 4.0–10.5)
nRBC: 0 % (ref 0.0–0.2)

## 2023-07-12 LAB — URINALYSIS, ROUTINE W REFLEX MICROSCOPIC
Bacteria, UA: NONE SEEN
Bilirubin Urine: NEGATIVE
Glucose, UA: >=500 mg/dL — AB
Hgb urine dipstick: NEGATIVE
Ketones, ur: NEGATIVE mg/dL
Leukocytes,Ua: NEGATIVE
Nitrite: NEGATIVE
Protein, ur: NEGATIVE mg/dL
Specific Gravity, Urine: 1.031 — ABNORMAL HIGH (ref 1.005–1.030)
pH: 5 (ref 5.0–8.0)

## 2023-07-12 LAB — BASIC METABOLIC PANEL
Anion gap: 10 (ref 5–15)
BUN: 14 mg/dL (ref 6–20)
CO2: 28 mmol/L (ref 22–32)
Calcium: 10.1 mg/dL (ref 8.9–10.3)
Chloride: 94 mmol/L — ABNORMAL LOW (ref 98–111)
Creatinine, Ser: 1.25 mg/dL — ABNORMAL HIGH (ref 0.61–1.24)
GFR, Estimated: >60 mL/min (ref >60)
Glucose, Bld: 499 mg/dL — ABNORMAL HIGH (ref 70–99)
Potassium: 4.1 mmol/L (ref 3.5–5.1)
Sodium: 132 mmol/L — ABNORMAL LOW (ref 135–145)

## 2023-07-12 LAB — CBG MONITORING, ED: Glucose-Capillary: 468 mg/dL — ABNORMAL HIGH (ref 70–99)

## 2023-07-12 LAB — ETHANOL: Alcohol, Ethyl (B): <10 mg/dL (ref <10)

## 2023-07-12 NOTE — ED Provider Triage Note (Signed)
Emergency Medicine Provider Triage Evaluation Note  [name] [name] , [name] 37 y.o. male  was evaluated in triage.  Pt complains of hyperglycemia.  Patient states his blood sugars have been very high" very low however he states he does not know how low "low is."  He states he was at [name] restaurant sitting down when he slumped over.  He thought this was due to low blood sugar.  He is recently mated for DKA.  He denies any EtOH, substance use.  Review of Systems  Positive: Near syncope, elevated CBG Negative:   Physical Exam  BP (!) 144/118 (BP Location: Right Arm)   Pulse 97   Temp 98.2 F (36.8 C)   Resp 18   Ht 5\' 11"  (1.803 m)   Wt 92.1 kg   SpO2 100%   BMI 28.31 kg/m  Gen:   Awake, no distress   Resp:  Normal effort  MSK:   Moves extremities without difficulty  Other:    Medical Decision Making  Medically screening exam initiated at 10:16 PM.  Appropriate orders placed.  [name] [name] was informed that the remainder of the evaluation will be completed by another provider, this initial triage assessment does not replace that evaluation, and the importance of remaining in the ED until their evaluation is complete.  Near syncope   [name] Wanat A, PA-C 07/12/23 2217

## 2023-07-12 NOTE — ED Triage Notes (Addendum)
Pt to ED c/o "feeling like sugar is low" Pt reports dizziness, noted hyperglycemic in triage. Reports compliant with medication regimen. Per records hx DKA.

## 2023-07-13 ENCOUNTER — Encounter (HOSPITAL_COMMUNITY): Payer: Self-pay | Admitting: *Deleted

## 2023-07-13 ENCOUNTER — Emergency Department (HOSPITAL_COMMUNITY)
Admission: EM | Admit: 2023-07-13 | Discharge: 2023-07-14 | Disposition: A | Discharge status: Home / Self Care | Payer: Self-pay

## 2023-07-13 ENCOUNTER — Other Ambulatory Visit: Payer: Self-pay

## 2023-07-13 DIAGNOSIS — R4586 Emotional lability: Secondary | ICD-10-CM

## 2023-07-13 DIAGNOSIS — R45851 Suicidal ideations: Secondary | ICD-10-CM

## 2023-07-13 DIAGNOSIS — F1510 Other stimulant abuse, uncomplicated: Secondary | ICD-10-CM

## 2023-07-13 DIAGNOSIS — Z794 Long term (current) use of insulin: Secondary | ICD-10-CM | POA: Insufficient documentation

## 2023-07-13 DIAGNOSIS — E119 Type 2 diabetes mellitus without complications: Secondary | ICD-10-CM | POA: Insufficient documentation

## 2023-07-13 DIAGNOSIS — Z5900 Homelessness unspecified: Secondary | ICD-10-CM | POA: Insufficient documentation

## 2023-07-13 DIAGNOSIS — F329 Major depressive disorder, single episode, unspecified: Secondary | ICD-10-CM

## 2023-07-13 LAB — CBC
HCT: 43.6 % (ref 39.0–52.0)
Hemoglobin: 14.9 g/dL (ref 13.0–17.0)
MCH: 28.1 pg (ref 26.0–34.0)
MCHC: 34.2 g/dL (ref 30.0–36.0)
MCV: 82.1 fL (ref 80.0–100.0)
Platelets: 307 10*3/uL (ref 150–400)
RBC: 5.31 MIL/uL (ref 4.22–5.81)
RDW: 12.8 % (ref 11.5–15.5)
WBC: 7.1 10*3/uL (ref 4.0–10.5)
nRBC: 0 % (ref 0.0–0.2)

## 2023-07-13 LAB — COMPREHENSIVE METABOLIC PANEL
ALT: 13 U/L (ref 0–44)
AST: 19 U/L (ref 15–41)
Albumin: 3.3 g/dL — ABNORMAL LOW (ref 3.5–5.0)
Alkaline Phosphatase: 72 U/L (ref 38–126)
Anion gap: 11 (ref 5–15)
BUN: 22 mg/dL — ABNORMAL HIGH (ref 6–20)
CO2: 24 mmol/L (ref 22–32)
Calcium: 9 mg/dL (ref 8.9–10.3)
Chloride: 104 mmol/L (ref 98–111)
Creatinine, Ser: 1.23 mg/dL (ref 0.61–1.24)
GFR, Estimated: >60 mL/min (ref >60)
Glucose, Bld: 88 mg/dL (ref 70–99)
Potassium: 4.4 mmol/L (ref 3.5–5.1)
Sodium: 139 mmol/L (ref 135–145)
Total Bilirubin: 0.5 mg/dL (ref <1.2)
Total Protein: 6.6 g/dL (ref 6.5–8.1)

## 2023-07-13 LAB — CBG MONITORING, ED
Glucose-Capillary: 338 mg/dL — ABNORMAL HIGH (ref 70–99)
Glucose-Capillary: 429 mg/dL — ABNORMAL HIGH (ref 70–99)

## 2023-07-13 LAB — RAPID URINE DRUG SCREEN, HOSP PERFORMED
Amphetamines: POSITIVE — AB
Barbiturates: NOT DETECTED
Benzodiazepines: NOT DETECTED
Cocaine: NOT DETECTED
Opiates: NOT DETECTED
Tetrahydrocannabinol: NOT DETECTED

## 2023-07-13 LAB — ETHANOL: Alcohol, Ethyl (B): <10 mg/dL (ref <10)

## 2023-07-13 LAB — ACETAMINOPHEN LEVEL: Acetaminophen (Tylenol), Serum: <10 ug/mL — ABNORMAL LOW (ref 10–30)

## 2023-07-13 LAB — SALICYLATE LEVEL: Salicylate Lvl: <7 mg/dL — ABNORMAL LOW (ref 7.0–30.0)

## 2023-07-13 MED ORDER — INSULIN ASPART 100 UNIT/ML IJ SOLN
8.0000 [IU] | Freq: Once | INTRAMUSCULAR | Status: AC
  Administered 2023-07-13: 8 [IU] via SUBCUTANEOUS

## 2023-07-13 MED ORDER — INSULIN ASPART 100 UNIT/ML IJ SOLN
3.0000 [IU] | Freq: Once | INTRAMUSCULAR | Status: AC
  Administered 2023-07-13: 3 [IU] via SUBCUTANEOUS

## 2023-07-13 MED ORDER — INSULIN ASPART 100 UNIT/ML IJ SOLN
0.0000 [IU] | INTRAMUSCULAR | Status: DC
  Administered 2023-07-14: 3 [IU] via SUBCUTANEOUS

## 2023-07-13 NOTE — ED Notes (Signed)
Pt dressed in scrubs personal belongings  placed in a  bag and taken to locker number 1  and others to security

## 2023-07-13 NOTE — ED Provider Notes (Signed)
[facility] EMERGENCY DEPARTMENT AT [facility] Provider Note   CSN: [id number] Arrival date & time: 07/12/23  2150     History  Chief Complaint  Patient presents with   Dizziness   Hyperglycemia    [name] [name] is a 37 y.o. male.  The history is provided by the patient.  Dizziness Quality:  Lightheadedness Severity:  Moderate Onset quality:  Gradual Timing:  Constant Progression:  Unchanged Chronicity:  Recurrent Context: not when urinating   Relieved by:  Nothing Worsened by:  Nothing Ineffective treatments:  None tried Associated symptoms: no blood in stool   Risk factors: no anemia   Hyperglycemia Associated symptoms: dizziness   Associated symptoms: no fever and no polyuria   Patient is not taking home insulin.       Home Medications Prior to Admission medications   Medication Sig Start Date End Date Taking? Authorizing Provider  blood glucose meter kit and supplies KIT Use to check blood sugar three times daily 05/14/23   [name], Deno Lunger, MD  Glucose Blood (BLOOD GLUCOSE TEST STRIPS) STRP Use test strip to check blood sugar three times daily 07/05/23   Standley Brooking, MD  insulin aspart (NOVOLOG) 100 UNIT/ML FlexPen Inject 8 Units into the skin 3 (three) times daily with meals. Only take if eating a meal AND Blood Glucose (BG) is 80 or higher. 07/05/23 09/03/23  Standley Brooking, MD  insulin glargine-yfgn (SEMGLEE) 100 UNIT/ML Pen Inject 25 Units into the skin at bedtime. 07/05/23 09/03/23  Standley Brooking, MD  Insulin Pen Needle (PEN NEEDLES) 31G X 5 MM MISC Use  three times daily 07/05/23   Standley Brooking, MD  Lancet Device MISC 1 each by Does not apply route 3 (three) times daily. May dispense any manufacturer covered by patient's insurance. 05/14/23   [name], Deno Lunger, MD  Lancets MISC Use to check blood sugar three times daily 07/05/23   Standley Brooking, MD  lisinopril (ZESTRIL) 20 MG tablet Take 1 tablet (20 mg total) by mouth  daily. 05/14/23 07/03/23  [name], Deno Lunger, MD      Allergies    Patient has no known allergies.    Review of Systems   Review of Systems  Constitutional:  Negative for fever.  HENT:  Negative for facial swelling.   Eyes:  Negative for photophobia.  Respiratory:  Negative for wheezing and stridor.   Gastrointestinal:  Negative for blood in stool.  Endocrine: Negative for polyphagia and polyuria.  Neurological:  Positive for light-headedness.  All other systems reviewed and are negative.   Physical Exam Updated Vital Signs BP (!) 144/118 (BP Location: Right Arm)   Pulse 97   Temp 98.2 F (36.8 C)   Resp 18   Ht 5\' 11"  (1.803 m)   Wt 92.1 kg   SpO2 100%   BMI 28.31 kg/m  Physical Exam Vitals and nursing note reviewed. Exam conducted with a chaperone present.  Constitutional:      General: He is not in acute distress.    Appearance: Normal appearance. He is well-developed. He is not diaphoretic.     Comments: Sleeping soundly   HENT:     Head: Normocephalic and atraumatic.     Nose: Nose normal.  Eyes:     Conjunctiva/sclera: Conjunctivae normal.     Pupils: Pupils are equal, round, and reactive to light.  Cardiovascular:     Rate and Rhythm: Normal rate and regular rhythm.     Pulses:  Normal pulses.     Heart sounds: Normal heart sounds.  Pulmonary:     Effort: Pulmonary effort is normal.     Breath sounds: Normal breath sounds. No wheezing or rales.  Abdominal:     General: Bowel sounds are normal.     Palpations: Abdomen is soft.     Tenderness: There is no abdominal tenderness. There is no guarding or rebound.  Musculoskeletal:        General: Normal range of motion.     Cervical back: Normal range of motion and neck supple.  Skin:    General: Skin is warm and dry.     Capillary Refill: Capillary refill takes less than 2 seconds.  Neurological:     General: No focal deficit present.     Mental Status: He is alert and oriented to person, place, and time.      Deep Tendon Reflexes: Reflexes abnormal.     ED Results / Procedures / Treatments   Labs (all labs ordered are listed, but only abnormal results are displayed) Results for orders placed or performed during the hospital encounter of 07/12/23  CBG monitoring, ED   Collection Time: 07/12/23  9:57 PM  Result Value Ref Range   Glucose-Capillary 468 (H) 70 - 99 mg/dL  Urinalysis, Routine w reflex microscopic -Urine, Clean Catch   Collection Time: 07/12/23 10:00 PM  Result Value Ref Range   Color, Urine STRAW (A) YELLOW   APPearance CLEAR CLEAR   Specific Gravity, Urine 1.031 (H) 1.005 - 1.030   pH 5.0 5.0 - 8.0   Glucose, UA >=500 (A) NEGATIVE mg/dL   Hgb urine dipstick NEGATIVE NEGATIVE   Bilirubin Urine NEGATIVE NEGATIVE   Ketones, ur NEGATIVE NEGATIVE mg/dL   Protein, ur NEGATIVE NEGATIVE mg/dL   Nitrite NEGATIVE NEGATIVE   Leukocytes,Ua NEGATIVE NEGATIVE   RBC / HPF 0-5 0 - 5 RBC/hpf   WBC, UA 0-5 0 - 5 WBC/hpf   Bacteria, UA NONE SEEN NONE SEEN   Squamous Epithelial / HPF 0-5 0 - 5 /HPF  Rapid urine drug screen (hospital performed)   Collection Time: 07/12/23 10:00 PM  Result Value Ref Range   Opiates NONE DETECTED NONE DETECTED   Cocaine NONE DETECTED NONE DETECTED   Benzodiazepines NONE DETECTED NONE DETECTED   Amphetamines POSITIVE (A) NONE DETECTED   Tetrahydrocannabinol NONE DETECTED NONE DETECTED   Barbiturates NONE DETECTED NONE DETECTED  CBC   Collection Time: 07/12/23 10:11 PM  Result Value Ref Range   WBC 5.6 4.0 - 10.5 K/uL   RBC 5.39 4.22 - 5.81 MIL/uL   Hemoglobin 15.3 13.0 - 17.0 g/dL   HCT 62.1 30.8 - 65.7 %   MCV 81.8 80.0 - 100.0 fL   MCH 28.4 26.0 - 34.0 pg   MCHC 34.7 30.0 - 36.0 g/dL   RDW 84.6 96.2 - 95.2 %   Platelets 284 150 - 400 K/uL   nRBC 0.0 0.0 - 0.2 %  Basic metabolic panel   Collection Time: 07/12/23 10:11 PM  Result Value Ref Range   Sodium 132 (L) 135 - 145 mmol/L   Potassium 4.1 3.5 - 5.1 mmol/L   Chloride 94 (L) 98 - 111  mmol/L   CO2 28 22 - 32 mmol/L   Glucose, Bld 499 (H) 70 - 99 mg/dL   BUN 14 6 - 20 mg/dL   Creatinine, Ser 8.41 (H) 0.61 - 1.24 mg/dL   Calcium 32.4 8.9 - 40.1 mg/dL   GFR, Estimated >02 >  60 mL/min   Anion gap 10 5 - 15  Ethanol   Collection Time: 07/12/23 10:28 PM  Result Value Ref Range   Alcohol, Ethyl (B) <10 <10 mg/dL  I-Stat venous blood gas, ([facility] ED, [facility], [facility])   Collection Time: 07/12/23 10:32 PM  Result Value Ref Range   pH, Ven 7.345 7.25 - 7.43   pCO2, Ven 57.3 44 - 60 mmHg   pO2, Ven 16 (LL) 32 - 45 mmHg   Bicarbonate 31.3 (H) 20.0 - 28.0 mmol/L   TCO2 33 (H) 22 - 32 mmol/L   O2 Saturation 18 %   Acid-Base Excess 4.0 (H) 0.0 - 2.0 mmol/L   Sodium 131 (L) 135 - 145 mmol/L   Potassium 3.9 3.5 - 5.1 mmol/L   Calcium, Ion 1.25 1.15 - 1.40 mmol/L   HCT 44.0 39.0 - 52.0 %   Hemoglobin 15.0 13.0 - 17.0 g/dL   Patient temperature 19.1 C    Sample type VENOUS    Comment NOTIFIED PHYSICIAN   CBG monitoring, ED   Collection Time: 07/13/23 12:05 AM  Result Value Ref Range   Glucose-Capillary 429 (H) 70 - 99 mg/dL  CBG monitoring, ED   Collection Time: 07/13/23  1:16 AM  Result Value Ref Range   Glucose-Capillary 338 (H) 70 - 99 mg/dL   No results found.   EKG None  Radiology No results found.  Procedures Procedures    Medications Ordered in ED Medications  insulin aspart (novoLOG) injection 8 Units (has no administration in time range)    ED Course/ Medical Decision Making/ A&P                                 Medical Decision Making Patient with high sugar not taking meds   Amount and/or Complexity of Data Reviewed External Data Reviewed: notes.    Details: Previous notes reviewed  Labs: ordered.    Details: Urine negative for UTI.  CBG elevated 468.  UDS positive for amphetamine. Normal white count 5.9, normal hemoglobin 15.3, normal platelets, negative ETOH normal anion gap    Risk Prescription drug management. Risk Details: Well appearing,  stable for discharge.  Start taking your insulin.      Final Clinical Impression(s) / ED Diagnoses Final diagnoses:  None   Return for intractable cough, coughing up blood, fevers > 100.4 unrelieved by medication, shortness of breath, intractable vomiting, chest pain, shortness of breath, weakness, numbness, changes in speech, facial asymmetry, abdominal pain, passing out, Inability to tolerate liquids or food, cough, altered mental status or any concerns. No signs of systemic illness or infection. The patient is nontoxic-appearing on exam and vital signs are within normal limits.  I have reviewed the triage vital signs and the nursing notes. Pertinent labs & imaging results that were available during my care of the patient were reviewed by me and considered in my medical decision making (see chart for details). After history, exam, and medical workup I feel the patient has been appropriately medically screened and is safe for discharge home. Pertinent diagnoses were discussed with the patient. Patient was given return precautions.    Rx / DC Orders ED Discharge Orders     None         Osa Campoli, MD 07/13/23 [phone]

## 2023-07-13 NOTE — ED Provider Notes (Signed)
[place] EMERGENCY DEPARTMENT AT [facility] Provider Note   CSN: [id number] Arrival date & time: 07/13/23  1928     History  Chief Complaint  Patient presents with   Psychiatric Evaluation    [name] [name] is a 37 y.o. male.  Presenting the emergency department today for suicidal ideation.  Reports feeling depressed having thoughts of wanting hurt himself.  Reports he would overdose on pain medications.  No prior self-harm attempt.  Denies prior mental health history.  Denies toxic ingestion prior to arrival.  Denies hallucinations or homicidal ideations.  He has no physical complaints currently.        Home Medications Prior to Admission medications   Medication Sig Start Date End Date Taking? Authorizing Provider  blood glucose meter kit and supplies KIT Use to check blood sugar three times daily 05/14/23   [name], Deno Lunger, MD  Glucose Blood (BLOOD GLUCOSE TEST STRIPS) STRP Use test strip to check blood sugar three times daily 07/05/23   Standley Brooking, MD  insulin aspart (NOVOLOG) 100 UNIT/ML FlexPen Inject 8 Units into the skin 3 (three) times daily with meals. Only take if eating a meal AND Blood Glucose (BG) is 80 or higher. 07/05/23 09/03/23  Standley Brooking, MD  insulin glargine-yfgn (SEMGLEE) 100 UNIT/ML Pen Inject 25 Units into the skin at bedtime. 07/05/23 09/03/23  Standley Brooking, MD  Insulin Pen Needle (PEN NEEDLES) 31G X 5 MM MISC Use  three times daily 07/05/23   Standley Brooking, MD  Lancet Device MISC 1 each by Does not apply route 3 (three) times daily. May dispense any manufacturer covered by patient's insurance. 05/14/23   [name], Deno Lunger, MD  Lancets MISC Use to check blood sugar three times daily 07/05/23   Standley Brooking, MD  lisinopril (ZESTRIL) 20 MG tablet Take 1 tablet (20 mg total) by mouth daily. 05/14/23 07/03/23  [name], Deno Lunger, MD      Allergies    Patient has no known allergies.    Review of Systems   Review of  Systems  Physical Exam Updated Vital Signs BP (!) 142/86 (BP Location: Right Arm)   Pulse (!) 117   Temp 98.9 F (37.2 C)   Resp 18   Ht 5\' 11"  (1.803 m)   Wt 92.1 kg   SpO2 97%   BMI 28.32 kg/m  Physical Exam Vitals and nursing note reviewed.  Constitutional:      General: He is not in acute distress.    Appearance: He is not toxic-appearing.  HENT:     Head: Normocephalic.     Nose: Nose normal.  Cardiovascular:     Rate and Rhythm: Normal rate.  Pulmonary:     Effort: Pulmonary effort is normal.     Breath sounds: Normal breath sounds.  Skin:    General: Skin is warm.     Capillary Refill: Capillary refill takes less than 2 seconds.  Neurological:     Mental Status: He is alert and oriented to person, place, and time.  Psychiatric:        Mood and Affect: Mood normal.        Behavior: Behavior normal.     ED Results / Procedures / Treatments   Labs (all labs ordered are listed, but only abnormal results are displayed) Labs Reviewed  COMPREHENSIVE METABOLIC PANEL - Abnormal; Notable for the following components:      Result Value   BUN 22 (*)  Albumin 3.3 (*)    All other components within normal limits  SALICYLATE LEVEL - Abnormal; Notable for the following components:   Salicylate Lvl <7.0 (*)    All other components within normal limits  ACETAMINOPHEN LEVEL - Abnormal; Notable for the following components:   Acetaminophen (Tylenol), Serum <10 (*)    All other components within normal limits  RAPID URINE DRUG SCREEN, HOSP PERFORMED - Abnormal; Notable for the following components:   Amphetamines POSITIVE (*)    All other components within normal limits  ETHANOL  CBC    EKG EKG Interpretation Date/Time:  Sunday July 13 2023 20:13:15 EST Ventricular Rate:  117 PR Interval:  120 QRS Duration:  96 QT Interval:  326 QTC Calculation: 454 R Axis:   12  Text Interpretation: Sinus tachycardia Otherwise normal ECG When compared with ECG of  12-Jul-2023 22:05, PREVIOUS ECG IS PRESENT Confirmed by Estanislado Pandy [phone]) on 07/13/2023 9:21:42 PM  Radiology No results found.  Procedures Procedures    Medications Ordered in ED Medications  insulin aspart (novoLOG) injection 0-15 Units (has no administration in time range)    ED Course/ Medical Decision Making/ A&P Clinical Course as of 07/13/23 2344  Sun Jul 13, 2023  2121 Amphetamines(!): POSITIVE [TY]    Clinical Course User Index [TY] Coral Spikes, DO                                 Medical Decision Making Is a 37 year old male present emergency department for suicidal ideation.  Afebrile, mildly elevated heart rate, is positive for amphetamines on his urine drug screen which may be contributory.  Workup otherwise reassuring no fever or leukocytosis to suggest systemic infection.  Alcohol salicylate acetaminophen levels negative.  Comprehensive metabolic panel with no significant metabolic derangements.  Normal kidney function.  Will have TTS see patient. Sliding scale insulin ordered.   Amount and/or Complexity of Data Reviewed External Data Reviewed:     Details: History of diabetes with admissions for DKA. Labs:  Decision-making details documented in ED Course.  Risk Prescription drug management.          Final Clinical Impression(s) / ED Diagnoses Final diagnoses:  None    Rx / DC Orders ED Discharge Orders     None         Coral Spikes, DO 07/13/23 2344

## 2023-07-13 NOTE — ED Notes (Signed)
[id number] after insulin

## 2023-07-13 NOTE — ED Triage Notes (Signed)
The pt rep[orts that he is here because he is suicidal and he has had these  all day.  He denies taking any behavorial medicines

## 2023-07-14 DIAGNOSIS — F1510 Other stimulant abuse, uncomplicated: Secondary | ICD-10-CM

## 2023-07-14 DIAGNOSIS — F329 Major depressive disorder, single episode, unspecified: Secondary | ICD-10-CM

## 2023-07-14 LAB — CBG MONITORING, ED
Glucose-Capillary: 123 mg/dL — ABNORMAL HIGH (ref 70–99)
Glucose-Capillary: 161 mg/dL — ABNORMAL HIGH (ref 70–99)
Glucose-Capillary: 64 mg/dL — ABNORMAL LOW (ref 70–99)
Glucose-Capillary: 84 mg/dL (ref 70–99)

## 2023-07-14 NOTE — Discharge Instructions (Signed)
Please use resource below to find shelter and further outpatient managements of your condition.  Return if you have any concern.

## 2023-07-14 NOTE — Consult Note (Signed)
[facility] Consult Note  Patient Name: [name] [name] MRN: [id number] DOB: 22-Jan-1986 DATE OF Consult: 07/14/2023  PRIMARY PSYCHIATRIC DIAGNOSES  1.  Major depressive disorder 2.  Amphetamine abuse 3.  Homelessness  4. diabetes mellitus   RECOMMENDATIONS  Medication recommendations: none at this time Non-Medication/therapeutic recommendations: Initiate referral to outpatient psychiatric provider for medication management and therapy.  Provide information for local shelters. Thank you for involving [place] in the care of this patient. If you have any additional questions or concerns, please call [phone] and ask for me or the provider on-call.  TELEPSYCHIATRY ATTESTATION & CONSENT  As the provider for this telehealth consult, I attest that I verified the patient's identity using two separate identifiers, introduced myself to the patient, provided my credentials, disclosed my location, and performed this encounter via a HIPAA-compliant, real-time, face-to-face, two-way, interactive audio and video platform and with the full consent and agreement of the patient (or guardian as applicable.)  Patient physical location: [facility]. Telehealth provider physical location: home office in state of [place].  Video start time: 0224 (Central Time) Video end time: 0234 (Central Time)  IDENTIFYING DATA  [name] [name] is a 37 y.o. year-old male for whom a psychiatric consultation has been ordered by the primary provider. The patient was identified using two separate identifiers.  CHIEF COMPLAINT/REASON FOR CONSULT  - Feeling depressed - Haven't seen or talked to mom in a while - Feeling hopeless - Suicidal ideations reported initially  HISTORY OF PRESENT ILLNESS (HPI)  The patient, a 37 year old male, presented with feelings of depression and hopelessness, which he attributed to not having seen or spoken to his mother for some time. He reports that he resides in [place] and is  currently homeless, a situation he described as resulting from poor life decisions, specifically involving drug use. Although he admitted to past methamphetamine use, he claimed to have abstained for several weeks however his UDS is positive for amphetamines but he insisted that he had quit using the substance.  He expressed feelings of hopelessness and mentioned praying for strength, indicating a reliance on religious beliefs for support. Initially upon arrival to the ER, he reported experiencing suicidal ideations, which he described as a first-time occurrence. However, during this evaluation, he stated that he no longer felt suicidal and had not attempted suicide in the past. He denied experiencing auditory or visual hallucinations.  The patient reported smoking at least half a pack of cigarettes daily but denied alcohol consumption. He mentioned being unemployed but reports contacting someone for a job interview.   Upon arrival to the ER , he was administered insulin due to high blood sugar levels, which he acknowledged as a concern. It was emphasized that he needs to follow up with a doctor to manage his blood sugar levels, as both hyperglycemia and hypoglycemia pose significant health risks.   It was discussed that he would be discharged with outpatient resources, including shelter information, to ensure his safety and well-being outside the hospital and patient was agreeable to this.  PAST PSYCHIATRIC HISTORY  - No prior psychiatric hospitalizations - First time experiencing suicidal ideations  PAST MEDICAL HISTORY  Past Medical History:  Diagnosis Date   Diabetes mellitus without complication (HCC)    DKA (diabetic ketoacidosis) (HCC) 05/08/2022   HTN (hypertension) 05/08/2022   Hyperlipidemia    Hyperosmolar hyperglycemic state (HHS) (HCC) 05/08/2022   Hypertension    Hypoglycemia 07/02/2019   Pseudohyponatremia 05/08/2022     HOME MEDICATIONS  Facility Ordered  Medications   Medication   [COMPLETED] insulin aspart (novoLOG) injection 8 Units   [COMPLETED] insulin aspart (novoLOG) injection 3 Units   insulin aspart (novoLOG) injection 0-15 Units   PTA Medications  Medication Sig   blood glucose meter kit and supplies KIT Use to check blood sugar three times daily   Lancet Device MISC 1 each by Does not apply route 3 (three) times daily. May dispense any manufacturer covered by patient's insurance.   insulin aspart (NOVOLOG) 100 UNIT/ML FlexPen Inject 8 Units into the skin 3 (three) times daily with meals. Only take if eating a meal AND Blood Glucose (BG) is 80 or higher.   insulin glargine-yfgn (SEMGLEE) 100 UNIT/ML Pen Inject 25 Units into the skin at bedtime.   Insulin Pen Needle (PEN NEEDLES) 31G X 5 MM MISC Use  three times daily   Lancets MISC Use to check blood sugar three times daily   Glucose Blood (BLOOD GLUCOSE TEST STRIPS) STRP Use test strip to check blood sugar three times daily     ALLERGIES  No Known Allergies  SOCIAL & SUBSTANCE USE HISTORY  Social History   Socioeconomic History   Marital status: Divorced    Spouse name: Not on file   Number of children: Not on file   Years of education: Not on file   Highest education level: Not on file  Occupational History   Not on file  Tobacco Use   Smoking status: Never   Smokeless tobacco: Never  Vaping Use   Vaping status: Unknown  Substance and Sexual Activity   Alcohol use: Never   Drug use: Yes    Types: Amphetamines   Sexual activity: Not on file  Other Topics Concern   Not on file  Social History Narrative   ** Merged History Encounter **       ** Merged History Encounter **       ** Merged History Encounter **       ** Merged History Encounter **       Social Drivers of Corporate investment banker Strain: Not on file  Food Insecurity: Food Insecurity Present (07/03/2023)   Hunger Vital Sign    Worried About Running Out of Food in the Last Year: Often true    Ran Out  of Food in the Last Year: Often true  Transportation Needs: Unmet Transportation Needs (07/03/2023)   PRAPARE - Administrator, Civil Service (Medical): Yes    Lack of Transportation (Non-Medical): Yes  Physical Activity: Not on file  Stress: Not on file  Social Connections: Not on file   Social History   Tobacco Use  Smoking Status Never  Smokeless Tobacco Never   Social History   Substance and Sexual Activity  Alcohol Use Never   Social History   Substance and Sexual Activity  Drug Use Yes   Types: Amphetamines    Additional pertinent information .  FAMILY HISTORY  Family History  Family history unknown: Yes   Family Psychiatric History (if known):  Patient confirmed that there is a history of mental health illness in the family, including suicide attempts. But reports a family history of substance abuse.  MENTAL STATUS EXAM (MSE)  Mental Status Exam: General Appearance: Disheveled  Orientation:  Full (Time, Place, and Person)  Memory:  Immediate;   Good Recent;   Good Remote;   Good  Concentration:  Concentration: Good  Recall:  Good  Attention  Good  Eye Contact:  Fair  Speech:  Normal Rate  Language:  Good  Volume:  Normal  Mood: He described his mood as feeling hopeless and expressed a desire for strength through prayer.  Affect:  Congruent  Thought Process:  Coherent  Thought Content:  Logical  Suicidal Thoughts:  No  Homicidal Thoughts:  No  Judgement:  Fair  Insight:  Fair  Psychomotor Activity:  Normal  Akathisia:  Negative  Fund of Knowledge:  Good    Assets:  Communication Skills Desire for Improvement  Cognition:  WNL  ADL's:  Intact  AIMS (if indicated):       VITALS  Blood pressure (!) 142/86, pulse (!) 117, temperature 98.9 F (37.2 C), resp. rate 18, height 5\' 11"  (1.803 m), weight 92.1 kg, SpO2 97%.  LABS  Admission on 07/13/2023  Component Date Value Ref Range Status   Sodium 07/13/2023 139  135 - 145 mmol/L Final    Potassium 07/13/2023 4.4  3.5 - 5.1 mmol/L Final   Chloride 07/13/2023 104  98 - 111 mmol/L Final   CO2 07/13/2023 24  22 - 32 mmol/L Final   Glucose, Bld 07/13/2023 88  70 - 99 mg/dL Final   Glucose reference range applies only to samples taken after fasting for at least 8 hours.   BUN 07/13/2023 22 (H)  6 - 20 mg/dL Final   Creatinine, Ser 07/13/2023 1.23  0.61 - 1.24 mg/dL Final   Calcium 93/81/8299 9.0  8.9 - 10.3 mg/dL Final   Total Protein 37/16/9678 6.6  6.5 - 8.1 g/dL Final   Albumin 93/81/0175 3.3 (L)  3.5 - 5.0 g/dL Final   AST 05/22/8526 19  15 - 41 U/L Final   ALT 07/13/2023 13  0 - 44 U/L Final   Alkaline Phosphatase 07/13/2023 72  38 - 126 U/L Final   Total Bilirubin 07/13/2023 0.5  <1.2 mg/dL Final   GFR, Estimated 07/13/2023 >60  >60 mL/min Final   Comment: (NOTE) Calculated using the CKD-EPI Creatinine Equation (2021)    Anion gap 07/13/2023 11  5 - 15 Final   Performed at [facility], [address] [place], [place] [address]   Alcohol, Ethyl (B) 07/13/2023 <10  <10 mg/dL Final   Comment: (NOTE) Lowest detectable limit for serum alcohol is 10 mg/dL.  For medical purposes only. Performed at [facility], [address] [place], [place] [address]    Salicylate Lvl 07/13/2023 <7.0 (L)  7.0 - 30.0 mg/dL Final   Performed at [facility], [address] [place], [place] [address]   Acetaminophen (Tylenol), Serum 07/13/2023 <10 (L)  10 - 30 ug/mL Final   Comment: (NOTE) Therapeutic concentrations vary significantly. A range of 10-30 ug/mL  may be an effective concentration for many patients. However, some  are best treated at concentrations outside of this range. Acetaminophen concentrations >150 ug/mL at 4 hours after ingestion  and >50 ug/mL at 12 hours after ingestion are often associated with  toxic reactions.  Performed at [facility], [address], [place], [place] [address]    WBC 07/13/2023 7.1  4.0 - 10.5 K/uL Final   RBC  07/13/2023 5.31  4.22 - 5.81 MIL/uL Final   Hemoglobin 07/13/2023 14.9  13.0 - 17.0 g/dL Final   HCT 76/19/5093 43.6  39.0 - 52.0 % Final   MCV 07/13/2023 82.1  80.0 - 100.0 fL Final   MCH 07/13/2023 28.1  26.0 - 34.0 pg Final   MCHC 07/13/2023 34.2  30.0 - 36.0 g/dL Final   RDW 16/04/9603 12.8  11.5 - 15.5 % Final   Platelets 07/13/2023 307  150 - 400 K/uL Final   nRBC 07/13/2023 0.0  0.0 - 0.2 % Final   Performed at [facility], [address], [place], [place] [address]   Opiates 07/13/2023 NONE DETECTED  NONE DETECTED Final   Cocaine 07/13/2023 NONE DETECTED  NONE DETECTED Final   Benzodiazepines 07/13/2023 NONE DETECTED  NONE DETECTED Final   Amphetamines 07/13/2023 POSITIVE (A)  NONE DETECTED Final   Tetrahydrocannabinol 07/13/2023 NONE DETECTED  NONE DETECTED Final   Barbiturates 07/13/2023 NONE DETECTED  NONE DETECTED Final   Comment: (NOTE) DRUG SCREEN FOR MEDICAL PURPOSES ONLY.  IF CONFIRMATION IS NEEDED FOR ANY PURPOSE, NOTIFY LAB WITHIN 5 DAYS.  LOWEST DETECTABLE LIMITS FOR URINE DRUG SCREEN Drug Class                     Cutoff (ng/mL) Amphetamine and metabolites    1000 Barbiturate and metabolites    200 Benzodiazepine                 200 Opiates and metabolites        300 Cocaine and metabolites        300 THC                            50 Performed at [facility], [address] [place], [place] [address]    Glucose-Capillary 07/14/2023 161 (H)  70 - 99 mg/dL Final   Glucose reference range applies only to samples taken after fasting for at least 8 hours.   Glucose-Capillary 07/14/2023 64 (L)  70 - 99 mg/dL Final   Glucose reference range applies only to samples taken after fasting for at least 8 hours.    PSYCHIATRIC REVIEW OF SYSTEMS (ROS)  ROS: Notable for the following relevant positive findings: ROS  Additional findings:      Musculoskeletal: No abnormal movements observed      Gait & Station: Laying/Sitting      Pain Screening:  Denies      Nutrition & Dental Concerns: none reported  RISK FORMULATION/ASSESSMENT  Is the patient experiencing any suicidal or homicidal ideations: No       Explain if yes:  Protective factors considered for safety management: Protective factors include his religious beliefs, which provide him with a sense of strength, and his desire to reconnect with family. The patient denied having active suicidal thoughts or a plan during the session.  Risk factors/concerns considered for safety management:  Depression Substance abuse/dependence Male gender Unmarried  Is there a safety management plan with the patient and treatment team to minimize risk factors and promote protective factors: Yes           Explain: outpatient psych services Is crisis care placement or psychiatric hospitalization recommended: No     Based on my current evaluation and risk assessment, patient is determined at this time to be at:  Moderate Risk  *RISK ASSESSMENT Risk assessment is a dynamic process; it is possible that this patient's condition, and risk level, may change. This should be re-evaluated and managed over time as appropriate. Please re-consult psychiatric consult services if additional assistance is needed in terms of risk assessment and management. If your team decides to discharge this patient, please advise the patient how to best access emergency psychiatric services, or to call  [phone], if their condition worsens or they feel unsafe in any way.   Norval Morton, NP Telepsychiatry Consult Services

## 2023-07-14 NOTE — BH Assessment (Signed)
TTS Consult has been deferred to [facility]. Coordinator will communicate assessment time in established secure chat. Thanks

## 2023-07-14 NOTE — ED Provider Notes (Signed)
Psychiatric nurse practitioner has seen and evaluated this patient and at this time she felt the patient is psychiatrically cleared.  Patient is no longer suicidal or homicidal and denies any auditory or visual hallucination.  Will discharge patient home with outpatient resources including shelter information   BP (!) 142/86 (BP Location: Right Arm)   Pulse (!) 117   Temp 98.9 F (37.2 C)   Resp 18   Ht 5\' 11"  (1.803 m)   Wt 92.1 kg   SpO2 97%   BMI 28.32 kg/m   Results for orders placed or performed during the hospital encounter of 07/13/23  Comprehensive metabolic panel   Collection Time: 07/13/23  8:30 PM  Result Value Ref Range   Sodium 139 135 - 145 mmol/L   Potassium 4.4 3.5 - 5.1 mmol/L   Chloride 104 98 - 111 mmol/L   CO2 24 22 - 32 mmol/L   Glucose, Bld 88 70 - 99 mg/dL   BUN 22 (H) 6 - 20 mg/dL   Creatinine, Ser 4.09 0.61 - 1.24 mg/dL   Calcium 9.0 8.9 - 81.1 mg/dL   Total Protein 6.6 6.5 - 8.1 g/dL   Albumin 3.3 (L) 3.5 - 5.0 g/dL   AST 19 15 - 41 U/L   ALT 13 0 - 44 U/L   Alkaline Phosphatase 72 38 - 126 U/L   Total Bilirubin 0.5 <1.2 mg/dL   GFR, Estimated >91 >47 mL/min   Anion gap 11 5 - 15  Ethanol   Collection Time: 07/13/23  8:30 PM  Result Value Ref Range   Alcohol, Ethyl (B) <10 <10 mg/dL  Salicylate level   Collection Time: 07/13/23  8:30 PM  Result Value Ref Range   Salicylate Lvl <7.0 (L) 7.0 - 30.0 mg/dL  Acetaminophen level   Collection Time: 07/13/23  8:30 PM  Result Value Ref Range   Acetaminophen (Tylenol), Serum <10 (L) 10 - 30 ug/mL  cbc   Collection Time: 07/13/23  8:30 PM  Result Value Ref Range   WBC 7.1 4.0 - 10.5 K/uL   RBC 5.31 4.22 - 5.81 MIL/uL   Hemoglobin 14.9 13.0 - 17.0 g/dL   HCT 82.9 56.2 - 13.0 %   MCV 82.1 80.0 - 100.0 fL   MCH 28.1 26.0 - 34.0 pg   MCHC 34.2 30.0 - 36.0 g/dL   RDW 86.5 78.4 - 69.6 %   Platelets 307 150 - 400 K/uL   nRBC 0.0 0.0 - 0.2 %  Rapid urine drug screen (hospital performed)   Collection  Time: 07/13/23  8:39 PM  Result Value Ref Range   Opiates NONE DETECTED NONE DETECTED   Cocaine NONE DETECTED NONE DETECTED   Benzodiazepines NONE DETECTED NONE DETECTED   Amphetamines POSITIVE (A) NONE DETECTED   Tetrahydrocannabinol NONE DETECTED NONE DETECTED   Barbiturates NONE DETECTED NONE DETECTED  CBG monitoring, ED   Collection Time: 07/14/23 12:07 AM  Result Value Ref Range   Glucose-Capillary 161 (H) 70 - 99 mg/dL  CBG monitoring, ED   Collection Time: 07/14/23  3:01 AM  Result Value Ref Range   Glucose-Capillary 64 (L) 70 - 99 mg/dL  CBG monitoring, ED   Collection Time: 07/14/23  3:41 AM  Result Value Ref Range   Glucose-Capillary 84 70 - 99 mg/dL   No results found.    Fayrene Helper, PA-C 07/14/23 2952    Tilden Fossa, MD 07/14/23 [phone]

## 2023-07-14 NOTE — ED Notes (Signed)
Belongings from locker returned to pt

## 2023-07-19 ENCOUNTER — Encounter (HOSPITAL_COMMUNITY): Payer: Self-pay | Admitting: Internal Medicine

## 2023-07-19 ENCOUNTER — Other Ambulatory Visit: Payer: Self-pay

## 2023-07-19 ENCOUNTER — Inpatient Hospital Stay (HOSPITAL_COMMUNITY)
Admission: EM | Admit: 2023-07-19 | Discharge: 2023-07-21 | DRG: 872 | Disposition: A | Discharge status: Home / Self Care | Payer: No Typology Code available for payment source | Attending: Internal Medicine | Admitting: Internal Medicine

## 2023-07-19 ENCOUNTER — Emergency Department (HOSPITAL_COMMUNITY): Payer: No Typology Code available for payment source

## 2023-07-19 DIAGNOSIS — R197 Diarrhea, unspecified: Secondary | ICD-10-CM | POA: Diagnosis present

## 2023-07-19 DIAGNOSIS — A4189 Other specified sepsis: Principal | ICD-10-CM | POA: Diagnosis present

## 2023-07-19 DIAGNOSIS — I10 Essential (primary) hypertension: Secondary | ICD-10-CM | POA: Diagnosis present

## 2023-07-19 DIAGNOSIS — E1165 Type 2 diabetes mellitus with hyperglycemia: Secondary | ICD-10-CM | POA: Diagnosis present

## 2023-07-19 DIAGNOSIS — A419 Sepsis, unspecified organism: Secondary | ICD-10-CM | POA: Diagnosis present

## 2023-07-19 DIAGNOSIS — Z91148 Patient's other noncompliance with medication regimen for other reason: Secondary | ICD-10-CM

## 2023-07-19 DIAGNOSIS — M79606 Pain in leg, unspecified: Secondary | ICD-10-CM | POA: Diagnosis not present

## 2023-07-19 DIAGNOSIS — R000 Tachycardia, unspecified: Secondary | ICD-10-CM

## 2023-07-19 DIAGNOSIS — Z794 Long term (current) use of insulin: Principal | ICD-10-CM

## 2023-07-19 DIAGNOSIS — J101 Influenza due to other identified influenza virus with other respiratory manifestations: Principal | ICD-10-CM | POA: Diagnosis present

## 2023-07-19 DIAGNOSIS — Z5900 Homelessness unspecified: Secondary | ICD-10-CM

## 2023-07-19 DIAGNOSIS — E785 Hyperlipidemia, unspecified: Secondary | ICD-10-CM | POA: Diagnosis present

## 2023-07-19 LAB — URINALYSIS, W/ REFLEX TO CULTURE (INFECTION SUSPECTED)
Bacteria, UA: NONE SEEN
Bilirubin Urine: NEGATIVE
Glucose, UA: >=500 mg/dL — AB
Hgb urine dipstick: NEGATIVE
Ketones, ur: 5 mg/dL — AB
Leukocytes,Ua: NEGATIVE
Nitrite: NEGATIVE
Protein, ur: NEGATIVE mg/dL
Specific Gravity, Urine: 1.031 — ABNORMAL HIGH (ref 1.005–1.030)
pH: 5 (ref 5.0–8.0)

## 2023-07-19 LAB — COMPREHENSIVE METABOLIC PANEL
ALT: 12 U/L (ref 0–44)
AST: 18 U/L (ref 15–41)
Albumin: 3.4 g/dL — ABNORMAL LOW (ref 3.5–5.0)
Alkaline Phosphatase: 63 U/L (ref 38–126)
Anion gap: 11 (ref 5–15)
BUN: 12 mg/dL (ref 6–20)
CO2: 25 mmol/L (ref 22–32)
Calcium: 8.6 mg/dL — ABNORMAL LOW (ref 8.9–10.3)
Chloride: 98 mmol/L (ref 98–111)
Creatinine, Ser: 1.16 mg/dL (ref 0.61–1.24)
GFR, Estimated: >60 mL/min (ref >60)
Glucose, Bld: 369 mg/dL — ABNORMAL HIGH (ref 70–99)
Potassium: 4.1 mmol/L (ref 3.5–5.1)
Sodium: 134 mmol/L — ABNORMAL LOW (ref 135–145)
Total Bilirubin: 0.8 mg/dL (ref <1.2)
Total Protein: 6.9 g/dL (ref 6.5–8.1)

## 2023-07-19 LAB — BLOOD GAS, VENOUS
Acid-Base Excess: 3.6 mmol/L — ABNORMAL HIGH (ref 0.0–2.0)
Bicarbonate: 30.1 mmol/L — ABNORMAL HIGH (ref 20.0–28.0)
O2 Saturation: 28.7 %
Patient temperature: 37
pCO2, Ven: 52 mm[Hg] (ref 44–60)
pH, Ven: 7.37 (ref 7.25–7.43)
pO2, Ven: <31 mm[Hg] — CL (ref 32–45)

## 2023-07-19 LAB — CBG MONITORING, ED
Glucose-Capillary: 330 mg/dL — ABNORMAL HIGH (ref 70–99)
Glucose-Capillary: 406 mg/dL — ABNORMAL HIGH (ref 70–99)
Glucose-Capillary: 398 mg/dL — ABNORMAL HIGH (ref 70–99)
Glucose-Capillary: 421 mg/dL — ABNORMAL HIGH (ref 70–99)
Glucose-Capillary: 237 mg/dL — ABNORMAL HIGH (ref 70–99)

## 2023-07-19 LAB — CBC WITH DIFFERENTIAL/PLATELET
Abs Immature Granulocytes: 0.01 10*3/uL (ref 0.00–0.07)
Basophils Absolute: 0 10*3/uL (ref 0.0–0.1)
Basophils Relative: 1 %
Eosinophils Absolute: 0 10*3/uL (ref 0.0–0.5)
Eosinophils Relative: 0 %
HCT: 39 % (ref 39.0–52.0)
Hemoglobin: 13 g/dL (ref 13.0–17.0)
Immature Granulocytes: 0 %
Lymphocytes Relative: 13 %
Lymphs Abs: 0.6 10*3/uL — ABNORMAL LOW (ref 0.7–4.0)
MCH: 28.4 pg (ref 26.0–34.0)
MCHC: 33.3 g/dL (ref 30.0–36.0)
MCV: 85.3 fL (ref 80.0–100.0)
Monocytes Absolute: 0.8 10*3/uL (ref 0.1–1.0)
Monocytes Relative: 17 %
Neutro Abs: 3.1 10*3/uL (ref 1.7–7.7)
Neutrophils Relative %: 69 %
Platelets: 172 10*3/uL (ref 150–400)
RBC: 4.57 MIL/uL (ref 4.22–5.81)
RDW: 12.7 % (ref 11.5–15.5)
WBC: 4.5 10*3/uL (ref 4.0–10.5)
nRBC: 0 % (ref 0.0–0.2)

## 2023-07-19 LAB — RESP PANEL BY RT-PCR (RSV, FLU A&B, COVID)  RVPGX2
Influenza A by PCR: NEGATIVE
Influenza B by PCR: POSITIVE — AB
Resp Syncytial Virus by PCR: NEGATIVE
SARS Coronavirus 2 by RT PCR: NEGATIVE

## 2023-07-19 LAB — RAPID URINE DRUG SCREEN, HOSP PERFORMED
Amphetamines: POSITIVE — AB
Barbiturates: NOT DETECTED
Benzodiazepines: NOT DETECTED
Cocaine: NOT DETECTED
Opiates: NOT DETECTED
Tetrahydrocannabinol: NOT DETECTED

## 2023-07-19 LAB — I-STAT CG4 LACTIC ACID, ED
Lactic Acid, Venous: 2.1 mmol/L (ref 0.5–1.9)
Lactic Acid, Venous: 1.7 mmol/L (ref 0.5–1.9)

## 2023-07-19 LAB — PROTIME-INR
INR: 1 (ref 0.8–1.2)
Prothrombin Time: 13.2 s (ref 11.4–15.2)

## 2023-07-19 LAB — GLUCOSE, CAPILLARY: Glucose-Capillary: 267 mg/dL — ABNORMAL HIGH (ref 70–99)

## 2023-07-19 MED ORDER — ACETAMINOPHEN 325 MG PO TABS
650.0000 mg | ORAL_TABLET | Freq: Once | ORAL | Status: AC
  Administered 2023-07-19: 650 mg via ORAL
  Filled 2023-07-19: qty 2

## 2023-07-19 MED ORDER — ACETAMINOPHEN 325 MG PO TABS
650.0000 mg | ORAL_TABLET | Freq: Four times a day (QID) | ORAL | Status: DC | PRN
  Administered 2023-07-19 – 2023-07-20 (×2): 650 mg via ORAL
  Filled 2023-07-19 (×2): qty 2

## 2023-07-19 MED ORDER — LACTATED RINGERS IV BOLUS (SEPSIS)
1000.0000 mL | Freq: Once | INTRAVENOUS | Status: AC
  Administered 2023-07-19: 1000 mL via INTRAVENOUS

## 2023-07-19 MED ORDER — LISINOPRIL 20 MG PO TABS
20.0000 mg | ORAL_TABLET | Freq: Every day | ORAL | Status: DC
  Administered 2023-07-19 – 2023-07-21 (×3): 20 mg via ORAL
  Filled 2023-07-19: qty 1
  Filled 2023-07-19: qty 2
  Filled 2023-07-19: qty 1

## 2023-07-19 MED ORDER — SODIUM CHLORIDE 0.9 % IV SOLN
500.0000 mg | INTRAVENOUS | Status: DC
  Filled 2023-07-19: qty 5

## 2023-07-19 MED ORDER — TRAZODONE HCL 50 MG PO TABS
50.0000 mg | ORAL_TABLET | Freq: Every evening | ORAL | Status: DC | PRN
Start: 2023-07-19 — End: 2023-07-21
  Filled 2023-07-19: qty 1

## 2023-07-19 MED ORDER — ALBUTEROL SULFATE (2.5 MG/3ML) 0.083% IN NEBU: 2.5000 mg | INHALATION_SOLUTION | RESPIRATORY_TRACT | Status: DC | PRN

## 2023-07-19 MED ORDER — LACTATED RINGERS IV BOLUS (SEPSIS): 1000.0000 mL | Freq: Once | INTRAVENOUS | Status: DC

## 2023-07-19 MED ORDER — INSULIN ASPART 100 UNIT/ML IJ SOLN
8.0000 [IU] | Freq: Once | INTRAMUSCULAR | Status: AC
  Administered 2023-07-19: 8 [IU] via SUBCUTANEOUS
  Filled 2023-07-19: qty 0.08

## 2023-07-19 MED ORDER — NALOXONE HCL 0.4 MG/ML IJ SOLN
0.2000 mg | Freq: Once | INTRAMUSCULAR | Status: AC
  Administered 2023-07-19: 0.2 mg via INTRAVENOUS
  Filled 2023-07-19: qty 1

## 2023-07-19 MED ORDER — INSULIN ASPART 100 UNIT/ML IJ SOLN
0.0000 [IU] | Freq: Every day | INTRAMUSCULAR | Status: DC
  Administered 2023-07-19: 2 [IU] via SUBCUTANEOUS
  Filled 2023-07-19: qty 0.05

## 2023-07-19 MED ORDER — LACTATED RINGERS IV BOLUS
1000.0000 mL | Freq: Once | INTRAVENOUS | Status: AC
  Administered 2023-07-19: 1000 mL via INTRAVENOUS

## 2023-07-19 MED ORDER — SODIUM CHLORIDE 0.9 % IV SOLN
2.0000 g | INTRAVENOUS | Status: DC
  Administered 2023-07-19: 2 g via INTRAVENOUS
  Filled 2023-07-19: qty 20

## 2023-07-19 MED ORDER — INSULIN ASPART 100 UNIT/ML IJ SOLN
0.0000 [IU] | Freq: Three times a day (TID) | INTRAMUSCULAR | Status: DC
  Administered 2023-07-19: 15 [IU] via SUBCUTANEOUS
  Administered 2023-07-19 – 2023-07-20 (×2): 8 [IU] via SUBCUTANEOUS
  Administered 2023-07-20: 11 [IU] via SUBCUTANEOUS
  Administered 2023-07-20: 5 [IU] via SUBCUTANEOUS
  Administered 2023-07-21: 11 [IU] via SUBCUTANEOUS
  Administered 2023-07-21: 8 [IU] via SUBCUTANEOUS
  Filled 2023-07-19: qty 0.15

## 2023-07-19 MED ORDER — INSULIN GLARGINE-YFGN 100 UNIT/ML ~~LOC~~ SOLN
25.0000 [IU] | Freq: Every day | SUBCUTANEOUS | Status: DC
  Administered 2023-07-19: 25 [IU] via SUBCUTANEOUS
  Filled 2023-07-19 (×3): qty 0.25

## 2023-07-19 MED ORDER — IBUPROFEN 200 MG PO TABS
600.0000 mg | ORAL_TABLET | Freq: Four times a day (QID) | ORAL | Status: DC | PRN
  Administered 2023-07-19: 600 mg via ORAL
  Filled 2023-07-19: qty 3

## 2023-07-19 MED ORDER — INSULIN GLARGINE-YFGN 100 UNIT/ML ~~LOC~~ SOPN: 25.0000 [IU] | PEN_INJECTOR | Freq: Every day | SUBCUTANEOUS | Status: DC

## 2023-07-19 MED ORDER — INSULIN ASPART 100 UNIT/ML FLEXPEN: 8.0000 [IU] | PEN_INJECTOR | Freq: Three times a day (TID) | SUBCUTANEOUS | Status: DC

## 2023-07-19 MED ORDER — LACTATED RINGERS IV SOLN: INTRAVENOUS | Status: AC

## 2023-07-19 MED ORDER — OSELTAMIVIR PHOSPHATE 75 MG PO CAPS
75.0000 mg | ORAL_CAPSULE | Freq: Two times a day (BID) | ORAL | Status: DC
  Administered 2023-07-19 – 2023-07-21 (×5): 75 mg via ORAL
  Filled 2023-07-19 (×6): qty 1

## 2023-07-19 MED ORDER — INSULIN ASPART 100 UNIT/ML IJ SOLN
8.0000 [IU] | Freq: Three times a day (TID) | INTRAMUSCULAR | Status: DC
  Administered 2023-07-20 – 2023-07-21 (×5): 8 [IU] via SUBCUTANEOUS
  Filled 2023-07-19: qty 0.08

## 2023-07-19 MED ORDER — ONDANSETRON HCL 4 MG PO TABS: 4.0000 mg | ORAL_TABLET | Freq: Four times a day (QID) | ORAL | Status: DC | PRN

## 2023-07-19 MED ORDER — ONDANSETRON HCL 4 MG/2ML IJ SOLN: 4.0000 mg | Freq: Four times a day (QID) | INTRAMUSCULAR | Status: DC | PRN

## 2023-07-19 MED ORDER — ACETAMINOPHEN 650 MG RE SUPP: 650.0000 mg | Freq: Four times a day (QID) | RECTAL | Status: DC | PRN

## 2023-07-19 MED ORDER — ENOXAPARIN SODIUM 40 MG/0.4ML IJ SOSY
40.0000 mg | PREFILLED_SYRINGE | INTRAMUSCULAR | Status: DC
  Administered 2023-07-20: 40 mg via SUBCUTANEOUS
  Filled 2023-07-19 (×2): qty 0.4

## 2023-07-19 NOTE — ED Notes (Signed)
Pt I stat Lactic Acid given to MD and RN

## 2023-07-19 NOTE — Sepsis Progress Note (Signed)
Elink following code sepsis  Code sepsis DC at [facility]

## 2023-07-19 NOTE — H&P (Signed)
History and Physical  HIMMAT PROPPS UJW:[id number] DOB: 23-Jun-1986 DOA: 07/19/2023  PCP: [name], NP   Chief Complaint: Feeling cold  HPI: [name] [name] is a 37 y.o. male with medical history significant for substance abuse, homelessness, poorly controlled insulin-dependent type 2 diabetes with multiple hospital admissions for DKA/HHS being admitted to the hospital with sepsis in the setting of influenza B.  Patient is very somnolent, was brought here by EMS, reportedly with complaint of leg pain.  However he denies this currently.  He is not really stay awake very much.  Denies any current complaints.  Per my discussion with ER provider and their documentation, the patient states that he has been feeling feverish for the last several hours, has had a cough.  In the emergency department, he is meeting sepsis criteria.  Tested positive for influenza B.  He is also quite hyperglycemic, but no evidence of acidosis.  Initial lactate 2.1, got 3 L of fluid, lactate improved.  Still tachycardic.  UDS positive for amphetamines.  Review of Systems: Please see HPI for pertinent positives and negatives. A complete 10 system review of systems could not be performed due to the patient's lack of cooperation/somnolence.  Past Medical History:  Diagnosis Date   Diabetes mellitus without complication (HCC)    DKA (diabetic ketoacidosis) (HCC) 05/08/2022   HTN (hypertension) 05/08/2022   Hyperlipidemia    Hyperosmolar hyperglycemic state (HHS) (HCC) 05/08/2022   Hypertension    Hypoglycemia 07/02/2019   Pseudohyponatremia 05/08/2022   Past Surgical History:  Procedure Laterality Date   HAND SURGERY     Social History:  reports that he has never smoked. He has never used smokeless tobacco. He reports current drug use. Drug: Amphetamines. He reports that he does not drink alcohol.  No Known Allergies  Family History  Family history unknown: Yes     Prior to Admission medications    Medication Sig Start Date End Date Taking? Authorizing Provider  blood glucose meter kit and supplies KIT Use to check blood sugar three times daily 05/14/23   [name], Deno Lunger, MD  Glucose Blood (BLOOD GLUCOSE TEST STRIPS) STRP Use test strip to check blood sugar three times daily 07/05/23   Standley Brooking, MD  insulin aspart (NOVOLOG) 100 UNIT/ML FlexPen Inject 8 Units into the skin 3 (three) times daily with meals. Only take if eating a meal AND Blood Glucose (BG) is 80 or higher. 07/05/23 09/03/23  Standley Brooking, MD  insulin glargine-yfgn (SEMGLEE) 100 UNIT/ML Pen Inject 25 Units into the skin at bedtime. 07/05/23 09/03/23  Standley Brooking, MD  Insulin Pen Needle (PEN NEEDLES) 31G X 5 MM MISC Use  three times daily 07/05/23   Standley Brooking, MD  Lancet Device MISC 1 each by Does not apply route 3 (three) times daily. May dispense any manufacturer covered by patient's insurance. 05/14/23   [name], Deno Lunger, MD  Lancets MISC Use to check blood sugar three times daily 07/05/23   Standley Brooking, MD  lisinopril (ZESTRIL) 20 MG tablet Take 1 tablet (20 mg total) by mouth daily. 05/14/23 07/03/23  Marinda Elk, MD    Physical Exam: BP (!) 164/112   Pulse (!) 112   Temp 100.2 F (37.9 C) (Axillary)   Resp 19   Ht 5\' 11"  (1.803 m)   Wt 97.5 kg   SpO2 98%   BMI 29.99 kg/m  General: Somnolent, arouses to tactile and verbal stimulus but falls back asleep quickly.  Does not seem to be participating much in interview. Eyes: EOMI, eyes are bloodshot, pupils are pinpoint Cardiovascular: Tachycardic and regular, no murmurs or rubs, no peripheral edema  Respiratory: clear to auscultation bilaterally, no wheezes, no crackles  Abdomen: soft, nontender, nondistended, normal bowel tones heard  Skin: dry, no rashes  Musculoskeletal: no joint effusions, normal range of motion           Labs on Admission:  Basic Metabolic Panel: Recent Labs  Lab 07/12/23 2211 07/12/23 2232  07/13/23 2030 07/19/23 0852  NA 132* 131* 139 134*  K 4.1 3.9 4.4 4.1  CL 94*  --  104 98  CO2 28  --  24 25  GLUCOSE 499*  --  88 369*  BUN 14  --  22* 12  CREATININE 1.25*  --  1.23 1.16  CALCIUM 10.1  --  9.0 8.6*   Liver Function Tests: Recent Labs  Lab 07/13/23 2030 07/19/23 0852  AST 19 18  ALT 13 12  ALKPHOS 72 63  BILITOT 0.5 0.8  PROT 6.6 6.9  ALBUMIN 3.3* 3.4*   No results for input(s): "LIPASE", "AMYLASE" in the last 168 hours. No results for input(s): "AMMONIA" in the last 168 hours. CBC: Recent Labs  Lab 07/12/23 2211 07/12/23 2232 07/13/23 2030 07/19/23 0852  WBC 5.6  --  7.1 4.5  NEUTROABS  --   --   --  3.1  HGB 15.3 15.0 14.9 13.0  HCT 44.1 44.0 43.6 39.0  MCV 81.8  --  82.1 85.3  PLT 284  --  307 172   Cardiac Enzymes: No results for input(s): "CKTOTAL", "CKMB", "CKMBINDEX", "TROPONINI" in the last 168 hours. BNP (last 3 results) No results for input(s): "BNP" in the last 8760 hours.  ProBNP (last 3 results) No results for input(s): "PROBNP" in the last 8760 hours.  CBG: Recent Labs  Lab 07/14/23 0537 07/19/23 0652 07/19/23 0954 07/19/23 1123 07/19/23 1213  GLUCAP 123* 330* 406* 398* 421*    Radiological Exams on Admission: DG Chest Port 1 View Result Date: 07/19/2023 CLINICAL DATA:  Questionable sepsis.  Evaluate for abnormality. EXAM: PORTABLE CHEST 1 VIEW COMPARISON:  05/20/2023 FINDINGS: Heart size and mediastinal contours are unremarkable. No pleural fluid, interstitial edema or airspace disease. The visualized osseous structures appear unremarkable. IMPRESSION: No active disease. Electronically Signed   By: Signa Kell M.D.   On: 07/19/2023 07:53   Assessment/Plan [name] [name] is a 37 y.o. male with medical history significant for homelessness, poorly controlled insulin-dependent type 2 diabetes with multiple hospital admissions for DKA/HHS being admitted to the hospital with sepsis in the setting of influenza B.   Severe  sepsis-meeting criteria at the time of admission with tachycardia, fever, lactate 2.1.  Hemodynamically stable.  Source is likely influenza B. -Observation admission -Follow-up blood cultures -Will hold off on antibiotics for the time being, treat flu as below  Influenza B-Per nasal swab in the ER today.  He is stable on room air. -Tamiflu 75 mg p.o. twice daily for 5 days  Somnolence-unclear to me whether this is behavioral, though he does have pinpoint pupils.  UDS positive for amphetamines, which may explain his persistent tachycardia despite aggressive fluid resuscitation and improved lactate.  Will give 0.2 mg Narcan x 1 now, and assess response.  Poorly controlled insulin-dependent type 2 diabetes-with history of multiple hospital admissions for DKA/HHS most recently just earlier this month.  Note hemoglobin A1c 12.4 on 04/20/2023.  Currently quite hyperglycemic, given 8  units Semglee, and 15 units NovoLog. -Carb controlled diet -Follow-up blood sugar in 1 hour, and redose as appropriate -Will plan to resume his home basal insulin from tonight -Moderate dose sliding scale along with 8 units NovoLog with meals in the meantime -Will monitor closely as he is high risk for requiring IV insulin drip, especially in the setting of acute infection  Hypertension-continue home lisinopril  DVT prophylaxis: Lovenox     Code Status: Full Code  Consults called: None  Admission status: Observation  Time spent: 48 minutes  Yerania Chamorro Sharlette Dense MD [facility] Pager [phone]  If 7PM-7AM, please contact night-coverage [email or web address] Password [facility]  07/19/2023, 12:51 PM

## 2023-07-19 NOTE — ED Notes (Signed)
Provider informed of being unable to obtain 2nd set of BC. IV abx to be administered without 2nd set

## 2023-07-19 NOTE — ED Notes (Signed)
BG remains elevated. Pt did eat flaming hot cheetos and this RN observed empty reese cup wrappers.

## 2023-07-19 NOTE — ED Notes (Signed)
Provider informed that patient is a difficult stick. Huntley Dec, RN to attempt [place] iv

## 2023-07-19 NOTE — ED Notes (Addendum)
Dr. Kirby Crigler notified of pt's blood glucose of 421. Verbal order for 15 units of short acting insulin and to then recheck in 1 hour

## 2023-07-19 NOTE — ED Notes (Signed)
CRITICAL VALUE STICKER  CRITICAL VALUE: pO2 <30  RECEIVER (on-site recipient of call):I. Logan Bores, RN  DATE & TIME NOTIFIED: 07/19/23 [phone]  MESSENGER (representative from lab):Lab  MD NOTIFIED: Zammit  TIME OF NOTIFICATION:0833  RESPONSE:

## 2023-07-19 NOTE — ED Notes (Signed)
Phlebotomy at bedside.

## 2023-07-19 NOTE — ED Triage Notes (Signed)
PT BIB EMS.c/o of having leg pain for weeks now. States being feeling cold. Pain 10/10. Hx of DM, and HTN. States last time he took his insulin was yesterday morning.   BP 220 palpated, HR 84, RR 14, Spo2 98%, CBG 420

## 2023-07-19 NOTE — ED Provider Notes (Signed)
[name] [name]   CSN: [id number] Arrival date & time: 07/19/23  2130     History  Chief Complaint  Patient presents with   Leg Pain    [name] [name] is a 37 y.o. 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  When he went to go evaluate the patient patient denied any leg pain but states that he has had a cough and felt warm over the past 6 hours.  Patient does not know if he has been coughing anything up or if there have been sick contacts.  Patient states he last took his insulin yesterday morning.  Patient was excessively somnolent on exam and was unwilling to answer any further questions by this provider which limits history taking.  Home Medications Prior to Admission medications   Medication Sig Start Date End Date Taking? Authorizing Provider  blood glucose meter kit and supplies KIT Use to check blood sugar three times daily 05/14/23   [name], Deno Lunger, MD  Glucose Blood (BLOOD GLUCOSE TEST STRIPS) STRP Use test strip to check blood sugar three times daily 07/05/23   Standley Brooking, MD  insulin aspart (NOVOLOG) 100 UNIT/ML FlexPen Inject 8 Units into the skin 3 (three) times daily with meals. Only take if eating a meal AND Blood Glucose (BG) is 80 or higher. 07/05/23 09/03/23  Standley Brooking, MD  insulin glargine-yfgn (SEMGLEE) 100 UNIT/ML Pen Inject 25 Units into the skin at bedtime. 07/05/23 09/03/23  Standley Brooking, MD  Insulin Pen Needle (PEN NEEDLES) 31G X 5 MM MISC Use  three times daily 07/05/23   Standley Brooking, MD  Lancet Device MISC 1 each by Does not apply route 3 (three) times daily. May dispense any manufacturer covered by patient's insurance. 05/14/23   [name], Deno Lunger, MD  Lancets MISC Use to check blood sugar three times daily 07/05/23   Standley Brooking, MD  lisinopril (ZESTRIL) 20 MG tablet Take 1 tablet (20 mg total) by mouth daily.  05/14/23 07/03/23  [name], Deno Lunger, MD      Allergies    Patient has no known allergies.    Review of Systems   Review of Systems  Physical Exam Updated Vital Signs BP (!) 159/95   Pulse (!) 109   Temp (!) 101.1 F (38.4 C) (Oral)   Resp 18   Ht 5\' 11"  (1.803 m)   Wt 97.5 kg   SpO2 100%   BMI 29.99 kg/m  Physical Exam Vitals reviewed.  Constitutional:      General: He is not in acute distress.    Comments: Somnolent Febrile  HENT:     Head: Normocephalic and atraumatic.  Eyes:     Extraocular Movements: Extraocular movements intact.     Conjunctiva/sclera: Conjunctivae normal.     Pupils: Pupils are equal, round, and reactive to light.  Cardiovascular:     Rate and Rhythm: Regular rhythm. Tachycardia present.     Pulses: Normal pulses.     Heart sounds: Normal heart sounds.     Comments: 2+ bilateral radial/dorsalis pedis pulses with increased rate Pulmonary:     Effort: Pulmonary effort is normal. No respiratory distress.     Breath sounds: Normal breath sounds.  Abdominal:     Palpations: Abdomen is soft.     Tenderness: There is no abdominal tenderness. There is no guarding or rebound.  Musculoskeletal:  General: Normal range of motion.     Cervical back: Normal range of motion and neck supple.     Comments: 5 out of 5 bilateral grip/leg extension strength  Skin:    General: Skin is warm and dry.     Capillary Refill: Capillary refill takes less than 2 seconds.     Comments: No wounds or skin color changes noted on the legs No calf tenderness Or leg swelling  Neurological:     Comments: Sensation intact in all 4 limbs     ED Results / Procedures / Treatments   Labs (all labs ordered are listed, but only abnormal results are displayed) Labs Reviewed  CBG MONITORING, ED    EKG None  Radiology No results found.  Procedures .Critical Care  Performed by: Netta Corrigan, PA-C Authorized by: Netta Corrigan, PA-C   Critical care  provider statement:    Critical care time (minutes):  40   Critical care time was exclusive of:  Separately billable procedures and treating other patients   Critical care was necessary to treat or prevent imminent or life-threatening deterioration of the following conditions:  Sepsis and metabolic crisis   Critical care was time spent personally by me on the following activities:  Blood draw for specimens, development of treatment plan with patient or surrogate, discussions with consultants, evaluation of patient's response to treatment, examination of patient, obtaining history from patient or surrogate, review of old charts, re-evaluation of patient's condition, pulse oximetry, ordering and review of radiographic studies, ordering and review of laboratory studies and ordering and performing treatments and interventions   I assumed direction of critical care for this patient from another provider in my specialty: no     Care discussed with: admitting provider       Medications Ordered in ED Medications - No data to display  ED Course/ Medical Decision Making/ A&P                                 Medical Decision Making Amount and/or Complexity of Data Reviewed Labs: ordered. Radiology: ordered. ECG/medicine tests: ordered.  Risk OTC drugs. Prescription drug management. Decision regarding hospitalization.   Barbette Or 37 y.o. presented today for sepsis.  Working DDx that I considered at this time includes, but not limited to, sepsis, bacteremia, UTI, pneumonia, meningitis/encephalitis, cellulitis, ACS, myocarditis, acidosis, dehydration, electrolyte abnormalities.  R/o DDx: sepsis, bacteremia, UTI, pneumonia, meningitis/encephalitis, cellulitis, ACS, myocarditis, acidosis, dehydration, electrolyte abnormalities: These are considered less likely due to history of present illness, physical exam, labs/imaging findings.  Review of prior external notes: 07/12/2023 ED  Unique Tests  and My Interpretation: CBC: Unremarkable CMP: Hyperglycemia 369 CBG: 330 Lactic acid: 2.1, 1.7 UA: Unremarkable Chest x-ray: No acute findings Blood cultures: Pending Respiratory panel: Influenza B positive EKG: Sinus tachycardia 108 bpm, no ST elevations or depressions noted  Social Determinants of Health: EtOH/Substance Abuse  Discussion with Independent Historian: None  Discussion of Management of Tests:  Ikram, MD hospitalist  Risk: High: Hospitalization  Risk Stratification Score: none  Staffed with Zammit, MD  Plan: On exam patient was septic on arrival.  On exam patient excessively somnolent tachycardic at 109 with a fever of 101.1 F.  Patient was excessively somnolent on exam and was unable to stay awake long enough to answer this provider's questions outside of saying that he has had a cough and felt warm over the past 6 hours.  Triage [name] states that patient is having leg pain however patient adamantly denied that with me and I do not see any wounds or abnormalities in his legs.  Patient does have history of diabetes and his glucose upon arrival was 330.  Due to limited history along with vitals and physical exam sepsis was initiated as patient could be septic due to pneumonia causing his sugars to be elevated along with noncompliance with his insulin.  Patient was given Tylenol for his fever.  Fluids were given which should also help with his glucose.  Labs were obtained which also will show if patient is in DKA.  Nursing staff is only able to give 1 line and at this time so we will obtain cultures now and then give antibiotics and fluids.  Patient's lactic acid came back elevated at 2.1 however patient is already receiving fluids.  Patient has not yet received antibiotics however did test positive for flu B which would explain his symptoms so antibiotics were canceled.  Patient's lactic acid did come down however patient had rising glucose.  Patient stated that he was not  eating any food however nursing noted that patient was eating hot Cheetos which would cause his glucose to go.  Will give patient his 8 units of NovoLog here and recheck a CBC G to see if it is coming down.  Patient's remained tachycardic at 115 and so if after 1 more liter of fluids does not come back down will admit for persistent tachycardia that could be from methamphetamines along with flu and noncompliance with his diabetic medication.  Patient's repeat CBG was 398.  Patient still tachycardic at 116 in the room I received a third bolus and at this point do feel patient would benefit from admission to receive fluids and make sure he is getting his diabetes medications.  Patient had a temperature of 100.2 F and so we will give Tylenol.  Will consult hospitalist.  I spoke to the hospitalist and patient was accepted for admission.  Patient stable for admission.  This chart was dictated using voice recognition software.  Despite best efforts to proofread,  errors can occur which can change the documentation meaning.         Final Clinical Impression(s) / ED Diagnoses Final diagnoses:  None    Rx / DC Orders ED Discharge Orders     None         Remi Deter 07/19/23 1235    Bethann Berkshire, MD 07/21/23 1058

## 2023-07-19 NOTE — ED Notes (Signed)
Dr. Kirby Crigler made aware of patient's fever and the last time patient had tylenol. Order placed to give ibuprofen. MD ok to give the ibuprofen for fever.

## 2023-07-20 DIAGNOSIS — Z91148 Patient's other noncompliance with medication regimen for other reason: Secondary | ICD-10-CM | POA: Diagnosis not present

## 2023-07-20 DIAGNOSIS — A4189 Other specified sepsis: Secondary | ICD-10-CM | POA: Diagnosis present

## 2023-07-20 DIAGNOSIS — E1165 Type 2 diabetes mellitus with hyperglycemia: Secondary | ICD-10-CM | POA: Diagnosis present

## 2023-07-20 DIAGNOSIS — Z5900 Homelessness unspecified: Secondary | ICD-10-CM | POA: Diagnosis not present

## 2023-07-20 DIAGNOSIS — J101 Influenza due to other identified influenza virus with other respiratory manifestations: Secondary | ICD-10-CM | POA: Diagnosis present

## 2023-07-20 DIAGNOSIS — E785 Hyperlipidemia, unspecified: Secondary | ICD-10-CM | POA: Diagnosis present

## 2023-07-20 DIAGNOSIS — I10 Essential (primary) hypertension: Secondary | ICD-10-CM | POA: Diagnosis present

## 2023-07-20 DIAGNOSIS — A419 Sepsis, unspecified organism: Secondary | ICD-10-CM | POA: Diagnosis present

## 2023-07-20 DIAGNOSIS — R197 Diarrhea, unspecified: Secondary | ICD-10-CM | POA: Diagnosis present

## 2023-07-20 DIAGNOSIS — M79606 Pain in leg, unspecified: Secondary | ICD-10-CM | POA: Diagnosis present

## 2023-07-20 DIAGNOSIS — Z794 Long term (current) use of insulin: Secondary | ICD-10-CM | POA: Diagnosis not present

## 2023-07-20 LAB — GLUCOSE, CAPILLARY
Glucose-Capillary: 298 mg/dL — ABNORMAL HIGH (ref 70–99)
Glucose-Capillary: 236 mg/dL — ABNORMAL HIGH (ref 70–99)
Glucose-Capillary: 343 mg/dL — ABNORMAL HIGH (ref 70–99)
Glucose-Capillary: 69 mg/dL — ABNORMAL LOW (ref 70–99)
Glucose-Capillary: 229 mg/dL — ABNORMAL HIGH (ref 70–99)

## 2023-07-20 MED ORDER — LACTATED RINGERS IV SOLN: INTRAVENOUS | Status: AC

## 2023-07-20 NOTE — Plan of Care (Signed)

## 2023-07-20 NOTE — Hospital Course (Signed)
HPI: [name] [name] is a 37 y.o. male with medical history significant for substance abuse, homelessness, poorly controlled insulin-dependent type 2 diabetes with multiple hospital admissions for DKA/HHS was noted to the hospital by EMS with complaints of leg pain and somnolence.  ED provider reported that the patient was not feeling well over several days and was feverish with cough.  In the emergency department, he is meeting sepsis criteria.  Tested positive for influenza B.  Patient had hyperglycemia but no acidosis with initial lactate of 2.1.  Patient received 3 L of IV fluid but was still tachycardic.  Urine drug screen was positive for amphetamines.  Patient was then admitted hospital for further evaluation and treatment.    Assessment and plan.  Severe sepsis secondary to influenza B.-meeting criteria at the time of admission with tachycardia, fever, lactate 2.1.  Will provide supportive care.  Hold off with antibiotic.  Follow blood cultures.  Continue Tamiflu.   Somnolence- She did have pinpoint pupils on presentation with tachycardia and UDS positive for amphetamines.  Will continue to monitor.  Poorly controlled insulin-dependent type 2 diabetes- Patient with multiple hospitalizations for DKA HHS latest hemoglobin A1c of 12.4.  Continue diabetic diet, resume home insulin regimen with sliding scale.    Hypertension-continue lisinopril.

## 2023-07-20 NOTE — Progress Notes (Addendum)
PROGRESS NOTE    [name] [name]  WGN:[id number] DOB: [birth year] DOA: 07/19/2023 PCP: [name], NP    Brief Narrative:   HPI: [name] [name] is a 37 y.o. male with medical history significant for substance abuse, homelessness, poorly controlled insulin-dependent type 2 diabetes with multiple hospital admissions for DKA/HHS was noted to the hospital by EMS with complaints of leg pain and somnolence.  ED provider reported that the patient was not feeling well over several days and was feverish with cough.  In the emergency department, he is meeting sepsis criteria.  Tested positive for influenza B.  Patient had hyperglycemia but no acidosis with initial lactate of 2.1.  Patient received 3 L of IV fluid but was still tachycardic.  Urine drug screen was positive for amphetamines.  Patient was then admitted hospital for further evaluation and treatment.    Assessment and plan.  Severe sepsis secondary to influenza B.-meeting criteria at the time of admission with tachycardia, fever, lactate 2.1.  Will provide supportive care.  Hold off with antibiotic.  Follow blood cultures.  Continue Tamiflu.  Patient still continues to have high-grade fever with diaphoresis.  Continue IV fluids.   Somnolence- She did have pinpoint pupils on presentation with tachycardia and UDS positive for amphetamines.  Will continue to monitor.  Appears to be more alert awake and Communicative.  Poorly controlled insulin-dependent type 2 diabetes hyperglycemia- Patient with multiple hospitalizations for DKA HHS latest hemoglobin A1c of 12.4.  Continue diabetic diet, resume home insulin regimen with sliding scale.  Has been started on long-acting insulin.  Diarrhea.  Could be secondary to viral illness.  Continue to support with IV fluids.  Closely monitor.   Hypertension-continue lisinopril.   Homelessness.      DVT prophylaxis: enoxaparin (LOVENOX) injection 40 mg Start: 07/19/23 2200 SCDs Start: 07/19/23  1246   Code Status:     Code Status: Full Code  Disposition: Home likely in 1 to 2 days  Status is: Observation  The patient will require care spanning > 2 midnights and should be moved to inpatient because: Fever, diaphoresis, pending clinical improvement, follow cultures.   Family Communication: None at bedside  Consultants:  None  Procedures:  None  Antimicrobials:  Tamiflu  Anti-infectives (From admission, onward)    Start     Dose/Rate Route Frequency Ordered Stop   07/19/23 1400  oseltamivir (TAMIFLU) capsule 75 mg        75 mg Oral 2 times daily 07/19/23 1249 07/24/23 0959   07/19/23 0730  cefTRIAXone (ROCEPHIN) 2 g in sodium chloride 0.9 % 100 mL IVPB  Status:  Discontinued        2 g 200 mL/hr over 30 Minutes Intravenous Every 24 hours 07/19/23 0719 07/19/23 0848   07/19/23 0730  azithromycin (ZITHROMAX) 500 mg in sodium chloride 0.9 % 250 mL IVPB  Status:  Discontinued        500 mg 250 mL/hr over 60 Minutes Intravenous Every 24 hours 07/19/23 0719 07/19/23 0848        Subjective: Today, patient was seen and examined at bedside.  Patient states that he feels diaphoretic,  febrile, with generalized body aches and pains.  Has had a big loose watery diarrhea and 1 episode of vomiting.  Objective: Vitals:   07/19/23 1846 07/20/23 0104 07/20/23 0522 07/20/23 0900  BP: (!) 118/54 128/79 (!) 148/85 (!) 142/75  Pulse: (!) 117 (!) 102 (!) 107 (!) 103  Resp:  17 15   Temp:  100.2 F (37.9 C) 97.7 F (36.5 C) 99.5 F (37.5 C) 98.7 F (37.1 C)  TempSrc: Oral Oral Oral Oral  SpO2: 99% 96% 97% 97%  Weight: 98.5 kg     Height: 5\' 11"  (1.803 m)       Intake/Output Summary (Last 24 hours) at 07/20/2023 1159 Last data filed at 07/20/2023 0900 Gross per 24 hour  Intake 480 ml  Output --  Net 480 ml   Filed Weights   07/19/23 0638 07/19/23 1846  Weight: 97.5 kg 98.5 kg    Physical Examination: Body mass index is 30.29 kg/m.   General:  obese built, not  in obvious distress, diaphoretic, redness of the eyes. HENT:   No scleral pallor or icterus noted. Oral mucosa is moist.  Chest:  Clear breath sounds. No crackles or wheezes.  CVS: S1 &S2 heard. No murmur.  Regular rate and rhythm.  Tachycardic. Abdomen: Soft, nontender, nondistended.  Nonspecific tenderness noted.  Bowel sounds are heard.   Extremities: No cyanosis, clubbing or edema.  Peripheral pulses are palpable. Psych: Alert, awake and oriented. CNS:  No cranial nerve deficits.  Power equal in all extremities.   Skin: Warm and dry.  No rashes noted.  Data Reviewed:   CBC: Recent Labs  Lab 07/13/23 2030 07/19/23 0852  WBC 7.1 4.5  NEUTROABS  --  3.1  HGB 14.9 13.0  HCT 43.6 39.0  MCV 82.1 85.3  PLT 307 172    Basic Metabolic Panel: Recent Labs  Lab 07/13/23 2030 07/19/23 0852  NA 139 134*  K 4.4 4.1  CL 104 98  CO2 24 25  GLUCOSE 88 369*  BUN 22* 12  CREATININE 1.23 1.16  CALCIUM 9.0 8.6*    Liver Function Tests: Recent Labs  Lab 07/13/23 2030 07/19/23 0852  AST 19 18  ALT 13 12  ALKPHOS 72 63  BILITOT 0.5 0.8  PROT 6.6 6.9  ALBUMIN 3.3* 3.4*     Radiology Studies: DG Chest Port 1 View Result Date: 07/19/2023 CLINICAL DATA:  Questionable sepsis.  Evaluate for abnormality. EXAM: PORTABLE CHEST 1 VIEW COMPARISON:  05/20/2023 FINDINGS: Heart size and mediastinal contours are unremarkable. No pleural fluid, interstitial edema or airspace disease. The visualized osseous structures appear unremarkable. IMPRESSION: No active disease. Electronically Signed   By: Signa Kell M.D.   On: 07/19/2023 07:53      LOS: 0 days     Joycelyn Das, MD [facility] Available via Epic secure chat 7am-7pm After these hours, please refer to coverage provider listed on [email or web address] 07/20/2023, 11:59 AM

## 2023-07-21 ENCOUNTER — Other Ambulatory Visit (HOSPITAL_COMMUNITY): Payer: Self-pay

## 2023-07-21 DIAGNOSIS — J101 Influenza due to other identified influenza virus with other respiratory manifestations: Secondary | ICD-10-CM | POA: Diagnosis not present

## 2023-07-21 LAB — BASIC METABOLIC PANEL
Anion gap: 8 (ref 5–15)
BUN: 15 mg/dL (ref 6–20)
CO2: 22 mmol/L (ref 22–32)
Calcium: 8.1 mg/dL — ABNORMAL LOW (ref 8.9–10.3)
Chloride: 101 mmol/L (ref 98–111)
Creatinine, Ser: 0.88 mg/dL (ref 0.61–1.24)
GFR, Estimated: >60 mL/min (ref >60)
Glucose, Bld: 259 mg/dL — ABNORMAL HIGH (ref 70–99)
Potassium: 3.8 mmol/L (ref 3.5–5.1)
Sodium: 131 mmol/L — ABNORMAL LOW (ref 135–145)

## 2023-07-21 LAB — CBC
HCT: 43.4 % (ref 39.0–52.0)
Hemoglobin: 13.9 g/dL (ref 13.0–17.0)
MCH: 27.6 pg (ref 26.0–34.0)
MCHC: 32 g/dL (ref 30.0–36.0)
MCV: 86.1 fL (ref 80.0–100.0)
Platelets: 145 10*3/uL — ABNORMAL LOW (ref 150–400)
RBC: 5.04 MIL/uL (ref 4.22–5.81)
RDW: 12.6 % (ref 11.5–15.5)
WBC: 4.5 10*3/uL (ref 4.0–10.5)
nRBC: 0 % (ref 0.0–0.2)

## 2023-07-21 LAB — MAGNESIUM: Magnesium: 1.9 mg/dL (ref 1.7–2.4)

## 2023-07-21 LAB — GLUCOSE, CAPILLARY
Glucose-Capillary: 290 mg/dL — ABNORMAL HIGH (ref 70–99)
Glucose-Capillary: 259 mg/dL — ABNORMAL HIGH (ref 70–99)
Glucose-Capillary: 321 mg/dL — ABNORMAL HIGH (ref 70–99)

## 2023-07-21 MED ORDER — IBUPROFEN 600 MG PO TABS
600.0000 mg | ORAL_TABLET | Freq: Four times a day (QID) | ORAL | 0 refills | Status: DC | PRN
  Filled 2023-07-21: qty 30, 8d supply, fill #0

## 2023-07-21 MED ORDER — OSELTAMIVIR PHOSPHATE 75 MG PO CAPS
75.0000 mg | ORAL_CAPSULE | Freq: Two times a day (BID) | ORAL | 0 refills | Status: AC
  Filled 2023-07-21: qty 6, 3d supply, fill #0

## 2023-07-21 MED ORDER — ALBUTEROL SULFATE HFA 108 (90 BASE) MCG/ACT IN AERS
2.0000 | INHALATION_SPRAY | Freq: Four times a day (QID) | RESPIRATORY_TRACT | 0 refills | Status: DC | PRN
Start: 2023-07-21 — End: 2023-09-28
  Filled 2023-07-21: qty 6.7, 25d supply, fill #0

## 2023-07-21 NOTE — TOC Transition Note (Signed)
Transition of Care [facility]) - Discharge Note   Patient Details  Name: [name] [name] MRN: [id number] Date of Birth: Jan 23, 1986  [facility] CM/SW Contact:  Howell Rucks, RN Phone Number: 07/21/2023, 12:31 PM   Clinical Narrative:   DC to home order. Met with pt at bedside to introduce role of TOC/NCM and review for dc planning, pt Homeless. Pt reports he will be staying with a relative at discharge, requested  bus passes for transportation. TOC consult for Substance Abuse Resources, pt agreeable, SA resources added to AVS. No further TOC needs identified.     Final next level of care: Home/Self Care Barriers to Discharge: Barriers Resolved   Patient Goals and CMS Choice Patient states their goals for this hospitalization and ongoing recovery are:: stay with cousin          Discharge Placement                       Discharge Plan and Services Additional resources added to the After Visit Summary for                                       Social Drivers of Health (SDOH) Interventions SDOH Screenings   Food Insecurity: Patient Unable To Answer (07/19/2023)  Recent Concern: Food Insecurity - Food Insecurity Present (07/03/2023)  Housing: Patient Unable To Answer (07/19/2023)  Recent Concern: Housing - High Risk (05/14/2023)  Transportation Needs: Patient Unable To Answer (07/19/2023)  Recent Concern: Transportation Needs - Unmet Transportation Needs (07/03/2023)  Utilities: Patient Unable To Answer (07/19/2023)  Recent Concern: Utilities - At Risk (07/03/2023)  Tobacco Use: Low Risk  (07/19/2023)     Readmission Risk Interventions    07/21/2023   12:30 PM 07/04/2023   12:49 PM 06/28/2023    6:34 PM  Readmission Risk Prevention Plan  Transportation Screening Complete Complete Complete  Medication Review (RN Care Manager) Complete Complete Referral to Pharmacy  PCP or Specialist appointment within 3-5 days of discharge Complete Complete  Complete  HRI or Home Care Consult Complete Complete Not Complete  HRI or Home Care Consult Pt Refusal Comments N/A  Not eligible for [facility]  SW Recovery Care/Counseling Consult Complete Complete Complete  Palliative Care Screening Not Applicable Not Applicable Not Applicable  Skilled Nursing Facility Not Applicable Not Applicable Not Applicable

## 2023-07-21 NOTE — Discharge Summary (Signed)
Physician Discharge Summary  [name] [name]:[id number] DOB: April 04, 1986 DOA: 07/19/2023  PCP: [name] Sharps, NP  Admit date: 07/19/2023 Discharge date: 07/21/2023  Admitted From: Home  Discharge disposition: Home   Recommendations for Outpatient Follow-Up:   Follow up with your primary care provider in one week.  Check CBC, BMP, magnesium in the next visit   Discharge Diagnosis:   Principal Problem:   Influenza B Active Problems:   Sepsis (HCC)   Discharge Condition: Improved.  Diet recommendation:   Regular.  Wound care: None.  Code status: Full.   History of Present Illness:   [name] [name] is a 37 y.o. male with medical history significant for substance abuse, homelessness, poorly controlled insulin-dependent type 2 diabetes with multiple hospital admissions for DKA/HHS was noted to the hospital by EMS with complaints of leg pain and somnolence.  ED provider reported that the patient was not feeling well over several days and was feverish with cough.  In the ED, he met sepsis criteria.  Tested positive for influenza B.  Patient had hyperglycemia but no acidosis with initial lactate of 2.1.  Patient received 3 L of IV fluid but was still tachycardic.  Urine drug screen was positive for amphetamines.  Patient was then admitted hospital for further evaluation and treatment.     Hospital Course:   Following conditions were addressed during hospitalization as listed below,  Severe sepsis secondary to influenza B. meeting criteria at the time of admission with tachycardia, fever, lactate 2.1.  Blood cultures were negative in 2 days.  Patient was initiated on Tamiflu during hospitalization which will be continued on discharge to complete 5-day course.  At this time patient overall feels better.  Will provide symptomatic treatment on discharge.    Somnolence-  tachycardia and UDS positive for amphetamines.  Improved mentation at this time at baseline.  Poorly  controlled insulin-dependent type 2 diabetes hyperglycemia- Patient with multiple hospitalizations for DKA HHS latest hemoglobin A1c of 12.4.  Plan to continue insulin on discharge.    Diarrhea.  Could be secondary to viral illness.  Improved.  Hypertension-continue lisinopril.   Disposition.  At this time, patient is stable for disposition home with outpatient PCP follow-up.  Medical Consultants:   None.  Procedures:    None Subjective:   Today, patient was seen and examined at bedside.  Feels better than yesterday.  Myalgia mild with temperature of 98.1 F.  Discharge Exam:   Vitals:   07/21/23 0507 07/21/23 0903  BP: (!) 148/93 118/74  Pulse: 89   Resp: 16   Temp: 98.1 F (36.7 C)   SpO2: 98%    Vitals:   07/20/23 1622 07/20/23 2044 07/21/23 0507 07/21/23 0903  BP: 123/72 (!) 133/97 (!) 148/93 118/74  Pulse: (!) 102 91 89   Resp: 20 16 16    Temp: (!) 101 F (38.3 C) 98.8 F (37.1 C) 98.1 F (36.7 C)   TempSrc: Oral Oral Oral   SpO2:  99% 98%   Weight:      Height:       Body mass index is 30.29 kg/m.  General: Alert awake, not in obvious distress, obese built, HENT: pupils equally reacting to light,  No scleral pallor or icterus noted. Oral mucosa is moist.  Chest:  Clear breath sounds.  No crackles or wheezes.  CVS: S1 &S2 heard. No murmur.  Regular rate and rhythm. Abdomen: Soft, nontender, nondistended.  Bowel sounds are heard.   Extremities: No cyanosis, clubbing  or edema.  Peripheral pulses are palpable. Psych: Alert, awake and oriented, normal mood CNS:  No cranial nerve deficits.  Power equal in all extremities.   Skin: Warm and dry.  No rashes noted.  The results of significant diagnostics from this hospitalization (including imaging, microbiology, ancillary and laboratory) are listed below for reference.     Diagnostic Studies:   DG Chest Port 1 View Result Date: 07/19/2023 CLINICAL DATA:  Questionable sepsis.  Evaluate for abnormality.  EXAM: PORTABLE CHEST 1 VIEW COMPARISON:  05/20/2023 FINDINGS: Heart size and mediastinal contours are unremarkable. No pleural fluid, interstitial edema or airspace disease. The visualized osseous structures appear unremarkable. IMPRESSION: No active disease. Electronically Signed   By: Signa Kell M.D.   On: 07/19/2023 07:53     Labs:   Basic Metabolic Panel: Recent Labs  Lab 07/19/23 0852 07/21/23 0415  NA 134* 131*  K 4.1 3.8  CL 98 101  CO2 25 22  GLUCOSE 369* 259*  BUN 12 15  CREATININE 1.16 0.88  CALCIUM 8.6* 8.1*  MG  --  1.9   GFR Estimated Creatinine Clearance: 136.2 mL/min (by C-G formula based on SCr of 0.88 mg/dL). Liver Function Tests: Recent Labs  Lab 07/19/23 0852  AST 18  ALT 12  ALKPHOS 63  BILITOT 0.8  PROT 6.9  ALBUMIN 3.4*   No results for input(s): "LIPASE", "AMYLASE" in the last 168 hours. No results for input(s): "AMMONIA" in the last 168 hours. Coagulation profile Recent Labs  Lab 07/19/23 1433  INR 1.0    CBC: Recent Labs  Lab 07/19/23 0852 07/21/23 0415  WBC 4.5 4.5  NEUTROABS 3.1  --   HGB 13.0 13.9  HCT 39.0 43.4  MCV 85.3 86.1  PLT 172 145*   Cardiac Enzymes: No results for input(s): "CKTOTAL", "CKMB", "CKMBINDEX", "TROPONINI" in the last 168 hours. BNP: Invalid input(s): "POCBNP" CBG: Recent Labs  Lab 07/20/23 1118 07/20/23 1623 07/20/23 2044 07/20/23 2130 07/21/23 0800  GLUCAP 236* 343* 69* 229* 259*   D-Dimer No results for input(s): "DDIMER" in the last 72 hours. Hgb A1c No results for input(s): "HGBA1C" in the last 72 hours. Lipid Profile No results for input(s): "CHOL", "HDL", "LDLCALC", "TRIG", "CHOLHDL", "LDLDIRECT" in the last 72 hours. Thyroid function studies No results for input(s): "TSH", "T4TOTAL", "T3FREE", "THYROIDAB" in the last 72 hours.  Invalid input(s): "FREET3" Anemia work up No results for input(s): "VITAMINB12", "FOLATE", "FERRITIN", "TIBC", "IRON", "RETICCTPCT" in the last 72  hours. Microbiology Recent Results (from the past 240 hours)  Resp panel by RT-PCR (RSV, Flu A&B, Covid) Anterior Nasal Swab     Status: Abnormal   Collection Time: 07/19/23  7:49 AM   Specimen: Anterior Nasal Swab  Result Value Ref Range Status   SARS Coronavirus 2 by RT PCR NEGATIVE NEGATIVE Final    Comment: (NOTE) SARS-CoV-2 target nucleic acids are NOT DETECTED.  The SARS-CoV-2 RNA is generally detectable in upper respiratory specimens during the acute phase of infection. The lowest concentration of SARS-CoV-2 viral copies this assay can detect is 138 copies/mL. A negative result does not preclude SARS-Cov-2 infection and should not be used as the sole basis for treatment or other patient management decisions. A negative result may occur with  improper specimen collection/handling, submission of specimen other than nasopharyngeal swab, presence of viral mutation(s) within the areas targeted by this assay, and inadequate number of viral copies(<138 copies/mL). A negative result must be combined with clinical observations, patient history, and epidemiological information. The  expected result is Negative.  Fact Sheet for Patients:  BloggerCourse.com  Fact Sheet for Healthcare Providers:  SeriousBroker.it  This test is no t yet approved or cleared by the [place] FDA and  has been authorized for detection and/or diagnosis of SARS-CoV-2 by FDA under an Emergency Use Authorization (EUA). This EUA will remain  in effect (meaning this test can be used) for the duration of the COVID-19 declaration under Section 564(b)(1) of the Act, 21 U.S.C.section 360bbb-3(b)(1), unless the authorization is terminated  or revoked sooner.       Influenza A by PCR NEGATIVE NEGATIVE Final   Influenza B by PCR POSITIVE (A) NEGATIVE Final    Comment: (NOTE) The Xpert Xpress SARS-CoV-2/FLU/RSV plus assay is intended as an aid in the diagnosis  of influenza from Nasopharyngeal swab specimens and should not be used as a sole basis for treatment. Nasal washings and aspirates are unacceptable for Xpert Xpress SARS-CoV-2/FLU/RSV testing.  Fact Sheet for Patients: BloggerCourse.com  Fact Sheet for Healthcare Providers: SeriousBroker.it  This test is not yet approved or cleared by the [place] FDA and has been authorized for detection and/or diagnosis of SARS-CoV-2 by FDA under an Emergency Use Authorization (EUA). This EUA will remain in effect (meaning this test can be used) for the duration of the COVID-19 declaration under Section 564(b)(1) of the Act, 21 U.S.C. section 360bbb-3(b)(1), unless the authorization is terminated or revoked.     Resp Syncytial Virus by PCR NEGATIVE NEGATIVE Final    Comment: (NOTE) Fact Sheet for Patients: BloggerCourse.com  Fact Sheet for Healthcare Providers: SeriousBroker.it  This test is not yet approved or cleared by the [place] FDA and has been authorized for detection and/or diagnosis of SARS-CoV-2 by FDA under an Emergency Use Authorization (EUA). This EUA will remain in effect (meaning this test can be used) for the duration of the COVID-19 declaration under Section 564(b)(1) of the Act, 21 U.S.C. section 360bbb-3(b)(1), unless the authorization is terminated or revoked.  Performed at [facility], [address] [place], [place] [address]   Culture, blood (Routine X 2) w Reflex to ID Panel     Status: None (Preliminary result)   Collection Time: 07/19/23  2:33 PM   Specimen: BLOOD RIGHT HAND  Result Value Ref Range Status   Specimen Description   Final    BLOOD RIGHT HAND Performed at [facility], [address], [place], [place] [address]    Special Requests   Final    BOTTLES DRAWN AEROBIC AND ANAEROBIC Blood Culture adequate  volume Performed at [facility], [address], [place], [place] [address]    Culture   Final    NO GROWTH 2 DAYS Performed at [facility], [address] [place], [place] [address]    Report Status PENDING  Incomplete  Culture, blood (Routine X 2) w Reflex to ID Panel     Status: None (Preliminary result)   Collection Time: 07/19/23  2:36 PM   Specimen: BLOOD LEFT HAND  Result Value Ref Range Status   Specimen Description   Final    BLOOD LEFT HAND Performed at [facility], [address], [place], [place] [address]    Special Requests   Final    BOTTLES DRAWN AEROBIC AND ANAEROBIC Blood Culture adequate volume Performed at [facility], [address], [place], [place] [address]    Culture   Final    NO GROWTH 2  DAYS Performed at [facility], [address], [place], [place] [address]    Report Status PENDING  Incomplete     Discharge Instructions:   Discharge Instructions     Call MD for:  persistant nausea and vomiting   Complete by: As directed    Call MD for:  severe uncontrolled pain   Complete by: As directed    Call MD for:  temperature >100.4   Complete by: As directed    Diet Carb Modified   Complete by: As directed    Discharge instructions   Complete by: As directed    Follow-up with your primary care provider in 1 week.  Seek medical attention for worsening symptoms.   Increase activity slowly   Complete by: As directed       Allergies as of 07/21/2023   No Known Allergies      Medication List     TAKE these medications    Accu-Chek Guide Test test strip Generic drug: glucose blood Use test strip to check blood sugar three times daily   Accu-Chek Guide w/Device Kit Use to check blood sugar three times daily   Accu-Chek Softclix Lancets lancets Use to check blood sugar three times daily   albuterol 108 (90 Base) MCG/ACT inhaler Commonly known as: VENTOLIN HFA Inhale 2 puffs  into the lungs every 6 (six) hours as needed for wheezing or shortness of breath.   B-D UF III MINI PEN NEEDLES 31G X 5 MM Misc Generic drug: Insulin Pen Needle Use  three times daily   ibuprofen 600 MG tablet Commonly known as: ADVIL Take 1 tablet (600 mg total) by mouth every 6 (six) hours as needed for fever or mild pain (pain score 1-3).   Lancet Device Misc 1 each by Does not apply route 3 (three) times daily. May dispense any manufacturer covered by patient's insurance.   lisinopril 20 MG tablet Commonly known as: ZESTRIL Take 1 tablet (20 mg total) by mouth daily.   NovoLOG FlexPen 100 UNIT/ML FlexPen Generic drug: insulin aspart Inject 8 Units into the skin 3 (three) times daily with meals. Only take if eating a meal AND Blood Glucose (BG) is 80 or higher.   oseltamivir 75 MG capsule Commonly known as: TAMIFLU Take 1 capsule (75 mg total) by mouth 2 (two) times daily for 3 days.   Semglee (yfgn) 100 UNIT/ML Pen Generic drug: insulin glargine-yfgn Inject 25 Units into the skin at bedtime.          Time coordinating discharge: 39 minutes  Signed:  Torryn Fiske  [facility] 07/21/2023, 11:22 AM

## 2023-07-21 NOTE — Progress Notes (Signed)
PIV removed as noted. Pt instructed to start dressing for d/c to home- per primary RN pt has bus pass. Pt states he has a place to stay. AVS reviewed w/ pt who verbalized an understanding. Discharge meds in a secure bag  delivered to pt in room by this RN

## 2023-07-21 NOTE — Inpatient Diabetes Management (Signed)
Inpatient Diabetes Program Recommendations  AACE/ADA: New Consensus Statement on Inpatient Glycemic Control (2015)  Target Ranges:  Prepandial:   less than 140 mg/dL      Peak postprandial:   less than 180 mg/dL (1-2 hours)      Critically ill patients:  140 - 180 mg/dL    Latest Reference Range & Units 07/20/23 07:55 07/20/23 11:18 07/20/23 16:23 07/20/23 20:44 07/20/23 21:30  Glucose-Capillary 70 - 99 mg/dL 440 (H)  16 units Novolog  236 (H)  13 units Novolog  343 (H)  19 units Novolog  69 (L) 229 (H)   Semglee HELD  (H): Data is abnormally high (L): Data is abnormally low  Latest Reference Range & Units 07/21/23 08:00  Glucose-Capillary 70 - 99 mg/dL 347 (H)  (H): Data is abnormally high    Admit with:  Severe sepsis Source is likely influenza B  UDS + for Amphetamines  History: DM, Substance abuse, Homelessness   Home DM Meds: Novolog 8 units TID        Semglee 25 units QHS  Current Orders: Novolog Moderate Correction Scale/ SSI (0-15 units) TID AC + HS     Novolog 8 units TID with meals     Semglee 25 units QHS    MD- Note Semglee HELD last PM given CBG was 69 at bedtime CBG 259 this AM  1. Please consider changing Semglee to 20 units Daily (please give this AM)  2. Reduce Novolog SSi to the 0-9 unit Sensitive scale    Well known to the Inpatient Diabetes Team Seen/Counseled by Diabetes Coordinator: 04/20/2023 05/08/2023 05/14/2023--Pt refused to open eyes and speak with DM Coordinator this day  Not sure how to help this pt with his glucose control given his homelessness and substance abuse issues    --Will follow patient during hospitalization--  Ambrose Finland RN, MSN, CDCES Diabetes Coordinator Inpatient Glycemic Control Team Team Pager: [phone] (8a-5p)

## 2023-07-24 LAB — CULTURE, BLOOD (ROUTINE X 2)
Culture: NO GROWTH
Special Requests: ADEQUATE
Special Requests: ADEQUATE

## 2023-07-25 ENCOUNTER — Other Ambulatory Visit: Payer: Self-pay

## 2023-07-25 ENCOUNTER — Emergency Department (HOSPITAL_COMMUNITY): Payer: No Typology Code available for payment source

## 2023-07-25 ENCOUNTER — Emergency Department (HOSPITAL_COMMUNITY)
Admission: EM | Admit: 2023-07-25 | Discharge: 2023-07-25 | Disposition: A | Discharge status: Home / Self Care | Payer: No Typology Code available for payment source | Attending: Emergency Medicine | Admitting: Emergency Medicine

## 2023-07-25 ENCOUNTER — Encounter (HOSPITAL_COMMUNITY): Payer: Self-pay

## 2023-07-25 DIAGNOSIS — R079 Chest pain, unspecified: Secondary | ICD-10-CM | POA: Diagnosis not present

## 2023-07-25 DIAGNOSIS — R0602 Shortness of breath: Secondary | ICD-10-CM | POA: Diagnosis present

## 2023-07-25 DIAGNOSIS — Z743 Need for continuous supervision: Secondary | ICD-10-CM | POA: Diagnosis not present

## 2023-07-25 DIAGNOSIS — I10 Essential (primary) hypertension: Secondary | ICD-10-CM | POA: Diagnosis not present

## 2023-07-25 DIAGNOSIS — R739 Hyperglycemia, unspecified: Secondary | ICD-10-CM

## 2023-07-25 DIAGNOSIS — Z794 Long term (current) use of insulin: Secondary | ICD-10-CM | POA: Diagnosis not present

## 2023-07-25 DIAGNOSIS — E1110 Type 2 diabetes mellitus with ketoacidosis without coma: Secondary | ICD-10-CM | POA: Insufficient documentation

## 2023-07-25 DIAGNOSIS — E1165 Type 2 diabetes mellitus with hyperglycemia: Secondary | ICD-10-CM | POA: Diagnosis not present

## 2023-07-25 DIAGNOSIS — Z79899 Other long term (current) drug therapy: Secondary | ICD-10-CM | POA: Diagnosis not present

## 2023-07-25 DIAGNOSIS — R531 Weakness: Secondary | ICD-10-CM | POA: Diagnosis not present

## 2023-07-25 LAB — BASIC METABOLIC PANEL
Anion gap: 8 (ref 5–15)
BUN: 12 mg/dL (ref 6–20)
CO2: 26 mmol/L (ref 22–32)
Calcium: 9 mg/dL (ref 8.9–10.3)
Chloride: 100 mmol/L (ref 98–111)
Creatinine, Ser: 0.99 mg/dL (ref 0.61–1.24)
GFR, Estimated: >60 mL/min (ref >60)
Glucose, Bld: 520 mg/dL (ref 70–99)
Potassium: 5.1 mmol/L (ref 3.5–5.1)
Sodium: 134 mmol/L — ABNORMAL LOW (ref 135–145)

## 2023-07-25 LAB — CBC WITH DIFFERENTIAL/PLATELET
Abs Immature Granulocytes: 0.03 10*3/uL (ref 0.00–0.07)
Basophils Absolute: 0 10*3/uL (ref 0.0–0.1)
Basophils Relative: 1 %
Eosinophils Absolute: 0.1 10*3/uL (ref 0.0–0.5)
Eosinophils Relative: 1 %
HCT: 47.8 % (ref 39.0–52.0)
Hemoglobin: 15.9 g/dL (ref 13.0–17.0)
Immature Granulocytes: 1 %
Lymphocytes Relative: 52 %
Lymphs Abs: 2.3 10*3/uL (ref 0.7–4.0)
MCH: 28 pg (ref 26.0–34.0)
MCHC: 33.3 g/dL (ref 30.0–36.0)
MCV: 84.3 fL (ref 80.0–100.0)
Monocytes Absolute: 0.5 10*3/uL (ref 0.1–1.0)
Monocytes Relative: 11 %
Neutro Abs: 1.5 10*3/uL — ABNORMAL LOW (ref 1.7–7.7)
Neutrophils Relative %: 34 %
Platelets: 189 10*3/uL (ref 150–400)
RBC: 5.67 MIL/uL (ref 4.22–5.81)
RDW: 12.5 % (ref 11.5–15.5)
WBC: 4.4 10*3/uL (ref 4.0–10.5)
nRBC: 0 % (ref 0.0–0.2)

## 2023-07-25 LAB — BLOOD GAS, VENOUS
Acid-base deficit: 0.4 mmol/L (ref 0.0–2.0)
Bicarbonate: 26.8 mmol/L (ref 20.0–28.0)
Drawn by: 70279
O2 Saturation: 57.3 %
Patient temperature: 37
pCO2, Ven: 52 mm[Hg] (ref 44–60)
pH, Ven: 7.32 (ref 7.25–7.43)
pO2, Ven: 34 mm[Hg] (ref 32–45)

## 2023-07-25 LAB — CBG MONITORING, ED
Glucose-Capillary: 564 mg/dL (ref 70–99)
Glucose-Capillary: 461 mg/dL — ABNORMAL HIGH (ref 70–99)

## 2023-07-25 LAB — TROPONIN I (HIGH SENSITIVITY): Troponin I (High Sensitivity): 4 ng/L (ref <18)

## 2023-07-25 MED ORDER — SODIUM CHLORIDE 0.9 % IV BOLUS
1000.0000 mL | Freq: Once | INTRAVENOUS | Status: AC
  Administered 2023-07-25: 1000 mL via INTRAVENOUS

## 2023-07-25 MED ORDER — INSULIN ASPART 100 UNIT/ML IJ SOLN
10.0000 [IU] | Freq: Once | INTRAMUSCULAR | Status: AC
Start: 2023-07-25 — End: 2023-07-25
  Administered 2023-07-25: 10 [IU] via SUBCUTANEOUS
  Filled 2023-07-25: qty 1

## 2023-07-25 NOTE — Discharge Instructions (Addendum)
You were seen today for high blood sugars.  Your workup today is reassuring.  Make sure that you are taking your insulin as prescribed at home.  Avoid high carbohydrate, high sugar diet.  Make sure that you are staying hydrated.  Follow-up closely with your primary physician.

## 2023-07-25 NOTE — ED Provider Notes (Signed)
[place] EMERGENCY DEPARTMENT AT [facility] Provider Note   CSN: [id number] Arrival date & time: 07/25/23  0258     History  Chief Complaint  Patient presents with   Hyperglycemia    [name] [name] is a 37 y.o. male.  HPI     This is a 37 year old male who presents with multiple complaints including chest pain and shortness of breath.  Also reports that his blood sugars have been high.  He states he has been taking his insulin as directed but he cannot keep his blood sugars down.  Recently recovering from influenza.  Has had some diarrhea but no vomiting.  States that chest pain comes and goes.  It is nonexertional in nature.  Has not had a cough.  Home Medications Prior to Admission medications   Medication Sig Start Date End Date Taking? Authorizing Provider  albuterol (VENTOLIN HFA) 108 (90 Base) MCG/ACT inhaler Inhale 2 puffs into the lungs every 6 (six) hours as needed for wheezing or shortness of breath. 07/21/23   Pokhrel, Rebekah Chesterfield, MD  blood glucose meter kit and supplies KIT Use to check blood sugar three times daily 05/14/23   [name], Deno Lunger, MD  Glucose Blood (BLOOD GLUCOSE TEST STRIPS) STRP Use test strip to check blood sugar three times daily 07/05/23   Standley Brooking, MD  ibuprofen (ADVIL) 600 MG tablet Take 1 tablet (600 mg total) by mouth every 6 (six) hours as needed for fever or mild pain (pain score 1-3). 07/21/23   Pokhrel, Rebekah Chesterfield, MD  insulin aspart (NOVOLOG) 100 UNIT/ML FlexPen Inject 8 Units into the skin 3 (three) times daily with meals. Only take if eating a meal AND Blood Glucose (BG) is 80 or higher. 07/05/23 09/03/23  Standley Brooking, MD  insulin glargine-yfgn (SEMGLEE) 100 UNIT/ML Pen Inject 25 Units into the skin at bedtime. 07/05/23 09/03/23  Standley Brooking, MD  Insulin Pen Needle (PEN NEEDLES) 31G X 5 MM MISC Use  three times daily 07/05/23   Standley Brooking, MD  Lancet Device MISC 1 each by Does not apply route 3 (three) times  daily. May dispense any manufacturer covered by patient's insurance. 05/14/23   [name], Deno Lunger, MD  Lancets MISC Use to check blood sugar three times daily 07/05/23   Standley Brooking, MD  lisinopril (ZESTRIL) 20 MG tablet Take 1 tablet (20 mg total) by mouth daily. Patient not taking: Reported on 07/19/2023 05/14/23 07/03/23  Marinda Elk, MD      Allergies    Patient has no known allergies.    Review of Systems   Review of Systems  Constitutional:  Negative for fever.  Respiratory:  Positive for shortness of breath. Negative for cough.   Cardiovascular:  Positive for chest pain.  Gastrointestinal:  Positive for diarrhea. Negative for abdominal pain, nausea and vomiting.  All other systems reviewed and are negative.   Physical Exam Updated Vital Signs BP (!) 150/122   Pulse 87   Temp 98.6 F (37 C) (Oral)   Resp 12   Ht 1.803 m (5\' 11" )   Wt 95.3 kg   SpO2 100%   BMI 29.29 kg/m  Physical Exam Vitals and nursing note reviewed.  Constitutional:      Appearance: He is well-developed. He is not ill-appearing.  HENT:     Head: Normocephalic and atraumatic.     Mouth/Throat:     Mouth: Mucous membranes are dry.  Eyes:     Pupils: Pupils  are equal, round, and reactive to light.  Cardiovascular:     Rate and Rhythm: Normal rate and regular rhythm.     Heart sounds: Normal heart sounds. No murmur heard. Pulmonary:     Effort: Pulmonary effort is normal. No respiratory distress.     Breath sounds: Normal breath sounds. No wheezing.  Abdominal:     General: Bowel sounds are normal.     Palpations: Abdomen is soft.     Tenderness: There is no abdominal tenderness. There is no rebound.  Musculoskeletal:     Cervical back: Neck supple.  Lymphadenopathy:     Cervical: No cervical adenopathy.  Skin:    General: Skin is warm and dry.  Neurological:     Mental Status: He is alert and oriented to person, place, and time.  Psychiatric:        Mood and Affect: Mood  normal.     ED Results / Procedures / Treatments   Labs (all labs ordered are listed, but only abnormal results are displayed) Labs Reviewed  CBC WITH DIFFERENTIAL/PLATELET - Abnormal; Notable for the following components:      Result Value   Neutro Abs 1.5 (*)    All other components within normal limits  CBG MONITORING, ED - Abnormal; Notable for the following components:   Glucose-Capillary 564 (*)    All other components within normal limits  CBG MONITORING, ED - Abnormal; Notable for the following components:   Glucose-Capillary 461 (*)    All other components within normal limits  BLOOD GAS, VENOUS  CBC WITH DIFFERENTIAL/PLATELET  BASIC METABOLIC PANEL  CBG MONITORING, ED  TROPONIN I (HIGH SENSITIVITY)  TROPONIN I (HIGH SENSITIVITY)    EKG EKG Interpretation Date/Time:  Friday July 25 2023 03:58:38 EST Ventricular Rate:  101 PR Interval:  131 QRS Duration:  108 QT Interval:  367 QTC Calculation: 476 R Axis:   -55  Text Interpretation: Sinus tachycardia Left axis deviation Borderline prolonged QT interval Baseline wander in lead(s) V6 Confirmed by Ross Marcus ([id number]) on 07/25/2023 6:17:26 AM  Radiology DG Chest Portable 1 View Result Date: 07/25/2023 CLINICAL DATA:  Chest pain EXAM: PORTABLE CHEST 1 VIEW COMPARISON:  07/19/2023 FINDINGS: The heart size and mediastinal contours are within normal limits. Both lungs are clear. The visualized skeletal structures are unremarkable. IMPRESSION: No active disease. Electronically Signed   By: Charlett Nose M.D.   On: 07/25/2023 03:51    Procedures Procedures    Medications Ordered in ED Medications  insulin aspart (novoLOG) injection 10 Units (has no administration in time range)  sodium chloride 0.9 % bolus 1,000 mL (0 mLs Intravenous Stopped 07/25/23 0507)    ED Course/ Medical Decision Making/ A&P Clinical Course as of 07/25/23 0641  Fri Jul 25, 2023  0640 On recheck, patient is resting comfortably.   Repeat glucose is 461.  Delays in initial troponin and BNP and lab.  Will give subcu insulin and continue to monitor. [CH]    Clinical Course User Index [CH] Jayra Choyce, Mayer Masker, MD                                 Medical Decision Making Amount and/or Complexity of Data Reviewed Labs: ordered. Radiology: ordered.  Risk Prescription drug management.   This patient presents to the ED for concern of hyperglycemia, this involves an extensive number of treatment options, and is a complaint that carries with it a high  risk of complications and morbidity.  I considered the following differential and admission for this acute, potentially life threatening condition.  The differential diagnosis includes hypoglycemia secondary to insulin indiscretion, infection, ACS, PE, pneumothorax, pneumonia, recent viral illness  MDM:    This is a 37 year old male with history of hyperglycemia and DKA in the past with recent admission who presents with concerns for hyperglycemia.  Recently admitted for influenza.  He is overall nontoxic-appearing and vital signs are reassuring.  Blood sugar elevated greater than 400.  Reports compliance.  Chest x-ray without pneumothorax or pneumonia.  EKG without acute arrhythmia or ischemic changes.  CBC is largely reassuring.  VBG is also reassuring without evidence of significant acidosis or low bicarb.   6:42 AM Unfortunately, there is a delay in chemistry labs this morning.  Patient was given a dose of subcu insulin.  Will await chemistries.  Doubt DKA at this point given other peripheral labs available.  (Labs, imaging, consults)  Labs: I Ordered, and personally interpreted labs.  The pertinent results include: CBC, BMP, troponin  Imaging Studies ordered: I ordered imaging studies including chest x-ray I independently visualized and interpreted imaging. I agree with the radiologist interpretation  Additional history obtained from chart review.  External records from  outside source obtained and reviewed including discharge summary  Cardiac Monitoring: The patient was maintained on a cardiac monitor.  If on the cardiac monitor, I personally viewed and interpreted the cardiac monitored which showed an underlying rhythm of: Sinus  Reevaluation: After the interventions noted above, I reevaluated the patient and found that they have :stayed the same  Social Determinants of Health:  lives independently  Disposition: Pending  Co morbidities that complicate the patient evaluation  Past Medical History:  Diagnosis Date   Diabetes mellitus without complication (HCC)    DKA (diabetic ketoacidosis) (HCC) 05/08/2022   HTN (hypertension) 05/08/2022   Hyperlipidemia    Hyperosmolar hyperglycemic state (HHS) (HCC) 05/08/2022   Hypertension    Hypoglycemia 07/02/2019   Pseudohyponatremia 05/08/2022     Medicines Meds ordered this encounter  Medications   sodium chloride 0.9 % bolus 1,000 mL   insulin aspart (novoLOG) injection 10 Units    I have reviewed the patients home medicines and have made adjustments as needed  Problem List / ED Course: Problem List Items Addressed This Visit   None               Final Clinical Impression(s) / ED Diagnoses Final diagnoses:  None    Rx / DC Orders ED Discharge Orders     None         Shon Baton, MD 07/25/23 [phone]

## 2023-07-25 NOTE — ED Triage Notes (Signed)
Pt BIB [facility] from [facility] for all day CP and SOB< pt CBG also reading HIGH, recent hospital admission at [facility] on 12/23 for Hyperglycemia, pt is a type I diabetic. Pt reports [facility] released him too soon as his blood sugar is not under control and is compliant on his medications. Pt denies n/v, but some diarrhea yesterday and increased thirst over the past few days. Pt is A&O x4, NAD noted, pt was ambulatory to [facility] on arrival, steady gait noted.

## 2023-07-30 ENCOUNTER — Encounter (HOSPITAL_COMMUNITY): Payer: Self-pay | Admitting: Emergency Medicine

## 2023-07-30 ENCOUNTER — Other Ambulatory Visit: Payer: Self-pay

## 2023-07-30 ENCOUNTER — Emergency Department (HOSPITAL_COMMUNITY): Payer: No Typology Code available for payment source

## 2023-07-30 ENCOUNTER — Emergency Department (HOSPITAL_COMMUNITY)
Admission: EM | Admit: 2023-07-30 | Discharge: 2023-07-31 | Disposition: A | Discharge status: Home / Self Care | Payer: No Typology Code available for payment source | Attending: Emergency Medicine | Admitting: Emergency Medicine

## 2023-07-30 DIAGNOSIS — R739 Hyperglycemia, unspecified: Secondary | ICD-10-CM

## 2023-07-30 DIAGNOSIS — Z794 Long term (current) use of insulin: Secondary | ICD-10-CM | POA: Diagnosis not present

## 2023-07-30 DIAGNOSIS — Z743 Need for continuous supervision: Secondary | ICD-10-CM | POA: Diagnosis not present

## 2023-07-30 DIAGNOSIS — R079 Chest pain, unspecified: Secondary | ICD-10-CM | POA: Insufficient documentation

## 2023-07-30 DIAGNOSIS — Z20822 Contact with and (suspected) exposure to covid-19: Secondary | ICD-10-CM | POA: Insufficient documentation

## 2023-07-30 DIAGNOSIS — E1165 Type 2 diabetes mellitus with hyperglycemia: Secondary | ICD-10-CM | POA: Diagnosis not present

## 2023-07-30 DIAGNOSIS — Z79899 Other long term (current) drug therapy: Secondary | ICD-10-CM | POA: Diagnosis not present

## 2023-07-30 DIAGNOSIS — R0789 Other chest pain: Secondary | ICD-10-CM | POA: Diagnosis not present

## 2023-07-30 DIAGNOSIS — I10 Essential (primary) hypertension: Secondary | ICD-10-CM | POA: Insufficient documentation

## 2023-07-30 LAB — URINALYSIS, ROUTINE W REFLEX MICROSCOPIC
Bacteria, UA: NONE SEEN
Bilirubin Urine: NEGATIVE
Glucose, UA: >=500 mg/dL — AB
Hgb urine dipstick: NEGATIVE
Ketones, ur: NEGATIVE mg/dL
Leukocytes,Ua: NEGATIVE
Nitrite: NEGATIVE
Protein, ur: NEGATIVE mg/dL
Specific Gravity, Urine: 1.022 (ref 1.005–1.030)
pH: 7 (ref 5.0–8.0)

## 2023-07-30 LAB — RESP PANEL BY RT-PCR (RSV, FLU A&B, COVID)  RVPGX2
Influenza A by PCR: NEGATIVE
Influenza B by PCR: NEGATIVE
Resp Syncytial Virus by PCR: NEGATIVE
SARS Coronavirus 2 by RT PCR: NEGATIVE

## 2023-07-30 LAB — BASIC METABOLIC PANEL
Anion gap: 12 (ref 5–15)
BUN: 19 mg/dL (ref 6–20)
CO2: 24 mmol/L (ref 22–32)
Calcium: 9.4 mg/dL (ref 8.9–10.3)
Chloride: 94 mmol/L — ABNORMAL LOW (ref 98–111)
Creatinine, Ser: 1.27 mg/dL — ABNORMAL HIGH (ref 0.61–1.24)
GFR, Estimated: >60 mL/min (ref >60)
Glucose, Bld: 779 mg/dL (ref 70–99)
Potassium: 4.5 mmol/L (ref 3.5–5.1)
Sodium: 130 mmol/L — ABNORMAL LOW (ref 135–145)

## 2023-07-30 LAB — BLOOD GAS, VENOUS
Acid-Base Excess: 3.7 mmol/L — ABNORMAL HIGH (ref 0.0–2.0)
Bicarbonate: 29.7 mmol/L — ABNORMAL HIGH (ref 20.0–28.0)
Drawn by: 53297
O2 Saturation: 88.1 %
Patient temperature: 36.9
pCO2, Ven: 49 mm[Hg] (ref 44–60)
pH, Ven: 7.39 (ref 7.25–7.43)
pO2, Ven: 57 mm[Hg] — ABNORMAL HIGH (ref 32–45)

## 2023-07-30 LAB — CBC
HCT: 41.8 % (ref 39.0–52.0)
Hemoglobin: 13.8 g/dL (ref 13.0–17.0)
MCH: 27.8 pg (ref 26.0–34.0)
MCHC: 33 g/dL (ref 30.0–36.0)
MCV: 84.1 fL (ref 80.0–100.0)
Platelets: 235 10*3/uL (ref 150–400)
RBC: 4.97 MIL/uL (ref 4.22–5.81)
RDW: 12.5 % (ref 11.5–15.5)
WBC: 4.5 10*3/uL (ref 4.0–10.5)
nRBC: 0 % (ref 0.0–0.2)

## 2023-07-30 LAB — CBG MONITORING, ED
Glucose-Capillary: 545 mg/dL (ref 70–99)
Glucose-Capillary: >600 mg/dL (ref 70–99)

## 2023-07-30 LAB — TROPONIN I (HIGH SENSITIVITY)
Troponin I (High Sensitivity): 5 ng/L (ref <18)
Troponin I (High Sensitivity): 6 ng/L (ref <18)

## 2023-07-30 MED ORDER — POTASSIUM CHLORIDE 10 MEQ/100ML IV SOLN
10.0000 meq | INTRAVENOUS | Status: AC
  Administered 2023-07-30 – 2023-07-31 (×2): 10 meq via INTRAVENOUS
  Filled 2023-07-30 (×2): qty 100

## 2023-07-30 MED ORDER — LACTATED RINGERS IV BOLUS
20.0000 mL/kg | Freq: Once | INTRAVENOUS | Status: AC
  Administered 2023-07-30: 1906 mL via INTRAVENOUS

## 2023-07-30 MED ORDER — INSULIN REGULAR(HUMAN) IN NACL 100-0.9 UT/100ML-% IV SOLN
INTRAVENOUS | Status: DC
  Administered 2023-07-30: 10.5 [IU]/h via INTRAVENOUS
  Filled 2023-07-30: qty 100

## 2023-07-30 NOTE — ED Triage Notes (Signed)
 Pt bib EMS for c/o CP for last couple of hours. EMS reports HTN at 169/119 and blood glucose reading of "high".

## 2023-07-31 DIAGNOSIS — Z79899 Other long term (current) drug therapy: Secondary | ICD-10-CM | POA: Diagnosis not present

## 2023-07-31 DIAGNOSIS — Z794 Long term (current) use of insulin: Secondary | ICD-10-CM | POA: Diagnosis not present

## 2023-07-31 DIAGNOSIS — R739 Hyperglycemia, unspecified: Secondary | ICD-10-CM | POA: Diagnosis not present

## 2023-07-31 DIAGNOSIS — I10 Essential (primary) hypertension: Secondary | ICD-10-CM | POA: Diagnosis not present

## 2023-07-31 DIAGNOSIS — F17210 Nicotine dependence, cigarettes, uncomplicated: Secondary | ICD-10-CM | POA: Diagnosis not present

## 2023-07-31 DIAGNOSIS — E1165 Type 2 diabetes mellitus with hyperglycemia: Secondary | ICD-10-CM | POA: Diagnosis not present

## 2023-07-31 LAB — CBG MONITORING, ED
Glucose-Capillary: 193 mg/dL — ABNORMAL HIGH (ref 70–99)
Glucose-Capillary: 204 mg/dL — ABNORMAL HIGH (ref 70–99)
Glucose-Capillary: 327 mg/dL — ABNORMAL HIGH (ref 70–99)

## 2023-07-31 LAB — OSMOLALITY: Osmolality: 327 mosm/kg (ref 275–295)

## 2023-07-31 NOTE — ED Provider Notes (Signed)
 Insulin drip is off, and CBG appropriate Pt is in no distress Resting comfortably, moves all extremities No vomiting, no signs of DKA He is safe for d/c   Zadie Rhine, MD 07/31/23 [phone]

## 2023-07-31 NOTE — ED Notes (Signed)
 Pt will not keep EKG leads, BP cuff or pulse ox on after multiple reapplication attempts. Pt instructed to keep equipment on.

## 2023-07-31 NOTE — ED Notes (Signed)
 Note entered on 07/31/23 at 1149 - Critical result called for serum osmolality of 327. Dr. Charm Barges notified. No further orders given.

## 2023-07-31 NOTE — ED Provider Notes (Signed)
 [place] EMERGENCY DEPARTMENT AT [facility] Provider Note   CSN: [id number] Arrival date & time: 07/30/23  2028     History  Chief Complaint  Patient presents with   Chest Pain   Hyperglycemia    [name] [name] is a 38 y.o. male.  With a past medical history of type 2 diabetes, hypertension and polysubstance use who presents to the ED for hyperglycemia.  Reports that his sugars have read high over the last 2 days on home glucometer despite compliance with NovoLog  and long-acting insulin .  Also reported chest pain beginning today.  No shortness of breath fevers chills or recent illness.  Denies current drug and alcohol use   Chest Pain Hyperglycemia Associated symptoms: chest pain        Home Medications Prior to Admission medications   Medication Sig Start Date End Date Taking? Authorizing Provider  albuterol  (VENTOLIN  HFA) 108 (90 Base) MCG/ACT inhaler Inhale 2 puffs into the lungs every 6 (six) hours as needed for wheezing or shortness of breath. 07/21/23   Pokhrel, Vernal, MD  blood glucose meter kit and supplies KIT Use to check blood sugar three times daily 05/14/23   [name], [name] PARAS, MD  Glucose Blood (BLOOD GLUCOSE TEST STRIPS) STRP Use test strip to check blood sugar three times daily 07/05/23   Jadine Toribio SQUIBB, MD  ibuprofen  (ADVIL ) 600 MG tablet Take 1 tablet (600 mg total) by mouth every 6 (six) hours as needed for fever or mild pain (pain score 1-3). 07/21/23   Pokhrel, Laxman, MD  insulin  aspart (NOVOLOG ) 100 UNIT/ML FlexPen Inject 8 Units into the skin 3 (three) times daily with meals. Only take if eating a meal AND Blood Glucose (BG) is 80 or higher. 07/05/23 09/03/23  Jadine Toribio SQUIBB, MD  insulin  glargine-yfgn (SEMGLEE ) 100 UNIT/ML Pen Inject 25 Units into the skin at bedtime. 07/05/23 09/03/23  Jadine Toribio SQUIBB, MD  Insulin  Pen Needle (PEN NEEDLES) 31G X 5 MM MISC Use  three times daily 07/05/23   Jadine Toribio SQUIBB, MD  Lancet Device MISC 1 each by  Does not apply route 3 (three) times daily. May dispense any manufacturer covered by patient's insurance. 05/14/23   [name], [name] PARAS, MD  Lancets MISC Use to check blood sugar three times daily 07/05/23   Jadine Toribio SQUIBB, MD  lisinopril  (ZESTRIL ) 20 MG tablet Take 1 tablet (20 mg total) by mouth daily. Patient not taking: Reported on 07/19/2023 05/14/23 07/03/23  [name] PARAS, MD      Allergies    Patient has no known allergies.    Review of Systems   Review of Systems  Cardiovascular:  Positive for chest pain.    Physical Exam Updated Vital Signs BP 124/74   Pulse 96   Temp 98.4 F (36.9 C) (Oral)   Resp 14   Ht 5' 11 (1.803 m)   Wt 95.3 kg   SpO2 96%   BMI 29.29 kg/m  Physical Exam Vitals and nursing note reviewed.  HENT:     Head: Normocephalic and atraumatic.  Eyes:     Pupils: Pupils are equal, round, and reactive to light.  Cardiovascular:     Rate and Rhythm: Normal rate and regular rhythm.  Pulmonary:     Effort: Pulmonary effort is normal.     Breath sounds: Normal breath sounds.  Abdominal:     Palpations: Abdomen is soft.     Tenderness: There is no abdominal tenderness.  Skin:  General: Skin is warm and dry.  Neurological:     Mental Status: He is alert.  Psychiatric:        Mood and Affect: Mood normal.     ED Results / Procedures / Treatments   Labs (all labs ordered are listed, but only abnormal results are displayed) Labs Reviewed  BASIC METABOLIC PANEL - Abnormal; Notable for the following components:      Result Value   Sodium 130 (*)    Chloride 94 (*)    Glucose, Bld 779 (*)    Creatinine, Ser 1.27 (*)    All other components within normal limits  URINALYSIS, ROUTINE W REFLEX MICROSCOPIC - Abnormal; Notable for the following components:   Color, Urine COLORLESS (*)    Glucose, UA >=500 (*)    All other components within normal limits  BLOOD GAS, VENOUS - Abnormal; Notable for the following components:   pO2, Ven 57 (*)     Bicarbonate 29.7 (*)    Acid-Base Excess 3.7 (*)    All other components within normal limits  CBG MONITORING, ED - Abnormal; Notable for the following components:   Glucose-Capillary >600 (*)    All other components within normal limits  CBG MONITORING, ED - Abnormal; Notable for the following components:   Glucose-Capillary 545 (*)    All other components within normal limits  CBG MONITORING, ED - Abnormal; Notable for the following components:   Glucose-Capillary 327 (*)    All other components within normal limits  RESP PANEL BY RT-PCR (RSV, FLU A&B, COVID)  RVPGX2  CBC  OSMOLALITY  TROPONIN I (HIGH SENSITIVITY)  TROPONIN I (HIGH SENSITIVITY)    EKG EKG Interpretation Date/Time:  Wednesday July 30 2023 20:41:54 EST Ventricular Rate:  89 PR Interval:  134 QRS Duration:  109 QT Interval:  374 QTC Calculation: 456 R Axis:   -61  Text Interpretation: Sinus rhythm LAD, consider left anterior fascicular block Probable anteroseptal infarct, old Confirmed by Midge Golas ([id number]) on 07/31/2023 12:08:35 AM  Radiology DG Chest Portable 1 View Result Date: 07/30/2023 CLINICAL DATA:  Chest pain. EXAM: PORTABLE CHEST 1 VIEW COMPARISON:  Multiple priors, most recently 07/25/2023 FINDINGS: The cardiomediastinal contours are normal. The lungs are clear. Pulmonary vasculature is normal. No consolidation, pleural effusion, or pneumothorax. No acute osseous abnormalities are seen. IMPRESSION: No active disease. Electronically Signed   By: Andrea Gasman M.D.   On: 07/30/2023 21:51    Procedures Procedures    Medications Ordered in ED Medications  insulin  regular, human (MYXREDLIN ) 100 units/ 100 mL infusion (5.5 Units/hr Intravenous Rate/Dose Change 07/31/23 0017)  potassium chloride  10 mEq in 100 mL IVPB (10 mEq Intravenous New Bag/Given 07/31/23 0032)  lactated ringers  bolus 1,906 mL (0 mLs Intravenous Stopped 07/30/23 2319)    ED Course/ Medical Decision Making/ A&P Clinical  Course as of 07/31/23 0041  Thu Jul 31, 2023  0039 First troponin of 5 with delta of 6.  Low suspicion for ACS.  COVID flu and influenza all negative.  UA shows glucose urea no UTI.  Blood gas not consistent with DKA.  Patient has remained hemodynamically stable  I, Ozell Marine DO, am transitioning care of this patient to the oncoming provider pending repeat blood glucose, continue monitoring and disposition [MP]    Clinical Course User Index [MP] Marine Ozell LABOR, DO  Medical Decision Making 38 year old male with history as above presenting for hyperglycemia and chest pain.  Afebrile and well-appearing on exam.  Initial blood glucose in the 700s.  Differential diagnosis includes DKA/HHS/hyperglycemia along with ACS, dysrhythmia and pneumonia.  No overt infectious symptoms or clear triggers for uncontrolled blood glucose.  Will provide IV fluids and insulin  and continue to monitor blood glucose.  Will obtain high sensitive troponin, EKG and chest x-ray and continue to monitor on telemetry  Amount and/or Complexity of Data Reviewed Labs: ordered. Radiology: ordered.  Risk Prescription drug management.           Final Clinical Impression(s) / ED Diagnoses Final diagnoses:  Hyperglycemia  Chest pain, unspecified type    Rx / DC Orders ED Discharge Orders     None         Pamella Ozell LABOR, DO 07/31/23 0041

## 2023-07-31 NOTE — Discharge Instructions (Addendum)
 You were seen in the emergency department for high blood sugars and chest pain Your cardiac enzyme and EKG did not show any evidence of a heart attack Your blood sugar improved after fluids and insulin  here Is important that you continue to take your blood pressure and diabetes medications as directed Follow-up with your primary care doctor in 1 week for reevaluation Return to the emerged from for chest pain, trouble breathing, sugars are out of control or any other concerns

## 2023-08-09 ENCOUNTER — Other Ambulatory Visit: Payer: Self-pay

## 2023-08-09 ENCOUNTER — Encounter (HOSPITAL_COMMUNITY): Payer: Self-pay

## 2023-08-09 ENCOUNTER — Emergency Department (HOSPITAL_COMMUNITY)
Admission: EM | Admit: 2023-08-09 | Discharge: 2023-08-10 | Disposition: A | Discharge status: Home / Self Care | Payer: No Typology Code available for payment source | Attending: Emergency Medicine | Admitting: Emergency Medicine

## 2023-08-09 DIAGNOSIS — R531 Weakness: Secondary | ICD-10-CM | POA: Diagnosis not present

## 2023-08-09 DIAGNOSIS — Z794 Long term (current) use of insulin: Secondary | ICD-10-CM | POA: Insufficient documentation

## 2023-08-09 DIAGNOSIS — R739 Hyperglycemia, unspecified: Secondary | ICD-10-CM | POA: Diagnosis not present

## 2023-08-09 DIAGNOSIS — E1165 Type 2 diabetes mellitus with hyperglycemia: Secondary | ICD-10-CM | POA: Insufficient documentation

## 2023-08-09 DIAGNOSIS — I10 Essential (primary) hypertension: Secondary | ICD-10-CM | POA: Diagnosis not present

## 2023-08-09 LAB — CBG MONITORING, ED: Glucose-Capillary: >600 mg/dL (ref 70–99)

## 2023-08-09 MED ORDER — SODIUM CHLORIDE 0.9 % IV BOLUS
1000.0000 mL | Freq: Once | INTRAVENOUS | Status: AC
  Administered 2023-08-10: 1000 mL via INTRAVENOUS

## 2023-08-09 NOTE — ED Provider Notes (Signed)
 [place] EMERGENCY DEPARTMENT AT [facility]  Provider Note  CSN: [id number] Arrival date & time: 08/09/23 2323  History Chief Complaint  Patient presents with   Hyperglycemia    [name] [name] is a 38 y.o. male with history of poorly controlled diabetes with frequent ED visits for same was brought to the ED via EMS from a local motel where apparently police were called because he would not leave another person's room (he is homeless). Police were planning to take him to jail, but he told them his glucose was high and so EMS was called to bring him here instead. Patient reports compliance with his insulin , however he has two empty Flexpens with him, does not have a PCP, always gets his insulin  from hospital visits.    Home Medications Prior to Admission medications   Medication Sig Start Date End Date Taking? Authorizing Provider  albuterol  (VENTOLIN  HFA) 108 (90 Base) MCG/ACT inhaler Inhale 2 puffs into the lungs every 6 (six) hours as needed for wheezing or shortness of breath. 07/21/23   Pokhrel, Vernal, MD  blood glucose meter kit and supplies KIT Use to check blood sugar three times daily 05/14/23   [name], [name] PARAS, MD  Glucose Blood (BLOOD GLUCOSE TEST STRIPS) STRP Use test strip to check blood sugar three times daily 07/05/23   Jadine Toribio SQUIBB, MD  ibuprofen  (ADVIL ) 600 MG tablet Take 1 tablet (600 mg total) by mouth every 6 (six) hours as needed for fever or mild pain (pain score 1-3). 07/21/23   Pokhrel, Laxman, MD  insulin  aspart (NOVOLOG ) 100 UNIT/ML FlexPen Inject 8 Units into the skin 3 (three) times daily with meals. Only take if eating a meal AND Blood Glucose (BG) is 80 or higher. 08/10/23 10/09/23  Roselyn Carlin NOVAK, MD  insulin  glargine-yfgn (SEMGLEE ) 100 UNIT/ML Pen Inject 25 Units into the skin at bedtime. 08/10/23 10/09/23  Roselyn Carlin NOVAK, MD  Insulin  Pen Needle (PEN NEEDLES) 31G X 5 MM MISC Use  three times daily 07/05/23   Jadine Toribio SQUIBB, MD   Lancet Device MISC 1 each by Does not apply route 3 (three) times daily. May dispense any manufacturer covered by patient's insurance. 05/14/23   [name], [name] PARAS, MD  Lancets MISC Use to check blood sugar three times daily 07/05/23   Jadine Toribio SQUIBB, MD  lisinopril  (ZESTRIL ) 20 MG tablet Take 1 tablet (20 mg total) by mouth daily. Patient not taking: Reported on 07/19/2023 05/14/23 07/03/23  [name] PARAS, MD     Allergies    Patient has no known allergies.   Review of Systems   Review of Systems Please see HPI for pertinent positives and negatives  Physical Exam BP (!) 154/104 (BP Location: Left Arm)   Pulse 90   Temp 97.9 F (36.6 C) (Oral)   Resp 16   Ht 5' 11 (1.803 m)   Wt 95.3 kg   SpO2 96%   BMI 29.29 kg/m   Physical Exam Vitals and nursing note reviewed.  Constitutional:      Appearance: Normal appearance.  HENT:     Head: Normocephalic and atraumatic.     Nose: Nose normal.     Mouth/Throat:     Mouth: Mucous membranes are moist.  Eyes:     Extraocular Movements: Extraocular movements intact.     Conjunctiva/sclera: Conjunctivae normal.  Cardiovascular:     Rate and Rhythm: Normal rate.  Pulmonary:     Effort: Pulmonary effort is normal.  Breath sounds: Normal breath sounds.  Abdominal:     General: Abdomen is flat.     Palpations: Abdomen is soft.     Tenderness: There is no abdominal tenderness.  Musculoskeletal:        General: No swelling. Normal range of motion.     Cervical back: Neck supple.  Skin:    General: Skin is warm and dry.  Neurological:     General: No focal deficit present.     Mental Status: He is alert.  Psychiatric:        Mood and Affect: Mood normal.     ED Results / Procedures / Treatments   EKG None  Procedures Procedures  Medications Ordered in the ED Medications  sodium chloride  0.9 % bolus 1,000 mL (0 mLs Intravenous Stopped 08/10/23 0155)  insulin  aspart (novoLOG ) injection 10 Units (10 Units  Intravenous Given 08/10/23 0155)    Initial Impression and Plan  Patient here with chronic hyperglycemia, has had prior DKA. Will check labs and begin IVF.   ED Course   Clinical Course as of 08/10/23 0420  [place] Aug 10, 2023  0052 UA with glucosuria, otherwise normal. CBC is normal.  [CS]  0057 VBG without acidosis.  [CS]  0106 UDS positive for amphetamines, consistent with history.  [CS]  0134 BHB and EtOH are neg.  [CS]  0147 BMP with hyperglycemia, but no acidosis. Will give a dose of insulin  and reassess for improvement.  [CS]  0257 CBG continues to improve, but has dropped to 160. Will continue to monitor to ensure it doesn't get low.  [CS]  0345 CBG remains in normal range on recheck. Patient sleeping comfortably. Plan discharge with refills of his insulin .  [CS]    Clinical Course User Index [CS] Roselyn Carlin NOVAK, MD     MDM Rules/Calculators/A&P Medical Decision Making Given presenting complaint, I considered that admission might be necessary. After review of results from ED lab and/or imaging studies, admission to the hospital is not indicated at this time.    Problems Addressed: Hyperglycemia: chronic illness or injury with exacerbation, progression, or side effects of treatment  Amount and/or Complexity of Data Reviewed Labs: ordered. Decision-making details documented in ED Course.  Risk Prescription drug management. Decision regarding hospitalization.     Final Clinical Impression(s) / ED Diagnoses Final diagnoses:  Hyperglycemia    Rx / DC Orders ED Discharge Orders          Ordered    insulin  aspart (NOVOLOG ) 100 UNIT/ML FlexPen  3 times daily with meals,   Status:  Discontinued        08/10/23 0407    insulin  glargine-yfgn (SEMGLEE ) 100 UNIT/ML Pen  Daily at bedtime,   Status:  Discontinued       Note to Pharmacy: May substitute as needed per insurance.   08/10/23 0407    insulin  aspart (NOVOLOG ) 100 UNIT/ML FlexPen  3 times daily with meals         08/10/23 0419    insulin  glargine-yfgn (SEMGLEE ) 100 UNIT/ML Pen  Daily at bedtime       Note to Pharmacy: May substitute as needed per insurance.   08/10/23 0419             Roselyn Carlin NOVAK, MD 08/10/23 [phone]

## 2023-08-09 NOTE — ED Triage Notes (Addendum)
 EMS got called out by [employer] to [facility] b/c pt refused to leave motel room that was occupied by another individual. Pt is homeless, [employer] was called because pt refused to leave someone else's hotel room, when police arrived, pt claimed to have medical problems and requested to go to ED. Per EMS pt CBG read high

## 2023-08-10 LAB — BASIC METABOLIC PANEL
Anion gap: 9 (ref 5–15)
BUN: 16 mg/dL (ref 6–20)
CO2: 27 mmol/L (ref 22–32)
Calcium: 9.7 mg/dL (ref 8.9–10.3)
Chloride: 93 mmol/L — ABNORMAL LOW (ref 98–111)
Creatinine, Ser: 1.39 mg/dL — ABNORMAL HIGH (ref 0.61–1.24)
GFR, Estimated: >60 mL/min (ref >60)
Glucose, Bld: 632 mg/dL (ref 70–99)
Potassium: 4.5 mmol/L (ref 3.5–5.1)
Sodium: 129 mmol/L — ABNORMAL LOW (ref 135–145)

## 2023-08-10 LAB — URINALYSIS, ROUTINE W REFLEX MICROSCOPIC
Bacteria, UA: NONE SEEN
Bilirubin Urine: NEGATIVE
Glucose, UA: >=500 mg/dL — AB
Hgb urine dipstick: NEGATIVE
Ketones, ur: NEGATIVE mg/dL
Leukocytes,Ua: NEGATIVE
Nitrite: NEGATIVE
Protein, ur: NEGATIVE mg/dL
Specific Gravity, Urine: 1.026 (ref 1.005–1.030)
pH: 6 (ref 5.0–8.0)

## 2023-08-10 LAB — RAPID URINE DRUG SCREEN, HOSP PERFORMED
Amphetamines: POSITIVE — AB
Barbiturates: NOT DETECTED
Benzodiazepines: NOT DETECTED
Cocaine: NOT DETECTED
Opiates: NOT DETECTED
Tetrahydrocannabinol: NOT DETECTED

## 2023-08-10 LAB — CBC WITH DIFFERENTIAL/PLATELET
Abs Immature Granulocytes: 0 10*3/uL (ref 0.00–0.07)
Basophils Absolute: 0 10*3/uL (ref 0.0–0.1)
Basophils Relative: 1 %
Eosinophils Absolute: 0.1 10*3/uL (ref 0.0–0.5)
Eosinophils Relative: 2 %
HCT: 48.9 % (ref 39.0–52.0)
Hemoglobin: 16 g/dL (ref 13.0–17.0)
Immature Granulocytes: 0 %
Lymphocytes Relative: 38 %
Lymphs Abs: 1.7 10*3/uL (ref 0.7–4.0)
MCH: 28 pg (ref 26.0–34.0)
MCHC: 32.7 g/dL (ref 30.0–36.0)
MCV: 85.5 fL (ref 80.0–100.0)
Monocytes Absolute: 0.5 10*3/uL (ref 0.1–1.0)
Monocytes Relative: 12 %
Neutro Abs: 2.2 10*3/uL (ref 1.7–7.7)
Neutrophils Relative %: 47 %
Platelets: 242 10*3/uL (ref 150–400)
RBC: 5.72 MIL/uL (ref 4.22–5.81)
RDW: 13.3 % (ref 11.5–15.5)
WBC: 4.5 10*3/uL (ref 4.0–10.5)
nRBC: 0 % (ref 0.0–0.2)

## 2023-08-10 LAB — BETA-HYDROXYBUTYRIC ACID: Beta-Hydroxybutyric Acid: 0.09 mmol/L (ref 0.05–0.27)

## 2023-08-10 LAB — BLOOD GAS, VENOUS
Acid-Base Excess: 3.4 mmol/L — ABNORMAL HIGH (ref 0.0–2.0)
Bicarbonate: 30.4 mmol/L — ABNORMAL HIGH (ref 20.0–28.0)
Drawn by: 1517
O2 Saturation: 25.1 %
Patient temperature: 36.6
pCO2, Ven: 54 mm[Hg] (ref 44–60)
pH, Ven: 7.36 (ref 7.25–7.43)
pO2, Ven: <31 mm[Hg] — CL (ref 32–45)

## 2023-08-10 LAB — CBG MONITORING, ED
Glucose-Capillary: 426 mg/dL — ABNORMAL HIGH (ref 70–99)
Glucose-Capillary: 160 mg/dL — ABNORMAL HIGH (ref 70–99)
Glucose-Capillary: 136 mg/dL — ABNORMAL HIGH (ref 70–99)

## 2023-08-10 LAB — ETHANOL: Alcohol, Ethyl (B): <10 mg/dL (ref <10)

## 2023-08-10 MED ORDER — INSULIN ASPART 100 UNIT/ML IJ SOLN
10.0000 [IU] | Freq: Once | INTRAMUSCULAR | Status: AC
  Administered 2023-08-10: 10 [IU] via INTRAVENOUS

## 2023-08-10 MED ORDER — INSULIN ASPART 100 UNIT/ML FLEXPEN: 8.0000 [IU] | PEN_INJECTOR | Freq: Three times a day (TID) | SUBCUTANEOUS | 0 refills | Status: DC

## 2023-08-10 MED ORDER — INSULIN ASPART 100 UNIT/ML FLEXPEN
8.0000 [IU] | PEN_INJECTOR | Freq: Three times a day (TID) | SUBCUTANEOUS | 0 refills | Status: DC
  Filled 2023-08-10: qty 30, 125d supply, fill #0

## 2023-08-10 MED ORDER — INSULIN GLARGINE-YFGN 100 UNIT/ML ~~LOC~~ SOPN: 25.0000 [IU] | PEN_INJECTOR | Freq: Every day | SUBCUTANEOUS | 0 refills | Status: DC

## 2023-08-10 MED ORDER — INSULIN GLARGINE-YFGN 100 UNIT/ML ~~LOC~~ SOPN
25.0000 [IU] | PEN_INJECTOR | Freq: Every day | SUBCUTANEOUS | 0 refills | Status: DC
  Filled 2023-08-10: qty 15, 60d supply, fill #0

## 2023-08-10 NOTE — ED Notes (Signed)
 2 empty insulin pens taken from pt and discarded in sharps container. Hospital security called to "check pt pockets" for any other medications per Dr Bernette Mayers request. No more meds found only one needle which was disposed of in sharps container.

## 2023-08-11 ENCOUNTER — Other Ambulatory Visit (HOSPITAL_COMMUNITY): Payer: Self-pay

## 2023-08-12 LAB — CBG MONITORING, ED: Glucose-Capillary: 97 mg/dL (ref 70–99)

## 2023-08-22 ENCOUNTER — Emergency Department (HOSPITAL_COMMUNITY)
Admission: EM | Admit: 2023-08-22 | Discharge: 2023-08-22 | Disposition: A | Discharge status: Home / Self Care | Payer: No Typology Code available for payment source | Attending: Emergency Medicine | Admitting: Emergency Medicine

## 2023-08-22 ENCOUNTER — Other Ambulatory Visit: Payer: Self-pay

## 2023-08-22 DIAGNOSIS — G9341 Metabolic encephalopathy: Secondary | ICD-10-CM | POA: Diagnosis not present

## 2023-08-22 DIAGNOSIS — R739 Hyperglycemia, unspecified: Secondary | ICD-10-CM | POA: Insufficient documentation

## 2023-08-22 DIAGNOSIS — Z79899 Other long term (current) drug therapy: Secondary | ICD-10-CM | POA: Diagnosis not present

## 2023-08-22 DIAGNOSIS — R5381 Other malaise: Secondary | ICD-10-CM | POA: Diagnosis not present

## 2023-08-22 DIAGNOSIS — Z794 Long term (current) use of insulin: Secondary | ICD-10-CM | POA: Insufficient documentation

## 2023-08-22 DIAGNOSIS — I10 Essential (primary) hypertension: Secondary | ICD-10-CM | POA: Diagnosis not present

## 2023-08-22 DIAGNOSIS — E1165 Type 2 diabetes mellitus with hyperglycemia: Secondary | ICD-10-CM | POA: Diagnosis not present

## 2023-08-22 DIAGNOSIS — E1100 Type 2 diabetes mellitus with hyperosmolarity without nonketotic hyperglycemic-hyperosmolar coma (NKHHC): Secondary | ICD-10-CM | POA: Diagnosis not present

## 2023-08-22 DIAGNOSIS — F1994 Other psychoactive substance use, unspecified with psychoactive substance-induced mood disorder: Secondary | ICD-10-CM | POA: Diagnosis not present

## 2023-08-22 DIAGNOSIS — R58 Hemorrhage, not elsewhere classified: Secondary | ICD-10-CM | POA: Diagnosis not present

## 2023-08-22 DIAGNOSIS — R319 Hematuria, unspecified: Secondary | ICD-10-CM | POA: Diagnosis not present

## 2023-08-22 LAB — CBC WITH DIFFERENTIAL/PLATELET
Abs Immature Granulocytes: 0 10*3/uL (ref 0.00–0.07)
Basophils Absolute: 0 10*3/uL (ref 0.0–0.1)
Basophils Relative: 1 %
Eosinophils Absolute: 0.1 10*3/uL (ref 0.0–0.5)
Eosinophils Relative: 2 %
HCT: 44.2 % (ref 39.0–52.0)
Hemoglobin: 14.5 g/dL (ref 13.0–17.0)
Immature Granulocytes: 0 %
Lymphocytes Relative: 32 %
Lymphs Abs: 1.6 10*3/uL (ref 0.7–4.0)
MCH: 27.9 pg (ref 26.0–34.0)
MCHC: 32.8 g/dL (ref 30.0–36.0)
MCV: 85 fL (ref 80.0–100.0)
Monocytes Absolute: 0.5 10*3/uL (ref 0.1–1.0)
Monocytes Relative: 9 %
Neutro Abs: 2.7 10*3/uL (ref 1.7–7.7)
Neutrophils Relative %: 56 %
Platelets: 184 10*3/uL (ref 150–400)
RBC: 5.2 MIL/uL (ref 4.22–5.81)
RDW: 12.8 % (ref 11.5–15.5)
WBC: 4.8 10*3/uL (ref 4.0–10.5)
nRBC: 0 % (ref 0.0–0.2)

## 2023-08-22 LAB — URINALYSIS, ROUTINE W REFLEX MICROSCOPIC
Bilirubin Urine: NEGATIVE
Glucose, UA: >=500 mg/dL — AB
Hgb urine dipstick: NEGATIVE
Ketones, ur: NEGATIVE mg/dL
Leukocytes,Ua: NEGATIVE
Nitrite: NEGATIVE
Protein, ur: NEGATIVE mg/dL
Specific Gravity, Urine: 1.03 (ref 1.005–1.030)
pH: 6 (ref 5.0–8.0)

## 2023-08-22 LAB — CBG MONITORING, ED
Glucose-Capillary: 586 mg/dL (ref 70–99)
Glucose-Capillary: 311 mg/dL — ABNORMAL HIGH (ref 70–99)
Glucose-Capillary: 188 mg/dL — ABNORMAL HIGH (ref 70–99)

## 2023-08-22 LAB — COMPREHENSIVE METABOLIC PANEL
ALT: 15 U/L (ref 0–44)
AST: 20 U/L (ref 15–41)
Albumin: 4 g/dL (ref 3.5–5.0)
Alkaline Phosphatase: 61 U/L (ref 38–126)
Anion gap: 11 (ref 5–15)
BUN: 8 mg/dL (ref 6–20)
CO2: 25 mmol/L (ref 22–32)
Calcium: 8.7 mg/dL — ABNORMAL LOW (ref 8.9–10.3)
Chloride: 94 mmol/L — ABNORMAL LOW (ref 98–111)
Creatinine, Ser: 1.24 mg/dL (ref 0.61–1.24)
GFR, Estimated: >60 mL/min (ref >60)
Glucose, Bld: 578 mg/dL (ref 70–99)
Potassium: 3.8 mmol/L (ref 3.5–5.1)
Sodium: 130 mmol/L — ABNORMAL LOW (ref 135–145)
Total Bilirubin: 0.9 mg/dL (ref 0.0–1.2)
Total Protein: 7.5 g/dL (ref 6.5–8.1)

## 2023-08-22 LAB — BLOOD GAS, VENOUS
Acid-Base Excess: 1.2 mmol/L (ref 0.0–2.0)
Bicarbonate: 26.6 mmol/L (ref 20.0–28.0)
O2 Saturation: 98.7 %
Patient temperature: 37
pCO2, Ven: 44 mm[Hg] (ref 44–60)
pH, Ven: 7.39 (ref 7.25–7.43)
pO2, Ven: 80 mm[Hg] — ABNORMAL HIGH (ref 32–45)

## 2023-08-22 MED ORDER — INSULIN ASPART 100 UNIT/ML IJ SOLN
10.0000 [IU] | Freq: Once | INTRAMUSCULAR | Status: AC
  Administered 2023-08-22: 10 [IU] via INTRAVENOUS
  Filled 2023-08-22: qty 0.1

## 2023-08-22 MED ORDER — SODIUM CHLORIDE 0.9 % IV BOLUS
1000.0000 mL | Freq: Once | INTRAVENOUS | Status: AC
  Administered 2023-08-22: 1000 mL via INTRAVENOUS

## 2023-08-22 NOTE — ED Notes (Addendum)
CBG 311

## 2023-08-22 NOTE — Discharge Instructions (Signed)
Continue compliance with your prescribed medications. Follow up with your primary care doctor.

## 2023-08-22 NOTE — ED Triage Notes (Signed)
Pt [facility]. Pt c/o headache, dizziness,fatigue and dysuria. Pt cbg read high on meter. PT aox4 155/102 99% RA 88HR 16RR

## 2023-08-22 NOTE — ED Provider Notes (Signed)
[place] EMERGENCY DEPARTMENT AT [facility] Provider Note   CSN: [id number] Arrival date & time: 08/22/23  4098     History  Chief Complaint  Patient presents with   Hyperglycemia    [name] [name] is a 38 y.o. male.  38 y/o male presents to the ED for evaluation of hyperglycemia. States he has been feeling fatigued and lightheaded. Endorses compliance with his insulin despite multiple ED visits for hyperglycemia. Reports he is currently homeless.  Falls back to sleep easily when not stimulated, making more extensive history challenging.  The history is provided by the patient. No language interpreter was used.  Hyperglycemia      Home Medications Prior to Admission medications   Medication Sig Start Date End Date Taking? Authorizing Provider  albuterol (VENTOLIN HFA) 108 (90 Base) MCG/ACT inhaler Inhale 2 puffs into the lungs every 6 (six) hours as needed for wheezing or shortness of breath. 07/21/23   Pokhrel, Rebekah Chesterfield, MD  blood glucose meter kit and supplies KIT Use to check blood sugar three times daily 05/14/23   [name], Deno Lunger, MD  Glucose Blood (BLOOD GLUCOSE TEST STRIPS) STRP Use test strip to check blood sugar three times daily 07/05/23   Standley Brooking, MD  ibuprofen (ADVIL) 600 MG tablet Take 1 tablet (600 mg total) by mouth every 6 (six) hours as needed for fever or mild pain (pain score 1-3). 07/21/23   Pokhrel, Rebekah Chesterfield, MD  insulin aspart (NOVOLOG) 100 UNIT/ML FlexPen Inject 8 Units into the skin 3 (three) times daily with meals. Only take if eating a meal AND Blood Glucose (BG) is 80 or higher. 08/10/23 10/09/23  Pollyann Savoy, MD  insulin glargine-yfgn (SEMGLEE) 100 UNIT/ML Pen Inject 25 Units into the skin at bedtime. 08/10/23 10/09/23  Pollyann Savoy, MD  Insulin Pen Needle (PEN NEEDLES) 31G X 5 MM MISC Use  three times daily 07/05/23   Standley Brooking, MD  Lancet Device MISC 1 each by Does not apply route 3 (three) times daily. May  dispense any manufacturer covered by patient's insurance. 05/14/23   [name], Deno Lunger, MD  Lancets MISC Use to check blood sugar three times daily 07/05/23   Standley Brooking, MD  lisinopril (ZESTRIL) 20 MG tablet Take 1 tablet (20 mg total) by mouth daily. Patient not taking: Reported on 07/19/2023 05/14/23 07/03/23  Marinda Elk, MD      Allergies    Patient has no known allergies.    Review of Systems   Review of Systems  Unable to perform ROS: Other    Physical Exam Updated Vital Signs BP (!) 147/87   Pulse 91   Temp 98.2 F (36.8 C) (Oral)   Resp 16   SpO2 100%   Physical Exam Vitals and nursing note reviewed.  Constitutional:      General: He is not in acute distress.    Appearance: He is well-developed. He is not diaphoretic.     Comments: Sleeping, snoring. Will wake to loud voice and sternal rubbing, but only momentarily.  HENT:     Head: Normocephalic and atraumatic.  Eyes:     General: No scleral icterus.    Conjunctiva/sclera: Conjunctivae normal.  Cardiovascular:     Rate and Rhythm: Normal rate and regular rhythm.     Pulses: Normal pulses.  Pulmonary:     Effort: Pulmonary effort is normal. No respiratory distress.     Breath sounds: No stridor. No wheezing.  Comments: Respirations even and unlabored Musculoskeletal:        General: Normal range of motion.     Cervical back: Normal range of motion.  Skin:    General: Skin is warm and dry.     Coloration: Skin is not pale.     Findings: No erythema or rash.  Neurological:     Mental Status: He is oriented to person, place, and time.     Coordination: Coordination normal.  Psychiatric:        Behavior: Behavior normal.     ED Results / Procedures / Treatments   Labs (all labs ordered are listed, but only abnormal results are displayed) Labs Reviewed  COMPREHENSIVE METABOLIC PANEL - Abnormal; Notable for the following components:      Result Value   Sodium 130 (*)    Chloride 94  (*)    Glucose, Bld 578 (*)    Calcium 8.7 (*)    All other components within normal limits  BLOOD GAS, VENOUS - Abnormal; Notable for the following components:   pO2, Ven 80 (*)    All other components within normal limits  URINALYSIS, ROUTINE W REFLEX MICROSCOPIC - Abnormal; Notable for the following components:   Color, Urine STRAW (*)    Glucose, UA >=500 (*)    Bacteria, UA RARE (*)    All other components within normal limits  CBG MONITORING, ED - Abnormal; Notable for the following components:   Glucose-Capillary 586 (*)    All other components within normal limits  CBG MONITORING, ED - Abnormal; Notable for the following components:   Glucose-Capillary 311 (*)    All other components within normal limits  CBG MONITORING, ED - Abnormal; Notable for the following components:   Glucose-Capillary 188 (*)    All other components within normal limits  CBC WITH DIFFERENTIAL/PLATELET    EKG None  Radiology No results found.  Procedures Procedures    Medications Ordered in ED Medications  sodium chloride 0.9 % bolus 1,000 mL (0 mLs Intravenous Stopped 08/22/23 0520)  insulin aspart (novoLOG) injection 10 Units (10 Units Intravenous Given 08/22/23 0313)    ED Course/ Medical Decision Making/ A&P Clinical Course as of 08/22/23 0603  Fri Aug 22, 2023  0308 Negative ketonuria  [KH]  0407 Repeat CBG 311. [KH]  0408 Labs without concern for DKA. [KH]  0521 CBG improving. Stable for discharge. [KH]    Clinical Course User Index [KH] Antony Madura, PA-C                                 Medical Decision Making Amount and/or Complexity of Data Reviewed Labs: ordered.  Risk Prescription drug management.   This patient presents to the ED for concern of hyperglycemia, this involves an extensive number of treatment options, and is a complaint that carries with it a high risk of complications and morbidity.  The differential diagnosis includes DKA vs HHS vs  hyperglycemia   Co morbidities that complicate the patient evaluation  IDDM   Additional history obtained:  Additional history obtained from EMS personnel External records from outside source obtained and reviewed including current medications    Lab Tests:  I Ordered, and personally interpreted labs.  The pertinent results include:  CBG 578 > 311 > 188. No leukocytosis, elevated anion gap, or ketonuria.   Cardiac Monitoring:  The patient was maintained on a cardiac monitor.  I personally viewed  and interpreted the cardiac monitored which showed an underlying rhythm of: NSR   Medicines ordered and prescription drug management:  I ordered medication including IV insulin and IVF for hyperglycemia  Reevaluation of the patient after these medicines showed that the patient improved I have reviewed the patients home medicines and have made adjustments as needed   Test Considered:  Beta hydroxybutyrate    Problem List / ED Course:  As above   Reevaluation:  After the interventions noted above, I reevaluated the patient and found that they have :improved   Social Determinants of Health:  Homeless    Dispostion:  After consideration of the diagnostic results and the patients response to treatment, I feel that the patent would benefit from adherence to prescribed medications with PCP f/u. Return precautions discussed and provided. Patient discharged in stable condition with no unaddressed concerns.          Final Clinical Impression(s) / ED Diagnoses Final diagnoses:  Hyperglycemia    Rx / DC Orders ED Discharge Orders     None         Antony Madura, PA-C 08/22/23 0606    Nira Conn, MD 08/22/23 [phone]

## 2023-08-25 ENCOUNTER — Encounter (HOSPITAL_COMMUNITY): Payer: Self-pay | Admitting: Emergency Medicine

## 2023-08-25 ENCOUNTER — Observation Stay (HOSPITAL_COMMUNITY)
Admission: EM | Admit: 2023-08-25 | Discharge: 2023-08-26 | Disposition: A | Discharge status: Home / Self Care | Payer: No Typology Code available for payment source | Attending: Family Medicine | Admitting: Family Medicine

## 2023-08-25 ENCOUNTER — Other Ambulatory Visit: Payer: Self-pay

## 2023-08-25 DIAGNOSIS — F1994 Other psychoactive substance use, unspecified with psychoactive substance-induced mood disorder: Secondary | ICD-10-CM | POA: Diagnosis present

## 2023-08-25 DIAGNOSIS — Z794 Long term (current) use of insulin: Secondary | ICD-10-CM | POA: Insufficient documentation

## 2023-08-25 DIAGNOSIS — E1100 Type 2 diabetes mellitus with hyperosmolarity without nonketotic hyperglycemic-hyperosmolar coma (NKHHC): Secondary | ICD-10-CM | POA: Diagnosis not present

## 2023-08-25 DIAGNOSIS — R079 Chest pain, unspecified: Secondary | ICD-10-CM | POA: Diagnosis not present

## 2023-08-25 DIAGNOSIS — G9341 Metabolic encephalopathy: Secondary | ICD-10-CM | POA: Diagnosis present

## 2023-08-25 DIAGNOSIS — R739 Hyperglycemia, unspecified: Principal | ICD-10-CM

## 2023-08-25 DIAGNOSIS — Z743 Need for continuous supervision: Secondary | ICD-10-CM | POA: Diagnosis not present

## 2023-08-25 DIAGNOSIS — E1165 Type 2 diabetes mellitus with hyperglycemia: Secondary | ICD-10-CM | POA: Diagnosis not present

## 2023-08-25 DIAGNOSIS — I10 Essential (primary) hypertension: Secondary | ICD-10-CM | POA: Diagnosis not present

## 2023-08-25 DIAGNOSIS — Z79899 Other long term (current) drug therapy: Secondary | ICD-10-CM | POA: Insufficient documentation

## 2023-08-25 LAB — CBC WITH DIFFERENTIAL/PLATELET
Abs Immature Granulocytes: 0.01 10*3/uL (ref 0.00–0.07)
Basophils Absolute: 0 10*3/uL (ref 0.0–0.1)
Basophils Relative: 1 %
Eosinophils Absolute: 0.1 10*3/uL (ref 0.0–0.5)
Eosinophils Relative: 1 %
HCT: 41.6 % (ref 39.0–52.0)
Hemoglobin: 14.2 g/dL (ref 13.0–17.0)
Immature Granulocytes: 0 %
Lymphocytes Relative: 29 %
Lymphs Abs: 1.3 10*3/uL (ref 0.7–4.0)
MCH: 28.5 pg (ref 26.0–34.0)
MCHC: 34.1 g/dL (ref 30.0–36.0)
MCV: 83.5 fL (ref 80.0–100.0)
Monocytes Absolute: 0.4 10*3/uL (ref 0.1–1.0)
Monocytes Relative: 10 %
Neutro Abs: 2.5 10*3/uL (ref 1.7–7.7)
Neutrophils Relative %: 59 %
Platelets: 181 10*3/uL (ref 150–400)
RBC: 4.98 MIL/uL (ref 4.22–5.81)
RDW: 12.7 % (ref 11.5–15.5)
WBC: 4.3 10*3/uL (ref 4.0–10.5)
nRBC: 0 % (ref 0.0–0.2)

## 2023-08-25 LAB — URINALYSIS, ROUTINE W REFLEX MICROSCOPIC
Bacteria, UA: NONE SEEN
Bilirubin Urine: NEGATIVE
Glucose, UA: >=500 mg/dL — AB
Ketones, ur: NEGATIVE mg/dL
Leukocytes,Ua: NEGATIVE
Nitrite: NEGATIVE
Protein, ur: NEGATIVE mg/dL
Specific Gravity, Urine: 1.026 (ref 1.005–1.030)
pH: 7 (ref 5.0–8.0)

## 2023-08-25 LAB — COMPREHENSIVE METABOLIC PANEL
ALT: 13 U/L (ref 0–44)
AST: 15 U/L (ref 15–41)
Albumin: 3.8 g/dL (ref 3.5–5.0)
Alkaline Phosphatase: 76 U/L (ref 38–126)
Anion gap: 9 (ref 5–15)
BUN: 10 mg/dL (ref 6–20)
CO2: 26 mmol/L (ref 22–32)
Calcium: 9.2 mg/dL (ref 8.9–10.3)
Chloride: 92 mmol/L — ABNORMAL LOW (ref 98–111)
Creatinine, Ser: 1.07 mg/dL (ref 0.61–1.24)
GFR, Estimated: >60 mL/min (ref >60)
Glucose, Bld: 810 mg/dL (ref 70–99)
Potassium: 4 mmol/L (ref 3.5–5.1)
Sodium: 127 mmol/L — ABNORMAL LOW (ref 135–145)
Total Bilirubin: 1 mg/dL (ref 0.0–1.2)
Total Protein: 7 g/dL (ref 6.5–8.1)

## 2023-08-25 LAB — BLOOD GAS, VENOUS
Acid-Base Excess: 6.9 mmol/L — ABNORMAL HIGH (ref 0.0–2.0)
Bicarbonate: 33 mmol/L — ABNORMAL HIGH (ref 20.0–28.0)
O2 Saturation: 93.9 %
Patient temperature: 37
pCO2, Ven: 52 mm[Hg] (ref 44–60)
pH, Ven: 7.41 (ref 7.25–7.43)
pO2, Ven: 65 mm[Hg] — ABNORMAL HIGH (ref 32–45)

## 2023-08-25 LAB — CBG MONITORING, ED
Glucose-Capillary: >600 mg/dL (ref 70–99)
Glucose-Capillary: >600 mg/dL (ref 70–99)
Glucose-Capillary: >600 mg/dL (ref 70–99)

## 2023-08-25 LAB — BETA-HYDROXYBUTYRIC ACID: Beta-Hydroxybutyric Acid: 0.13 mmol/L (ref 0.05–0.27)

## 2023-08-25 MED ORDER — LACTATED RINGERS IV BOLUS
20.0000 mL/kg | Freq: Once | INTRAVENOUS | Status: AC
  Administered 2023-08-26: 1888 mL via INTRAVENOUS

## 2023-08-25 MED ORDER — DEXTROSE IN LACTATED RINGERS 5 % IV SOLN: INTRAVENOUS | Status: DC

## 2023-08-25 MED ORDER — LACTATED RINGERS IV BOLUS
2000.0000 mL | Freq: Once | INTRAVENOUS | Status: AC
  Administered 2023-08-25: 2000 mL via INTRAVENOUS

## 2023-08-25 MED ORDER — LACTATED RINGERS IV SOLN: INTRAVENOUS | Status: DC

## 2023-08-25 MED ORDER — DEXTROSE 50 % IV SOLN: 0.0000 mL | INTRAVENOUS | Status: DC | PRN

## 2023-08-25 MED ORDER — POTASSIUM CHLORIDE 10 MEQ/100ML IV SOLN
10.0000 meq | INTRAVENOUS | Status: AC
  Administered 2023-08-25 – 2023-08-26 (×2): 10 meq via INTRAVENOUS
  Filled 2023-08-25 (×2): qty 100

## 2023-08-25 MED ORDER — LACTATED RINGERS IV BOLUS: 20.0000 mL/kg | Freq: Once | INTRAVENOUS | Status: DC

## 2023-08-25 MED ORDER — INSULIN REGULAR(HUMAN) IN NACL 100-0.9 UT/100ML-% IV SOLN
INTRAVENOUS | Status: DC
Start: 2023-08-25 — End: 2023-08-26
  Administered 2023-08-25: 13 [IU]/h via INTRAVENOUS
  Filled 2023-08-25: qty 100

## 2023-08-25 NOTE — ED Notes (Signed)
Pt blood sugar is very high its over 600

## 2023-08-25 NOTE — H&P (Signed)
History and Physical    Patient: [name] [name]:[id number] DOB: 1986/01/25 DOA: 08/25/2023 DOS: the patient was seen and examined on 08/25/2023 PCP: [name], NP  Patient coming from: Home  Chief Complaint:  Chief Complaint  Patient presents with   Hyperglycemia   HPI: [name] [name] is a 38 y.o. male with medical history significant of insulin-dependent type 2 diabetes, essential hypertension, hyperlipidemia, substance use disorder who has apparently not been compliant with his insulin trying to space it out.  Patient came to the ER with altered mental status, nausea.  Patient is apparently homeless at the moment.  He has been in the ER several including on Friday.  On arrival his blood sugar was found to be over 800.  He is not acidotic.  No evidence of DKA at the moment.  Patient however appears to have hyperosmolar hyperglycemia and will be admitted to the hospital for evaluation and treatment.  He is currently altered not able to give me any history.  Patient was apparently awake when he arrived and was found eating candy bar.  Review of Systems: As mentioned in the history of present illness. All other systems reviewed and are negative. Past Medical History:  Diagnosis Date   Diabetes mellitus without complication (HCC)    DKA (diabetic ketoacidosis) (HCC) 05/08/2022   HTN (hypertension) 05/08/2022   Hyperlipidemia    Hyperosmolar hyperglycemic state (HHS) (HCC) 05/08/2022   Hypertension    Hypoglycemia 07/02/2019   Pseudohyponatremia 05/08/2022   Past Surgical History:  Procedure Laterality Date   HAND SURGERY     Social History:  reports that he has been smoking cigarettes. He has never used smokeless tobacco. He reports current drug use. Drugs: Amphetamines and Marijuana. He reports that he does not drink alcohol.  No Known Allergies  Family History  Family history unknown: Yes    Prior to Admission medications   Medication Sig Start Date End Date Taking?  Authorizing Provider  HUMULIN R 100 UNIT/ML injection  08/11/23  Yes [provider]  albuterol (VENTOLIN HFA) 108 (90 Base) MCG/ACT inhaler Inhale 2 puffs into the lungs every 6 (six) hours as needed for wheezing or shortness of breath. 07/21/23   Pokhrel, Rebekah Chesterfield, MD  blood glucose meter kit and supplies KIT Use to check blood sugar three times daily 05/14/23   [name], Deno Lunger, MD  Glucose Blood (BLOOD GLUCOSE TEST STRIPS) STRP Use test strip to check blood sugar three times daily 07/05/23   Standley Brooking, MD  ibuprofen (ADVIL) 600 MG tablet Take 1 tablet (600 mg total) by mouth every 6 (six) hours as needed for fever or mild pain (pain score 1-3). 07/21/23   Pokhrel, Rebekah Chesterfield, MD  insulin aspart (NOVOLOG) 100 UNIT/ML FlexPen Inject 8 Units into the skin 3 (three) times daily with meals. Only take if eating a meal AND Blood Glucose (BG) is 80 or higher. 08/10/23 10/09/23  Pollyann Savoy, MD  insulin glargine-yfgn (SEMGLEE) 100 UNIT/ML Pen Inject 25 Units into the skin at bedtime. 08/10/23 10/09/23  Pollyann Savoy, MD  Insulin Pen Needle (PEN NEEDLES) 31G X 5 MM MISC Use  three times daily 07/05/23   Standley Brooking, MD  Lancet Device MISC 1 each by Does not apply route 3 (three) times daily. May dispense any manufacturer covered by patient's insurance. 05/14/23   [name], Deno Lunger, MD  Lancets MISC Use to check blood sugar three times daily 07/05/23   Standley Brooking, MD  lisinopril (  ZESTRIL) 20 MG tablet Take 1 tablet (20 mg total) by mouth daily. Patient not taking: Reported on 07/19/2023 05/14/23 07/03/23  Marinda Elk, MD    Physical Exam: Vitals:   08/25/23 2045 08/25/23 2050  BP:  (!) 147/101  Pulse:  84  Resp:  16  Temp:  (!) 97.3 F (36.3 C)  TempSrc:  Oral  SpO2:  98%  Height: 5\' 11"  (1.803 m)    Constitutional: Altered, unresponsive NAD, calm, comfortable Eyes: PERRL, lids and conjunctivae normal ENMT: Mucous membranes are dry. Posterior pharynx  clear of any exudate or lesions.Normal dentition.  Neck: normal, supple, no masses, no thyromegaly Respiratory: clear to auscultation bilaterally, no wheezing, no crackles. Normal respiratory effort. No accessory muscle use.  Cardiovascular: Regular rate and rhythm, no murmurs / rubs / gallops. No extremity edema. 2+ pedal pulses. No carotid bruits.  Abdomen: no tenderness, no masses palpated. No hepatosplenomegaly. Bowel sounds positive.  Musculoskeletal: Good range of motion, no joint swelling or tenderness, Skin: no rashes, lesions, ulcers. No induration Neurologic: CN 2-12 grossly intact. Sensation intact, DTR normal. Strength 5/5 in all 4.  Psychiatric: Drowsy, arousable, not carrying out conversation  Data Reviewed:  Blood pressure 147/101, temperature 97.3, pH is 7.4, sodium 127, chloride 92 CO2 26 glucose 810 anion gap of 9 the rest of the CBC is within normal urinalysis showed glucosuria only.  Assessment and Plan:  #1 hyperosmolar hyperglycemia: Patient will be admitted.  Initiated on insulin drip protocol.  Not in DKA.  Aggressive hydration.  Follow protocol.  Admitted to stepdown unit.  Gradually transition to subcutaneous insulin.  Will need case management at the end of the day to help with obtaining his insulin at home.  #2 essential hypertension: Patient has been on lisinopril.  Will resume that when stable.  #3 altered mental status: Patient appears to have acute metabolic encephalopathy probably from his hyperglycemia.  We will continue control of his blood sugar.  Hopefully this will resolve his problem.  #4 dehydration: Aggressive hydration    Advance Care Planning:   Code Status: Prior full code  Consults: None  Family Communication: No family at bedside  Severity of Illness: The appropriate patient status for this patient is INPATIENT. Inpatient status is judged to be reasonable and necessary in order to provide the required intensity of service to ensure the  patient's safety. The patient's presenting symptoms, physical exam findings, and initial radiographic and laboratory data in the context of their chronic comorbidities is felt to place them at high risk for further clinical deterioration. Furthermore, it is not anticipated that the patient will be medically stable for discharge from the hospital within 2 midnights of admission.   * I certify that at the point of admission it is my clinical judgment that the patient will require inpatient hospital care spanning beyond 2 midnights from the point of admission due to high intensity of service, high risk for further deterioration and high frequency of surveillance required.*  AuthorLonia Blood, MD 08/25/2023 11:36 PM  For on call review [email or web address].

## 2023-08-25 NOTE — ED Triage Notes (Signed)
Patient BIB EMS c/o hyperglycemia. Patient report he not been taking his medicine. Patient homeless.  BP 164/96 HR80 RR 20 O2sat 95% on RA

## 2023-08-26 ENCOUNTER — Emergency Department (HOSPITAL_COMMUNITY): Payer: No Typology Code available for payment source

## 2023-08-26 ENCOUNTER — Other Ambulatory Visit (HOSPITAL_COMMUNITY): Payer: Self-pay

## 2023-08-26 ENCOUNTER — Emergency Department (HOSPITAL_COMMUNITY)
Admission: EM | Admit: 2023-08-26 | Discharge: 2023-08-27 | Disposition: A | Discharge status: Home / Self Care | Payer: No Typology Code available for payment source | Attending: Emergency Medicine | Admitting: Emergency Medicine

## 2023-08-26 DIAGNOSIS — I10 Essential (primary) hypertension: Secondary | ICD-10-CM | POA: Diagnosis not present

## 2023-08-26 DIAGNOSIS — R0789 Other chest pain: Secondary | ICD-10-CM | POA: Diagnosis not present

## 2023-08-26 DIAGNOSIS — R079 Chest pain, unspecified: Secondary | ICD-10-CM | POA: Diagnosis not present

## 2023-08-26 DIAGNOSIS — Z743 Need for continuous supervision: Secondary | ICD-10-CM | POA: Diagnosis not present

## 2023-08-26 DIAGNOSIS — Z5321 Procedure and treatment not carried out due to patient leaving prior to being seen by health care provider: Secondary | ICD-10-CM | POA: Insufficient documentation

## 2023-08-26 DIAGNOSIS — E1165 Type 2 diabetes mellitus with hyperglycemia: Secondary | ICD-10-CM | POA: Diagnosis not present

## 2023-08-26 DIAGNOSIS — E1100 Type 2 diabetes mellitus with hyperosmolarity without nonketotic hyperglycemic-hyperosmolar coma (NKHHC): Secondary | ICD-10-CM | POA: Diagnosis not present

## 2023-08-26 LAB — BASIC METABOLIC PANEL
Anion gap: 11 (ref 5–15)
Anion gap: 8 (ref 5–15)
BUN: 9 mg/dL (ref 6–20)
BUN: 8 mg/dL (ref 6–20)
CO2: 23 mmol/L (ref 22–32)
CO2: 23 mmol/L (ref 22–32)
Calcium: 9.2 mg/dL (ref 8.9–10.3)
Calcium: 8.4 mg/dL — ABNORMAL LOW (ref 8.9–10.3)
Chloride: 101 mmol/L (ref 98–111)
Chloride: 101 mmol/L (ref 98–111)
Creatinine, Ser: 0.94 mg/dL (ref 0.61–1.24)
Creatinine, Ser: 0.82 mg/dL (ref 0.61–1.24)
GFR, Estimated: >60 mL/min (ref >60)
GFR, Estimated: >60 mL/min (ref >60)
Glucose, Bld: 214 mg/dL — ABNORMAL HIGH (ref 70–99)
Glucose, Bld: 189 mg/dL — ABNORMAL HIGH (ref 70–99)
Potassium: 3.6 mmol/L (ref 3.5–5.1)
Potassium: 3.5 mmol/L (ref 3.5–5.1)
Sodium: 135 mmol/L (ref 135–145)
Sodium: 132 mmol/L — ABNORMAL LOW (ref 135–145)

## 2023-08-26 LAB — CBG MONITORING, ED
Glucose-Capillary: 107 mg/dL — ABNORMAL HIGH (ref 70–99)
Glucose-Capillary: 130 mg/dL — ABNORMAL HIGH (ref 70–99)
Glucose-Capillary: 178 mg/dL — ABNORMAL HIGH (ref 70–99)
Glucose-Capillary: 191 mg/dL — ABNORMAL HIGH (ref 70–99)
Glucose-Capillary: 205 mg/dL — ABNORMAL HIGH (ref 70–99)
Glucose-Capillary: 302 mg/dL — ABNORMAL HIGH (ref 70–99)
Glucose-Capillary: 362 mg/dL — ABNORMAL HIGH (ref 70–99)

## 2023-08-26 LAB — CBC
HCT: 43.4 % (ref 39.0–52.0)
Hemoglobin: 14.9 g/dL (ref 13.0–17.0)
MCH: 28.5 pg (ref 26.0–34.0)
MCHC: 34.3 g/dL (ref 30.0–36.0)
MCV: 83.1 fL (ref 80.0–100.0)
Platelets: 186 10*3/uL (ref 150–400)
RBC: 5.22 MIL/uL (ref 4.22–5.81)
RDW: 12.8 % (ref 11.5–15.5)
WBC: 5 10*3/uL (ref 4.0–10.5)
nRBC: 0 % (ref 0.0–0.2)

## 2023-08-26 LAB — OSMOLALITY: Osmolality: 303 mosm/kg — ABNORMAL HIGH (ref 275–295)

## 2023-08-26 LAB — BETA-HYDROXYBUTYRIC ACID: Beta-Hydroxybutyric Acid: 0.42 mmol/L — ABNORMAL HIGH (ref 0.05–0.27)

## 2023-08-26 MED ORDER — LACTATED RINGERS IV SOLN: INTRAVENOUS | Status: DC

## 2023-08-26 MED ORDER — ENOXAPARIN SODIUM 40 MG/0.4ML IJ SOSY
40.0000 mg | PREFILLED_SYRINGE | INTRAMUSCULAR | Status: DC
  Administered 2023-08-26: 40 mg via SUBCUTANEOUS
  Filled 2023-08-26: qty 0.4

## 2023-08-26 MED ORDER — BLOOD GLUCOSE MONITOR KIT
1.0000 | PACK | Freq: Three times a day (TID) | 0 refills | Status: DC
  Filled 2023-08-26: qty 1, 30d supply, fill #0

## 2023-08-26 MED ORDER — BLOOD GLUCOSE TEST VI STRP
1.0000 | ORAL_STRIP | Freq: Three times a day (TID) | 0 refills | Status: DC
  Filled 2023-08-26: qty 100, 30d supply, fill #0

## 2023-08-26 MED ORDER — INSULIN ASPART 100 UNIT/ML IJ SOLN
0.0000 [IU] | Freq: Three times a day (TID) | INTRAMUSCULAR | Status: DC
  Administered 2023-08-26: 5 [IU] via SUBCUTANEOUS
  Filled 2023-08-26: qty 0.15

## 2023-08-26 MED ORDER — LANCETS MISC
1.0000 | Freq: Three times a day (TID) | 2 refills | Status: DC
  Filled 2023-08-26: qty 100, 30d supply, fill #0

## 2023-08-26 MED ORDER — INSULIN LISPRO (1 UNIT DIAL) 100 UNIT/ML (KWIKPEN)
8.0000 [IU] | PEN_INJECTOR | Freq: Three times a day (TID) | SUBCUTANEOUS | 11 refills | Status: DC
  Filled 2023-08-26: qty 15, 63d supply, fill #0

## 2023-08-26 MED ORDER — INSULIN REGULAR(HUMAN) IN NACL 100-0.9 UT/100ML-% IV SOLN: INTRAVENOUS | Status: DC

## 2023-08-26 MED ORDER — SODIUM CHLORIDE 0.9% FLUSH
3.0000 mL | Freq: Two times a day (BID) | INTRAVENOUS | Status: DC
  Administered 2023-08-26 (×2): 3 mL via INTRAVENOUS

## 2023-08-26 MED ORDER — DEXTROSE IN LACTATED RINGERS 5 % IV SOLN: INTRAVENOUS | Status: DC

## 2023-08-26 MED ORDER — INSULIN GLARGINE-YFGN 100 UNIT/ML ~~LOC~~ SOPN
25.0000 [IU] | PEN_INJECTOR | Freq: Every day | SUBCUTANEOUS | 11 refills | Status: DC
  Filled 2023-08-26: qty 9, 36d supply, fill #0

## 2023-08-26 MED ORDER — INSULIN ASPART 100 UNIT/ML IJ SOLN
0.0000 [IU] | Freq: Every day | INTRAMUSCULAR | Status: DC
  Filled 2023-08-26: qty 0.05

## 2023-08-26 MED ORDER — SODIUM CHLORIDE 0.9% FLUSH: 3.0000 mL | INTRAVENOUS | Status: DC | PRN

## 2023-08-26 MED ORDER — POTASSIUM CHLORIDE 10 MEQ/100ML IV SOLN
10.0000 meq | INTRAVENOUS | Status: AC
  Administered 2023-08-26 (×2): 10 meq via INTRAVENOUS
  Filled 2023-08-26 (×2): qty 100

## 2023-08-26 MED ORDER — LACTATED RINGERS IV BOLUS: 20.0000 mL/kg | Freq: Once | INTRAVENOUS | Status: DC

## 2023-08-26 MED ORDER — SODIUM CHLORIDE 0.9 % IV SOLN: 250.0000 mL | INTRAVENOUS | Status: DC | PRN

## 2023-08-26 MED ORDER — PEN NEEDLES 31G X 5 MM MISC
1.0000 | Freq: Three times a day (TID) | 2 refills | Status: DC
  Filled 2023-08-26: qty 100, 30d supply, fill #0

## 2023-08-26 MED ORDER — LANCET DEVICE MISC
1.0000 | Freq: Three times a day (TID) | 0 refills | Status: DC
  Filled 2023-08-26: qty 1, fill #0

## 2023-08-26 NOTE — ED Notes (Signed)
Pt refused to stay connected for vitals

## 2023-08-26 NOTE — Discharge Summary (Signed)
Physician Discharge Summary   Patient: [name] [name] MRN: [id number] DOB: 02-Sep-1985  Admit date:     08/25/2023  Discharge date: 08/26/23  Discharge Physician: [name] [name]   PCP: [name] Sharps, NP     Recommendations at discharge:  Follow-up with PCP within 1 week for diabetes     Discharge Diagnoses: Principal Problem:   Type 2 diabetes mellitus with hyperosmolar hyperglycemic state (HHS) (HCC) Active Problems:   Acute metabolic encephalopathy   Substance induced mood disorder [facility])       Hospital Course: [name] with uncontrolled DM, HTN, substance abuse, and substance induced mood disorder who presented with sluggishness and weakness.  The patient is homeless, and does not have anywhere to get insulin, or store it.  In the ER his glucose was greater than 800 and he was not acidotic.    [facility] The patient was admitted and started on insulin drip.  His glucoses were rapidly corrected, he was transitioned to subcutaneous insulin and his sugars were easily controlled.  He ambulated and tolerated an oral diet, was evaluated by the diabetes education team, and social work for assistance with medications.  He was provided with refills of all his medications, new lancets, test strips, glucometer, and problem solving regarding storage of his insulin was done to the best of our abilities.  Substance use was discouraged.          The [facility] Controlled Substances Registry was reviewed for this 38 year old patient prior to discharge.  Consultants: Diabetes education, social work Procedures performed: None Disposition: Home Diet recommendation:  Carb modified diet  DISCHARGE MEDICATION: Allergies as of 08/26/2023   No Known Allergies      Medication List     STOP taking these medications    HumuLIN R 100 UNIT/ML injection Generic drug: insulin regular   ibuprofen 600 MG tablet Commonly known as: ADVIL   insulin aspart 100 UNIT/ML  FlexPen Commonly known as: NOVOLOG Replaced by: insulin lispro 100 UNIT/ML KwikPen       TAKE these medications    Accu-Chek Guide Test test strip Generic drug: glucose blood Use test strip to check blood sugar three times daily   Accu-Chek Guide w/Device Kit Use to check blood sugar three times daily   Accu-Chek Softclix Lancets lancets Use to check blood sugar three times daily   albuterol 108 (90 Base) MCG/ACT inhaler Commonly known as: VENTOLIN HFA Inhale 2 puffs into the lungs every 6 (six) hours as needed for wheezing or shortness of breath.   B-D UF III MINI PEN NEEDLES 31G X 5 MM Misc Generic drug: Insulin Pen Needle Use  three times daily   insulin glargine-yfgn 100 UNIT/ML Pen Commonly known as: SEMGLEE Inject 25 Units into the skin at bedtime.   insulin lispro 100 UNIT/ML KwikPen Commonly known as: HUMALOG Inject 8 Units into the skin 3 (three) times daily with meals. Only take if eating a meal AND Blood Glucose (BG) is 80 or higher. Replaces: insulin aspart 100 UNIT/ML FlexPen   lisinopril 20 MG tablet Commonly known as: ZESTRIL Take 1 tablet (20 mg total) by mouth daily.   OneTouch Delica Plus Lancing Misc 1 each by Does not apply route 3 (three) times daily. May dispense any manufacturer covered by patient's insurance.        Follow-up Information     [name], [name] Abrahams, NP. Schedule an appointment as soon as possible for a visit in 1 week(s).   Contact information: [address]  [address] [place] [address] [phone]                   Discharge Exam: [name] Weights   08/25/23 2357  Weight: 94.4 kg    General: Pt is alert, awake, not in acute distress Cardiovascular: RRR, nl S1-S2, no murmurs appreciated.   No LE edema.   Respiratory: Normal respiratory rate and rhythm.  CTAB without rales or wheezes. Abdominal: Abdomen soft and non-tender.  No distension or HSM.   Neuro/Psych: Strength symmetric in upper and lower extremities.   Judgment and insight appear normal.   Condition at discharge: fair  The results of significant diagnostics from this hospitalization (including imaging, microbiology, ancillary and laboratory) are listed below for reference.   Imaging Studies: DG Chest Portable 1 View Result Date: 07/30/2023 CLINICAL DATA:  Chest pain. EXAM: PORTABLE CHEST 1 VIEW COMPARISON:  Multiple priors, most recently 07/25/2023 FINDINGS: The cardiomediastinal contours are normal. The lungs are clear. Pulmonary vasculature is normal. No consolidation, pleural effusion, or pneumothorax. No acute osseous abnormalities are seen. IMPRESSION: No active disease. Electronically Signed   By: Narda Rutherford M.D.   On: 07/30/2023 21:51    Microbiology: Results for orders placed or performed during the hospital encounter of 07/30/23  Resp panel by RT-PCR (RSV, Flu A&B, Covid) Anterior Nasal Swab     Status: None   Collection Time: 07/30/23  9:15 PM   Specimen: Anterior Nasal Swab  Result Value Ref Range Status   SARS Coronavirus 2 by RT PCR NEGATIVE NEGATIVE Final    Comment: (NOTE) SARS-CoV-2 target nucleic acids are NOT DETECTED.  The SARS-CoV-2 RNA is generally detectable in upper respiratory specimens during the acute phase of infection. The lowest concentration of SARS-CoV-2 viral copies this assay can detect is 138 copies/mL. A negative result does not preclude SARS-Cov-2 infection and should not be used as the sole basis for treatment or other patient management decisions. A negative result may occur with  improper specimen collection/handling, submission of specimen other than nasopharyngeal swab, presence of viral mutation(s) within the areas targeted by this assay, and inadequate number of viral copies(<138 copies/mL). A negative result must be combined with clinical observations, patient history, and epidemiological information. The expected result is Negative.  Fact Sheet for Patients:   BloggerCourse.com  Fact Sheet for Healthcare Providers:  SeriousBroker.it  This test is no t yet approved or cleared by the [place] FDA and  has been authorized for detection and/or diagnosis of SARS-CoV-2 by FDA under an Emergency Use Authorization (EUA). This EUA will remain  in effect (meaning this test can be used) for the duration of the COVID-19 declaration under Section 564(b)(1) of the Act, 21 U.S.C.section 360bbb-3(b)(1), unless the authorization is terminated  or revoked sooner.       Influenza A by PCR NEGATIVE NEGATIVE Final   Influenza B by PCR NEGATIVE NEGATIVE Final    Comment: (NOTE) The Xpert Xpress SARS-CoV-2/FLU/RSV plus assay is intended as an aid in the diagnosis of influenza from Nasopharyngeal swab specimens and should not be used as a sole basis for treatment. Nasal washings and aspirates are unacceptable for Xpert Xpress SARS-CoV-2/FLU/RSV testing.  Fact Sheet for Patients: BloggerCourse.com  Fact Sheet for Healthcare Providers: SeriousBroker.it  This test is not yet approved or cleared by the [place] FDA and has been authorized for detection and/or diagnosis of SARS-CoV-2 by FDA under an Emergency Use Authorization (EUA). This EUA will remain in effect (meaning this test can be used) for  the duration of the COVID-19 declaration under Section 564(b)(1) of the Act, 21 U.S.C. section 360bbb-3(b)(1), unless the authorization is terminated or revoked.     Resp Syncytial Virus by PCR NEGATIVE NEGATIVE Final    Comment: (NOTE) Fact Sheet for Patients: BloggerCourse.com  Fact Sheet for Healthcare Providers: SeriousBroker.it  This test is not yet approved or cleared by the [place] FDA and has been authorized for detection and/or diagnosis of SARS-CoV-2 by FDA under an Emergency Use  Authorization (EUA). This EUA will remain in effect (meaning this test can be used) for the duration of the COVID-19 declaration under Section 564(b)(1) of the Act, 21 U.S.C. section 360bbb-3(b)(1), unless the authorization is terminated or revoked.  Performed at [facility], [address] [place], [place] [address]     Labs: CBC: Recent Labs  Lab 08/22/23 0301 08/25/23 2106 08/26/23 0046  WBC 4.8 4.3 5.0  NEUTROABS 2.7 2.5  --   HGB 14.5 14.2 14.9  HCT 44.2 41.6 43.4  MCV 85.0 83.5 83.1  PLT 184 181 186   Basic Metabolic Panel: Recent Labs  Lab 08/22/23 0301 08/25/23 2106 08/26/23 0046 08/26/23 0348  NA 130* 127* 135 132*  K 3.8 4.0 3.6 3.5  CL 94* 92* 101 101  CO2 25 26 23 23   GLUCOSE 578* 810* 214* 189*  BUN 8 10 9 8   CREATININE 1.24 1.07 0.94 0.82  CALCIUM 8.7* 9.2 9.2 8.4*   Liver Function Tests: Recent Labs  Lab 08/22/23 0301 08/25/23 2106  AST 20 15  ALT 15 13  ALKPHOS 61 76  BILITOT 0.9 1.0  PROT 7.5 7.0  ALBUMIN 4.0 3.8   CBG: Recent Labs  Lab 08/26/23 0225 08/26/23 0356 08/26/23 0756 08/26/23 0943 08/26/23 1151  GLUCAP 107* 130* 205* 178* 302*    Discharge time spent: approximately 45 minutes spent on discharge counseling, evaluation of patient on day of discharge, and coordination of discharge planning with nursing, social work, pharmacy and case management  Signed: Alberteen Sam, MD [facility] 08/26/2023

## 2023-08-26 NOTE — Inpatient Diabetes Management (Signed)
Inpatient Diabetes Program Recommendations  AACE/ADA: New Consensus Statement on Inpatient Glycemic Control (2015)  Target Ranges:  Prepandial:   less than 140 mg/dL      Peak postprandial:   less than 180 mg/dL (1-2 hours)      Critically ill patients:  140 - 180 mg/dL   Lab Results  Component Value Date   GLUCAP 178 (H) 08/26/2023   HGBA1C 12.4 (H) 04/20/2023    Discharge Recommendations: Long acting recommendations: Insulin Glargine (LANTUS) Solostar Pen 25 units QD  Short acting recommendations:  Meal coverage ONLY Insulin aspart (NOVOLOG) FlexPen  8 units TID   Supply/Referral recommendations: Glucometer Test strips Lancet device Lancets Pen needles - standard   Use Adult Diabetes Insulin Treatment Post Discharge order set.  Review of Glycemic Control   Latest Reference Range & Units 08/26/23 00:13 08/26/23 01:19 08/26/23 02:25 08/26/23 03:56 08/26/23 07:56 08/26/23 09:43  Glucose-Capillary 70 - 99 mg/dL 409 (H) 811 (H) 914 (H) 130 (H) 205 (H) 178 (H)  (H): Data is abnormally high   Diabetes history: DM2 Outpatient Diabetes medications: Semglee 25 units every day, Novolog 8 units TID Current orders for Inpatient glycemic control: Semglee 5 units x 1, Novolog 0-15 units TID and 0-5 units QHS   Met with patient at bedside.  He is very difficult to arouse and have a meaningful conversation; keeps falling asleep.  He was incarcerated and released last June.  Since then he has not had any insulin.  He is homeless.  Confirmed he is supposed to be taking the above home insulins.  Asked for benefit check on which insulins are covered by his insurance.  He does not have a glucometer therefore does not check his blood sugar.  When asked how he will keep his insulins, he states he will keep them in his book bag.  Encouraged him to administer insulins as prescribed.  He does not have episodes of hypoglycemia but is aware of S&S and how to treat.   Will continue to follow while  inpatient.  Thank you, Dulce Sellar, MSN, CDCES Diabetes Coordinator Inpatient Diabetes Program [phone] (team pager from 8a-5p)

## 2023-08-26 NOTE — ED Triage Notes (Signed)
[place] [facility] [place] [facility]. Per report, C/o central chest wall pain for 3 hours. Pain worse with palpation and ROM. CBG 217, hx of HTN, non compliant with his medications. 172 palpated, hr 100, 96% ra. Pt slept during transport, a/o x 4 and ambulatory.

## 2023-08-26 NOTE — ED Provider Notes (Signed)
[place] EMERGENCY DEPARTMENT AT [facility] Provider Note  CSN: [id number] Arrival date & time: 08/25/23 2030  Chief Complaint(s) Hyperglycemia  HPI RODRIGO MCGRANAHAN is a 38 y.o. male with PMH T1DM, HTN, HLD, multiple hospitalizations for hypoglycemia who presents emergency department for evaluation of hypoglycemia.  Patient states that he has been stretching his insulin at home and has since run out.  Endorses polyuria, polydipsia nausea and vomiting.  Denies chest pain, shortness of breath, headache, fever or other systemic symptoms.  Patient arrives with blood glucose greater than 600.   Past Medical History Past Medical History:  Diagnosis Date   Diabetes mellitus without complication (HCC)    DKA (diabetic ketoacidosis) (HCC) 05/08/2022   HTN (hypertension) 05/08/2022   Hyperlipidemia    Hyperosmolar hyperglycemic state (HHS) (HCC) 05/08/2022   Hypertension    Hypoglycemia 07/02/2019   Pseudohyponatremia 05/08/2022   Patient Active Problem List   Diagnosis Date Noted   Type 2 diabetes mellitus with hyperosmolar hyperglycemic state (HHS) (HCC) 08/25/2023   Sepsis (HCC) 07/20/2023   Influenza B 07/19/2023   Current episode of major depressive disorder without prior episode 07/14/2023   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   Hyperkalemia 07/04/2023   Hypoglycemia associated with type 2 diabetes mellitus (HCC) 05/07/2023   Hyperglycemia 04/20/2023   Acute metabolic encephalopathy 04/20/2023   Abdominal pain 01/01/2023   Tobacco abuse 01/01/2023   MSSA bacteremia 05/19/2022   Hyperosmolar hyperglycemic state (HHS) (HCC) 05/16/2022   HTN (hypertension) 05/08/2022   AKI (acute kidney injury) (HCC) 05/08/2022   Pseudohyponatremia 05/08/2022   DM2 (diabetes mellitus, type 2) (HCC) 05/08/2022   Substance induced mood disorder (HCC) 04/06/2022   Home Medication(s) Prior to Admission medications   Medication Sig Start Date End Date Taking? Authorizing Provider   albuterol (VENTOLIN HFA) 108 (90 Base) MCG/ACT inhaler Inhale 2 puffs into the lungs every 6 (six) hours as needed for wheezing or shortness of breath. 07/21/23  Yes Pokhrel, Laxman, MD  insulin glargine-yfgn (SEMGLEE) 100 UNIT/ML Pen Inject 25 Units into the skin at bedtime. 08/26/23 08/25/24 Yes Danford, Earl Lites, MD  insulin lispro (HUMALOG) 100 UNIT/ML KwikPen Inject 8 Units into the skin 3 (three) times daily with meals. Only take if eating a meal AND Blood Glucose (BG) is 80 or higher. 08/26/23 10/28/23 Yes Danford, Earl Lites, MD  blood glucose meter kit and supplies KIT Use to check blood sugar three times daily 08/26/23   Alberteen Sam, MD  Glucose Blood (BLOOD GLUCOSE TEST STRIPS) STRP Use test strip to check blood sugar three times daily 08/26/23   Danford, Earl Lites, MD  Insulin Pen Needle (PEN NEEDLES) 31G X 5 MM MISC Use  three times daily 08/26/23   Danford, Earl Lites, MD  Lancet Device MISC 1 each by Does not apply route 3 (three) times daily. May dispense any manufacturer covered by patient's insurance. 08/26/23   Alberteen Sam, MD  Lancets MISC Use to check blood sugar three times daily 08/26/23   Danford, Earl Lites, MD  lisinopril (ZESTRIL) 20 MG tablet Take 1 tablet (20 mg total) by mouth daily. Patient not taking: Reported on 07/19/2023 05/14/23 07/03/23  Marinda Elk, MD  Past Surgical History Past Surgical History:  Procedure Laterality Date   HAND SURGERY     Family History Family History  Family history unknown: Yes    Social History Social History   Tobacco Use   Smoking status: Every Day    Types: Cigarettes   Smokeless tobacco: Never  Vaping Use   Vaping status: Unknown  Substance Use Topics   Alcohol use: Never   Drug use: Yes    Types: Amphetamines, Marijuana   Allergies Patient has no  known allergies.  Review of Systems Review of Systems  Gastrointestinal:  Positive for nausea and vomiting.  Endocrine: Positive for polydipsia and polyuria.    Physical Exam Vital Signs  I have reviewed the triage vital signs BP (!) 162/106   Pulse 71   Temp 98.4 F (36.9 C) (Oral)   Resp 12   Ht 5\' 11"  (1.803 m)   Wt 94.4 kg   SpO2 99%   BMI 29.03 kg/m   Physical Exam Constitutional:      General: He is not in acute distress.    Appearance: Normal appearance. He is ill-appearing.  HENT:     Head: Normocephalic and atraumatic.     Nose: No congestion or rhinorrhea.  Eyes:     General:        Right eye: No discharge.        Left eye: No discharge.     Extraocular Movements: Extraocular movements intact.     Pupils: Pupils are equal, round, and reactive to light.  Cardiovascular:     Rate and Rhythm: Normal rate and regular rhythm.     Heart sounds: No murmur heard. Pulmonary:     Effort: No respiratory distress.     Breath sounds: No wheezing or rales.  Abdominal:     General: There is no distension.     Tenderness: There is no abdominal tenderness.  Musculoskeletal:        General: Normal range of motion.     Cervical back: Normal range of motion.  Skin:    General: Skin is warm and dry.  Neurological:     General: No focal deficit present.     Mental Status: He is alert.     ED Results and Treatments Labs (all labs ordered are listed, but only abnormal results are displayed) Labs Reviewed  BETA-HYDROXYBUTYRIC ACID - Abnormal; Notable for the following components:      Result Value   Beta-Hydroxybutyric Acid 0.42 (*)    All other components within normal limits  URINALYSIS, ROUTINE W REFLEX MICROSCOPIC - Abnormal; Notable for the following components:   Color, Urine COLORLESS (*)    Glucose, UA >=500 (*)    Hgb urine dipstick SMALL (*)    All other components within normal limits  COMPREHENSIVE METABOLIC PANEL - Abnormal; Notable for the following  components:   Sodium 127 (*)    Chloride 92 (*)    Glucose, Bld 810 (*)    All other components within normal limits  BLOOD GAS, VENOUS - Abnormal; Notable for the following components:   pO2, Ven 65 (*)    Bicarbonate 33.0 (*)    Acid-Base Excess 6.9 (*)    All other components within normal limits  BASIC METABOLIC PANEL - Abnormal; Notable for the following components:   Glucose, Bld 214 (*)    All other components within normal limits  BASIC METABOLIC PANEL - Abnormal; Notable for the following components:   Sodium 132 (*)  Glucose, Bld 189 (*)    Calcium 8.4 (*)    All other components within normal limits  OSMOLALITY - Abnormal; Notable for the following components:   Osmolality 303 (*)    All other components within normal limits  CBG MONITORING, ED - Abnormal; Notable for the following components:   Glucose-Capillary >600 (*)    All other components within normal limits  CBG MONITORING, ED - Abnormal; Notable for the following components:   Glucose-Capillary >600 (*)    All other components within normal limits  CBG MONITORING, ED - Abnormal; Notable for the following components:   Glucose-Capillary >600 (*)    All other components within normal limits  CBG MONITORING, ED - Abnormal; Notable for the following components:   Glucose-Capillary 362 (*)    All other components within normal limits  CBG MONITORING, ED - Abnormal; Notable for the following components:   Glucose-Capillary 191 (*)    All other components within normal limits  CBG MONITORING, ED - Abnormal; Notable for the following components:   Glucose-Capillary 107 (*)    All other components within normal limits  CBG MONITORING, ED - Abnormal; Notable for the following components:   Glucose-Capillary 130 (*)    All other components within normal limits  CBG MONITORING, ED - Abnormal; Notable for the following components:   Glucose-Capillary 205 (*)    All other components within normal limits  CBG  MONITORING, ED - Abnormal; Notable for the following components:   Glucose-Capillary 178 (*)    All other components within normal limits  BETA-HYDROXYBUTYRIC ACID  CBC WITH DIFFERENTIAL/PLATELET  CBC                                                                                                                          Radiology No results found.  Pertinent labs & imaging results that were available during my care of the patient were reviewed by me and considered in my medical decision making (see MDM for details).  Medications Ordered in ED Medications  dextrose 50 % solution 0-50 mL (has no administration in time range)  enoxaparin (LOVENOX) injection 40 mg (40 mg Subcutaneous Given 08/26/23 0931)  sodium chloride flush (NS) 0.9 % injection 3 mL (3 mLs Intravenous Given 08/26/23 0931)  sodium chloride flush (NS) 0.9 % injection 3 mL (has no administration in time range)  0.9 %  sodium chloride infusion (has no administration in time range)  insulin aspart (novoLOG) injection 0-15 Units (5 Units Subcutaneous Given 08/26/23 0830)  insulin aspart (novoLOG) injection 0-5 Units (has no administration in time range)  lactated ringers bolus 2,000 mL (2,000 mLs Intravenous Bolus 08/25/23 2206)  potassium chloride 10 mEq in 100 mL IVPB (0 mEq Intravenous Stopped 08/26/23 0152)  potassium chloride 10 mEq in 100 mL IVPB (0 mEq Intravenous Stopped 08/26/23 0513)  lactated ringers bolus 1,888 mL (0 mLs Intravenous Stopped 08/26/23 0130)  Procedures .Critical Care  Performed by: Glendora Score, MD Authorized by: Glendora Score, MD   Critical care provider statement:    Critical care time (minutes):  30   Critical care was necessary to treat or prevent imminent or life-threatening deterioration of the following conditions:  Endocrine crisis   Critical care was time  spent personally by me on the following activities:  Development of treatment plan with patient or surrogate, discussions with consultants, evaluation of patient's response to treatment, examination of patient, ordering and review of laboratory studies, ordering and review of radiographic studies, ordering and performing treatments and interventions, pulse oximetry, re-evaluation of patient's condition and review of old charts   (including critical care time)  Medical Decision Making / ED Course   This patient presents to the ED for concern of hyperglycemia, this involves an extensive number of treatment options, and is a complaint that carries with it a high risk of complications and morbidity.  The differential diagnosis includes DKA, HHS, stress hyperglycemia, medication noncompliance  MDM: Patient seen emergency room for evaluation of hyperglycemia.  Physical exam with dry tacky mucous membranes but is otherwise unremarkable.  Initial blood glucose greater than 600.  Laboratory evaluation with a glucose of 810, pseudohyponatremia to 127, pH 7.41 within normal beta-hydroxybutyrate and no ketones in the urine.  Patient started on an insulin drip and will require hospital admission for severe hyperglycemia.   Additional history obtained:  -External records from outside source obtained and reviewed including: Chart review including previous notes, labs, imaging, consultation notes   Lab Tests: -I ordered, reviewed, and interpreted labs.   The pertinent results include:   Labs Reviewed  BETA-HYDROXYBUTYRIC ACID - Abnormal; Notable for the following components:      Result Value   Beta-Hydroxybutyric Acid 0.42 (*)    All other components within normal limits  URINALYSIS, ROUTINE W REFLEX MICROSCOPIC - Abnormal; Notable for the following components:   Color, Urine COLORLESS (*)    Glucose, UA >=500 (*)    Hgb urine dipstick SMALL (*)    All other components within normal limits   COMPREHENSIVE METABOLIC PANEL - Abnormal; Notable for the following components:   Sodium 127 (*)    Chloride 92 (*)    Glucose, Bld 810 (*)    All other components within normal limits  BLOOD GAS, VENOUS - Abnormal; Notable for the following components:   pO2, Ven 65 (*)    Bicarbonate 33.0 (*)    Acid-Base Excess 6.9 (*)    All other components within normal limits  BASIC METABOLIC PANEL - Abnormal; Notable for the following components:   Glucose, Bld 214 (*)    All other components within normal limits  BASIC METABOLIC PANEL - Abnormal; Notable for the following components:   Sodium 132 (*)    Glucose, Bld 189 (*)    Calcium 8.4 (*)    All other components within normal limits  OSMOLALITY - Abnormal; Notable for the following components:   Osmolality 303 (*)    All other components within normal limits  CBG MONITORING, ED - Abnormal; Notable for the following components:   Glucose-Capillary >600 (*)    All other components within normal limits  CBG MONITORING, ED - Abnormal; Notable for the following components:   Glucose-Capillary >600 (*)    All other components within normal limits  CBG MONITORING, ED - Abnormal; Notable for the following components:   Glucose-Capillary >600 (*)    All other components within normal limits  CBG MONITORING, ED - Abnormal; Notable for the following components:   Glucose-Capillary 362 (*)    All other components within normal limits  CBG MONITORING, ED - Abnormal; Notable for the following components:   Glucose-Capillary 191 (*)    All other components within normal limits  CBG MONITORING, ED - Abnormal; Notable for the following components:   Glucose-Capillary 107 (*)    All other components within normal limits  CBG MONITORING, ED - Abnormal; Notable for the following components:   Glucose-Capillary 130 (*)    All other components within normal limits  CBG MONITORING, ED - Abnormal; Notable for the following components:    Glucose-Capillary 205 (*)    All other components within normal limits  CBG MONITORING, ED - Abnormal; Notable for the following components:   Glucose-Capillary 178 (*)    All other components within normal limits  BETA-HYDROXYBUTYRIC ACID  CBC WITH DIFFERENTIAL/PLATELET  CBC      Medicines ordered and prescription drug management: Meds ordered this encounter  Medications   lactated ringers bolus 2,000 mL   DISCONTD: lactated ringers bolus 20 mL/kg   DISCONTD: insulin regular, human (MYXREDLIN) 100 units/ 100 mL infusion    EndoTool low target::   140    EndoTool high target::   180    Type of Diabetes:   Type 1    Mode of Therapy:   ENDOX1 for DKA    Start Method:   EndoTool to calculate   dextrose 50 % solution 0-50 mL   potassium chloride 10 mEq in 100 mL IVPB   DISCONTD: lactated ringers infusion   DISCONTD: dextrose 5 % in lactated ringers infusion   enoxaparin (LOVENOX) injection 40 mg   DISCONTD: lactated ringers bolus 1,888 mL   DISCONTD: insulin regular, human (MYXREDLIN) 100 units/ 100 mL infusion    EndoTool low target::   140    EndoTool high target::   180    Type of Diabetes:   Type 1    Mode of Therapy:   ENDOX2 for HHS    Start Method:   EndoTool to calculate   DISCONTD: lactated ringers infusion   DISCONTD: dextrose 5 % in lactated ringers infusion   potassium chloride 10 mEq in 100 mL IVPB   lactated ringers bolus 1,888 mL   sodium chloride flush (NS) 0.9 % injection 3 mL   sodium chloride flush (NS) 0.9 % injection 3 mL   0.9 %  sodium chloride infusion   insulin aspart (novoLOG) injection 0-15 Units    Correction coverage::   Moderate (average weight, post-op)    CBG < 70::   Implement Hypoglycemia Standing Orders and refer to Hypoglycemia Standing Orders sidebar report    CBG 70 - 120::   0 units    CBG 121 - 150::   2 units    CBG 151 - 200::   3 units    CBG 201 - 250::   5 units    CBG 251 - 300::   8 units    CBG 301 - 350::   11 units    CBG  351 - 400::   15 units    CBG > 400:   call MD and obtain STAT lab verification   insulin aspart (novoLOG) injection 0-5 Units    Correction coverage::   HS scale    CBG < 70::   Implement Hypoglycemia Standing Orders and refer to Hypoglycemia Standing Orders sidebar report    CBG 70 -  120::   0 units    CBG 121 - 150::   0 units    CBG 151 - 200::   0 units    CBG 201 - 250::   2 units    CBG 251 - 300::   3 units    CBG 301 - 350::   4 units    CBG 351 - 400::   5 units    CBG > 400:   call MD and obtain STAT lab verification   blood glucose meter kit and supplies KIT    Sig: Use to check blood sugar three times daily    Dispense:  1 each    Refill:  0   Glucose Blood (BLOOD GLUCOSE TEST STRIPS) STRP    Sig: Use test strip to check blood sugar three times daily    Dispense:  100 strip    Refill:  0   insulin lispro (HUMALOG) 100 UNIT/ML KwikPen    Sig: Inject 8 Units into the skin 3 (three) times daily with meals. Only take if eating a meal AND Blood Glucose (BG) is 80 or higher.    Dispense:  30 mL    Refill:  11   insulin glargine-yfgn (SEMGLEE) 100 UNIT/ML Pen    Sig: Inject 25 Units into the skin at bedtime.    Dispense:  9 mL    Refill:  11    May substitute as needed per insurance.   Insulin Pen Needle (PEN NEEDLES) 31G X 5 MM MISC    Sig: Use  three times daily    Dispense:  100 each    Refill:  2   Lancet Device MISC    Sig: 1 each by Does not apply route 3 (three) times daily. May dispense any manufacturer covered by patient's insurance.    Dispense:  1 each    Refill:  0   Lancets MISC    Sig: Use to check blood sugar three times daily    Dispense:  100 each    Refill:  2    -I have reviewed the patients home medicines and have made adjustments as needed  Critical interventions Insulin drip, fluid resuscitation    Cardiac Monitoring: The patient was maintained on a cardiac monitor.  I personally viewed and interpreted the cardiac monitored which showed  an underlying rhythm of: NSR  Social Determinants of Health:  Factors impacting patients care include: Has run out of his insulin   Reevaluation: After the interventions noted above, I reevaluated the patient and found that they have :improved  Co morbidities that complicate the patient evaluation  Past Medical History:  Diagnosis Date   Diabetes mellitus without complication (HCC)    DKA (diabetic ketoacidosis) (HCC) 05/08/2022   HTN (hypertension) 05/08/2022   Hyperlipidemia    Hyperosmolar hyperglycemic state (HHS) (HCC) 05/08/2022   Hypertension    Hypoglycemia 07/02/2019   Pseudohyponatremia 05/08/2022      Dispostion: I considered admission for this patient, and patient require hospital admission for severe hyperglycemia     Final Clinical Impression(s) / ED Diagnoses Final diagnoses:  Hyperglycemia     @PCDICTATION @    Glendora Score, MD 08/26/23 1125

## 2023-08-26 NOTE — Discharge Planning (Signed)
RNCM consulted with leadership, Lafonda Mosses, RN for utilizing petty cash.  Lafonda Mosses advised that we are unable to utilize petty cash for $80 copay.  RNCM suggests charging to AR account so that pt may have Rx upon discharge.  Pharmacy in agreement.  Nat Lowenthal J. Lucretia Roers, RN, BSN, NCM  Transitions of Care  Nurse Case Manager  [facility] Emergency Departments  Operative Services  [phone]

## 2023-08-26 NOTE — Discharge Planning (Signed)
Case Management consulted regarding pt not being able to afford Rx insulin.  RNCM notes that pt has insurance; suggests sending Rx to [facility] at [employer] for price check to see if we can help with petty cash.  RNCM will continue to follow  Johnni Wunschel J. Lucretia Roers, RN, BSN, NCM  Transitions of Care  Nurse Case Manager  [facility] Emergency Departments  Operative Services  [phone]

## 2023-08-26 NOTE — ED Notes (Signed)
Pt. Stating his blood glucose is low. Pt. BG currently 178. Pt. Stated he does not want breakfast, only graham crackers. Graham crackers given to pt.

## 2023-08-26 NOTE — ED Notes (Signed)
Pt reporting that he has been having CP since leaving the hospital today after being admitted for DKA. Pain worse to palpation. Shortness of breath. No nausea/vomiting.

## 2023-08-27 DIAGNOSIS — R0789 Other chest pain: Secondary | ICD-10-CM | POA: Diagnosis not present

## 2023-08-27 LAB — BASIC METABOLIC PANEL
Anion gap: 9 (ref 5–15)
BUN: 25 mg/dL — ABNORMAL HIGH (ref 6–20)
CO2: 24 mmol/L (ref 22–32)
Calcium: 8.4 mg/dL — ABNORMAL LOW (ref 8.9–10.3)
Chloride: 101 mmol/L (ref 98–111)
Creatinine, Ser: 1.29 mg/dL — ABNORMAL HIGH (ref 0.61–1.24)
GFR, Estimated: >60 mL/min (ref >60)
Glucose, Bld: 249 mg/dL — ABNORMAL HIGH (ref 70–99)
Potassium: 4 mmol/L (ref 3.5–5.1)
Sodium: 134 mmol/L — ABNORMAL LOW (ref 135–145)

## 2023-08-27 LAB — CBC
HCT: 41 % (ref 39.0–52.0)
Hemoglobin: 14 g/dL (ref 13.0–17.0)
MCH: 28.7 pg (ref 26.0–34.0)
MCHC: 34.1 g/dL (ref 30.0–36.0)
MCV: 84.2 fL (ref 80.0–100.0)
Platelets: 210 10*3/uL (ref 150–400)
RBC: 4.87 MIL/uL (ref 4.22–5.81)
RDW: 12.7 % (ref 11.5–15.5)
WBC: 5.4 10*3/uL (ref 4.0–10.5)
nRBC: 0 % (ref 0.0–0.2)

## 2023-08-27 LAB — TROPONIN I (HIGH SENSITIVITY): Troponin I (High Sensitivity): 4 ng/L (ref <18)

## 2023-08-27 NOTE — ED Notes (Signed)
Called pt in lobby to perform 2nd troponin labs, pt refused.

## 2023-09-01 ENCOUNTER — Emergency Department (HOSPITAL_COMMUNITY)
Admission: EM | Admit: 2023-09-01 | Discharge: 2023-09-02 | Discharge status: Against Medical Advice | Payer: No Typology Code available for payment source | Attending: Emergency Medicine | Admitting: Emergency Medicine

## 2023-09-01 DIAGNOSIS — R059 Cough, unspecified: Secondary | ICD-10-CM | POA: Diagnosis not present

## 2023-09-01 DIAGNOSIS — Z743 Need for continuous supervision: Secondary | ICD-10-CM | POA: Diagnosis not present

## 2023-09-01 DIAGNOSIS — R5383 Other fatigue: Secondary | ICD-10-CM | POA: Insufficient documentation

## 2023-09-01 DIAGNOSIS — M791 Myalgia, unspecified site: Secondary | ICD-10-CM | POA: Diagnosis not present

## 2023-09-01 DIAGNOSIS — E162 Hypoglycemia, unspecified: Secondary | ICD-10-CM | POA: Diagnosis not present

## 2023-09-01 DIAGNOSIS — E11649 Type 2 diabetes mellitus with hypoglycemia without coma: Secondary | ICD-10-CM | POA: Diagnosis not present

## 2023-09-01 DIAGNOSIS — Z5321 Procedure and treatment not carried out due to patient leaving prior to being seen by health care provider: Secondary | ICD-10-CM | POA: Insufficient documentation

## 2023-09-01 DIAGNOSIS — Z1152 Encounter for screening for COVID-19: Secondary | ICD-10-CM | POA: Diagnosis not present

## 2023-09-01 LAB — RESP PANEL BY RT-PCR (RSV, FLU A&B, COVID)  RVPGX2
Influenza A by PCR: NEGATIVE
Influenza B by PCR: NEGATIVE
Resp Syncytial Virus by PCR: NEGATIVE
SARS Coronavirus 2 by RT PCR: NEGATIVE

## 2023-09-01 LAB — CBG MONITORING, ED: Glucose-Capillary: 85 mg/dL (ref 70–99)

## 2023-09-01 NOTE — ED Provider Triage Note (Signed)
Emergency Medicine Provider Triage Evaluation Note  [name] [name] , a 38 y.o. male  was evaluated in triage.  Pt complains of hypoglycemia.  History of type 2 diabetes.  Patient states that he has had multiple episodes of low blood sugar recently and was concerned and called EMS.  EMS noted patient's sugar was at 76.  Patient ate a chocolate bar.  Patient does report that he is currently homeless.  Endorsing some generalized fatigue and bodyaches.  No fever.  Review of Systems  Positive: As above Negative: As above  Physical Exam  Pulse (!) 120   Temp 98.6 F (37 C) (Oral)   Resp 19   SpO2 100%  Gen:   Awake, no distress   Resp:  Normal effort  MSK:   Moves extremities without difficulty  Other:    Medical Decision Making  Medically screening exam initiated at 9:34 PM.  Appropriate orders placed.  ITZEL LOWRIMORE was informed that the remainder of the evaluation will be completed by another provider, this initial triage assessment does not replace that evaluation, and the importance of remaining in the ED until their evaluation is complete.     Smitty Knudsen, PA-C 09/01/23 2135

## 2023-09-01 NOTE — ED Triage Notes (Addendum)
Pt BIB EMS from [facility] with c/o low CBG, EMS took pt BG, it was 76 and pt ate chocolate bar on the way to the hospital. EMS reports pt homeless.

## 2023-09-02 LAB — CBG MONITORING, ED: Glucose-Capillary: 73 mg/dL (ref 70–99)

## 2023-09-10 ENCOUNTER — Other Ambulatory Visit (HOSPITAL_COMMUNITY): Payer: Self-pay

## 2023-09-10 ENCOUNTER — Emergency Department (HOSPITAL_COMMUNITY)
Admission: EM | Admit: 2023-09-10 | Discharge: 2023-09-10 | Disposition: A | Discharge status: Home / Self Care | Payer: No Typology Code available for payment source | Attending: Emergency Medicine | Admitting: Emergency Medicine

## 2023-09-10 ENCOUNTER — Other Ambulatory Visit: Payer: Self-pay

## 2023-09-10 ENCOUNTER — Encounter (HOSPITAL_COMMUNITY): Payer: Self-pay

## 2023-09-10 ENCOUNTER — Emergency Department (HOSPITAL_COMMUNITY): Payer: No Typology Code available for payment source

## 2023-09-10 DIAGNOSIS — Z79899 Other long term (current) drug therapy: Secondary | ICD-10-CM | POA: Insufficient documentation

## 2023-09-10 DIAGNOSIS — I10 Essential (primary) hypertension: Secondary | ICD-10-CM | POA: Diagnosis not present

## 2023-09-10 DIAGNOSIS — Z20822 Contact with and (suspected) exposure to covid-19: Secondary | ICD-10-CM | POA: Insufficient documentation

## 2023-09-10 DIAGNOSIS — Z794 Long term (current) use of insulin: Secondary | ICD-10-CM | POA: Insufficient documentation

## 2023-09-10 DIAGNOSIS — R739 Hyperglycemia, unspecified: Secondary | ICD-10-CM

## 2023-09-10 DIAGNOSIS — R0789 Other chest pain: Secondary | ICD-10-CM | POA: Insufficient documentation

## 2023-09-10 DIAGNOSIS — E1065 Type 1 diabetes mellitus with hyperglycemia: Secondary | ICD-10-CM | POA: Diagnosis not present

## 2023-09-10 DIAGNOSIS — Z743 Need for continuous supervision: Secondary | ICD-10-CM | POA: Diagnosis not present

## 2023-09-10 DIAGNOSIS — E1165 Type 2 diabetes mellitus with hyperglycemia: Secondary | ICD-10-CM | POA: Diagnosis not present

## 2023-09-10 DIAGNOSIS — R079 Chest pain, unspecified: Secondary | ICD-10-CM | POA: Diagnosis not present

## 2023-09-10 LAB — CBC
HCT: 44.3 % (ref 39.0–52.0)
Hemoglobin: 14.4 g/dL (ref 13.0–17.0)
MCH: 28.1 pg (ref 26.0–34.0)
MCHC: 32.5 g/dL (ref 30.0–36.0)
MCV: 86.5 fL (ref 80.0–100.0)
Platelets: 243 10*3/uL (ref 150–400)
RBC: 5.12 MIL/uL (ref 4.22–5.81)
RDW: 12.8 % (ref 11.5–15.5)
WBC: 5 10*3/uL (ref 4.0–10.5)
nRBC: 0 % (ref 0.0–0.2)

## 2023-09-10 LAB — TROPONIN I (HIGH SENSITIVITY)
Troponin I (High Sensitivity): 6 ng/L (ref <18)
Troponin I (High Sensitivity): 6 ng/L (ref <18)

## 2023-09-10 LAB — RESP PANEL BY RT-PCR (RSV, FLU A&B, COVID)  RVPGX2
Influenza A by PCR: NEGATIVE
Influenza B by PCR: NEGATIVE
Resp Syncytial Virus by PCR: NEGATIVE
SARS Coronavirus 2 by RT PCR: NEGATIVE

## 2023-09-10 LAB — BASIC METABOLIC PANEL
Anion gap: 12 (ref 5–15)
BUN: 28 mg/dL — ABNORMAL HIGH (ref 6–20)
CO2: 19 mmol/L — ABNORMAL LOW (ref 22–32)
Calcium: 9 mg/dL (ref 8.9–10.3)
Chloride: 100 mmol/L (ref 98–111)
Creatinine, Ser: 1.37 mg/dL — ABNORMAL HIGH (ref 0.61–1.24)
GFR, Estimated: >60 mL/min (ref >60)
Glucose, Bld: 425 mg/dL — ABNORMAL HIGH (ref 70–99)
Potassium: 4.5 mmol/L (ref 3.5–5.1)
Sodium: 131 mmol/L — ABNORMAL LOW (ref 135–145)

## 2023-09-10 LAB — CBG MONITORING, ED: Glucose-Capillary: 284 mg/dL — ABNORMAL HIGH (ref 70–99)

## 2023-09-10 LAB — D-DIMER, QUANTITATIVE: D-Dimer, Quant: 0.28 ug{FEU}/mL (ref 0.00–0.50)

## 2023-09-10 MED ORDER — LACTATED RINGERS IV BOLUS
1000.0000 mL | Freq: Once | INTRAVENOUS | Status: AC
  Administered 2023-09-10: 1000 mL via INTRAVENOUS

## 2023-09-10 MED ORDER — LIDOCAINE 5 % EX PTCH
1.0000 | MEDICATED_PATCH | CUTANEOUS | Status: DC
Start: 2023-09-10 — End: 2023-09-10
  Administered 2023-09-10: 1 via TRANSDERMAL
  Filled 2023-09-10: qty 1

## 2023-09-10 MED ORDER — KETOROLAC TROMETHAMINE 15 MG/ML IJ SOLN
15.0000 mg | Freq: Once | INTRAMUSCULAR | Status: AC
Start: 2023-09-10 — End: 2023-09-10
  Administered 2023-09-10: 15 mg via INTRAVENOUS
  Filled 2023-09-10: qty 1

## 2023-09-10 MED ORDER — LIDOCAINE 5 % EX PTCH
1.0000 | MEDICATED_PATCH | CUTANEOUS | 0 refills | Status: DC
  Filled 2023-09-10: qty 30, 30d supply, fill #0

## 2023-09-10 MED ORDER — INSULIN ASPART 100 UNIT/ML IJ SOLN
10.0000 [IU] | Freq: Once | INTRAMUSCULAR | Status: AC
  Administered 2023-09-10: 10 [IU] via SUBCUTANEOUS
  Filled 2023-09-10: qty 0.1

## 2023-09-10 MED ORDER — NAPROXEN 375 MG PO TABS
375.0000 mg | ORAL_TABLET | Freq: Two times a day (BID) | ORAL | 0 refills | Status: DC
  Filled 2023-09-10: qty 20, 10d supply, fill #0

## 2023-09-10 NOTE — ED Provider Notes (Signed)
  Physical Exam  BP (!) 158/116   Pulse 95   Temp 97.6 F (36.4 C) (Oral)   Resp 15   Ht 5\' 8"  (1.727 m)   Wt 93 kg   SpO2 100%   BMI 31.17 kg/m   Physical Exam Constitutional:      General: He is not in acute distress.    Appearance: Normal appearance.  HENT:     Head: Normocephalic and atraumatic.     Nose: No congestion or rhinorrhea.  Eyes:     General:        Right eye: No discharge.        Left eye: No discharge.     Extraocular Movements: Extraocular movements intact.     Pupils: Pupils are equal, round, and reactive to light.  Cardiovascular:     Rate and Rhythm: Normal rate and regular rhythm.     Heart sounds: No murmur heard. Pulmonary:     Effort: No respiratory distress.     Breath sounds: No wheezing or rales.  Chest:     Chest wall: Tenderness present.  Abdominal:     General: There is no distension.     Tenderness: There is no abdominal tenderness.  Musculoskeletal:        General: Normal range of motion.     Cervical back: Normal range of motion.  Skin:    General: Skin is warm and dry.  Neurological:     General: No focal deficit present.     Mental Status: He is alert.     Procedures  Procedures  ED Course / MDM    Medical Decision Making Amount and/or Complexity of Data Reviewed Labs: ordered. Radiology: ordered.  Risk Prescription drug management.   Patient received in handoff.  Chest wall pain pending delta troponin.  Patient does have reproducible tenderness over the chest wall.  Troponin and delta troponin are negative.  Dimer is normal.  Chest x-ray is normal.  ECG nonischemic.  Does not meet inpatient criteria for admission at this time and was discharged with outpatient follow-up.  Suspect costochondritis.       Glendora Score, MD 09/10/23 [phone]

## 2023-09-10 NOTE — ED Provider Notes (Signed)
[place] EMERGENCY DEPARTMENT AT [facility] Provider Note   CSN: [id number] Arrival date & time: 09/10/23  4098     History  Chief Complaint  Patient presents with   Chest Pain    [name] [name] is a 38 y.o. male.   Chest Pain    38 year old male with medical history significant for diabetes mellitus, DKA and HHS, HTN, HLD, presenting to the emergency department with chest pain.  The patient states that he has had roughly 24 hours of sharp substernal chest discomfort.  He states that it is worse when he takes a deep breath.  Slightly better when getting up and leaning forward.  No fevers, chills, no cough.  No ripping or tearing component, not migratory.  No other aggravating or alleviating factors.  Also states that his blood sugars have been running high recently.  Home Medications Prior to Admission medications   Medication Sig Start Date End Date Taking? Authorizing Provider  albuterol (VENTOLIN HFA) 108 (90 Base) MCG/ACT inhaler Inhale 2 puffs into the lungs every 6 (six) hours as needed for wheezing or shortness of breath. 07/21/23   Pokhrel, Rebekah Chesterfield, MD  blood glucose meter kit and supplies KIT Use to check blood sugar three times daily 08/26/23   Danford, Earl Lites, MD  Glucose Blood (BLOOD GLUCOSE TEST STRIPS) STRP Use test strip to check blood sugar three times daily 08/26/23   Danford, Earl Lites, MD  insulin glargine-yfgn (SEMGLEE) 100 UNIT/ML Pen Inject 25 Units into the skin at bedtime. 08/26/23 08/25/24  Danford, Earl Lites, MD  insulin lispro (HUMALOG) 100 UNIT/ML KwikPen Inject 8 Units into the skin 3 (three) times daily with meals. Only take if eating a meal AND Blood Glucose (BG) is 80 or higher. 08/26/23 10/28/23  Danford, Earl Lites, MD  Insulin Pen Needle (PEN NEEDLES) 31G X 5 MM MISC Use  three times daily 08/26/23   Danford, Earl Lites, MD  Lancet Device MISC 1 each by Does not apply route 3 (three) times daily. May dispense any  manufacturer covered by patient's insurance. 08/26/23   Alberteen Sam, MD  Lancets MISC Use to check blood sugar three times daily 08/26/23   Danford, Earl Lites, MD  lisinopril (ZESTRIL) 20 MG tablet Take 1 tablet (20 mg total) by mouth daily. Patient not taking: Reported on 07/19/2023 05/14/23 07/03/23  Marinda Elk, MD      Allergies    Patient has no known allergies.    Review of Systems   Review of Systems  Cardiovascular:  Positive for chest pain.  All other systems reviewed and are negative.   Physical Exam Updated Vital Signs BP (!) 158/116   Pulse 95   Temp 98.4 F (36.9 C) (Oral)   Resp 15   Ht 5\' 8"  (1.727 m)   Wt 93 kg   SpO2 100%   BMI 31.17 kg/m  Physical Exam Vitals and nursing note reviewed.  Constitutional:      General: He is not in acute distress.    Appearance: He is well-developed.  HENT:     Head: Normocephalic and atraumatic.  Eyes:     Conjunctiva/sclera: Conjunctivae normal.  Cardiovascular:     Rate and Rhythm: Normal rate and regular rhythm.     Heart sounds: No murmur heard. Pulmonary:     Effort: Pulmonary effort is normal. No respiratory distress.     Breath sounds: Normal breath sounds.  Chest:     Comments: Bilateral parasternal chest wall  tenderness to palpation that reproduces the patient's pain Abdominal:     Palpations: Abdomen is soft.     Tenderness: There is no abdominal tenderness.  Musculoskeletal:        General: No swelling.     Cervical back: Neck supple.  Skin:    General: Skin is warm and dry.     Capillary Refill: Capillary refill takes less than 2 seconds.  Neurological:     Mental Status: He is alert.  Psychiatric:        Mood and Affect: Mood normal.     ED Results / Procedures / Treatments   Labs (all labs ordered are listed, but only abnormal results are displayed) Labs Reviewed  BASIC METABOLIC PANEL - Abnormal; Notable for the following components:      Result Value   Sodium 131 (*)     CO2 19 (*)    Glucose, Bld 425 (*)    BUN 28 (*)    Creatinine, Ser 1.37 (*)    All other components within normal limits  RESP PANEL BY RT-PCR (RSV, FLU A&B, COVID)  RVPGX2  CBC  D-DIMER, QUANTITATIVE  CBG MONITORING, ED  TROPONIN I (HIGH SENSITIVITY)  TROPONIN I (HIGH SENSITIVITY)    EKG EKG Interpretation Date/Time:  Wednesday September 10 2023 03:25:42 EST Ventricular Rate:  101 PR Interval:  126 QRS Duration:  103 QT Interval:  358 QTC Calculation: 464 R Axis:   -52  Text Interpretation: Sinus tachycardia LAD, consider left anterior fascicular block Low voltage, precordial leads Probable anteroseptal infarct, old Confirmed by Ernie Avena ([id number]) on 09/10/2023 3:56:05 AM  Radiology DG Chest 2 View Result Date: 09/10/2023 CLINICAL DATA:  Mid chest pain on and off for several weeks EXAM: CHEST - 2 VIEW COMPARISON:  08/27/2023 FINDINGS: Stable cardiomediastinal silhouette. No focal consolidation, pleural effusion, or pneumothorax. No displaced rib fractures. IMPRESSION: No active cardiopulmonary disease. Electronically Signed   By: Minerva Fester M.D.   On: 09/10/2023 03:44    Procedures Procedures    Medications Ordered in ED Medications  lidocaine (LIDODERM) 5 % 1 patch (1 patch Transdermal Patch Applied 09/10/23 0645)  insulin aspart (novoLOG) injection 10 Units (has no administration in time range)  lactated ringers bolus 1,000 mL (1,000 mLs Intravenous New Bag/Given 09/10/23 0645)  ketorolac (TORADOL) 15 MG/ML injection 15 mg (15 mg Intravenous Given 09/10/23 1610)    ED Course/ Medical Decision Making/ A&P                                 Medical Decision Making Amount and/or Complexity of Data Reviewed Labs: ordered. Radiology: ordered.  Risk Prescription drug management.     38 year old male with medical history significant for diabetes mellitus, DKA and HHS, HTN, HLD, presenting to the emergency department with chest pain.  The patient states that he  has had roughly 24 hours of sharp substernal chest discomfort.  He states that it is worse when he takes a deep breath.  Slightly better when getting up and leaning forward.  No fevers, chills, no cough.  No ripping or tearing component, not migratory.  No other aggravating or alleviating factors.  Also states that his blood sugars have been running high recently.  On arrival, the patient was afebrile, mildly borderline tachycardic heart rate 100, not tachypneic RR 17, hypertensive BP 168/100, improved on recheck without immediate intervention, saturating 99% on room air.  Sinus rhythm noted on  cardiac telemetry on my evaluation.  Patient on physical exam had reproducible chest wall tenderness to palpation.  Low concern for ACS, considered pericarditis, considered PE, symptoms not as consistent with GERD.  Considered musculoskeletal chest pain based on the patient's exam.  Very low concern for aortic dissection.  2 + symmetric bilateral pulses.  Initial EKG revealed sinus tachycardia, heart rate 101, no evidence of STEMI.  A chest x-ray was performed revealed no active cardiopulmonary disease.  Laboratory evaluation revealed initial cardiac troponin 6, CBC without a leukocytosis or anemia, BMP with hyperglycemia and evidence of an AKI with a serum creatinine of 1.37, mildly elevated BUN at 28, mildly low bicarb at 19 without an elevated anion gap, D-dimer negative at 0.28, COVID, flu, RSV PCR testing negative.  A delta troponin was pending.  Plan at time of signout to reassess the patient following fluid resuscitation, pain medicine administered via IV Toradol as well as a lidocaine patch.  Plan for likely discharge home pending improvement in the patient's blood glucose following fluids. Signout given to Dr. Posey Rea at 0730, dispo pending results of reassessment.   Final Clinical Impression(s) / ED Diagnoses Final diagnoses:  Chest wall pain  Hyperglycemia    Rx / DC Orders ED Discharge Orders      None         Ernie Avena, MD 09/10/23 [phone]

## 2023-09-10 NOTE — ED Triage Notes (Signed)
Chest pain that started 2 hours ago, center of chest and hurts worse when he takes a deep breath.

## 2023-09-10 NOTE — ED Notes (Signed)
Pt sleeping in the lobby. I woke pt up to try to do a covid test. Pt closed eyes and went back to sleep. Pt refused to get up for test to be done.

## 2023-09-17 ENCOUNTER — Other Ambulatory Visit (HOSPITAL_COMMUNITY): Payer: Self-pay

## 2023-09-17 ENCOUNTER — Encounter (HOSPITAL_COMMUNITY): Payer: Self-pay | Admitting: Emergency Medicine

## 2023-09-17 ENCOUNTER — Emergency Department (HOSPITAL_COMMUNITY)
Admission: EM | Admit: 2023-09-17 | Discharge: 2023-09-17 | Disposition: A | Discharge status: Home / Self Care | Payer: Medicaid Other | Attending: Emergency Medicine | Admitting: Emergency Medicine

## 2023-09-17 ENCOUNTER — Emergency Department (HOSPITAL_COMMUNITY)
Admission: EM | Admit: 2023-09-17 | Discharge: 2023-09-18 | Disposition: A | Discharge status: Home / Self Care | Payer: No Typology Code available for payment source | Attending: Emergency Medicine | Admitting: Emergency Medicine

## 2023-09-17 ENCOUNTER — Other Ambulatory Visit: Payer: Self-pay

## 2023-09-17 DIAGNOSIS — E1165 Type 2 diabetes mellitus with hyperglycemia: Secondary | ICD-10-CM

## 2023-09-17 DIAGNOSIS — E0865 Diabetes mellitus due to underlying condition with hyperglycemia: Secondary | ICD-10-CM | POA: Diagnosis not present

## 2023-09-17 DIAGNOSIS — R0789 Other chest pain: Secondary | ICD-10-CM | POA: Diagnosis not present

## 2023-09-17 DIAGNOSIS — I10 Essential (primary) hypertension: Secondary | ICD-10-CM | POA: Diagnosis not present

## 2023-09-17 DIAGNOSIS — R300 Dysuria: Secondary | ICD-10-CM | POA: Diagnosis present

## 2023-09-17 DIAGNOSIS — Z794 Long term (current) use of insulin: Secondary | ICD-10-CM | POA: Insufficient documentation

## 2023-09-17 DIAGNOSIS — E1065 Type 1 diabetes mellitus with hyperglycemia: Secondary | ICD-10-CM | POA: Diagnosis not present

## 2023-09-17 DIAGNOSIS — Z79899 Other long term (current) drug therapy: Secondary | ICD-10-CM | POA: Diagnosis not present

## 2023-09-17 DIAGNOSIS — R739 Hyperglycemia, unspecified: Secondary | ICD-10-CM

## 2023-09-17 DIAGNOSIS — Z743 Need for continuous supervision: Secondary | ICD-10-CM | POA: Diagnosis not present

## 2023-09-17 LAB — BASIC METABOLIC PANEL
Anion gap: 10 (ref 5–15)
BUN: 21 mg/dL — ABNORMAL HIGH (ref 6–20)
CO2: 28 mmol/L (ref 22–32)
Calcium: 9.8 mg/dL (ref 8.9–10.3)
Chloride: 92 mmol/L — ABNORMAL LOW (ref 98–111)
Creatinine, Ser: 1.53 mg/dL — ABNORMAL HIGH (ref 0.61–1.24)
GFR, Estimated: 59 mL/min — ABNORMAL LOW (ref >60)
Glucose, Bld: 443 mg/dL — ABNORMAL HIGH (ref 70–99)
Potassium: 4.5 mmol/L (ref 3.5–5.1)
Sodium: 130 mmol/L — ABNORMAL LOW (ref 135–145)

## 2023-09-17 LAB — CBG MONITORING, ED
Glucose-Capillary: 462 mg/dL — ABNORMAL HIGH (ref 70–99)
Glucose-Capillary: 376 mg/dL — ABNORMAL HIGH (ref 70–99)
Glucose-Capillary: 402 mg/dL — ABNORMAL HIGH (ref 70–99)
Glucose-Capillary: 361 mg/dL — ABNORMAL HIGH (ref 70–99)
Glucose-Capillary: 288 mg/dL — ABNORMAL HIGH (ref 70–99)

## 2023-09-17 LAB — CBC WITH DIFFERENTIAL/PLATELET
Abs Immature Granulocytes: 0.01 10*3/uL (ref 0.00–0.07)
Basophils Absolute: 0.1 10*3/uL (ref 0.0–0.1)
Basophils Relative: 1 %
Eosinophils Absolute: 0.2 10*3/uL (ref 0.0–0.5)
Eosinophils Relative: 3 %
HCT: 49.6 % (ref 39.0–52.0)
Hemoglobin: 16.4 g/dL (ref 13.0–17.0)
Immature Granulocytes: 0 %
Lymphocytes Relative: 36 %
Lymphs Abs: 2.3 10*3/uL (ref 0.7–4.0)
MCH: 28.2 pg (ref 26.0–34.0)
MCHC: 33.1 g/dL (ref 30.0–36.0)
MCV: 85.2 fL (ref 80.0–100.0)
Monocytes Absolute: 0.5 10*3/uL (ref 0.1–1.0)
Monocytes Relative: 8 %
Neutro Abs: 3.3 10*3/uL (ref 1.7–7.7)
Neutrophils Relative %: 52 %
Platelets: 267 10*3/uL (ref 150–400)
RBC: 5.82 MIL/uL — ABNORMAL HIGH (ref 4.22–5.81)
RDW: 13.2 % (ref 11.5–15.5)
WBC: 6.3 10*3/uL (ref 4.0–10.5)
nRBC: 0 % (ref 0.0–0.2)

## 2023-09-17 LAB — CBC
HCT: 46.3 % (ref 39.0–52.0)
Hemoglobin: 15 g/dL (ref 13.0–17.0)
MCH: 27.7 pg (ref 26.0–34.0)
MCHC: 32.4 g/dL (ref 30.0–36.0)
MCV: 85.4 fL (ref 80.0–100.0)
Platelets: 212 10*3/uL (ref 150–400)
RBC: 5.42 MIL/uL (ref 4.22–5.81)
RDW: 13 % (ref 11.5–15.5)
WBC: 5 10*3/uL (ref 4.0–10.5)
nRBC: 0 % (ref 0.0–0.2)

## 2023-09-17 MED ORDER — SODIUM CHLORIDE 0.9 % IV BOLUS
1000.0000 mL | Freq: Once | INTRAVENOUS | Status: AC
  Administered 2023-09-17: 1000 mL via INTRAVENOUS

## 2023-09-17 MED ORDER — INSULIN GLARGINE 100 UNITS/ML SOLOSTAR PEN
50.0000 [IU] | PEN_INJECTOR | Freq: Every day | SUBCUTANEOUS | 3 refills | Status: DC
  Filled 2023-09-17 – 2023-09-18 (×3): qty 45, 90d supply, fill #0

## 2023-09-17 MED ORDER — LACTATED RINGERS IV BOLUS
1000.0000 mL | Freq: Once | INTRAVENOUS | Status: AC
  Administered 2023-09-17: 1000 mL via INTRAVENOUS

## 2023-09-17 MED ORDER — LANCET DEVICE MISC
1.0000 | Freq: Three times a day (TID) | 0 refills | Status: DC
  Filled 2023-09-17: qty 1, fill #0

## 2023-09-17 MED ORDER — INSULIN LISPRO (1 UNIT DIAL) 100 UNIT/ML (KWIKPEN)
8.0000 [IU] | PEN_INJECTOR | Freq: Three times a day (TID) | SUBCUTANEOUS | 11 refills | Status: DC
  Filled 2023-09-17: qty 30, 125d supply, fill #0
  Filled 2023-09-18: qty 15, 63d supply, fill #0

## 2023-09-17 MED ORDER — BLOOD GLUCOSE MONITOR KIT
1.0000 | PACK | Freq: Three times a day (TID) | 0 refills | Status: DC
  Filled 2023-09-17: qty 1, 30d supply, fill #0

## 2023-09-17 MED ORDER — INSULIN ASPART 100 UNIT/ML IJ SOLN
6.0000 [IU] | Freq: Once | INTRAMUSCULAR | Status: AC
Start: 2023-09-17 — End: 2023-09-17
  Administered 2023-09-17: 6 [IU] via SUBCUTANEOUS
  Filled 2023-09-17: qty 0.06

## 2023-09-17 MED ORDER — PEN NEEDLES 31G X 5 MM MISC
1.0000 | Freq: Three times a day (TID) | 2 refills | Status: DC
  Filled 2023-09-17: qty 100, 30d supply, fill #0

## 2023-09-17 MED ORDER — INSULIN ASPART 100 UNIT/ML IJ SOLN
6.0000 [IU] | Freq: Once | INTRAMUSCULAR | Status: AC
  Administered 2023-09-17: 6 [IU] via SUBCUTANEOUS
  Filled 2023-09-17: qty 0.06

## 2023-09-17 MED ORDER — BLOOD GLUCOSE TEST VI STRP
1.0000 | ORAL_STRIP | Freq: Three times a day (TID) | 0 refills | Status: DC
  Filled 2023-09-17: qty 100, 30d supply, fill #0

## 2023-09-17 NOTE — ED Notes (Signed)
[place] sandwich provided to pt - OK per EDP

## 2023-09-17 NOTE — TOC CM/SW Note (Signed)
RNCM consulted regarding PCP establishment. Pt presented to [facility] ED today with hyperglycemia.  RNCM obtained appointment on (4/2), time (0900) with Delorse Limber, NP and placed on After Visit Summary paperwork.  No further case management needs communicated at this time. Shelaine Frie J. Lucretia Roers, RN, BSN, [place] [phone]

## 2023-09-17 NOTE — ED Triage Notes (Signed)
BIBA Per EMS: c/o hyperglycemia. Hx diabetes. Took insulin at 3pm yesterday. CBG 318

## 2023-09-17 NOTE — Discharge Instructions (Addendum)
Have a follow-up appointment with your primary care doctor/2/25.  Please make sure that you arrive 15 minutes early to your 9 AM appointment.  Please continue using insulin as prescribed.  I have refilled your medications.  Please make sure to take your insulin as prescribed.  Continue to monitor your blood sugars.

## 2023-09-17 NOTE — ED Provider Notes (Signed)
[place] EMERGENCY DEPARTMENT AT [facility] Provider Note   CSN: [id number] Arrival date & time: 09/17/23  2226     History {Add pertinent medical, surgical, social history, OB history to HPI:1} Chief Complaint  Patient presents with   Dysuria    [name] [name] is a 38 y.o. male who presents emergency department chief complaint of bladder pain.  This is the patient's 38th visit to the emergency department in the last 6 months.  He was seen this morning with a complaint of hyperglycemia discharge.  Patient states that he has been having the symptoms for weeks.  He states that it was not bothering him when he was in the emergency room this morning.  He denies unprotected intercourse or penile discharge.   Dysuria Presenting symptoms: dysuria        Home Medications Prior to Admission medications   Medication Sig Start Date End Date Taking? Authorizing Provider  albuterol (VENTOLIN HFA) 108 (90 Base) MCG/ACT inhaler Inhale 2 puffs into the lungs every 6 (six) hours as needed for wheezing or shortness of breath. 07/21/23   Pokhrel, Rebekah Chesterfield, MD  blood glucose meter kit and supplies KIT Use to check blood sugar three times daily 09/17/23   Solon Augusta S, PA  Glucose Blood (BLOOD GLUCOSE TEST STRIPS) STRP Use test strip to check blood sugar three times daily 09/17/23   Solon Augusta S, PA  insulin glargine-yfgn (SEMGLEE) 100 UNIT/ML Pen Inject 50 Units into the skin at bedtime. 09/17/23 09/16/24  Gailen Shelter, PA  insulin lispro (HUMALOG) 100 UNIT/ML KwikPen Inject 8 Units into the skin 3 (three) times daily with meals. Only take if eating a meal AND Blood Glucose (BG) is 80 or higher. 09/17/23 11/16/23  Gailen Shelter, PA  Insulin Pen Needle (PEN NEEDLES) 31G X 5 MM MISC Use  three times daily 09/17/23   Gailen Shelter, [place]  Lancet Device MISC Use 3 (three) times daily. 09/17/23   Gailen Shelter, PA  Lancets MISC Use to check blood sugar three times daily 08/26/23    Danford, Earl Lites, MD  lidocaine (LIDODERM) 5 % Place 1 patch onto the skin daily. Remove & Discard patch within 12 hours or as directed by MD 09/10/23   Kommor, Wyn Forster, MD  lisinopril (ZESTRIL) 20 MG tablet Take 1 tablet (20 mg total) by mouth daily. Patient not taking: Reported on 07/19/2023 05/14/23 07/03/23  Marinda Elk, MD  naproxen (NAPROSYN) 375 MG tablet Take 1 tablet (375 mg total) by mouth 2 (two) times daily. 09/10/23   Kommor, Wyn Forster, MD      Allergies    Patient has no known allergies.    Review of Systems   Review of Systems  Genitourinary:  Positive for dysuria.    Physical Exam Updated Vital Signs BP (!) 172/101 (BP Location: Left Arm)   Pulse 87   Temp 98.4 F (36.9 C) (Oral)   Resp 18   SpO2 100%  Physical Exam Vitals and nursing note reviewed.  Constitutional:      General: He is not in acute distress.    Appearance: He is well-developed. He is not diaphoretic.  HENT:     Head: Normocephalic and atraumatic.  Eyes:     General: No scleral icterus.    Conjunctiva/sclera: Conjunctivae normal.  Cardiovascular:     Rate and Rhythm: Normal rate and regular rhythm.     Heart sounds: Normal heart sounds.  Pulmonary:     Effort: Pulmonary  effort is normal. No respiratory distress.     Breath sounds: Normal breath sounds.  Abdominal:     General: There is no distension.     Palpations: Abdomen is soft.     Tenderness: There is no abdominal tenderness. There is no guarding.  Musculoskeletal:     Cervical back: Normal range of motion and neck supple.  Skin:    General: Skin is warm and dry.  Neurological:     Mental Status: He is alert.  Psychiatric:        Behavior: Behavior normal.     ED Results / Procedures / Treatments   Labs (all labs ordered are listed, but only abnormal results are displayed) Labs Reviewed  URINALYSIS, ROUTINE W REFLEX MICROSCOPIC  CBC  BASIC METABOLIC PANEL    EKG None  Radiology No results  found.  Procedures Procedures  {Document cardiac monitor, telemetry assessment procedure when appropriate:1}  Medications Ordered in ED Medications - No data to display  ED Course/ Medical Decision Making/ A&P   {   Click here for ABCD2, HEART and other calculatorsREFRESH Note before signing :1}                              Medical Decision Making Amount and/or Complexity of Data Reviewed Labs: ordered.   ***  {Document critical care time when appropriate:1} {Document review of labs and clinical decision tools ie heart score, Chads2Vasc2 etc:1}  {Document your independent review of radiology images, and any outside records:1} {Document your discussion with family members, caretakers, and with consultants:1} {Document social determinants of health affecting pt's care:1} {Document your decision making why or why not admission, treatments were needed:1} Final Clinical Impression(s) / ED Diagnoses Final diagnoses:  None    Rx / DC Orders ED Discharge Orders     None

## 2023-09-17 NOTE — Inpatient Diabetes Management (Addendum)
Inpatient Diabetes Program Recommendations  AACE/ADA: New Consensus Statement on Inpatient Glycemic Control (2015)  Target Ranges:  Prepandial:   less than 140 mg/dL      Peak postprandial:   less than 180 mg/dL (1-2 hours)      Critically ill patients:  140 - 180 mg/dL   Lab Results  Component Value Date   GLUCAP 402 (H) 09/17/2023   HGBA1C 12.4 (H) 04/20/2023    Review of Glycemic Control  Latest Reference Range & Units 09/17/23 06:35 09/17/23 08:29 09/17/23 09:01  Glucose-Capillary 70 - 99 mg/dL 161 (H) 096 (H) 045 (H)  (H): Data is abnormally high  Diabetes history: DM1(does not make insulin.  Needs correction, basal and meal coverage)  Outpatient Diabetes medications: Semglee 50 units every day, Humalog 15 units TID  Current orders for Inpatient glycemic control: Novolog 6 units x 1 at 08:49  Met with patient at bedside.  He was diagnosed with T1DM at age 38.  He does not have a PCP and he states his Medicaid is inactive.  Asked how he obtains insulins and he states he comes to the ED when he runs out.  Placed TOC consult for possible assistance with establishing him with one of our clinics.  He has a [employer] glucometer.  He does not have stable housing. He has a lot of current stressors and does not take his insulins regularly.  He often stretches his insulins.    Discussed long and short term complications of uncontrolled glucose.  Educated on The Plate Method, CHO's, portion control, CBGs ac/hs, F/U with PCP every 3 months, bring meter to PCP office, and importance of exercise.  Reviewed hypoglycemia, < 70 mg/dL, signs, symptoms and treatments.   Addendum @ [phone]:  Per our [facility] pharmacy Medicaid is active.   Lantus and Novolog are $4 each.  Will continue to follow while inpatient.  Thank you, Dulce Sellar, MSN, CDCES Diabetes Coordinator Inpatient Diabetes Program [phone] (team pager from 8a-5p)

## 2023-09-17 NOTE — ED Provider Notes (Signed)
[place] EMERGENCY DEPARTMENT AT [facility] Provider Note   CSN: [id number] Arrival date & time: 09/17/23  7829     History  Chief Complaint  Patient presents with   Hyperglycemia    [name] [name] is a 38 y.o. male.   Hyperglycemia Patient is a 38 year old male with past medical history significant for DM2, hypertension, hyperlipidemia  Patient is a 38 year old male with past medical history significant for diabetes seems that he poorly controlled his hyperglycemia baseline.  He has multiple ER visits for similar presentations.  He tells me that his blood sugar was normal 2 days ago and yesterday elevated to the 300s he states that he has taken 3 doses of insulin today with no improvement--however, upon clarifying these 3 doses of insulin were yesterday and he does not recall when he took them.  He denies any chest pain difficulty breathing no lightheadedness or dizziness     Home Medications Prior to Admission medications   Medication Sig Start Date End Date Taking? Authorizing Provider  albuterol (VENTOLIN HFA) 108 (90 Base) MCG/ACT inhaler Inhale 2 puffs into the lungs every 6 (six) hours as needed for wheezing or shortness of breath. 07/21/23   Pokhrel, Rebekah Chester[place], MD  blood glucose meter kit and supplies KIT Use to check blood sugar three times daily 09/17/23   Solon Augusta S, PA  Glucose Blood (BLOOD GLUCOSE TEST STRIPS) STRP Use test strip to check blood sugar three times daily 09/17/23   Solon Augusta S, PA  insulin glargine-yfgn (SEMGLEE) 100 UNIT/ML Pen Inject 50 Units into the skin at bedtime. 09/17/23 09/16/24  Gailen Shelter, PA  insulin lispro (HUMALOG) 100 UNIT/ML KwikPen Inject 8 Units into the skin 3 (three) times daily with meals. Only take if eating a meal AND Blood Glucose (BG) is 80 or higher. 09/17/23 11/16/23  Gailen Shelter, PA  Insulin Pen Needle (PEN NEEDLES) 31G X 5 MM MISC Use  three times daily 09/17/23   Gailen Shelter, [place]  Lancet Device  MISC Use 3 (three) times daily. 09/17/23   Gailen Shelter, PA  Lancets MISC Use to check blood sugar three times daily 08/26/23   Danford, Earl Lites, MD  lidocaine (LIDODERM) 5 % Place 1 patch onto the skin daily. Remove & Discard patch within 12 hours or as directed by MD 09/10/23   Kommor, Wyn Forster, MD  lisinopril (ZESTRIL) 20 MG tablet Take 1 tablet (20 mg total) by mouth daily. Patient not taking: Reported on 07/19/2023 05/14/23 07/03/23  Marinda Elk, MD  naproxen (NAPROSYN) 375 MG tablet Take 1 tablet (375 mg total) by mouth 2 (two) times daily. 09/10/23   Kommor, Wyn Forster, MD      Allergies    Patient has no known allergies.    Review of Systems   Review of Systems  Physical Exam Updated Vital Signs BP (!) 154/86 (BP Location: Right Arm)   Pulse 93   Temp 98.3 F (36.8 C) (Oral)   Resp 17   SpO2 100%  Physical Exam Vitals and nursing note reviewed.  Constitutional:      General: He is not in acute distress. HENT:     Head: Normocephalic and atraumatic.     Nose: Nose normal.     Mouth/Throat:     Mouth: Mucous membranes are dry.  Eyes:     General: No scleral icterus. Cardiovascular:     Rate and Rhythm: Normal rate and regular rhythm.     Pulses: Normal  pulses.     Heart sounds: Normal heart sounds.  Pulmonary:     Effort: Pulmonary effort is normal. No respiratory distress.     Breath sounds: No wheezing.  Abdominal:     Palpations: Abdomen is soft.     Tenderness: There is no abdominal tenderness. There is no guarding or rebound.  Musculoskeletal:     Cervical back: Normal range of motion.     Right lower leg: No edema.     Left lower leg: No edema.  Skin:    General: Skin is warm and dry.     Capillary Refill: Capillary refill takes less than 2 seconds.  Neurological:     Mental Status: He is alert. Mental status is at baseline.  Psychiatric:        Mood and Affect: Mood normal.        Behavior: Behavior normal.     ED Results / Procedures /  Treatments   Labs (all labs ordered are listed, but only abnormal results are displayed) Labs Reviewed  CBC WITH DIFFERENTIAL/PLATELET - Abnormal; Notable for the following components:      Result Value   RBC 5.82 (*)    All other components within normal limits  BASIC METABOLIC PANEL - Abnormal; Notable for the following components:   Sodium 130 (*)    Chloride 92 (*)    Glucose, Bld 443 (*)    BUN 21 (*)    Creatinine, Ser 1.53 (*)    GFR, Estimated 59 (*)    All other components within normal limits  CBG MONITORING, ED - Abnormal; Notable for the following components:   Glucose-Capillary 462 (*)    All other components within normal limits  CBG MONITORING, ED - Abnormal; Notable for the following components:   Glucose-Capillary 376 (*)    All other components within normal limits  CBG MONITORING, ED - Abnormal; Notable for the following components:   Glucose-Capillary 402 (*)    All other components within normal limits  CBG MONITORING, ED - Abnormal; Notable for the following components:   Glucose-Capillary 361 (*)    All other components within normal limits  CBG MONITORING, ED - Abnormal; Notable for the following components:   Glucose-Capillary 288 (*)    All other components within normal limits    EKG None  Radiology No results found.  Procedures Procedures    Medications Ordered in ED Medications  sodium chloride 0.9 % bolus 1,000 mL (0 mLs Intravenous Stopped 09/17/23 0919)  lactated ringers bolus 1,000 mL (0 mLs Intravenous Stopped 09/17/23 0919)  insulin aspart (novoLOG) injection 6 Units (6 Units Subcutaneous Given 09/17/23 0849)  sodium chloride 0.9 % bolus 1,000 mL (0 mLs Intravenous Stopped 09/17/23 1302)  insulin aspart (novoLOG) injection 6 Units (6 Units Subcutaneous Given 09/17/23 1126)    ED Course/ Medical Decision Making/ A&P                                 Medical Decision Making Amount and/or Complexity of Data Reviewed Labs:  ordered.  Risk OTC drugs. Prescription drug management.   Patient is a 38 year old male with past medical history significant for DM2, hypertension, hyperlipidemia  Patient is a 38 year old male with past medical history significant for diabetes seems that he poorly controlled his hyperglycemia baseline.  He has multiple ER visits for similar presentations.  He tells me that his blood sugar was normal 2 days ago  and yesterday elevated to the 300s he states that he has taken 3 doses of insulin today with no improvement--however, upon clarifying these 3 doses of insulin were yesterday and he does not recall when he took them.  He denies any chest pain difficulty breathing no lightheadedness or dizziness  Reassuring workup here in the ER.  CBC without leukocytosis or anemia BMP with mild creatinine elevation consistent with dehydration in setting of hyperglycemia.  Blood sugar improved from 443-to-288 with hydration and insulin.  Recommended close outpatient follow-up.  Return precautions to the emergency room were provided.  Patient is tolerating p.o. feels well overall minimally symptomatic.  Will discharge home with refill of insulin and needles    Final Clinical Impression(s) / ED Diagnoses Final diagnoses:  Hyperglycemia  Type 2 diabetes mellitus with hyperglycemia, with long-term current use of insulin (HCC)    Rx / DC Orders ED Discharge Orders          Ordered    insulin lispro (HUMALOG) 100 UNIT/ML KwikPen  3 times daily with meals        09/17/23 1255    insulin glargine-yfgn (SEMGLEE) 100 UNIT/ML Pen  Daily at bedtime       Note to Pharmacy: May substitute as needed per insurance.   09/17/23 1255    Insulin Pen Needle (PEN NEEDLES) 31G X 5 MM MISC  3 times daily        09/17/23 1255    Glucose Blood (BLOOD GLUCOSE TEST STRIPS) STRP  3 times daily        09/17/23 1255    blood glucose meter kit and supplies KIT  3 times daily        09/17/23 1255    Lancet Device MISC  3  times daily       Note to Pharmacy: May dispense any manufacturer covered by [employer].   09/17/23 1255              Gailen Shelter, PA 09/17/23 1528    Elayne Snare K, DO 09/18/23 [phone]

## 2023-09-17 NOTE — ED Triage Notes (Signed)
BIBA per EMS pt endorses bladder pain x3 days, painful urination, no burning. A&Ox4

## 2023-09-17 NOTE — ED Notes (Signed)
Verifed address with patient, updated chart to [place] (not [place])

## 2023-09-18 ENCOUNTER — Encounter (HOSPITAL_COMMUNITY): Payer: Self-pay

## 2023-09-18 ENCOUNTER — Emergency Department (HOSPITAL_COMMUNITY)
Admission: EM | Admit: 2023-09-18 | Discharge: 2023-09-18 | Disposition: A | Discharge status: Home / Self Care | Payer: Medicaid Other | Attending: Emergency Medicine | Admitting: Emergency Medicine

## 2023-09-18 ENCOUNTER — Other Ambulatory Visit (HOSPITAL_COMMUNITY): Payer: Self-pay

## 2023-09-18 ENCOUNTER — Other Ambulatory Visit: Payer: Self-pay

## 2023-09-18 DIAGNOSIS — R531 Weakness: Secondary | ICD-10-CM | POA: Diagnosis not present

## 2023-09-18 DIAGNOSIS — E1165 Type 2 diabetes mellitus with hyperglycemia: Secondary | ICD-10-CM | POA: Diagnosis not present

## 2023-09-18 DIAGNOSIS — R45851 Suicidal ideations: Secondary | ICD-10-CM | POA: Diagnosis not present

## 2023-09-18 DIAGNOSIS — E1065 Type 1 diabetes mellitus with hyperglycemia: Secondary | ICD-10-CM | POA: Diagnosis not present

## 2023-09-18 DIAGNOSIS — R0789 Other chest pain: Secondary | ICD-10-CM | POA: Insufficient documentation

## 2023-09-18 DIAGNOSIS — Z794 Long term (current) use of insulin: Secondary | ICD-10-CM | POA: Diagnosis not present

## 2023-09-18 DIAGNOSIS — R739 Hyperglycemia, unspecified: Secondary | ICD-10-CM

## 2023-09-18 DIAGNOSIS — R000 Tachycardia, unspecified: Secondary | ICD-10-CM | POA: Diagnosis not present

## 2023-09-18 DIAGNOSIS — R079 Chest pain, unspecified: Secondary | ICD-10-CM | POA: Diagnosis not present

## 2023-09-18 DIAGNOSIS — I10 Essential (primary) hypertension: Secondary | ICD-10-CM | POA: Diagnosis not present

## 2023-09-18 LAB — COMPREHENSIVE METABOLIC PANEL
ALT: 16 U/L (ref 0–44)
AST: 23 U/L (ref 15–41)
Albumin: 3.2 g/dL — ABNORMAL LOW (ref 3.5–5.0)
Alkaline Phosphatase: 51 U/L (ref 38–126)
Anion gap: 14 (ref 5–15)
BUN: 22 mg/dL — ABNORMAL HIGH (ref 6–20)
CO2: 21 mmol/L — ABNORMAL LOW (ref 22–32)
Calcium: 9.2 mg/dL (ref 8.9–10.3)
Chloride: 99 mmol/L (ref 98–111)
Creatinine, Ser: 1.58 mg/dL — ABNORMAL HIGH (ref 0.61–1.24)
GFR, Estimated: 57 mL/min — ABNORMAL LOW (ref >60)
Glucose, Bld: 330 mg/dL — ABNORMAL HIGH (ref 70–99)
Potassium: 4.5 mmol/L (ref 3.5–5.1)
Sodium: 134 mmol/L — ABNORMAL LOW (ref 135–145)
Total Bilirubin: 0.6 mg/dL (ref 0.0–1.2)
Total Protein: 6 g/dL — ABNORMAL LOW (ref 6.5–8.1)

## 2023-09-18 LAB — CBC WITH DIFFERENTIAL/PLATELET
Abs Immature Granulocytes: 0.01 10*3/uL (ref 0.00–0.07)
Basophils Absolute: 0.1 10*3/uL (ref 0.0–0.1)
Basophils Relative: 1 %
Eosinophils Absolute: 0.2 10*3/uL (ref 0.0–0.5)
Eosinophils Relative: 3 %
HCT: 40.7 % (ref 39.0–52.0)
Hemoglobin: 13.8 g/dL (ref 13.0–17.0)
Immature Granulocytes: 0 %
Lymphocytes Relative: 49 %
Lymphs Abs: 2.3 10*3/uL (ref 0.7–4.0)
MCH: 28.6 pg (ref 26.0–34.0)
MCHC: 33.9 g/dL (ref 30.0–36.0)
MCV: 84.3 fL (ref 80.0–100.0)
Monocytes Absolute: 0.5 10*3/uL (ref 0.1–1.0)
Monocytes Relative: 9 %
Neutro Abs: 1.8 10*3/uL (ref 1.7–7.7)
Neutrophils Relative %: 38 %
Platelets: 232 10*3/uL (ref 150–400)
RBC: 4.83 MIL/uL (ref 4.22–5.81)
RDW: 12.8 % (ref 11.5–15.5)
WBC: 4.8 10*3/uL (ref 4.0–10.5)
nRBC: 0 % (ref 0.0–0.2)

## 2023-09-18 LAB — URINALYSIS, W/ REFLEX TO CULTURE (INFECTION SUSPECTED)
Bacteria, UA: NONE SEEN
Bilirubin Urine: NEGATIVE
Glucose, UA: >=500 mg/dL — AB
Hgb urine dipstick: NEGATIVE
Ketones, ur: NEGATIVE mg/dL
Leukocytes,Ua: NEGATIVE
Nitrite: NEGATIVE
Protein, ur: NEGATIVE mg/dL
Specific Gravity, Urine: 1.026 (ref 1.005–1.030)
pH: 5 (ref 5.0–8.0)

## 2023-09-18 LAB — URINALYSIS, ROUTINE W REFLEX MICROSCOPIC
Bacteria, UA: NONE SEEN
Bilirubin Urine: NEGATIVE
Glucose, UA: >=500 mg/dL — AB
Hgb urine dipstick: NEGATIVE
Ketones, ur: NEGATIVE mg/dL
Leukocytes,Ua: NEGATIVE
Nitrite: NEGATIVE
Protein, ur: NEGATIVE mg/dL
Specific Gravity, Urine: 1.016 (ref 1.005–1.030)
pH: 7 (ref 5.0–8.0)

## 2023-09-18 LAB — BASIC METABOLIC PANEL
Anion gap: 11 (ref 5–15)
BUN: 22 mg/dL — ABNORMAL HIGH (ref 6–20)
CO2: 22 mmol/L (ref 22–32)
Calcium: 9.7 mg/dL (ref 8.9–10.3)
Chloride: 95 mmol/L — ABNORMAL LOW (ref 98–111)
Creatinine, Ser: 1.33 mg/dL — ABNORMAL HIGH (ref 0.61–1.24)
GFR, Estimated: >60 mL/min (ref >60)
Glucose, Bld: 605 mg/dL (ref 70–99)
Potassium: 5 mmol/L (ref 3.5–5.1)
Sodium: 128 mmol/L — ABNORMAL LOW (ref 135–145)

## 2023-09-18 LAB — CBG MONITORING, ED
Glucose-Capillary: 421 mg/dL — ABNORMAL HIGH (ref 70–99)
Glucose-Capillary: 370 mg/dL — ABNORMAL HIGH (ref 70–99)

## 2023-09-18 LAB — ETHANOL: Alcohol, Ethyl (B): <10 mg/dL (ref <10)

## 2023-09-18 LAB — TROPONIN I (HIGH SENSITIVITY): Troponin I (High Sensitivity): 7 ng/L (ref <18)

## 2023-09-18 MED ORDER — SODIUM CHLORIDE 0.9 % IV BOLUS
1000.0000 mL | Freq: Once | INTRAVENOUS | Status: AC
  Administered 2023-09-18: 1000 mL via INTRAVENOUS

## 2023-09-18 MED ORDER — INSULIN ASPART 100 UNIT/ML IJ SOLN
20.0000 [IU] | Freq: Once | INTRAMUSCULAR | Status: AC
  Administered 2023-09-18: 20 [IU] via SUBCUTANEOUS
  Filled 2023-09-18: qty 0.2

## 2023-09-18 MED ORDER — SODIUM CHLORIDE 0.9 % IV BOLUS
2000.0000 mL | Freq: Once | INTRAVENOUS | Status: AC
  Administered 2023-09-18: 2000 mL via INTRAVENOUS

## 2023-09-18 NOTE — Discharge Instructions (Signed)
Call your physician tomorrow.  Return here for concerning changes in your condition.

## 2023-09-18 NOTE — Discharge Instructions (Signed)
Please take your insulin as directed. Your urine appears to be completely normal.  Control your blood sugars you will have so much urination.   Turn for any life-threatening emergencies

## 2023-09-18 NOTE — ED Triage Notes (Signed)
Pt brought in by EMS for centralized CP ongoing for entire day per patient. Non-radiating. Associated SOB, weakness, dizziness, fatigue. PT awake and alert. PT reports symptoms feel same as previous 2 MI. EMS reports no ST elevation in EKG. NAD noted on arrival. Pt also reports recent new suicidal thoughts in past couple of days and states his plan would be to taken extra meds if he were to attempt suicide. No previous hx of SI. Pt denies having acted on thoughts.

## 2023-09-18 NOTE — ED Provider Notes (Signed)
[place] EMERGENCY DEPARTMENT AT [facility] Provider Note   CSN: [id number] Arrival date & time: 09/18/23  1903     History  Chief Complaint  Patient presents with   Chest Pain   Suicidal    [name] [name] is a 38 y.o. male.  HPI Presents concern of chest pain.  Patient notes multiple medical problems including insulin-dependent diabetes, prior MI.  He states that in spite of taking his medication regularly he continues to have hyperglycemia, and today developed chest pain across the sternum.  No dyspnea, no syncope, no vomiting, no nausea. Patient does not initially volunteer that this is his second visit in 2 days, third and 3, nor 30th visit in the past 6 months.    Home Medications Prior to Admission medications   Medication Sig Start Date End Date Taking? Authorizing Provider  albuterol (VENTOLIN HFA) 108 (90 Base) MCG/ACT inhaler Inhale 2 puffs into the lungs every 6 (six) hours as needed for wheezing or shortness of breath. 07/21/23   Pokhrel, Rebekah Chesterfield, MD  blood glucose meter kit and supplies KIT Use to check blood sugar three times daily 09/17/23   Solon Augusta S, PA  Glucose Blood (BLOOD GLUCOSE TEST STRIPS) STRP Use test strip to check blood sugar three times daily 09/17/23   Solon Augusta S, PA  insulin glargine (LANTUS) 100 unit/mL SOPN Inject 50 Units into the skin at bedtime. 09/17/23 09/16/24  Gailen Shelter, PA  insulin lispro (HUMALOG) 100 UNIT/ML KwikPen Inject 8 Units into the skin 3 (three) times daily with meals. Only take if eating a meal AND Blood Glucose (BG) is 80 or higher. 09/17/23   Gailen Shelter, PA  Insulin Pen Needle (PEN NEEDLES) 31G X 5 MM MISC Use  three times daily 09/17/23   Gailen Shelter, [place]  Lancet Device MISC Use 3 (three) times daily. 09/17/23   Gailen Shelter, PA  Lancets MISC Use to check blood sugar three times daily 08/26/23   Danford, Earl Lites, MD  lidocaine (LIDODERM) 5 % Place 1 patch onto the skin daily. Remove &  Discard patch within 12 hours or as directed by MD 09/10/23   Kommor, Wyn Forster, MD  lisinopril (ZESTRIL) 20 MG tablet Take 1 tablet (20 mg total) by mouth daily. Patient not taking: Reported on 07/19/2023 05/14/23 07/03/23  Marinda Elk, MD  naproxen (NAPROSYN) 375 MG tablet Take 1 tablet (375 mg total) by mouth 2 (two) times daily. 09/10/23   Kommor, Wyn Forster, MD      Allergies    Patient has no known allergies.    Review of Systems   Review of Systems  Physical Exam Updated Vital Signs BP (!) 156/74 (BP Location: Left Arm)   Pulse (!) 117   Temp 97.8 F (36.6 C) (Oral)   Resp (!) 6   Ht 5\' 11"  (1.803 m)   Wt 108.9 kg   SpO2 100%   BMI 33.47 kg/m  Physical Exam Vitals and nursing note reviewed.  Constitutional:      General: He is not in acute distress.    Comments: Disheveled adult male in no distress, awake, alert, speaking clearly  HENT:     Head: Normocephalic and atraumatic.  Eyes:     Conjunctiva/sclera: Conjunctivae normal.  Cardiovascular:     Rate and Rhythm: Normal rate and regular rhythm.  Pulmonary:     Effort: Pulmonary effort is normal. No respiratory distress.     Breath sounds: No stridor.  Abdominal:  General: There is no distension.  Skin:    General: Skin is warm and dry.  Neurological:     Mental Status: He is alert and oriented to person, place, and time.     ED Results / Procedures / Treatments   Labs (all labs ordered are listed, but only abnormal results are displayed) Labs Reviewed  COMPREHENSIVE METABOLIC PANEL - Abnormal; Notable for the following components:      Result Value   Sodium 134 (*)    CO2 21 (*)    Glucose, Bld 330 (*)    BUN 22 (*)    Creatinine, Ser 1.58 (*)    Total Protein 6.0 (*)    Albumin 3.2 (*)    GFR, Estimated 57 (*)    All other components within normal limits  URINALYSIS, W/ REFLEX TO CULTURE (INFECTION SUSPECTED) - Abnormal; Notable for the following components:   Glucose, UA >=500 (*)    All  other components within normal limits  ETHANOL  CBC WITH DIFFERENTIAL/PLATELET  TROPONIN I (HIGH SENSITIVITY)    EKG EKG Interpretation Date/Time:  Thursday September 18 2023 19:12:40 EST Ventricular Rate:  110 PR Interval:  124 QRS Duration:  100 QT Interval:  326 QTC Calculation: 441 R Axis:   -31  Text Interpretation: Sinus tachycardia Incomplete RBBB and LAFB RSR' in V1 or V2, right VCD or RVH Confirmed by Gerhard Munch [phone]) on 09/18/2023 8:28:56 PM  Radiology No results found.  Procedures Procedures    Medications Ordered in ED Medications  sodium chloride 0.9 % bolus 1,000 mL (0 mLs Intravenous Stopped 09/18/23 2153)    ED Course/ Medical Decision Making/ A&P                                 Medical Decision Making Adult male with prior MI presents with chest pain.  Some reassurance from patient's multiple recent visits, but given his history, ACS considered.  DKA given the absence distress, tachycardia, but with his on cardiac monitor patient has variable heart rate, 95, 110, borderline Pulse ox 100% room air normal  Amount and/or Complexity of Data Reviewed External Data Reviewed: notes.    Details: 27th ED visit the past 6 months Labs: ordered. Decision-making details documented in ED Course. Radiology: ordered and independent interpretation performed. Decision-making details documented in ED Course. ECG/medicine tests: ordered and independent interpretation performed. Decision-making details documented in ED Course.  Risk Prescription drug management. Decision regarding hospitalization. Diagnosis or treatment significantly limited by social determinants of health.   10:43 PM Patient sleeping, awakens easily.  He does have mild tachycardia, pressures normal, mild hyperglycemia, though half of yesterday's value.  No distress, no evidence for ACS, with normal troponin, nonischemic EKG.  Patient discharged to follow-up with primary care.        Final  Clinical Impression(s) / ED Diagnoses Final diagnoses:  Atypical chest pain  Hyperglycemia    Rx / DC Orders ED Discharge Orders     None         Gerhard Munch, MD 09/18/23 2244

## 2023-09-25 ENCOUNTER — Other Ambulatory Visit: Payer: Self-pay

## 2023-09-25 ENCOUNTER — Encounter (HOSPITAL_COMMUNITY): Payer: Self-pay

## 2023-09-25 ENCOUNTER — Emergency Department (HOSPITAL_COMMUNITY): Payer: Medicaid Other

## 2023-09-25 ENCOUNTER — Emergency Department (HOSPITAL_COMMUNITY)
Admission: EM | Admit: 2023-09-25 | Discharge: 2023-09-26 | Disposition: A | Discharge status: Home / Self Care | Payer: Medicaid Other | Attending: Emergency Medicine | Admitting: Emergency Medicine

## 2023-09-25 DIAGNOSIS — Z794 Long term (current) use of insulin: Secondary | ICD-10-CM | POA: Diagnosis not present

## 2023-09-25 DIAGNOSIS — Z79899 Other long term (current) drug therapy: Secondary | ICD-10-CM | POA: Diagnosis not present

## 2023-09-25 DIAGNOSIS — R0789 Other chest pain: Secondary | ICD-10-CM | POA: Diagnosis not present

## 2023-09-25 DIAGNOSIS — R0602 Shortness of breath: Secondary | ICD-10-CM | POA: Insufficient documentation

## 2023-09-25 DIAGNOSIS — R5383 Other fatigue: Secondary | ICD-10-CM | POA: Diagnosis not present

## 2023-09-25 DIAGNOSIS — E1065 Type 1 diabetes mellitus with hyperglycemia: Secondary | ICD-10-CM | POA: Insufficient documentation

## 2023-09-25 DIAGNOSIS — R079 Chest pain, unspecified: Secondary | ICD-10-CM | POA: Diagnosis not present

## 2023-09-25 DIAGNOSIS — R739 Hyperglycemia, unspecified: Secondary | ICD-10-CM

## 2023-09-25 DIAGNOSIS — I10 Essential (primary) hypertension: Secondary | ICD-10-CM | POA: Insufficient documentation

## 2023-09-25 DIAGNOSIS — R000 Tachycardia, unspecified: Secondary | ICD-10-CM | POA: Diagnosis not present

## 2023-09-25 DIAGNOSIS — R252 Cramp and spasm: Secondary | ICD-10-CM

## 2023-09-25 DIAGNOSIS — M791 Myalgia, unspecified site: Secondary | ICD-10-CM | POA: Insufficient documentation

## 2023-09-25 DIAGNOSIS — M79606 Pain in leg, unspecified: Secondary | ICD-10-CM | POA: Diagnosis not present

## 2023-09-25 LAB — CBC
HCT: 44.8 % (ref 39.0–52.0)
Hemoglobin: 15 g/dL (ref 13.0–17.0)
MCH: 28.4 pg (ref 26.0–34.0)
MCHC: 33.5 g/dL (ref 30.0–36.0)
MCV: 84.7 fL (ref 80.0–100.0)
Platelets: 229 10*3/uL (ref 150–400)
RBC: 5.29 MIL/uL (ref 4.22–5.81)
RDW: 12.7 % (ref 11.5–15.5)
WBC: 5 10*3/uL (ref 4.0–10.5)
nRBC: 0 % (ref 0.0–0.2)

## 2023-09-25 NOTE — ED Triage Notes (Signed)
 Pt BIB [facility] c/o CP that started about 30 mins ago. Pt was walking around when it happened. Pt has a hx of a MI. Pt states the pain radiates to his left arm and endorses SHOB.    20g LAC 324 ASA  2 Nitros    89 100 170/95 148/83 361 CBG

## 2023-09-25 NOTE — ED Notes (Signed)
 MSE'd by Dahlia Client, PA. Not WR appropriate at this time

## 2023-09-25 NOTE — ED Notes (Signed)
 Patient transported to X-ray

## 2023-09-25 NOTE — ED Provider Triage Note (Signed)
 Emergency Medicine Provider Triage Evaluation Note  [name] [name] , a 38 y.o. male  was evaluated in triage.  Pt complains of chest pain.  Onset about 3 hours ago.  Reports hx of MI.  Denies use.  Does see a cardiologist.  Review of Systems  Positive: Chest pain Negative: fever  Physical Exam  BP (!) 155/94 (BP Location: Right Arm)   Pulse (!) 103   Temp 98 F (36.7 C) (Oral)   Resp 18   SpO2 97%  Gen:   Awake, no distress   Resp:  Normal effort  MSK:   Moves extremities without difficulty  Other:    Medical Decision Making  Medically screening exam initiated at 11:16 PM.  Appropriate orders placed.  JANZIEL HOCKETT was informed that the remainder of the evaluation will be completed by another provider, this initial triage assessment does not replace that evaluation, and the importance of remaining in the ED until their evaluation is complete.     Roxy Horseman, PA-C 09/25/23 2319

## 2023-09-26 LAB — URINALYSIS, ROUTINE W REFLEX MICROSCOPIC
Bacteria, UA: NONE SEEN
Bilirubin Urine: NEGATIVE
Glucose, UA: >=500 mg/dL — AB
Hgb urine dipstick: NEGATIVE
Ketones, ur: NEGATIVE mg/dL
Leukocytes,Ua: NEGATIVE
Nitrite: NEGATIVE
Protein, ur: NEGATIVE mg/dL
Specific Gravity, Urine: 1.023 (ref 1.005–1.030)
pH: 6 (ref 5.0–8.0)

## 2023-09-26 LAB — I-STAT VENOUS BLOOD GAS, ED
Acid-Base Excess: 5 mmol/L — ABNORMAL HIGH (ref 0.0–2.0)
Bicarbonate: 29.1 mmol/L — ABNORMAL HIGH (ref 20.0–28.0)
Calcium, Ion: 0.93 mmol/L — ABNORMAL LOW (ref 1.15–1.40)
HCT: 49 % (ref 39.0–52.0)
Hemoglobin: 16.7 g/dL (ref 13.0–17.0)
O2 Saturation: 94 %
Potassium: 7.9 mmol/L (ref 3.5–5.1)
Sodium: 123 mmol/L — ABNORMAL LOW (ref 135–145)
TCO2: 30 mmol/L (ref 22–32)
pCO2, Ven: 39.9 mm[Hg] — ABNORMAL LOW (ref 44–60)
pH, Ven: 7.472 — ABNORMAL HIGH (ref 7.25–7.43)
pO2, Ven: 68 mm[Hg] — ABNORMAL HIGH (ref 32–45)

## 2023-09-26 LAB — TROPONIN I (HIGH SENSITIVITY)
Troponin I (High Sensitivity): 6 ng/L (ref <18)
Troponin I (High Sensitivity): 6 ng/L (ref <18)

## 2023-09-26 LAB — CBG MONITORING, ED
Glucose-Capillary: >600 mg/dL (ref 70–99)
Glucose-Capillary: >600 mg/dL (ref 70–99)
Glucose-Capillary: 209 mg/dL — ABNORMAL HIGH (ref 70–99)
Glucose-Capillary: 223 mg/dL — ABNORMAL HIGH (ref 70–99)
Glucose-Capillary: 113 mg/dL — ABNORMAL HIGH (ref 70–99)

## 2023-09-26 LAB — BASIC METABOLIC PANEL
Anion gap: 13 (ref 5–15)
BUN: 19 mg/dL (ref 6–20)
CO2: 24 mmol/L (ref 22–32)
Calcium: 8.9 mg/dL (ref 8.9–10.3)
Chloride: 87 mmol/L — ABNORMAL LOW (ref 98–111)
Creatinine, Ser: 1.59 mg/dL — ABNORMAL HIGH (ref 0.61–1.24)
GFR, Estimated: 57 mL/min — ABNORMAL LOW (ref >60)
Glucose, Bld: 833 mg/dL (ref 70–99)
Potassium: 4.9 mmol/L (ref 3.5–5.1)
Sodium: 124 mmol/L — ABNORMAL LOW (ref 135–145)

## 2023-09-26 LAB — OSMOLALITY: Osmolality: 313 mosm/kg — ABNORMAL HIGH (ref 275–295)

## 2023-09-26 MED ORDER — DEXTROSE IN LACTATED RINGERS 5 % IV SOLN: INTRAVENOUS | Status: DC

## 2023-09-26 MED ORDER — LACTATED RINGERS IV BOLUS
20.0000 mL/kg | Freq: Once | INTRAVENOUS | Status: AC
  Administered 2023-09-26: 2178 mL via INTRAVENOUS

## 2023-09-26 MED ORDER — METHOCARBAMOL 500 MG PO TABS
500.0000 mg | ORAL_TABLET | Freq: Once | ORAL | Status: AC
  Administered 2023-09-26: 500 mg via ORAL
  Filled 2023-09-26: qty 1

## 2023-09-26 MED ORDER — POTASSIUM CHLORIDE 10 MEQ/100ML IV SOLN
10.0000 meq | INTRAVENOUS | Status: AC
  Administered 2023-09-26 (×2): 10 meq via INTRAVENOUS
  Filled 2023-09-26 (×2): qty 100

## 2023-09-26 MED ORDER — LACTATED RINGERS IV SOLN: INTRAVENOUS | Status: DC

## 2023-09-26 MED ORDER — INSULIN REGULAR(HUMAN) IN NACL 100-0.9 UT/100ML-% IV SOLN
INTRAVENOUS | Status: DC
  Administered 2023-09-26: 15 [IU]/h via INTRAVENOUS
  Filled 2023-09-26: qty 100

## 2023-09-26 MED ORDER — DEXTROSE 50 % IV SOLN: 0.0000 mL | INTRAVENOUS | Status: DC | PRN

## 2023-09-26 NOTE — ED Provider Notes (Signed)
 [place] EMERGENCY DEPARTMENT AT [facility] Provider Note   CSN: [id number] Arrival date & time: 09/25/23  2215     History  Chief Complaint  Patient presents with   Chest Pain    [name] [name] is a 38 y.o. male.  Patient presents to the emergency department via EMS complaining of chest pain that began at approximately 945 last night.  He states he was walking when it began.  He reports history of previous MI.  He endorses pain radiating to his left arm with some mild shortness of breath.  EMS administered 324 mg of aspirin and 2 nitroglycerin doses.  During the triage process the patient was noted to be significantly hyperglycemic with a glucose of 833.  He does endorse increased urination and thirst.  During my initial assessment the patient is very lethargic but able to be aroused and able to answer basic questions.  Patient also complains of some mild leg cramping.  Past medical history significant for hypertension, type I DM, substance abuse, HHS, reported MI although I am unable to find any evidence of this in chart review   Chest Pain      Home Medications Prior to Admission medications   Medication Sig Start Date End Date Taking? Authorizing Provider  albuterol (VENTOLIN HFA) 108 (90 Base) MCG/ACT inhaler Inhale 2 puffs into the lungs every 6 (six) hours as needed for wheezing or shortness of breath. 07/21/23   Pokhrel, Rebekah Chesterfield, MD  blood glucose meter kit and supplies KIT Use to check blood sugar three times daily 09/17/23   Solon Augusta S, PA  Glucose Blood (BLOOD GLUCOSE TEST STRIPS) STRP Use test strip to check blood sugar three times daily 09/17/23   Solon Augusta S, PA  insulin glargine (LANTUS) 100 unit/mL SOPN Inject 50 Units into the skin at bedtime. 09/17/23 09/16/24  Gailen Shelter, PA  insulin lispro (HUMALOG) 100 UNIT/ML KwikPen Inject 8 Units into the skin 3 (three) times daily with meals. Only take if eating a meal AND Blood Glucose (BG) is 80 or  higher. 09/17/23   Gailen Shelter, PA  Insulin Pen Needle (PEN NEEDLES) 31G X 5 MM MISC Use  three times daily 09/17/23   Gailen Shelter, [place]  Lancet Device MISC Use 3 (three) times daily. 09/17/23   Gailen Shelter, PA  Lancets MISC Use to check blood sugar three times daily 08/26/23   Danford, Earl Lites, MD  lidocaine (LIDODERM) 5 % Place 1 patch onto the skin daily. Remove & Discard patch within 12 hours or as directed by MD 09/10/23   Kommor, Wyn Forster, MD  lisinopril (ZESTRIL) 20 MG tablet Take 1 tablet (20 mg total) by mouth daily. Patient not taking: Reported on 07/19/2023 05/14/23 07/03/23  Marinda Elk, MD  naproxen (NAPROSYN) 375 MG tablet Take 1 tablet (375 mg total) by mouth 2 (two) times daily. 09/10/23   Kommor, Wyn Forster, MD      Allergies    Patient has no known allergies.    Review of Systems   Review of Systems  Cardiovascular:  Positive for chest pain.    Physical Exam Updated Vital Signs BP (!) 161/115   Pulse (!) 101   Temp 98.2 F (36.8 C)   Resp 16   SpO2 100%  Physical Exam Vitals and nursing note reviewed.  Constitutional:      General: He is not in acute distress.    Appearance: He is well-developed.  HENT:  Head: Normocephalic and atraumatic.  Eyes:     Conjunctiva/sclera: Conjunctivae normal.  Cardiovascular:     Rate and Rhythm: Normal rate and regular rhythm.     Heart sounds: No murmur heard. Pulmonary:     Effort: Pulmonary effort is normal. No respiratory distress.     Breath sounds: Normal breath sounds.  Chest:     Chest wall: No tenderness.  Abdominal:     Palpations: Abdomen is soft.     Tenderness: There is no abdominal tenderness.  Musculoskeletal:        General: No swelling.     Cervical back: Neck supple.  Skin:    General: Skin is warm and dry.     Capillary Refill: Capillary refill takes less than 2 seconds.  Neurological:     Mental Status: He is alert.  Psychiatric:        Mood and Affect: Mood normal.      ED Results / Procedures / Treatments   Labs (all labs ordered are listed, but only abnormal results are displayed) Labs Reviewed  BASIC METABOLIC PANEL - Abnormal; Notable for the following components:      Result Value   Sodium 124 (*)    Chloride 87 (*)    Glucose, Bld 833 (*)    Creatinine, Ser 1.59 (*)    GFR, Estimated 57 (*)    All other components within normal limits  OSMOLALITY - Abnormal; Notable for the following components:   Osmolality 313 (*)    All other components within normal limits  URINALYSIS, ROUTINE W REFLEX MICROSCOPIC - Abnormal; Notable for the following components:   Color, Urine COLORLESS (*)    Glucose, UA >=500 (*)    All other components within normal limits  I-STAT VENOUS BLOOD GAS, ED - Abnormal; Notable for the following components:   pH, Ven 7.472 (*)    pCO2, Ven 39.9 (*)    pO2, Ven 68 (*)    Bicarbonate 29.1 (*)    Acid-Base Excess 5.0 (*)    Sodium 123 (*)    Potassium 7.9 (*)    Calcium, Ion 0.93 (*)    All other components within normal limits  CBG MONITORING, ED - Abnormal; Notable for the following components:   Glucose-Capillary >600 (*)    All other components within normal limits  CBG MONITORING, ED - Abnormal; Notable for the following components:   Glucose-Capillary >600 (*)    All other components within normal limits  CBG MONITORING, ED - Abnormal; Notable for the following components:   Glucose-Capillary 223 (*)    All other components within normal limits  CBG MONITORING, ED - Abnormal; Notable for the following components:   Glucose-Capillary 209 (*)    All other components within normal limits  CBG MONITORING, ED - Abnormal; Notable for the following components:   Glucose-Capillary 113 (*)    All other components within normal limits  CBC  TROPONIN I (HIGH SENSITIVITY)  TROPONIN I (HIGH SENSITIVITY)    EKG None  Radiology DG Chest 2 View Result Date: 09/26/2023 CLINICAL DATA:  Chest pain x1 day. EXAM:  CHEST - 2 VIEW COMPARISON:  September 10, 2023 FINDINGS: The heart size and mediastinal contours are within normal limits. Both lungs are clear. The visualized skeletal structures are unremarkable. IMPRESSION: No active cardiopulmonary disease. Electronically Signed   By: Aram Candela M.D.   On: 09/26/2023 00:29    Procedures Procedures    Medications Ordered in ED Medications  insulin regular,  human (MYXREDLIN) 100 units/ 100 mL infusion (0 Units/hr Intravenous Stopped 09/26/23 0622)  lactated ringers infusion (has no administration in time range)  dextrose 5 % in lactated ringers infusion (0 mLs Intravenous Stopped 09/26/23 0622)  dextrose 50 % solution 0-50 mL (has no administration in time range)  lactated ringers bolus 2,178 mL (0 mLs Intravenous Stopped 09/26/23 0552)  potassium chloride 10 mEq in 100 mL IVPB (0 mEq Intravenous Stopped 09/26/23 0622)  methocarbamol (ROBAXIN) tablet 500 mg (500 mg Oral Given 09/26/23 0533)    ED Course/ Medical Decision Making/ A&P                                 Medical Decision Making Amount and/or Complexity of Data Reviewed Labs: ordered.  Risk Prescription drug management.   This patient presents to the ED for concern of chest pain, this involves an extensive number of treatment options, and is a complaint that carries with it a high risk of complications and morbidity.  The differential diagnosis includes ACS, anxiety, pneumonia, PE, others.  Patient is also hyperglycemic with differentials including HHS, DKA, hyperglycemia   Co morbidities that complicate the patient evaluation  Type I DM   Additional history obtained:  Additional history obtained from EMS External records from outside source obtained and reviewed including outside chart review showing outside emergency department visits including 1 for chest pain with no evidence of MI   Lab Tests:  I Ordered, and personally interpreted labs.  The pertinent results include:  Glucose 833, pH 7.472, no anion gap, corrected sodium of 136-142 depending on formula, initial and repeat troponin 6   Imaging Studies ordered:  I ordered imaging studies including chest x-ray I independently visualized and interpreted imaging which showed no active disease I agree with the radiologist interpretation   Cardiac Monitoring: / EKG:  The patient was maintained on a cardiac monitor.  I personally viewed and interpreted the cardiac monitored which showed an underlying rhythm of: Sinus tachycardia   Problem List / ED Course / Critical interventions / Medication management   I ordered medication including insulin and LR bolus for hyperglycemia, methocarbamol for muscle spasms Reevaluation of the patient after these medicines showed that the patient improved I have reviewed the patients home medicines and have made adjustments as needed   Social Determinants of Health:  Patient is a daily tobacco smoker, has Medicaid for his primary health insurance type   Test / Admission - Considered:  Patient's glucose has been corrected with the most recent CBG being 113.  Patient states he took his insulin as prescribed, unclear as to why his glucose was this high.  No sign of infection.  Troponin of 6 with no delta, nonischemic EKG.  No sign of ACS.  O2 saturation at 100%.  Normal chest x-ray.  No sign of pulmonary embolism.  At this time will recommend outpatient follow-up for patient's chest pain.  Patient states he has a cardiologist.  Patient does have some muscle spasms in the legs.  Treated here in the emergency department with Robaxin.  Plan to discharge home in stable condition with recommendation for follow-up with primary care and cardiology.         Final Clinical Impression(s) / ED Diagnoses Final diagnoses:  Hyperglycemia  Nonspecific chest pain  Muscle cramping    Rx / DC Orders ED Discharge Orders     None  Pamala Duffel 09/26/23  6045    Marily Memos, MD 09/27/23 [phone]

## 2023-09-26 NOTE — Discharge Instructions (Signed)
 Your chest pain workup tonight was reassuring.  I recommend follow-up with your cardiologist and primary care provider.  You are hypoglycemic and were treated with insulin here at the emergency department.  Please follow-up with your primary care provider for further evaluation of your diabetes.  If you develop any life-threatening symptoms please return to the emergency department.

## 2023-09-28 ENCOUNTER — Observation Stay (HOSPITAL_COMMUNITY): Admission: EM | Admit: 2023-09-28 | Discharge: 2023-09-30 | Attending: Internal Medicine | Admitting: Internal Medicine

## 2023-09-28 DIAGNOSIS — E11649 Type 2 diabetes mellitus with hypoglycemia without coma: Secondary | ICD-10-CM | POA: Diagnosis not present

## 2023-09-28 DIAGNOSIS — E1159 Type 2 diabetes mellitus with other circulatory complications: Secondary | ICD-10-CM | POA: Insufficient documentation

## 2023-09-28 DIAGNOSIS — Z794 Long term (current) use of insulin: Secondary | ICD-10-CM | POA: Insufficient documentation

## 2023-09-28 DIAGNOSIS — I152 Hypertension secondary to endocrine disorders: Secondary | ICD-10-CM | POA: Diagnosis present

## 2023-09-28 DIAGNOSIS — E161 Other hypoglycemia: Secondary | ICD-10-CM | POA: Diagnosis not present

## 2023-09-28 DIAGNOSIS — Z79899 Other long term (current) drug therapy: Secondary | ICD-10-CM | POA: Diagnosis not present

## 2023-09-28 DIAGNOSIS — E785 Hyperlipidemia, unspecified: Secondary | ICD-10-CM | POA: Insufficient documentation

## 2023-09-28 DIAGNOSIS — R569 Unspecified convulsions: Secondary | ICD-10-CM | POA: Diagnosis not present

## 2023-09-28 DIAGNOSIS — I10 Essential (primary) hypertension: Secondary | ICD-10-CM | POA: Insufficient documentation

## 2023-09-28 DIAGNOSIS — R55 Syncope and collapse: Secondary | ICD-10-CM | POA: Diagnosis not present

## 2023-09-28 DIAGNOSIS — E162 Hypoglycemia, unspecified: Principal | ICD-10-CM

## 2023-09-28 DIAGNOSIS — R000 Tachycardia, unspecified: Secondary | ICD-10-CM | POA: Diagnosis not present

## 2023-09-28 LAB — CBC WITH DIFFERENTIAL/PLATELET
Abs Immature Granulocytes: 0.02 10*3/uL (ref 0.00–0.07)
Basophils Absolute: 0 10*3/uL (ref 0.0–0.1)
Basophils Relative: 0 %
Eosinophils Absolute: 0.1 10*3/uL (ref 0.0–0.5)
Eosinophils Relative: 1 %
HCT: 52.4 % — ABNORMAL HIGH (ref 39.0–52.0)
Hemoglobin: 16.9 g/dL (ref 13.0–17.0)
Immature Granulocytes: 0 %
Lymphocytes Relative: 31 %
Lymphs Abs: 1.9 10*3/uL (ref 0.7–4.0)
MCH: 28.4 pg (ref 26.0–34.0)
MCHC: 32.3 g/dL (ref 30.0–36.0)
MCV: 87.9 fL (ref 80.0–100.0)
Monocytes Absolute: 0.5 10*3/uL (ref 0.1–1.0)
Monocytes Relative: 8 %
Neutro Abs: 3.5 10*3/uL (ref 1.7–7.7)
Neutrophils Relative %: 60 %
Platelets: 237 10*3/uL (ref 150–400)
RBC: 5.96 MIL/uL — ABNORMAL HIGH (ref 4.22–5.81)
RDW: 13 % (ref 11.5–15.5)
WBC: 6 10*3/uL (ref 4.0–10.5)
nRBC: 0 % (ref 0.0–0.2)

## 2023-09-28 LAB — COMPREHENSIVE METABOLIC PANEL
ALT: 17 U/L (ref 0–44)
AST: 25 U/L (ref 15–41)
Albumin: 3.5 g/dL (ref 3.5–5.0)
Alkaline Phosphatase: 65 U/L (ref 38–126)
Anion gap: 8 (ref 5–15)
BUN: 17 mg/dL (ref 6–20)
CO2: 25 mmol/L (ref 22–32)
Calcium: 9.3 mg/dL (ref 8.9–10.3)
Chloride: 103 mmol/L (ref 98–111)
Creatinine, Ser: 1.23 mg/dL (ref 0.61–1.24)
GFR, Estimated: >60 mL/min (ref >60)
Glucose, Bld: 115 mg/dL — ABNORMAL HIGH (ref 70–99)
Potassium: 3.9 mmol/L (ref 3.5–5.1)
Sodium: 136 mmol/L (ref 135–145)
Total Bilirubin: 0.7 mg/dL (ref 0.0–1.2)
Total Protein: 6.9 g/dL (ref 6.5–8.1)

## 2023-09-28 LAB — CBG MONITORING, ED
Glucose-Capillary: 106 mg/dL — ABNORMAL HIGH (ref 70–99)
Glucose-Capillary: 51 mg/dL — ABNORMAL LOW (ref 70–99)
Glucose-Capillary: 55 mg/dL — ABNORMAL LOW (ref 70–99)
Glucose-Capillary: 64 mg/dL — ABNORMAL LOW (ref 70–99)
Glucose-Capillary: 67 mg/dL — ABNORMAL LOW (ref 70–99)
Glucose-Capillary: 92 mg/dL (ref 70–99)

## 2023-09-28 LAB — LIPASE, BLOOD: Lipase: 84 U/L — ABNORMAL HIGH (ref 11–51)

## 2023-09-28 MED ORDER — DEXTROSE 50 % IV SOLN: 50.0000 mL | Freq: Once | INTRAVENOUS | Status: AC

## 2023-09-28 MED ORDER — DEXTROSE 10 % IV SOLN: INTRAVENOUS | Status: DC

## 2023-09-28 MED ORDER — DEXTROSE 50 % IV SOLN
50.0000 mL | Freq: Once | INTRAVENOUS | Status: AC
  Administered 2023-09-28: 50 mL via INTRAVENOUS
  Filled 2023-09-28: qty 50

## 2023-09-28 MED ORDER — DEXTROSE 50 % IV SOLN
INTRAVENOUS | Status: AC
Start: 2023-09-28 — End: 2023-09-28
  Administered 2023-09-28: 50 mL via INTRAVENOUS
  Filled 2023-09-28: qty 50

## 2023-09-28 NOTE — H&P (Signed)
 History and Physical    [name] [name]:[id number] DOB: 03/14/1986 DOA: 09/28/2023  PCP: [name], NP  Patient coming from: Homeless  I have personally briefly reviewed patient's old medical records in [facility]  Chief Complaint: Hypoglycemia  HPI: [name] [name] is a 38 y.o. male with medical history significant for uncontrolled insulin-dependent T2DM, HTN, substance use with substance-induced mood disorder, homeless who was brought to the ED by EMS after he was found unresponsive at a [employer].  ***  ED Course  Labs/Imaging on admission: I have personally reviewed following labs and imaging studies.  Initial vitals showed BP 139/106, pulse 106, RR 11, temp 97.7 F, SpO2 100% on room air.  Initial CBG 64.  Sodium 136, potassium 3.9, bicarb 25, BUN 17, creatinine 1.23, serum glucose 115, LFTs within normal limits, lipase 84, WBC 6.0, hemoglobin 16.9, platelets 237,000.  Serum ethanol in process.  UA and UDS pending collection.  Patient was given 1 amp of D50 x 2 with transient improvement in but developed recurrent hypoglycemia with repeat CBG 55.  He was subsequently placed on D10 infusion at 50 mL/hour.  The hospitalist service was consulted to admit due to recurrent hypoglycemia.  Review of Systems: All systems reviewed and are negative except as documented in history of present illness above.   Past Medical History:  Diagnosis Date   Diabetes mellitus without complication (HCC)    DKA (diabetic ketoacidosis) (HCC) 05/08/2022   HTN (hypertension) 05/08/2022   Hyperlipidemia    Hyperosmolar hyperglycemic state (HHS) (HCC) 05/08/2022   Hypertension    Hypoglycemia 07/02/2019   Pseudohyponatremia 05/08/2022    Past Surgical History:  Procedure Laterality Date   HAND SURGERY      Social History:  reports that he has been smoking cigarettes. He has never used smokeless tobacco. He reports current drug use. Drugs: Amphetamines and Marijuana. He reports  that he does not drink alcohol.  No Known Allergies  Family History  Family history unknown: Yes     Prior to Admission medications   Medication Sig Start Date End Date Taking? Authorizing Provider  albuterol (VENTOLIN HFA) 108 (90 Base) MCG/ACT inhaler Inhale 2 puffs into the lungs every 6 (six) hours as needed for wheezing or shortness of breath. 07/21/23   Pokhrel, Rebekah Chesterfield, MD  blood glucose meter kit and supplies KIT Use to check blood sugar three times daily 09/17/23   Solon Augusta S, PA  Glucose Blood (BLOOD GLUCOSE TEST STRIPS) STRP Use test strip to check blood sugar three times daily 09/17/23   Solon Augusta S, PA  insulin glargine (LANTUS) 100 unit/mL SOPN Inject 50 Units into the skin at bedtime. 09/17/23 09/16/24  Gailen Shelter, PA  insulin lispro (HUMALOG) 100 UNIT/ML KwikPen Inject 8 Units into the skin 3 (three) times daily with meals. Only take if eating a meal AND Blood Glucose (BG) is 80 or higher. 09/17/23   Gailen Shelter, PA  Insulin Pen Needle (PEN NEEDLES) 31G X 5 MM MISC Use  three times daily 09/17/23   Gailen Shelter, [place]  Lancet Device MISC Use 3 (three) times daily. 09/17/23   Gailen Shelter, PA  Lancets MISC Use to check blood sugar three times daily 08/26/23   Danford, Earl Lites, MD  lidocaine (LIDODERM) 5 % Place 1 patch onto the skin daily. Remove & Discard patch within 12 hours or as directed by MD 09/10/23   Kommor, Wyn Forster, MD  lisinopril (ZESTRIL) 20 MG tablet Take 1  tablet (20 mg total) by mouth daily. Patient not taking: Reported on 07/19/2023 05/14/23 07/03/23  Marinda Elk, MD  naproxen (NAPROSYN) 375 MG tablet Take 1 tablet (375 mg total) by mouth 2 (two) times daily. 09/10/23   Glendora Score, MD    Physical Exam: Vitals:   09/28/23 2130 09/28/23 2145 09/28/23 2200 09/28/23 2215  BP: 116/85 121/86 122/84 (!) 134/98  Pulse: (!) 106 (!) 106    Resp: 18 17 17 12   Temp:      TempSrc:      SpO2: 99% 99%    Weight:      Height:        *** Constitutional: NAD, calm, comfortable Eyes: PERRL, lids and conjunctivae normal ENMT: Mucous membranes are moist. Posterior pharynx clear of any exudate or lesions.Normal dentition.  Neck: normal, supple, no masses. Respiratory: clear to auscultation bilaterally, no wheezing, no crackles. Normal respiratory effort. No accessory muscle use.  Cardiovascular: Regular rate and rhythm, no murmurs / rubs / gallops. No extremity edema. 2+ pedal pulses. Abdomen: no tenderness, no masses palpated. No hepatosplenomegaly. Bowel sounds positive.  Musculoskeletal: no clubbing / cyanosis. No joint deformity upper and lower extremities. Good ROM, no contractures. Normal muscle tone.  Skin: no rashes, lesions, ulcers. No induration Neurologic: CN 2-12 grossly intact. Sensation intact. Strength 5/5 in all 4.  Psychiatric: Normal judgment and insight. Alert and oriented x 3. Normal mood.   EKG: Personally reviewed. Sinus rhythm, rate 109, QTc 522.  QTc more prolonged when compared to prior.  Assessment/Plan Active Problems:   * No active hospital problems. *   *** No notes on file *** Assessment and Plan: Hypoglycemia in setting of uncontrolled insulin-dependent type 2 diabetes: ***  Hypertension: ***  History of substance use: ***   DVT prophylaxis: ***  Code Status: ***  Family Communication: ***  Disposition Plan: ***  Consults called: ***  Severity of Illness: {Observation/Inpatient:21159}  Darreld Mclean MD [facility]  If 7PM-7AM, please contact night-coverage [email or web address]  09/28/2023, 11:35 PM

## 2023-09-28 NOTE — ED Notes (Signed)
 RN made aware of CBG. Per RN, pt provided with orange juice cup and sausage, egg and cheese biscuit.

## 2023-09-28 NOTE — ED Provider Notes (Signed)
  EMERGENCY DEPARTMENT AT [facility] Provider Note   CSN: [id number] Arrival date & time: 09/28/23  1922     History  Chief Complaint  Patient presents with   Hypoglycemia    [name] [name] is a 38 y.o. male.  The history is provided by the patient, the EMS personnel and medical records. No language interpreter was used.  Hypoglycemia    38 year old male history of hypertension, diabetes, hyperlipidemia, substance abuse disorder presenting with concerns of low blood sugar.  Patient was brought here via EMS.  Patient was found passing out at [employer].  When EMS arrived, he was reported to have a CBG of 79.  Patient was giving D10 and soft food with improvement of his blood sugar.  Patient brought here for further evaluation.  He mention he has not eaten anything since yesterday morning due to lack of food.  He did take his insulin 7 hours ago.  He denies any new changes in medication he denies any recent alcohol or drug use denies any significant pain no headache no chest pain no nausea vomiting or diarrhea.  He mention he has had low blood sugar like this in the past.  Home Medications Prior to Admission medications   Medication Sig Start Date End Date Taking? Authorizing Provider  albuterol (VENTOLIN HFA) 108 (90 Base) MCG/ACT inhaler Inhale 2 puffs into the lungs every 6 (six) hours as needed for wheezing or shortness of breath. 07/21/23   Pokhrel, Rebekah Chesterfield, MD  blood glucose meter kit and supplies KIT Use to check blood sugar three times daily 09/17/23   Solon Augusta S, PA  Glucose Blood (BLOOD GLUCOSE TEST STRIPS) STRP Use test strip to check blood sugar three times daily 09/17/23   Solon Augusta S, PA  insulin glargine (LANTUS) 100 unit/mL SOPN Inject 50 Units into the skin at bedtime. 09/17/23 09/16/24  Gailen Shelter, PA  insulin lispro (HUMALOG) 100 UNIT/ML KwikPen Inject 8 Units into the skin 3 (three) times daily with meals. Only take if eating a meal AND  Blood Glucose (BG) is 80 or higher. 09/17/23   Gailen Shelter, PA  Insulin Pen Needle (PEN NEEDLES) 31G X 5 MM MISC Use  three times daily 09/17/23   Gailen Shelter, [place]  Lancet Device MISC Use 3 (three) times daily. 09/17/23   Gailen Shelter, PA  Lancets MISC Use to check blood sugar three times daily 08/26/23   Danford, Earl Lites, MD  lidocaine (LIDODERM) 5 % Place 1 patch onto the skin daily. Remove & Discard patch within 12 hours or as directed by MD 09/10/23   Kommor, Wyn Forster, MD  lisinopril (ZESTRIL) 20 MG tablet Take 1 tablet (20 mg total) by mouth daily. Patient not taking: Reported on 07/19/2023 05/14/23 07/03/23  Marinda Elk, MD  naproxen (NAPROSYN) 375 MG tablet Take 1 tablet (375 mg total) by mouth 2 (two) times daily. 09/10/23   Kommor, Wyn Forster, MD      Allergies    Patient has no known allergies.    Review of Systems   Review of Systems  All other systems reviewed and are negative.   Physical Exam Updated Vital Signs BP (!) 134/98   Pulse (!) 106   Temp 97.7 F (36.5 C) (Oral)   Resp 12   Ht 5\' 11"  (1.803 m)   Wt 93.4 kg   SpO2 99%   BMI 28.73 kg/m  Physical Exam Vitals and nursing note reviewed.  Constitutional:  General: He is not in acute distress.    Appearance: He is well-developed.  HENT:     Head: Atraumatic.  Eyes:     Conjunctiva/sclera: Conjunctivae normal.  Cardiovascular:     Rate and Rhythm: Normal rate and regular rhythm.     Pulses: Normal pulses.     Heart sounds: Normal heart sounds.  Pulmonary:     Effort: Pulmonary effort is normal.     Breath sounds: Normal breath sounds.  Abdominal:     Palpations: Abdomen is soft.     Tenderness: There is no abdominal tenderness.  Musculoskeletal:        General: Normal range of motion.     Cervical back: Neck supple.  Skin:    Findings: No rash.  Neurological:     Mental Status: He is alert and oriented to person, place, and time.     ED Results / Procedures / Treatments    Labs (all labs ordered are listed, but only abnormal results are displayed) Labs Reviewed  CBC WITH DIFFERENTIAL/PLATELET - Abnormal; Notable for the following components:      Result Value   RBC 5.96 (*)    HCT 52.4 (*)    All other components within normal limits  COMPREHENSIVE METABOLIC PANEL - Abnormal; Notable for the following components:   Glucose, Bld 115 (*)    All other components within normal limits  LIPASE, BLOOD - Abnormal; Notable for the following components:   Lipase 84 (*)    All other components within normal limits  CBG MONITORING, ED - Abnormal; Notable for the following components:   Glucose-Capillary 64 (*)    All other components within normal limits  CBG MONITORING, ED - Abnormal; Notable for the following components:   Glucose-Capillary 67 (*)    All other components within normal limits  CBG MONITORING, ED - Abnormal; Notable for the following components:   Glucose-Capillary 106 (*)    All other components within normal limits  CBG MONITORING, ED - Abnormal; Notable for the following components:   Glucose-Capillary 55 (*)    All other components within normal limits  URINALYSIS, ROUTINE W REFLEX MICROSCOPIC  RAPID URINE DRUG SCREEN, HOSP PERFORMED  ETHANOL  CBG MONITORING, ED    EKG EKG Interpretation Date/Time:  Sunday September 28 2023 19:44:11 EST Ventricular Rate:  109 PR Interval:  127 QRS Duration:  108 QT Interval:  387 QTC Calculation: 522 R Axis:   -46  Text Interpretation: Sinus tachycardia Probable left atrial enlargement Left axis deviation Prolonged QT interval unchanged since earlier in the day Confirmed by Richardean Canal [phone]) on 09/28/2023 11:22:21 PM  Radiology No results found.  Procedures .Critical Care  Performed by: Fayrene Helper, PA-C Authorized by: Fayrene Helper, PA-C   Critical care provider statement:    Critical care time (minutes):  32   Critical care was time spent personally by me on the following activities:   Development of treatment plan with patient or surrogate, discussions with consultants, evaluation of patient's response to treatment, examination of patient, ordering and review of laboratory studies, ordering and review of radiographic studies, ordering and performing treatments and interventions, pulse oximetry, re-evaluation of patient's condition and review of old charts     Medications Ordered in ED Medications  dextrose 10 % infusion ( Intravenous New Bag/Given 09/28/23 2249)  dextrose 50 % solution 50 mL (50 mLs Intravenous Given 09/28/23 2036)  dextrose 50 % solution 50 mL (50 mLs Intravenous Given 09/28/23 2246)  ED Course/ Medical Decision Making/ A&P                                 Medical Decision Making Amount and/or Complexity of Data Reviewed Labs: ordered.  Risk Prescription drug management.   BP (!) 134/98   Pulse (!) 106   Temp 97.7 F (36.5 C) (Oral)   Resp 12   Ht 5\' 11"  (1.803 m)   Wt 93.4 kg   SpO2 99%   BMI 28.73 kg/m   30:64 PM 38 year old male history of hypertension, diabetes, hyperlipidemia, substance abuse disorder presenting with concerns of low blood sugar.  Patient was brought here via EMS.  Patient was found passing out at [employer].  When EMS arrived, he was reported to have a CBG of 79.  Patient was giving D10 and soft food with improvement of his blood sugar.  Patient brought here for further evaluation.  He mention he has not eaten anything since yesterday morning due to lack of food.  He did take his insulin 7 hours ago.  He denies any new changes in medication he denies any recent alcohol or drug use denies any significant pain no headache no chest pain no nausea vomiting or diarrhea.  He mention he has had low blood sugar like this in the past.  On exam, patient is mentating appropriately appears to be in no acute discomfort.  No signs of injury noted on initial exam.  CBG is 64.  Will provide patient with food and will monitor closely.  Labs  ordered.  -Labs ordered, independently viewed and interpreted by me.  Labs remarkable for fluctuating bouts of hypoglycemia improves with D50, then D10 but it is labile.  Pt is mentating appropriately.  -The patient was maintained on a cardiac monitor.  I personally viewed and interpreted the cardiac monitored which showed an underlying rhythm of: sinus tachycardia -Imaging not considered -This patient presents to the ED for concern of AMS, this involves an extensive number of treatment options, and is a complaint that carries with it a high risk of complications and morbidity.  The differential diagnosis includes hypoglycemia, electrolytes imbalance, infection, stroke, drug induced -Co morbidities that complicate the patient evaluation includes DM, HLD, HTN, substance use -Treatment includes D50, D10 drips, PO food -Reevaluation of the patient after these medicines showed that the patient improved -PCP office notes or outside notes reviewed -Discussion with specialist [facility] Dr. Allena Katz who agrees to admit  -Escalation to admission/observation considered: patient is agreeable with admission.   Reoccurring hypoglycemia requiring D10 drip.  Patient is not on any oral anti glycemic medication. Will need admission for obs        Final Clinical Impression(s) / ED Diagnoses Final diagnoses:  Hypoglycemia    Rx / DC Orders ED Discharge Orders     None         Fayrene Helper, PA-C 09/28/23 2335    Charlynne Pander, MD 09/29/23 [phone]

## 2023-09-28 NOTE — ED Triage Notes (Signed)
 Pt found [employer] , unresponsive, snoring RR, diaphoretic, CBG initially was 72, oral glucose given by EMS, did not move CBG, given D10 25 g IV, CBG 198 afterwards. Pt reports gave himself insulin 7 hours ago

## 2023-09-28 NOTE — ED Notes (Signed)
 Pt provided with two orange juice cups in a cup and a sausage, egg and cheese biscuit due to no sandwhches in the department at this time. Per, RN.

## 2023-09-29 ENCOUNTER — Inpatient Hospital Stay (HOSPITAL_COMMUNITY)

## 2023-09-29 ENCOUNTER — Other Ambulatory Visit: Payer: Self-pay

## 2023-09-29 DIAGNOSIS — E11649 Type 2 diabetes mellitus with hypoglycemia without coma: Secondary | ICD-10-CM | POA: Diagnosis not present

## 2023-09-29 DIAGNOSIS — E785 Hyperlipidemia, unspecified: Secondary | ICD-10-CM | POA: Diagnosis not present

## 2023-09-29 DIAGNOSIS — Z79899 Other long term (current) drug therapy: Secondary | ICD-10-CM | POA: Diagnosis not present

## 2023-09-29 DIAGNOSIS — I10 Essential (primary) hypertension: Secondary | ICD-10-CM | POA: Diagnosis not present

## 2023-09-29 DIAGNOSIS — Z794 Long term (current) use of insulin: Secondary | ICD-10-CM | POA: Diagnosis not present

## 2023-09-29 DIAGNOSIS — R000 Tachycardia, unspecified: Secondary | ICD-10-CM | POA: Diagnosis not present

## 2023-09-29 DIAGNOSIS — E1159 Type 2 diabetes mellitus with other circulatory complications: Secondary | ICD-10-CM | POA: Diagnosis not present

## 2023-09-29 LAB — CBG MONITORING, ED
Glucose-Capillary: 135 mg/dL — ABNORMAL HIGH (ref 70–99)
Glucose-Capillary: 139 mg/dL — ABNORMAL HIGH (ref 70–99)
Glucose-Capillary: 206 mg/dL — ABNORMAL HIGH (ref 70–99)
Glucose-Capillary: 256 mg/dL — ABNORMAL HIGH (ref 70–99)
Glucose-Capillary: 47 mg/dL — ABNORMAL LOW (ref 70–99)
Glucose-Capillary: 64 mg/dL — ABNORMAL LOW (ref 70–99)

## 2023-09-29 LAB — BASIC METABOLIC PANEL
Anion gap: 9 (ref 5–15)
BUN: 17 mg/dL (ref 6–20)
CO2: 23 mmol/L (ref 22–32)
Calcium: 9.2 mg/dL (ref 8.9–10.3)
Chloride: 103 mmol/L (ref 98–111)
Creatinine, Ser: 1.04 mg/dL (ref 0.61–1.24)
GFR, Estimated: >60 mL/min (ref >60)
Glucose, Bld: 153 mg/dL — ABNORMAL HIGH (ref 70–99)
Potassium: 3.7 mmol/L (ref 3.5–5.1)
Sodium: 135 mmol/L (ref 135–145)

## 2023-09-29 LAB — URINALYSIS, ROUTINE W REFLEX MICROSCOPIC
Bacteria, UA: NONE SEEN
Bilirubin Urine: NEGATIVE
Glucose, UA: >=500 mg/dL — AB
Hgb urine dipstick: NEGATIVE
Ketones, ur: NEGATIVE mg/dL
Leukocytes,Ua: NEGATIVE
Nitrite: NEGATIVE
Protein, ur: 30 mg/dL — AB
Specific Gravity, Urine: 1.029 (ref 1.005–1.030)
pH: 5 (ref 5.0–8.0)

## 2023-09-29 LAB — RAPID URINE DRUG SCREEN, HOSP PERFORMED
Amphetamines: POSITIVE — AB
Barbiturates: NOT DETECTED
Benzodiazepines: NOT DETECTED
Cocaine: NOT DETECTED
Opiates: NOT DETECTED
Tetrahydrocannabinol: NOT DETECTED

## 2023-09-29 LAB — CBC
HCT: 45.6 % (ref 39.0–52.0)
Hemoglobin: 14.9 g/dL (ref 13.0–17.0)
MCH: 28.2 pg (ref 26.0–34.0)
MCHC: 32.7 g/dL (ref 30.0–36.0)
MCV: 86.2 fL (ref 80.0–100.0)
Platelets: 239 10*3/uL (ref 150–400)
RBC: 5.29 MIL/uL (ref 4.22–5.81)
RDW: 13 % (ref 11.5–15.5)
WBC: 6.1 10*3/uL (ref 4.0–10.5)
nRBC: 0 % (ref 0.0–0.2)

## 2023-09-29 LAB — GLUCOSE, CAPILLARY
Glucose-Capillary: 365 mg/dL — ABNORMAL HIGH (ref 70–99)
Glucose-Capillary: 268 mg/dL — ABNORMAL HIGH (ref 70–99)
Glucose-Capillary: 211 mg/dL — ABNORMAL HIGH (ref 70–99)

## 2023-09-29 LAB — ETHANOL: Alcohol, Ethyl (B): <10 mg/dL (ref <10)

## 2023-09-29 LAB — MAGNESIUM: Magnesium: 2 mg/dL (ref 1.7–2.4)

## 2023-09-29 LAB — D-DIMER, QUANTITATIVE: D-Dimer, Quant: 0.3 ug{FEU}/mL (ref 0.00–0.50)

## 2023-09-29 MED ORDER — DEXTROSE 50 % IV SOLN
INTRAVENOUS | Status: AC
  Administered 2023-09-29: 25 g via INTRAVENOUS
  Filled 2023-09-29: qty 50

## 2023-09-29 MED ORDER — ACETAMINOPHEN 650 MG RE SUPP: 650.0000 mg | Freq: Four times a day (QID) | RECTAL | Status: DC | PRN

## 2023-09-29 MED ORDER — SODIUM CHLORIDE 0.9 % IV SOLN: INTRAVENOUS | Status: DC

## 2023-09-29 MED ORDER — NICOTINE 21 MG/24HR TD PT24
21.0000 mg | MEDICATED_PATCH | Freq: Every day | TRANSDERMAL | Status: DC
  Administered 2023-09-29 – 2023-09-30 (×3): 21 mg via TRANSDERMAL
  Filled 2023-09-29 (×3): qty 1

## 2023-09-29 MED ORDER — INSULIN ASPART 100 UNIT/ML IJ SOLN
0.0000 [IU] | Freq: Three times a day (TID) | INTRAMUSCULAR | Status: DC
  Administered 2023-09-29: 5 [IU] via SUBCUTANEOUS
  Administered 2023-09-30 (×2): 2 [IU] via SUBCUTANEOUS

## 2023-09-29 MED ORDER — SODIUM CHLORIDE 0.9% FLUSH
3.0000 mL | Freq: Two times a day (BID) | INTRAVENOUS | Status: DC
  Administered 2023-09-29 – 2023-09-30 (×3): 3 mL via INTRAVENOUS

## 2023-09-29 MED ORDER — PROCHLORPERAZINE EDISYLATE 10 MG/2ML IJ SOLN: 10.0000 mg | Freq: Four times a day (QID) | INTRAMUSCULAR | Status: DC | PRN

## 2023-09-29 MED ORDER — BISACODYL 5 MG PO TBEC: 5.0000 mg | DELAYED_RELEASE_TABLET | Freq: Every day | ORAL | Status: DC | PRN

## 2023-09-29 MED ORDER — ENOXAPARIN SODIUM 40 MG/0.4ML IJ SOSY
40.0000 mg | PREFILLED_SYRINGE | INTRAMUSCULAR | Status: DC
  Administered 2023-09-29 – 2023-09-30 (×2): 40 mg via SUBCUTANEOUS
  Filled 2023-09-29 (×2): qty 0.4

## 2023-09-29 MED ORDER — ACETAMINOPHEN 325 MG PO TABS: 650.0000 mg | ORAL_TABLET | Freq: Four times a day (QID) | ORAL | Status: DC | PRN

## 2023-09-29 MED ORDER — INSULIN ASPART 100 UNIT/ML IJ SOLN
0.0000 [IU] | Freq: Every day | INTRAMUSCULAR | Status: DC
  Administered 2023-09-29: 2 [IU] via SUBCUTANEOUS

## 2023-09-29 MED ORDER — LISINOPRIL 20 MG PO TABS
20.0000 mg | ORAL_TABLET | Freq: Every day | ORAL | Status: DC
  Administered 2023-09-29 – 2023-09-30 (×2): 20 mg via ORAL
  Filled 2023-09-29: qty 2
  Filled 2023-09-29: qty 1

## 2023-09-29 MED ORDER — SENNOSIDES-DOCUSATE SODIUM 8.6-50 MG PO TABS: 1.0000 | ORAL_TABLET | Freq: Every evening | ORAL | Status: DC | PRN

## 2023-09-29 MED ORDER — DEXTROSE 50 % IV SOLN: 25.0000 g | Freq: Once | INTRAVENOUS | Status: AC

## 2023-09-29 NOTE — ED Notes (Signed)
 Drink   graham crackers and peanut butter given to patient

## 2023-09-29 NOTE — ED Notes (Signed)
 Patient has still not provided a urine sample   he is advised that we still need one

## 2023-09-29 NOTE — Progress Notes (Addendum)
 PROGRESS NOTE  [name] [name] FTD:[id number] DOB: 11-11-1985 DOA: 09/28/2023 PCP: [name], NP  HPI/Recap of past 24 hours: [name] [name] is a 38 y.o. male with medical history significant for uncontrolled insulin-dependent T2DM, HTN, substance use with substance-induced mood disorder, homeless who was brought to the ED by EMS after he was found unresponsive at a [employer].  Noted to be somnolent, diaphoretic, and found to be hypoglycemic, initial CBG was 72.  Of note, patient seen frequently in hospital for issues related to uncontrolled diabetes, usually hyperglycemia.  Patient reported poor oral intake with limited access to food, reports taking his long-acting insulin 2 nights ago (Friday night), and on 3/3 afternoon took 15 units of his short acting Humalog (prescribed 8 units TID with meals) without eating.  In the ED, patient noted to have persistent hypoglycemia despite being on IVF D10 and given several sandwiches, OJ, regular soda.  Vital signs with persistent tachycardia, otherwise unremarkable.  Labs fairly stable. The hospitalist service was consulted to admit due to recurrent hypoglycemia.    Today, patient CBG noted to be improving, currently now hyperglycemic.  Patient denies any new complaints.  Noted to be persistently tachycardic.   Assessment/Plan: Principal Problem:   Hypoglycemia associated with type 2 diabetes mellitus (HCC) Active Problems:   Hypertension associated with diabetes (HCC)   Hypoglycemia in setting of uncontrolled insulin-dependent type 2 diabetes Hyperglycemia Persistent hypoglycemia in setting of insulin use without food intake Now currently hyperglycemia Stop IV D10, switch to NS IVF for now SSI, Accu-Cheks, hypoglycemic protocol  Sinus tachycardia Prolonged Qtc Heart rate in the 110s QTc noted to be 522, repeat EKG pending D-dimer WNL Chest x-ray unremarkable Last echo done in 05/18/2022 showed EF of 60 to 65%, no regional wall  motion abnormality, noted right ventricular pressure overload, repeat pending  Avoid QTc prolongation medications Monitor closely   Hypertension Continue lisinopril   History of substance use Tobacco use Patient reports smoking 0.5 PPD Nicotine patch provided He denies recent alcohol or illicit drug use UDS showing amphetamines     Estimated body mass index is 28.73 kg/m as calculated from the following:   Height as of this encounter: 5\' 11"  (1.803 m).   Weight as of this encounter: 93.4 kg.     Code Status: Full  Family Communication: None at bedside  Disposition Plan: Status is: Inpatient Remains inpatient appropriate because: Level of care      Consultants: None  Procedures: None  Antimicrobials: None  DVT prophylaxis: Lovenox   Objective: Vitals:   09/29/23 1100 09/29/23 1122 09/29/23 1234 09/29/23 1433  BP: 125/76  127/89   Pulse:   (!) 112   Resp: 16  20 20   Temp:  98.9 F (37.2 C) 99 F (37.2 C)   TempSrc:  Oral Oral   SpO2: 99%  100%   Weight:      Height:        Intake/Output Summary (Last 24 hours) at 09/29/2023 1533 Last data filed at 09/29/2023 1400 Gross per 24 hour  Intake 1037.35 ml  Output --  Net 1037.35 ml   Filed Weights   09/28/23 1945  Weight: 93.4 kg    Exam: General: NAD  Cardiovascular: S1, S2 present Respiratory: CTAB Abdomen: Soft, nontender, nondistended, bowel sounds present Musculoskeletal: No bilateral pedal edema noted Skin: Normal Psychiatry: Normal mood     Data Reviewed: CBC: Recent Labs  Lab 09/25/23 2305 09/26/23 0152 09/28/23 1943 09/29/23 0405  WBC 5.0  --  6.0 6.1  NEUTROABS  --   --  3.5  --   HGB 15.0 16.7 16.9 14.9  HCT 44.8 49.0 52.4* 45.6  MCV 84.7  --  87.9 86.2  PLT 229  --  237 239   Basic Metabolic Panel: Recent Labs  Lab 09/25/23 2305 09/26/23 0152 09/28/23 2118 09/29/23 0405  NA 124* 123* 136 135  K 4.9 7.9* 3.9 3.7  CL 87*  --  103 103  CO2 24  --  25 23   GLUCOSE 833*  --  115* 153*  BUN 19  --  17 17  CREATININE 1.59*  --  1.23 1.04  CALCIUM 8.9  --  9.3 9.2  MG  --   --   --  2.0   GFR: Estimated Creatinine Clearance: 112.4 mL/min (by C-G formula based on SCr of 1.04 mg/dL). Liver Function Tests: Recent Labs  Lab 09/28/23 2118  AST 25  ALT 17  ALKPHOS 65  BILITOT 0.7  PROT 6.9  ALBUMIN 3.5   Recent Labs  Lab 09/28/23 2118  LIPASE 84*   No results for input(s): "AMMONIA" in the last 168 hours. Coagulation Profile: No results for input(s): "INR", "PROTIME" in the last 168 hours. Cardiac Enzymes: No results for input(s): "CKTOTAL", "CKMB", "CKMBINDEX", "TROPONINI" in the last 168 hours. BNP (last 3 results) No results for input(s): "PROBNP" in the last 8760 hours. HbA1C: No results for input(s): "HGBA1C" in the last 72 hours. CBG: Recent Labs  Lab 09/29/23 0404 09/29/23 0521 09/29/23 0634 09/29/23 0806 09/29/23 1226  GLUCAP 139* 47* 206* 256* 365*   Lipid Profile: No results for input(s): "CHOL", "HDL", "LDLCALC", "TRIG", "CHOLHDL", "LDLDIRECT" in the last 72 hours. Thyroid Function Tests: No results for input(s): "TSH", "T4TOTAL", "FREET4", "T3FREE", "THYROIDAB" in the last 72 hours. Anemia Panel: No results for input(s): "VITAMINB12", "FOLATE", "FERRITIN", "TIBC", "IRON", "RETICCTPCT" in the last 72 hours. Urine analysis:    Component Value Date/Time   COLORURINE YELLOW 09/29/2023 1043   APPEARANCEUR CLEAR 09/29/2023 1043   LABSPEC 1.029 09/29/2023 1043   PHURINE 5.0 09/29/2023 1043   GLUCOSEU >=500 (A) 09/29/2023 1043   HGBUR NEGATIVE 09/29/2023 1043   BILIRUBINUR NEGATIVE 09/29/2023 1043   KETONESUR NEGATIVE 09/29/2023 1043   PROTEINUR 30 (A) 09/29/2023 1043   NITRITE NEGATIVE 09/29/2023 1043   LEUKOCYTESUR NEGATIVE 09/29/2023 1043   Sepsis Labs: @LABRCNTIP (procalcitonin:4,lacticidven:4)  )No results found for this or any previous visit (from the past 240 hours).    Studies: DG CHEST PORT 1  VIEW Result Date: 09/29/2023 CLINICAL DATA:  Tachycardia EXAM: PORTABLE CHEST 1 VIEW COMPARISON:  09/25/2023 FINDINGS: The heart size and mediastinal contours are within normal limits. Both lungs are clear. The visualized skeletal structures are unremarkable. IMPRESSION: No active disease. Electronically Signed   By: Jasmine Pang M.D.   On: 09/29/2023 15:11    Scheduled Meds:  enoxaparin (LOVENOX) injection  40 mg Subcutaneous Q24H   insulin aspart  0-5 Units Subcutaneous QHS   insulin aspart  0-9 Units Subcutaneous TID WC   lisinopril  20 mg Oral Daily   nicotine  21 mg Transdermal Daily   sodium chloride flush  3 mL Intravenous Q12H    Continuous Infusions:  sodium chloride 100 mL/hr at 09/29/23 1400     LOS: 0 days     Briant Cedar, MD [facility]  If 7PM-7AM, please contact night-coverage [email or web address] 09/29/2023, 3:33 PM

## 2023-09-29 NOTE — ED Notes (Addendum)
 RN attempted to have secretary page MD, RN went to ED MD and explained pt to him, MD gives this RN a verbal order for 25g of dextrose injection. This will be the 4th round pt has had today.

## 2023-09-29 NOTE — Hospital Course (Signed)
 [name] [name] is a 38 y.o. male with medical history significant for uncontrolled insulin-dependent T2DM, HTN, substance use with substance-induced mood disorder, homeless who is admitted for hypoglycemia.

## 2023-09-29 NOTE — ED Notes (Addendum)
 Pt called this RN to room and states "I feel like my sugar is low again."  CBG 47 from R pinky prick per pt request. Pt given 2 cups of orange juice, 2 breakfast sandwiches, and crackers with peanut butter. Dr Garner Nash notified; see new orders. Pt encouraged to provide urine specimen in urinal at bedside.

## 2023-09-30 ENCOUNTER — Inpatient Hospital Stay (HOSPITAL_COMMUNITY)

## 2023-09-30 ENCOUNTER — Encounter (HOSPITAL_COMMUNITY): Payer: Self-pay | Admitting: Internal Medicine

## 2023-09-30 ENCOUNTER — Other Ambulatory Visit (HOSPITAL_COMMUNITY): Payer: Self-pay

## 2023-09-30 DIAGNOSIS — R9431 Abnormal electrocardiogram [ECG] [EKG]: Secondary | ICD-10-CM

## 2023-09-30 DIAGNOSIS — E11649 Type 2 diabetes mellitus with hypoglycemia without coma: Secondary | ICD-10-CM | POA: Diagnosis not present

## 2023-09-30 LAB — ECHOCARDIOGRAM COMPLETE
AR max vel: 2.86 cm2
AV Area VTI: 2.55 cm2
AV Area mean vel: 2.58 cm2
AV Mean grad: 4 mmHg
AV Peak grad: 6.4 mmHg
Ao pk vel: 1.26 m/s
Area-P 1/2: 3.03 cm2
Calc EF: 60.9 %
Height: 71 in
MV VTI: 2.62 cm2
S' Lateral: 3.4 cm
Single Plane A2C EF: 71.4 %
Single Plane A4C EF: 44.8 %
Weight: 3296 [oz_av]

## 2023-09-30 LAB — GLUCOSE, CAPILLARY
Glucose-Capillary: 163 mg/dL — ABNORMAL HIGH (ref 70–99)
Glucose-Capillary: 166 mg/dL — ABNORMAL HIGH (ref 70–99)
Glucose-Capillary: 188 mg/dL — ABNORMAL HIGH (ref 70–99)
Glucose-Capillary: 192 mg/dL — ABNORMAL HIGH (ref 70–99)
Glucose-Capillary: 211 mg/dL — ABNORMAL HIGH (ref 70–99)

## 2023-09-30 LAB — BASIC METABOLIC PANEL
Anion gap: 8 (ref 5–15)
BUN: 20 mg/dL (ref 6–20)
CO2: 23 mmol/L (ref 22–32)
Calcium: 8.1 mg/dL — ABNORMAL LOW (ref 8.9–10.3)
Chloride: 101 mmol/L (ref 98–111)
Creatinine, Ser: 0.91 mg/dL (ref 0.61–1.24)
GFR, Estimated: >60 mL/min (ref >60)
Glucose, Bld: 189 mg/dL — ABNORMAL HIGH (ref 70–99)
Potassium: 4.3 mmol/L (ref 3.5–5.1)
Sodium: 132 mmol/L — ABNORMAL LOW (ref 135–145)

## 2023-09-30 MED ORDER — LISINOPRIL 20 MG PO TABS
20.0000 mg | ORAL_TABLET | Freq: Every day | ORAL | 0 refills | Status: DC
  Filled 2023-09-30: qty 30, 30d supply, fill #0

## 2023-09-30 MED ORDER — INSULIN LISPRO (1 UNIT DIAL) 100 UNIT/ML (KWIKPEN): 6.0000 [IU] | PEN_INJECTOR | Freq: Three times a day (TID) | SUBCUTANEOUS | Status: DC

## 2023-09-30 MED ORDER — INSULIN GLARGINE 100 UNITS/ML SOLOSTAR PEN: 25.0000 [IU] | PEN_INJECTOR | Freq: Every day | SUBCUTANEOUS | Status: DC

## 2023-09-30 NOTE — TOC Initial Note (Signed)
 Transition of Care [facility]) - Initial/Assessment Note   Patient Details  Name: [name] [name] MRN: [id number] Date of Birth: 1985/12/24  [facility]) CM/SW Contact:    Ewing Schlein, LCSW Phone Number: 09/30/2023, 11:39 AM  Clinical Narrative: SDOH screening indicated food insecurity. Food pantry resources for [employer] added to AVS.                Expected Discharge Plan: Home/Self Care Barriers to Discharge: Continued Medical Work up  Expected Discharge Plan and Services In-house Referral: Clinical Social Work Living arrangements for the past 2 months: Single Family Home           DME Arranged: N/A DME Agency: NA  Prior Living Arrangements/Services Living arrangements for the past 2 months: Single Family Home Patient language and need for interpreter reviewed:: Yes Do you feel safe going back to the place where you live?: Yes      Need for Family Participation in Patient Care: No (Comment) Care giver support system in place?: Yes (comment) Criminal Activity/Legal Involvement Pertinent to Current Situation/Hospitalization: No - Comment as needed  Activities of Daily Living ADL Screening (condition at time of admission) Independently performs ADLs?: Yes (appropriate for developmental age) Is the patient deaf or have difficulty hearing?: No Does the patient have difficulty seeing, even when wearing glasses/contacts?: No Does the patient have difficulty concentrating, remembering, or making decisions?: No  Emotional Assessment Orientation: : Oriented to Self, Oriented to Place, Oriented to  Time, Oriented to Situation Alcohol / Substance Use: Not Applicable Psych Involvement: No (comment)  Admission diagnosis:  Hypoglycemia [E16.2] Hypoglycemia associated with type 2 diabetes mellitus (HCC) [E11.649] Patient Active Problem List   Diagnosis Date Noted   Type 2 diabetes mellitus with hyperosmolar hyperglycemic state (HHS) (HCC) 08/25/2023   Sepsis (HCC) 07/20/2023    Influenza B 07/19/2023   Current episode of major depressive disorder without prior episode 07/14/2023   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   Hyperkalemia 07/04/2023   Hypoglycemia associated with type 2 diabetes mellitus (HCC) 05/07/2023   Hyperglycemia 04/20/2023   Acute metabolic encephalopathy 04/20/2023   Abdominal pain 01/01/2023   Tobacco abuse 01/01/2023   MSSA bacteremia 05/19/2022   Hyperosmolar hyperglycemic state (HHS) (HCC) 05/16/2022   Hypertension associated with diabetes (HCC) 05/08/2022   AKI (acute kidney injury) (HCC) 05/08/2022   Pseudohyponatremia 05/08/2022   DM2 (diabetes mellitus, type 2) (HCC) 05/08/2022   Substance induced mood disorder (HCC) 04/06/2022   PCP:  [name], NP Pharmacy:   [facility] [address] [place] [address] Phone: [phone] Fax: [phone]  [id number] - [place], [place] - [address] AT [facility] [address] [place] [address] Phone: [phone] Fax: [phone]  [id number] - [place], [place] - [address] AT [facility] OF [address] [place] [address] Phone: [phone] Fax: [phone]  [facility] [phone] - [place], [place] - [address] AT [facility] OF [address] [place] [address] Phone: [phone] Fax: [phone]  [facility] [address] [place] - [address] [place] [address] Phone: [phone] Fax: [phone]  Social Drivers of Health (SDOH) Social History: SDOH Screenings   Food Insecurity: Food Insecurity Present (09/29/2023)  Housing: Patient Declined (09/29/2023)  Transportation Needs: No Transportation Needs (09/29/2023)  Recent Concern: Transportation Needs - Unmet  Transportation Needs (07/03/2023)  Utilities: Patient Declined (09/29/2023)  Recent Concern:  Utilities - At Risk (07/03/2023)  Tobacco Use: High Risk (09/30/2023)   SDOH Interventions: Food Insecurity Interventions: Inpatient TOC, Other (Comment) (Food pantry information for [employer] added to AVS.) Housing Interventions: Inpatient TOC, Intervention Not Indicated  Readmission Risk Interventions    09/30/2023   11:37 AM 07/21/2023   12:30 PM 07/04/2023   12:49 PM  Readmission Risk Prevention Plan  Transportation Screening Complete Complete Complete  Medication Review Oceanographer) Complete Complete Complete  PCP or Specialist appointment within 3-5 days of discharge  Complete Complete  HRI or Home Care Consult Complete Complete Complete  HRI or Home Care Consult Pt Refusal Comments  N/A   SW Recovery Care/Counseling Consult Complete Complete Complete  Palliative Care Screening Not Applicable Not Applicable Not Applicable  Skilled Nursing Facility Not Applicable Not Applicable Not Applicable

## 2023-09-30 NOTE — Inpatient Diabetes Management (Signed)
 Inpatient Diabetes Program Recommendations  AACE/ADA: New Consensus Statement on Inpatient Glycemic Control (2015)  Target Ranges:  Prepandial:   less than 140 mg/dL      Peak postprandial:   less than 180 mg/dL (1-2 hours)      Critically ill patients:  140 - 180 mg/dL   Lab Results  Component Value Date   GLUCAP 188 (H) 09/30/2023   HGBA1C 12.4 (H) 04/20/2023    Latest Reference Range & Units 09/29/23 08:06 09/29/23 12:26 09/29/23 16:37 09/29/23 20:09 09/30/23 00:04 09/30/23 04:30 09/30/23 07:46 09/30/23 11:32  Glucose-Capillary 70 - 99 mg/dL 784 (H) 696 (H) 295 (H) 211 (H) 166 (H) 163 (H) 192 (H) 188 (H)  (H): Data is abnormally high  Diabetes history: DM Outpatient Diabetes medications: Lantus 25 units daily, Humalog 8 units tid meal coverage Current orders for Inpatient glycemic control: Novolog 0-9 units tid, 0-5 units hs  Inpatient Diabetes Program Recommendations:   Spoke with patient via phone (DM coordinator working @ [employer]). Reviewed insulin doses with patient. Patient had not taken basal insulin x 2 days and took Humalog 15 units without eating. Patient acknowledges understanding of insulin dose changes although DM coordinators have assisted with diabetes management on multiple admission.  Discharge Recommendations: Long acting recommendations: Insulin Glargine (LANTUS) Solostar Pen 25 units daily  Short acting recommendations:  Meal coverage ONLY Insulin lispro (HUMALOG) KwikPen  6 units tid meal coverage   Hypoglycemia treatment recommendations: GVoke 1mg  Supply/Referral recommendations: Pen needles - standard Lancets Lancet device Test strips   Use Adult Diabetes Insulin Treatment Post Discharge order set.  Thank you, [name] [name]. Lester Platas, RN, MSN, CDCES  Diabetes Coordinator Inpatient Glycemic Control Team Team Pager [phone] (8am-5pm) 09/30/2023 2:12 PM

## 2023-09-30 NOTE — Discharge Summary (Signed)
 Physician Discharge Summary   Patient: [name] [name] MRN: [id number] DOB: 07/06/1986  Admit date:     09/28/2023  Discharge date: 09/30/23  Discharge Physician: Briant Cedar   PCP: [name], NP   Recommendations at discharge:   Follow-up with PCP Follow-up with food banks as listed to ensure better access to food  Discharge Diagnoses: Principal Problem:   Hypoglycemia associated with type 2 diabetes mellitus (HCC) Active Problems:   Hypertension associated with diabetes [facility])    Hospital Course: NIKLAUS MAMARIL is a 38 y.o. male with medical history significant for uncontrolled T2DM, HTN, substance use with substance-induced mood disorder, homeless who was brought to the ED by EMS after he was found unresponsive at a [employer].  Noted to be somnolent, diaphoretic, and found to be hypoglycemic, initial CBG was 72.  Of note, patient seen frequently in hospital for issues related to uncontrolled diabetes, usually hyperglycemia.  Patient reported poor oral intake with limited access to food, reports taking his long-acting insulin 2 nights ago (Friday night), and on 3/3 afternoon took 15 units of his short acting Humalog (prescribed 8 units TID with meals) without eating.  In the ED, patient noted to have persistent hypoglycemia despite being on IVF D10 and given several sandwiches, OJ, regular soda.  Vital signs with persistent tachycardia, otherwise unremarkable.  Labs fairly stable. The hospitalist service was consulted to admit due to persistent hypoglycemia.     Today, patient denies any new complaints.  Patient has been given resources to food pantry to help with food access.  Diabetes coordinator consulted to educate patient's once again about insulin education.    Assessment and Plan:  Hypoglycemia in setting of uncontrolled insulin-dependent type 2 diabetes Initial persistent hypoglycemia in setting of insulin use without food intake CBG with better control status  post IV D10 Advised to take 25 units of Lantus, 6U of novolog 3 times daily with meals Food pantry resources provided Diabetes coordinator consulted, and educated patient   Sinus tachycardia- resolved Prolonged Qtc- resolved D-dimer WNL Chest x-ray unremarkable Last echo done showed EF to 55 to 60%, no regional wall motion abnormality, diastolic parameters were normal   Hypertension Continue lisinopril   History of substance use Tobacco use Patient reports smoking 0.5 PPD He denies recent alcohol or illicit drug use UDS showing amphetamines     Consultants: None Procedures performed: None Disposition: Shelter Diet recommendation:  Cardiac and Carb modified diet    DISCHARGE MEDICATION: Allergies as of 09/30/2023   No Known Allergies      Medication List     TAKE these medications    Accu-Chek Softclix Lancets lancets Use to check blood sugar three times daily   blood glucose meter kit and supplies Kit Use to check blood sugar three times daily   BLOOD GLUCOSE TEST STRIPS Strp Use test strip to check blood sugar three times daily   insulin glargine 100 unit/mL Sopn Commonly known as: LANTUS Inject 25 Units into the skin at bedtime. What changed: how much to take   insulin lispro 100 UNIT/ML KwikPen Commonly known as: HUMALOG Inject 6 Units into the skin 3 (three) times daily with meals. Only take if eating a meal AND Blood Glucose (BG) is 80 or higher. What changed: how much to take   Lancet Device Misc Use 3 (three) times daily.   lisinopril 20 MG tablet Commonly known as: ZESTRIL Take 1 tablet (20 mg total) by mouth daily.   Pen Needles 31G  X 5 MM Misc Use  three times daily        Follow-up Information     [facility]. Call.   Why: Call for food pantry assistance. Contact information: [address] [place], [place] [address] [phone]        [facility] [employer]. Call.   Why: Call for food pantry  assistance. Contact information: [address] [place], [place] [address] [phone]        Placey, [name], NP. Schedule an appointment as soon as possible for a visit.   Contact information: [address] [place] [address] [phone]                Discharge Exam: [name] Weights   09/28/23 1945  Weight: 93.4 kg   General: NAD  Cardiovascular: S1, S2 present Respiratory: CTAB Abdomen: Soft, nontender, nondistended, bowel sounds present Musculoskeletal: No bilateral pedal edema noted Skin: Normal Psychiatry: Normal mood   Condition at discharge: stable  The results of significant diagnostics from this hospitalization (including imaging, microbiology, ancillary and laboratory) are listed below for reference.   Imaging Studies: ECHOCARDIOGRAM COMPLETE Result Date: 09/30/2023    ECHOCARDIOGRAM REPORT   Patient Name:   [name] [name] Date of Exam: 09/30/2023 Medical Rec #:  [id number]      Height:       71.0 in Accession #:    [id number]     Weight:       206.0 lb Date of Birth:  11-Apr-1986      BSA:          2.135 m Patient Age:    37 years       BP:           126/77 mmHg Patient Gender: M              HR:           92 bpm. Exam Location:  Inpatient Procedure: 2D Echo, Cardiac Doppler and Color Doppler (Both Spectral and Color            Flow Doppler were utilized during procedure). Indications:    Abnormal EKG  History:        Patient has prior history of Echocardiogram examinations, most                 recent 05/18/2022. Risk Factors:Hypertension and Diabetes.  Sonographer:    Amy Chionchio Referring Phys: [id number] Monica Martinez Saja Bartolini IMPRESSIONS  1. Left ventricular ejection fraction, by estimation, is 55 to 60%. The left ventricle has normal function. The left ventricle has no regional wall motion abnormalities. Left ventricular diastolic parameters were normal.  2. Right ventricular systolic function is normal. The right ventricular size is normal. There is normal  pulmonary artery systolic pressure. The estimated right ventricular systolic pressure is 24.5 mmHg.  3. The mitral valve is grossly normal. Trivial mitral valve regurgitation. No evidence of mitral stenosis.  4. The aortic valve is tricuspid. Aortic valve regurgitation is not visualized. No aortic stenosis is present.  5. The inferior vena cava is normal in size with greater than 50% respiratory variability, suggesting right atrial pressure of 3 mmHg. FINDINGS  Left Ventricle: Left ventricular ejection fraction, by estimation, is 55 to 60%. The left ventricle has normal function. The left ventricle has no regional wall motion abnormalities. The left ventricular internal cavity size was normal in size. There is  no left ventricular hypertrophy. Left ventricular diastolic parameters were  normal. Right Ventricle: The right ventricular size is normal. No increase in right ventricular wall thickness. Right ventricular systolic function is normal. There is normal pulmonary artery systolic pressure. The tricuspid regurgitant velocity is 2.32 m/s, and  with an assumed right atrial pressure of 3 mmHg, the estimated right ventricular systolic pressure is 24.5 mmHg. Left Atrium: Left atrial size was normal in size. Right Atrium: Right atrial size was normal in size. Pericardium: There is no evidence of pericardial effusion. Mitral Valve: The mitral valve is grossly normal. Trivial mitral valve regurgitation. No evidence of mitral valve stenosis. MV peak gradient, 2.3 mmHg. The mean mitral valve gradient is 1.0 mmHg. Tricuspid Valve: The tricuspid valve is grossly normal. Tricuspid valve regurgitation is trivial. No evidence of tricuspid stenosis. Aortic Valve: The aortic valve is tricuspid. Aortic valve regurgitation is not visualized. No aortic stenosis is present. Aortic valve mean gradient measures 4.0 mmHg. Aortic valve peak gradient measures 6.4 mmHg. Aortic valve area, by VTI measures 2.55 cm. Pulmonic Valve: The  pulmonic valve was grossly normal. Pulmonic valve regurgitation is trivial. No evidence of pulmonic stenosis. Aorta: The aortic root and ascending aorta are structurally normal, with no evidence of dilitation. Venous: The inferior vena cava is normal in size with greater than 50% respiratory variability, suggesting right atrial pressure of 3 mmHg. IAS/Shunts: The atrial septum is grossly normal.  LEFT VENTRICLE PLAX 2D LVIDd:         4.60 cm      Diastology LVIDs:         3.40 cm      LV e' medial:    7.94 cm/s LV PW:         1.10 cm      LV E/e' medial:  7.8 LV IVS:        1.10 cm      LV e' lateral:   7.40 cm/s LVOT diam:     2.10 cm      LV E/e' lateral: 8.4 LV SV:         48 LV SV Index:   23 LVOT Area:     3.46 cm  LV Volumes (MOD) LV vol d, MOD A2C: 114.0 ml LV vol d, MOD A4C: 99.7 ml LV vol s, MOD A2C: 32.6 ml LV vol s, MOD A4C: 55.0 ml LV SV MOD A2C:     81.4 ml LV SV MOD A4C:     99.7 ml LV SV MOD BP:      67.5 ml RIGHT VENTRICLE          IVC RV Basal diam:  3.50 cm  IVC diam: 1.40 cm LEFT ATRIUM             Index        RIGHT ATRIUM           Index LA Vol (A2C):   46.4 ml 21.73 ml/m  RA Area:     13.70 cm LA Vol (A4C):   60.1 ml 28.15 ml/m  RA Volume:   33.90 ml  15.88 ml/m LA Biplane Vol: 52.9 ml 24.78 ml/m  AORTIC VALVE                    PULMONIC VALVE AV Area (Vmax):    2.86 cm     PV Vmax:          0.98 m/s AV Area (Vmean):   2.58 cm     PV Peak grad:     3.8  mmHg AV Area (VTI):     2.55 cm     PR End Diast Vel: 5.86 msec AV Vmax:           126.00 cm/s AV Vmean:          92.500 cm/s AV VTI:            0.190 m AV Peak Grad:      6.4 mmHg AV Mean Grad:      4.0 mmHg LVOT Vmax:         104.00 cm/s LVOT Vmean:        68.800 cm/s LVOT VTI:          0.140 m LVOT/AV VTI ratio: 0.74  AORTA Ao Root diam: 3.40 cm Ao Asc diam:  3.20 cm MITRAL VALVE               TRICUSPID VALVE MV Area (PHT): 3.03 cm    TR Peak grad:   21.5 mmHg MV Area VTI:   2.62 cm    TR Vmax:        232.00 cm/s MV Peak grad:  2.3  mmHg MV Mean grad:  1.0 mmHg    SHUNTS MV Vmax:       0.76 m/s    Systemic VTI:  0.14 m MV Vmean:      49.4 cm/s   Systemic Diam: 2.10 cm MV Decel Time: 250 msec MV E velocity: 61.80 cm/s MV A velocity: 74.90 cm/s MV E/A ratio:  0.83 Lennie Odor MD Electronically signed by Lennie Odor MD Signature Date/Time: 09/30/2023/12:23:01 PM    Final    DG CHEST PORT 1 VIEW Result Date: 09/29/2023 CLINICAL DATA:  Tachycardia EXAM: PORTABLE CHEST 1 VIEW COMPARISON:  09/25/2023 FINDINGS: The heart size and mediastinal contours are within normal limits. Both lungs are clear. The visualized skeletal structures are unremarkable. IMPRESSION: No active disease. Electronically Signed   By: Jasmine Pang M.D.   On: 09/29/2023 15:11   DG Chest 2 View Result Date: 09/26/2023 CLINICAL DATA:  Chest pain x1 day. EXAM: CHEST - 2 VIEW COMPARISON:  September 10, 2023 FINDINGS: The heart size and mediastinal contours are within normal limits. Both lungs are clear. The visualized skeletal structures are unremarkable. IMPRESSION: No active cardiopulmonary disease. Electronically Signed   By: Aram Candela M.D.   On: 09/26/2023 00:29   DG Chest 2 View Result Date: 09/10/2023 CLINICAL DATA:  Mid chest pain on and off for several weeks EXAM: CHEST - 2 VIEW COMPARISON:  08/27/2023 FINDINGS: Stable cardiomediastinal silhouette. No focal consolidation, pleural effusion, or pneumothorax. No displaced rib fractures. IMPRESSION: No active cardiopulmonary disease. Electronically Signed   By: Minerva Fester M.D.   On: 09/10/2023 03:44    Microbiology: Results for orders placed or performed during the hospital encounter of 09/10/23  Resp panel by RT-PCR (RSV, Flu A&B, Covid) Anterior Nasal Swab     Status: None   Collection Time: 09/10/23  5:52 AM   Specimen: Anterior Nasal Swab  Result Value Ref Range Status   SARS Coronavirus 2 by RT PCR NEGATIVE NEGATIVE Final    Comment: (NOTE) SARS-CoV-2 target nucleic acids are NOT  DETECTED.  The SARS-CoV-2 RNA is generally detectable in upper respiratory specimens during the acute phase of infection. The lowest concentration of SARS-CoV-2 viral copies this assay can detect is 138 copies/mL. A negative result does not preclude SARS-Cov-2 infection and should not be used as the sole basis for treatment or other patient management decisions.  A negative result may occur with  improper specimen collection/handling, submission of specimen other than nasopharyngeal swab, presence of viral mutation(s) within the areas targeted by this assay, and inadequate number of viral copies(<138 copies/mL). A negative result must be combined with clinical observations, patient history, and epidemiological information. The expected result is Negative.  Fact Sheet for Patients:  BloggerCourse.com  Fact Sheet for Healthcare Providers:  SeriousBroker.it  This test is no t yet approved or cleared by the [place] FDA and  has been authorized for detection and/or diagnosis of SARS-CoV-2 by FDA under an Emergency Use Authorization (EUA). This EUA will remain  in effect (meaning this test can be used) for the duration of the COVID-19 declaration under Section 564(b)(1) of the Act, 21 U.S.C.section 360bbb-3(b)(1), unless the authorization is terminated  or revoked sooner.       Influenza A by PCR NEGATIVE NEGATIVE Final   Influenza B by PCR NEGATIVE NEGATIVE Final    Comment: (NOTE) The Xpert Xpress SARS-CoV-2/FLU/RSV plus assay is intended as an aid in the diagnosis of influenza from Nasopharyngeal swab specimens and should not be used as a sole basis for treatment. Nasal washings and aspirates are unacceptable for Xpert Xpress SARS-CoV-2/FLU/RSV testing.  Fact Sheet for Patients: BloggerCourse.com  Fact Sheet for Healthcare Providers: SeriousBroker.it  This test is not yet  approved or cleared by the [place] FDA and has been authorized for detection and/or diagnosis of SARS-CoV-2 by FDA under an Emergency Use Authorization (EUA). This EUA will remain in effect (meaning this test can be used) for the duration of the COVID-19 declaration under Section 564(b)(1) of the Act, 21 U.S.C. section 360bbb-3(b)(1), unless the authorization is terminated or revoked.     Resp Syncytial Virus by PCR NEGATIVE NEGATIVE Final    Comment: (NOTE) Fact Sheet for Patients: BloggerCourse.com  Fact Sheet for Healthcare Providers: SeriousBroker.it  This test is not yet approved or cleared by the [place] FDA and has been authorized for detection and/or diagnosis of SARS-CoV-2 by FDA under an Emergency Use Authorization (EUA). This EUA will remain in effect (meaning this test can be used) for the duration of the COVID-19 declaration under Section 564(b)(1) of the Act, 21 U.S.C. section 360bbb-3(b)(1), unless the authorization is terminated or revoked.  Performed at [facility], [address], [place], [place] [address]     Labs: CBC: Recent Labs  Lab 09/25/23 2305 09/26/23 0152 09/28/23 1943 09/29/23 0405  WBC 5.0  --  6.0 6.1  NEUTROABS  --   --  3.5  --   HGB 15.0 16.7 16.9 14.9  HCT 44.8 49.0 52.4* 45.6  MCV 84.7  --  87.9 86.2  PLT 229  --  237 239   Basic Metabolic Panel: Recent Labs  Lab 09/25/23 2305 09/26/23 0152 09/28/23 2118 09/29/23 0405 09/30/23 0547  NA 124* 123* 136 135 132*  K 4.9 7.9* 3.9 3.7 4.3  CL 87*  --  103 103 101  CO2 24  --  25 23 23   GLUCOSE 833*  --  115* 153* 189*  BUN 19  --  17 17 20   CREATININE 1.59*  --  1.23 1.04 0.91  CALCIUM 8.9  --  9.3 9.2 8.1*  MG  --   --   --  2.0  --    Liver Function Tests: Recent Labs  Lab 09/28/23 2118  AST 25  ALT 17  ALKPHOS 65  BILITOT 0.7  PROT 6.9  ALBUMIN 3.5  CBG: Recent Labs  Lab  09/29/23 2009 09/30/23 0004 09/30/23 0430 09/30/23 0746 09/30/23 1132  GLUCAP 211* 166* 163* 192* 188*    Discharge time spent: greater than 30 minutes.  Signed: Briant Cedar, MD [facility] 09/30/2023

## 2023-09-30 NOTE — Progress Notes (Signed)
 Patient discharging today. All discharge Instructions including all discharge Medications and schedules reviewed with the Patient. Patient verbalized understanding of all discharge instructions Discharge AVS with the Patient at discharge

## 2023-09-30 NOTE — Progress Notes (Signed)
 Discharge RN delivered Patient's Home Medications picked up from the Outpatient Pharmacy and brought them to the Patient

## 2023-10-09 ENCOUNTER — Emergency Department (HOSPITAL_COMMUNITY)

## 2023-10-09 ENCOUNTER — Emergency Department (HOSPITAL_COMMUNITY)
Admission: EM | Admit: 2023-10-09 | Discharge: 2023-10-09 | Disposition: A | Discharge status: Home / Self Care | Attending: Emergency Medicine | Admitting: Emergency Medicine

## 2023-10-09 ENCOUNTER — Other Ambulatory Visit: Payer: Self-pay

## 2023-10-09 DIAGNOSIS — Z79899 Other long term (current) drug therapy: Secondary | ICD-10-CM | POA: Diagnosis not present

## 2023-10-09 DIAGNOSIS — Z794 Long term (current) use of insulin: Secondary | ICD-10-CM | POA: Diagnosis not present

## 2023-10-09 DIAGNOSIS — R0789 Other chest pain: Secondary | ICD-10-CM | POA: Diagnosis not present

## 2023-10-09 DIAGNOSIS — R944 Abnormal results of kidney function studies: Secondary | ICD-10-CM | POA: Diagnosis not present

## 2023-10-09 DIAGNOSIS — Z5900 Homelessness unspecified: Secondary | ICD-10-CM | POA: Diagnosis not present

## 2023-10-09 DIAGNOSIS — E1165 Type 2 diabetes mellitus with hyperglycemia: Secondary | ICD-10-CM | POA: Diagnosis not present

## 2023-10-09 DIAGNOSIS — R739 Hyperglycemia, unspecified: Secondary | ICD-10-CM

## 2023-10-09 DIAGNOSIS — R079 Chest pain, unspecified: Secondary | ICD-10-CM | POA: Diagnosis not present

## 2023-10-09 DIAGNOSIS — I10 Essential (primary) hypertension: Secondary | ICD-10-CM | POA: Diagnosis not present

## 2023-10-09 LAB — CBC WITH DIFFERENTIAL/PLATELET
Abs Immature Granulocytes: 0.01 10*3/uL (ref 0.00–0.07)
Basophils Absolute: 0.1 10*3/uL (ref 0.0–0.1)
Basophils Relative: 1 %
Eosinophils Absolute: 0.1 10*3/uL (ref 0.0–0.5)
Eosinophils Relative: 2 %
HCT: 43.2 % (ref 39.0–52.0)
Hemoglobin: 14.4 g/dL (ref 13.0–17.0)
Immature Granulocytes: 0 %
Lymphocytes Relative: 49 %
Lymphs Abs: 1.9 10*3/uL (ref 0.7–4.0)
MCH: 28.3 pg (ref 26.0–34.0)
MCHC: 33.3 g/dL (ref 30.0–36.0)
MCV: 84.9 fL (ref 80.0–100.0)
Monocytes Absolute: 0.5 10*3/uL (ref 0.1–1.0)
Monocytes Relative: 13 %
Neutro Abs: 1.4 10*3/uL — ABNORMAL LOW (ref 1.7–7.7)
Neutrophils Relative %: 35 %
Platelets: 252 10*3/uL (ref 150–400)
RBC: 5.09 MIL/uL (ref 4.22–5.81)
RDW: 12.4 % (ref 11.5–15.5)
WBC: 4 10*3/uL (ref 4.0–10.5)
nRBC: 0 % (ref 0.0–0.2)

## 2023-10-09 LAB — BASIC METABOLIC PANEL
Anion gap: 11 (ref 5–15)
BUN: 11 mg/dL (ref 6–20)
CO2: 22 mmol/L (ref 22–32)
Calcium: 8.9 mg/dL (ref 8.9–10.3)
Chloride: 98 mmol/L (ref 98–111)
Creatinine, Ser: 1.27 mg/dL — ABNORMAL HIGH (ref 0.61–1.24)
GFR, Estimated: >60 mL/min (ref >60)
Glucose, Bld: 449 mg/dL — ABNORMAL HIGH (ref 70–99)
Potassium: 4.4 mmol/L (ref 3.5–5.1)
Sodium: 131 mmol/L — ABNORMAL LOW (ref 135–145)

## 2023-10-09 LAB — TROPONIN I (HIGH SENSITIVITY): Troponin I (High Sensitivity): 6 ng/L (ref <18)

## 2023-10-09 LAB — CBG MONITORING, ED
Glucose-Capillary: 462 mg/dL — ABNORMAL HIGH (ref 70–99)
Glucose-Capillary: 340 mg/dL — ABNORMAL HIGH (ref 70–99)

## 2023-10-09 MED ORDER — INSULIN ASPART 100 UNIT/ML IJ SOLN
8.0000 [IU] | Freq: Once | INTRAMUSCULAR | Status: AC
  Administered 2023-10-09: 8 [IU] via SUBCUTANEOUS

## 2023-10-09 NOTE — ED Triage Notes (Signed)
 Pt BIB [facility] for chest pain. Pt c/o centralized chest pain that has been intermittent for the past 3-4 days. Pt states he woke up in a cold sweat, and assumed it was a problem with his blood sugar. CBG 121 mg/dL with EMS.

## 2023-10-09 NOTE — Discharge Instructions (Signed)
 Please read and follow all provided instructions.  Your diagnoses today include:  1. Chest pain, unspecified type   2. Hyperglycemia     Tests performed today include: An EKG of your heart: No changes A chest x-ray: No problems Cardiac enzymes - a blood test for heart muscle damage was normal Blood counts and electrolytes: Your blood sugar was high Vital signs. See below for your results today.   Medications prescribed:  None  Take any prescribed medications only as directed.  Follow-up instructions: Please follow-up with your primary care provider in 3 days.   Return instructions:  SEEK IMMEDIATE MEDICAL ATTENTION IF: You have severe chest pain, especially if the pain is crushing or pressure-like and spreads to the arms, back, neck, or jaw, or if you have sweating, nausea or vomiting, or trouble with breathing. THIS IS AN EMERGENCY. Do not wait to see if the pain will go away. Get medical help at once. Call 911. DO NOT drive yourself to the hospital.  Your chest pain gets worse and does not go away after a few minutes of rest.  You have an attack of chest pain lasting longer than what you usually experience.  You have significant dizziness, if you pass out, or have trouble walking.  You have chest pain not typical of your usual pain for which you originally saw your caregiver.  You have any other emergent concerns regarding your health.  Additional Information: Chest pain comes from many different causes. Your caregiver has diagnosed you as having chest pain that is not specific for one problem, but does not require admission.  You are at low risk for an acute heart condition or other serious illness.   Your vital signs today were: BP (!) 144/92 (BP Location: Right Arm)   Pulse 90   Temp 98.1 F (36.7 C) (Oral)   Resp 13   SpO2 100%  If your blood pressure (BP) was elevated above 135/85 this visit, please have this repeated by your doctor within one  month. --------------

## 2023-10-09 NOTE — ED Provider Notes (Signed)
  EMERGENCY DEPARTMENT AT [facility] Provider Note   CSN: [id number] Arrival date & time: 10/09/23  0534     History  Chief Complaint  Patient presents with   Chest Pain    [name] [name] is a 38 y.o. male.  Patient with uncontrolled T2DM, HTN, substance use with substance-induced mood disorder, homeless, admission 3/2-09/30/23 for persistent hypoglycemia.  Patient presents to the emergency department today by [place].  He had reported chest pain to EMS and to the RN.  Describes sharp pain in the mid chest, worse with breathing in and out.  He also is concerned about his blood sugars being low.  Patient denies other complaints to me.       Home Medications Prior to Admission medications   Medication Sig Start Date End Date Taking? Authorizing Provider  insulin glargine (LANTUS) 100 unit/mL SOPN Inject 25 Units into the skin at bedtime. 09/30/23 09/29/24 Yes Briant Cedar, MD  insulin lispro (HUMALOG) 100 UNIT/ML KwikPen Inject 6 Units into the skin 3 (three) times daily with meals. Only take if eating a meal AND Blood Glucose (BG) is 80 or higher. 09/30/23  Yes Briant Cedar, MD  blood glucose meter kit and supplies KIT Use to check blood sugar three times daily 09/17/23   Solon Augusta S, PA  Glucose Blood (BLOOD GLUCOSE TEST STRIPS) STRP Use test strip to check blood sugar three times daily 09/17/23   Solon Augusta S, [place]  Insulin Pen Needle (PEN NEEDLES) 31G X 5 MM MISC Use  three times daily 09/17/23   Gailen Shelter, PA  Lancet Device MISC Use 3 (three) times daily. 09/17/23   Gailen Shelter, PA  Lancets MISC Use to check blood sugar three times daily 08/26/23   Danford, Earl Lites, MD  lisinopril (ZESTRIL) 20 MG tablet Take 1 tablet (20 mg total) by mouth daily. 09/30/23 10/30/23  Briant Cedar, MD      Allergies    Patient has no known allergies.    Review of Systems   Review of Systems  Physical Exam Updated Vital  Signs BP (!) 144/92 (BP Location: Right Arm)   Pulse 90   Temp 98.1 F (36.7 C) (Oral)   Resp 13   SpO2 100%   Physical Exam Vitals and nursing note reviewed.  Constitutional:      Appearance: He is well-developed. He is not diaphoretic.     Comments: Patient awake and alert, responsive, minimal interest in participating in interview.  HENT:     Head: Normocephalic and atraumatic.     Mouth/Throat:     Mouth: Mucous membranes are not dry.  Eyes:     Conjunctiva/sclera: Conjunctivae normal.  Neck:     Vascular: Normal carotid pulses. No carotid bruit or JVD.     Trachea: Trachea normal. No tracheal deviation.  Cardiovascular:     Rate and Rhythm: Normal rate and regular rhythm.     Pulses: No decreased pulses.          Radial pulses are 2+ on the right side and 2+ on the left side.     Heart sounds: Normal heart sounds, S1 normal and S2 normal. Heart sounds not distant. No murmur heard.    Comments: No murmurs Pulmonary:     Effort: Pulmonary effort is normal. No respiratory distress.     Breath sounds: Normal breath sounds. No wheezing.  Chest:     Chest wall: No tenderness.  Abdominal:     General: Bowel sounds are normal.     Palpations: Abdomen is soft.     Tenderness: There is no abdominal tenderness. There is no guarding or rebound.     Comments: Abdomen soft and nontender  Musculoskeletal:     Cervical back: Normal range of motion and neck supple. No muscular tenderness.     Right lower leg: No edema.     Left lower leg: No edema.  Skin:    General: Skin is warm and dry.     Coloration: Skin is not pale.  Neurological:     Mental Status: He is alert. Mental status is at baseline.  Psychiatric:        Mood and Affect: Mood normal.     ED Results / Procedures / Treatments   Labs (all labs ordered are listed, but only abnormal results are displayed) Labs Reviewed  CBC WITH DIFFERENTIAL/PLATELET - Abnormal; Notable for the following components:      Result  Value   Neutro Abs 1.4 (*)    All other components within normal limits  BASIC METABOLIC PANEL - Abnormal; Notable for the following components:   Sodium 131 (*)    Glucose, Bld 449 (*)    Creatinine, Ser 1.27 (*)    All other components within normal limits  CBG MONITORING, ED - Abnormal; Notable for the following components:   Glucose-Capillary 462 (*)    All other components within normal limits  CBG MONITORING, ED - Abnormal; Notable for the following components:   Glucose-Capillary 340 (*)    All other components within normal limits  TROPONIN I (HIGH SENSITIVITY)    EKG EKG Interpretation Date/Time:  Thursday October 09 2023 05:40:14 EDT Ventricular Rate:  88 PR Interval:  139 QRS Duration:  109 QT Interval:  385 QTC Calculation: 466 R Axis:   -55  Text Interpretation: Sinus rhythm Left axis deviation Low voltage, precordial leads Confirmed by Alona Bene [phone]) on 10/09/2023 6:26:24 AM  Radiology No results found.  Procedures Procedures    Medications Ordered in ED Medications - No data to display  ED Course/ Medical Decision Making/ A&P    Patient seen and examined. History obtained directly from patient.  Reviewed recent ED visit notes and hospitalization records including normal echocardiogram March 4.  Labs/EKG: Ordered CBC, BMP, troponin.  Blood sugar on arrival in 400s.  Imaging: Ordered chest x-ray  Medications/Fluids: None ordered  Most recent vital signs reviewed and are as follows: BP (!) 144/92 (BP Location: Right Arm)   Pulse 90   Temp 98.1 F (36.7 C) (Oral)   Resp 13   SpO2 100%   Initial impression: Patient well-appearing with atypical symptoms.  Will recheck blood sugar, will recheck basic labs and troponin.  7:47 AM Reassessment performed. Patient appears stable.  Labs personally reviewed and interpreted including: CBC with normal white blood cell count and hemoglobin; BMP Shows glucose 449, sodium 131 corrects to near normal;  creatinine minimally elevated at 1.27, normal anion gap; troponin normal at 6.  Imaging personally visualized and interpreted including: Chest x-ray, no sign of infiltrate or pneumothorax.  Reviewed pertinent lab work and imaging with patient at bedside. Questions answered.   Most current vital signs reviewed and are as follows: BP (!) 144/92 (BP Location: Right Arm)   Pulse 90   Temp 98.1 F (36.7 C) (Oral)   Resp 13   SpO2 100%   Plan: Will give 8 units subcutaneous insulin for elevated blood  sugar, have patient eat and likely discharge.     Pt stable. On recheck, eating well. No concerns voiced regarding d/c.   Most current vital signs reviewed and are as follows: BP (!) 142/104   Pulse 90   Temp (!) 97.5 F (36.4 C) (Oral)   Resp 12   SpO2 98%   Plan: Discharge to home.   Prescriptions written for: None  Other home care instructions discussed: Continue home insulin regimen  ED return instructions discussed: Return with worsening symptoms  Follow-up instructions discussed: Patient encouraged to follow-up with their PCP in 7 days.                                  Medical Decision Making Amount and/or Complexity of Data Reviewed Labs: ordered. Radiology: ordered.  Risk Prescription drug management.   For this patient's complaint of chest pain, the following emergent conditions were considered on the differential diagnosis: acute coronary syndrome, pulmonary embolism, pneumothorax, myocarditis, pericardial tamponade, aortic dissection, thoracic aortic aneurysm complication, esophageal perforation.   Other causes were also considered including: gastroesophageal reflux disease, musculoskeletal pain including costochondritis, pneumonia/pleurisy, herpes zoster, pericarditis.  In regards to possibility of ACS, patient has atypical features of pain, non-ischemic and unchanged EKG and negative troponin(s). Heart score was calculated to be 2.   In regards to  possibility of PE, symptoms are atypical for PE and risk profile is low, making PE low likelihood.  Blood sugar actually elevated without signs of ketosis.  This was treated with subcutaneous insulin.  The patient's vital signs, pertinent lab work and imaging were reviewed and interpreted as discussed in the ED course. Hospitalization was considered for further testing, treatments, or serial exams/observation. However as patient is well-appearing, has a stable exam, and reassuring studies today, I do not feel that they warrant admission at this time. This plan was discussed with the patient who verbalizes agreement and comfort with this plan and seems reliable and able to return to the Emergency Department with worsening or changing symptoms.          Final Clinical Impression(s) / ED Diagnoses Final diagnoses:  Chest pain, unspecified type  Hyperglycemia    Rx / DC Orders ED Discharge Orders     None         Renne Crigler, PA-C 10/09/23 1200    Benjiman Core, MD 10/09/23 1552

## 2023-10-21 ENCOUNTER — Emergency Department (HOSPITAL_COMMUNITY)
Admission: EM | Admit: 2023-10-21 | Discharge: 2023-10-22 | Disposition: A | Discharge status: Home / Self Care | Attending: Emergency Medicine | Admitting: Emergency Medicine

## 2023-10-21 ENCOUNTER — Other Ambulatory Visit: Payer: Self-pay

## 2023-10-21 ENCOUNTER — Emergency Department (HOSPITAL_COMMUNITY)

## 2023-10-21 ENCOUNTER — Encounter (HOSPITAL_COMMUNITY): Payer: Self-pay | Admitting: Emergency Medicine

## 2023-10-21 DIAGNOSIS — I10 Essential (primary) hypertension: Secondary | ICD-10-CM | POA: Diagnosis not present

## 2023-10-21 DIAGNOSIS — R739 Hyperglycemia, unspecified: Secondary | ICD-10-CM

## 2023-10-21 DIAGNOSIS — R0602 Shortness of breath: Secondary | ICD-10-CM | POA: Diagnosis not present

## 2023-10-21 DIAGNOSIS — E1165 Type 2 diabetes mellitus with hyperglycemia: Secondary | ICD-10-CM | POA: Insufficient documentation

## 2023-10-21 DIAGNOSIS — Z794 Long term (current) use of insulin: Secondary | ICD-10-CM | POA: Diagnosis not present

## 2023-10-21 DIAGNOSIS — F17210 Nicotine dependence, cigarettes, uncomplicated: Secondary | ICD-10-CM | POA: Diagnosis not present

## 2023-10-21 DIAGNOSIS — R0789 Other chest pain: Secondary | ICD-10-CM | POA: Diagnosis not present

## 2023-10-21 DIAGNOSIS — R072 Precordial pain: Secondary | ICD-10-CM

## 2023-10-21 LAB — URINALYSIS, ROUTINE W REFLEX MICROSCOPIC
Bacteria, UA: NONE SEEN
Bilirubin Urine: NEGATIVE
Glucose, UA: >=500 mg/dL — AB
Hgb urine dipstick: NEGATIVE
Ketones, ur: NEGATIVE mg/dL
Leukocytes,Ua: NEGATIVE
Nitrite: NEGATIVE
Protein, ur: NEGATIVE mg/dL
Specific Gravity, Urine: 1.029 (ref 1.005–1.030)
pH: 6 (ref 5.0–8.0)

## 2023-10-21 LAB — I-STAT CHEM 8, ED
BUN: 12 mg/dL (ref 6–20)
Calcium, Ion: 1.16 mmol/L (ref 1.15–1.40)
Chloride: 105 mmol/L (ref 98–111)
Creatinine, Ser: 1.4 mg/dL — ABNORMAL HIGH (ref 0.61–1.24)
Glucose, Bld: 181 mg/dL — ABNORMAL HIGH (ref 70–99)
HCT: 46 % (ref 39.0–52.0)
Hemoglobin: 15.6 g/dL (ref 13.0–17.0)
Potassium: 4.1 mmol/L (ref 3.5–5.1)
Sodium: 138 mmol/L (ref 135–145)
TCO2: 26 mmol/L (ref 22–32)

## 2023-10-21 LAB — BASIC METABOLIC PANEL
Anion gap: 6 (ref 5–15)
BUN: 13 mg/dL (ref 6–20)
CO2: 25 mmol/L (ref 22–32)
Calcium: 8.8 mg/dL — ABNORMAL LOW (ref 8.9–10.3)
Chloride: 104 mmol/L (ref 98–111)
Creatinine, Ser: 1.28 mg/dL — ABNORMAL HIGH (ref 0.61–1.24)
GFR, Estimated: >60 mL/min (ref >60)
Glucose, Bld: 181 mg/dL — ABNORMAL HIGH (ref 70–99)
Potassium: 4.1 mmol/L (ref 3.5–5.1)
Sodium: 135 mmol/L (ref 135–145)

## 2023-10-21 LAB — CBC
HCT: 45.6 % (ref 39.0–52.0)
Hemoglobin: 15.2 g/dL (ref 13.0–17.0)
MCH: 28.3 pg (ref 26.0–34.0)
MCHC: 33.3 g/dL (ref 30.0–36.0)
MCV: 84.9 fL (ref 80.0–100.0)
Platelets: 214 10*3/uL (ref 150–400)
RBC: 5.37 MIL/uL (ref 4.22–5.81)
RDW: 12.4 % (ref 11.5–15.5)
WBC: 5 10*3/uL (ref 4.0–10.5)
nRBC: 0 % (ref 0.0–0.2)

## 2023-10-21 LAB — CBG MONITORING, ED: Glucose-Capillary: 177 mg/dL — ABNORMAL HIGH (ref 70–99)

## 2023-10-21 LAB — TROPONIN I (HIGH SENSITIVITY): Troponin I (High Sensitivity): 6 ng/L (ref <18)

## 2023-10-21 MED ORDER — SODIUM CHLORIDE 0.9 % IV BOLUS
1000.0000 mL | Freq: Once | INTRAVENOUS | Status: AC
  Administered 2023-10-21: 1000 mL via INTRAVENOUS

## 2023-10-21 NOTE — ED Provider Notes (Addendum)
 [place] EMERGENCY DEPARTMENT AT [facility] Provider Note   CSN: [id number] Arrival date & time: 10/21/23  2128     History  Chief Complaint  Patient presents with   Hyperglycemia   Chest Pain   Hypertension    [name] [name] is a 38 y.o. male.  Patient came in with 2 concerns 1 was as blood sugar be elevated and also has concerns about chest pain that occurred approximately 2 hours ago substernal area.  Patient had some nausea vomiting yesterday but none today.  Patient said he had a fever yesterday but temp here is 98.1 pulse 112 respirations 18 blood pressure 147/98 oxygen saturation 99%.  Past medical history significant for diabetic ketoacidosis diabetes hyperlipidemia hypertension.  Patient smokes cigarettes every day.  Blood sugar here was 177 which is reassuring.  Patient seen March 13 for chest pain seen March 2 for hypoglycemia.  Seen February 27 for chest pain seem February 20 for chest pain.       Home Medications Prior to Admission medications   Medication Sig Start Date End Date Taking? Authorizing Provider  blood glucose meter kit and supplies KIT Use to check blood sugar three times daily 09/17/23   Solon Augusta S, PA  Glucose Blood (BLOOD GLUCOSE TEST STRIPS) STRP Use test strip to check blood sugar three times daily 09/17/23   Solon Augusta S, PA  insulin glargine (LANTUS) 100 unit/mL SOPN Inject 25 Units into the skin at bedtime. 09/30/23 09/29/24  Briant Cedar, MD  insulin lispro (HUMALOG) 100 UNIT/ML KwikPen Inject 6 Units into the skin 3 (three) times daily with meals. Only take if eating a meal AND Blood Glucose (BG) is 80 or higher. 09/30/23   Briant Cedar, MD  Insulin Pen Needle (PEN NEEDLES) 31G X 5 MM MISC Use  three times daily 09/17/23   Gailen Shelter, PA  Lancet Device MISC Use 3 (three) times daily. 09/17/23   Gailen Shelter, PA  Lancets MISC Use to check blood sugar three times daily 08/26/23   Danford, Earl Lites, MD   lisinopril (ZESTRIL) 20 MG tablet Take 1 tablet (20 mg total) by mouth daily. 09/30/23 10/30/23  Briant Cedar, MD      Allergies    Patient has no known allergies.    Review of Systems   Review of Systems  Constitutional:  Negative for chills and fever.  HENT:  Negative for ear pain and sore throat.   Eyes:  Negative for pain and visual disturbance.  Respiratory:  Negative for cough and shortness of breath.   Cardiovascular:  Positive for chest pain. Negative for palpitations.  Gastrointestinal:  Positive for nausea and vomiting. Negative for abdominal pain.  Genitourinary:  Negative for dysuria and hematuria.  Musculoskeletal:  Negative for arthralgias and back pain.  Skin:  Negative for color change and rash.  Neurological:  Negative for seizures and syncope.  All other systems reviewed and are negative.   Physical Exam Updated Vital Signs BP (!) 147/98   Pulse (!) 112   Temp 98.1 F (36.7 C) (Oral)   Resp 18   SpO2 99%  Physical Exam Vitals and nursing note reviewed.  Constitutional:      General: He is not in acute distress.    Appearance: He is well-developed.  HENT:     Head: Normocephalic and atraumatic.     Mouth/Throat:     Mouth: Mucous membranes are moist.  Eyes:     Conjunctiva/sclera:  Conjunctivae normal.  Cardiovascular:     Rate and Rhythm: Normal rate and regular rhythm.     Heart sounds: No murmur heard. Pulmonary:     Effort: Pulmonary effort is normal. No respiratory distress.     Breath sounds: Normal breath sounds. No wheezing, rhonchi or rales.  Abdominal:     General: There is no distension.     Palpations: Abdomen is soft.     Tenderness: There is no abdominal tenderness. There is no guarding.  Musculoskeletal:        General: No swelling.     Cervical back: Neck supple.  Skin:    General: Skin is warm and dry.     Capillary Refill: Capillary refill takes less than 2 seconds.  Neurological:     General: No focal deficit present.      Mental Status: He is alert and oriented to person, place, and time.  Psychiatric:        Mood and Affect: Mood normal.     ED Results / Procedures / Treatments   Labs (all labs ordered are listed, but only abnormal results are displayed) Labs Reviewed  CBG MONITORING, ED - Abnormal; Notable for the following components:      Result Value   Glucose-Capillary 177 (*)    All other components within normal limits  I-STAT CHEM 8, ED - Abnormal; Notable for the following components:   Creatinine, Ser 1.40 (*)    Glucose, Bld 181 (*)    All other components within normal limits  CBC  BASIC METABOLIC PANEL  URINALYSIS, ROUTINE W REFLEX MICROSCOPIC  TROPONIN I (HIGH SENSITIVITY)    EKG EKG Interpretation Date/Time:  Tuesday October 21 2023 22:10:14 EDT Ventricular Rate:  98 PR Interval:  131 QRS Duration:  101 QT Interval:  348 QTC Calculation: 445 R Axis:   -23  Text Interpretation: Sinus rhythm Incomplete RBBB and LAFB No significant change since last tracing Confirmed by Vanetta Mulders [id number]) on 10/21/2023 10:21:43 PM  Radiology DG Chest 2 View Result Date: 10/21/2023 CLINICAL DATA:  Shortness of breath EXAM: CHEST - 2 VIEW COMPARISON:  10/09/2023 FINDINGS: The heart size and mediastinal contours are within normal limits. Both lungs are clear. The visualized skeletal structures are unremarkable. IMPRESSION: No active cardiopulmonary disease. Electronically Signed   By: Jasmine Pang M.D.   On: 10/21/2023 21:52    Procedures Procedures    Medications Ordered in ED Medications - No data to display  ED Course/ Medical Decision Making/ A&P                                 Medical Decision Making Amount and/or Complexity of Data Reviewed Labs: ordered. Radiology: ordered.   Patient's urinalysis pending base metabolic panel pending CBC pending initial troponin pending patient's i-STAT Chem-8 blood sugar was 181 the CO2 was 26 which is reassuring.  Electrolytes were  normal.  Fingerstick was 177 2 view chest without any acute cardiopulmonary disease.  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  Patient will need delta troponins.  And we will need patient's bed of basic metabolic panel.  Do not think patient is in DKA.  Will give some fluids.  Urinalysis negative other than some evidence of some glucose.  Basic metabolic panel was reassuring CO2 was 25 which is normal renal function is normal.  Glucose 181.  Electrolytes are normal.  CBC  white count is also normal hemoglobin 15.2 platelets 214.  Initial troponin is 6 but based on the onset of the chest pain this evening will need delta troponin.   Final Clinical Impression(s) / ED Diagnoses Final diagnoses:  Atypical chest pain  Hyperglycemia    Rx / DC Orders ED Discharge Orders     None         Vanetta Mulders, MD 10/21/23 2226    Vanetta Mulders, MD 10/21/23 2252

## 2023-10-21 NOTE — ED Triage Notes (Signed)
 Patient feels weak. He is concerned about his blood sugar and blood pressure. He also complains of chest pain with breathing and is tender to palpation.    Hx: Hypertension and Diabetes  EMS vitals: 188/120 BP 114 P 18 RR 98% SPO2 on room air 266 CBG

## 2023-10-22 LAB — TROPONIN I (HIGH SENSITIVITY): Troponin I (High Sensitivity): 6 ng/L (ref <18)

## 2023-10-29 ENCOUNTER — Ambulatory Visit: Payer: Self-pay | Admitting: Nurse Practitioner

## 2023-11-29 ENCOUNTER — Observation Stay (HOSPITAL_COMMUNITY)
Admission: EM | Admit: 2023-11-29 | Discharge: 2023-11-30 | Disposition: A | Discharge status: Home / Self Care | Attending: Internal Medicine | Admitting: Internal Medicine

## 2023-11-29 ENCOUNTER — Emergency Department (HOSPITAL_COMMUNITY)

## 2023-11-29 ENCOUNTER — Other Ambulatory Visit: Payer: Self-pay

## 2023-11-29 DIAGNOSIS — Z79899 Other long term (current) drug therapy: Secondary | ICD-10-CM | POA: Insufficient documentation

## 2023-11-29 DIAGNOSIS — I10 Essential (primary) hypertension: Secondary | ICD-10-CM | POA: Diagnosis not present

## 2023-11-29 DIAGNOSIS — R7989 Other specified abnormal findings of blood chemistry: Secondary | ICD-10-CM | POA: Diagnosis present

## 2023-11-29 DIAGNOSIS — R4182 Altered mental status, unspecified: Secondary | ICD-10-CM | POA: Diagnosis not present

## 2023-11-29 DIAGNOSIS — E1110 Type 2 diabetes mellitus with ketoacidosis without coma: Secondary | ICD-10-CM | POA: Diagnosis not present

## 2023-11-29 DIAGNOSIS — F17210 Nicotine dependence, cigarettes, uncomplicated: Secondary | ICD-10-CM | POA: Diagnosis not present

## 2023-11-29 DIAGNOSIS — I7389 Other specified peripheral vascular diseases: Principal | ICD-10-CM | POA: Insufficient documentation

## 2023-11-29 DIAGNOSIS — E1100 Type 2 diabetes mellitus with hyperosmolarity without nonketotic hyperglycemic-hyperosmolar coma (NKHHC): Secondary | ICD-10-CM | POA: Diagnosis present

## 2023-11-29 DIAGNOSIS — E1165 Type 2 diabetes mellitus with hyperglycemia: Secondary | ICD-10-CM | POA: Insufficient documentation

## 2023-11-29 DIAGNOSIS — E871 Hypo-osmolality and hyponatremia: Secondary | ICD-10-CM | POA: Diagnosis not present

## 2023-11-29 DIAGNOSIS — E11649 Type 2 diabetes mellitus with hypoglycemia without coma: Secondary | ICD-10-CM | POA: Diagnosis not present

## 2023-11-29 DIAGNOSIS — E875 Hyperkalemia: Secondary | ICD-10-CM | POA: Diagnosis present

## 2023-11-29 DIAGNOSIS — R079 Chest pain, unspecified: Secondary | ICD-10-CM | POA: Diagnosis not present

## 2023-11-29 DIAGNOSIS — F1510 Other stimulant abuse, uncomplicated: Secondary | ICD-10-CM | POA: Diagnosis not present

## 2023-11-29 DIAGNOSIS — N179 Acute kidney failure, unspecified: Secondary | ICD-10-CM | POA: Diagnosis not present

## 2023-11-29 DIAGNOSIS — R0602 Shortness of breath: Secondary | ICD-10-CM | POA: Insufficient documentation

## 2023-11-29 DIAGNOSIS — R739 Hyperglycemia, unspecified: Secondary | ICD-10-CM | POA: Diagnosis not present

## 2023-11-29 DIAGNOSIS — Z794 Long term (current) use of insulin: Secondary | ICD-10-CM | POA: Diagnosis not present

## 2023-11-29 DIAGNOSIS — E1159 Type 2 diabetes mellitus with other circulatory complications: Secondary | ICD-10-CM | POA: Diagnosis present

## 2023-11-29 DIAGNOSIS — R350 Frequency of micturition: Secondary | ICD-10-CM | POA: Diagnosis present

## 2023-11-29 DIAGNOSIS — G9341 Metabolic encephalopathy: Principal | ICD-10-CM | POA: Diagnosis present

## 2023-11-29 DIAGNOSIS — R0789 Other chest pain: Secondary | ICD-10-CM | POA: Diagnosis not present

## 2023-11-29 DIAGNOSIS — I152 Hypertension secondary to endocrine disorders: Secondary | ICD-10-CM | POA: Diagnosis present

## 2023-11-29 DIAGNOSIS — Z720 Tobacco use: Secondary | ICD-10-CM | POA: Diagnosis present

## 2023-11-29 LAB — BASIC METABOLIC PANEL WITH GFR
Anion gap: 11 (ref 5–15)
Anion gap: 13 (ref 5–15)
Anion gap: 7 (ref 5–15)
BUN: 15 mg/dL (ref 6–20)
BUN: 16 mg/dL (ref 6–20)
BUN: 17 mg/dL (ref 6–20)
CO2: 23 mmol/L (ref 22–32)
CO2: 25 mmol/L (ref 22–32)
CO2: 27 mmol/L (ref 22–32)
Calcium: 9.8 mg/dL (ref 8.9–10.3)
Calcium: 9.6 mg/dL (ref 8.9–10.3)
Calcium: 9.1 mg/dL (ref 8.9–10.3)
Chloride: 101 mmol/L (ref 98–111)
Chloride: 99 mmol/L (ref 98–111)
Chloride: 99 mmol/L (ref 98–111)
Creatinine, Ser: 0.91 mg/dL (ref 0.61–1.24)
Creatinine, Ser: 0.92 mg/dL (ref 0.61–1.24)
Creatinine, Ser: 0.91 mg/dL (ref 0.61–1.24)
GFR, Estimated: >60 mL/min (ref >60)
GFR, Estimated: >60 mL/min (ref >60)
GFR, Estimated: >60 mL/min (ref >60)
Glucose, Bld: 161 mg/dL — ABNORMAL HIGH (ref 70–99)
Glucose, Bld: 109 mg/dL — ABNORMAL HIGH (ref 70–99)
Glucose, Bld: 269 mg/dL — ABNORMAL HIGH (ref 70–99)
Potassium: 3.6 mmol/L (ref 3.5–5.1)
Potassium: 3.5 mmol/L (ref 3.5–5.1)
Potassium: 3.7 mmol/L (ref 3.5–5.1)
Sodium: 135 mmol/L (ref 135–145)
Sodium: 137 mmol/L (ref 135–145)
Sodium: 133 mmol/L — ABNORMAL LOW (ref 135–145)

## 2023-11-29 LAB — URINALYSIS, ROUTINE W REFLEX MICROSCOPIC
Bacteria, UA: NONE SEEN
Bilirubin Urine: NEGATIVE
Glucose, UA: >=500 mg/dL — AB
Hgb urine dipstick: NEGATIVE
Ketones, ur: NEGATIVE mg/dL
Leukocytes,Ua: NEGATIVE
Nitrite: NEGATIVE
Protein, ur: NEGATIVE mg/dL
Specific Gravity, Urine: 1.024 (ref 1.005–1.030)
pH: 6 (ref 5.0–8.0)

## 2023-11-29 LAB — BLOOD GAS, VENOUS
Acid-Base Excess: 1.4 mmol/L (ref 0.0–2.0)
Bicarbonate: 25 mmol/L (ref 20.0–28.0)
O2 Saturation: 77.9 %
Patient temperature: 37
pCO2, Ven: 36 mmHg — ABNORMAL LOW (ref 44–60)
pH, Ven: 7.45 — ABNORMAL HIGH (ref 7.25–7.43)
pO2, Ven: 44 mmHg (ref 32–45)

## 2023-11-29 LAB — GLUCOSE, CAPILLARY
Glucose-Capillary: 104 mg/dL — ABNORMAL HIGH (ref 70–99)
Glucose-Capillary: 109 mg/dL — ABNORMAL HIGH (ref 70–99)
Glucose-Capillary: 112 mg/dL — ABNORMAL HIGH (ref 70–99)
Glucose-Capillary: 117 mg/dL — ABNORMAL HIGH (ref 70–99)
Glucose-Capillary: 122 mg/dL — ABNORMAL HIGH (ref 70–99)
Glucose-Capillary: 129 mg/dL — ABNORMAL HIGH (ref 70–99)
Glucose-Capillary: 355 mg/dL — ABNORMAL HIGH (ref 70–99)
Glucose-Capillary: 385 mg/dL — ABNORMAL HIGH (ref 70–99)
Glucose-Capillary: 575 mg/dL (ref 70–99)

## 2023-11-29 LAB — COMPREHENSIVE METABOLIC PANEL WITH GFR
ALT: 20 U/L (ref 0–44)
AST: 20 U/L (ref 15–41)
Albumin: 4.5 g/dL (ref 3.5–5.0)
Alkaline Phosphatase: 89 U/L (ref 38–126)
Anion gap: 12 (ref 5–15)
BUN: 17 mg/dL (ref 6–20)
CO2: 25 mmol/L (ref 22–32)
Calcium: 9.3 mg/dL (ref 8.9–10.3)
Chloride: 85 mmol/L — ABNORMAL LOW (ref 98–111)
Creatinine, Ser: 1.38 mg/dL — ABNORMAL HIGH (ref 0.61–1.24)
GFR, Estimated: >60 mL/min (ref >60)
Glucose, Bld: 1028 mg/dL (ref 70–99)
Potassium: 5.9 mmol/L — ABNORMAL HIGH (ref 3.5–5.1)
Sodium: 122 mmol/L — ABNORMAL LOW (ref 135–145)
Total Bilirubin: 0.9 mg/dL (ref 0.0–1.2)
Total Protein: 8 g/dL (ref 6.5–8.1)

## 2023-11-29 LAB — BETA-HYDROXYBUTYRIC ACID: Beta-Hydroxybutyric Acid: 0.07 mmol/L (ref 0.05–0.27)

## 2023-11-29 LAB — RAPID URINE DRUG SCREEN, HOSP PERFORMED
Amphetamines: POSITIVE — AB
Barbiturates: NOT DETECTED
Benzodiazepines: NOT DETECTED
Cocaine: NOT DETECTED
Opiates: NOT DETECTED
Tetrahydrocannabinol: NOT DETECTED

## 2023-11-29 LAB — CBC WITH DIFFERENTIAL/PLATELET
Abs Immature Granulocytes: 0.01 10*3/uL (ref 0.00–0.07)
Basophils Absolute: 0 10*3/uL (ref 0.0–0.1)
Basophils Relative: 1 %
Eosinophils Absolute: 0.1 10*3/uL (ref 0.0–0.5)
Eosinophils Relative: 2 %
HCT: 47.3 % (ref 39.0–52.0)
Hemoglobin: 15.9 g/dL (ref 13.0–17.0)
Immature Granulocytes: 0 %
Lymphocytes Relative: 34 %
Lymphs Abs: 1.7 10*3/uL (ref 0.7–4.0)
MCH: 28.1 pg (ref 26.0–34.0)
MCHC: 33.6 g/dL (ref 30.0–36.0)
MCV: 83.7 fL (ref 80.0–100.0)
Monocytes Absolute: 0.5 10*3/uL (ref 0.1–1.0)
Monocytes Relative: 9 %
Neutro Abs: 2.7 10*3/uL (ref 1.7–7.7)
Neutrophils Relative %: 54 %
Platelets: 179 10*3/uL (ref 150–400)
RBC: 5.65 MIL/uL (ref 4.22–5.81)
RDW: 12.3 % (ref 11.5–15.5)
WBC: 5 10*3/uL (ref 4.0–10.5)
nRBC: 0 % (ref 0.0–0.2)

## 2023-11-29 LAB — CBG MONITORING, ED
Glucose-Capillary: >600 mg/dL (ref 70–99)
Glucose-Capillary: >600 mg/dL (ref 70–99)

## 2023-11-29 LAB — HEMOGLOBIN A1C
Hgb A1c MFr Bld: 12.9 % — ABNORMAL HIGH (ref 4.8–5.6)
Mean Plasma Glucose: 323.53 mg/dL

## 2023-11-29 LAB — MRSA NEXT GEN BY PCR, NASAL: MRSA by PCR Next Gen: NOT DETECTED

## 2023-11-29 LAB — LIPASE, BLOOD: Lipase: 130 U/L — ABNORMAL HIGH (ref 11–51)

## 2023-11-29 LAB — OSMOLALITY: Osmolality: 301 mosm/kg — ABNORMAL HIGH (ref 275–295)

## 2023-11-29 MED ORDER — LACTATED RINGERS IV SOLN: INTRAVENOUS | Status: DC

## 2023-11-29 MED ORDER — SODIUM CHLORIDE 0.9 % IV BOLUS
1000.0000 mL | Freq: Once | INTRAVENOUS | Status: AC
  Administered 2023-11-29: 1000 mL via INTRAVENOUS

## 2023-11-29 MED ORDER — INSULIN REGULAR(HUMAN) IN NACL 100-0.9 UT/100ML-% IV SOLN
INTRAVENOUS | Status: DC
  Administered 2023-11-29: 15 [IU]/h via INTRAVENOUS
  Filled 2023-11-29: qty 100

## 2023-11-29 MED ORDER — POTASSIUM CHLORIDE CRYS ER 20 MEQ PO TBCR
20.0000 meq | EXTENDED_RELEASE_TABLET | Freq: Once | ORAL | Status: AC
  Administered 2023-11-29: 20 meq via ORAL
  Filled 2023-11-29: qty 1

## 2023-11-29 MED ORDER — ENOXAPARIN SODIUM 40 MG/0.4ML IJ SOSY
40.0000 mg | PREFILLED_SYRINGE | INTRAMUSCULAR | Status: DC
  Administered 2023-11-29: 40 mg via SUBCUTANEOUS
  Filled 2023-11-29: qty 0.4

## 2023-11-29 MED ORDER — DEXTROSE IN LACTATED RINGERS 5 % IV SOLN: INTRAVENOUS | Status: DC

## 2023-11-29 MED ORDER — ONDANSETRON HCL 4 MG PO TABS: 4.0000 mg | ORAL_TABLET | Freq: Four times a day (QID) | ORAL | Status: DC | PRN

## 2023-11-29 MED ORDER — ACETAMINOPHEN 325 MG PO TABS: 650.0000 mg | ORAL_TABLET | Freq: Four times a day (QID) | ORAL | Status: DC | PRN

## 2023-11-29 MED ORDER — NICOTINE 14 MG/24HR TD PT24: 14.0000 mg | MEDICATED_PATCH | Freq: Every day | TRANSDERMAL | Status: DC | PRN

## 2023-11-29 MED ORDER — AMLODIPINE BESYLATE 5 MG PO TABS
5.0000 mg | ORAL_TABLET | Freq: Every day | ORAL | Status: DC
  Administered 2023-11-29 – 2023-11-30 (×2): 5 mg via ORAL
  Filled 2023-11-29 (×2): qty 1

## 2023-11-29 MED ORDER — ONDANSETRON HCL 4 MG/2ML IJ SOLN: 4.0000 mg | Freq: Four times a day (QID) | INTRAMUSCULAR | Status: DC | PRN

## 2023-11-29 MED ORDER — ACETAMINOPHEN 650 MG RE SUPP: 650.0000 mg | Freq: Four times a day (QID) | RECTAL | Status: DC | PRN

## 2023-11-29 MED ORDER — INSULIN ASPART 100 UNIT/ML IJ SOLN
0.0000 [IU] | Freq: Three times a day (TID) | INTRAMUSCULAR | Status: DC
Start: 2023-11-30 — End: 2023-11-30
  Administered 2023-11-29: 2 [IU] via SUBCUTANEOUS
  Administered 2023-11-30: 5 [IU] via SUBCUTANEOUS
  Administered 2023-11-30: 15 [IU] via SUBCUTANEOUS

## 2023-11-29 MED ORDER — DEXTROSE 50 % IV SOLN: 0.0000 mL | INTRAVENOUS | Status: DC | PRN

## 2023-11-29 MED ORDER — INSULIN GLARGINE 100 UNITS/ML SOLOSTAR PEN
25.0000 [IU] | PEN_INJECTOR | Freq: Every day | SUBCUTANEOUS | Status: DC
Start: 2023-11-29 — End: 2023-11-29

## 2023-11-29 MED ORDER — CHLORHEXIDINE GLUCONATE CLOTH 2 % EX PADS
6.0000 | MEDICATED_PAD | Freq: Every day | CUTANEOUS | Status: DC
  Administered 2023-11-29: 6 via TOPICAL

## 2023-11-29 MED ORDER — INSULIN GLARGINE-YFGN 100 UNIT/ML ~~LOC~~ SOLN
25.0000 [IU] | Freq: Every day | SUBCUTANEOUS | Status: DC
Start: 2023-11-29 — End: 2023-11-30
  Administered 2023-11-29: 25 [IU] via SUBCUTANEOUS
  Filled 2023-11-29 (×2): qty 0.25

## 2023-11-29 MED ORDER — LACTATED RINGERS IV BOLUS
20.0000 mL/kg | Freq: Once | INTRAVENOUS | Status: AC
  Administered 2023-11-29: 1000 mL via INTRAVENOUS

## 2023-11-29 NOTE — ED Notes (Signed)
 Andrew Kearns , PA notified and aware of patient glucose 1,028.

## 2023-11-29 NOTE — Progress Notes (Signed)
 Upon admission of this pt this RN found a white powdery substance wrapped in a paper towel in this pts sock. This pt states it is cocaine and was last used 2 days ago. This RN disposed of the white powder in stericycle with Tera Fellows RN.

## 2023-11-29 NOTE — ED Triage Notes (Signed)
 Per EMS pt presents to the ED with complaints of increased thirst and urinary frequency x3 days Pt states that he ran out of his insulin  about 4 days ago.

## 2023-11-29 NOTE — ED Provider Notes (Signed)
 I provided a substantive portion of the care of this patient.  I personally made/approved the management plan for this patient and take responsibility for the patient management. {Remember to document shared critical care using "edcritical" dot phrase:11    38 year old male presents with increased thirst and urinary frequency x 3 days.  Ran out of his insulin .  Patient has evidence of hyperglycemia here with sugar over thousand.  Given IV fluids as well as placed on insulin  drip and will be admitted to the hospitalist team   Lind Repine, MD 11/29/23 1349

## 2023-11-29 NOTE — H&P (Signed)
 History and Physical    Patient: [name] [name] VHQ:[id number] DOB: [birth year] DOA: 11/29/2023 DOS: the patient was seen and examined on 11/29/2023 PCP: Darol Elizabeth, NP  Patient coming from: Home  Chief Complaint:  Chief Complaint  Patient presents with   Urinary Frequency   Hyperglycemia   HPI: [name] is a 38 y.o. male with medical history significant of type 2 diabetes, history of ketoacidosis, history of hyperosmolar state, hyperglycemia, pseudohyponatremia hypertension, hyperlipidemia who presented to the emergency department complaints of polyuria and polydipsia for the past 3 days after he ran out of his insulin  4 days ago. He denied fever, chills, rhinorrhea, sore throat, wheezing or hemoptysis.  No chest pain, palpitations, diaphoresis, PND, orthopnea or pitting edema of the lower extremities.  No abdominal pain, nausea, emesis, diarrhea, constipation, melena or hematochezia.  No flank pain, dysuria, frequency or hematuria.   Lab work: Urinalysis showed glucosuria of greater than 500 mg/dL.  UDS was positive for amphetamines.  CBC was normal.  Venous blood gas showed a pH of 7.45 and decreased pCO2 of 36 mmHg, but all other values were normal.  Lipase was 130.  But hydroxybutyric acid 0.07 mmol/L.  CMP showed a sodium 122, potassium 5.9, chloride 85 and CO2 25 mmol/L with a normal anion gap.  Glucose 1028 and creatinine 1.38 mg/dL.  BUN, calcium  and LFTs were normal.  Imaging: CT head without contrast with no acute intracranial normality.  ED course: Initial vital signs were temperature 98.1 F, pulse 87, respiration 17, BP 175/115 mmHg O2 sat 100% on room air.  He received IV fluids and was started insulin  in the emergency department.   Review of Systems: As mentioned in the history of present illness. All other systems reviewed and are negative. Past Medical History:  Diagnosis Date   Diabetes mellitus without complication (HCC)    DKA (diabetic ketoacidosis) (HCC)  05/08/2022   HTN (hypertension) 05/08/2022   Hyperlipidemia    Hyperosmolar hyperglycemic state (HHS) (HCC) 05/08/2022   Hypertension    Hypoglycemia 07/02/2019   Pseudohyponatremia 05/08/2022   Past Surgical History:  Procedure Laterality Date   HAND SURGERY     Social History:  reports that he has been smoking cigarettes. He has never used smokeless tobacco. He reports current drug use. Drugs: Amphetamines and Marijuana. He reports that he does not drink alcohol.  No Known Allergies  Family History  Family history unknown: Yes    Prior to Admission medications   Medication Sig Start Date End Date Taking? Authorizing Provider  insulin  glargine (LANTUS ) 100 unit/mL SOPN Inject 25 Units into the skin at bedtime. 09/30/23 09/29/24 Yes Ezenduka, Nkeiruka J, MD  insulin  lispro (HUMALOG ) 100 UNIT/ML KwikPen Inject 6 Units into the skin 3 (three) times daily with meals. Only take if eating a meal AND Blood Glucose (BG) is 80 or higher. Patient taking differently: Inject 8 Units into the skin 3 (three) times daily with meals. Only take if eating a meal AND Blood Glucose (BG) is 80 or higher. 09/30/23  Yes Ezenduka, Nkeiruka J, MD  blood glucose meter kit and supplies KIT Use to check blood sugar three times daily 09/17/23   Ofelia Bent S, PA  Glucose Blood (BLOOD GLUCOSE TEST STRIPS) STRP Use test strip to check blood sugar three times daily 09/17/23   Coretta Dexter, PA  Insulin  Pen Needle (PEN NEEDLES) 31G X 5 MM MISC Use  three times daily 09/17/23   Coretta Dexter, PA  Lancet Device  MISC Use 3 (three) times daily. 09/17/23   Coretta Dexter, PA  Lancets MISC Use to check blood sugar three times daily 08/26/23   Danford, Willis Harter, MD  lisinopril  (ZESTRIL ) 20 MG tablet Take 1 tablet (20 mg total) by mouth daily. Patient not taking: Reported on 11/29/2023 09/30/23 10/30/23  Veronica Gordon, MD    Physical Exam: Vitals:   11/29/23 1124 11/29/23 1130 11/29/23 1300  BP: (!) 175/115 (!)  168/109   Pulse: 87 89   Resp: 17    Temp: 98.1 F (36.7 C)    TempSrc: Oral    SpO2: 100% 98%   Weight:   93 kg   Physical Exam Vitals reviewed.  Constitutional:      General: He is awake. He is not in acute distress.    Appearance: He is ill-appearing.  HENT:     Head: Normocephalic.     Nose: No rhinorrhea.     Mouth/Throat:     Mouth: Mucous membranes are dry.  Eyes:     General: No scleral icterus.    Conjunctiva/sclera:     Right eye: Right conjunctiva is injected.     Left eye: Left conjunctiva is injected.     Pupils: Pupils are equal, round, and reactive to light.  Neck:     Vascular: No JVD.  Cardiovascular:     Rate and Rhythm: Regular rhythm. Tachycardia present.     Heart sounds: S1 normal and S2 normal.  Pulmonary:     Effort: Pulmonary effort is normal.     Breath sounds: Normal breath sounds. No wheezing, rhonchi or rales.  Abdominal:     General: Bowel sounds are normal. There is no distension.     Palpations: Abdomen is soft.     Tenderness: There is no abdominal tenderness. There is no guarding.  Musculoskeletal:     Cervical back: Neck supple.     Right lower leg: No edema.     Left lower leg: No edema.  Skin:    General: Skin is warm and dry.  Neurological:     General: No focal deficit present.     Mental Status: He is alert and oriented to person, place, and time.  Psychiatric:        Mood and Affect: Mood normal.        Behavior: Behavior normal. Behavior is cooperative.     Data Reviewed:  Results are pending, will review when available.  Assessment and Plan: Principal Problem:   Acute metabolic encephalopathy In the setting of:   Hyperosmolar non-ketotic state due to type 2 diabetes mellitus (HCC) Associated with:   Pseudohyponatremia   Hyperkalemia Observation/stepdown. Keep NPO. Continue IV fluids. Continue insulin  infusion. Monitor CBG closely. BMP every 4 hours. BHA was normal. Check osmolality.. Replace  electrolytes as needed. Consult diabetes coordinator. Transition to SQ insulin  per Endo tool.  Active Problems:   AKI (acute kidney injury) (HCC) Observation/telemetry. Continue IV fluids. Hold ARB/ACE. Avoid hypotension. Avoid nephrotoxins. Monitor intake and output. Monitor renal function electrolytes.    Hypertension associated with diabetes (HCC) Holding lisinopril  due to mild AKI. Will use amlodipine  for now.    Amphetamine abuse (HCC) Cessation advised.    Tobacco abuse Tobacco cessation advised. Nicotine  replacement therapy ordered as needed.    Advance Care Planning:   Code Status: Full Code   Consults:   Family Communication:   Severity of Illness: The appropriate patient status for this patient is INPATIENT. Inpatient status is judged  to be reasonable and necessary in order to provide the required intensity of service to ensure the patient's safety. The patient's presenting symptoms, physical exam findings, and initial radiographic and laboratory data in the context of their chronic comorbidities is felt to place them at high risk for further clinical deterioration. Furthermore, it is not anticipated that the patient will be medically stable for discharge from the hospital within 2 midnights of admission.   * I certify that at the point of admission it is my clinical judgment that the patient will require inpatient hospital care spanning beyond 2 midnights from the point of admission due to high intensity of service, high risk for further deterioration and high frequency of surveillance required.*  Author: Danice Dural, MD 11/29/2023 1:48 PM  For on call review [email or web address].   This document was prepared using Dragon voice recognition software and may contain some unintended transcription errors.

## 2023-11-29 NOTE — ED Provider Notes (Signed)
 [place] EMERGENCY DEPARTMENT AT [facility] Provider Note   CSN: [id number] Arrival date & time: 11/29/23  1057     History  Chief Complaint  Patient presents with   Urinary Frequency   Hyperglycemia    [name] [name] is a 38 y.o. male.  With past medical history of type 2 diabetes, history of HHS, tobacco use, substance use, amphetamine use presenting to emergency room with complaint of running out of insulin  4 days ago.  He reports that he has been sleepy, has had increased thirst and increased urinary frequency.  He denies any chest pain, shortness of breath abdominal pain nausea vomiting or diarrhea.   Urinary Frequency  Hyperglycemia      Home Medications Prior to Admission medications   Medication Sig Start Date End Date Taking? Authorizing Provider  blood glucose meter kit and supplies KIT Use to check blood sugar three times daily 09/17/23   Ofelia Bent S, PA  Glucose Blood (BLOOD GLUCOSE TEST STRIPS) STRP Use test strip to check blood sugar three times daily 09/17/23   Ofelia Bent S, PA  insulin  glargine (LANTUS ) 100 unit/mL SOPN Inject 25 Units into the skin at bedtime. 09/30/23 09/29/24  Ezenduka, Nkeiruka J, MD  insulin  lispro (HUMALOG ) 100 UNIT/ML KwikPen Inject 6 Units into the skin 3 (three) times daily with meals. Only take if eating a meal AND Blood Glucose (BG) is 80 or higher. 09/30/23   Ezenduka, Nkeiruka J, MD  Insulin  Pen Needle (PEN NEEDLES) 31G X 5 MM MISC Use  three times daily 09/17/23   Coretta Dexter, PA  Lancet Device MISC Use 3 (three) times daily. 09/17/23   Coretta Dexter, PA  Lancets MISC Use to check blood sugar three times daily 08/26/23   Danford, Willis Harter, MD  lisinopril  (ZESTRIL ) 20 MG tablet Take 1 tablet (20 mg total) by mouth daily. 09/30/23 10/30/23  Ezenduka, Nkeiruka J, MD      Allergies    Patient has no known allergies.    Review of Systems   Review of Systems  Genitourinary:  Positive for frequency.    Physical  Exam Updated Vital Signs BP (!) 168/109   Pulse 89   Temp 98.1 F (36.7 C) (Oral)   Resp 17   SpO2 98%  Physical Exam Vitals and nursing note reviewed.  Constitutional:      General: He is not in acute distress.    Appearance: He is not toxic-appearing.     Comments: Drowsy but easily awoken.   HENT:     Head: Normocephalic and atraumatic.  Eyes:     General: No scleral icterus.    Conjunctiva/sclera: Conjunctivae normal.  Cardiovascular:     Rate and Rhythm: Normal rate and regular rhythm.     Pulses: Normal pulses.     Heart sounds: Normal heart sounds.  Pulmonary:     Effort: Pulmonary effort is normal. No respiratory distress.     Breath sounds: Normal breath sounds.  Abdominal:     General: Abdomen is flat. Bowel sounds are normal.     Palpations: Abdomen is soft.     Tenderness: There is no abdominal tenderness.  Musculoskeletal:     Right lower leg: No edema.     Left lower leg: No edema.  Skin:    General: Skin is warm and dry.     Findings: No lesion.  Neurological:     General: No focal deficit present.     Mental Status:  He is alert and oriented to person, place, and time. Mental status is at baseline.     ED Results / Procedures / Treatments   Labs (all labs ordered are listed, but only abnormal results are displayed) Labs Reviewed  COMPREHENSIVE METABOLIC PANEL WITH GFR - Abnormal; Notable for the following components:      Result Value   Sodium 122 (*)    Potassium 5.9 (*)    Chloride 85 (*)    Glucose, Bld 1,028 (*)    Creatinine, Ser 1.38 (*)    All other components within normal limits  LIPASE, BLOOD - Abnormal; Notable for the following components:   Lipase 130 (*)    All other components within normal limits  URINALYSIS, ROUTINE W REFLEX MICROSCOPIC - Abnormal; Notable for the following components:   Color, Urine COLORLESS (*)    Glucose, UA >=500 (*)    All other components within normal limits  RAPID URINE DRUG SCREEN, HOSP PERFORMED -  Abnormal; Notable for the following components:   Amphetamines POSITIVE (*)    All other components within normal limits  BLOOD GAS, VENOUS - Abnormal; Notable for the following components:   pH, Ven 7.45 (*)    pCO2, Ven 36 (*)    All other components within normal limits  CBG MONITORING, ED - Abnormal; Notable for the following components:   Glucose-Capillary >600 (*)    All other components within normal limits  CBC WITH DIFFERENTIAL/PLATELET  BETA-HYDROXYBUTYRIC ACID  OSMOLALITY  I-STAT VENOUS BLOOD GAS, ED  CBG MONITORING, ED    EKG None  Radiology CT Head Wo Contrast Result Date: 11/29/2023 CLINICAL DATA:  Mental status change. EXAM: CT HEAD WITHOUT CONTRAST TECHNIQUE: Contiguous axial images were obtained from the base of the skull through the vertex without intravenous contrast. RADIATION DOSE REDUCTION: This exam was performed according to the departmental dose-optimization program which includes automated exposure control, adjustment of the mA and/or kV according to patient size and/or use of iterative reconstruction technique. COMPARISON:  None Available.  05/07/2023 FINDINGS: Brain: There is no evidence for acute hemorrhage, hydrocephalus, mass lesion, or abnormal extra-axial fluid collection. No definite CT evidence for acute infarction. Vascular: No hyperdense vessel or unexpected calcification. Skull: No evidence for fracture. No worrisome lytic or sclerotic lesion. Sinuses/Orbits: The visualized paranasal sinuses and mastoid air cells are clear. Visualized portions of the globes and intraorbital fat are unremarkable. Other: None. IMPRESSION: No acute intracranial abnormality. Electronically Signed   By: Donnal Fusi M.D.   On: 11/29/2023 13:11    Procedures .Critical Care  Performed by: Eudora Heron, PA-C Authorized by: Eudora Heron, PA-C   Critical care provider statement:    Critical care time (minutes):  40   Critical care time was exclusive of:  Separately  billable procedures and treating other patients   Critical care was necessary to treat or prevent imminent or life-threatening deterioration of the following conditions:  Circulatory failure and endocrine crisis   Critical care was time spent personally by me on the following activities:  Blood draw for specimens, development of treatment plan with patient or surrogate, discussions with consultants, evaluation of patient's response to treatment, review of old charts, re-evaluation of patient's condition, pulse oximetry, ordering and review of laboratory studies and ordering and performing treatments and interventions   I assumed direction of critical care for this patient from another provider in my specialty: yes     Care discussed with: admitting provider  Medications Ordered in ED Medications  lactated ringers  bolus 1,860 mL (has no administration in time range)  insulin  regular, human (MYXREDLIN ) 100 units/ 100 mL infusion (has no administration in time range)  lactated ringers  infusion (has no administration in time range)  dextrose  5 % in lactated ringers  infusion (has no administration in time range)  dextrose  50 % solution 0-50 mL (has no administration in time range)  sodium chloride  0.9 % bolus 1,000 mL (1,000 mLs Intravenous New Bag/Given 11/29/23 1224)    ED Course/ Medical Decision Making/ A&P                                 Medical Decision Making Amount and/or Complexity of Data Reviewed Labs: ordered. Radiology: ordered.  Risk Prescription drug management. Decision regarding hospitalization.   This patient presents to the ED for concern of hyper glycemia, this involves an extensive number of treatment options, and is a complaint that carries with it a high risk of complications and morbidity.  The differential diagnosis includes glycemia, DKA, HHS, electrolyte abnormality, dehydration, intracranial abnormality   Co morbidities that complicate the patient  evaluation  DM2 Drug use   Additional history obtained:  Additional history obtained from was recently admitted 09/28/2023 for hypoglycemia.   Lab Tests:  I personally interpreted labs.  The pertinent results include:   CBC without leukocytosis and no significant anemia.  UA shows glucose but no infection.  VBG with pH of 7.45. Beta hydroxy is 0.07.  Sodium is 122, potassium is 5.9, chloride is 85, creatinine 1.38 and glucose is 1000 without significant anion gap.  Osmolality pending.  Lipase elevated at 130   Imaging Studies ordered:  I ordered imaging studies including CT head for AMS  I independently visualized and interpreted imaging which showed negative I agree with the radiologist interpretation   Cardiac Monitoring: / EKG:  The patient was maintained on a cardiac monitor.     Consultations Obtained:  I requested consultation with the hospitalist,  and discussed lab and imaging findings as well as pertinent plan - they recommend: admission.    Problem List / ED Course / Critical interventions / Medication management  Reporting to emergency room with complaint of being out of his insulin  for 4 days.  He reports that he ran out of the medicine and he has not been able to take it.  Reports has had some increased thirst, generalized weakness and urinary frequency but otherwise denies any associated symptoms or pain.  He is drowsy on exam but alert and oriented.  He has no slurred speech.  He is moving upper and lower extremities without any difficulty.  He is lungs clear to auscultation bilaterally and no focal area of abdominal tenderness.  Will check labs including beta hydroxy uric acid, osmolality given hyperglycemia.  His CBG is over 600. Patient's labs consistent with HHS.  He is drowsy but maintaining airway.  His vitals are stable.  Will start IV insulin  and give additional 2 L bolus. Will admit. Patient agrees.  I ordered medication including NS Reevaluation of the  patient after these medicines showed that the patient stayed the same I have reviewed the patients home medicines and have made adjustments as needed   Plan Admit HHS         Final Clinical Impression(s) / ED Diagnoses Final diagnoses:  HHS (hypothenar hammer syndrome) (HCC)    Rx / DC Orders ED Discharge  Orders     None         Eudora Heron, PA-C 11/29/23 1410    Lind Repine, MD 11/30/23 [phone]

## 2023-11-30 DIAGNOSIS — E1110 Type 2 diabetes mellitus with ketoacidosis without coma: Secondary | ICD-10-CM | POA: Diagnosis present

## 2023-11-30 DIAGNOSIS — E1100 Type 2 diabetes mellitus with hyperosmolarity without nonketotic hyperglycemic-hyperosmolar coma (NKHHC): Secondary | ICD-10-CM | POA: Diagnosis not present

## 2023-11-30 LAB — CBC
HCT: 48.3 % (ref 39.0–52.0)
Hemoglobin: 16.9 g/dL (ref 13.0–17.0)
MCH: 28.6 pg (ref 26.0–34.0)
MCHC: 35 g/dL (ref 30.0–36.0)
MCV: 81.7 fL (ref 80.0–100.0)
Platelets: 199 10*3/uL (ref 150–400)
RBC: 5.91 MIL/uL — ABNORMAL HIGH (ref 4.22–5.81)
RDW: 12.7 % (ref 11.5–15.5)
WBC: 7.6 10*3/uL (ref 4.0–10.5)
nRBC: 0 % (ref 0.0–0.2)

## 2023-11-30 LAB — COMPREHENSIVE METABOLIC PANEL WITH GFR
ALT: 17 U/L (ref 0–44)
AST: 17 U/L (ref 15–41)
Albumin: 3.6 g/dL (ref 3.5–5.0)
Alkaline Phosphatase: 76 U/L (ref 38–126)
Anion gap: 11 (ref 5–15)
BUN: 16 mg/dL (ref 6–20)
CO2: 23 mmol/L (ref 22–32)
Calcium: 9.1 mg/dL (ref 8.9–10.3)
Chloride: 101 mmol/L (ref 98–111)
Creatinine, Ser: 0.92 mg/dL (ref 0.61–1.24)
GFR, Estimated: >60 mL/min (ref >60)
Glucose, Bld: 90 mg/dL (ref 70–99)
Potassium: 3.6 mmol/L (ref 3.5–5.1)
Sodium: 135 mmol/L (ref 135–145)
Total Bilirubin: 0.9 mg/dL (ref 0.0–1.2)
Total Protein: 7 g/dL (ref 6.5–8.1)

## 2023-11-30 LAB — GLUCOSE, CAPILLARY
Glucose-Capillary: 73 mg/dL (ref 70–99)
Glucose-Capillary: 88 mg/dL (ref 70–99)
Glucose-Capillary: 152 mg/dL — ABNORMAL HIGH (ref 70–99)
Glucose-Capillary: 232 mg/dL — ABNORMAL HIGH (ref 70–99)
Glucose-Capillary: 422 mg/dL — ABNORMAL HIGH (ref 70–99)

## 2023-11-30 MED ORDER — INSULIN LISPRO (1 UNIT DIAL) 100 UNIT/ML (KWIKPEN)
8.0000 [IU] | PEN_INJECTOR | Freq: Three times a day (TID) | SUBCUTANEOUS | 2 refills | Status: DC
Start: 2023-11-30 — End: 2023-11-30

## 2023-11-30 MED ORDER — INSULIN ASPART 100 UNIT/ML IJ SOLN
4.0000 [IU] | Freq: Three times a day (TID) | INTRAMUSCULAR | Status: DC
  Administered 2023-11-30: 4 [IU] via SUBCUTANEOUS

## 2023-11-30 MED ORDER — INSULIN LISPRO (1 UNIT DIAL) 100 UNIT/ML (KWIKPEN)
8.0000 [IU] | PEN_INJECTOR | Freq: Three times a day (TID) | SUBCUTANEOUS | 2 refills | Status: DC
Start: 2023-11-30 — End: 2023-12-14

## 2023-11-30 MED ORDER — AMLODIPINE BESYLATE 5 MG PO TABS: 5.0000 mg | ORAL_TABLET | Freq: Every day | ORAL | 0 refills | Status: DC

## 2023-11-30 MED ORDER — INSULIN ASPART 100 UNIT/ML IJ SOLN
7.0000 [IU] | Freq: Once | INTRAMUSCULAR | Status: AC
  Administered 2023-11-30: 7 [IU] via SUBCUTANEOUS

## 2023-11-30 MED ORDER — INSULIN GLARGINE 100 UNITS/ML SOLOSTAR PEN: 25.0000 [IU] | PEN_INJECTOR | Freq: Every day | SUBCUTANEOUS | 0 refills | Status: DC

## 2023-11-30 MED ORDER — ONDANSETRON HCL 4 MG PO TABS: 4.0000 mg | ORAL_TABLET | Freq: Four times a day (QID) | ORAL | 0 refills | Status: DC | PRN

## 2023-11-30 NOTE — Plan of Care (Signed)

## 2023-11-30 NOTE — Inpatient Diabetes Management (Signed)
 Inpatient Diabetes Program Recommendations  AACE/ADA: New Consensus Statement on Inpatient Glycemic Control (2015)  Target Ranges:  Prepandial:   less than 140 mg/dL      Peak postprandial:   less than 180 mg/dL (1-2 hours)      Critically ill patients:  140 - 180 mg/dL   Lab Results  Component Value Date   GLUCAP 232 (H) 11/30/2023   HGBA1C 12.9 (H) 11/29/2023    Diabetes history: DM Outpatient Diabetes medications: Lantus  25 units daily, Humalog  8 units tid meal coverage Current orders for Inpatient glycemic control: Semglee  25 units daily, Novolog  0-15 units tid   Inpatient Diabetes Program Recommendations:   Patient was just her in the hospital 09-30-23 and DM spoke with patient regarding need to take basal insulin  and reviewed sick day rules. According to notes, patient did not take insulin  this admission x 4 days.  -Add Novolog  4 units tid meal coverage if eats 50% meals  Will follow while hospitalized.  Thank you, Bridget Ravanna E. Yennifer Segovia, RN, MSN, CDCES  Diabetes Coordinator Inpatient Glycemic Control Team Team Pager [phone] (8am-5pm) 11/30/2023 8:53 AM

## 2023-11-30 NOTE — TOC Transition Note (Signed)
 Transition of Care [facility]) - Discharge Note   Patient Details  Name: [name] [name] MRN: [id number] Date of Birth: Nov 12, 1985  Transition of Care [facility]) CM/SW Contact:  Gertha Ku, LCSW Phone Number: 11/30/2023, 10:17 AM   Clinical Narrative:     CSW received consult for medication assistance. Pt is insured with Medicaid and does not qualify for the [facility] program.         Patient Goals and CMS Choice            Discharge Placement                       Discharge Plan and Services Additional resources added to the After Visit Summary for                                       Social Drivers of Health (SDOH) Interventions SDOH Screenings   Food Insecurity: No Food Insecurity (11/29/2023)  Recent Concern: Food Insecurity - Food Insecurity Present (09/29/2023)  Housing: Patient Declined (11/29/2023)  Transportation Needs: Unmet Transportation Needs (11/29/2023)  Utilities: Patient Declined (11/29/2023)  Tobacco Use: High Risk (10/21/2023)     Readmission Risk Interventions    09/30/2023   11:37 AM 07/21/2023   12:30 PM 07/04/2023   12:49 PM  Readmission Risk Prevention Plan  Transportation Screening Complete Complete Complete  Medication Review [employer]) Complete Complete Complete  PCP or Specialist appointment within 3-5 days of discharge  Complete Complete  HRI or Home Care Consult Complete Complete Complete  HRI or Home Care Consult Pt Refusal Comments  N/A   SW Recovery Care/Counseling Consult Complete Complete Complete  Palliative Care Screening Not Applicable Not Applicable Not Applicable  Skilled Nursing Facility Not Applicable Not Applicable Not Applicable

## 2023-11-30 NOTE — Discharge Summary (Addendum)
 Physician Discharge Summary  [name] [name] YQM:[id number] DOB: 1985/12/28 DOA: 11/29/2023  PCP: Darol Elizabeth, NP  Admit date: 11/29/2023 Discharge date: 11/30/2023  Admitted From: Home Disposition: Home  Recommendations for Outpatient Follow-up:  Follow up with PCP in 1 week with repeat CBC/BMP Compliant with medications and follow-up Follow up in ED if symptoms worsen or new appear   Home Health: No Equipment/Devices: None  Discharge Condition: Stable CODE STATUS: Full Diet recommendation: Heart healthy  Brief/Interim Summary: 38 year old male with history of diabetes mellitus type 2, DKA, HHS, hypertension, hyperlipidemia, noncompliance with medications presented with urinary frequency and hyperglycemia after he ran out of his insulin  4 days ago.  On presentation, UDS was positive for amphetamines, venous blood gas showed pH of 7.45, sodium of 122, potassium 5.9, normal anion gap, glucose of 1028, creatinine 1.38.  CT of head without contrast was negative for any acute intracranial abnormality.  He was started on IV fluids and insulin  drip.  Subsequently, his condition has improved.  Blood sugars have improved and he has already been transitioned to long-acting insulin .  Electrolytes have normalized.  He is currently tolerating diet.  He needs to be compliant with medications and follow-up.  TOC has been consulted.  He will be discharged today with outpatient follow-up with PCP.  Discharge Diagnoses:   Hyperosmolar nonketotic state due to diabetes mellitus type 2 uncontrolled with hyperglycemia Acute metabolic encephalopathy Pseudohyponatremia Hyperkalemia Acute kidney injury -Presented with extremely high blood sugars after he ran out of his insulin  4 days ago.  On presentation, UDS was positive for amphetamines, venous blood gas showed pH of 7.45, sodium of 122, potassium 5.9, normal anion gap, glucose of 1028, creatinine 1.38.  CT of head without contrast was negative for any  acute intracranial abnormality.  He was started on IV fluids and insulin  drip.  Subsequently, his condition has improved.  Blood sugars have improved and he has already been transitioned to long-acting insulin .  Electrolytes have normalized.  He is currently tolerating diet.  He needs to be compliant with medications and follow-up.  TOC has been consulted.  He will be discharged today with long-acting and short acting insulin  with outpatient follow-up with PCP. - Diabetes coordinator follow-up appreciated.  Hypertension - Patient has been started on amlodipine  which will be continued on discharge.  Patient apparently has not taken lisinopril  as an outpatient  Amphetamine abuse Tobacco abuse -Cessation advised by admitting hospitalist  Discharge Instructions  Discharge Instructions     Diet - low sodium heart healthy   Complete by: As directed    Diet Carb Modified   Complete by: As directed    Increase activity slowly   Complete by: As directed       Allergies as of 11/30/2023   No Known Allergies      Medication List     STOP taking these medications    lisinopril  20 MG tablet Commonly known as: ZESTRIL        TAKE these medications    Accu-Chek Softclix Lancets lancets Use to check blood sugar three times daily   amLODipine  5 MG tablet Commonly known as: NORVASC  Take 1 tablet (5 mg total) by mouth daily. Start taking on: Dec 01, 2023   blood glucose meter kit and supplies Kit Use to check blood sugar three times daily   BLOOD GLUCOSE TEST STRIPS Strp Use test strip to check blood sugar three times daily   insulin  glargine 100 unit/mL Sopn Commonly known as: LANTUS  Inject 25  Units into the skin at bedtime.   insulin  lispro 100 UNIT/ML KwikPen Commonly known as: HUMALOG  Inject 8 Units into the skin 3 (three) times daily with meals. Only take if eating a meal AND Blood Glucose (BG) is 80 or higher.   Lancet Device Misc Use 3 (three) times daily.    ondansetron  4 MG tablet Commonly known as: ZOFRAN  Take 1 tablet (4 mg total) by mouth every 6 (six) hours as needed for nausea.   Pen Needles 31G X 5 MM Misc Use  three times daily          Follow-up Information     Placey, Kevon Pellegrini, NP. Schedule an appointment as soon as possible for a visit in 1 week(s).   Contact information: [address]  [place] [address] [phone]                No Known Allergies  Consultations: None   Procedures/Studies: CT Head Wo Contrast Result Date: 11/29/2023 CLINICAL DATA:  Mental status change. EXAM: CT HEAD WITHOUT CONTRAST TECHNIQUE: Contiguous axial images were obtained from the base of the skull through the vertex without intravenous contrast. RADIATION DOSE REDUCTION: This exam was performed according to the departmental dose-optimization program which includes automated exposure control, adjustment of the mA and/or kV according to patient size and/or use of iterative reconstruction technique. COMPARISON:  None Available.  05/07/2023 FINDINGS: Brain: There is no evidence for acute hemorrhage, hydrocephalus, mass lesion, or abnormal extra-axial fluid collection. No definite CT evidence for acute infarction. Vascular: No hyperdense vessel or unexpected calcification. Skull: No evidence for fracture. No worrisome lytic or sclerotic lesion. Sinuses/Orbits: The visualized paranasal sinuses and mastoid air cells are clear. Visualized portions of the globes and intraorbital fat are unremarkable. Other: None. IMPRESSION: No acute intracranial abnormality. Electronically Signed   By: Donnal Fusi M.D.   On: 11/29/2023 13:11      Subjective: Patient seen and examined at bedside.  Feels better.  Tolerating diet.  Poor historian.  No fever, seizures, vomiting reported.  Discharge Exam: Vitals:   11/30/23 0401 11/30/23 0825  BP: (!) 142/94   Pulse:    Resp: 14   Temp:  98.6 F (37 C)  SpO2:      General: Pt is alert, awake,  not in acute distress.  Looks chronically ill and deconditioned.  Slow to respond.  Flat affect. Cardiovascular: rate controlled, S1/S2 + Respiratory: bilateral decreased breath sounds at bases Abdominal: Soft, NT, ND, bowel sounds + Extremities: no edema, no cyanosis    The results of significant diagnostics from this hospitalization (including imaging, microbiology, ancillary and laboratory) are listed below for reference.     Microbiology: Recent Results (from the past 240 hours)  MRSA Next Gen by PCR, Nasal     Status: None   Collection Time: 11/29/23  4:00 PM   Specimen: Nasal Mucosa; Nasal Swab  Result Value Ref Range Status   MRSA by PCR Next Gen NOT DETECTED NOT DETECTED Final    Comment: (NOTE) The GeneXpert MRSA Assay (FDA approved for NASAL specimens only), is one component of a comprehensive MRSA colonization surveillance program. It is not intended to diagnose MRSA infection nor to guide or monitor treatment for MRSA infections. Test performance is not FDA approved in patients less than 26 years old. Performed at [facility], [address], [place], [place] [address]      Labs: BNP (last 3 results) No results for input(s): "BNP" in the last 8760  hours. Basic Metabolic Panel: Recent Labs  Lab 11/29/23 1227 11/29/23 1537 11/29/23 1938 11/29/23 2310 11/30/23 0309  NA 122* 135 137 133* 135  K 5.9* 3.6 3.5 3.7 3.6  CL 85* 101 99 99 101  CO2 25 23 25 27 23   GLUCOSE 1,028* 161* 109* 269* 90  BUN 17 15 16 17 16   CREATININE 1.38* 0.91 0.92 0.91 0.92  CALCIUM  9.3 9.8 9.6 9.1 9.1   Liver Function Tests: Recent Labs  Lab 11/29/23 1227 11/30/23 0309  AST 20 17  ALT 20 17  ALKPHOS 89 76  BILITOT 0.9 0.9  PROT 8.0 7.0  ALBUMIN 4.5 3.6   Recent Labs  Lab 11/29/23 1227  LIPASE 130*   No results for input(s): "AMMONIA" in the last 168 hours. CBC: Recent Labs  Lab 11/29/23 1227 11/30/23 0309  WBC 5.0 7.6  NEUTROABS 2.7  --   HGB  15.9 16.9  HCT 47.3 48.3  MCV 83.7 81.7  PLT 179 199   Cardiac Enzymes: No results for input(s): "CKTOTAL", "CKMB", "CKMBINDEX", "TROPONINI" in the last 168 hours. BNP: Invalid input(s): "POCBNP" CBG: Recent Labs  Lab 11/29/23 2350 11/30/23 0250 11/30/23 0308 11/30/23 0345 11/30/23 0744  GLUCAP 385* 73 88 152* 232*   D-Dimer No results for input(s): "DDIMER" in the last 72 hours. Hgb A1c Recent Labs    11/29/23 1537  HGBA1C 12.9*   Lipid Profile No results for input(s): "CHOL", "HDL", "LDLCALC", "TRIG", "CHOLHDL", "LDLDIRECT" in the last 72 hours. Thyroid  function studies No results for input(s): "TSH", "T4TOTAL", "T3FREE", "THYROIDAB" in the last 72 hours.  Invalid input(s): "FREET3" Anemia work up No results for input(s): "VITAMINB12", "FOLATE", "FERRITIN", "TIBC", "IRON", "RETICCTPCT" in the last 72 hours. Urinalysis    Component Value Date/Time   COLORURINE COLORLESS (A) 11/29/2023 1225   APPEARANCEUR CLEAR 11/29/2023 1225   LABSPEC 1.024 11/29/2023 1225   PHURINE 6.0 11/29/2023 1225   GLUCOSEU >=500 (A) 11/29/2023 1225   HGBUR NEGATIVE 11/29/2023 1225   BILIRUBINUR NEGATIVE 11/29/2023 1225   KETONESUR NEGATIVE 11/29/2023 1225   PROTEINUR NEGATIVE 11/29/2023 1225   NITRITE NEGATIVE 11/29/2023 1225   LEUKOCYTESUR NEGATIVE 11/29/2023 1225   Sepsis Labs Recent Labs  Lab 11/29/23 1227 11/30/23 0309  WBC 5.0 7.6   Microbiology Recent Results (from the past 240 hours)  MRSA Next Gen by PCR, Nasal     Status: None   Collection Time: 11/29/23  4:00 PM   Specimen: Nasal Mucosa; Nasal Swab  Result Value Ref Range Status   MRSA by PCR Next Gen NOT DETECTED NOT DETECTED Final    Comment: (NOTE) The GeneXpert MRSA Assay (FDA approved for NASAL specimens only), is one component of a comprehensive MRSA colonization surveillance program. It is not intended to diagnose MRSA infection nor to guide or monitor treatment for MRSA infections. Test performance is not  FDA approved in patients less than 78 years old. Performed at [facility], [address] [place], [place] [address]      Time coordinating discharge: 35 minutes  SIGNED:   Audria Leather, MD  [facility] 11/30/2023, 10:00 AM

## 2023-12-11 ENCOUNTER — Other Ambulatory Visit: Payer: Self-pay

## 2023-12-11 ENCOUNTER — Encounter (HOSPITAL_COMMUNITY): Payer: Self-pay | Admitting: Emergency Medicine

## 2023-12-11 ENCOUNTER — Inpatient Hospital Stay (HOSPITAL_COMMUNITY)
Admission: EM | Admit: 2023-12-11 | Discharge: 2023-12-14 | DRG: 637 | Discharge status: Against Medical Advice | Attending: Family Medicine | Admitting: Family Medicine

## 2023-12-11 DIAGNOSIS — E669 Obesity, unspecified: Secondary | ICD-10-CM | POA: Diagnosis present

## 2023-12-11 DIAGNOSIS — E1100 Type 2 diabetes mellitus with hyperosmolarity without nonketotic hyperglycemic-hyperosmolar coma (NKHHC): Principal | ICD-10-CM | POA: Diagnosis present

## 2023-12-11 DIAGNOSIS — R110 Nausea: Secondary | ICD-10-CM | POA: Diagnosis not present

## 2023-12-11 DIAGNOSIS — F17210 Nicotine dependence, cigarettes, uncomplicated: Secondary | ICD-10-CM | POA: Diagnosis present

## 2023-12-11 DIAGNOSIS — E1165 Type 2 diabetes mellitus with hyperglycemia: Secondary | ICD-10-CM | POA: Diagnosis not present

## 2023-12-11 DIAGNOSIS — Z794 Long term (current) use of insulin: Secondary | ICD-10-CM

## 2023-12-11 DIAGNOSIS — I152 Hypertension secondary to endocrine disorders: Secondary | ICD-10-CM | POA: Diagnosis present

## 2023-12-11 DIAGNOSIS — R55 Syncope and collapse: Secondary | ICD-10-CM | POA: Diagnosis present

## 2023-12-11 DIAGNOSIS — Z79899 Other long term (current) drug therapy: Secondary | ICD-10-CM

## 2023-12-11 DIAGNOSIS — Z5329 Procedure and treatment not carried out because of patient's decision for other reasons: Secondary | ICD-10-CM | POA: Diagnosis present

## 2023-12-11 DIAGNOSIS — E785 Hyperlipidemia, unspecified: Secondary | ICD-10-CM | POA: Diagnosis present

## 2023-12-11 DIAGNOSIS — R000 Tachycardia, unspecified: Secondary | ICD-10-CM | POA: Diagnosis not present

## 2023-12-11 DIAGNOSIS — E1159 Type 2 diabetes mellitus with other circulatory complications: Secondary | ICD-10-CM | POA: Diagnosis present

## 2023-12-11 DIAGNOSIS — Z91199 Patient's noncompliance with other medical treatment and regimen due to unspecified reason: Secondary | ICD-10-CM

## 2023-12-11 DIAGNOSIS — G9341 Metabolic encephalopathy: Secondary | ICD-10-CM | POA: Diagnosis present

## 2023-12-11 DIAGNOSIS — T383X6A Underdosing of insulin and oral hypoglycemic [antidiabetic] drugs, initial encounter: Secondary | ICD-10-CM | POA: Diagnosis present

## 2023-12-11 DIAGNOSIS — R739 Hyperglycemia, unspecified: Secondary | ICD-10-CM | POA: Diagnosis not present

## 2023-12-11 DIAGNOSIS — N179 Acute kidney failure, unspecified: Secondary | ICD-10-CM | POA: Diagnosis present

## 2023-12-11 LAB — CBC
HCT: 47.7 % (ref 39.0–52.0)
Hemoglobin: 16.1 g/dL (ref 13.0–17.0)
MCH: 28.4 pg (ref 26.0–34.0)
MCHC: 33.8 g/dL (ref 30.0–36.0)
MCV: 84.3 fL (ref 80.0–100.0)
Platelets: 271 10*3/uL (ref 150–400)
RBC: 5.66 MIL/uL (ref 4.22–5.81)
RDW: 12.3 % (ref 11.5–15.5)
WBC: 4.7 10*3/uL (ref 4.0–10.5)
nRBC: 0 % (ref 0.0–0.2)

## 2023-12-11 LAB — I-STAT CHEM 8, ED
BUN: 27 mg/dL — ABNORMAL HIGH (ref 6–20)
Calcium, Ion: 1.13 mmol/L — ABNORMAL LOW (ref 1.15–1.40)
Chloride: 89 mmol/L — ABNORMAL LOW (ref 98–111)
Creatinine, Ser: 1.7 mg/dL — ABNORMAL HIGH (ref 0.61–1.24)
Glucose, Bld: >700 mg/dL (ref 70–99)
HCT: 51 % (ref 39.0–52.0)
Hemoglobin: 17.3 g/dL — ABNORMAL HIGH (ref 13.0–17.0)
Potassium: 5.9 mmol/L — ABNORMAL HIGH (ref 3.5–5.1)
Sodium: 122 mmol/L — ABNORMAL LOW (ref 135–145)
TCO2: 28 mmol/L (ref 22–32)

## 2023-12-11 LAB — BLOOD GAS, VENOUS
Acid-Base Excess: 2.6 mmol/L — ABNORMAL HIGH (ref 0.0–2.0)
Bicarbonate: 29.8 mmol/L — ABNORMAL HIGH (ref 20.0–28.0)
O2 Saturation: 48.4 %
Patient temperature: 37
pCO2, Ven: 54 mmHg (ref 44–60)
pH, Ven: 7.35 (ref 7.25–7.43)
pO2, Ven: <31 mmHg — CL (ref 32–45)

## 2023-12-11 LAB — BETA-HYDROXYBUTYRIC ACID: Beta-Hydroxybutyric Acid: 0.29 mmol/L — ABNORMAL HIGH (ref 0.05–0.27)

## 2023-12-11 LAB — CBG MONITORING, ED: Glucose-Capillary: >600 mg/dL (ref 70–99)

## 2023-12-11 MED ORDER — SODIUM CHLORIDE 0.9 % IV BOLUS
1000.0000 mL | Freq: Once | INTRAVENOUS | Status: AC
  Administered 2023-12-12: 1000 mL via INTRAVENOUS

## 2023-12-11 MED ORDER — LACTATED RINGERS IV SOLN: INTRAVENOUS | Status: AC

## 2023-12-11 MED ORDER — DEXTROSE IN LACTATED RINGERS 5 % IV SOLN: INTRAVENOUS | Status: AC

## 2023-12-11 MED ORDER — INSULIN REGULAR(HUMAN) IN NACL 100-0.9 UT/100ML-% IV SOLN
INTRAVENOUS | Status: DC
  Administered 2023-12-12: 15 [IU]/h via INTRAVENOUS
  Filled 2023-12-11: qty 100

## 2023-12-11 MED ORDER — DEXTROSE 50 % IV SOLN: 0.0000 mL | INTRAVENOUS | Status: DC | PRN

## 2023-12-11 MED ORDER — LACTATED RINGERS IV BOLUS
20.0000 mL/kg | Freq: Once | INTRAVENOUS | Status: AC
  Administered 2023-12-12: 1760 mL via INTRAVENOUS

## 2023-12-11 NOTE — ED Provider Notes (Signed)
 [facility] EMERGENCY DEPARTMENT AT [facility] Provider Note   CSN: [id number] Arrival date & time: 12/11/23  2223     History {Add pertinent medical, surgical, social history, OB history to HPI:1} Chief Complaint  Patient presents with   Hyperglycemia    [name] [name] is a 38 y.o. male who presents via EMS after syncopal episode at the store.  States he presented to buy a soda.  Patient somnolent, slurring speech, somewhat confused.  CBG greater than 600 in triage.  Level 5 caveat due to acuity of presentation upon arrival.. Patient with history of type 2 diabetes, hypertension, hyperlipidemia who is poorly compliant with his insulin  and was recently admitted to the hospital for HHS.  Recreational drug use with amphetamines.  Tobacco use.  HPI     Home Medications Prior to Admission medications   Medication Sig Start Date End Date Taking? Authorizing Provider  amLODipine  (NORVASC ) 5 MG tablet Take 1 tablet (5 mg total) by mouth daily. 12/01/23   Audria Leather, MD  blood glucose meter kit and supplies KIT Use to check blood sugar three times daily 09/17/23   Ofelia Bent S, PA  Glucose Blood (BLOOD GLUCOSE TEST STRIPS) STRP Use test strip to check blood sugar three times daily 09/17/23   Ofelia Bent S, PA  insulin  glargine (LANTUS ) 100 unit/mL SOPN Inject 25 Units into the skin at bedtime. 11/30/23   Audria Leather, MD  insulin  lispro (HUMALOG ) 100 UNIT/ML KwikPen Inject 8 Units into the skin 3 (three) times daily with meals. Only take if eating a meal AND Blood Glucose (BG) is 80 or higher. 11/30/23   Audria Leather, MD  Insulin  Pen Needle (PEN NEEDLES) 31G X 5 MM MISC Use  three times daily 09/17/23   Coretta Dexter, PA  Lancet Device MISC Use 3 (three) times daily. 09/17/23   Coretta Dexter, PA  Lancets MISC Use to check blood sugar three times daily 08/26/23   Danford, Willis Harter, MD  ondansetron  (ZOFRAN ) 4 MG tablet Take 1 tablet (4 mg total) by mouth every 6 (six)  hours as needed for nausea. 11/30/23   Audria Leather, MD      Allergies    Patient has no known allergies.    Review of Systems   Review of Systems  Unable to perform ROS: Mental status change    Physical Exam Updated Vital Signs BP (!) 141/81 (BP Location: Left Arm)   Pulse 98   Temp 98.1 F (36.7 C) (Oral)   Resp 15   SpO2 97%  Physical Exam Vitals and nursing note reviewed.  Constitutional:      Appearance: He is ill-appearing. He is not toxic-appearing.  HENT:     Head: Normocephalic and atraumatic.     Mouth/Throat:     Mouth: Mucous membranes are dry.     Pharynx: No oropharyngeal exudate or posterior oropharyngeal erythema.  Eyes:     General:        Right eye: No discharge.        Left eye: No discharge.     Conjunctiva/sclera: Conjunctivae normal.  Cardiovascular:     Rate and Rhythm: Normal rate and regular rhythm.     Pulses: Normal pulses.     Heart sounds: Normal heart sounds. No murmur heard. Pulmonary:     Effort: Pulmonary effort is normal. No tachypnea, bradypnea, accessory muscle usage or respiratory distress.     Breath sounds: Normal breath sounds. No wheezing or rales.  Chest:     Chest wall: No mass, lacerations, deformity, swelling, tenderness, crepitus or edema.  Abdominal:     General: Bowel sounds are normal. There is no distension.     Palpations: Abdomen is soft.     Tenderness: There is no abdominal tenderness.  Musculoskeletal:        General: No deformity.     Cervical back: Neck supple.     Right lower leg: No edema.     Left lower leg: No edema.  Skin:    General: Skin is warm and dry.  Neurological:     Mental Status: He is lethargic.     GCS: GCS eye subscore is 2. GCS verbal subscore is 4. GCS motor subscore is 6.  Psychiatric:        Mood and Affect: Mood normal.     ED Results / Procedures / Treatments   Labs (all labs ordered are listed, but only abnormal results are displayed) Labs Reviewed  BLOOD GAS, VENOUS -  Abnormal; Notable for the following components:      Result Value   pO2, Ven <31 (*)    Bicarbonate 29.8 (*)    Acid-Base Excess 2.6 (*)    All other components within normal limits  CBG MONITORING, ED - Abnormal; Notable for the following components:   Glucose-Capillary >600 (*)    All other components within normal limits  I-STAT CHEM 8, ED - Abnormal; Notable for the following components:   Sodium 122 (*)    Potassium 5.9 (*)    Chloride 89 (*)    BUN 27 (*)    Creatinine, Ser 1.70 (*)    Glucose, Bld >700 (*)    Calcium , Ion 1.13 (*)    Hemoglobin 17.3 (*)    All other components within normal limits  CBC  BETA-HYDROXYBUTYRIC ACID  OSMOLALITY  URINALYSIS, ROUTINE W REFLEX MICROSCOPIC  COMPREHENSIVE METABOLIC PANEL WITH GFR  CBG MONITORING, ED    EKG None  Radiology No results found.  Procedures Procedures  {Document cardiac monitor, telemetry assessment procedure when appropriate:1}  Medications Ordered in ED Medications  sodium chloride  0.9 % bolus 1,000 mL (has no administration in time range)  lactated ringers  bolus 1,760 mL (has no administration in time range)  insulin  regular, human (MYXREDLIN ) 100 units/ 100 mL infusion (has no administration in time range)  lactated ringers  infusion (has no administration in time range)  dextrose  5 % in lactated ringers  infusion (has no administration in time range)  dextrose  50 % solution 0-50 mL (has no administration in time range)    ED Course/ Medical Decision Making/ A&P   {   Click here for ABCD2, HEART and other calculatorsREFRESH Note before signing :1}                              Medical Decision Making 38 year old male with syncopal episode, CBG greater than 600 in triage.  Hypertensive on intake, vital signs otherwise normal.  Cardiopulmonary and abdominal exams are benign.  Patient with GCS of 12 at this time, protecting his airway but is confused.  Dry mucous membranes, significant urine output while  he was in the room, using the urinal with the assistance of ED RN.  The differential for syncope is extensive and includes, but is not limited to: arrythmia (Vtach, SVT, SSS, sinus arrest, AV block, bradycardia), aortic stenosis, AMI, hypertrophic obstructive cardiomyopathy (HOCM), PE, atrial myxoma, pulmonary hypertension, orthostatic  hypotension, hypovolemia, drug effect, GB syndrome, micturition, cough, carotid sinus sensitivity, seizure, TIA/CVA, hypoglycemia, vertigo.   Amount and/or Complexity of Data Reviewed Labs: ordered.    Details: CBC unremarkable, i-STAT Chem-8 with pseudohyponatremia of 122, glucose greater than 700.  Creatinine 1.7, potassium 5.9.  CBG with pH of 7.35.  Risk Prescription drug management.   ***  {Document critical care time when appropriate:1} {Document review of labs and clinical decision tools ie heart score, Chads2Vasc2 etc:1}  {Document your independent review of radiology images, and any outside records:1} {Document your discussion with family members, caretakers, and with consultants:1} {Document social determinants of health affecting pt's care:1} {Document your decision making why or why not admission, treatments were needed:1} Final Clinical Impression(s) / ED Diagnoses Final diagnoses:  None    Rx / DC Orders ED Discharge Orders     None

## 2023-12-11 NOTE — ED Triage Notes (Signed)
 BIBA Per EMS: Pt coming from home w/ hyperglycemia & nausea. A&Ox4 however pt lethargic. VSS

## 2023-12-12 ENCOUNTER — Encounter (HOSPITAL_COMMUNITY): Payer: Self-pay | Admitting: Family Medicine

## 2023-12-12 DIAGNOSIS — N179 Acute kidney failure, unspecified: Secondary | ICD-10-CM | POA: Diagnosis not present

## 2023-12-12 DIAGNOSIS — G9341 Metabolic encephalopathy: Secondary | ICD-10-CM

## 2023-12-12 DIAGNOSIS — Z5329 Procedure and treatment not carried out because of patient's decision for other reasons: Secondary | ICD-10-CM | POA: Diagnosis not present

## 2023-12-12 DIAGNOSIS — E1165 Type 2 diabetes mellitus with hyperglycemia: Secondary | ICD-10-CM | POA: Diagnosis not present

## 2023-12-12 DIAGNOSIS — I152 Hypertension secondary to endocrine disorders: Secondary | ICD-10-CM | POA: Diagnosis not present

## 2023-12-12 DIAGNOSIS — E1159 Type 2 diabetes mellitus with other circulatory complications: Secondary | ICD-10-CM

## 2023-12-12 DIAGNOSIS — E1100 Type 2 diabetes mellitus with hyperosmolarity without nonketotic hyperglycemic-hyperosmolar coma (NKHHC): Principal | ICD-10-CM

## 2023-12-12 DIAGNOSIS — R000 Tachycardia, unspecified: Secondary | ICD-10-CM | POA: Diagnosis not present

## 2023-12-12 DIAGNOSIS — E785 Hyperlipidemia, unspecified: Secondary | ICD-10-CM | POA: Diagnosis not present

## 2023-12-12 DIAGNOSIS — F17210 Nicotine dependence, cigarettes, uncomplicated: Secondary | ICD-10-CM | POA: Diagnosis not present

## 2023-12-12 DIAGNOSIS — R55 Syncope and collapse: Secondary | ICD-10-CM | POA: Diagnosis not present

## 2023-12-12 DIAGNOSIS — E669 Obesity, unspecified: Secondary | ICD-10-CM | POA: Diagnosis not present

## 2023-12-12 DIAGNOSIS — Z79899 Other long term (current) drug therapy: Secondary | ICD-10-CM | POA: Diagnosis not present

## 2023-12-12 DIAGNOSIS — Z91199 Patient's noncompliance with other medical treatment and regimen due to unspecified reason: Secondary | ICD-10-CM | POA: Diagnosis not present

## 2023-12-12 DIAGNOSIS — T383X6A Underdosing of insulin and oral hypoglycemic [antidiabetic] drugs, initial encounter: Secondary | ICD-10-CM | POA: Diagnosis not present

## 2023-12-12 DIAGNOSIS — Z794 Long term (current) use of insulin: Secondary | ICD-10-CM | POA: Diagnosis not present

## 2023-12-12 LAB — BASIC METABOLIC PANEL WITH GFR
Anion gap: 8 (ref 5–15)
Anion gap: 10 (ref 5–15)
BUN: 20 mg/dL (ref 6–20)
BUN: 21 mg/dL — ABNORMAL HIGH (ref 6–20)
CO2: 23 mmol/L (ref 22–32)
CO2: 23 mmol/L (ref 22–32)
Calcium: 9.3 mg/dL (ref 8.9–10.3)
Calcium: 9 mg/dL (ref 8.9–10.3)
Chloride: 100 mmol/L (ref 98–111)
Chloride: 99 mmol/L (ref 98–111)
Creatinine, Ser: 1.14 mg/dL (ref 0.61–1.24)
Creatinine, Ser: 1.08 mg/dL (ref 0.61–1.24)
GFR, Estimated: >60 mL/min (ref >60)
GFR, Estimated: >60 mL/min (ref >60)
Glucose, Bld: 241 mg/dL — ABNORMAL HIGH (ref 70–99)
Glucose, Bld: 208 mg/dL — ABNORMAL HIGH (ref 70–99)
Potassium: 3.6 mmol/L (ref 3.5–5.1)
Potassium: 3.6 mmol/L (ref 3.5–5.1)
Sodium: 131 mmol/L — ABNORMAL LOW (ref 135–145)
Sodium: 132 mmol/L — ABNORMAL LOW (ref 135–145)

## 2023-12-12 LAB — GLUCOSE, CAPILLARY
Glucose-Capillary: 91 mg/dL (ref 70–99)
Glucose-Capillary: 99 mg/dL (ref 70–99)
Glucose-Capillary: 195 mg/dL — ABNORMAL HIGH (ref 70–99)
Glucose-Capillary: 221 mg/dL — ABNORMAL HIGH (ref 70–99)
Glucose-Capillary: 132 mg/dL — ABNORMAL HIGH (ref 70–99)
Glucose-Capillary: 92 mg/dL (ref 70–99)
Glucose-Capillary: 176 mg/dL — ABNORMAL HIGH (ref 70–99)
Glucose-Capillary: 107 mg/dL — ABNORMAL HIGH (ref 70–99)
Glucose-Capillary: 354 mg/dL — ABNORMAL HIGH (ref 70–99)

## 2023-12-12 LAB — COMPREHENSIVE METABOLIC PANEL WITH GFR
ALT: 22 U/L (ref 0–44)
AST: 24 U/L (ref 15–41)
Albumin: 4.1 g/dL (ref 3.5–5.0)
Alkaline Phosphatase: 107 U/L (ref 38–126)
Anion gap: 10 (ref 5–15)
BUN: 23 mg/dL — ABNORMAL HIGH (ref 6–20)
CO2: 26 mmol/L (ref 22–32)
Calcium: 9.8 mg/dL (ref 8.9–10.3)
Chloride: 92 mmol/L — ABNORMAL LOW (ref 98–111)
Creatinine, Ser: 1.54 mg/dL — ABNORMAL HIGH (ref 0.61–1.24)
GFR, Estimated: 59 mL/min — ABNORMAL LOW (ref >60)
Glucose, Bld: 614 mg/dL (ref 70–99)
Potassium: 4.3 mmol/L (ref 3.5–5.1)
Sodium: 128 mmol/L — ABNORMAL LOW (ref 135–145)
Total Bilirubin: 0.7 mg/dL (ref 0.0–1.2)
Total Protein: 7.6 g/dL (ref 6.5–8.1)

## 2023-12-12 LAB — URINALYSIS, ROUTINE W REFLEX MICROSCOPIC
Bacteria, UA: NONE SEEN
Bilirubin Urine: NEGATIVE
Glucose, UA: >=500 mg/dL — AB
Hgb urine dipstick: NEGATIVE
Ketones, ur: NEGATIVE mg/dL
Leukocytes,Ua: NEGATIVE
Nitrite: NEGATIVE
Protein, ur: NEGATIVE mg/dL
Specific Gravity, Urine: 1.026 (ref 1.005–1.030)
pH: 6 (ref 5.0–8.0)

## 2023-12-12 LAB — CBG MONITORING, ED
Glucose-Capillary: >600 mg/dL (ref 70–99)
Glucose-Capillary: 263 mg/dL — ABNORMAL HIGH (ref 70–99)
Glucose-Capillary: 200 mg/dL — ABNORMAL HIGH (ref 70–99)

## 2023-12-12 LAB — MRSA NEXT GEN BY PCR, NASAL: MRSA by PCR Next Gen: DETECTED — AB

## 2023-12-12 LAB — OSMOLALITY
Osmolality: 323 mosm/kg (ref 275–295)
Osmolality: 292 mosm/kg (ref 275–295)

## 2023-12-12 MED ORDER — AMLODIPINE BESYLATE 5 MG PO TABS
5.0000 mg | ORAL_TABLET | Freq: Every day | ORAL | Status: DC
  Administered 2023-12-12 – 2023-12-14 (×3): 5 mg via ORAL
  Filled 2023-12-12 (×3): qty 1

## 2023-12-12 MED ORDER — ENOXAPARIN SODIUM 40 MG/0.4ML IJ SOSY
40.0000 mg | PREFILLED_SYRINGE | INTRAMUSCULAR | Status: DC
  Administered 2023-12-12 – 2023-12-13 (×2): 40 mg via SUBCUTANEOUS
  Filled 2023-12-12 (×3): qty 0.4

## 2023-12-12 MED ORDER — INSULIN ASPART 100 UNIT/ML IJ SOLN
10.0000 [IU] | Freq: Three times a day (TID) | INTRAMUSCULAR | Status: DC
  Administered 2023-12-12 (×2): 10 [IU] via SUBCUTANEOUS

## 2023-12-12 MED ORDER — CHLORHEXIDINE GLUCONATE CLOTH 2 % EX PADS
6.0000 | MEDICATED_PAD | Freq: Every day | CUTANEOUS | Status: DC
  Administered 2023-12-12: 6 via TOPICAL

## 2023-12-12 MED ORDER — INSULIN GLARGINE-YFGN 100 UNIT/ML ~~LOC~~ SOLN
30.0000 [IU] | SUBCUTANEOUS | Status: DC
  Administered 2023-12-12: 30 [IU] via SUBCUTANEOUS
  Filled 2023-12-12: qty 0.3

## 2023-12-12 MED ORDER — INSULIN ASPART 100 UNIT/ML IJ SOLN
0.0000 [IU] | Freq: Three times a day (TID) | INTRAMUSCULAR | Status: DC
  Administered 2023-12-12: 2 [IU] via SUBCUTANEOUS
  Administered 2023-12-12: 1 [IU] via SUBCUTANEOUS
  Administered 2023-12-13: 3 [IU] via SUBCUTANEOUS
  Administered 2023-12-13: 7 [IU] via SUBCUTANEOUS
  Administered 2023-12-14: 5 [IU] via SUBCUTANEOUS

## 2023-12-12 MED ORDER — INSULIN GLARGINE-YFGN 100 UNIT/ML ~~LOC~~ SOLN
20.0000 [IU] | SUBCUTANEOUS | Status: DC
  Administered 2023-12-13: 20 [IU] via SUBCUTANEOUS
  Filled 2023-12-12 (×2): qty 0.2

## 2023-12-12 MED ORDER — INSULIN ASPART 100 UNIT/ML IJ SOLN
0.0000 [IU] | Freq: Every day | INTRAMUSCULAR | Status: DC
  Administered 2023-12-12: 5 [IU] via SUBCUTANEOUS

## 2023-12-12 MED ORDER — MUPIROCIN 2 % EX OINT
TOPICAL_OINTMENT | Freq: Two times a day (BID) | CUTANEOUS | Status: DC
  Filled 2023-12-12 (×3): qty 22

## 2023-12-12 NOTE — H&P (Signed)
 History and Physical    SEIBERT BJELLAND QMV:[id number] DOB: 04-Feb-1986 DOA: 12/11/2023  PCP: Darol Elizabeth, NP   Patient coming from: Home   Chief Complaint: Nausea, hyperglycemia, syncope  HPI: JAAMAL SANDRA is a 38 y.o. male with medical history significant for hypertension, insulin -dependent diabetes mellitus, and substance abuse who presents with nausea, hyperglycemia, and syncope.  Patient went to the store to buy soda last night, had a syncopal event while he was there, and was found by EMS to be somnolent and confused.  His CBG was >600.   He denies abdominal pain, vomiting, chest pain, shortness of breath, fever, or chills.  ED Course: Upon arrival to the ED, patient is found to be afebrile and saturating well on room air with mild tachycardia and elevated blood pressure.  Labs are most notable for glucose >700, creatinine 1.70, normal serum bicarbonate, normal anion gap, and normal WBC.   Patient was given 2.8 L of IVF and was started on insulin  infusion in the ED.  Review of Systems:  All other systems reviewed and apart from HPI, are negative.  Past Medical History:  Diagnosis Date   Diabetes mellitus without complication (HCC)    DKA (diabetic ketoacidosis) (HCC) 05/08/2022   HTN (hypertension) 05/08/2022   Hyperlipidemia    Hyperosmolar hyperglycemic state (HHS) (HCC) 05/08/2022   Hypertension    Hypoglycemia 07/02/2019   Pseudohyponatremia 05/08/2022    Past Surgical History:  Procedure Laterality Date   HAND SURGERY      Social History:   reports that he has been smoking cigarettes. He has never used smokeless tobacco. He reports current drug use. Drugs: Amphetamines and Marijuana. He reports that he does not drink alcohol.  No Known Allergies  Family History  Family history unknown: Yes     Prior to Admission medications   Medication Sig Start Date End Date Taking? Authorizing Provider  amLODipine  (NORVASC ) 5 MG tablet Take 1 tablet (5 mg total)  by mouth daily. 12/01/23   Audria Leather, MD  blood glucose meter kit and supplies KIT Use to check blood sugar three times daily 09/17/23   Ofelia Bent S, PA  Glucose Blood (BLOOD GLUCOSE TEST STRIPS) STRP Use test strip to check blood sugar three times daily 09/17/23   Ofelia Bent S, PA  insulin  glargine (LANTUS ) 100 unit/mL SOPN Inject 25 Units into the skin at bedtime. 11/30/23   Audria Leather, MD  insulin  lispro (HUMALOG ) 100 UNIT/ML KwikPen Inject 8 Units into the skin 3 (three) times daily with meals. Only take if eating a meal AND Blood Glucose (BG) is 80 or higher. 11/30/23   Audria Leather, MD  Insulin  Pen Needle (PEN NEEDLES) 31G X 5 MM MISC Use  three times daily 09/17/23   Coretta Dexter, PA  Lancet Device MISC Use 3 (three) times daily. 09/17/23   Coretta Dexter, PA  Lancets MISC Use to check blood sugar three times daily 08/26/23   Danford, Willis Harter, MD  ondansetron  (ZOFRAN ) 4 MG tablet Take 1 tablet (4 mg total) by mouth every 6 (six) hours as needed for nausea. 11/30/23   Audria Leather, MD    Physical Exam: Vitals:   12/12/23 0130 12/12/23 0239 12/12/23 0300 12/12/23 0330  BP: 130/79  (!) 162/92 (!) 151/101  Pulse: (!) 103   82  Resp: 12  13 17   Temp:  98.1 F (36.7 C)  97.6 F (36.4 C)  TempSrc:  Oral  Oral  SpO2: 97%  99%  Weight:    88.6 kg  Height:    5\' 11"  (1.803 m)     Constitutional: NAD, calm  Eyes: PERTLA, lids and conjunctivae normal ENMT: Mucous membranes are dry. Posterior pharynx clear of any exudate or lesions.   Neck: supple, no masses  Respiratory: no wheezing, no crackles. No accessory muscle use.  Cardiovascular: S1 & S2 heard, regular rate and rhythm. No extremity edema.   Abdomen: No distension, no tenderness, soft. Bowel sounds active.  Musculoskeletal: no clubbing / cyanosis. No joint deformity upper and lower extremities.   Skin: no significant rashes, lesions, ulcers. Warm, dry, well-perfused. Neurologic: CN 2-12 grossly intact.  Moving all extremities. Alert and oriented.  Psychiatric: Pleasant. Cooperative.    Labs and Imaging on Admission: I have personally reviewed following labs and imaging studies  CBC: Recent Labs  Lab 12/11/23 2249 12/11/23 2256  WBC 4.7  --   HGB 16.1 17.3*  HCT 47.7 51.0  MCV 84.3  --   PLT 271  --    Basic Metabolic Panel: Recent Labs  Lab 12/11/23 2256 12/12/23 0103  NA 122* 128*  K 5.9* 4.3  CL 89* 92*  CO2  --  26  GLUCOSE >700* 614*  BUN 27* 23*  CREATININE 1.70* 1.54*  CALCIUM   --  9.8   GFR: Estimated Creatinine Clearance: 69.3 mL/min (A) (by C-G formula based on SCr of 1.54 mg/dL (H)). Liver Function Tests: Recent Labs  Lab 12/12/23 0103  AST 24  ALT 22  ALKPHOS 107  BILITOT 0.7  PROT 7.6  ALBUMIN 4.1   No results for input(s): "LIPASE", "AMYLASE" in the last 168 hours. No results for input(s): "AMMONIA" in the last 168 hours. Coagulation Profile: No results for input(s): "INR", "PROTIME" in the last 168 hours. Cardiac Enzymes: No results for input(s): "CKTOTAL", "CKMB", "CKMBINDEX", "TROPONINI" in the last 168 hours. BNP (last 3 results) No results for input(s): "PROBNP" in the last 8760 hours. HbA1C: No results for input(s): "HGBA1C" in the last 72 hours. CBG: Recent Labs  Lab 12/11/23 2229 12/12/23 0109 12/12/23 0147 12/12/23 0237  GLUCAP >600* >600* 263* 200*   Lipid Profile: No results for input(s): "CHOL", "HDL", "LDLCALC", "TRIG", "CHOLHDL", "LDLDIRECT" in the last 72 hours. Thyroid  Function Tests: No results for input(s): "TSH", "T4TOTAL", "FREET4", "T3FREE", "THYROIDAB" in the last 72 hours. Anemia Panel: No results for input(s): "VITAMINB12", "FOLATE", "FERRITIN", "TIBC", "IRON", "RETICCTPCT" in the last 72 hours. Urine analysis:    Component Value Date/Time   COLORURINE COLORLESS (A) 12/12/2023 0104   APPEARANCEUR CLEAR 12/12/2023 0104   LABSPEC 1.026 12/12/2023 0104   PHURINE 6.0 12/12/2023 0104   GLUCOSEU >=500 (A)  12/12/2023 0104   HGBUR NEGATIVE 12/12/2023 0104   BILIRUBINUR NEGATIVE 12/12/2023 0104   KETONESUR NEGATIVE 12/12/2023 0104   PROTEINUR NEGATIVE 12/12/2023 0104   NITRITE NEGATIVE 12/12/2023 0104   LEUKOCYTESUR NEGATIVE 12/12/2023 0104   Sepsis Labs: @LABRCNTIP (procalcitonin:4,lacticidven:4) )No results found for this or any previous visit (from the past 240 hours).   Radiological Exams on Admission: No results found.  EKG: Independently reviewed. Sinus tachycardia, incomplete RBBB, LAFB.   Assessment/Plan   1. HHS; type II DM  - A1c was 12.9% in May 2025  - Serum glucose >700 on admission with normal AG and normal serum bicarbonate  - Continue IVF hydration, continue insulin  infusion with frequent CBGs and serial chem panels    2. Acute encephalopathy  - Improving  - Anticipate continued improvement with treatment of HHS  3. AKI  - Continue IVF hydration, renally-dose medications, repeat chem panel    4. Syncope  - No significant structural cardiac abnormalities on recent echo  - Continue cardiac monitoring, continue IVF hydration and glycemic-control   5. Hypertension  - Norvasc      DVT prophylaxis: Lovenox   Code Status: Full  Level of Care: Level of care: Stepdown Family Communication: None present   Disposition Plan:  Patient is from: home  Anticipated d/c is to: Home  Anticipated d/c date is: 12/13/23  Patient currently: Pending glycemic-control, cardiac monitoring, improved renal function, and improved mental status  Consults called: None  Admission status: Observation     Walton Guppy, MD [facility]  12/12/2023, 3:44 AM

## 2023-12-12 NOTE — Plan of Care (Signed)

## 2023-12-12 NOTE — ED Notes (Signed)
 CBG remains HIGH,>600. EndoTool recommends to continue Insulin  at current rate 15units/hr. Recheck @0146 .

## 2023-12-12 NOTE — Plan of Care (Signed)
  Problem: Metabolic: Goal: Ability to maintain appropriate glucose levels will improve Outcome: Progressing   Problem: Nutritional: Goal: Maintenance of adequate nutrition will improve Outcome: Progressing

## 2023-12-12 NOTE — Progress Notes (Signed)
 PROGRESS NOTE  [name] [name]  ION:[id number] DOB: 1985-12-23 DOA: 12/11/2023 PCP: Darol Elizabeth, NP  Consultants  Brief Narrative: 38 y.o. male with medical history significant for hypertension, insulin -dependent diabetes mellitus, and substance abuse who presents with nausea, hyperglycemia, and syncope.  Patient went to the store to buy soda last night, had a syncopal event while he was there, and was found by EMS to be somnolent and confused.  His CBG was >600.  Brought to ED for same and admitted to stepdown unit for HHS.   Assessment & Plan: HHS; type II DM  - A1c was 12.9% in May 2025  - Serum glucose >700 on admission with normal AG and normal serum bicarbonate  - out of his insulin  for past 1-2 weeks.  Reason for HHS - Continue IVF hydration, continue insulin  infusion with frequent CBGs and serial chem panels   - CBGs dropped o/n.  Patient now on D5LR.  Do not want to drop too fast - s/p subQ insulin  long-acting.  Still on glucomander.  He is essentially almost out of HHS at this point -- CBGs <250, mental status clearing.  Awaiting serum osm repeat.   - plan will be to stop insulin  drip when osm <300 and leave on subQ insulin .  I did decrease his long-acting and removed the scheduled TID short-acting b/c of how much his insulin  dropped o/n - will need meds prior to DC   2. Acute encephalopathy  - Improved   3. AKI  - Continue IVF hydration, renally-dose medications, repeat chem panel   - resolving.  Likely still fluid down, continue IVF even though he is taking PO.     4. Syncope  - No significant structural cardiac abnormalities on recent echo  - Continue cardiac monitoring, continue IVF hydration and glycemic-control  - secondary to HHS   5. Hypertension  - Norvasc    - BPs acceptable here thus far.          DVT prophylaxis:  enoxaparin  (LOVENOX ) injection 40 mg Start: 12/12/23 1000  Code Status:   Code Status: Full Code Level of care: Stepdown Status is:  Inpatient   Consults called: none   Subjective: Patient sleeping but easily arousable.  Complains of mild headache.  No arm/leg weakness.  Reports feeling much better than when he arrived.    Objective: Vitals:   12/12/23 1100 12/12/23 1200 12/12/23 1229 12/12/23 1300  BP: (!) 178/110 (!) 167/101  (!) 160/105  Pulse: 76 92  84  Resp: 17 16  15   Temp:   98.3 F (36.8 C)   TempSrc:   Oral   SpO2: 98% 96%  98%  Weight:      Height:        Intake/Output Summary (Last 24 hours) at 12/12/2023 1531 Last data filed at 12/12/2023 1336 Gross per 24 hour  Intake 1313.9 ml  Output 1400 ml  Net -86.1 ml   Filed Weights   12/12/23 0330  Weight: 88.6 kg   Body mass index is 27.24 kg/m.  Gen: 38 y.o. male in no apparent distress.  Nontoxic.  Obese Pulm: Non-labored breathing.  Clear to auscultation bilaterally.  CV: Regular rate and rhythm. No murmur, rub, or gallop. No JVD GI: Abdomen soft, non-tender, non-distended Ext: Warm, no deformities, no pedal edema Skin: No rashes, lesions noulcers Neuro: Alert and oriented. No focal neurological deficits, moving all limbs symmetrically, no facial droop/asymmetry Psych: Calm  Judgement and insight appear normal. Mood & affect appropriate.  I have personally reviewed the following labs and images: CBC: Recent Labs  Lab 12/11/23 2249 12/11/23 2256  WBC 4.7  --   HGB 16.1 17.3*  HCT 47.7 51.0  MCV 84.3  --   PLT 271  --    BMP &GFR Recent Labs  Lab 12/11/23 2256 12/12/23 0103 12/12/23 0549 12/12/23 1329  NA 122* 128* 131* 132*  K 5.9* 4.3 3.6 3.6  CL 89* 92* 100 99  CO2  --  26 23 23   GLUCOSE >700* 614* 241* 208*  BUN 27* 23* 20 21*  CREATININE 1.70* 1.54* 1.14 1.08  CALCIUM   --  9.8 9.3 9.0   Estimated Creatinine Clearance: 98.8 mL/min (by C-G formula based on SCr of 1.08 mg/dL). Liver & Pancreas: Recent Labs  Lab 12/12/23 0103  AST 24  ALT 22  ALKPHOS 107  BILITOT 0.7  PROT 7.6  ALBUMIN 4.1   No results  for input(s): "LIPASE", "AMYLASE" in the last 168 hours. No results for input(s): "AMMONIA" in the last 168 hours. Diabetic: No results for input(s): "HGBA1C" in the last 72 hours. Recent Labs  Lab 12/12/23 0518 12/12/23 0632 12/12/23 0801 12/12/23 1038 12/12/23 1201  GLUCAP 195* 221* 132* 92 176*   Cardiac Enzymes: No results for input(s): "CKTOTAL", "CKMB", "CKMBINDEX", "TROPONINI" in the last 168 hours. No results for input(s): "PROBNP" in the last 8760 hours. Coagulation Profile: No results for input(s): "INR", "PROTIME" in the last 168 hours. Thyroid  Function Tests: No results for input(s): "TSH", "T4TOTAL", "FREET4", "T3FREE", "THYROIDAB" in the last 72 hours. Lipid Profile: No results for input(s): "CHOL", "HDL", "LDLCALC", "TRIG", "CHOLHDL", "LDLDIRECT" in the last 72 hours. Anemia Panel: No results for input(s): "VITAMINB12", "FOLATE", "FERRITIN", "TIBC", "IRON", "RETICCTPCT" in the last 72 hours. Urine analysis:    Component Value Date/Time   COLORURINE COLORLESS (A) 12/12/2023 0104   APPEARANCEUR CLEAR 12/12/2023 0104   LABSPEC 1.026 12/12/2023 0104   PHURINE 6.0 12/12/2023 0104   GLUCOSEU >=500 (A) 12/12/2023 0104   HGBUR NEGATIVE 12/12/2023 0104   BILIRUBINUR NEGATIVE 12/12/2023 0104   KETONESUR NEGATIVE 12/12/2023 0104   PROTEINUR NEGATIVE 12/12/2023 0104   NITRITE NEGATIVE 12/12/2023 0104   LEUKOCYTESUR NEGATIVE 12/12/2023 0104   Sepsis Labs: Invalid input(s): "PROCALCITONIN", "LACTICIDVEN"  Microbiology: Recent Results (from the past 240 hours)  MRSA Next Gen by PCR, Nasal     Status: Abnormal   Collection Time: 12/12/23  3:28 AM   Specimen: Nasal Mucosa; Nasal Swab  Result Value Ref Range Status   MRSA by PCR Next Gen DETECTED (A) NOT DETECTED Final    Comment: (NOTE) The GeneXpert MRSA Assay (FDA approved for NASAL specimens only), is one component of a comprehensive MRSA colonization surveillance program. It is not intended to diagnose MRSA  infection nor to guide or monitor treatment for MRSA infections. Test performance is not FDA approved in patients less than 22 years old. Performed at [facility], [address], [place], [place] [address]     Radiology Studies: No results found.  Scheduled Meds:  amLODipine   5 mg Oral Daily   Chlorhexidine  Gluconate Cloth  6 each Topical Daily   enoxaparin  (LOVENOX ) injection  40 mg Subcutaneous Q24H   insulin  aspart  0-5 Units Subcutaneous QHS   insulin  aspart  0-9 Units Subcutaneous TID WC   [START ON 12/13/2023] insulin  glargine-yfgn  20 Units Subcutaneous Q24H   mupirocin  ointment   Nasal BID   Continuous Infusions:  dextrose  5% lactated ringers  Stopped (12/12/23 0913)  insulin  Stopped (12/12/23 0800)   lactated ringers        LOS: 0 days   55 minutes with more than 50% spent in reviewing records, counseling patient/family and coordinating care.  Trenton Frock, MD [facility] [email or web address] 12/12/2023, 3:31 PM

## 2023-12-12 NOTE — Inpatient Diabetes Management (Signed)
 Inpatient Diabetes Program Recommendations  AACE/ADA: New Consensus Statement on Inpatient Glycemic Control (2015)  Target Ranges:  Prepandial:   less than 140 mg/dL      Peak postprandial:   less than 180 mg/dL (1-2 hours)      Critically ill patients:  140 - 180 mg/dL   Lab Results  Component Value Date   GLUCAP 132 (H) 12/12/2023   HGBA1C 12.9 (H) 11/29/2023    Diabetes history: DM2 Outpatient Diabetes medications:  Lantus  25 units every day Humalog  8 units TID with meals Current orders for Inpatient glycemic control:  Transitioned off of IV insulin  to Semglee  30 units every day, Novolog  0-9 units TID and 0-5 units at bedtime, 10 units TID with meals  Please have insulins and glucometer delivered Meds to Bed at discharge.    Met with patient at bedside.  He states he never got his insulin  after he was discharged on 11/30/23.  He states he obtains insulin  for the [employer].  When asked why he did not get his insulin  he states he did not have insurance coverage; Medicaid is listed on the chart for insurance.  Asked about living situation-he is currently homeless; he stays with friends.    Discussed importance of picking up insulins and administering as prescribed.  He needs to be sure his unopened pens are refrigerated.  He does not have a glucometer to check his BG; will request for one at DC.     Discharge Recommendations: Long acting recommendations: Insulin  Glargine (LANTUS ) Solostar Pen 25 units QD  Short acting recommendations:  Meal coverage ONLY Insulin  lispro (HUMALOG ) KwikPen  8 units TID with meals   Supply/Referral recommendations: Pen needles - standard   Use Adult Diabetes Insulin  Treatment Post Discharge order set.

## 2023-12-12 NOTE — ED Notes (Signed)
CBG 263 

## 2023-12-13 LAB — CBC
HCT: 46.5 % (ref 39.0–52.0)
Hemoglobin: 15.6 g/dL (ref 13.0–17.0)
MCH: 28.4 pg (ref 26.0–34.0)
MCHC: 33.5 g/dL (ref 30.0–36.0)
MCV: 84.7 fL (ref 80.0–100.0)
Platelets: 231 10*3/uL (ref 150–400)
RBC: 5.49 MIL/uL (ref 4.22–5.81)
RDW: 12.6 % (ref 11.5–15.5)
WBC: 4.5 10*3/uL (ref 4.0–10.5)
nRBC: 0 % (ref 0.0–0.2)

## 2023-12-13 LAB — GLUCOSE, CAPILLARY
Glucose-Capillary: 212 mg/dL — ABNORMAL HIGH (ref 70–99)
Glucose-Capillary: 349 mg/dL — ABNORMAL HIGH (ref 70–99)
Glucose-Capillary: 237 mg/dL — ABNORMAL HIGH (ref 70–99)
Glucose-Capillary: 504 mg/dL (ref 70–99)
Glucose-Capillary: 453 mg/dL — ABNORMAL HIGH (ref 70–99)
Glucose-Capillary: 200 mg/dL — ABNORMAL HIGH (ref 70–99)
Glucose-Capillary: 88 mg/dL (ref 70–99)
Glucose-Capillary: 175 mg/dL — ABNORMAL HIGH (ref 70–99)

## 2023-12-13 LAB — BASIC METABOLIC PANEL WITH GFR
Anion gap: 6 (ref 5–15)
BUN: 24 mg/dL — ABNORMAL HIGH (ref 6–20)
CO2: 24 mmol/L (ref 22–32)
Calcium: 8.4 mg/dL — ABNORMAL LOW (ref 8.9–10.3)
Chloride: 100 mmol/L (ref 98–111)
Creatinine, Ser: 0.96 mg/dL (ref 0.61–1.24)
GFR, Estimated: >60 mL/min (ref >60)
Glucose, Bld: 263 mg/dL — ABNORMAL HIGH (ref 70–99)
Potassium: 3.8 mmol/L (ref 3.5–5.1)
Sodium: 130 mmol/L — ABNORMAL LOW (ref 135–145)

## 2023-12-13 LAB — GLUCOSE, RANDOM: Glucose, Bld: 512 mg/dL (ref 70–99)

## 2023-12-13 MED ORDER — INSULIN ASPART 100 UNIT/ML IJ SOLN
10.0000 [IU] | Freq: Once | INTRAMUSCULAR | Status: AC
  Administered 2023-12-13: 10 [IU] via SUBCUTANEOUS

## 2023-12-13 MED ORDER — INSULIN ASPART 100 UNIT/ML IJ SOLN
5.0000 [IU] | Freq: Three times a day (TID) | INTRAMUSCULAR | Status: DC
  Administered 2023-12-13 – 2023-12-14 (×2): 5 [IU] via SUBCUTANEOUS

## 2023-12-13 MED ORDER — INSULIN GLARGINE-YFGN 100 UNIT/ML ~~LOC~~ SOLN
30.0000 [IU] | SUBCUTANEOUS | Status: DC
  Administered 2023-12-14: 30 [IU] via SUBCUTANEOUS
  Filled 2023-12-13: qty 0.3

## 2023-12-13 MED ORDER — SODIUM CHLORIDE 0.9 % IV SOLN: INTRAVENOUS | Status: DC

## 2023-12-13 MED ORDER — INSULIN ASPART 100 UNIT/ML IJ SOLN
15.0000 [IU] | Freq: Once | INTRAMUSCULAR | Status: AC
  Administered 2023-12-13: 15 [IU] via SUBCUTANEOUS

## 2023-12-13 MED ORDER — INSULIN GLARGINE-YFGN 100 UNIT/ML ~~LOC~~ SOLN
10.0000 [IU] | Freq: Once | SUBCUTANEOUS | Status: AC
  Administered 2023-12-13: 10 [IU] via SUBCUTANEOUS
  Filled 2023-12-13: qty 0.1

## 2023-12-13 MED ORDER — INSULIN GLARGINE-YFGN 100 UNIT/ML ~~LOC~~ SOLN: 25.0000 [IU] | SUBCUTANEOUS | Status: DC

## 2023-12-13 NOTE — Plan of Care (Signed)
  Problem: Health Behavior/Discharge Planning: Goal: Ability to manage health-related needs will improve Outcome: Progressing   Problem: Metabolic: Goal: Ability to maintain appropriate glucose levels will improve Outcome: Progressing   Problem: Skin Integrity: Goal: Risk for impaired skin integrity will decrease Outcome: Progressing   Problem: Tissue Perfusion: Goal: Adequacy of tissue perfusion will improve Outcome: Progressing

## 2023-12-14 ENCOUNTER — Other Ambulatory Visit: Payer: Self-pay | Admitting: Family Medicine

## 2023-12-14 LAB — BASIC METABOLIC PANEL WITH GFR
Anion gap: 4 — ABNORMAL LOW (ref 5–15)
BUN: 21 mg/dL — ABNORMAL HIGH (ref 6–20)
CO2: 22 mmol/L (ref 22–32)
Calcium: 8.1 mg/dL — ABNORMAL LOW (ref 8.9–10.3)
Chloride: 105 mmol/L (ref 98–111)
Creatinine, Ser: 0.81 mg/dL (ref 0.61–1.24)
GFR, Estimated: >60 mL/min (ref >60)
Glucose, Bld: 222 mg/dL — ABNORMAL HIGH (ref 70–99)
Potassium: 3.8 mmol/L (ref 3.5–5.1)
Sodium: 131 mmol/L — ABNORMAL LOW (ref 135–145)

## 2023-12-14 LAB — GLUCOSE, CAPILLARY
Glucose-Capillary: 234 mg/dL — ABNORMAL HIGH (ref 70–99)
Glucose-Capillary: 290 mg/dL — ABNORMAL HIGH (ref 70–99)

## 2023-12-14 MED ORDER — INSULIN GLARGINE-YFGN 100 UNIT/ML ~~LOC~~ SOLN: 40.0000 [IU] | SUBCUTANEOUS | Status: DC

## 2023-12-14 MED ORDER — INSULIN LISPRO (1 UNIT DIAL) 100 UNIT/ML (KWIKPEN): 8.0000 [IU] | PEN_INJECTOR | Freq: Three times a day (TID) | SUBCUTANEOUS | 2 refills | Status: DC

## 2023-12-14 MED ORDER — INSULIN GLARGINE 100 UNITS/ML SOLOSTAR PEN: 40.0000 [IU] | PEN_INJECTOR | Freq: Every day | SUBCUTANEOUS | 0 refills | Status: DC

## 2023-12-14 NOTE — Progress Notes (Signed)
 Pt states he wants to leave to go to church with a friend. Requested a bus pass. This RN informed pt that we are unable to give bus pass. As he is leaving against medical advise. MD notified via Secure chat

## 2023-12-14 NOTE — Discharge Summary (Signed)
 Physician Discharge Summary   Patient: [name] [name] MRN: [id number] DOB: Aug 25, 1985  Admit date:     12/11/2023  Discharge date: 12/14/23  Discharge Physician: Trenton Frock   PCP: Darol Elizabeth, NP   Recommendations at discharge:    Patient left hospital AMA.  I refilled his insulins to be picked up after he left the hospital.    Discharge Diagnoses: Principal Problem:   Hyperosmolar hyperglycemic state (HHS) (HCC) Active Problems:   Acute metabolic encephalopathy   AKI (acute kidney injury) (HCC)   Hypertension associated with diabetes (HCC)   Syncope  Resolved Problems:   * No resolved hospital problems. *  Hospital Course: 38 y.o. male with medical history significant for hypertension, insulin -dependent diabetes mellitus, and substance abuse who presents with nausea, hyperglycemia, and syncope admitted for HHS after syncopal event at gas station.  EMS called by attendant, found by EMS to be somnolent and confused.  His CBG was >600.  Brought to ED for same and admitted to stepdown unit for HHS.  Reported having been out of his insulin  for several days at least.    In stepdown x 2 days, able to transition off insulin  drip.  Did have jump of his CBG back to 500 after being taken off drip, semglee  dose adjusted.  Plan per inpatient diabetes manager was to get him his insulins before leaving the hospital.  However on AM 12/14/2023, patient suddenly left hospital AMA.  I was alerted and attempted to circumvent him, but he had already left the room and I could not find him outside or in the lobby.  I therefore refilled his insulin  through the "Orders Only" tab to his pharmacy.     Assessment & Plan: HHS; type II DM  - A1c was 12.9% in May 2025  - Last CBG prior to leaving AMA was 290.     2. Acute encephalopathy  - Improved, transitioned to stepdown - acute metabolic encephalopathy secondary to HHS.    3. AKI  - Continue IVF hydration, renally-dose medications, repeat chem  panel   - resolving.  Likely still fluid down, continue IVF even though he is taking PO.     4. Syncope  - No significant structural cardiac abnormalities on recent echo  - Continue cardiac monitoring, continue IVF hydration and glycemic-control  - secondary to HHS   5. Hypertension  - Norvasc    - BPs acceptable here thus far.         Consultants: none Procedures performed: none  Disposition: left AMA Diet recommendation:  Carb modified diet DISCHARGE MEDICATION: Allergies as of 12/14/2023   No Known Allergies      Medication List     ASK your doctor about these medications    Accu-Chek Softclix Lancets lancets Use to check blood sugar three times daily   amLODipine  5 MG tablet Commonly known as: NORVASC  Take 1 tablet (5 mg total) by mouth daily.   blood glucose meter kit and supplies Kit Use to check blood sugar three times daily   BLOOD GLUCOSE TEST STRIPS Strp Use test strip to check blood sugar three times daily   insulin  glargine 100 unit/mL Sopn Commonly known as: LANTUS  Inject 25 Units into the skin at bedtime.   insulin  lispro 100 UNIT/ML KwikPen Commonly known as: HUMALOG  Inject 8 Units into the skin 3 (three) times daily with meals. Only take if eating a meal AND Blood Glucose (BG) is 80 or higher.   Lancet Device Misc Use 3 (  three) times daily.   ondansetron  4 MG tablet Commonly known as: ZOFRAN  Take 1 tablet (4 mg total) by mouth every 6 (six) hours as needed for nausea.   Pen Needles 31G X 5 MM Misc Use  three times daily        Discharge Exam: Filed Weights   12/12/23 0330 12/13/23 1242  Weight: 88.6 kg 92.9 kg   No exam performed  Condition at discharge: unclear b/c I didn't have a chance to see him   The results of significant diagnostics from this hospitalization (including imaging, microbiology, ancillary and laboratory) are listed below for reference.   Imaging Studies: CT Head Wo Contrast Result Date:  11/29/2023 CLINICAL DATA:  Mental status change. EXAM: CT HEAD WITHOUT CONTRAST TECHNIQUE: Contiguous axial images were obtained from the base of the skull through the vertex without intravenous contrast. RADIATION DOSE REDUCTION: This exam was performed according to the departmental dose-optimization program which includes automated exposure control, adjustment of the mA and/or kV according to patient size and/or use of iterative reconstruction technique. COMPARISON:  None Available.  05/07/2023 FINDINGS: Brain: There is no evidence for acute hemorrhage, hydrocephalus, mass lesion, or abnormal extra-axial fluid collection. No definite CT evidence for acute infarction. Vascular: No hyperdense vessel or unexpected calcification. Skull: No evidence for fracture. No worrisome lytic or sclerotic lesion. Sinuses/Orbits: The visualized paranasal sinuses and mastoid air cells are clear. Visualized portions of the globes and intraorbital fat are unremarkable. Other: None. IMPRESSION: No acute intracranial abnormality. Electronically Signed   By: Donnal Fusi M.D.   On: 11/29/2023 13:11    Microbiology: Results for orders placed or performed during the hospital encounter of 12/11/23  MRSA Next Gen by PCR, Nasal     Status: Abnormal   Collection Time: 12/12/23  3:28 AM   Specimen: Nasal Mucosa; Nasal Swab  Result Value Ref Range Status   MRSA by PCR Next Gen DETECTED (A) NOT DETECTED Final    Comment: (NOTE) The GeneXpert MRSA Assay (FDA approved for NASAL specimens only), is one component of a comprehensive MRSA colonization surveillance program. It is not intended to diagnose MRSA infection nor to guide or monitor treatment for MRSA infections. Test performance is not FDA approved in patients less than 73 years old. Performed at [facility], [address] [place], [place] [address]     Labs: CBC: Recent Labs  Lab 12/11/23 2249 12/11/23 2256 12/13/23 0258  WBC 4.7  --  4.5   HGB 16.1 17.3* 15.6  HCT 47.7 51.0 46.5  MCV 84.3  --  84.7  PLT 271  --  231   Basic Metabolic Panel: Recent Labs  Lab 12/12/23 0103 12/12/23 0549 12/12/23 1329 12/13/23 0258 12/13/23 1756 12/14/23 0553  NA 128* 131* 132* 130*  --  131*  K 4.3 3.6 3.6 3.8  --  3.8  CL 92* 100 99 100  --  105  CO2 26 23 23 24   --  22  GLUCOSE 614* 241* 208* 263* 512* 222*  BUN 23* 20 21* 24*  --  21*  CREATININE 1.54* 1.14 1.08 0.96  --  0.81  CALCIUM  9.8 9.3 9.0 8.4*  --  8.1*   Liver Function Tests: Recent Labs  Lab 12/12/23 0103  AST 24  ALT 22  ALKPHOS 107  BILITOT 0.7  PROT 7.6  ALBUMIN 4.1   CBG: Recent Labs  Lab 12/13/23 2019 12/13/23 2216 12/13/23 2317 12/14/23 0238 12/14/23 0725  GLUCAP 200* 88 175* 234*  290*    Discharge time spent: less than 30 minutes.  Signed: Trenton Frock, MD [facility] 12/14/2023

## 2023-12-14 NOTE — Progress Notes (Signed)
 See DC summary -- patient left AMA.  Sending in his insulin  for him at increased dosage.

## 2023-12-20 ENCOUNTER — Other Ambulatory Visit: Payer: Self-pay

## 2023-12-20 ENCOUNTER — Emergency Department (HOSPITAL_COMMUNITY)
Admission: EM | Admit: 2023-12-20 | Discharge: 2023-12-21 | Disposition: A | Discharge status: Home / Self Care | Attending: Emergency Medicine | Admitting: Emergency Medicine

## 2023-12-20 ENCOUNTER — Encounter (HOSPITAL_COMMUNITY): Payer: Self-pay | Admitting: Emergency Medicine

## 2023-12-20 DIAGNOSIS — R1031 Right lower quadrant pain: Secondary | ICD-10-CM | POA: Diagnosis not present

## 2023-12-20 DIAGNOSIS — I10 Essential (primary) hypertension: Secondary | ICD-10-CM | POA: Diagnosis not present

## 2023-12-20 DIAGNOSIS — R1084 Generalized abdominal pain: Secondary | ICD-10-CM | POA: Insufficient documentation

## 2023-12-20 DIAGNOSIS — R300 Dysuria: Secondary | ICD-10-CM | POA: Diagnosis not present

## 2023-12-20 DIAGNOSIS — R109 Unspecified abdominal pain: Secondary | ICD-10-CM | POA: Diagnosis not present

## 2023-12-20 DIAGNOSIS — R000 Tachycardia, unspecified: Secondary | ICD-10-CM | POA: Diagnosis not present

## 2023-12-20 DIAGNOSIS — R197 Diarrhea, unspecified: Secondary | ICD-10-CM | POA: Insufficient documentation

## 2023-12-20 DIAGNOSIS — M549 Dorsalgia, unspecified: Secondary | ICD-10-CM | POA: Insufficient documentation

## 2023-12-20 NOTE — ED Triage Notes (Signed)
 Pt presents to the ED via [facility] with complaints of RLQ pain x 2-3 months. Pt denies use of drugs, however EMS notes that the patient because intermittently lethargic during the ride and has "pin-point pupils" and he has tried "ICE" recently. Pt is A&Ox4 at this time. Denies N/V, CP or SOB.   CBG-180

## 2023-12-21 ENCOUNTER — Emergency Department (HOSPITAL_COMMUNITY)

## 2023-12-21 DIAGNOSIS — R109 Unspecified abdominal pain: Secondary | ICD-10-CM | POA: Diagnosis not present

## 2023-12-21 LAB — CBC WITH DIFFERENTIAL/PLATELET
Abs Immature Granulocytes: 0.02 10*3/uL (ref 0.00–0.07)
Basophils Absolute: 0.1 10*3/uL (ref 0.0–0.1)
Basophils Relative: 1 %
Eosinophils Absolute: 0.2 10*3/uL (ref 0.0–0.5)
Eosinophils Relative: 3 %
HCT: 45.8 % (ref 39.0–52.0)
Hemoglobin: 15.1 g/dL (ref 13.0–17.0)
Immature Granulocytes: 0 %
Lymphocytes Relative: 38 %
Lymphs Abs: 2.6 10*3/uL (ref 0.7–4.0)
MCH: 28.2 pg (ref 26.0–34.0)
MCHC: 33 g/dL (ref 30.0–36.0)
MCV: 85.6 fL (ref 80.0–100.0)
Monocytes Absolute: 0.8 10*3/uL (ref 0.1–1.0)
Monocytes Relative: 12 %
Neutro Abs: 3.1 10*3/uL (ref 1.7–7.7)
Neutrophils Relative %: 46 %
Platelets: 264 10*3/uL (ref 150–400)
RBC: 5.35 MIL/uL (ref 4.22–5.81)
RDW: 12.5 % (ref 11.5–15.5)
WBC: 6.7 10*3/uL (ref 4.0–10.5)
nRBC: 0 % (ref 0.0–0.2)

## 2023-12-21 LAB — CBG MONITORING, ED: Glucose-Capillary: 161 mg/dL — ABNORMAL HIGH (ref 70–99)

## 2023-12-21 LAB — COMPREHENSIVE METABOLIC PANEL WITH GFR
ALT: 16 U/L (ref 0–44)
AST: 23 U/L (ref 15–41)
Albumin: 3.8 g/dL (ref 3.5–5.0)
Alkaline Phosphatase: 82 U/L (ref 38–126)
Anion gap: 11 (ref 5–15)
BUN: 15 mg/dL (ref 6–20)
CO2: 24 mmol/L (ref 22–32)
Calcium: 9 mg/dL (ref 8.9–10.3)
Chloride: 101 mmol/L (ref 98–111)
Creatinine, Ser: 1.26 mg/dL — ABNORMAL HIGH (ref 0.61–1.24)
GFR, Estimated: >60 mL/min (ref >60)
Glucose, Bld: 107 mg/dL — ABNORMAL HIGH (ref 70–99)
Potassium: 3.5 mmol/L (ref 3.5–5.1)
Sodium: 136 mmol/L (ref 135–145)
Total Bilirubin: 0.5 mg/dL (ref 0.0–1.2)
Total Protein: 7.6 g/dL (ref 6.5–8.1)

## 2023-12-21 MED ORDER — LACTATED RINGERS IV BOLUS
1000.0000 mL | Freq: Once | INTRAVENOUS | Status: AC
Start: 2023-12-21 — End: 2023-12-21
  Administered 2023-12-21: 1000 mL via INTRAVENOUS

## 2023-12-21 MED ORDER — ACETAMINOPHEN 500 MG PO TABS
1000.0000 mg | ORAL_TABLET | Freq: Once | ORAL | Status: AC
  Administered 2023-12-21: 1000 mg via ORAL
  Filled 2023-12-21: qty 2

## 2023-12-21 MED ORDER — IOHEXOL 300 MG/ML  SOLN
100.0000 mL | Freq: Once | INTRAMUSCULAR | Status: AC | PRN
Start: 2023-12-21 — End: 2023-12-21
  Administered 2023-12-21: 100 mL via INTRAVENOUS

## 2023-12-21 NOTE — Discharge Instructions (Signed)
 Your tests looked good.  Please follow-up with your regular doctor.

## 2023-12-21 NOTE — ED Notes (Signed)
 Pt requested a cbg check.  Completed.

## 2023-12-21 NOTE — ED Provider Notes (Signed)
 [facility] Emergency Department Provider Note MRN:  [id number]  Arrival date & time: 12/21/23     Chief Complaint   Abdominal Pain   History of Present Illness   [name] [name] is a 38 y.o. year-old male presents to the ED with chief complaint of abdominal pain, back pain, and flank pain.  States that he has been having the pain for a couple of months.  He denies prior evaluations.  Denies n/v.  He has had some diarrhea.  He states that he had some dysuria, but this has resolved.  Denies new fever.  Denies any treatment PTA.  History provided by patient.   Review of Systems  Pertinent positive and negative review of systems noted in HPI.    Physical Exam   Vitals:   12/20/23 2337 12/21/23 0418  BP: (!) 145/91   Pulse: (!) 109   Resp: 18   Temp: 98.6 F (37 C) 97.8 F (36.6 C)  SpO2: 100%     CONSTITUTIONAL:  non toxic-appearing, NAD NEURO:  Alert and oriented x 3, CN 3-12 grossly intact EYES:  eyes equal and reactive ENT/NECK:  Supple, no stridor  CARDIO:  normal rate, regular rhythm, appears well-perfused  PULM:  No respiratory distress, CTAB GI/GU:  non-distended, no focal abdominal tenderness MSK/SPINE:  No gross deformities, no edema, moves all extremities  SKIN:  no rash, atraumatic   *Additional and/or pertinent findings included in MDM below  Diagnostic and Interventional Summary    EKG Interpretation Date/Time:    Ventricular Rate:    PR Interval:    QRS Duration:    QT Interval:    QTC Calculation:   R Axis:      Text Interpretation:         Labs Reviewed  COMPREHENSIVE METABOLIC PANEL WITH GFR - Abnormal; Notable for the following components:      Result Value   Glucose, Bld 107 (*)    Creatinine, Ser 1.26 (*)    All other components within normal limits  CBG MONITORING, ED - Abnormal; Notable for the following components:   Glucose-Capillary 161 (*)    All other components within normal limits  CBC WITH  DIFFERENTIAL/PLATELET  URINALYSIS, ROUTINE W REFLEX MICROSCOPIC    CT ABDOMEN PELVIS W CONTRAST  Final Result      Medications  lactated ringers  bolus 1,000 mL (1,000 mLs Intravenous Bolus from Bag 12/21/23 0219)  iohexol  (OMNIPAQUE ) 300 MG/ML solution 100 mL (100 mLs Intravenous Contrast Given 12/21/23 0334)  acetaminophen  (TYLENOL ) tablet 1,000 mg (1,000 mg Oral Given 12/21/23 0416)     Procedures  /  Critical Care Procedures  ED Course and Medical Decision Making  I have reviewed the triage vital signs, the nursing notes, and pertinent available records from the EMR.  Social Determinants Affecting Complexity of Care: Patient has no clinically significant social determinants affecting this chief complaint..   ED Course: Clinical Course as of 12/21/23 0542  Sun Dec 21, 2023  0541 CBC with Differential No leukocytosis or anemia [RB]  0541 Comprehensive metabolic panel(!) Creatinine 1.26, given fluids, no significant electrolyte abnormality. [RB]  0541 CT ABDOMEN PELVIS W CONTRAST No obvious free air or fluid [RB]    Clinical Course User Index [RB] Sherel Dikes, PA-C    Medical Decision Making Patient here with acute on chronic abdominal pain.  Labs and imaging ordered.  Patient is drowsy during exam, but doesn't appear toxic or septic.  Vitals are stable.   Labs and  imaging are reassuring.  Patient reassessed.  He is feeling better and agreeable with plan for discharge.  Amount and/or Complexity of Data Reviewed Labs: ordered. Decision-making details documented in ED Course. Radiology: ordered. Decision-making details documented in ED Course.  Risk OTC drugs. Prescription drug management.         Consultants: No consultations were needed in caring for this patient.   Treatment and Plan: Emergency department workup does not suggest an emergent condition requiring admission or immediate intervention beyond  what has been performed at this time. The patient  is safe for discharge and has  been instructed to return immediately for worsening symptoms, change in  symptoms or any other concerns    Final Clinical Impressions(s) / ED Diagnoses     ICD-10-CM   1. Generalized abdominal pain  R10.84       ED Discharge Orders     None         Discharge Instructions Discussed with and Provided to Patient:     Discharge Instructions      Your tests looked good.  Please follow-up with your regular doctor.     Sherel Dikes, PA-C 12/21/23 Lin Rend, April, MD 12/21/23 [phone]

## 2023-12-28 ENCOUNTER — Other Ambulatory Visit: Payer: Self-pay

## 2023-12-28 ENCOUNTER — Emergency Department (HOSPITAL_COMMUNITY)
Admission: EM | Admit: 2023-12-28 | Discharge: 2023-12-29 | Disposition: A | Discharge status: Home / Self Care | Attending: Emergency Medicine | Admitting: Emergency Medicine

## 2023-12-28 DIAGNOSIS — I10 Essential (primary) hypertension: Secondary | ICD-10-CM | POA: Insufficient documentation

## 2023-12-28 DIAGNOSIS — Z79899 Other long term (current) drug therapy: Secondary | ICD-10-CM | POA: Insufficient documentation

## 2023-12-28 DIAGNOSIS — E1165 Type 2 diabetes mellitus with hyperglycemia: Secondary | ICD-10-CM | POA: Insufficient documentation

## 2023-12-28 DIAGNOSIS — R531 Weakness: Secondary | ICD-10-CM | POA: Diagnosis not present

## 2023-12-28 DIAGNOSIS — Z87898 Personal history of other specified conditions: Secondary | ICD-10-CM

## 2023-12-28 DIAGNOSIS — Z794 Long term (current) use of insulin: Secondary | ICD-10-CM | POA: Insufficient documentation

## 2023-12-28 DIAGNOSIS — Z8659 Personal history of other mental and behavioral disorders: Secondary | ICD-10-CM | POA: Diagnosis not present

## 2023-12-28 DIAGNOSIS — E119 Type 2 diabetes mellitus without complications: Secondary | ICD-10-CM

## 2023-12-28 DIAGNOSIS — F1510 Other stimulant abuse, uncomplicated: Secondary | ICD-10-CM | POA: Diagnosis not present

## 2023-12-28 DIAGNOSIS — R030 Elevated blood-pressure reading, without diagnosis of hypertension: Secondary | ICD-10-CM

## 2023-12-28 DIAGNOSIS — R739 Hyperglycemia, unspecified: Secondary | ICD-10-CM

## 2023-12-28 DIAGNOSIS — R5383 Other fatigue: Secondary | ICD-10-CM | POA: Diagnosis present

## 2023-12-28 LAB — RAPID URINE DRUG SCREEN, HOSP PERFORMED
Amphetamines: POSITIVE — AB
Barbiturates: NOT DETECTED
Benzodiazepines: NOT DETECTED
Cocaine: NOT DETECTED
Opiates: NOT DETECTED
Tetrahydrocannabinol: NOT DETECTED

## 2023-12-28 LAB — ETHANOL: Alcohol, Ethyl (B): <15 mg/dL (ref <15)

## 2023-12-28 LAB — URINALYSIS, ROUTINE W REFLEX MICROSCOPIC
Bilirubin Urine: NEGATIVE
Glucose, UA: >=500 mg/dL — AB
Hgb urine dipstick: NEGATIVE
Ketones, ur: NEGATIVE mg/dL
Leukocytes,Ua: NEGATIVE
Nitrite: NEGATIVE
Protein, ur: NEGATIVE mg/dL
Specific Gravity, Urine: 1.024 (ref 1.005–1.030)
pH: 6 (ref 5.0–8.0)

## 2023-12-28 LAB — COMPREHENSIVE METABOLIC PANEL WITH GFR
ALT: 18 U/L (ref 0–44)
AST: 21 U/L (ref 15–41)
Albumin: 3.4 g/dL — ABNORMAL LOW (ref 3.5–5.0)
Alkaline Phosphatase: 75 U/L (ref 38–126)
Anion gap: 9 (ref 5–15)
BUN: 26 mg/dL — ABNORMAL HIGH (ref 6–20)
CO2: 25 mmol/L (ref 22–32)
Calcium: 8.7 mg/dL — ABNORMAL LOW (ref 8.9–10.3)
Chloride: 97 mmol/L — ABNORMAL LOW (ref 98–111)
Creatinine, Ser: 1.24 mg/dL (ref 0.61–1.24)
GFR, Estimated: >60 mL/min (ref >60)
Glucose, Bld: 241 mg/dL — ABNORMAL HIGH (ref 70–99)
Potassium: 4.4 mmol/L (ref 3.5–5.1)
Sodium: 131 mmol/L — ABNORMAL LOW (ref 135–145)
Total Bilirubin: 0.6 mg/dL (ref 0.0–1.2)
Total Protein: 6.7 g/dL (ref 6.5–8.1)

## 2023-12-28 LAB — CBC
HCT: 44.4 % (ref 39.0–52.0)
Hemoglobin: 14.6 g/dL (ref 13.0–17.0)
MCH: 27.7 pg (ref 26.0–34.0)
MCHC: 32.9 g/dL (ref 30.0–36.0)
MCV: 84.1 fL (ref 80.0–100.0)
Platelets: 247 10*3/uL (ref 150–400)
RBC: 5.28 MIL/uL (ref 4.22–5.81)
RDW: 12.5 % (ref 11.5–15.5)
WBC: 7.1 10*3/uL (ref 4.0–10.5)
nRBC: 0 % (ref 0.0–0.2)

## 2023-12-28 MED ORDER — LACTATED RINGERS IV BOLUS
1000.0000 mL | Freq: Once | INTRAVENOUS | Status: AC
  Administered 2023-12-28: 1000 mL via INTRAVENOUS

## 2023-12-28 MED ORDER — AMLODIPINE BESYLATE 5 MG PO TABS
5.0000 mg | ORAL_TABLET | Freq: Once | ORAL | Status: AC
  Administered 2023-12-28: 5 mg via ORAL
  Filled 2023-12-28: qty 1

## 2023-12-28 MED ORDER — NALOXONE HCL 0.4 MG/ML IJ SOLN
0.4000 mg | Freq: Once | INTRAMUSCULAR | Status: AC
  Administered 2023-12-28: 0.4 mg via INTRAVENOUS
  Filled 2023-12-28: qty 1

## 2023-12-28 NOTE — Discharge Instructions (Addendum)
 It was our pleasure to provide your ER care today - we hope that you feel better. Drink plenty of water /fluids/stay well hydrated. Follow diabetes meal plan.  Check blood sugars 4/xday and record values. Follow up closely with primary care doctor in the coming week.   Avoid drug use as it is harmful to your physical health and mental well-being. See resource guide attached in terms of accessing inpatient or outpatient substance use treatment programs.   Follow up closely with primary care doctor and behavioral health provider in the coming week. Also follow up closely with your primary care doctor regarding your blood pressure that is high.   For mental health issues and/or crisis, you may also go directly to the [facility] - they are open 24/7 and walk-ins are welcome.    Return to ER if worse, new symptoms, fevers, new or severe pain, chest pain, trouble breathing, abdominal pain, persistent vomiting, fainting, or other emergency concern.

## 2023-12-28 NOTE — ED Provider Notes (Addendum)
 [place] EMERGENCY DEPARTMENT AT [facility] Provider Note   CSN: [id number] Arrival date & time: 12/28/23  1844     History  Chief Complaint  Patient presents with   Fatigue    [name] [name] is a 38 y.o. male.  Pt arrives via EMS from [facility] where patient was noted to be acting strangely/lethargic. Check CBG was 110. Pt unable to provide additional history - level 5 caveat. No report of trauma/fall. No report of fevers. Hx SUD, hx DM.   The history is provided by the patient, the EMS personnel and medical records. The history is limited by the condition of the patient.       Home Medications Prior to Admission medications   Medication Sig Start Date End Date Taking? Authorizing Provider  amLODipine  (NORVASC ) 5 MG tablet Take 1 tablet (5 mg total) by mouth daily. 12/01/23   Audria Leather, MD  blood glucose meter kit and supplies KIT Use to check blood sugar three times daily 09/17/23   Ofelia Bent S, PA  Glucose Blood (BLOOD GLUCOSE TEST STRIPS) STRP Use test strip to check blood sugar three times daily 09/17/23   Ofelia Bent S, PA  insulin  glargine (LANTUS ) 100 unit/mL SOPN Inject 40 Units into the skin at bedtime. 12/14/23   Trenton Frock, MD  insulin  lispro (HUMALOG ) 100 UNIT/ML KwikPen Inject 8 Units into the skin 3 (three) times daily with meals. Only take if eating a meal AND Blood Glucose (BG) is 80 or higher. 12/14/23   Trenton Frock, MD  Insulin  Pen Needle (PEN NEEDLES) 31G X 5 MM MISC Use  three times daily 09/17/23   Coretta Dexter, PA  Lancet Device MISC Use 3 (three) times daily. 09/17/23   Coretta Dexter, PA  Lancets MISC Use to check blood sugar three times daily 08/26/23   Danford, Willis Harter, MD  ondansetron  (ZOFRAN ) 4 MG tablet Take 1 tablet (4 mg total) by mouth every 6 (six) hours as needed for nausea. 11/30/23   Audria Leather, MD      Allergies    Patient has no known allergies.    Review of Systems   Review of Systems  Unable  to perform ROS: Patient unresponsive    Physical Exam Updated Vital Signs BP (!) 144/111 (BP Location: Right Arm)   Pulse 91   Temp (!) 97.4 F (36.3 C) (Oral)   Resp 14   Ht 1.803 m (5\' 11" )   Wt 93 kg   SpO2 100%   BMI 28.59 kg/m  Physical Exam Vitals and nursing note reviewed.  Constitutional:      Appearance: Normal appearance. He is well-developed.  HENT:     Head: Atraumatic.     Nose: Nose normal.     Mouth/Throat:     Mouth: Mucous membranes are moist.     Pharynx: Oropharynx is clear.  Eyes:     General: No scleral icterus.    Conjunctiva/sclera: Conjunctivae normal.     Comments: Pupils ~ 2 mm, equal.  Neck:     Vascular: No carotid bruit.     Trachea: No tracheal deviation.     Comments: Trachea midline. Thyroid  not grossly enlarged or tender. No neck stiffness or  rigidity.  Cardiovascular:     Rate and Rhythm: Regular rhythm. Tachycardia present.     Pulses: Normal pulses.     Heart sounds: Normal heart sounds. No murmur heard.    No friction rub. No gallop.  Pulmonary:     Effort: Pulmonary effort is normal. No accessory muscle usage or respiratory distress.     Breath sounds: Normal breath sounds.  Abdominal:     General: Bowel sounds are normal. There is no distension.     Palpations: Abdomen is soft.     Tenderness: There is no abdominal tenderness. There is no guarding.  Genitourinary:    Comments: No cva tenderness. Musculoskeletal:        General: No swelling or tenderness.     Cervical back: Normal range of motion and neck supple. No rigidity or tenderness.     Right lower leg: No edema.     Left lower leg: No edema.     Comments: CTLS spine, non tender, aligned, no step off. Good passive rom bil extremities without pain or focal tenderness. Distal pulses palp bil.   Skin:    General: Skin is warm and dry.     Findings: No rash.  Neurological:     Mental Status: He is alert.     Comments: Lethargic, slow to respond. Pupils miotic, equal.  Moves bilateral extremities purposefully, but follows commands very poorly.      ED Results / Procedures / Treatments   Labs (all labs ordered are listed, but only abnormal results are displayed) Results for orders placed or performed during the hospital encounter of 12/28/23  CBC   Collection Time: 12/28/23  7:30 PM  Result Value Ref Range   WBC 7.1 4.0 - 10.5 K/uL   RBC 5.28 4.22 - 5.81 MIL/uL   Hemoglobin 14.6 13.0 - 17.0 g/dL   HCT 16.1 09.6 - 04.5 %   MCV 84.1 80.0 - 100.0 fL   MCH 27.7 26.0 - 34.0 pg   MCHC 32.9 30.0 - 36.0 g/dL   RDW 40.9 81.1 - 91.4 %   Platelets 247 150 - 400 K/uL   nRBC 0.0 0.0 - 0.2 %  Ethanol   Collection Time: 12/28/23  7:30 PM  Result Value Ref Range   Alcohol, Ethyl (B) <15 <15 mg/dL  Comprehensive metabolic panel with GFR   Collection Time: 12/28/23  8:48 PM  Result Value Ref Range   Sodium 131 (L) 135 - 145 mmol/L   Potassium 4.4 3.5 - 5.1 mmol/L   Chloride 97 (L) 98 - 111 mmol/L   CO2 25 22 - 32 mmol/L   Glucose, Bld 241 (H) 70 - 99 mg/dL   BUN 26 (H) 6 - 20 mg/dL   Creatinine, Ser 7.82 0.61 - 1.24 mg/dL   Calcium  8.7 (L) 8.9 - 10.3 mg/dL   Total Protein 6.7 6.5 - 8.1 g/dL   Albumin 3.4 (L) 3.5 - 5.0 g/dL   AST 21 15 - 41 U/L   ALT 18 0 - 44 U/L   Alkaline Phosphatase 75 38 - 126 U/L   Total Bilirubin 0.6 0.0 - 1.2 mg/dL   GFR, Estimated >95 >62 mL/min   Anion gap 9 5 - 15  Rapid urine drug screen (hospital performed)   Collection Time: 12/28/23 10:35 PM  Result Value Ref Range   Opiates NONE DETECTED NONE DETECTED   Cocaine NONE DETECTED NONE DETECTED   Benzodiazepines NONE DETECTED NONE DETECTED   Amphetamines POSITIVE (A) NONE DETECTED   Tetrahydrocannabinol NONE DETECTED NONE DETECTED   Barbiturates NONE DETECTED NONE DETECTED   CT ABDOMEN PELVIS W CONTRAST Result Date: 12/21/2023 CLINICAL DATA:  Acute nonlocalized abdominal pain EXAM: CT ABDOMEN AND PELVIS WITH CONTRAST TECHNIQUE: Multidetector CT imaging  of the abdomen  and pelvis was performed using the standard protocol following bolus administration of intravenous contrast. RADIATION DOSE REDUCTION: This exam was performed according to the departmental dose-optimization program which includes automated exposure control, adjustment of the mA and/or kV according to patient size and/or use of iterative reconstruction technique. CONTRAST:  OMNIPAQUE  IOHEXOL  300 MG/ML  SOLN COMPARISON:  CT abdomen pelvis 04/12/2023 FINDINGS: Lower chest: No acute abnormality. Hepatobiliary: Unremarkable liver, gallbladder, and biliary tree. Pancreas: No acute abnormality.  Pancreatic divisum. Spleen: Unremarkable. Adrenals/Urinary Tract: Normal adrenal glands. No urinary calculi or hydronephrosis. Unremarkable bladder. Stomach/Bowel: Normal caliber large and small bowel. No bowel wall thickening. Stomach and appendix are within normal limits. Vascular/Lymphatic: No significant vascular findings are present. No enlarged abdominal or pelvic lymph nodes. Reproductive: Unremarkable. Other: No free intraperitoneal fluid or air. Musculoskeletal: No acute fracture. IMPRESSION: No acute abnormality in the abdomen or pelvis. Electronically Signed   By: Rozell Cornet M.D.   On: 12/21/2023 03:53   CT Head Wo Contrast Result Date: 11/29/2023 CLINICAL DATA:  Mental status change. EXAM: CT HEAD WITHOUT CONTRAST TECHNIQUE: Contiguous axial images were obtained from the base of the skull through the vertex without intravenous contrast. RADIATION DOSE REDUCTION: This exam was performed according to the departmental dose-optimization program which includes automated exposure control, adjustment of the mA and/or kV according to patient size and/or use of iterative reconstruction technique. COMPARISON:  None Available.  05/07/2023 FINDINGS: Brain: There is no evidence for acute hemorrhage, hydrocephalus, mass lesion, or abnormal extra-axial fluid collection. No definite CT evidence for acute infarction.  Vascular: No hyperdense vessel or unexpected calcification. Skull: No evidence for fracture. No worrisome lytic or sclerotic lesion. Sinuses/Orbits: The visualized paranasal sinuses and mastoid air cells are clear. Visualized portions of the globes and intraorbital fat are unremarkable. Other: None. IMPRESSION: No acute intracranial abnormality. Electronically Signed   By: Donnal Fusi M.D.   On: 11/29/2023 13:11     EKG None  Radiology No results found.  Procedures Procedures    Medications Ordered in ED Medications  amLODipine  (NORVASC ) tablet 5 mg (has no administration in time range)  naloxone  (NARCAN ) injection 0.4 mg (0.4 mg Intravenous Given 12/28/23 1935)  lactated ringers  bolus 1,000 mL (0 mLs Intravenous Stopped 12/28/23 2051)  lactated ringers  bolus 1,000 mL (1,000 mLs Intravenous New Bag/Given 12/28/23 2220)    ED Course/ Medical Decision Making/ A&P                                 Medical Decision Making Problems Addressed: Elevated blood pressure reading: acute illness or injury Essential hypertension: chronic illness or injury with exacerbation, progression, or side effects of treatment that poses a threat to life or bodily functions History of substance use disorder: chronic illness or injury Hyperglycemia: acute illness or injury with systemic symptoms that poses a threat to life or bodily functions Insulin  dependent type 2 diabetes mellitus (HCC): chronic illness or injury with exacerbation, progression, or side effects of treatment that poses a threat to life or bodily functions Methamphetamine abuse (HCC): acute illness or injury  Amount and/or Complexity of Data Reviewed Independent Historian: EMS    Details: hx External Data Reviewed: notes. Labs: ordered. Decision-making details documented in ED Course.  Risk Prescription drug management. Decision regarding hospitalization.   Iv ns. Continuous pulse ox and cardiac monitoring. Labs ordered/sent.    Differential diagnosis includes  . Dispo decision including potential  need for admission considered - will get labs and reassess.   Reviewed nursing notes and prior charts for additional history. External reports reviewed. Additional history from: EMS.   Cardiac monitor: sinus rhythm, rate 102.   Narcan  iv.   Labs reviewed/interpreted by me - glucose elevated, 241, hco3 normal, ag normal. Wbc and hgb normal. Etoh neg. UDS + amphetamine.  Additional ivf.  Recheck awake and alert, answers questions appropriately, currently denies pain or other physical complaint - is asking for something to eat and drink - provided.   Po fluids/food. Recheck. Pt fully awake and alert. Indicates feels fine. No complaint of pain. No headache. No neck/back pain. No chest pain or sob. No abd pain or nv.  Has ate/drank well.   Pt has not taken his bp med today - pt given dose of his normal bp med.   Pt currently appears stable for ED d/c.   Rec close pcp f/u.  Return precautions provided.          Final Clinical Impression(s) / ED Diagnoses Final diagnoses:  Hyperglycemia  History of substance use disorder  Elevated blood pressure reading  Essential hypertension  Insulin  dependent type 2 diabetes mellitus (HCC)  Methamphetamine abuse (HCC)    Rx / DC Orders ED Discharge Orders     None            Guadalupe Lee, MD 12/28/23 2300

## 2023-12-28 NOTE — ED Triage Notes (Signed)
 Pt bib EMS from [facility]. Pt appears lethargic upon EMS arrival. Pt found with nutella in hand. Pt found with pin point pupils per EMS. Pt known diabetic. Per EMS no complaints when able to rouse pt. BP 156/98 P 104 CBG 130 most recent initial was 110 at [facility]

## 2023-12-28 NOTE — ED Notes (Signed)
 PT given graham crackers and peanut butter per request. Two diet cokes w/ ice to drink.

## 2024-01-01 MED FILL — Lactated Ringer's Solution: INTRAVENOUS | Qty: 1000 | Status: AC

## 2024-01-02 ENCOUNTER — Emergency Department (HOSPITAL_COMMUNITY)
Admission: EM | Admit: 2024-01-02 | Discharge: 2024-01-03 | Disposition: A | Discharge status: Home / Self Care | Attending: Emergency Medicine | Admitting: Emergency Medicine

## 2024-01-02 ENCOUNTER — Encounter (HOSPITAL_COMMUNITY): Payer: Self-pay

## 2024-01-02 ENCOUNTER — Other Ambulatory Visit: Payer: Self-pay

## 2024-01-02 DIAGNOSIS — I10 Essential (primary) hypertension: Secondary | ICD-10-CM | POA: Insufficient documentation

## 2024-01-02 DIAGNOSIS — R519 Headache, unspecified: Secondary | ICD-10-CM

## 2024-01-02 DIAGNOSIS — T675XXA Heat exhaustion, unspecified, initial encounter: Secondary | ICD-10-CM | POA: Diagnosis not present

## 2024-01-02 DIAGNOSIS — G4489 Other headache syndrome: Secondary | ICD-10-CM | POA: Diagnosis not present

## 2024-01-02 DIAGNOSIS — R030 Elevated blood-pressure reading, without diagnosis of hypertension: Secondary | ICD-10-CM

## 2024-01-02 MED ORDER — ACETAMINOPHEN 500 MG PO TABS: 1000.0000 mg | ORAL_TABLET | Freq: Once | ORAL | Status: DC

## 2024-01-02 NOTE — ED Notes (Signed)
 Labs were attempted. Pt is hard stick stated they normally use the ultrasound on him

## 2024-01-02 NOTE — ED Notes (Signed)
 Pt arrived by ems today for heat exhaustion. Pt endorsed no dizziness or vomitting, but was sweaty. HR 98, 115/78, RR14, 98%RA. CBG 165, hx diabetes, homeslessness

## 2024-01-02 NOTE — ED Triage Notes (Signed)
 Patient reports having a headache because his blood pressure was high. States he does not remember what his blood pressure is. States he takes blood pressure mediation at home, lisinopril . Also reports chronic back pain.

## 2024-01-02 NOTE — ED Provider Triage Note (Signed)
 Emergency Medicine Provider Triage Evaluation Note  [name] [name] , a 38 y.o. male  was evaluated in triage.  Pt complains of headache starting today. Patient states that he feels like he got dehydrated when working outside. Denies it acutely being the worst headache of his life. Per chart review patient had a recent CT scan done one month ago.   Review of Systems  Positive: Headache, 1 episode of vomiting  Negative: Fever, chills,   Physical Exam  BP 118/88   Pulse (!) 104   Temp 98 F (36.7 C)   Resp 19   SpO2 98%  Gen:   Awake, no distress   Resp:  Normal effort  MSK:   Moves extremities without difficulty  Other:  EOMS intact, pupils equal and reactive to light, sensation intact, grossly normal neurological exam   Medical Decision Making  Medically screening exam initiated at 8:03 PM.  Appropriate orders placed.  [name] [name] was informed that the remainder of the evaluation will be completed by another provider, this initial triage assessment does not replace that evaluation, and the importance of remaining in the ED until their evaluation is complete.  Orders CBC, BMP Waiting to consider CT scan, patient has grossly neuro exam in triage, CT scan one month ago unremarkable   Fonda Hymen, [place] 01/02/24 2011

## 2024-01-03 ENCOUNTER — Other Ambulatory Visit (HOSPITAL_COMMUNITY): Payer: Self-pay

## 2024-01-03 MED ORDER — AMLODIPINE BESYLATE 5 MG PO TABS
5.0000 mg | ORAL_TABLET | Freq: Once | ORAL | Status: AC
  Administered 2024-01-03: 5 mg via ORAL
  Filled 2024-01-03: qty 1

## 2024-01-03 MED ORDER — AMLODIPINE BESYLATE 5 MG PO TABS
5.0000 mg | ORAL_TABLET | Freq: Every day | ORAL | 0 refills | Status: DC
  Filled 2024-01-03 – 2024-01-15 (×2): qty 30, 30d supply, fill #0

## 2024-01-03 MED ORDER — OXYCODONE-ACETAMINOPHEN 5-325 MG PO TABS
2.0000 | ORAL_TABLET | Freq: Once | ORAL | Status: AC
  Administered 2024-01-03: 2 via ORAL
  Filled 2024-01-03: qty 2

## 2024-01-03 NOTE — ED Provider Notes (Signed)
 [facility] Emergency Department Provider Note MRN:  [id number]  Arrival date & time: 01/03/24     Chief Complaint   Headache and Hypertension   History of Present Illness   [name] [name] is a 38 y.o. year-old male presents to the ED with chief complaint of elevated blood pressure.  He states that it was too hot outside today and he thinks this caused his blood pressure to go up.  He reports have associated headache.  He states that he is feeling better now since having come to the ER about 9 hours ago.  He states that he is out of his BP medication and would like a refill.  Denies recent fever or chills.  Denies any other new complaints.  History provided by patient.   Review of Systems  Pertinent positive and negative review of systems noted in HPI.    Physical Exam   Vitals:   01/03/24 0445 01/03/24 0500  BP: (!) 141/101 (!) 137/91  Pulse: 70 85  Resp: 17 17  Temp:    SpO2: 98% 97%    CONSTITUTIONAL:  non toxic-appearing, NAD NEURO:  Alert and oriented x 3, CN 3-12 grossly intact EYES:  eyes equal and reactive ENT/NECK:  Supple, no stridor  CARDIO:  normal rate, regular rhythm, appears well-perfused  PULM:  No respiratory distress, CTAB GI/GU:  non-distended,  MSK/SPINE:  No gross deformities, no edema, moves all extremities  SKIN:  no rash, atraumatic   *Additional and/or pertinent findings included in MDM below  Diagnostic and Interventional Summary    EKG Interpretation Date/Time:    Ventricular Rate:    PR Interval:    QRS Duration:    QT Interval:    QTC Calculation:   R Axis:      Text Interpretation:         Labs Reviewed - No data to display  No orders to display    Medications  amLODipine  (NORVASC ) tablet 5 mg (5 mg Oral Given 01/03/24 0350)  oxyCODONE -acetaminophen  (PERCOCET/ROXICET) 5-325 MG per tablet 2 tablet (2 tablets Oral Given 01/03/24 0351)     Procedures  /  Critical Care Procedures  ED Course and  Medical Decision Making  I have reviewed the triage vital signs, the nursing notes, and pertinent available records from the EMR.  Social Determinants Affecting Complexity of Care: Patient has no clinically significant social determinants affecting this chief complaint..   ED Course:    Medical Decision Making Patient here with elevated BP and headache.  Will give dose of home BP meds and treat headache.  Overall, he states that he is feeling better.  Will see how his BP responds to treatment and will reassess.  Anticipate discharge.  Risk Prescription drug management.         Consultants: No consultations were needed in caring for this patient.   Treatment and Plan: I considered admission due to patient's initial presentation, but after considering the examination and diagnostic results, patient will not require admission and can be discharged with outpatient follow-up.    Final Clinical Impressions(s) / ED Diagnoses     ICD-10-CM   1. Elevated blood pressure reading  R03.0     2. Nonintractable headache, unspecified chronicity pattern, unspecified headache type  R51.9       ED Discharge Orders          Ordered    amLODipine  (NORVASC ) 5 MG tablet  Daily        01/03/24 0525  Discharge Instructions Discussed with and Provided to Patient:   Discharge Instructions   None      Sherel Dikes, PA-C 01/03/24 0544    Lindle Rhea, MD 01/03/24 0800

## 2024-01-03 NOTE — ED Notes (Signed)
Pt given bus pass ?

## 2024-01-15 ENCOUNTER — Other Ambulatory Visit: Payer: Self-pay

## 2024-01-15 ENCOUNTER — Other Ambulatory Visit (HOSPITAL_COMMUNITY): Payer: Self-pay

## 2024-01-26 ENCOUNTER — Other Ambulatory Visit (HOSPITAL_COMMUNITY): Payer: Self-pay

## 2024-02-06 ENCOUNTER — Emergency Department (HOSPITAL_COMMUNITY)

## 2024-02-06 ENCOUNTER — Encounter (HOSPITAL_COMMUNITY): Payer: Self-pay

## 2024-02-06 ENCOUNTER — Other Ambulatory Visit: Payer: Self-pay

## 2024-02-06 ENCOUNTER — Observation Stay (HOSPITAL_COMMUNITY)
Admission: EM | Admit: 2024-02-06 | Discharge: 2024-02-07 | Discharge status: Against Medical Advice | Attending: Internal Medicine | Admitting: Internal Medicine

## 2024-02-06 DIAGNOSIS — R0789 Other chest pain: Secondary | ICD-10-CM | POA: Diagnosis not present

## 2024-02-06 DIAGNOSIS — R079 Chest pain, unspecified: Secondary | ICD-10-CM | POA: Diagnosis not present

## 2024-02-06 DIAGNOSIS — I10 Essential (primary) hypertension: Secondary | ICD-10-CM | POA: Diagnosis not present

## 2024-02-06 DIAGNOSIS — Z794 Long term (current) use of insulin: Secondary | ICD-10-CM | POA: Insufficient documentation

## 2024-02-06 DIAGNOSIS — R7989 Other specified abnormal findings of blood chemistry: Secondary | ICD-10-CM | POA: Diagnosis present

## 2024-02-06 DIAGNOSIS — R110 Nausea: Secondary | ICD-10-CM | POA: Diagnosis not present

## 2024-02-06 DIAGNOSIS — E1100 Type 2 diabetes mellitus with hyperosmolarity without nonketotic hyperglycemic-hyperosmolar coma (NKHHC): Secondary | ICD-10-CM | POA: Diagnosis present

## 2024-02-06 DIAGNOSIS — E1165 Type 2 diabetes mellitus with hyperglycemia: Principal | ICD-10-CM | POA: Insufficient documentation

## 2024-02-06 DIAGNOSIS — R739 Hyperglycemia, unspecified: Principal | ICD-10-CM

## 2024-02-06 DIAGNOSIS — Z5900 Homelessness unspecified: Secondary | ICD-10-CM | POA: Diagnosis not present

## 2024-02-06 DIAGNOSIS — Z720 Tobacco use: Secondary | ICD-10-CM | POA: Diagnosis present

## 2024-02-06 DIAGNOSIS — E1159 Type 2 diabetes mellitus with other circulatory complications: Secondary | ICD-10-CM

## 2024-02-06 DIAGNOSIS — I152 Hypertension secondary to endocrine disorders: Secondary | ICD-10-CM | POA: Diagnosis not present

## 2024-02-06 DIAGNOSIS — R0989 Other specified symptoms and signs involving the circulatory and respiratory systems: Secondary | ICD-10-CM | POA: Diagnosis not present

## 2024-02-06 LAB — CBC WITH DIFFERENTIAL/PLATELET
Abs Immature Granulocytes: 0.01 K/uL (ref 0.00–0.07)
Basophils Absolute: 0 K/uL (ref 0.0–0.1)
Basophils Relative: 1 %
Eosinophils Absolute: 0.1 K/uL (ref 0.0–0.5)
Eosinophils Relative: 3 %
HCT: 41.7 % (ref 39.0–52.0)
Hemoglobin: 14 g/dL (ref 13.0–17.0)
Immature Granulocytes: 0 %
Lymphocytes Relative: 36 %
Lymphs Abs: 1.6 K/uL (ref 0.7–4.0)
MCH: 28.5 pg (ref 26.0–34.0)
MCHC: 33.6 g/dL (ref 30.0–36.0)
MCV: 84.9 fL (ref 80.0–100.0)
Monocytes Absolute: 0.4 K/uL (ref 0.1–1.0)
Monocytes Relative: 9 %
Neutro Abs: 2.2 K/uL (ref 1.7–7.7)
Neutrophils Relative %: 51 %
Platelets: 181 K/uL (ref 150–400)
RBC: 4.91 MIL/uL (ref 4.22–5.81)
RDW: 12.4 % (ref 11.5–15.5)
WBC: 4.4 K/uL (ref 4.0–10.5)
nRBC: 0 % (ref 0.0–0.2)

## 2024-02-06 LAB — URINALYSIS, W/ REFLEX TO CULTURE (INFECTION SUSPECTED)
Bacteria, UA: NONE SEEN
Bilirubin Urine: NEGATIVE
Glucose, UA: >=500 mg/dL — AB
Hgb urine dipstick: NEGATIVE
Ketones, ur: NEGATIVE mg/dL
Leukocytes,Ua: NEGATIVE
Nitrite: NEGATIVE
Protein, ur: NEGATIVE mg/dL
Specific Gravity, Urine: 1.026 (ref 1.005–1.030)
pH: 6 (ref 5.0–8.0)

## 2024-02-06 LAB — TROPONIN I (HIGH SENSITIVITY)
Troponin I (High Sensitivity): 6 ng/L (ref <18)
Troponin I (High Sensitivity): 6 ng/L (ref <18)

## 2024-02-06 LAB — COMPREHENSIVE METABOLIC PANEL WITH GFR
ALT: 11 U/L (ref 0–44)
AST: 29 U/L (ref 15–41)
Albumin: 2.9 g/dL — ABNORMAL LOW (ref 3.5–5.0)
Alkaline Phosphatase: 69 U/L (ref 38–126)
Anion gap: 11 (ref 5–15)
BUN: 14 mg/dL (ref 6–20)
CO2: 22 mmol/L (ref 22–32)
Calcium: 8.1 mg/dL — ABNORMAL LOW (ref 8.9–10.3)
Chloride: 94 mmol/L — ABNORMAL LOW (ref 98–111)
Creatinine, Ser: 1.19 mg/dL (ref 0.61–1.24)
GFR, Estimated: >60 mL/min (ref >60)
Glucose, Bld: 630 mg/dL (ref 70–99)
Potassium: 5 mmol/L (ref 3.5–5.1)
Sodium: 127 mmol/L — ABNORMAL LOW (ref 135–145)
Total Bilirubin: 0.5 mg/dL (ref 0.0–1.2)
Total Protein: 5.6 g/dL — ABNORMAL LOW (ref 6.5–8.1)

## 2024-02-06 LAB — I-STAT VENOUS BLOOD GAS, ED
Acid-Base Excess: 2 mmol/L (ref 0.0–2.0)
Bicarbonate: 28.2 mmol/L — ABNORMAL HIGH (ref 20.0–28.0)
Calcium, Ion: 1.05 mmol/L — ABNORMAL LOW (ref 1.15–1.40)
HCT: 42 % (ref 39.0–52.0)
Hemoglobin: 14.3 g/dL (ref 13.0–17.0)
O2 Saturation: 80 %
Potassium: 4.9 mmol/L (ref 3.5–5.1)
Sodium: 129 mmol/L — ABNORMAL LOW (ref 135–145)
TCO2: 30 mmol/L (ref 22–32)
pCO2, Ven: 50.2 mmHg (ref 44–60)
pH, Ven: 7.358 (ref 7.25–7.43)
pO2, Ven: 46 mmHg — ABNORMAL HIGH (ref 32–45)

## 2024-02-06 LAB — CBG MONITORING, ED
Glucose-Capillary: 107 mg/dL — ABNORMAL HIGH (ref 70–99)
Glucose-Capillary: 382 mg/dL — ABNORMAL HIGH (ref 70–99)
Glucose-Capillary: >600 mg/dL (ref 70–99)

## 2024-02-06 LAB — RAPID URINE DRUG SCREEN, HOSP PERFORMED
Amphetamines: POSITIVE — AB
Barbiturates: NOT DETECTED
Benzodiazepines: NOT DETECTED
Cocaine: NOT DETECTED
Opiates: NOT DETECTED
Tetrahydrocannabinol: NOT DETECTED

## 2024-02-06 LAB — BETA-HYDROXYBUTYRIC ACID: Beta-Hydroxybutyric Acid: 0.28 mmol/L — ABNORMAL HIGH (ref 0.05–0.27)

## 2024-02-06 LAB — OSMOLALITY: Osmolality: 302 mosm/kg — ABNORMAL HIGH (ref 275–295)

## 2024-02-06 MED ORDER — LACTATED RINGERS IV BOLUS
1000.0000 mL | Freq: Once | INTRAVENOUS | Status: AC
  Administered 2024-02-06: 1000 mL via INTRAVENOUS

## 2024-02-06 MED ORDER — DEXTROSE IN LACTATED RINGERS 5 % IV SOLN: INTRAVENOUS | Status: DC

## 2024-02-06 MED ORDER — DEXTROSE 50 % IV SOLN: 0.0000 mL | INTRAVENOUS | Status: DC | PRN

## 2024-02-06 MED ORDER — POTASSIUM CHLORIDE 10 MEQ/100ML IV SOLN
10.0000 meq | INTRAVENOUS | Status: AC
  Administered 2024-02-06 (×2): 10 meq via INTRAVENOUS
  Filled 2024-02-06 (×2): qty 100

## 2024-02-06 MED ORDER — INSULIN REGULAR(HUMAN) IN NACL 100-0.9 UT/100ML-% IV SOLN
INTRAVENOUS | Status: DC
  Administered 2024-02-06: 18 [IU]/h via INTRAVENOUS
  Filled 2024-02-06: qty 100

## 2024-02-06 MED ORDER — LACTATED RINGERS IV BOLUS
20.0000 mL/kg | Freq: Once | INTRAVENOUS | Status: AC
  Administered 2024-02-06: 1860 mL via INTRAVENOUS

## 2024-02-06 MED ORDER — INSULIN REGULAR(HUMAN) IN NACL 100-0.9 UT/100ML-% IV SOLN: INTRAVENOUS | Status: DC

## 2024-02-06 MED ORDER — AMLODIPINE BESYLATE 5 MG PO TABS
5.0000 mg | ORAL_TABLET | Freq: Every day | ORAL | Status: DC
Start: 2024-02-07 — End: 2024-02-07

## 2024-02-06 MED ORDER — ENOXAPARIN SODIUM 40 MG/0.4ML IJ SOSY: 40.0000 mg | PREFILLED_SYRINGE | INTRAMUSCULAR | Status: DC

## 2024-02-06 MED ORDER — LACTATED RINGERS IV SOLN: INTRAVENOUS | Status: DC

## 2024-02-06 NOTE — ED Notes (Signed)
 Called and placed PT on monitor with CCMD.

## 2024-02-06 NOTE — ED Triage Notes (Signed)
 PT BIB [facility] from [facility] [place]. PT is homeless and unable to afford medication and has been out x 4 days.PT is suffering frompossible heat exhaustion and has limited outtake of water . PT c/o midsternal CP and right arm pain with nausea. PT given 4mg  of zofran , 324 ASA and 0.4mg  NTG. Also given 700cc of fluid. CBG was 520.

## 2024-02-06 NOTE — ED Provider Notes (Addendum)
 [place] EMERGENCY DEPARTMENT AT [facility] Provider Note   CSN: [id number] Arrival date & time: 02/06/24  1837     Patient presents with: Hyperglycemia  HPI [name] [name] is a 38 y.o. male with type 2 diabetes, history of DKA, amphetamine use, MSSA bacteremia, currently homeless, hypertension presenting for hyperglycemia and chest pain.  He states the chest pain started 2 days ago.  It is in the center of his chest and radiates to both his shoulders but not to his back.  It is sharp pain.  Can be worse with exertion at times but not with deep breathing.  Endorses associated shortness of breath.  Also reported that he has been out of his insulin  for a few days.  Denies any illicit drug use.   HPI     Prior to Admission medications   Medication Sig Start Date End Date Taking? Authorizing Provider  amLODipine  (NORVASC ) 5 MG tablet Take 1 tablet (5 mg total) by mouth daily. 01/03/24   Vicky Charleston, PA-C  blood glucose meter kit and supplies KIT Use to check blood sugar three times daily 09/17/23   Neldon Inoue S, PA  Glucose Blood (BLOOD GLUCOSE TEST STRIPS) STRP Use test strip to check blood sugar three times daily 09/17/23   Neldon Inoue S, PA  insulin  glargine (LANTUS ) 100 unit/mL SOPN Inject 40 Units into the skin at bedtime. 12/14/23   Elpidio Reyes DEL, MD  insulin  lispro (HUMALOG ) 100 UNIT/ML KwikPen Inject 8 Units into the skin 3 (three) times daily with meals. Only take if eating a meal AND Blood Glucose (BG) is 80 or higher. 12/14/23   Elpidio Reyes DEL, MD  Insulin  Pen Needle (PEN NEEDLES) 31G X 5 MM MISC Use  three times daily 09/17/23   Neldon Inoue RAMAN, PA  Lancet Device MISC Use 3 (three) times daily. 09/17/23   Neldon Inoue RAMAN, PA  Lancets MISC Use to check blood sugar three times daily 08/26/23   Danford, Lonni SQUIBB, MD  ondansetron  (ZOFRAN ) 4 MG tablet Take 1 tablet (4 mg total) by mouth every 6 (six) hours as needed for nausea. 11/30/23   Cheryle Page, MD     Allergies: Patient has no known allergies.    Review of Systems See HPI  Updated Vital Signs BP 131/75   Pulse 82   Temp 98 F (36.7 C) (Oral)   Resp (!) 22   Ht 5' 11 (1.803 m)   Wt 93 kg   SpO2 98%   BMI 28.60 kg/m   Physical Exam Vitals and nursing note reviewed.  HENT:     Head: Normocephalic and atraumatic.     Mouth/Throat:     Mouth: Mucous membranes are moist.  Eyes:     General:        Right eye: No discharge.        Left eye: No discharge.     Conjunctiva/sclera: Conjunctivae normal.  Cardiovascular:     Rate and Rhythm: Normal rate and regular rhythm.     Pulses: Normal pulses.     Heart sounds: Normal heart sounds.  Pulmonary:     Effort: Pulmonary effort is normal.     Breath sounds: Normal breath sounds.  Abdominal:     General: Abdomen is flat.     Palpations: Abdomen is soft.  Skin:    General: Skin is warm and dry.  Neurological:     General: No focal deficit present.  Psychiatric:  Mood and Affect: Mood normal.     (all labs ordered are listed, but only abnormal results are displayed) Labs Reviewed  BETA-HYDROXYBUTYRIC ACID - Abnormal; Notable for the following components:      Result Value   Beta-Hydroxybutyric Acid 0.28 (*)    All other components within normal limits  COMPREHENSIVE METABOLIC PANEL WITH GFR - Abnormal; Notable for the following components:   Sodium 127 (*)    Chloride 94 (*)    Glucose, Bld 630 (*)    Calcium  8.1 (*)    Total Protein 5.6 (*)    Albumin 2.9 (*)    All other components within normal limits  CBG MONITORING, ED - Abnormal; Notable for the following components:   Glucose-Capillary >600 (*)    All other components within normal limits  I-STAT VENOUS BLOOD GAS, ED - Abnormal; Notable for the following components:   pO2, Ven 46 (*)    Bicarbonate 28.2 (*)    Sodium 129 (*)    Calcium , Ion 1.05 (*)    All other components within normal limits  CBC WITH DIFFERENTIAL/PLATELET  URINALYSIS, W/  REFLEX TO CULTURE (INFECTION SUSPECTED)  RAPID URINE DRUG SCREEN, HOSP PERFORMED  OSMOLALITY  CBG MONITORING, ED  TROPONIN I (HIGH SENSITIVITY)  TROPONIN I (HIGH SENSITIVITY)    EKG: EKG Interpretation Date/Time:  Friday February 06 2024 18:43:19 EDT Ventricular Rate:  80 PR Interval:  140 QRS Duration:  110 QT Interval:  373 QTC Calculation: 431 R Axis:   -62  Text Interpretation: Sinus rhythm Confirmed by Ruthe Cornet [phone]) on 02/06/2024 7:10:23 PM  Radiology: [facility] Chest 1 View Result Date: 02/06/2024 CLINICAL DATA:  cp EXAM: CHEST  1 VIEW COMPARISON:  October 21, 2023 FINDINGS: Low lung volumes. No focal airspace consolidation, pleural effusion, or pneumothorax. No cardiomegaly.No acute fracture or destructive lesion. IMPRESSION: Low lung volumes.  Otherwise, no acute cardiopulmonary abnormality. Electronically Signed   By: Rogelia Myers M.D.   On: 02/06/2024 19:25     .Critical Care  Performed by: Lang Norleen POUR, PA-C Authorized by: Lang Norleen POUR, PA-C   Critical care provider statement:    Critical care time (minutes):  30   Critical care was necessary to treat or prevent imminent or life-threatening deterioration of the following conditions:  Metabolic crisis (concern for HHS)   Critical care was time spent personally by me on the following activities:  Development of treatment plan with patient or surrogate, discussions with consultants, evaluation of patient's response to treatment, examination of patient, ordering and review of laboratory studies, ordering and review of radiographic studies, ordering and performing treatments and interventions, pulse oximetry, re-evaluation of patient's condition and review of old charts    Medications Ordered in the ED  insulin  regular, human (MYXREDLIN ) 100 units/ 100 mL infusion (18 Units/hr Intravenous New Bag/Given 02/06/24 2137)  lactated ringers  infusion (has no administration in time range)  dextrose  5 % in lactated ringers   infusion (has no administration in time range)  dextrose  50 % solution 0-50 mL (has no administration in time range)  potassium chloride  10 mEq in 100 mL IVPB (has no administration in time range)  lactated ringers  bolus 1,000 mL (0 mLs Intravenous Stopped 02/06/24 2130)  lactated ringers  bolus 1,860 mL (1,860 mLs Intravenous New Bag/Given 02/06/24 2133)                                    Medical  Decision Making Amount and/or Complexity of Data Reviewed Labs: ordered. Radiology: ordered.  Risk Prescription drug management. Decision regarding hospitalization.   Initial Impression and Ddx 38 year old well-appearing male presenting for chest pain and hyperglycemia.  Exam was unremarkable.  DDx includes DKA, HHS, ACS, PE, pneumothorax, other metabolic derangement, sepsis, endocarditis, other. Patient PMH that increases complexity of ED encounter:   type 2 diabetes, history of DKA, amphetamine use, MSSA bacteremia, currently homeless, hypertension  Interpretation of Diagnostics - I independent reviewed and interpreted the labs as followed: hyperglycemia (630),   - I independently visualized the following imaging with scope of interpretation limited to determining acute life threatening conditions related to emergency care: CXR, which revealed low lung volumes but no other acute findings  -I personally reviewed interpret EKG which revealed sinus rhythm  Patient Reassessment and Ultimate Disposition/Management Workup suggestive of hyperglycemia versus HHS.  Suspect it is related to poor bed compliance.  Chest pain workup overall reassuring.  Did consider PE but unlikely given he is PERC negative.  Admitted to hospital service with Dr. Franky for further evaluation and management.  Patient management required discussion with the following services or consulting groups:  Hospitalist Service  Complexity of Problems Addressed Acute complicated illness or Injury  Additional Data Reviewed  and Analyzed Further history obtained from: Past medical history and medications listed in the EMR and Prior ED visit notes  Patient Encounter Risk Assessment Consideration of hospitalization     Final diagnoses:  Hyperglycemia  Chest pain, unspecified type    ED Discharge Orders     None         Lang Norleen POUR, PA-C 02/06/24 2154    Ruthe Cornet, DO 02/06/24 2307

## 2024-02-06 NOTE — H&P (Incomplete)
 History and Physical    MARUICE PIERONI FMW:[id number] DOB: 1985/09/13 DOA: 02/06/2024  Patient coming from: ***  Chief Complaint: ***  HPI: [name] [name] is a 38 y.o. male with ***   ED Course: ***  Review of Systems: As per HPI, rest all negative.   Past Medical History:  Diagnosis Date   Diabetes mellitus without complication (HCC)    DKA (diabetic ketoacidosis) (HCC) 05/08/2022   HTN (hypertension) 05/08/2022   Hyperlipidemia    Hyperosmolar hyperglycemic state (HHS) (HCC) 05/08/2022   Hypertension    Hypoglycemia 07/02/2019   Pseudohyponatremia 05/08/2022    Past Surgical History:  Procedure Laterality Date   HAND SURGERY       reports that he has been smoking cigarettes. He has never used smokeless tobacco. He reports current drug use. Drugs: Amphetamines and Marijuana. He reports that he does not drink alcohol.  No Known Allergies  Family History  Family history unknown: Yes    Prior to Admission medications   Medication Sig Start Date End Date Taking? Authorizing Provider  amLODipine  (NORVASC ) 5 MG tablet Take 1 tablet (5 mg total) by mouth daily. 01/03/24   Vicky Charleston, PA-C  blood glucose meter kit and supplies KIT Use to check blood sugar three times daily 09/17/23   Neldon Inoue S, PA  Glucose Blood (BLOOD GLUCOSE TEST STRIPS) STRP Use test strip to check blood sugar three times daily 09/17/23   Neldon Inoue S, PA  insulin  glargine (LANTUS ) 100 unit/mL SOPN Inject 40 Units into the skin at bedtime. 12/14/23   Elpidio Reyes DEL, MD  insulin  lispro (HUMALOG ) 100 UNIT/ML KwikPen Inject 8 Units into the skin 3 (three) times daily with meals. Only take if eating a meal AND Blood Glucose (BG) is 80 or higher. 12/14/23   Elpidio Reyes DEL, MD  Insulin  Pen Needle (PEN NEEDLES) 31G X 5 MM MISC Use  three times daily 09/17/23   Neldon Inoue RAMAN, PA  Lancet Device MISC Use 3 (three) times daily. 09/17/23   Neldon Inoue RAMAN, PA  Lancets MISC Use to check blood sugar  three times daily 08/26/23   Danford, Lonni SQUIBB, MD  ondansetron  (ZOFRAN ) 4 MG tablet Take 1 tablet (4 mg total) by mouth every 6 (six) hours as needed for nausea. 11/30/23   Cheryle Page, MD    Physical Exam: Constitutional: *** Vitals:   02/06/24 1859 02/06/24 1930 02/06/24 2015 02/06/24 2146  BP:  131/75 126/74   Pulse:   100   Resp:  (!) 22 18   Temp:    97.7 F (36.5 C)  TempSrc:    Oral  SpO2:   96%   Weight: 93 kg     Height: 5' 11 (1.803 m)      Eyes: *** ENMT: *** Neck: *** Respiratory: *** Cardiovascular: *** Abdomen: *** Musculoskeletal: *** Skin: *** Neurologic:*** Psychiatric: ***   Labs on Admission: I have personally reviewed following labs and imaging studies  CBC: Recent Labs  Lab 02/06/24 1945 02/06/24 1952  WBC 4.4  --   NEUTROABS 2.2  --   HGB 14.0 14.3  HCT 41.7 42.0  MCV 84.9  --   PLT 181  --    Basic Metabolic Panel: Recent Labs  Lab 02/06/24 1945 02/06/24 1952  NA 127* 129*  K 5.0 4.9  CL 94*  --   CO2 22  --   GLUCOSE 630*  --   BUN 14  --   CREATININE 1.19  --  CALCIUM  8.1*  --    GFR: Estimated Creatinine Clearance: 97.1 mL/min (by C-G formula based on SCr of 1.19 mg/dL). Liver Function Tests: Recent Labs  Lab 02/06/24 1945  AST 29  ALT 11  ALKPHOS 69  BILITOT 0.5  PROT 5.6*  ALBUMIN 2.9*   No results for input(s): LIPASE, AMYLASE in the last 168 hours. No results for input(s): AMMONIA in the last 168 hours. Coagulation Profile: No results for input(s): INR, PROTIME in the last 168 hours. Cardiac Enzymes: No results for input(s): CKTOTAL, CKMB, CKMBINDEX, TROPONINI in the last 168 hours. BNP (last 3 results) No results for input(s): PROBNP in the last 8760 hours. HbA1C: No results for input(s): HGBA1C in the last 72 hours. CBG: Recent Labs  Lab 02/06/24 1841  GLUCAP >600*   Lipid Profile: No results for input(s): CHOL, HDL, LDLCALC, TRIG, CHOLHDL, LDLDIRECT in the  last 72 hours. Thyroid  Function Tests: No results for input(s): TSH, T4TOTAL, FREET4, T3FREE, THYROIDAB in the last 72 hours. Anemia Panel: No results for input(s): VITAMINB12, FOLATE, FERRITIN, TIBC, IRON, RETICCTPCT in the last 72 hours. Urine analysis:    Component Value Date/Time   COLORURINE YELLOW 12/28/2023 2235   APPEARANCEUR CLEAR 12/28/2023 2235   LABSPEC 1.024 12/28/2023 2235   PHURINE 6.0 12/28/2023 2235   GLUCOSEU >=500 (A) 12/28/2023 2235   HGBUR NEGATIVE 12/28/2023 2235   BILIRUBINUR NEGATIVE 12/28/2023 2235   KETONESUR NEGATIVE 12/28/2023 2235   PROTEINUR NEGATIVE 12/28/2023 2235   NITRITE NEGATIVE 12/28/2023 2235   LEUKOCYTESUR NEGATIVE 12/28/2023 2235   Sepsis Labs: @LABRCNTIP (procalcitonin:4,lacticidven:4) )No results found for this or any previous visit (from the past 240 hours).   Radiological Exams on Admission: DG Chest 1 View Result Date: 02/06/2024 CLINICAL DATA:  cp EXAM: CHEST  1 VIEW COMPARISON:  October 21, 2023 FINDINGS: Low lung volumes. No focal airspace consolidation, pleural effusion, or pneumothorax. No cardiomegaly.No acute fracture or destructive lesion. IMPRESSION: Low lung volumes.  Otherwise, no acute cardiopulmonary abnormality. Electronically Signed   By: Rogelia Myers M.D.   On: 02/06/2024 19:25    EKG: Independently reviewed. ***  Assessment/Plan Active Problems:   Hyperosmolar hyperglycemic state (HHS) (HCC)   Hypertension associated with diabetes (HCC)   Pseudohyponatremia   Tobacco abuse    ***   DVT prophylaxis: *** Code Status: ***  Family Communication: ***  Disposition Plan: ***  Consults called: ***  Admission status: ***

## 2024-02-07 DIAGNOSIS — R079 Chest pain, unspecified: Secondary | ICD-10-CM | POA: Insufficient documentation

## 2024-02-07 LAB — BASIC METABOLIC PANEL WITH GFR
Anion gap: 7 (ref 5–15)
Anion gap: 8 (ref 5–15)
BUN: 11 mg/dL (ref 6–20)
BUN: 12 mg/dL (ref 6–20)
CO2: 22 mmol/L (ref 22–32)
CO2: 25 mmol/L (ref 22–32)
Calcium: 8.2 mg/dL — ABNORMAL LOW (ref 8.9–10.3)
Calcium: 8.4 mg/dL — ABNORMAL LOW (ref 8.9–10.3)
Chloride: 105 mmol/L (ref 98–111)
Chloride: 107 mmol/L (ref 98–111)
Creatinine, Ser: 0.98 mg/dL (ref 0.61–1.24)
Creatinine, Ser: 1.02 mg/dL (ref 0.61–1.24)
GFR, Estimated: >60 mL/min (ref >60)
GFR, Estimated: >60 mL/min (ref >60)
Glucose, Bld: 114 mg/dL — ABNORMAL HIGH (ref 70–99)
Glucose, Bld: 127 mg/dL — ABNORMAL HIGH (ref 70–99)
Potassium: 3.6 mmol/L (ref 3.5–5.1)
Potassium: 3.7 mmol/L (ref 3.5–5.1)
Sodium: 137 mmol/L (ref 135–145)
Sodium: 137 mmol/L (ref 135–145)

## 2024-02-07 LAB — CBC
HCT: 43 % (ref 39.0–52.0)
Hemoglobin: 14.5 g/dL (ref 13.0–17.0)
MCH: 28.1 pg (ref 26.0–34.0)
MCHC: 33.7 g/dL (ref 30.0–36.0)
MCV: 83.3 fL (ref 80.0–100.0)
Platelets: 165 K/uL (ref 150–400)
RBC: 5.16 MIL/uL (ref 4.22–5.81)
RDW: 12.4 % (ref 11.5–15.5)
WBC: 4.4 K/uL (ref 4.0–10.5)
nRBC: 0 % (ref 0.0–0.2)

## 2024-02-07 LAB — CBG MONITORING, ED
Glucose-Capillary: 124 mg/dL — ABNORMAL HIGH (ref 70–99)
Glucose-Capillary: 298 mg/dL — ABNORMAL HIGH (ref 70–99)
Glucose-Capillary: 361 mg/dL — ABNORMAL HIGH (ref 70–99)
Glucose-Capillary: 277 mg/dL — ABNORMAL HIGH (ref 70–99)

## 2024-02-07 LAB — OSMOLALITY: Osmolality: 287 mosm/kg (ref 275–295)

## 2024-02-07 MED ORDER — LACTATED RINGERS IV SOLN: INTRAVENOUS | Status: DC

## 2024-02-07 MED ORDER — INSULIN ASPART 100 UNIT/ML IJ SOLN
0.0000 [IU] | Freq: Three times a day (TID) | INTRAMUSCULAR | Status: DC
  Administered 2024-02-07: 8 [IU] via SUBCUTANEOUS

## 2024-02-07 MED ORDER — INSULIN GLARGINE-YFGN 100 UNIT/ML ~~LOC~~ SOLN
30.0000 [IU] | Freq: Every day | SUBCUTANEOUS | Status: DC
  Administered 2024-02-07: 30 [IU] via SUBCUTANEOUS
  Filled 2024-02-07 (×2): qty 0.3

## 2024-02-07 NOTE — ED Notes (Signed)
 Patient is leaving AMA, hospitalist aware, patient declined any discharge vitals, patient belonging returned to patient.

## 2024-02-07 NOTE — Discharge Summary (Signed)
 I was informed by Mardeen, RN, that patient, who is oriented x 4, wanted to leave the hospital AGAINST MEDICAL ADVICE.  Patient was advised to wait a little bit for attending physician to round on him.  However, I was informed by Mardeen, RN, that he's already dressed and stated I dont want to talk to the doctor .  Patient was not seen.  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

## 2024-02-15 ENCOUNTER — Emergency Department (HOSPITAL_COMMUNITY)

## 2024-02-15 ENCOUNTER — Encounter (HOSPITAL_COMMUNITY): Payer: Self-pay

## 2024-02-15 ENCOUNTER — Emergency Department (HOSPITAL_COMMUNITY)
Admission: EM | Admit: 2024-02-15 | Discharge: 2024-02-15 | Disposition: A | Discharge status: Home / Self Care | Attending: Emergency Medicine | Admitting: Emergency Medicine

## 2024-02-15 ENCOUNTER — Other Ambulatory Visit: Payer: Self-pay

## 2024-02-15 DIAGNOSIS — I10 Essential (primary) hypertension: Secondary | ICD-10-CM | POA: Insufficient documentation

## 2024-02-15 DIAGNOSIS — Z794 Long term (current) use of insulin: Secondary | ICD-10-CM | POA: Diagnosis not present

## 2024-02-15 DIAGNOSIS — W19XXXA Unspecified fall, initial encounter: Secondary | ICD-10-CM | POA: Diagnosis not present

## 2024-02-15 DIAGNOSIS — S0121XA Laceration without foreign body of nose, initial encounter: Secondary | ICD-10-CM | POA: Insufficient documentation

## 2024-02-15 DIAGNOSIS — F17210 Nicotine dependence, cigarettes, uncomplicated: Secondary | ICD-10-CM | POA: Diagnosis not present

## 2024-02-15 DIAGNOSIS — S0031XA Abrasion of nose, initial encounter: Secondary | ICD-10-CM | POA: Diagnosis not present

## 2024-02-15 DIAGNOSIS — R531 Weakness: Secondary | ICD-10-CM | POA: Diagnosis not present

## 2024-02-15 DIAGNOSIS — E860 Dehydration: Secondary | ICD-10-CM | POA: Diagnosis not present

## 2024-02-15 DIAGNOSIS — Z79899 Other long term (current) drug therapy: Secondary | ICD-10-CM | POA: Diagnosis not present

## 2024-02-15 DIAGNOSIS — E119 Type 2 diabetes mellitus without complications: Secondary | ICD-10-CM | POA: Insufficient documentation

## 2024-02-15 DIAGNOSIS — M79642 Pain in left hand: Secondary | ICD-10-CM | POA: Diagnosis not present

## 2024-02-15 DIAGNOSIS — K029 Dental caries, unspecified: Secondary | ICD-10-CM | POA: Diagnosis not present

## 2024-02-15 DIAGNOSIS — S0992XA Unspecified injury of nose, initial encounter: Secondary | ICD-10-CM | POA: Diagnosis present

## 2024-02-15 DIAGNOSIS — M79641 Pain in right hand: Secondary | ICD-10-CM | POA: Diagnosis not present

## 2024-02-15 DIAGNOSIS — S022XXA Fracture of nasal bones, initial encounter for closed fracture: Secondary | ICD-10-CM | POA: Diagnosis not present

## 2024-02-15 LAB — CBC WITH DIFFERENTIAL/PLATELET
Abs Immature Granulocytes: 0.02 K/uL (ref 0.00–0.07)
Basophils Absolute: 0 K/uL (ref 0.0–0.1)
Basophils Relative: 1 %
Eosinophils Absolute: 0.1 K/uL (ref 0.0–0.5)
Eosinophils Relative: 1 %
HCT: 51.1 % (ref 39.0–52.0)
Hemoglobin: 17.9 g/dL — ABNORMAL HIGH (ref 13.0–17.0)
Immature Granulocytes: 0 %
Lymphocytes Relative: 21 %
Lymphs Abs: 1.4 K/uL (ref 0.7–4.0)
MCH: 28.7 pg (ref 26.0–34.0)
MCHC: 35 g/dL (ref 30.0–36.0)
MCV: 82 fL (ref 80.0–100.0)
Monocytes Absolute: 0.5 K/uL (ref 0.1–1.0)
Monocytes Relative: 8 %
Neutro Abs: 4.7 K/uL (ref 1.7–7.7)
Neutrophils Relative %: 69 %
Platelets: 284 K/uL (ref 150–400)
RBC: 6.23 MIL/uL — ABNORMAL HIGH (ref 4.22–5.81)
RDW: 12.4 % (ref 11.5–15.5)
WBC: 6.7 K/uL (ref 4.0–10.5)
nRBC: 0 % (ref 0.0–0.2)

## 2024-02-15 LAB — BASIC METABOLIC PANEL WITH GFR
Anion gap: 14 (ref 5–15)
BUN: 22 mg/dL — ABNORMAL HIGH (ref 6–20)
CO2: 23 mmol/L (ref 22–32)
Calcium: 9.7 mg/dL (ref 8.9–10.3)
Chloride: 95 mmol/L — ABNORMAL LOW (ref 98–111)
Creatinine, Ser: 1.65 mg/dL — ABNORMAL HIGH (ref 0.61–1.24)
GFR, Estimated: 54 mL/min — ABNORMAL LOW (ref >60)
Glucose, Bld: 296 mg/dL — ABNORMAL HIGH (ref 70–99)
Potassium: 3.6 mmol/L (ref 3.5–5.1)
Sodium: 132 mmol/L — ABNORMAL LOW (ref 135–145)

## 2024-02-15 LAB — ETHANOL: Alcohol, Ethyl (B): <15 mg/dL (ref <15)

## 2024-02-15 MED ORDER — SODIUM CHLORIDE 0.9 % IV BOLUS
1000.0000 mL | Freq: Once | INTRAVENOUS | Status: AC
  Administered 2024-02-15: 1000 mL via INTRAVENOUS

## 2024-02-15 NOTE — ED Notes (Signed)
 Patient transported to CT

## 2024-02-15 NOTE — Discharge Instructions (Signed)
 Your x-rays didn't show any fracture. Please make sure you are staying hydrated. Your lab tests did show some dehydration. Please return for any worsening symptoms.
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
## 2024-02-15 NOTE — ED Notes (Signed)
 IV attempt x 2 unsuccessful.
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
## 2024-02-26 ENCOUNTER — Observation Stay (HOSPITAL_COMMUNITY)
Admission: EM | Admit: 2024-02-26 | Discharge: 2024-02-28 | Disposition: A | Discharge status: Home / Self Care | Attending: Emergency Medicine | Admitting: Emergency Medicine

## 2024-02-26 ENCOUNTER — Encounter (HOSPITAL_COMMUNITY): Payer: Self-pay | Admitting: Emergency Medicine

## 2024-02-26 ENCOUNTER — Emergency Department (HOSPITAL_COMMUNITY)
Admission: EM | Admit: 2024-02-26 | Discharge: 2024-02-26 | Discharge status: Against Medical Advice | Source: Home / Self Care | Attending: Emergency Medicine | Admitting: Emergency Medicine

## 2024-02-26 ENCOUNTER — Other Ambulatory Visit: Payer: Self-pay

## 2024-02-26 ENCOUNTER — Encounter (HOSPITAL_COMMUNITY): Payer: Self-pay

## 2024-02-26 DIAGNOSIS — Z5329 Procedure and treatment not carried out because of patient's decision for other reasons: Secondary | ICD-10-CM | POA: Insufficient documentation

## 2024-02-26 DIAGNOSIS — R739 Hyperglycemia, unspecified: Secondary | ICD-10-CM

## 2024-02-26 DIAGNOSIS — E875 Hyperkalemia: Secondary | ICD-10-CM | POA: Insufficient documentation

## 2024-02-26 DIAGNOSIS — I10 Essential (primary) hypertension: Secondary | ICD-10-CM | POA: Insufficient documentation

## 2024-02-26 DIAGNOSIS — N179 Acute kidney failure, unspecified: Secondary | ICD-10-CM | POA: Insufficient documentation

## 2024-02-26 DIAGNOSIS — E1110 Type 2 diabetes mellitus with ketoacidosis without coma: Secondary | ICD-10-CM | POA: Insufficient documentation

## 2024-02-26 DIAGNOSIS — E108 Type 1 diabetes mellitus with unspecified complications: Secondary | ICD-10-CM | POA: Diagnosis not present

## 2024-02-26 DIAGNOSIS — Z79899 Other long term (current) drug therapy: Secondary | ICD-10-CM | POA: Insufficient documentation

## 2024-02-26 DIAGNOSIS — E871 Hypo-osmolality and hyponatremia: Secondary | ICD-10-CM | POA: Diagnosis not present

## 2024-02-26 DIAGNOSIS — E1100 Type 2 diabetes mellitus with hyperosmolarity without nonketotic hyperglycemic-hyperosmolar coma (NKHHC): Principal | ICD-10-CM

## 2024-02-26 DIAGNOSIS — E1165 Type 2 diabetes mellitus with hyperglycemia: Secondary | ICD-10-CM | POA: Diagnosis not present

## 2024-02-26 DIAGNOSIS — Z794 Long term (current) use of insulin: Secondary | ICD-10-CM | POA: Insufficient documentation

## 2024-02-26 DIAGNOSIS — E11649 Type 2 diabetes mellitus with hypoglycemia without coma: Secondary | ICD-10-CM | POA: Diagnosis not present

## 2024-02-26 DIAGNOSIS — F17210 Nicotine dependence, cigarettes, uncomplicated: Secondary | ICD-10-CM | POA: Insufficient documentation

## 2024-02-26 LAB — BASIC METABOLIC PANEL WITH GFR
Anion gap: 13 (ref 5–15)
Anion gap: 13 (ref 5–15)
Anion gap: 9 (ref 5–15)
BUN: 14 mg/dL (ref 6–20)
BUN: 19 mg/dL (ref 6–20)
BUN: 15 mg/dL (ref 6–20)
CO2: 19 mmol/L — ABNORMAL LOW (ref 22–32)
CO2: 22 mmol/L (ref 22–32)
CO2: 25 mmol/L (ref 22–32)
Calcium: 8.3 mg/dL — ABNORMAL LOW (ref 8.9–10.3)
Calcium: 9.1 mg/dL (ref 8.9–10.3)
Calcium: 8.9 mg/dL (ref 8.9–10.3)
Chloride: 93 mmol/L — ABNORMAL LOW (ref 98–111)
Chloride: 93 mmol/L — ABNORMAL LOW (ref 98–111)
Chloride: 100 mmol/L (ref 98–111)
Creatinine, Ser: 1.67 mg/dL — ABNORMAL HIGH (ref 0.61–1.24)
Creatinine, Ser: 1.34 mg/dL — ABNORMAL HIGH (ref 0.61–1.24)
Creatinine, Ser: 0.98 mg/dL (ref 0.61–1.24)
GFR, Estimated: 53 mL/min — ABNORMAL LOW (ref >60)
GFR, Estimated: >60 mL/min (ref >60)
GFR, Estimated: >60 mL/min (ref >60)
Glucose, Bld: 1010 mg/dL (ref 70–99)
Glucose, Bld: 563 mg/dL (ref 70–99)
Glucose, Bld: 157 mg/dL — ABNORMAL HIGH (ref 70–99)
Potassium: 5.8 mmol/L — ABNORMAL HIGH (ref 3.5–5.1)
Potassium: 4.5 mmol/L (ref 3.5–5.1)
Potassium: 3.3 mmol/L — ABNORMAL LOW (ref 3.5–5.1)
Sodium: 125 mmol/L — ABNORMAL LOW (ref 135–145)
Sodium: 128 mmol/L — ABNORMAL LOW (ref 135–145)
Sodium: 134 mmol/L — ABNORMAL LOW (ref 135–145)

## 2024-02-26 LAB — GLUCOSE, CAPILLARY
Glucose-Capillary: 463 mg/dL — ABNORMAL HIGH (ref 70–99)
Glucose-Capillary: 331 mg/dL — ABNORMAL HIGH (ref 70–99)
Glucose-Capillary: 239 mg/dL — ABNORMAL HIGH (ref 70–99)
Glucose-Capillary: 184 mg/dL — ABNORMAL HIGH (ref 70–99)
Glucose-Capillary: 191 mg/dL — ABNORMAL HIGH (ref 70–99)
Glucose-Capillary: 176 mg/dL — ABNORMAL HIGH (ref 70–99)
Glucose-Capillary: 159 mg/dL — ABNORMAL HIGH (ref 70–99)
Glucose-Capillary: 154 mg/dL — ABNORMAL HIGH (ref 70–99)
Glucose-Capillary: 174 mg/dL — ABNORMAL HIGH (ref 70–99)

## 2024-02-26 LAB — URINALYSIS, ROUTINE W REFLEX MICROSCOPIC
Bacteria, UA: NONE SEEN
Bilirubin Urine: NEGATIVE
Glucose, UA: >=500 mg/dL — AB
Hgb urine dipstick: NEGATIVE
Ketones, ur: NEGATIVE mg/dL
Leukocytes,Ua: NEGATIVE
Nitrite: NEGATIVE
Protein, ur: NEGATIVE mg/dL
Specific Gravity, Urine: 1.022 (ref 1.005–1.030)
pH: 6 (ref 5.0–8.0)

## 2024-02-26 LAB — CBC WITH DIFFERENTIAL/PLATELET
Abs Immature Granulocytes: 0.01 K/uL (ref 0.00–0.07)
Basophils Absolute: 0 K/uL (ref 0.0–0.1)
Basophils Relative: 1 %
Eosinophils Absolute: 0.1 K/uL (ref 0.0–0.5)
Eosinophils Relative: 1 %
HCT: 43.6 % (ref 39.0–52.0)
Hemoglobin: 14.7 g/dL (ref 13.0–17.0)
Immature Granulocytes: 0 %
Lymphocytes Relative: 23 %
Lymphs Abs: 1.3 K/uL (ref 0.7–4.0)
MCH: 28.7 pg (ref 26.0–34.0)
MCHC: 33.7 g/dL (ref 30.0–36.0)
MCV: 85.2 fL (ref 80.0–100.0)
Monocytes Absolute: 0.3 K/uL (ref 0.1–1.0)
Monocytes Relative: 6 %
Neutro Abs: 3.8 K/uL (ref 1.7–7.7)
Neutrophils Relative %: 69 %
Platelets: 238 K/uL (ref 150–400)
RBC: 5.12 MIL/uL (ref 4.22–5.81)
RDW: 12 % (ref 11.5–15.5)
WBC: 5.5 K/uL (ref 4.0–10.5)
nRBC: 0 % (ref 0.0–0.2)

## 2024-02-26 LAB — I-STAT VENOUS BLOOD GAS, ED
Acid-base deficit: 3 mmol/L — ABNORMAL HIGH (ref 0.0–2.0)
Bicarbonate: 21.6 mmol/L (ref 20.0–28.0)
Calcium, Ion: 0.92 mmol/L — ABNORMAL LOW (ref 1.15–1.40)
HCT: 45 % (ref 39.0–52.0)
Hemoglobin: 15.3 g/dL (ref 13.0–17.0)
O2 Saturation: 98 %
Potassium: 5.4 mmol/L — ABNORMAL HIGH (ref 3.5–5.1)
Sodium: 120 mmol/L — ABNORMAL LOW (ref 135–145)
TCO2: 23 mmol/L (ref 22–32)
pCO2, Ven: 34.9 mmHg — ABNORMAL LOW (ref 44–60)
pH, Ven: 7.399 (ref 7.25–7.43)
pO2, Ven: 99 mmHg — ABNORMAL HIGH (ref 32–45)

## 2024-02-26 LAB — MRSA NEXT GEN BY PCR, NASAL: MRSA by PCR Next Gen: NOT DETECTED

## 2024-02-26 LAB — CBG MONITORING, ED
Glucose-Capillary: >600 mg/dL (ref 70–99)
Glucose-Capillary: >600 mg/dL (ref 70–99)
Glucose-Capillary: >600 mg/dL (ref 70–99)

## 2024-02-26 LAB — BETA-HYDROXYBUTYRIC ACID: Beta-Hydroxybutyric Acid: 0.17 mmol/L (ref 0.05–0.27)

## 2024-02-26 MED ORDER — INSULIN REGULAR(HUMAN) IN NACL 100-0.9 UT/100ML-% IV SOLN
INTRAVENOUS | Status: DC
  Administered 2024-02-26: 10.5 [IU]/h via INTRAVENOUS
  Filled 2024-02-26: qty 100

## 2024-02-26 MED ORDER — DEXTROSE 50 % IV SOLN: 0.0000 mL | INTRAVENOUS | Status: DC | PRN

## 2024-02-26 MED ORDER — ALBUTEROL SULFATE (2.5 MG/3ML) 0.083% IN NEBU: 2.5000 mg | INHALATION_SOLUTION | RESPIRATORY_TRACT | Status: DC | PRN

## 2024-02-26 MED ORDER — LACTATED RINGERS IV SOLN: INTRAVENOUS | Status: DC

## 2024-02-26 MED ORDER — ONDANSETRON HCL 4 MG PO TABS: 4.0000 mg | ORAL_TABLET | Freq: Four times a day (QID) | ORAL | Status: DC | PRN

## 2024-02-26 MED ORDER — AMLODIPINE BESYLATE 5 MG PO TABS
5.0000 mg | ORAL_TABLET | Freq: Every day | ORAL | Status: DC
  Administered 2024-02-26 – 2024-02-28 (×3): 5 mg via ORAL
  Filled 2024-02-26 (×3): qty 1

## 2024-02-26 MED ORDER — DEXTROSE IN LACTATED RINGERS 5 % IV SOLN: INTRAVENOUS | Status: DC

## 2024-02-26 MED ORDER — ACETAMINOPHEN 650 MG RE SUPP: 650.0000 mg | Freq: Four times a day (QID) | RECTAL | Status: DC | PRN

## 2024-02-26 MED ORDER — ACETAMINOPHEN 325 MG PO TABS
650.0000 mg | ORAL_TABLET | Freq: Four times a day (QID) | ORAL | Status: DC | PRN
Start: 2024-02-26 — End: 2024-02-28

## 2024-02-26 MED ORDER — CHLORHEXIDINE GLUCONATE CLOTH 2 % EX PADS
6.0000 | MEDICATED_PAD | Freq: Every day | CUTANEOUS | Status: DC
  Administered 2024-02-26 – 2024-02-27 (×2): 6 via TOPICAL

## 2024-02-26 MED ORDER — HEPARIN SODIUM (PORCINE) 5000 UNIT/ML IJ SOLN
5000.0000 [IU] | Freq: Three times a day (TID) | INTRAMUSCULAR | Status: DC
  Administered 2024-02-26 – 2024-02-27 (×2): 5000 [IU] via SUBCUTANEOUS
  Filled 2024-02-26 (×5): qty 1

## 2024-02-26 MED ORDER — LACTATED RINGERS IV BOLUS
2000.0000 mL | Freq: Once | INTRAVENOUS | Status: AC
  Administered 2024-02-26: 2000 mL via INTRAVENOUS

## 2024-02-26 MED ORDER — LACTATED RINGERS IV BOLUS
1000.0000 mL | Freq: Once | INTRAVENOUS | Status: AC
  Administered 2024-02-26: 1000 mL via INTRAVENOUS

## 2024-02-26 MED ORDER — ONDANSETRON HCL 4 MG/2ML IJ SOLN: 4.0000 mg | Freq: Four times a day (QID) | INTRAMUSCULAR | Status: DC | PRN

## 2024-02-26 NOTE — ED Notes (Addendum)
 This RN found patient eating vienna sausages from can.  Pt educated patient about not eating as his sugars are high and patient should be following directions from MD.  MD Nada and MD Pamella notified and aware.

## 2024-02-26 NOTE — Plan of Care (Signed)
   Problem: Activity: Goal: Risk for activity intolerance will decrease Outcome: Progressing   Problem: Coping: Goal: Level of anxiety will decrease Outcome: Progressing   Problem: Safety: Goal: Ability to remain free from injury will improve Outcome: Progressing

## 2024-02-26 NOTE — ED Triage Notes (Signed)
 Pt BIB [facility] from [employer] due to hyperglycemia.  Pt has not been taking insulin  the last couple days.  CBG with EMS 522. VS BP 138/82, HR 100 SpO2 96%

## 2024-02-26 NOTE — ED Notes (Signed)
 CCMD called by this RN

## 2024-02-26 NOTE — H&P (Signed)
 History and Physical  [name] [name] FMW:[id number] DOB: Dec 21, 1985 DOA: 02/26/2024  PCP: Pcp, No   Chief Complaint: Weakness, frequent urination  HPI: [name] [name] is a 38 y.o. male with medical history significant for hypertension, poorly controlled insulin -dependent type 2 diabetes who recently ran out of his medication about 5 days ago and is being admitted to the hospital with hyperosmolar hyperglycemic state.  Patient states that for the last 3 or 4 days, he has been feeling dehydrated, weak generally, with increased thirst and urination.  He denies any UTI symptoms, nausea, vomiting, cough, fevers, abdominal pain or any other complaints.  He takes insulin  which was prescribed during his last hospitalization but states that he ran out and does not have a PCP.  He was last hospitalized at [facility] on the evening of 7/11 and looks like he left AMA the following morning.  Review of Systems: Please see HPI for pertinent positives and negatives. A complete 10 system review of systems are otherwise negative.  Past Medical History:  Diagnosis Date   Diabetes mellitus without complication (HCC)    DKA (diabetic ketoacidosis) (HCC) 05/08/2022   HTN (hypertension) 05/08/2022   Hyperlipidemia    Hyperosmolar hyperglycemic state (HHS) (HCC) 05/08/2022   Hypertension    Hypoglycemia 07/02/2019   Pseudohyponatremia 05/08/2022   Past Surgical History:  Procedure Laterality Date   HAND SURGERY     Social History:  reports that he has been smoking cigarettes. He has never used smokeless tobacco. He reports current drug use. Drugs: Amphetamines and Marijuana. He reports that he does not drink alcohol.  No Known Allergies  Family History  Family history unknown: Yes     Prior to Admission medications   Medication Sig Start Date End Date Taking? Authorizing Provider  amLODipine  (NORVASC ) 5 MG tablet Take 1 tablet (5 mg total) by mouth daily. 01/03/24   Vicky Charleston, PA-C  Insulin  Aspart  FlexPen (NOVOLOG ) 100 UNIT/ML Inject 8 Units into the skin 3 (three) times daily with meals. 12/20/23   [provider]  insulin  lispro (HUMALOG ) 100 UNIT/ML KwikPen Inject 8 Units into the skin 3 (three) times daily with meals. Only take if eating a meal AND Blood Glucose (BG) is 80 or higher. Patient not taking: Reported on 02/07/2024 12/14/23   Elpidio Reyes DEL, MD  Insulin  Pen Needle (PEN NEEDLES) 31G X 5 MM MISC Use  three times daily Patient not taking: Reported on 02/07/2024 09/17/23   Neldon Hamp RAMAN, PA  Lancet Device MISC Use 3 (three) times daily. Patient not taking: Reported on 02/07/2024 09/17/23   Neldon Hamp RAMAN, PA  Lancets MISC Use to check blood sugar three times daily Patient not taking: Reported on 02/07/2024 08/26/23   Jonel Lonni SQUIBB, MD  SEMGLEE , YFGN, 100 UNIT/ML Pen Inject 30 Units into the skin at bedtime. 01/16/24   [provider]    Physical Exam: BP (!) 155/88   Pulse 80   Temp 97.7 F (36.5 C) (Oral)   Resp 16   SpO2 99%  General:  Alert, oriented, calm, in no acute distress  Eyes: EOMI, clear conjuctivae, white sclerea Neck: supple, no masses, trachea mildline  Cardiovascular: RRR, no murmurs or rubs, no peripheral edema  Respiratory: clear to auscultation bilaterally, no wheezes, no crackles  Abdomen: soft, nontender, nondistended, normal bowel tones heard  Skin: dry, no rashes  Musculoskeletal: no joint effusions, normal range of motion  Psychiatric: appropriate affect, normal speech  Neurologic: extraocular muscles intact, clear speech,  moving all extremities with intact sensorium         Labs on Admission:  Basic Metabolic Panel: Recent Labs  Lab 02/26/24 0950 02/26/24 1009  NA 125* 120*  K 5.8* 5.4*  CL 93*  --   CO2 19*  --   GLUCOSE 1,010*  --   BUN 14  --   CREATININE 1.67*  --   CALCIUM  8.3*  --    Liver Function Tests: No results for input(s): AST, ALT, ALKPHOS, BILITOT, PROT, ALBUMIN in the last  168 hours. No results for input(s): LIPASE, AMYLASE in the last 168 hours. No results for input(s): AMMONIA in the last 168 hours. CBC: Recent Labs  Lab 02/26/24 0950 02/26/24 1009  WBC 5.5  --   NEUTROABS 3.8  --   HGB 14.7 15.3  HCT 43.6 45.0  MCV 85.2  --   PLT 238  --    Cardiac Enzymes: No results for input(s): CKTOTAL, CKMB, CKMBINDEX, TROPONINI in the last 168 hours. BNP (last 3 results) No results for input(s): BNP in the last 8760 hours.  ProBNP (last 3 results) No results for input(s): PROBNP in the last 8760 hours.  CBG: Recent Labs  Lab 02/26/24 0957 02/26/24 1132 02/26/24 1425  GLUCAP >600* >600* >600*    Radiological Exams on Admission: No results found. Assessment/Plan [name] [name] is a 38 y.o. male with medical history significant for hypertension, poorly controlled insulin -dependent type 2 diabetes who recently ran out of his medication about 5 days ago and is being admitted to the hospital with hyperosmolar hyperglycemic state.   HHS-with severely elevated blood sugar, metabolic acidosis, no anion gap, and normal beta hydroxybutyric acid.  Blood sugars elevated out-of-control due to noncompliance with medications, patient states he ran out of medications about 5 days ago.  No infectious symptoms. -Observation admission -IV insulin  drip -IV fluids -Monitor every 8 hour BMP -Clear liquid diet, diet can be advanced as tolerated once blood sugars are improving significantly  Pseudohyponatremia-likely due to combination of dehydration, and significant hyperglycemia.  Anticipate that sodium levels will improve as his blood sugars improved.  Hyperkalemia-will monitor closely with every 8 hour BMP, likely related to his significant hyperglycemia  Acute kidney injury-in the setting of baseline normal renal function, likely related to significant dehydration and poorly controlled blood sugars.  Hypertension-continue home amlodipine  5 mg  p.o. daily  DVT prophylaxis: Subcutaneous heparin     Code Status: Full Code  Consults called: None  Admission status: Observation  Time spent: 49 minutes  [name] Goins CHRISTELLA Gail MD [facility] Pager [phone]  If 7PM-7AM, please contact night-coverage [email or web address] Password [facility]  02/26/2024, 2:39 PM

## 2024-02-26 NOTE — ED Provider Notes (Signed)
 [place] EMERGENCY DEPARTMENT AT [facility] Provider Note   CSN: [id number] Arrival date & time: 02/26/24  1118     Patient presents with: Hyperglycemia   [name] [name] is a 38 y.o. male.    Hyperglycemia    Patient has a history of hypertension hyperlipidemia diabetes.  Patient presented to the emergency room for evaluation of high blood sugar.  Patient states he is not been able to take his medications for the last few days.  He ran out.  Patient denies any vomiting.  Denies diarrhea.  No abdominal pain.  No focal numbness or weakness.  Patient states he went to [facility] emergency room today.  He states they did not put the IV in properly for him he ended up taking the IV out and leaving AMA.  Prior to Admission medications   Medication Sig Start Date End Date Taking? Authorizing Provider  amLODipine  (NORVASC ) 5 MG tablet Take 1 tablet (5 mg total) by mouth daily. 01/03/24   Vicky Charleston, PA-C  Insulin  Aspart FlexPen (NOVOLOG ) 100 UNIT/ML Inject 8 Units into the skin 3 (three) times daily with meals. 12/20/23   [provider]  insulin  lispro (HUMALOG ) 100 UNIT/ML KwikPen Inject 8 Units into the skin 3 (three) times daily with meals. Only take if eating a meal AND Blood Glucose (BG) is 80 or higher. Patient not taking: Reported on 02/07/2024 12/14/23   Elpidio Reyes DEL, MD  Insulin  Pen Needle (PEN NEEDLES) 31G X 5 MM MISC Use  three times daily Patient not taking: Reported on 02/07/2024 09/17/23   Neldon Hamp RAMAN, PA  Lancet Device MISC Use 3 (three) times daily. Patient not taking: Reported on 02/07/2024 09/17/23   Neldon Hamp RAMAN, PA  Lancets MISC Use to check blood sugar three times daily Patient not taking: Reported on 02/07/2024 08/26/23   Jonel Lonni SQUIBB, MD  SEMGLEE , YFGN, 100 UNIT/ML Pen Inject 30 Units into the skin at bedtime. 01/16/24   [provider]    Allergies: Patient has no known allergies.    Review of Systems  Updated  Vital Signs BP (!) 155/88   Pulse 80   Temp 97.7 F (36.5 C) (Oral)   Resp 16   SpO2 99%   Physical Exam Vitals and nursing note reviewed.  Constitutional:      Appearance: He is well-developed. He is not diaphoretic.  HENT:     Head: Normocephalic and atraumatic.     Right Ear: External ear normal.     Left Ear: External ear normal.  Eyes:     General: No scleral icterus.       Right eye: No discharge.        Left eye: No discharge.     Conjunctiva/sclera: Conjunctivae normal.  Neck:     Trachea: No tracheal deviation.  Cardiovascular:     Rate and Rhythm: Normal rate and regular rhythm.  Pulmonary:     Effort: Pulmonary effort is normal. No respiratory distress.     Breath sounds: Normal breath sounds. No stridor. No wheezing or rales.  Abdominal:     General: Bowel sounds are normal. There is no distension.     Palpations: Abdomen is soft.     Tenderness: There is no abdominal tenderness. There is no guarding or rebound.  Musculoskeletal:        General: No tenderness or deformity.     Cervical back: Neck supple.     Comments: Bandage on left arm where  his IV was placed  Skin:    General: Skin is warm and dry.     Findings: No rash.  Neurological:     General: No focal deficit present.     Mental Status: He is alert.     Cranial Nerves: No cranial nerve deficit, dysarthria or facial asymmetry.     Sensory: No sensory deficit.     Motor: No abnormal muscle tone or seizure activity.     Coordination: Coordination normal.  Psychiatric:        Mood and Affect: Mood normal.     (all labs ordered are listed, but only abnormal results are displayed) Labs Reviewed  CBG MONITORING, ED - Abnormal; Notable for the following components:      Result Value   Glucose-Capillary >600 (*)    All other components within normal limits  BASIC METABOLIC PANEL WITH GFR    EKG: None  Radiology: No results found.   .Critical Care  Performed by: Randol Simmonds,  MD Authorized by: Randol Simmonds, MD   Critical care provider statement:    Critical care time (minutes):  30   Critical care was time spent personally by me on the following activities:  Development of treatment plan with patient or surrogate, discussions with consultants, evaluation of patient's response to treatment, examination of patient, ordering and review of laboratory studies, ordering and review of radiographic studies, ordering and performing treatments and interventions, pulse oximetry, re-evaluation of patient's condition and review of old charts    Medications Ordered in the ED  insulin  regular, human (MYXREDLIN ) 100 units/ 100 mL infusion (10.5 Units/hr Intravenous New Bag/Given 02/26/24 1336)  lactated ringers  infusion ( Intravenous New Bag/Given 02/26/24 1331)  dextrose  50 % solution 0-50 mL (has no administration in time range)  lactated ringers  bolus 1,000 mL (0 mLs Intravenous Stopped 02/26/24 1401)                                    Medical Decision Making Problems Addressed: Hyperglycemia: acute illness or injury that poses a threat to life or bodily functions  Risk Prescription drug management.  Patient had laboratory test performed at [facility] within the last 2 hours.  Patient had a normal CBC.  Patient's metabolic panel showed decreased sodium, increased glucose at 1000.  Increased creatinine at 1.67.  Anion gap was not elevated at 13.  Bicarb was slightly decreased at 19.  Beta hydroxybutyric acid is normal at 0.17.  CBC normal.  Urinalysis not suggestive of infection.  I-STAT venous blood gas showed a pH of 7.399   Patient's labs did show severe hyperglycemia.  No clear signs of DKA.  I have ordered IV fluids and IV insulin .  I will consult the medical service for admission     Final diagnoses:  Hyperglycemia    ED Discharge Orders     None          Randol Simmonds, MD 02/26/24 1419

## 2024-02-26 NOTE — ED Triage Notes (Signed)
 Pt reports with hyperglycemia x 2 days. Pt recently at [facility] and left due to an Iv having issues.

## 2024-02-26 NOTE — ED Notes (Signed)
 Eloped from [facility] ED. Recent AM labs in system. Pt alert, NAD, calm, interactive, steady gait, cooperative, polite.

## 2024-02-26 NOTE — ED Notes (Signed)
 Admitting MD at [facility].

## 2024-02-26 NOTE — ED Notes (Signed)
 Pt has signed AMA.  MD Nada and MD Pamella notified and notified of risks and even death.  This RN signed as witness.

## 2024-02-26 NOTE — ED Notes (Signed)
 Pt alert, NAD, calm, interactive, resps e/u, skin W&D, speaking in clear complete sentences. VSS. BP elevated. NSR on monitor. Endorses HA, and some mild dizziness. Steady rapid gait. Feel dehydrated. Denies other pain, sob, NVD, or fever.

## 2024-02-26 NOTE — ED Provider Notes (Signed)
 [place] EMERGENCY DEPARTMENT AT [facility] Provider Note   CSN: [id number] Arrival date & time: 02/26/24  9068     Patient presents with: No chief complaint on file.   [name] [name] is a 38 y.o. male past medical history significant for hypertension, hyperlipidemia, type 2 diabetes with a history of DKA and HHS who presents to the emergency department for hyperglycemia.  Patient states that over the past 3 days he has had multiple episodes of hypoglycemia with glucose ranges in the 300-400s.  Patient states that he has had difficulty with receiving insulin  at home and therefore has not been giving himself insulin  shots.  Patient endorses associated lethargy as well as 2-3 episodes of diarrhea over the past 24 hours.  Patient denies associated fever, cough, shortness of breath, chest pain, emesis.  Patient states he is able to tolerate p.o. intake at this time   HPI     Prior to Admission medications   Medication Sig Start Date End Date Taking? Authorizing Provider  amLODipine  (NORVASC ) 5 MG tablet Take 1 tablet (5 mg total) by mouth daily. 01/03/24   Vicky Charleston, PA-C  Insulin  Aspart FlexPen (NOVOLOG ) 100 UNIT/ML Inject 8 Units into the skin 3 (three) times daily with meals. 12/20/23   [provider]  insulin  lispro (HUMALOG ) 100 UNIT/ML KwikPen Inject 8 Units into the skin 3 (three) times daily with meals. Only take if eating a meal AND Blood Glucose (BG) is 80 or higher. Patient not taking: Reported on 02/07/2024 12/14/23   Elpidio Reyes DEL, MD  Insulin  Pen Needle (PEN NEEDLES) 31G X 5 MM MISC Use  three times daily Patient not taking: Reported on 02/07/2024 09/17/23   Neldon Hamp RAMAN, PA  Lancet Device MISC Use 3 (three) times daily. Patient not taking: Reported on 02/07/2024 09/17/23   Neldon Hamp RAMAN, PA  Lancets MISC Use to check blood sugar three times daily Patient not taking: Reported on 02/07/2024 08/26/23   Jonel Lonni SQUIBB, MD  SEMGLEE , YFGN, 100  UNIT/ML Pen Inject 30 Units into the skin at bedtime. 01/16/24   [provider]    Allergies: Patient has no known allergies.    Review of Systems  Updated Vital Signs BP 131/85   Pulse 93   Temp 98 F (36.7 C) (Oral)   Resp 14   Ht 5' 11 (1.803 m)   Wt 104.3 kg   SpO2 100%   BMI 32.08 kg/m   Physical Exam Vitals and nursing note reviewed.  Constitutional:      General: He is not in acute distress. HENT:     Head: Normocephalic and atraumatic.  Cardiovascular:     Rate and Rhythm: Normal rate and regular rhythm.     Heart sounds: Normal heart sounds. No murmur heard. Pulmonary:     Effort: Pulmonary effort is normal. No tachypnea.     Breath sounds: Normal breath sounds.  Abdominal:     General: There is no distension.     Palpations: Abdomen is soft.     Tenderness: There is no abdominal tenderness. There is no right CVA tenderness or left CVA tenderness.     Hernia: No hernia is present.  Musculoskeletal:     Right lower leg: No edema.     Left lower leg: No edema.     Comments: Spontaneous movement of bilateral upper and lower extremities  Neurological:     Mental Status: He is alert and oriented to person, place, and time.  Psychiatric:        Behavior: Behavior is cooperative.     (all labs ordered are listed, but only abnormal results are displayed) Labs Reviewed  BASIC METABOLIC PANEL WITH GFR - Abnormal; Notable for the following components:      Result Value   Sodium 125 (*)    Potassium 5.8 (*)    Chloride 93 (*)    CO2 19 (*)    Glucose, Bld 1,010 (*)    Creatinine, Ser 1.67 (*)    Calcium  8.3 (*)    GFR, Estimated 53 (*)    All other components within normal limits  URINALYSIS, ROUTINE W REFLEX MICROSCOPIC - Abnormal; Notable for the following components:   Color, Urine COLORLESS (*)    Glucose, UA >=500 (*)    All other components within normal limits  CBG MONITORING, ED - Abnormal; Notable for the following components:    Glucose-Capillary >600 (*)    All other components within normal limits  I-STAT VENOUS BLOOD GAS, ED - Abnormal; Notable for the following components:   pCO2, Ven 34.9 (*)    pO2, Ven 99 (*)    Acid-base deficit 3.0 (*)    Sodium 120 (*)    Potassium 5.4 (*)    Calcium , Ion 0.92 (*)    All other components within normal limits  BETA-HYDROXYBUTYRIC ACID  CBC WITH DIFFERENTIAL/PLATELET  BASIC METABOLIC PANEL WITH GFR  BASIC METABOLIC PANEL WITH GFR  BASIC METABOLIC PANEL WITH GFR  BETA-HYDROXYBUTYRIC ACID  BETA-HYDROXYBUTYRIC ACID  BETA-HYDROXYBUTYRIC ACID  CBG MONITORING, ED    EKG: None  Radiology: No results found.   Procedures   Medications Ordered in the ED  lactated ringers  bolus 2,000 mL (0 mLs Intravenous Stopped 02/26/24 1037)    Clinical Course as of 02/26/24 1231  Thu Feb 26, 2024  1032 Patient removed left IV access after attempting to remove approximately 15 minutes ago unsuccessfully.  Infiltration of left IV noted, pressure dressing applied.  Patient request to leave AMA [AG]    Clinical Course User Index [AG] Nada Chroman, DO                                 Medical Decision Making Amount and/or Complexity of Data Reviewed Labs: ordered.  Risk Prescription drug management.   On initial evaluation patient is hemodynamically stable, afebrile and not in acute distress.  Point-of-care glucose obtained and greater than 600, will start LR bolus with 2L.  Laboratory studies obtained.  VBG with pH within normal limits, potassium 5.4 and bicarb within normal limits therefore less likely diabetic ketoacidosis.  Cannot rule out HHS at this time although patient's presentation most likely hyperglycemia.  During evaluation, patient manipulated left IV access causing fluid bolus to infiltrate.  Patient then removed IV stating he would like to leave AGAINST MEDICAL ADVICE.  In-depth discussion held with patient regarding leaving AMA including benefits of completing  medical workup with concern for severe electrolyte abnormalities, hypovolemic status or underlying infection versus risks of leaving.  Patient was able to verbalize risks of leaving AGAINST MEDICAL ADVICE including but not limited to death.  Patient left AMA prior to completion of medical evaluation.  Patient was able to complete approximately 500 cc liter bolus prior to leaving.  Upon independent review of laboratory studies resulting after patient left AGAINST MEDICAL ADVICE: CBC without evidence of underlying leukocytosis or anemia.  Urinalysis without evidence of ketonuria, infection  or hematuria.  BMP with hyperglycemia and hyperkalemia of 5.8.     Final diagnoses:  Hyperglycemia    ED Discharge Orders     None      Lavanda Bolster DO Emergency Medicine PGY2    Bolster Lavanda, DO 02/26/24 1232    Pamella Ozell LABOR, DO 03/06/24 1654

## 2024-02-27 DIAGNOSIS — E1100 Type 2 diabetes mellitus with hyperosmolarity without nonketotic hyperglycemic-hyperosmolar coma (NKHHC): Secondary | ICD-10-CM | POA: Diagnosis not present

## 2024-02-27 LAB — GLUCOSE, CAPILLARY
Glucose-Capillary: 139 mg/dL — ABNORMAL HIGH (ref 70–99)
Glucose-Capillary: 144 mg/dL — ABNORMAL HIGH (ref 70–99)
Glucose-Capillary: 159 mg/dL — ABNORMAL HIGH (ref 70–99)
Glucose-Capillary: 172 mg/dL — ABNORMAL HIGH (ref 70–99)
Glucose-Capillary: 240 mg/dL — ABNORMAL HIGH (ref 70–99)
Glucose-Capillary: 290 mg/dL — ABNORMAL HIGH (ref 70–99)
Glucose-Capillary: 322 mg/dL — ABNORMAL HIGH (ref 70–99)
Glucose-Capillary: 395 mg/dL — ABNORMAL HIGH (ref 70–99)

## 2024-02-27 LAB — BASIC METABOLIC PANEL WITH GFR
Anion gap: 10 (ref 5–15)
BUN: 14 mg/dL (ref 6–20)
CO2: 22 mmol/L (ref 22–32)
Calcium: 8.7 mg/dL — ABNORMAL LOW (ref 8.9–10.3)
Chloride: 101 mmol/L (ref 98–111)
Creatinine, Ser: 1.02 mg/dL (ref 0.61–1.24)
GFR, Estimated: >60 mL/min (ref >60)
Glucose, Bld: 151 mg/dL — ABNORMAL HIGH (ref 70–99)
Potassium: 3.7 mmol/L (ref 3.5–5.1)
Sodium: 133 mmol/L — ABNORMAL LOW (ref 135–145)

## 2024-02-27 LAB — CBC
HCT: 46.4 % (ref 39.0–52.0)
Hemoglobin: 15.9 g/dL (ref 13.0–17.0)
MCH: 28.5 pg (ref 26.0–34.0)
MCHC: 34.3 g/dL (ref 30.0–36.0)
MCV: 83.3 fL (ref 80.0–100.0)
Platelets: 240 K/uL (ref 150–400)
RBC: 5.57 MIL/uL (ref 4.22–5.81)
RDW: 12.3 % (ref 11.5–15.5)
WBC: 5.3 K/uL (ref 4.0–10.5)
nRBC: 0 % (ref 0.0–0.2)

## 2024-02-27 MED ORDER — INSULIN ASPART 100 UNIT/ML IJ SOLN
0.0000 [IU] | Freq: Every day | INTRAMUSCULAR | Status: DC
  Administered 2024-02-27: 3 [IU] via SUBCUTANEOUS

## 2024-02-27 MED ORDER — INSULIN GLARGINE-YFGN 100 UNIT/ML ~~LOC~~ SOLN
20.0000 [IU] | Freq: Once | SUBCUTANEOUS | Status: AC
  Administered 2024-02-27: 20 [IU] via SUBCUTANEOUS
  Filled 2024-02-27: qty 0.2

## 2024-02-27 MED ORDER — INSULIN ASPART 100 UNIT/ML IJ SOLN
0.0000 [IU] | Freq: Three times a day (TID) | INTRAMUSCULAR | Status: DC
  Administered 2024-02-27: 5 [IU] via SUBCUTANEOUS
  Administered 2024-02-27: 11 [IU] via SUBCUTANEOUS
  Administered 2024-02-27: 15 [IU] via SUBCUTANEOUS
  Administered 2024-02-28: 8 [IU] via SUBCUTANEOUS

## 2024-02-27 MED ORDER — INSULIN GLARGINE-YFGN 100 UNIT/ML ~~LOC~~ SOLN
15.0000 [IU] | Freq: Two times a day (BID) | SUBCUTANEOUS | Status: DC
  Administered 2024-02-27 – 2024-02-28 (×3): 15 [IU] via SUBCUTANEOUS
  Filled 2024-02-27 (×4): qty 0.15

## 2024-02-27 NOTE — Progress Notes (Signed)
  Progress Note   Patient: [name] [name] FMW:[id number] DOB: 24-Jul-1986 DOA: 02/26/2024     0 DOS: the patient was seen and examined on 02/27/2024   Assessment and Plan: HHS - Novolog  SS ACHS  - Semglee  15 units sq bid - Carb modified diet    HypoNa+  - Na+ 133 this morning   HyperK+  - Resolved   AKI - Resolved   HTN - Norvasc  5 mg PO daily   Subjective: Pt seen and examined at the bedside. He was transferred out of the ICU today. Blood sugars remain in a large range but no where 1010 (which is what he presented with). I suspect his insulin  will need to be titrated further prior to his discharge.   Physical Exam: Vitals:   02/27/24 0800 02/27/24 0900 02/27/24 1000 02/27/24 1144  BP:  (!) 158/91  (!) 158/87  Pulse: 85 83 84 100  Resp: 15 15 18    Temp: 98.2 F (36.8 C)     TempSrc: Oral     SpO2: 100% 99% 100%   Weight:      Height:       Physical Exam HENT:     Head: Normocephalic.     Mouth/Throat:     Mouth: Mucous membranes are moist.  Cardiovascular:     Rate and Rhythm: Normal rate.  Pulmonary:     Effort: Pulmonary effort is normal.  Abdominal:     Palpations: Abdomen is soft.  Musculoskeletal:        General: Normal range of motion.  Skin:    General: Skin is warm.  Neurological:     Mental Status: He is alert. Mental status is at baseline.  Psychiatric:        Mood and Affect: Mood normal.       Disposition: Status is: Observation The patient remains OBS appropriate and will d/c before 2 midnights.  Planned Discharge Destination: Home    Time spent: 35 minutes  Author: Jesselee Poth , MD 02/27/2024 12:21 PM  For on call review [email or web address].

## 2024-02-27 NOTE — Plan of Care (Signed)

## 2024-02-27 NOTE — Inpatient Diabetes Management (Incomplete)
 Inpatient Diabetes Program Recommendations  AACE/ADA: New Consensus Statement on Inpatient Glycemic Control (2015)  Target Ranges:  Prepandial:   less than 140 mg/dL      Peak postprandial:   less than 180 mg/dL (1-2 hours)      Critically ill patients:  140 - 180 mg/dL   Lab Results  Component Value Date   GLUCAP 240 (H) 02/27/2024   HGBA1C 12.9 (H) 11/29/2023    Review of Glycemic Control  Diabetes history: DM2 Outpatient Diabetes medications: Lantus  30 at bedtime, Humalog  15 TID Current orders for Inpatient glycemic control: Semglee  15 BID, Novolog  0-15 TID with meals and 0-5 HS  HgbA1C 12.9%  Inpatient Diabetes Program Recommendations:

## 2024-02-28 DIAGNOSIS — E1100 Type 2 diabetes mellitus with hyperosmolarity without nonketotic hyperglycemic-hyperosmolar coma (NKHHC): Secondary | ICD-10-CM | POA: Diagnosis not present

## 2024-02-28 LAB — CBC
HCT: 44.9 % (ref 39.0–52.0)
Hemoglobin: 15.1 g/dL (ref 13.0–17.0)
MCH: 28.2 pg (ref 26.0–34.0)
MCHC: 33.6 g/dL (ref 30.0–36.0)
MCV: 83.9 fL (ref 80.0–100.0)
Platelets: 246 K/uL (ref 150–400)
RBC: 5.35 MIL/uL (ref 4.22–5.81)
RDW: 12.4 % (ref 11.5–15.5)
WBC: 4.4 K/uL (ref 4.0–10.5)
nRBC: 0 % (ref 0.0–0.2)

## 2024-02-28 LAB — COMPREHENSIVE METABOLIC PANEL WITH GFR
ALT: 18 U/L (ref 0–44)
AST: 22 U/L (ref 15–41)
Albumin: 2.7 g/dL — ABNORMAL LOW (ref 3.5–5.0)
Alkaline Phosphatase: 66 U/L (ref 38–126)
Anion gap: 6 (ref 5–15)
BUN: 19 mg/dL (ref 6–20)
CO2: 21 mmol/L — ABNORMAL LOW (ref 22–32)
Calcium: 8.2 mg/dL — ABNORMAL LOW (ref 8.9–10.3)
Chloride: 101 mmol/L (ref 98–111)
Creatinine, Ser: 0.84 mg/dL (ref 0.61–1.24)
GFR, Estimated: >60 mL/min (ref >60)
Glucose, Bld: 244 mg/dL — ABNORMAL HIGH (ref 70–99)
Potassium: 4.3 mmol/L (ref 3.5–5.1)
Sodium: 128 mmol/L — ABNORMAL LOW (ref 135–145)
Total Bilirubin: 0.6 mg/dL (ref 0.0–1.2)
Total Protein: 5.8 g/dL — ABNORMAL LOW (ref 6.5–8.1)

## 2024-02-28 LAB — MAGNESIUM: Magnesium: 1.8 mg/dL (ref 1.7–2.4)

## 2024-02-28 LAB — PHOSPHORUS: Phosphorus: 3.2 mg/dL (ref 2.5–4.6)

## 2024-02-28 MED ORDER — INSULIN ASPART 100 UNIT/ML IJ SOLN
5.0000 [IU] | Freq: Three times a day (TID) | INTRAMUSCULAR | Status: DC
  Administered 2024-02-28: 5 [IU] via SUBCUTANEOUS

## 2024-02-28 MED ORDER — LANTUS SOLOSTAR 100 UNIT/ML ~~LOC~~ SOPN: 40.0000 [IU] | PEN_INJECTOR | Freq: Every day | SUBCUTANEOUS | 2 refills | Status: DC

## 2024-02-28 MED ORDER — INSULIN LISPRO (1 UNIT DIAL) 100 UNIT/ML (KWIKPEN): 8.0000 [IU] | PEN_INJECTOR | Freq: Three times a day (TID) | SUBCUTANEOUS | 2 refills | Status: DC

## 2024-02-28 NOTE — Discharge Summary (Signed)
 Physician Discharge Summary  [name] [name] FMW:[id number] DOB: July 29, 1986 DOA: 02/26/2024  PCP: Pcp, No  Admit date: 02/26/2024 Discharge date: 02/28/24  Admitted From: Home Disposition: Home Recommendations for Outpatient Follow-up:  Outpatient follow-up with PCP as soon as possible Assess glycemic control, compliance, CMP and CBC at follow-up Please follow up on the following pending results: None  Home Health: No need identified Equipment/Devices: No need identified  Discharge Condition: Stable CODE STATUS: Full code Diet Orders (From admission, onward)     Start     Ordered   02/28/24 0000  Diet - low sodium heart healthy        02/28/24 1043   02/28/24 0000  Diet Carb Modified        02/28/24 1043              Hospital course [name] with PMH of poorly controlled IDDM-2, HTN and tobacco use disorder presented to ED with polyuria, polydipsia and weakness, and admitted with hyperosmolar hyperglycemic state.  Reportedly ran out of his insulin  for 3 to 4 days.  BMP glucose elevated to 1000.  Sodium 120.  K5.8.  CO2 19.  AG 13. Cr 1.67.  Patient was started on IV fluid and insulin  drip and admitted for further care.  Eventually, hyperglycemia improved and he was transitioned to subcu insulin .  AKI resolved.  On the day of discharge, felt well and ready to go home.  He is discharged on Lantus  40 units nightly and NovoLog  8 units 3 times daily with meals.  Counseled on the importance of medication and dietary compliance.   See individual problem list below for more.   Problems addressed during this hospitalization Poorly controlled IDDM-2 with HHS and hyperglycemia: A1c 12.9% in 11/2023. -Increase home Semglee  from 30 units to 40 units daily - NovoLog  8 units 3 times daily with meals - Consulted on the importance of medication and dietary compliance  Pseudohyponatremia: Corrects to normal for hyperglycemia  Essential hypertension: BP within acceptable range.   Continue home medication  Tobacco use disorder: Reports smoking half a pack a day. -Encouraged smoking cessation   AKI: Resolved  Hyperkalemia: Resolved Body mass index is 28.84 kg/m.           Consultations: None  Time spent 35  minutes  Vital signs Vitals:   02/27/24 1000 02/27/24 1144 02/27/24 1945 02/28/24 0454  BP:  (!) 158/87 136/86 (!) 157/93  Pulse: 84 100 82   Temp:   97.7 F (36.5 C) 97.8 F (36.6 C)  Resp: 18  17   Height:      Weight:      SpO2: 100%  99% 100%  TempSrc:    Axillary  BMI (Calculated):         Discharge exam  GENERAL: No apparent distress.  Nontoxic. HEENT: MMM.  Vision and hearing grossly intact.  NECK: Supple.  No apparent JVD.  RESP:  No IWOB.  Fair aeration bilaterally. CVS:  RRR. Heart sounds normal.  ABD/GI/GU: BS+. Abd soft, NTND.  MSK/EXT:  Moves extremities. No apparent deformity. No edema.  SKIN: no apparent skin lesion or wound NEURO: Awake and alert. Oriented appropriately.  No apparent focal neuro deficit. PSYCH: Calm. Normal affect.   Discharge Instructions Discharge Instructions     Diet - low sodium heart healthy   Complete by: As directed    Diet Carb Modified   Complete by: As directed    Discharge instructions   Complete by: As directed  It has been a pleasure taking care of you!  You were hospitalized due to elevated blood glucose for which you have been treated with insulin .  Your blood glucose improved.  We have adjusted your insulin .  Please use the medication as prescribed.  We also recommend you watch your carbohydrate intake.  See a separate instruction about the diabetes and nutrition.  Follow-up with your primary care doctor or endocrinologist in 1 to 2 weeks.  It is important that you quit smoking cigarettes.  You may use nicotine  patch to help you quit smoking.  Nicotine  patch is available over-the-counter.  You may also discuss other options to help you quit smoking with your primary care  doctor. You can also talk to professional counselors at 1-800-QUIT-NOW 415-822-3580) for free smoking cessation counseling.   Take care,   Increase activity slowly   Complete by: As directed       Allergies as of 02/28/2024   No Known Allergies      Medication List     TAKE these medications    Accu-Chek Softclix Lancets lancets Use to check blood sugar three times daily   amLODipine  5 MG tablet Commonly known as: NORVASC  Take 1 tablet (5 mg total) by mouth daily.   insulin  lispro 100 UNIT/ML KwikPen Commonly known as: HUMALOG  Inject 8 Units into the skin 3 (three) times daily with meals. Only take if eating a meal AND Blood Glucose (BG) is 80 or higher. What changed:  how much to take when to take this additional instructions   Lancet Device Misc Use 3 (three) times daily.   Lantus  SoloStar 100 UNIT/ML Solostar Pen Generic drug: insulin  glargine Inject 40 Units into the skin at bedtime. What changed: how much to take   Pen Needles 31G X 5 MM Misc Use  three times daily         Procedures/Studies:   CT Maxillofacial WO CM Result Date: 02/15/2024 CLINICAL DATA:  Head trauma, moderate-severe found laying in laundry mat with abrasions on nose and back. EXAM: CT HEAD WITHOUT CONTRAST CT MAXILLOFACIAL WITHOUT CONTRAST TECHNIQUE: Multidetector CT imaging of the head and maxillofacial structures were performed using the standard protocol without intravenous contrast. Multiplanar CT image reconstructions of the maxillofacial structures were also generated. RADIATION DOSE REDUCTION: This exam was performed according to the departmental dose-optimization program which includes automated exposure control, adjustment of the mA and/or kV according to patient size and/or use of iterative reconstruction technique. COMPARISON: CT head 11/29/2023 FINDINGS: CT HEAD FINDINGS Brain: No evidence of large-territorial acute infarction. No parenchymal hemorrhage. No mass lesion. No  extra-axial collection. No mass effect or midline shift. No hydrocephalus. Basilar cisterns are patent. Vascular: No hyperdense vessel. Skull: No acute fracture or focal lesion. Other: Nonspecific right scalp 8 mm vague hyperdensity that may represent a small scalp hematoma. CT MAXILLOFACIAL FINDINGS Osseous: Age-indeterminate nasal septum fracture (11:30). Chronic bilateral nasal bone fractures. No definite acute displaced fracture or mandibular dislocation. No destructive process. Periapical lucencies along several mandibular teeth. Caries noted. Sinuses/Orbits: Paranasal sinuses and mastoid air cells are clear. The orbits are unremarkable. Soft tissues: Negative. Other: Visualized upper cervical spine unremarkable. IMPRESSION: 1. No acute intracranial abnormality. 2. No definite acute displaced facial fracture. 3. Chronic bilateral nasal bone fractures. Age-indeterminate nasal septum fracture. 4. Periapical lucencies along several mandibular teeth. Findings may represent infection. 5. Caries. Electronically Signed   By: Morgane  Naveau M.D.   On: 02/15/2024 18:59   DG Hand Complete Right Result Date: 02/15/2024 CLINICAL  DATA:  hand pain EXAM: RIGHT HAND - COMPLETE 3+ VIEW COMPARISON:  None Available. FINDINGS: There is no evidence of fracture or dislocation. There is no evidence of arthropathy or other focal bone abnormality. Soft tissues are unremarkable. IMPRESSION: No acute displaced fracture or dislocation. Electronically Signed   By: Morgane  Naveau M.D.   On: 02/15/2024 18:58   DG Hand Complete Left Result Date: 02/15/2024 CLINICAL DATA:  hand pain EXAM: LEFT HAND - COMPLETE 3+ VIEW COMPARISON:  X-ray left wrist 06/26/2008 FINDINGS: Lucency overlying the scaphoid likely artifactual due to overlying distal radius. There is no evidence of fracture or dislocation. There is no evidence of arthropathy or other focal bone abnormality. Soft tissues are unremarkable. IMPRESSION: No acute displaced fracture or  dislocation. Electronically Signed   By: Morgane  Naveau M.D.   On: 02/15/2024 18:58   CT Head Wo Contrast Result Date: 02/15/2024 CLINICAL DATA:  Head trauma, moderate-severe found laying in laundry mat with abrasions on nose and back. EXAM: CT HEAD WITHOUT CONTRAST CT MAXILLOFACIAL WITHOUT CONTRAST TECHNIQUE: Multidetector CT imaging of the head and maxillofacial structures were performed using the standard protocol without intravenous contrast. Multiplanar CT image reconstructions of the maxillofacial structures were also generated. RADIATION DOSE REDUCTION: This exam was performed according to the departmental dose-optimization program which includes automated exposure control, adjustment of the mA and/or kV according to patient size and/or use of iterative reconstruction technique. COMPARISON:  CT head 11/29/2023 FINDINGS: CT HEAD FINDINGS Brain: No evidence of large-territorial acute infarction. No parenchymal hemorrhage. No mass lesion. No extra-axial collection. No mass effect or midline shift. No hydrocephalus. Basilar cisterns are patent. Vascular: No hyperdense vessel. Skull: No acute fracture or focal lesion. Other: Nonspecific right scalp 8 mm vague hyperdensity that may represent a small scalp hematoma. CT MAXILLOFACIAL FINDINGS Osseous: Age-indeterminate nasal septum fracture (11:30). Chronic bilateral nasal bone fractures. No definite acute displaced fracture or mandibular dislocation. No destructive process. Periapical lucencies along several mandibular teeth. Caries noted. Sinuses/Orbits: Paranasal sinuses and mastoid air cells are clear. The orbits are unremarkable. Soft tissues: Negative. Other: Visualized upper cervical spine unremarkable. IMPRESSION: 1. No acute intracranial abnormality. 2.  No definite acute displaced facial fracture. 3. Chronic bilateral nasal bone fractures. Age-indeterminate nasal septum fracture. 4. Periapical lucencies along several mandibular teeth. Findings may  represent infection. 5. Caries. Electronically Signed   By: Morgane  Naveau M.D.   On: 02/15/2024 18:56   DG Chest 1 View Result Date: 02/06/2024 CLINICAL DATA:  cp EXAM: CHEST  1 VIEW COMPARISON:  October 21, 2023 FINDINGS: Low lung volumes. No focal airspace consolidation, pleural effusion, or pneumothorax. No cardiomegaly.No acute fracture or destructive lesion. IMPRESSION: Low lung volumes.  Otherwise, no acute cardiopulmonary abnormality. Electronically Signed   By: Rogelia Myers M.D.   On: 02/06/2024 19:25       The results of significant diagnostics from this hospitalization (including imaging, microbiology, ancillary and laboratory) are listed below for reference.     Microbiology: Recent Results (from the past 240 hours)  MRSA Next Gen by PCR, Nasal     Status: None   Collection Time: 02/26/24  3:14 PM   Specimen: Nasal Mucosa; Nasal Swab  Result Value Ref Range Status   MRSA by PCR Next Gen NOT DETECTED NOT DETECTED Final    Comment: (NOTE) The GeneXpert MRSA Assay (FDA approved for NASAL specimens only), is one component of a comprehensive MRSA colonization surveillance program. It is not intended to diagnose MRSA infection nor to guide or monitor  treatment for MRSA infections. Test performance is not FDA approved in patients less than 80 years old. Performed at [facility], [address], [place], [place] [address]      Labs:  CBC: Recent Labs  Lab 02/26/24 0950 02/26/24 1009 02/27/24 0309 02/28/24 0848  WBC 5.5  --  5.3 4.4  NEUTROABS 3.8  --   --   --   HGB 14.7 15.3 15.9 15.1  HCT 43.6 45.0 46.4 44.9  MCV 85.2  --  83.3 83.9  PLT 238  --  240 246   BMP &GFR Recent Labs  Lab 02/26/24 0950 02/26/24 1009 02/26/24 1438 02/26/24 2213 02/27/24 0309 02/28/24 0848  NA 125* 120* 128* 134* 133* 128*  K 5.8* 5.4* 4.5 3.3* 3.7 4.3  CL 93*  --  93* 100 101 101  CO2 19*  --  22 25 22  21*  GLUCOSE 1,010*  --  563* 157* 151* 244*  BUN 14   --  19 15 14 19   CREATININE 1.67*  --  1.34* 0.98 1.02 0.84  CALCIUM  8.3*  --  9.1 8.9 8.7* 8.2*  MG  --   --   --   --   --  1.8  PHOS  --   --   --   --   --  3.2   Estimated Creatinine Clearance: 138.1 mL/min (by C-G formula based on SCr of 0.84 mg/dL). Liver & Pancreas: Recent Labs  Lab 02/28/24 0848  AST 22  ALT 18  ALKPHOS 66  BILITOT 0.6  PROT 5.8*  ALBUMIN 2.7*   No results for input(s): LIPASE, AMYLASE in the last 168 hours. No results for input(s): AMMONIA in the last 168 hours. Diabetic: No results for input(s): HGBA1C in the last 72 hours. Recent Labs  Lab 02/27/24 0259 02/27/24 0749 02/27/24 1146 02/27/24 1534 02/27/24 2102  GLUCAP 139* 240* 322* 395* 290*   Cardiac Enzymes: No results for input(s): CKTOTAL, CKMB, CKMBINDEX, TROPONINI in the last 168 hours. No results for input(s): PROBNP in the last 8760 hours. Coagulation Profile: No results for input(s): INR, PROTIME in the last 168 hours. Thyroid  Function Tests: No results for input(s): TSH, T4TOTAL, FREET4, T3FREE, THYROIDAB in the last 72 hours. Lipid Profile: No results for input(s): CHOL, HDL, LDLCALC, TRIG, CHOLHDL, LDLDIRECT in the last 72 hours. Anemia Panel: No results for input(s): VITAMINB12, FOLATE, FERRITIN, TIBC, IRON, RETICCTPCT in the last 72 hours. Urine analysis:    Component Value Date/Time   COLORURINE COLORLESS (A) 02/26/2024 1000   APPEARANCEUR CLEAR 02/26/2024 1000   LABSPEC 1.022 02/26/2024 1000   PHURINE 6.0 02/26/2024 1000   GLUCOSEU >=500 (A) 02/26/2024 1000   HGBUR NEGATIVE 02/26/2024 1000   BILIRUBINUR NEGATIVE 02/26/2024 1000   KETONESUR NEGATIVE 02/26/2024 1000   PROTEINUR NEGATIVE 02/26/2024 1000   NITRITE NEGATIVE 02/26/2024 1000   LEUKOCYTESUR NEGATIVE 02/26/2024 1000   Sepsis Labs: Invalid input(s): PROCALCITONIN, LACTICIDVEN   SIGNED:  Juliann Olesky T Estephania Licciardi, MD  [facility] 02/28/2024, 5:57  PM

## 2024-02-28 NOTE — TOC Initial Note (Signed)
 Transition of Care [facility]) - Initial/Assessment Note    Patient Details  Name: [name] [name] MRN: [id number] Date of Birth: 1985/11/11  [facility] CM/SW Contact:    Sonda Manuella Quill, RN Phone Number: 02/28/2024, 11:24 AM  Clinical Narrative:                 [facility] consult for medication assistance, and PCP; spoke w/ pt in room; pt says he lives w/ his cousin; he plans to return at d/c; he identified POC Joanna Hall (father) [phone]; pt requested bus pass for transportation; he denied SDOH risks; pt verified insurance; he verified he does not have PCP; pt does not have DME, HH services, or home oxygen; pt agreed to receive resources for [facility], and transportation; resources placed in d/c instruction, and copy given to pt; he was also notified that he can contact his insurance for his assigned provider; pt verbalized understanding that he will make his own appt w/ agency/provider of choice; pt has insurance and does not qualify for [facility]; pt says he will ask his family to assist to pay for medication; Bobbi, RN given bus pass for pt; no TOC needs.  Expected Discharge Plan: Home/Self Care Barriers to Discharge: No Barriers Identified   Patient Goals and CMS Choice Patient states their goals for this hospitalization and ongoing recovery are:: home          Expected Discharge Plan and Services   Discharge Planning Services: CM Consult   Living arrangements for the past 2 months: Single Family Home Expected Discharge Date: 02/28/24               DME Arranged: N/A DME Agency: NA       HH Arranged: NA HH Agency: NA        Prior Living Arrangements/Services Living arrangements for the past 2 months: Single Family Home Lives with:: Relatives Patient language and need for interpreter reviewed:: Yes Do you feel safe going back to the place where you live?: Yes      Need for Family Participation in Patient Care: Yes (Comment) Care  giver support system in place?: Yes (comment) Current home services:  (n/a) Criminal Activity/Legal Involvement Pertinent to Current Situation/Hospitalization: No - Comment as needed  Activities of Daily Living   ADL Screening (condition at time of admission) Independently performs ADLs?: Yes (appropriate for developmental age) Is the patient deaf or have difficulty hearing?: No Does the patient have difficulty seeing, even when wearing glasses/contacts?: No Does the patient have difficulty concentrating, remembering, or making decisions?: No  Permission Sought/Granted Permission sought to share information with : Case Manager Permission granted to share information with : Yes, Verbal Permission Granted  Share Information with NAME: Case Manager     Permission granted to share info w Relationship: Oz Gammel (father) [phone]     Emotional Assessment Appearance:: Appears stated age Attitude/Demeanor/Rapport: Gracious Affect (typically observed): Accepting Orientation: : Oriented to Self, Oriented to Place, Oriented to  Time, Oriented to Situation Alcohol / Substance Use: Not Applicable Psych Involvement: No (comment)  Admission diagnosis:  Hyperglycemia [R73.9] Hyperosmolar hyperglycemic state (HHS) (HCC) [E11.00] Patient Active Problem List   Diagnosis Date Noted   Chest pain 02/07/2024   Syncope 12/12/2023   DKA (diabetic ketoacidosis) (HCC) 11/30/2023   Hyperosmolar non-ketotic state due to type 2 diabetes mellitus (HCC) 11/29/2023   Type 2 diabetes mellitus with hyperosmolar hyperglycemic state (HHS) (HCC) 08/25/2023   Sepsis (HCC) 07/20/2023   Influenza  B 07/19/2023   Current episode of major depressive disorder without prior episode 07/14/2023   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   Hyperkalemia 07/04/2023   Hypoglycemia associated with type 2 diabetes mellitus (HCC) 05/07/2023   Hyperglycemia 04/20/2023   Acute metabolic encephalopathy 04/20/2023   Abdominal  pain 01/01/2023   Tobacco abuse 01/01/2023   MSSA bacteremia 05/19/2022   Hyperosmolar hyperglycemic state (HHS) (HCC) 05/16/2022   Hypertension associated with diabetes (HCC) 05/08/2022   AKI (acute kidney injury) (HCC) 05/08/2022   Pseudohyponatremia 05/08/2022   DM2 (diabetes mellitus, type 2) (HCC) 05/08/2022   Substance induced mood disorder (HCC) 04/06/2022   PCP:  Freddrick, No Pharmacy:   [id number] [address], [place] - [address] AT [facility] [address] [place] [address] Phone: [phone] Fax: [phone]     Social Drivers of Health (SDOH) Social History: SDOH Screenings   Food Insecurity: No Food Insecurity (02/28/2024)  Housing: Low Risk  (02/28/2024)  Recent Concern: Housing - High Risk (02/27/2024)  Transportation Needs: Unmet Transportation Needs (02/28/2024)  Utilities: Not At Risk (02/28/2024)  Tobacco Use: High Risk (02/26/2024)   SDOH Interventions: Food Insecurity Interventions: Intervention Not Indicated, Inpatient TOC Housing Interventions: Intervention Not Indicated, Inpatient TOC Transportation Interventions: Inpatient TOC, Bus Pass Given Utilities Interventions: Intervention Not Indicated, Inpatient TOC   Readmission Risk Interventions    09/30/2023   11:37 AM 07/21/2023   12:30 PM 07/04/2023   12:49 PM  Readmission Risk Prevention Plan  Transportation Screening Complete Complete Complete  Medication Review Oceanographer) Complete Complete Complete  PCP or Specialist appointment within 3-5 days of discharge  Complete Complete  HRI or Home Care Consult Complete Complete Complete  HRI or Home Care Consult Pt Refusal Comments  N/A   SW Recovery Care/Counseling Consult Complete Complete Complete  Palliative Care Screening Not Applicable Not Applicable Not Applicable  Skilled Nursing Facility Not Applicable Not Applicable Not Applicable

## 2024-02-28 NOTE — Plan of Care (Signed)
  Problem: Nutrition: Goal: Adequate nutrition will be maintained Outcome: Progressing   Problem: Nutrition: Goal: Adequate nutrition will be maintained Outcome: Progressing   

## 2024-02-28 NOTE — Discharge Instructions (Addendum)
 [facility]

## 2024-03-01 ENCOUNTER — Other Ambulatory Visit (HOSPITAL_COMMUNITY): Payer: Self-pay

## 2024-03-01 LAB — HEMOGLOBIN A1C
Hgb A1c MFr Bld: >15.5 % — ABNORMAL HIGH (ref 4.8–5.6)
Mean Plasma Glucose: >398 mg/dL

## 2024-03-03 ENCOUNTER — Emergency Department (HOSPITAL_COMMUNITY)
Admission: EM | Admit: 2024-03-03 | Discharge: 2024-03-03 | Disposition: A | Discharge status: Home / Self Care | Attending: Student | Admitting: Student

## 2024-03-03 DIAGNOSIS — R531 Weakness: Secondary | ICD-10-CM | POA: Insufficient documentation

## 2024-03-03 DIAGNOSIS — Z79899 Other long term (current) drug therapy: Secondary | ICD-10-CM | POA: Diagnosis not present

## 2024-03-03 DIAGNOSIS — Z794 Long term (current) use of insulin: Secondary | ICD-10-CM | POA: Insufficient documentation

## 2024-03-03 DIAGNOSIS — E1165 Type 2 diabetes mellitus with hyperglycemia: Secondary | ICD-10-CM | POA: Insufficient documentation

## 2024-03-03 DIAGNOSIS — F17210 Nicotine dependence, cigarettes, uncomplicated: Secondary | ICD-10-CM | POA: Diagnosis not present

## 2024-03-03 DIAGNOSIS — R739 Hyperglycemia, unspecified: Secondary | ICD-10-CM

## 2024-03-03 DIAGNOSIS — I10 Essential (primary) hypertension: Secondary | ICD-10-CM | POA: Diagnosis not present

## 2024-03-03 LAB — CBC WITH DIFFERENTIAL/PLATELET
Abs Immature Granulocytes: 0.01 K/uL (ref 0.00–0.07)
Basophils Absolute: 0 K/uL (ref 0.0–0.1)
Basophils Relative: 1 %
Eosinophils Absolute: 0.1 K/uL (ref 0.0–0.5)
Eosinophils Relative: 1 %
HCT: 46.1 % (ref 39.0–52.0)
Hemoglobin: 15.3 g/dL (ref 13.0–17.0)
Immature Granulocytes: 0 %
Lymphocytes Relative: 34 %
Lymphs Abs: 2 K/uL (ref 0.7–4.0)
MCH: 27.9 pg (ref 26.0–34.0)
MCHC: 33.2 g/dL (ref 30.0–36.0)
MCV: 84.1 fL (ref 80.0–100.0)
Monocytes Absolute: 0.5 K/uL (ref 0.1–1.0)
Monocytes Relative: 9 %
Neutro Abs: 3.3 K/uL (ref 1.7–7.7)
Neutrophils Relative %: 55 %
Platelets: 216 K/uL (ref 150–400)
RBC: 5.48 MIL/uL (ref 4.22–5.81)
RDW: 12.1 % (ref 11.5–15.5)
WBC: 5.9 K/uL (ref 4.0–10.5)
nRBC: 0 % (ref 0.0–0.2)

## 2024-03-03 LAB — COMPREHENSIVE METABOLIC PANEL WITH GFR
ALT: 21 U/L (ref 0–44)
AST: 22 U/L (ref 15–41)
Albumin: 4.1 g/dL (ref 3.5–5.0)
Alkaline Phosphatase: 74 U/L (ref 38–126)
Anion gap: 10 (ref 5–15)
BUN: 17 mg/dL (ref 6–20)
CO2: 26 mmol/L (ref 22–32)
Calcium: 9.3 mg/dL (ref 8.9–10.3)
Chloride: 93 mmol/L — ABNORMAL LOW (ref 98–111)
Creatinine, Ser: 1.35 mg/dL — ABNORMAL HIGH (ref 0.61–1.24)
GFR, Estimated: >60 mL/min (ref >60)
Glucose, Bld: 413 mg/dL — ABNORMAL HIGH (ref 70–99)
Potassium: 3.9 mmol/L (ref 3.5–5.1)
Sodium: 129 mmol/L — ABNORMAL LOW (ref 135–145)
Total Bilirubin: 1.2 mg/dL (ref 0.0–1.2)
Total Protein: 7.9 g/dL (ref 6.5–8.1)

## 2024-03-03 LAB — CBG MONITORING, ED
Glucose-Capillary: 395 mg/dL — ABNORMAL HIGH (ref 70–99)
Glucose-Capillary: 381 mg/dL — ABNORMAL HIGH (ref 70–99)
Glucose-Capillary: 229 mg/dL — ABNORMAL HIGH (ref 70–99)

## 2024-03-03 LAB — BLOOD GAS, VENOUS
Acid-Base Excess: 3.2 mmol/L — ABNORMAL HIGH (ref 0.0–2.0)
Bicarbonate: 30.6 mmol/L — ABNORMAL HIGH (ref 20.0–28.0)
O2 Saturation: 20.9 %
Patient temperature: 37
pCO2, Ven: 58 mmHg (ref 44–60)
pH, Ven: 7.33 (ref 7.25–7.43)
pO2, Ven: <31 mmHg — CL (ref 32–45)

## 2024-03-03 LAB — BETA-HYDROXYBUTYRIC ACID: Beta-Hydroxybutyric Acid: 0.58 mmol/L — ABNORMAL HIGH (ref 0.05–0.27)

## 2024-03-03 MED ORDER — LACTATED RINGERS IV BOLUS
1000.0000 mL | Freq: Once | INTRAVENOUS | Status: AC
  Administered 2024-03-03: 1000 mL via INTRAVENOUS

## 2024-03-03 MED ORDER — INSULIN ASPART 100 UNIT/ML IJ SOLN
10.0000 [IU] | Freq: Once | INTRAMUSCULAR | Status: AC
  Administered 2024-03-03: 10 [IU] via SUBCUTANEOUS
  Filled 2024-03-03: qty 0.1

## 2024-03-03 NOTE — ED Provider Notes (Signed)
 Care was taken over from Dr. Von.  Patient presented with elevated blood sugars.  He has a history of insulin -dependent diabetes.  He does not have any suggestions of DKA.  He has a history of medication noncompliance.  Discussed taking his medications.  He says he does have insulin  to use.  He has been given resources in the past for outpatient follow-up.  Discussed with him obtaining appointment for PCP.  He was discharged home in good condition.  His glucose is improved during his ED stay.  His blood pressure was elevated.  Discussed making sure that he is taking his blood pressure medication.  Return precautions were given.   Lenor Hollering, MD 03/03/24 [phone]

## 2024-03-03 NOTE — ED Provider Notes (Signed)
 [place] EMERGENCY DEPARTMENT AT [facility] Provider Note  CSN: [id number] Arrival date & time: 03/03/24 9485  Chief Complaint(s) Weakness and Abdominal Pain  HPI [name] [name] is a 38 y.o. male with PMH insulin -dependent diabetes, multiple previous hospital presentations for both DKA and HHS, HTN, amphetamine abuse who presents emerged from for evaluation of generalized weakness, hyperglycemia and abdominal pain.  States that symptoms have been present for the last 24 hours and sugar at home was 377.  Denies nausea, vomiting, headache, fever or other systemic symptoms.   Past Medical History Past Medical History:  Diagnosis Date   Diabetes mellitus without complication (HCC)    DKA (diabetic ketoacidosis) (HCC) 05/08/2022   HTN (hypertension) 05/08/2022   Hyperlipidemia    Hyperosmolar hyperglycemic state (HHS) (HCC) 05/08/2022   Hypertension    Hypoglycemia 07/02/2019   Pseudohyponatremia 05/08/2022   Patient Active Problem List   Diagnosis Date Noted   Chest pain 02/07/2024   Syncope 12/12/2023   DKA (diabetic ketoacidosis) (HCC) 11/30/2023   Hyperosmolar non-ketotic state due to type 2 diabetes mellitus (HCC) 11/29/2023   Type 2 diabetes mellitus with hyperosmolar hyperglycemic state (HHS) (HCC) 08/25/2023   Sepsis (HCC) 07/20/2023   Influenza B 07/19/2023   Current episode of major depressive disorder without prior episode 07/14/2023   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   Hyperkalemia 07/04/2023   Hypoglycemia associated with type 2 diabetes mellitus (HCC) 05/07/2023   Hyperglycemia 04/20/2023   Acute metabolic encephalopathy 04/20/2023   Abdominal pain 01/01/2023   Tobacco abuse 01/01/2023   MSSA bacteremia 05/19/2022   Hyperosmolar hyperglycemic state (HHS) (HCC) 05/16/2022   Hypertension associated with diabetes (HCC) 05/08/2022   AKI (acute kidney injury) (HCC) 05/08/2022   Pseudohyponatremia 05/08/2022   DM2 (diabetes mellitus, type 2) (HCC)  05/08/2022   Substance induced mood disorder (HCC) 04/06/2022   Home Medication(s) Prior to Admission medications   Medication Sig Start Date End Date Taking? Authorizing Provider  amLODipine  (NORVASC ) 5 MG tablet Take 1 tablet (5 mg total) by mouth daily. Patient not taking: Reported on 02/26/2024 01/03/24   Vicky Charleston, PA-C  insulin  lispro (HUMALOG ) 100 UNIT/ML KwikPen Inject 8 Units into the skin 3 (three) times daily with meals. Only take if eating a meal AND Blood Glucose (BG) is 80 or higher. 02/28/24   Gonfa, Taye T, MD  Insulin  Pen Needle (PEN NEEDLES) 31G X 5 MM MISC Use  three times daily 09/17/23   Neldon Hamp RAMAN, PA  Lancet Device MISC Use 3 (three) times daily. 09/17/23   Neldon Hamp RAMAN, PA  Lancets MISC Use to check blood sugar three times daily Patient not taking: Reported on 02/26/2024 08/26/23   Jonel Lonni SQUIBB, MD  LANTUS  SOLOSTAR 100 UNIT/ML Solostar Pen Inject 40 Units into the skin at bedtime. 02/28/24   Kathrin Mignon DASEN, MD  Past Surgical History Past Surgical History:  Procedure Laterality Date   HAND SURGERY     Family History Family History  Family history unknown: Yes    Social History Social History   Tobacco Use   Smoking status: Every Day    Types: Cigarettes   Smokeless tobacco: Never  Vaping Use   Vaping status: Unknown  Substance Use Topics   Alcohol use: Never   Drug use: Yes    Types: Amphetamines, Marijuana   Allergies Patient has no known allergies.  Review of Systems Review of Systems  Gastrointestinal:  Positive for abdominal pain.  Endocrine: Positive for polydipsia and polyuria.    Physical Exam Vital Signs  I have reviewed the triage vital signs There were no vitals taken for this visit.  Physical Exam Vitals and nursing note reviewed.  Constitutional:      General: He is not in acute  distress.    Appearance: He is well-developed.  HENT:     Head: Normocephalic and atraumatic.  Cardiovascular:     Rate and Rhythm: Normal rate and regular rhythm.     Heart sounds: No murmur heard. Pulmonary:     Effort: Pulmonary effort is normal. No respiratory distress.     Breath sounds: Normal breath sounds.  Abdominal:     Palpations: Abdomen is soft.     Tenderness: There is no abdominal tenderness.  Musculoskeletal:        General: No swelling.     Cervical back: Neck supple.  Skin:    General: Skin is warm and dry.     Capillary Refill: Capillary refill takes less than 2 seconds.  Neurological:     Mental Status: He is alert.  Psychiatric:        Mood and Affect: Mood normal.     ED Results and Treatments Labs (all labs ordered are listed, but only abnormal results are displayed) Labs Reviewed  COMPREHENSIVE METABOLIC PANEL WITH GFR  CBC WITH DIFFERENTIAL/PLATELET  BLOOD GAS, VENOUS  BETA-HYDROXYBUTYRIC ACID  URINALYSIS, ROUTINE W REFLEX MICROSCOPIC  CBG MONITORING, ED                                                                                                                          Radiology No results found.  Pertinent labs & imaging results that were available during my care of the patient were reviewed by me and considered in my medical decision making (see MDM for details).  Medications Ordered in ED Medications  lactated ringers  bolus 1,000 mL (has no administration in time range)  Procedures Procedures  (including critical care time)  Medical Decision Making / ED Course   This patient presents to the ED for concern of weakness, abdominal pain, polyuria, polydipsia, this involves an extensive number of treatment options, and is a complaint that carries with it a high risk of complications and morbidity.  The  differential diagnosis includes KA, HHS, stress hyperglycemia, electrolyte abnormality, gastroenteritis, intra-abdominal infection  MDM: Patient seen emerged part for evaluation of generalized weakness and hyperglycemia with some mild abdominal pain.  Physical exam with a soft benign nontender abdomen, bilateral conjunctival injection.  Laboratory evaluation with an initial glucose of 395.  Pseudohyponatremia to 129, glucose 413 on CMP, creatinine 1.35, beta hydroxybutyrate minimally elevated at 0.58, pH 7.3.  Patient given fluids and insulin  and is pending reevaluation by oncoming provider at time of signout.  Please see provider signout for continuation of workup.   Additional history obtained:  -External records from outside source obtained and reviewed including: Chart review including previous notes, labs, imaging, consultation notes   Lab Tests: -I ordered, reviewed, and interpreted labs.   The pertinent results include:   Labs Reviewed  COMPREHENSIVE METABOLIC PANEL WITH GFR  CBC WITH DIFFERENTIAL/PLATELET  BLOOD GAS, VENOUS  BETA-HYDROXYBUTYRIC ACID  URINALYSIS, ROUTINE W REFLEX MICROSCOPIC  CBG MONITORING, ED      Medicines ordered and prescription drug management: Meds ordered this encounter  Medications   lactated ringers  bolus 1,000 mL    -I have reviewed the patients home medicines and have made adjustments as needed  Critical interventions none    Cardiac Monitoring: The patient was maintained on a cardiac monitor.  I personally viewed and interpreted the cardiac monitored which showed an underlying rhythm of: NSR  Social Determinants of Health:  Factors impacting patients care include: none   Reevaluation: After the interventions noted above, I reevaluated the patient and found that they have :improved  Co morbidities that complicate the patient evaluation  Past Medical History:  Diagnosis Date   Diabetes mellitus without complication (HCC)    DKA  (diabetic ketoacidosis) (HCC) 05/08/2022   HTN (hypertension) 05/08/2022   Hyperlipidemia    Hyperosmolar hyperglycemic state (HHS) (HCC) 05/08/2022   Hypertension    Hypoglycemia 07/02/2019   Pseudohyponatremia 05/08/2022      Dispostion: I considered admission for this patient, and disposition pending reevaluation by oncoming provider.  Please see provider signout for continuation of workup     Final Clinical Impression(s) / ED Diagnoses Final diagnoses:  None     @PCDICTATION @    Albertina Dixon, MD 03/03/24 [phone]

## 2024-03-03 NOTE — ED Notes (Signed)
CBG 229 

## 2024-03-03 NOTE — ED Notes (Signed)
 Pt was given a ham sandwich and diet ginger ale.

## 2024-03-03 NOTE — ED Triage Notes (Signed)
 Patient arrived with complaints of weakness and lower abdominal pain. Hx of diabetes, blood sugar reading 377 stating he took 30 units of insulin  3 hours ago.

## 2024-03-03 NOTE — Discharge Instructions (Addendum)
 Take your insulin  as discussed.  Make sure that you are taking your blood pressure medication.  Make an appointment to follow-up with a primary care doctor.  Return to the emergency room if you have any worsening symptoms.

## 2024-03-08 ENCOUNTER — Emergency Department (HOSPITAL_COMMUNITY): Admission: EM | Admit: 2024-03-08 | Discharge: 2024-03-08 | Disposition: A | Discharge status: Home / Self Care

## 2024-03-08 ENCOUNTER — Other Ambulatory Visit: Payer: Self-pay

## 2024-03-08 ENCOUNTER — Encounter (HOSPITAL_COMMUNITY): Payer: Self-pay

## 2024-03-08 DIAGNOSIS — I10 Essential (primary) hypertension: Secondary | ICD-10-CM | POA: Diagnosis not present

## 2024-03-08 DIAGNOSIS — Z794 Long term (current) use of insulin: Secondary | ICD-10-CM | POA: Insufficient documentation

## 2024-03-08 DIAGNOSIS — R739 Hyperglycemia, unspecified: Secondary | ICD-10-CM

## 2024-03-08 DIAGNOSIS — E1165 Type 2 diabetes mellitus with hyperglycemia: Secondary | ICD-10-CM | POA: Diagnosis not present

## 2024-03-08 DIAGNOSIS — Z720 Tobacco use: Secondary | ICD-10-CM | POA: Diagnosis not present

## 2024-03-08 DIAGNOSIS — Z79899 Other long term (current) drug therapy: Secondary | ICD-10-CM | POA: Diagnosis not present

## 2024-03-08 DIAGNOSIS — R112 Nausea with vomiting, unspecified: Secondary | ICD-10-CM | POA: Diagnosis present

## 2024-03-08 DIAGNOSIS — R110 Nausea: Secondary | ICD-10-CM | POA: Diagnosis not present

## 2024-03-08 LAB — OSMOLALITY: Osmolality: 307 mosm/kg — ABNORMAL HIGH (ref 275–295)

## 2024-03-08 LAB — URINALYSIS, ROUTINE W REFLEX MICROSCOPIC
Bacteria, UA: NONE SEEN
Bilirubin Urine: NEGATIVE
Glucose, UA: >=500 mg/dL — AB
Hgb urine dipstick: NEGATIVE
Ketones, ur: 5 mg/dL — AB
Leukocytes,Ua: NEGATIVE
Nitrite: NEGATIVE
Protein, ur: NEGATIVE mg/dL
Specific Gravity, Urine: 1.027 (ref 1.005–1.030)
pH: 5 (ref 5.0–8.0)

## 2024-03-08 LAB — CBC WITH DIFFERENTIAL/PLATELET
Abs Immature Granulocytes: 0.01 K/uL (ref 0.00–0.07)
Basophils Absolute: 0.1 K/uL (ref 0.0–0.1)
Basophils Relative: 1 %
Eosinophils Absolute: 0.1 K/uL (ref 0.0–0.5)
Eosinophils Relative: 2 %
HCT: 49.5 % (ref 39.0–52.0)
Hemoglobin: 17.3 g/dL — ABNORMAL HIGH (ref 13.0–17.0)
Immature Granulocytes: 0 %
Lymphocytes Relative: 43 %
Lymphs Abs: 2.3 K/uL (ref 0.7–4.0)
MCH: 28.3 pg (ref 26.0–34.0)
MCHC: 34.9 g/dL (ref 30.0–36.0)
MCV: 80.9 fL (ref 80.0–100.0)
Monocytes Absolute: 0.5 K/uL (ref 0.1–1.0)
Monocytes Relative: 8 %
Neutro Abs: 2.5 K/uL (ref 1.7–7.7)
Neutrophils Relative %: 46 %
Platelets: 223 K/uL (ref 150–400)
RBC: 6.12 MIL/uL — ABNORMAL HIGH (ref 4.22–5.81)
RDW: 12.3 % (ref 11.5–15.5)
WBC: 5.5 K/uL (ref 4.0–10.5)
nRBC: 0 % (ref 0.0–0.2)

## 2024-03-08 LAB — BETA-HYDROXYBUTYRIC ACID: Beta-Hydroxybutyric Acid: 1.05 mmol/L — ABNORMAL HIGH (ref 0.05–0.27)

## 2024-03-08 LAB — CBG MONITORING, ED
Glucose-Capillary: 564 mg/dL (ref 70–99)
Glucose-Capillary: 483 mg/dL — ABNORMAL HIGH (ref 70–99)
Glucose-Capillary: 334 mg/dL — ABNORMAL HIGH (ref 70–99)

## 2024-03-08 LAB — BLOOD GAS, VENOUS
Acid-base deficit: 1.4 mmol/L (ref 0.0–2.0)
Bicarbonate: 26 mmol/L (ref 20.0–28.0)
O2 Saturation: 68.1 %
Patient temperature: 37
pCO2, Ven: 54 mmHg (ref 44–60)
pH, Ven: 7.29 (ref 7.25–7.43)
pO2, Ven: 39 mmHg (ref 32–45)

## 2024-03-08 LAB — BASIC METABOLIC PANEL WITH GFR
Anion gap: 12 (ref 5–15)
BUN: 20 mg/dL (ref 6–20)
CO2: 23 mmol/L (ref 22–32)
Calcium: 9.3 mg/dL (ref 8.9–10.3)
Chloride: 91 mmol/L — ABNORMAL LOW (ref 98–111)
Creatinine, Ser: 1.34 mg/dL — ABNORMAL HIGH (ref 0.61–1.24)
GFR, Estimated: >60 mL/min (ref >60)
Glucose, Bld: 532 mg/dL (ref 70–99)
Potassium: 4.8 mmol/L (ref 3.5–5.1)
Sodium: 126 mmol/L — ABNORMAL LOW (ref 135–145)

## 2024-03-08 MED ORDER — INSULIN ASPART 100 UNIT/ML IJ SOLN
10.0000 [IU] | Freq: Once | INTRAMUSCULAR | Status: AC
  Administered 2024-03-08 (×2): 10 [IU] via SUBCUTANEOUS
  Filled 2024-03-08: qty 0.1

## 2024-03-08 MED ORDER — LACTATED RINGERS IV BOLUS
1000.0000 mL | Freq: Once | INTRAVENOUS | Status: AC
  Administered 2024-03-08 (×2): 1000 mL via INTRAVENOUS

## 2024-03-08 MED ORDER — ONDANSETRON HCL 4 MG/2ML IJ SOLN
4.0000 mg | Freq: Once | INTRAMUSCULAR | Status: AC
  Administered 2024-03-08 (×2): 4 mg via INTRAVENOUS
  Filled 2024-03-08: qty 2

## 2024-03-08 MED ORDER — INSULIN GLARGINE-YFGN 100 UNIT/ML ~~LOC~~ SOLN
40.0000 [IU] | Freq: Once | SUBCUTANEOUS | Status: AC
  Administered 2024-03-08 (×2): 40 [IU] via SUBCUTANEOUS
  Filled 2024-03-08: qty 0.4

## 2024-03-08 NOTE — ED Provider Notes (Signed)
 [place] EMERGENCY DEPARTMENT AT [facility] Provider Note   CSN: [id number] Arrival date & time: 03/08/24  9295     Patient presents with: Hyperglycemia   [name] [name] is a 38 y.o. male.   This is a 38 year old male with diabetes presenting emergency department with nausea and episode of vomiting this morning.  He has been without his insulin  for the past 4 to 5 days.  Reports feeling dehydrated, thirsty and urinating a lot.  This morning reported he drank soda, and immediately felt nauseated and vomited.  No abdominal pain.  Feels similar to his last time he was admitted for elevated blood sugar   Hyperglycemia      Prior to Admission medications   Medication Sig Start Date End Date Taking? Authorizing Provider  amLODipine  (NORVASC ) 5 MG tablet Take 1 tablet (5 mg total) by mouth daily. Patient not taking: Reported on 02/26/2024 01/03/24   Vicky Charleston, PA-C  insulin  lispro (HUMALOG ) 100 UNIT/ML KwikPen Inject 8 Units into the skin 3 (three) times daily with meals. Only take if eating a meal AND Blood Glucose (BG) is 80 or higher. 02/28/24   Gonfa, Taye T, MD  Insulin  Pen Needle (PEN NEEDLES) 31G X 5 MM MISC Use  three times daily 09/17/23   Neldon Hamp RAMAN, PA  Lancet Device MISC Use 3 (three) times daily. 09/17/23   Neldon Hamp RAMAN, PA  Lancets MISC Use to check blood sugar three times daily Patient not taking: Reported on 02/26/2024 08/26/23   Jonel Lonni SQUIBB, MD  LANTUS  SOLOSTAR 100 UNIT/ML Solostar Pen Inject 40 Units into the skin at bedtime. 02/28/24   Gonfa, Taye T, MD    Allergies: Patient has no known allergies.    Review of Systems  Updated Vital Signs BP (!) 145/112   Pulse 96   Temp 98.2 F (36.8 C) (Oral)   Resp 18   Ht 5' 11 (1.803 m)   Wt 81.6 kg   SpO2 100%   BMI 25.10 kg/m   Physical Exam Vitals and nursing note reviewed.  Constitutional:      General: He is not in acute distress.    Appearance: He is not toxic-appearing.   HENT:     Head: Normocephalic.     Nose: Nose normal.     Mouth/Throat:     Mouth: Mucous membranes are moist.  Eyes:     Conjunctiva/sclera: Conjunctivae normal.  Cardiovascular:     Rate and Rhythm: Normal rate and regular rhythm.  Pulmonary:     Effort: Pulmonary effort is normal.     Breath sounds: Normal breath sounds.  Abdominal:     General: Abdomen is flat. There is no distension.     Tenderness: There is no abdominal tenderness. There is no guarding or rebound.  Musculoskeletal:     Right lower leg: No edema.     Left lower leg: No edema.  Skin:    General: Skin is warm.     Capillary Refill: Capillary refill takes less than 2 seconds.  Neurological:     Mental Status: He is alert and oriented to person, place, and time.  Psychiatric:        Mood and Affect: Mood normal.        Behavior: Behavior normal.     (all labs ordered are listed, but only abnormal results are displayed) Labs Reviewed  CBG MONITORING, ED - Abnormal; Notable for the following components:      Result Value  Glucose-Capillary 564 (*)    All other components within normal limits  OSMOLALITY  CBC WITH DIFFERENTIAL/PLATELET  URINALYSIS, ROUTINE W REFLEX MICROSCOPIC  BLOOD GAS, VENOUS  BASIC METABOLIC PANEL WITH GFR  BETA-HYDROXYBUTYRIC ACID  CBG MONITORING, ED    EKG: None  Radiology: No results found.   Procedures   Medications Ordered in the ED - No data to display  Clinical Course as of 03/08/24 1206  Mon Mar 08, 2024  0719 Per chart review admitted on 7/31 for HHS/hyperglycemia [TY]  0830 pH, Ven: 7.29 Borderline acidosis [TY]  0837 Basic metabolic panel(!!) Pseudohyponatremia.  Hyperglycemic, but bicarb and anion gap not suggestive of DKA. [TY]  0837 CBC with Differential (PNL)(!) No leukocytosis to suggest infectious process [TY]  1135 Glucose-Capillary(!): 334 [TY]  1136 Blood sugar has improved after IV fluids and home insulin .  He also notes improvement as well.   Discussed importance of taking his insulin .  He reports that he does not have prescription, however per my chart review appears he is to refills on all of his insulin  medications.  Will discharge in stable condition.  Return precautions given. [TY]    Clinical Course User Index [TY] Neysa Caron PARAS, DO                                 Medical Decision Making 38 year old male presenting emergency department with concern for hyperglycemia.  He is afebrile nontachycardic, slightly hypertensive.  He is alert and oriented x 3 with benign abdominal exam.  Per chart review admitted on 7/31 for HHS/hyperglycemia.  Concern for the same given he has not had his insulin  for the past several days.  Will give IV fluids, Zofran  while awaiting lab workup.  See ED course for further MDM disposition  Amount and/or Complexity of Data Reviewed External Data Reviewed:     Details: PMH per chart review: poorly controlled IDDM-2, HTN and tobacco use disorder Labs: ordered. Decision-making details documented in ED Course. Radiology:     Details: Considered CT abdomen given his complaint of nausea vomiting, however I suspect likely secondary to his underlying hyperglycemia as his vital signs are reassuring and he has a benign abdominal exam. ECG/medicine tests: ordered and independent interpretation performed. Decision-making details documented in ED Course.  Risk Prescription drug management. Decision regarding hospitalization. Diagnosis or treatment significantly limited by social determinants of health. Risk Details: Poor health literacy.      Final diagnoses:  None    ED Discharge Orders     None          Neysa Caron PARAS, DO 03/08/24 1549

## 2024-03-08 NOTE — ED Triage Notes (Signed)
 Pt BIB EMS with reports of hyperglycemia x 4 days. Pt reports not being able to get his insulin  from the pharmacy. Pt has nausea and abdominal pain.

## 2024-03-08 NOTE — Discharge Instructions (Signed)
 You have refills for prescriptions for your diabetes medication at [facility] [address].  Please take your diabetes medication as prescribed.  Return if develop fevers, chills, chest pain, shortness of breath, severe abdominal pain, uncontrolled blood sugars that are not able to be read on your glucose meter or you develop any new or worsening symptoms that are concerning to you

## 2024-03-09 DIAGNOSIS — F339 Major depressive disorder, recurrent, unspecified: Secondary | ICD-10-CM | POA: Diagnosis not present

## 2024-03-11 DIAGNOSIS — F339 Major depressive disorder, recurrent, unspecified: Secondary | ICD-10-CM | POA: Diagnosis not present

## 2024-03-13 ENCOUNTER — Emergency Department (HOSPITAL_COMMUNITY)
Admission: EM | Admit: 2024-03-13 | Discharge: 2024-03-14 | Disposition: A | Discharge status: Home / Self Care | Attending: Emergency Medicine | Admitting: Emergency Medicine

## 2024-03-13 DIAGNOSIS — R55 Syncope and collapse: Secondary | ICD-10-CM | POA: Diagnosis not present

## 2024-03-13 DIAGNOSIS — E11649 Type 2 diabetes mellitus with hypoglycemia without coma: Secondary | ICD-10-CM | POA: Diagnosis not present

## 2024-03-13 DIAGNOSIS — I10 Essential (primary) hypertension: Secondary | ICD-10-CM | POA: Insufficient documentation

## 2024-03-13 DIAGNOSIS — Z794 Long term (current) use of insulin: Secondary | ICD-10-CM | POA: Insufficient documentation

## 2024-03-13 DIAGNOSIS — Z79899 Other long term (current) drug therapy: Secondary | ICD-10-CM | POA: Diagnosis not present

## 2024-03-13 DIAGNOSIS — E876 Hypokalemia: Secondary | ICD-10-CM | POA: Insufficient documentation

## 2024-03-13 DIAGNOSIS — E10649 Type 1 diabetes mellitus with hypoglycemia without coma: Secondary | ICD-10-CM | POA: Diagnosis not present

## 2024-03-13 DIAGNOSIS — E162 Hypoglycemia, unspecified: Secondary | ICD-10-CM

## 2024-03-13 LAB — CBG MONITORING, ED: Glucose-Capillary: 131 mg/dL — ABNORMAL HIGH (ref 70–99)

## 2024-03-13 NOTE — ED Triage Notes (Signed)
 Pt arrives via [facility] c/o near syncope episode and hypoglycemia. Pt has T1DM non-compliant. EMS states that on arrival his CBG was 86. Pt was given oral glucose and was bought a taco by EMS and reports that now he feels better. Pt hypertensive, but also non-compliant with HTN medications. Pt also c/o prostate pain which he states is pre-existing.

## 2024-03-14 LAB — CBC WITH DIFFERENTIAL/PLATELET
Abs Immature Granulocytes: 0.01 K/uL (ref 0.00–0.07)
Basophils Absolute: 0.1 K/uL (ref 0.0–0.1)
Basophils Relative: 1 %
Eosinophils Absolute: 0.1 K/uL (ref 0.0–0.5)
Eosinophils Relative: 2 %
HCT: 46.6 % (ref 39.0–52.0)
Hemoglobin: 15.4 g/dL (ref 13.0–17.0)
Immature Granulocytes: 0 %
Lymphocytes Relative: 37 %
Lymphs Abs: 2.6 K/uL (ref 0.7–4.0)
MCH: 27.6 pg (ref 26.0–34.0)
MCHC: 33 g/dL (ref 30.0–36.0)
MCV: 83.5 fL (ref 80.0–100.0)
Monocytes Absolute: 0.8 K/uL (ref 0.1–1.0)
Monocytes Relative: 11 %
Neutro Abs: 3.4 K/uL (ref 1.7–7.7)
Neutrophils Relative %: 49 %
Platelets: 219 K/uL (ref 150–400)
RBC: 5.58 MIL/uL (ref 4.22–5.81)
RDW: 12.1 % (ref 11.5–15.5)
WBC: 7 K/uL (ref 4.0–10.5)
nRBC: 0 % (ref 0.0–0.2)

## 2024-03-14 LAB — COMPREHENSIVE METABOLIC PANEL WITH GFR
ALT: 19 U/L (ref 0–44)
AST: 18 U/L (ref 15–41)
Albumin: 3.8 g/dL (ref 3.5–5.0)
Alkaline Phosphatase: 85 U/L (ref 38–126)
Anion gap: 12 (ref 5–15)
BUN: 18 mg/dL (ref 6–20)
CO2: 24 mmol/L (ref 22–32)
Calcium: 10.3 mg/dL (ref 8.9–10.3)
Chloride: 102 mmol/L (ref 98–111)
Creatinine, Ser: 1.36 mg/dL — ABNORMAL HIGH (ref 0.61–1.24)
GFR, Estimated: >60 mL/min (ref >60)
Glucose, Bld: 107 mg/dL — ABNORMAL HIGH (ref 70–99)
Potassium: 2.9 mmol/L — ABNORMAL LOW (ref 3.5–5.1)
Sodium: 138 mmol/L (ref 135–145)
Total Bilirubin: 0.6 mg/dL (ref 0.0–1.2)
Total Protein: 7.5 g/dL (ref 6.5–8.1)

## 2024-03-14 LAB — CBG MONITORING, ED
Glucose-Capillary: 108 mg/dL — ABNORMAL HIGH (ref 70–99)
Glucose-Capillary: 83 mg/dL (ref 70–99)

## 2024-03-14 LAB — ETHANOL: Alcohol, Ethyl (B): <15 mg/dL (ref <15)

## 2024-03-14 MED ORDER — POTASSIUM CHLORIDE CRYS ER 20 MEQ PO TBCR
40.0000 meq | EXTENDED_RELEASE_TABLET | Freq: Once | ORAL | Status: AC
  Administered 2024-03-14: 40 meq via ORAL
  Filled 2024-03-14: qty 2

## 2024-03-14 NOTE — Discharge Instructions (Addendum)
 You were seen in the ER today after your episode of low blood sugar.  Fortunately this was improved prior to arrival to the ER.  Your labs here are reassuring.  You may follow-up with the clinic listed below for assistance with your medications.  Return to the ER with any severe symptoms.

## 2024-03-14 NOTE — ED Notes (Signed)
 Pt given 2 cokes and 2 turkey sandwiches prior to discharge

## 2024-03-14 NOTE — ED Provider Notes (Signed)
 [place] EMERGENCY DEPARTMENT AT [facility] Provider Note   CSN: [id number] Arrival date & time: 03/13/24  2305     Patient presents with: Near Syncope   [name] [name] is a 38 y.o. male with history of homelessness, type 2 diabetes that is insulin -dependent polysubstance abuse who presents via EMS after they were called out for near syncopal episode.  CBG was 86 at time of their arrival, patient given oral glucose and some food and states that he is feeling better.  No acute complaints at time of my arrival to bedside the patient is quite somnolent, bloodshot eyes.  Patient is currently living outdoors and while he states he has his insulin  and uses it as prescribed he does not have a way to keep it cool.   HPI     Prior to Admission medications   Medication Sig Start Date End Date Taking? Authorizing Provider  amLODipine  (NORVASC ) 5 MG tablet Take 1 tablet (5 mg total) by mouth daily. Patient not taking: Reported on 02/26/2024 01/03/24   Vicky Charleston, PA-C  insulin  lispro (HUMALOG ) 100 UNIT/ML KwikPen Inject 8 Units into the skin 3 (three) times daily with meals. Only take if eating a meal AND Blood Glucose (BG) is 80 or higher. 02/28/24   Gonfa, Taye T, MD  Insulin  Pen Needle (PEN NEEDLES) 31G X 5 MM MISC Use  three times daily 09/17/23   Neldon Hamp RAMAN, PA  Lancet Device MISC Use 3 (three) times daily. 09/17/23   Neldon Hamp RAMAN, PA  Lancets MISC Use to check blood sugar three times daily Patient not taking: Reported on 02/26/2024 08/26/23   Jonel Lonni SQUIBB, MD  LANTUS  SOLOSTAR 100 UNIT/ML Solostar Pen Inject 40 Units into the skin at bedtime. 02/28/24   Gonfa, Taye T, MD    Allergies: Patient has no known allergies.    Review of Systems  Constitutional: Negative.   HENT: Negative.    Respiratory: Negative.    Cardiovascular: Negative.   Gastrointestinal: Negative.   Neurological:  Positive for light-headedness. Negative for headaches.    Updated Vital  Signs BP (!) 128/92   Pulse 77   Temp 97.9 F (36.6 C) (Oral)   Resp 15   SpO2 100%   Physical Exam Vitals and nursing note reviewed.  Constitutional:      Appearance: He is not ill-appearing or toxic-appearing.  HENT:     Head: Normocephalic and atraumatic.     Mouth/Throat:     Mouth: Mucous membranes are moist.     Pharynx: No oropharyngeal exudate or posterior oropharyngeal erythema.  Eyes:     General:        Right eye: No discharge.        Left eye: No discharge.     Conjunctiva/sclera:     Right eye: Right conjunctiva is injected.     Left eye: Left conjunctiva is injected.  Cardiovascular:     Rate and Rhythm: Normal rate and regular rhythm.     Pulses: Normal pulses.     Heart sounds: Normal heart sounds. No murmur heard. Pulmonary:     Effort: Pulmonary effort is normal. No respiratory distress.     Breath sounds: Normal breath sounds. No wheezing or rales.  Abdominal:     General: Bowel sounds are normal. There is no distension.     Palpations: Abdomen is soft.     Tenderness: There is no abdominal tenderness. There is no guarding.  Musculoskeletal:  General: No deformity.     Cervical back: Neck supple.     Right lower leg: No edema.     Left lower leg: No edema.  Skin:    General: Skin is warm and dry.     Capillary Refill: Capillary refill takes less than 2 seconds.  Neurological:     General: No focal deficit present.     Mental Status: He is alert and oriented to person, place, and time. Mental status is at baseline.  Psychiatric:        Mood and Affect: Mood normal.     (all labs ordered are listed, but only abnormal results are displayed) Labs Reviewed  COMPREHENSIVE METABOLIC PANEL WITH GFR - Abnormal; Notable for the following components:      Result Value   Potassium 2.9 (*)    Glucose, Bld 107 (*)    Creatinine, Ser 1.36 (*)    All other components within normal limits  CBG MONITORING, ED - Abnormal; Notable for the following  components:   Glucose-Capillary 131 (*)    All other components within normal limits  CBG MONITORING, ED - Abnormal; Notable for the following components:   Glucose-Capillary 108 (*)    All other components within normal limits  CBC WITH DIFFERENTIAL/PLATELET  ETHANOL  URINALYSIS, ROUTINE W REFLEX MICROSCOPIC  RAPID URINE DRUG SCREEN, HOSP PERFORMED  CBG MONITORING, ED  CBG MONITORING, ED    EKG: NSR, no STEMI.   Radiology: No results found.   Procedures   Medications Ordered in the ED  potassium chloride  SA (KLOR-CON  M) CR tablet 40 mEq (40 mEq Oral Given 03/14/24 0416)                                    Medical Decision Making 38 year old with near syncope  Mildly HTN on intake, VS otherwise normal. Cardiopulmonary exam is reassuring, abdominal exam is benign. GCS 15. Tolerating PO in the ED. Denies drug use, and did not provide a urine sample.   Amount and/or Complexity of Data Reviewed Labs: ordered.    Details: CBC unremarkable, CMP with hypokalemia of 2.9 repleted orally.  Baseline creatinine at 1.3.  CBG 131, 108, 83, provided with something to eat.  EtOH reassuring.    Risk Prescription drug management.   Clinical picture is reassuring, suspect transient hypoglycemia as primary etiology of patient's near syncopal episode.  No evidence of altered mental status in the emergency department.  EKG did not crossover, see photo and report above.  Clinical concern for emergent underlying condition that would warrant a workup and patient management is exceedingly low.  Leary  voiced understanding of his medical evaluation and treatment plan. Each of their questions answered to their expressed satisfaction.  Return precautions were given.  Patient is well-appearing, stable, and was discharged in good condition.  This chart was dictated using voice recognition software, Dragon. Despite the best efforts of this provider to proofread and correct errors, errors may still  occur which can change documentation meaning.     Final diagnoses:  Hypoglycemia  Near syncope    ED Discharge Orders     None          Bobette Pleasant JONELLE DEVONNA 03/14/24 0517    Trine Raynell Moder, MD 03/14/24 [phone]

## 2024-03-15 DIAGNOSIS — F339 Major depressive disorder, recurrent, unspecified: Secondary | ICD-10-CM | POA: Diagnosis not present

## 2024-03-18 DIAGNOSIS — F339 Major depressive disorder, recurrent, unspecified: Secondary | ICD-10-CM | POA: Diagnosis not present

## 2024-03-23 DIAGNOSIS — F339 Major depressive disorder, recurrent, unspecified: Secondary | ICD-10-CM | POA: Diagnosis not present

## 2024-03-26 DIAGNOSIS — F339 Major depressive disorder, recurrent, unspecified: Secondary | ICD-10-CM | POA: Diagnosis not present

## 2024-03-29 DIAGNOSIS — F339 Major depressive disorder, recurrent, unspecified: Secondary | ICD-10-CM | POA: Diagnosis not present

## 2024-03-30 ENCOUNTER — Telehealth: Payer: Self-pay | Admitting: *Deleted

## 2024-03-30 NOTE — Progress Notes (Signed)
 Complex Care Management Note Care Guide Note  03/30/2024 Name: [name] [name] MRN: [id number] DOB: Dec 11, 1985   Complex Care Management Outreach Attempts: An unsuccessful telephone outreach was attempted today to offer the patient information about available complex care management services.  Follow Up Plan:  Additional outreach attempts will be made to offer the patient complex care management information and services.   Encounter Outcome:  No Answer  Harlene Satterfield  [facility]  [facility], [facility] Guide  Direct Dial : [phone]  Fax [phone]

## 2024-04-01 DIAGNOSIS — F339 Major depressive disorder, recurrent, unspecified: Secondary | ICD-10-CM | POA: Diagnosis not present

## 2024-04-02 NOTE — Progress Notes (Signed)
 Complex Care Management Note Care Guide Note  04/02/2024 Name: [name] [name] MRN: [id number] DOB: 16-Sep-1985   Complex Care Management Outreach Attempts: A second unsuccessful outreach was attempted today to offer the patient with information about available complex care management services.  Follow Up Plan:  Additional outreach attempts will be made to offer the patient complex care management information and services.   Encounter Outcome:  No Answer  Harlene Satterfield  [facility]  [facility], [facility] Guide  Direct Dial : [phone]  Fax [phone]

## 2024-04-04 ENCOUNTER — Observation Stay (HOSPITAL_COMMUNITY)
Admission: EM | Admit: 2024-04-04 | Discharge: 2024-04-05 | Disposition: A | Discharge status: Home / Self Care | Attending: Family Medicine | Admitting: Family Medicine

## 2024-04-04 ENCOUNTER — Encounter (HOSPITAL_COMMUNITY): Payer: Self-pay

## 2024-04-04 DIAGNOSIS — E1100 Type 2 diabetes mellitus with hyperosmolarity without nonketotic hyperglycemic-hyperosmolar coma (NKHHC): Principal | ICD-10-CM | POA: Diagnosis present

## 2024-04-04 DIAGNOSIS — E871 Hypo-osmolality and hyponatremia: Secondary | ICD-10-CM | POA: Insufficient documentation

## 2024-04-04 DIAGNOSIS — F17210 Nicotine dependence, cigarettes, uncomplicated: Secondary | ICD-10-CM | POA: Diagnosis not present

## 2024-04-04 DIAGNOSIS — Z79899 Other long term (current) drug therapy: Secondary | ICD-10-CM | POA: Diagnosis not present

## 2024-04-04 DIAGNOSIS — R739 Hyperglycemia, unspecified: Secondary | ICD-10-CM | POA: Diagnosis not present

## 2024-04-04 DIAGNOSIS — N179 Acute kidney failure, unspecified: Secondary | ICD-10-CM | POA: Diagnosis not present

## 2024-04-04 DIAGNOSIS — E1165 Type 2 diabetes mellitus with hyperglycemia: Principal | ICD-10-CM

## 2024-04-04 DIAGNOSIS — R7309 Other abnormal glucose: Secondary | ICD-10-CM | POA: Diagnosis present

## 2024-04-04 DIAGNOSIS — R110 Nausea: Secondary | ICD-10-CM | POA: Diagnosis not present

## 2024-04-04 DIAGNOSIS — Z794 Long term (current) use of insulin: Secondary | ICD-10-CM | POA: Diagnosis not present

## 2024-04-04 DIAGNOSIS — R1111 Vomiting without nausea: Secondary | ICD-10-CM | POA: Diagnosis not present

## 2024-04-04 DIAGNOSIS — E1110 Type 2 diabetes mellitus with ketoacidosis without coma: Secondary | ICD-10-CM | POA: Insufficient documentation

## 2024-04-04 DIAGNOSIS — I10 Essential (primary) hypertension: Secondary | ICD-10-CM | POA: Insufficient documentation

## 2024-04-04 LAB — CBC WITH DIFFERENTIAL/PLATELET
Abs Immature Granulocytes: 0.02 K/uL (ref 0.00–0.07)
Basophils Absolute: 0.1 K/uL (ref 0.0–0.1)
Basophils Relative: 1 %
Eosinophils Absolute: 0.1 K/uL (ref 0.0–0.5)
Eosinophils Relative: 1 %
HCT: 48.5 % (ref 39.0–52.0)
Hemoglobin: 15.8 g/dL (ref 13.0–17.0)
Immature Granulocytes: 0 %
Lymphocytes Relative: 29 %
Lymphs Abs: 1.7 K/uL (ref 0.7–4.0)
MCH: 26.9 pg (ref 26.0–34.0)
MCHC: 32.6 g/dL (ref 30.0–36.0)
MCV: 82.5 fL (ref 80.0–100.0)
Monocytes Absolute: 0.5 K/uL (ref 0.1–1.0)
Monocytes Relative: 8 %
Neutro Abs: 3.5 K/uL (ref 1.7–7.7)
Neutrophils Relative %: 61 %
Platelets: 234 K/uL (ref 150–400)
RBC: 5.88 MIL/uL — ABNORMAL HIGH (ref 4.22–5.81)
RDW: 12.2 % (ref 11.5–15.5)
WBC: 5.8 K/uL (ref 4.0–10.5)
nRBC: 0 % (ref 0.0–0.2)

## 2024-04-04 LAB — BASIC METABOLIC PANEL WITH GFR
Anion gap: 13 (ref 5–15)
BUN: 20 mg/dL (ref 6–20)
CO2: 21 mmol/L — ABNORMAL LOW (ref 22–32)
Calcium: 10.3 mg/dL (ref 8.9–10.3)
Chloride: 94 mmol/L — ABNORMAL LOW (ref 98–111)
Creatinine, Ser: 1.22 mg/dL (ref 0.61–1.24)
GFR, Estimated: >60 mL/min (ref >60)
Glucose, Bld: 462 mg/dL — ABNORMAL HIGH (ref 70–99)
Potassium: 4 mmol/L (ref 3.5–5.1)
Sodium: 129 mmol/L — ABNORMAL LOW (ref 135–145)

## 2024-04-04 LAB — RESP PANEL BY RT-PCR (RSV, FLU A&B, COVID)  RVPGX2
Influenza A by PCR: NEGATIVE
Influenza B by PCR: NEGATIVE
Resp Syncytial Virus by PCR: NEGATIVE
SARS Coronavirus 2 by RT PCR: NEGATIVE

## 2024-04-04 LAB — COMPREHENSIVE METABOLIC PANEL WITH GFR
ALT: 15 U/L (ref 0–44)
AST: 24 U/L (ref 15–41)
Albumin: 3.7 g/dL (ref 3.5–5.0)
Alkaline Phosphatase: 117 U/L (ref 38–126)
Anion gap: 15 (ref 5–15)
BUN: 22 mg/dL — ABNORMAL HIGH (ref 6–20)
CO2: 20 mmol/L — ABNORMAL LOW (ref 22–32)
Calcium: 10.2 mg/dL (ref 8.9–10.3)
Chloride: 87 mmol/L — ABNORMAL LOW (ref 98–111)
Creatinine, Ser: 1.41 mg/dL — ABNORMAL HIGH (ref 0.61–1.24)
GFR, Estimated: >60 mL/min (ref >60)
Glucose, Bld: 885 mg/dL (ref 70–99)
Potassium: 5 mmol/L (ref 3.5–5.1)
Sodium: 121 mmol/L — ABNORMAL LOW (ref 135–145)
Total Bilirubin: 0.5 mg/dL (ref 0.0–1.2)
Total Protein: 6.9 g/dL (ref 6.5–8.1)

## 2024-04-04 LAB — BETA-HYDROXYBUTYRIC ACID: Beta-Hydroxybutyric Acid: 0.17 mmol/L (ref 0.05–0.27)

## 2024-04-04 LAB — URINALYSIS, ROUTINE W REFLEX MICROSCOPIC
Bacteria, UA: NONE SEEN
Bilirubin Urine: NEGATIVE
Glucose, UA: >=500 mg/dL — AB
Hgb urine dipstick: NEGATIVE
Ketones, ur: NEGATIVE mg/dL
Leukocytes,Ua: NEGATIVE
Nitrite: NEGATIVE
Protein, ur: NEGATIVE mg/dL
Specific Gravity, Urine: 1.026 (ref 1.005–1.030)
pH: 5 (ref 5.0–8.0)

## 2024-04-04 LAB — GLUCOSE, CAPILLARY
Glucose-Capillary: 509 mg/dL (ref 70–99)
Glucose-Capillary: >600 mg/dL (ref 70–99)
Glucose-Capillary: 342 mg/dL — ABNORMAL HIGH (ref 70–99)

## 2024-04-04 LAB — BLOOD GAS, VENOUS
Acid-Base Excess: 1.1 mmol/L (ref 0.0–2.0)
Bicarbonate: 27.2 mmol/L (ref 20.0–28.0)
O2 Saturation: 87.2 %
Patient temperature: 37
pCO2, Ven: 47 mmHg (ref 44–60)
pH, Ven: 7.37 (ref 7.25–7.43)
pO2, Ven: 54 mmHg — ABNORMAL HIGH (ref 32–45)

## 2024-04-04 LAB — CBG MONITORING, ED
Glucose-Capillary: >600 mg/dL (ref 70–99)
Glucose-Capillary: >600 mg/dL (ref 70–99)
Glucose-Capillary: >600 mg/dL (ref 70–99)

## 2024-04-04 LAB — MRSA NEXT GEN BY PCR, NASAL: MRSA by PCR Next Gen: NOT DETECTED

## 2024-04-04 MED ORDER — AMLODIPINE BESYLATE 5 MG PO TABS: 5.0000 mg | ORAL_TABLET | Freq: Every day | ORAL | Status: DC

## 2024-04-04 MED ORDER — DEXTROSE 50 % IV SOLN: 0.0000 mL | INTRAVENOUS | Status: DC | PRN

## 2024-04-04 MED ORDER — LACTATED RINGERS IV SOLN: INTRAVENOUS | Status: DC

## 2024-04-04 MED ORDER — ONDANSETRON HCL 4 MG/2ML IJ SOLN
4.0000 mg | Freq: Once | INTRAMUSCULAR | Status: AC
  Administered 2024-04-04: 4 mg via INTRAVENOUS
  Filled 2024-04-04: qty 2

## 2024-04-04 MED ORDER — INSULIN REGULAR(HUMAN) IN NACL 100-0.9 UT/100ML-% IV SOLN
INTRAVENOUS | Status: DC
  Filled 2024-04-04: qty 100

## 2024-04-04 MED ORDER — ORAL CARE MOUTH RINSE
15.0000 mL | OROMUCOSAL | Status: DC | PRN
Start: 2024-04-04 — End: 2024-04-05

## 2024-04-04 MED ORDER — DEXTROSE IN LACTATED RINGERS 5 % IV SOLN: INTRAVENOUS | Status: DC

## 2024-04-04 MED ORDER — LACTATED RINGERS IV BOLUS
2000.0000 mL | Freq: Once | INTRAVENOUS | Status: AC
  Administered 2024-04-04: 2000 mL via INTRAVENOUS

## 2024-04-04 MED ORDER — ENOXAPARIN SODIUM 40 MG/0.4ML IJ SOSY: 40.0000 mg | PREFILLED_SYRINGE | INTRAMUSCULAR | Status: DC

## 2024-04-04 MED ORDER — INSULIN REGULAR(HUMAN) IN NACL 100-0.9 UT/100ML-% IV SOLN
INTRAVENOUS | Status: DC
  Administered 2024-04-04: 15 [IU]/h via INTRAVENOUS
  Filled 2024-04-04: qty 100

## 2024-04-04 MED ORDER — CHLORHEXIDINE GLUCONATE CLOTH 2 % EX PADS: 6.0000 | MEDICATED_PAD | Freq: Every day | CUTANEOUS | Status: DC

## 2024-04-04 MED ORDER — INSULIN ASPART 100 UNIT/ML IJ SOLN
20.0000 [IU] | Freq: Once | INTRAMUSCULAR | Status: DC
Start: 2024-04-04 — End: 2024-04-04
  Filled 2024-04-04: qty 0.2

## 2024-04-04 NOTE — ED Triage Notes (Signed)
 Pt BIBA by EMS for hyperglycemia.  Pt reports not having insulin  x3 days.  Endorses nausea, vomiting and abdominal pain  170/108 HR 120 Cbg 355

## 2024-04-04 NOTE — Progress Notes (Signed)
 This RN noted pt to be hypertensive upon arriving to ICU/SD unit. NP, Lynwood Kipper notified with no new orders. Pt still hypertensive as of 2330, NP Lynwood Kipper notified once again with no new orders. Pt resting at this time, doesn't appear to be in obvious distress, breathing is regular, symmetrical, and unlabored.
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
## 2024-04-05 ENCOUNTER — Other Ambulatory Visit (HOSPITAL_COMMUNITY): Payer: Self-pay

## 2024-04-05 DIAGNOSIS — E1100 Type 2 diabetes mellitus with hyperosmolarity without nonketotic hyperglycemic-hyperosmolar coma (NKHHC): Principal | ICD-10-CM

## 2024-04-05 LAB — BASIC METABOLIC PANEL WITH GFR
Anion gap: 13 (ref 5–15)
Anion gap: 14 (ref 5–15)
Anion gap: 11 (ref 5–15)
Anion gap: 11 (ref 5–15)
BUN: 21 mg/dL — ABNORMAL HIGH (ref 6–20)
BUN: 18 mg/dL (ref 6–20)
BUN: 17 mg/dL (ref 6–20)
BUN: 19 mg/dL (ref 6–20)
CO2: 24 mmol/L (ref 22–32)
CO2: 23 mmol/L (ref 22–32)
CO2: 23 mmol/L (ref 22–32)
CO2: 23 mmol/L (ref 22–32)
Calcium: 10.3 mg/dL (ref 8.9–10.3)
Calcium: 10.1 mg/dL (ref 8.9–10.3)
Calcium: 9.4 mg/dL (ref 8.9–10.3)
Calcium: 9.5 mg/dL (ref 8.9–10.3)
Chloride: 98 mmol/L (ref 98–111)
Chloride: 98 mmol/L (ref 98–111)
Chloride: 96 mmol/L — ABNORMAL LOW (ref 98–111)
Chloride: 94 mmol/L — ABNORMAL LOW (ref 98–111)
Creatinine, Ser: 1.1 mg/dL (ref 0.61–1.24)
Creatinine, Ser: 1.05 mg/dL (ref 0.61–1.24)
Creatinine, Ser: 1.02 mg/dL (ref 0.61–1.24)
Creatinine, Ser: 1.1 mg/dL (ref 0.61–1.24)
GFR, Estimated: >60 mL/min (ref >60)
GFR, Estimated: >60 mL/min (ref >60)
GFR, Estimated: >60 mL/min (ref >60)
GFR, Estimated: >60 mL/min (ref >60)
Glucose, Bld: 147 mg/dL — ABNORMAL HIGH (ref 70–99)
Glucose, Bld: 195 mg/dL — ABNORMAL HIGH (ref 70–99)
Glucose, Bld: 348 mg/dL — ABNORMAL HIGH (ref 70–99)
Glucose, Bld: 402 mg/dL — ABNORMAL HIGH (ref 70–99)
Potassium: 4.1 mmol/L (ref 3.5–5.1)
Potassium: 3.9 mmol/L (ref 3.5–5.1)
Potassium: 4.1 mmol/L (ref 3.5–5.1)
Potassium: 4.2 mmol/L (ref 3.5–5.1)
Sodium: 134 mmol/L — ABNORMAL LOW (ref 135–145)
Sodium: 135 mmol/L (ref 135–145)
Sodium: 131 mmol/L — ABNORMAL LOW (ref 135–145)
Sodium: 128 mmol/L — ABNORMAL LOW (ref 135–145)

## 2024-04-05 LAB — GLUCOSE, CAPILLARY
Glucose-Capillary: 147 mg/dL — ABNORMAL HIGH (ref 70–99)
Glucose-Capillary: 150 mg/dL — ABNORMAL HIGH (ref 70–99)
Glucose-Capillary: 168 mg/dL — ABNORMAL HIGH (ref 70–99)
Glucose-Capillary: 173 mg/dL — ABNORMAL HIGH (ref 70–99)
Glucose-Capillary: 176 mg/dL — ABNORMAL HIGH (ref 70–99)
Glucose-Capillary: 316 mg/dL — ABNORMAL HIGH (ref 70–99)
Glucose-Capillary: 337 mg/dL — ABNORMAL HIGH (ref 70–99)

## 2024-04-05 LAB — OSMOLALITY: Osmolality: 301 mosm/kg — ABNORMAL HIGH (ref 275–295)

## 2024-04-05 MED ORDER — AMLODIPINE BESYLATE 10 MG PO TABS
10.0000 mg | ORAL_TABLET | Freq: Every day | ORAL | 1 refills | Status: DC
  Filled 2024-04-05: qty 30, 30d supply, fill #0

## 2024-04-05 MED ORDER — INSULIN GLARGINE 100 UNIT/ML ~~LOC~~ SOLN
5.0000 [IU] | Freq: Once | SUBCUTANEOUS | Status: AC
  Administered 2024-04-05: 5 [IU] via SUBCUTANEOUS
  Filled 2024-04-05 (×3): qty 0.05

## 2024-04-05 MED ORDER — LANCETS MISC
1.0000 | Freq: Three times a day (TID) | 2 refills | Status: DC
  Filled 2024-04-05: qty 100, 30d supply, fill #0

## 2024-04-05 MED ORDER — INSULIN ASPART 100 UNIT/ML IJ SOLN: 0.0000 [IU] | Freq: Every day | INTRAMUSCULAR | Status: DC

## 2024-04-05 MED ORDER — AMLODIPINE BESYLATE 5 MG PO TABS
5.0000 mg | ORAL_TABLET | Freq: Every day | ORAL | Status: AC
  Administered 2024-04-05: 5 mg via ORAL
  Filled 2024-04-05: qty 1

## 2024-04-05 MED ORDER — PEN NEEDLES 31G X 5 MM MISC
1.0000 | Freq: Three times a day (TID) | 2 refills | Status: DC
  Filled 2024-04-05: qty 100, 30d supply, fill #0

## 2024-04-05 MED ORDER — LANCET DEVICE MISC
1.0000 | Freq: Three times a day (TID) | 0 refills | Status: DC
  Filled 2024-04-05: qty 1, fill #0

## 2024-04-05 MED ORDER — AMLODIPINE BESYLATE 5 MG PO TABS
5.0000 mg | ORAL_TABLET | Freq: Every day | ORAL | Status: DC
Start: 2024-04-05 — End: 2024-04-05
  Administered 2024-04-05: 5 mg via ORAL
  Filled 2024-04-05: qty 1

## 2024-04-05 MED ORDER — LANTUS SOLOSTAR 100 UNIT/ML ~~LOC~~ SOPN
40.0000 [IU] | PEN_INJECTOR | Freq: Every day | SUBCUTANEOUS | 0 refills | Status: DC
  Filled 2024-04-05: qty 12, 30d supply, fill #0
  Filled 2024-04-05: qty 30, 75d supply, fill #0

## 2024-04-05 MED ORDER — INSULIN ASPART 100 UNIT/ML IJ SOLN
0.0000 [IU] | Freq: Three times a day (TID) | INTRAMUSCULAR | Status: DC
  Administered 2024-04-05 (×2): 11 [IU] via SUBCUTANEOUS

## 2024-04-05 MED ORDER — AMLODIPINE BESYLATE 10 MG PO TABS: 10.0000 mg | ORAL_TABLET | Freq: Every day | ORAL | Status: DC

## 2024-04-05 MED ORDER — AMLODIPINE BESYLATE 5 MG PO TABS: 5.0000 mg | ORAL_TABLET | Freq: Every day | ORAL | Status: DC

## 2024-04-05 MED ORDER — HYDRALAZINE HCL 25 MG PO TABS
25.0000 mg | ORAL_TABLET | Freq: Three times a day (TID) | ORAL | 0 refills | Status: DC
  Filled 2024-04-05: qty 90, 30d supply, fill #0

## 2024-04-05 MED ORDER — INSULIN LISPRO (1 UNIT DIAL) 100 UNIT/ML (KWIKPEN)
8.0000 [IU] | PEN_INJECTOR | Freq: Three times a day (TID) | SUBCUTANEOUS | 2 refills | Status: DC
  Filled 2024-04-05: qty 15, 63d supply, fill #0
  Filled 2024-04-05: qty 6, 25d supply, fill #0

## 2024-04-05 MED ORDER — HYDRALAZINE HCL 25 MG PO TABS
25.0000 mg | ORAL_TABLET | Freq: Three times a day (TID) | ORAL | Status: DC
  Administered 2024-04-05: 25 mg via ORAL
  Filled 2024-04-05: qty 1

## 2024-04-05 NOTE — Progress Notes (Signed)
 Progress Note: Upon chart review, it was determined that the patient is currently admitted to the hospital. Will follow up and reach out again once the patient is discharged.  Harlene Satterfield  [facility]  [facility], [facility] Guide  Direct Dial : [phone]  Fax [phone]
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
## 2024-04-05 NOTE — Progress Notes (Addendum)
   04/05/24 1217  TOC Brief Assessment  Insurance and Status Reviewed ([place] MEDICAID PREPAID HEALTH PLAN / [place] MEDICAID HEALTHY BLUE)  Home environment has been reviewed Pt states he will go and stay at his Uncle's house  Prior level of function: Independent with ADL's  Prior/Current Home Services No current home services  Social Drivers of Health Review SDOH reviewed no interventions necessary  Readmission risk has been reviewed Yes  Transition of care needs no transition of care needs at this time   Pt screened. Pt has [place] Medicaid insurance. Care Management consulted for medication assistance stating homelessness, as pt is insured there is no medication assistance available. Pt declined resources for homeless shelters, stating he will go to his Uncle's home at discharge. Pt RN given bus voucher for transportation. There are no other HH, DME, SDOH needs identified.

## 2024-04-05 NOTE — Progress Notes (Signed)
 [employer] meter and supplies provided to patient by this RN- obtained from diabetes coordinator. [employer] denied coverage fro meter and supplies through [facility], Discharge medications in a secure bag delivered to pt in room by this RN- Lantus  administered at 4 am today - Pt verbalized an understanding that he should take dose tonight at bedtime as directed.  Pt also verbalized an understanding that he should check his BP at his uncle's home. Pt confirmed verbally that he had access to a BP cuff. Pt has bus pass- this RN walked pt to main lobby

## 2024-04-05 NOTE — Plan of Care (Signed)
  Problem: Metabolic: Goal: Ability to maintain appropriate glucose levels will improve Outcome: Progressing   Problem: Nutritional: Goal: Maintenance of adequate nutrition will improve Outcome: Progressing   Problem: Skin Integrity: Goal: Risk for impaired skin integrity will decrease Outcome: Progressing   Problem: Tissue Perfusion: Goal: Adequacy of tissue perfusion will improve Outcome: Progressing   Problem: Respiratory: Goal: Will regain and/or maintain adequate ventilation Outcome: Progressing   Problem: Clinical Measurements: Goal: Ability to maintain clinical measurements within normal limits will improve Outcome: Progressing

## 2024-04-05 NOTE — Discharge Instructions (Signed)
 Advised to follow-up with primary care physician in 1 week. Advised to continue insulin  long-acting 40 units daily and sliding scale. Advise to start amlodipine  10 mg and hydralazine  25 mg 3 times daily for hypertension. Will request primary care physician to adjust his blood pressure medications as required.

## 2024-04-05 NOTE — Progress Notes (Signed)
       Overnight   NAME: [name] [name] MRN: [id number] DOB : 25-Jan-1986    Date of Service   04/05/2024   HPI/Events of Note    Notified by RN for BP.  38 yo male with Hx of IDDM, hypertension.  Admitted through ER with abdominal discomfort and elevated blood sugars. Reports homelessness, partially lives in a shelter.  Original blood pressure was 177/116. Upon intake to stepdown as patient has settled for the evening, BP is now 159/113. No obvious or stated distress.  Patient has Norvasc  ordered daily for 10 AM 04/05/2024. If sustaining high we will give medication earlier this morning   Interventions/ Plan   Norvasc  earlier if indicated by BP Continue all previous attending orders      Lynwood Kipper BSN MSNA MSN ACNPC-AG Acute Care Nurse Practitioner [facility]

## 2024-04-06 ENCOUNTER — Telehealth: Payer: Self-pay | Admitting: *Deleted

## 2024-04-06 DIAGNOSIS — F339 Major depressive disorder, recurrent, unspecified: Secondary | ICD-10-CM | POA: Diagnosis not present

## 2024-04-06 DIAGNOSIS — A419 Sepsis, unspecified organism: Secondary | ICD-10-CM

## 2024-04-06 DIAGNOSIS — Z794 Long term (current) use of insulin: Secondary | ICD-10-CM

## 2024-04-06 NOTE — Progress Notes (Signed)
 Complex Care Management Note Care Guide Note  04/06/2024 Name: [name] [name] MRN: [id number] DOB: 1985-07-31   Complex Care Management Outreach Attempts: An unsuccessful telephone outreach was attempted today to offer the patient information about available complex care management services.  Follow Up Plan:  Additional outreach attempts will be made to offer the patient complex care management information and services.   Encounter Outcome:  No Answer  Harlene Satterfield  [facility]  [facility] Guide  Direct Dial : [phone]  Fax [phone]

## 2024-04-09 DIAGNOSIS — F339 Major depressive disorder, recurrent, unspecified: Secondary | ICD-10-CM | POA: Diagnosis not present

## 2024-04-09 NOTE — Progress Notes (Signed)
 Complex Care Management Note Care Guide Note  04/09/2024 Name: [name] [name] MRN: [id number] DOB: 1985/09/30   Complex Care Management Outreach Attempts: An unsuccessful telephone outreach was attempted today to offer the patient information about available complex care management services.  Follow Up Plan:  Additional outreach attempts will be made to offer the patient complex care management information and services.   Encounter Outcome:  No Answer  Harlene Satterfield  [facility]  [facility], [facility] Guide  Direct Dial : [phone]  Fax [phone]

## 2024-04-13 DIAGNOSIS — F339 Major depressive disorder, recurrent, unspecified: Secondary | ICD-10-CM | POA: Diagnosis not present

## 2024-04-13 NOTE — Progress Notes (Signed)
 Complex Care Management Note Care Guide Note  04/13/2024 Name: [name] [name] MRN: [id number] DOB: 01-Feb-1986   Complex Care Management Outreach Attempts: A third unsuccessful outreach was attempted today to offer the patient with information about available complex care management services.  Follow Up Plan:  No further outreach attempts will be made at this time. We have been unable to contact the patient to offer or enroll patient in complex care management services.  Encounter Outcome:  No Answer  Harlene Satterfield  [facility]  [facility] Guide  Direct Dial : [phone]  Fax [phone]

## 2024-04-16 DIAGNOSIS — F339 Major depressive disorder, recurrent, unspecified: Secondary | ICD-10-CM | POA: Diagnosis not present

## 2024-04-20 DIAGNOSIS — F339 Major depressive disorder, recurrent, unspecified: Secondary | ICD-10-CM | POA: Diagnosis not present

## 2024-04-23 DIAGNOSIS — F339 Major depressive disorder, recurrent, unspecified: Secondary | ICD-10-CM | POA: Diagnosis not present

## 2024-04-28 DIAGNOSIS — F339 Major depressive disorder, recurrent, unspecified: Secondary | ICD-10-CM | POA: Diagnosis not present

## 2024-05-01 DIAGNOSIS — F339 Major depressive disorder, recurrent, unspecified: Secondary | ICD-10-CM | POA: Diagnosis not present

## 2024-05-04 ENCOUNTER — Encounter (HOSPITAL_COMMUNITY): Payer: Self-pay | Admitting: Emergency Medicine

## 2024-05-04 ENCOUNTER — Emergency Department (HOSPITAL_COMMUNITY)

## 2024-05-04 ENCOUNTER — Observation Stay (HOSPITAL_COMMUNITY)
Admission: EM | Admit: 2024-05-04 | Discharge: 2024-05-05 | Disposition: A | Discharge status: Home / Self Care | Attending: Family Medicine | Admitting: Family Medicine

## 2024-05-04 ENCOUNTER — Other Ambulatory Visit: Payer: Self-pay

## 2024-05-04 DIAGNOSIS — R109 Unspecified abdominal pain: Secondary | ICD-10-CM | POA: Diagnosis present

## 2024-05-04 DIAGNOSIS — E1165 Type 2 diabetes mellitus with hyperglycemia: Secondary | ICD-10-CM | POA: Diagnosis not present

## 2024-05-04 DIAGNOSIS — F339 Major depressive disorder, recurrent, unspecified: Secondary | ICD-10-CM | POA: Diagnosis not present

## 2024-05-04 DIAGNOSIS — Z794 Long term (current) use of insulin: Secondary | ICD-10-CM | POA: Insufficient documentation

## 2024-05-04 DIAGNOSIS — F17210 Nicotine dependence, cigarettes, uncomplicated: Secondary | ICD-10-CM | POA: Diagnosis not present

## 2024-05-04 DIAGNOSIS — I10 Essential (primary) hypertension: Secondary | ICD-10-CM | POA: Diagnosis not present

## 2024-05-04 DIAGNOSIS — R739 Hyperglycemia, unspecified: Principal | ICD-10-CM | POA: Diagnosis present

## 2024-05-04 DIAGNOSIS — Z79899 Other long term (current) drug therapy: Secondary | ICD-10-CM | POA: Insufficient documentation

## 2024-05-04 LAB — I-STAT CHEM 8, ED
BUN: 32 mg/dL — ABNORMAL HIGH (ref 6–20)
Calcium, Ion: 1.21 mmol/L (ref 1.15–1.40)
Chloride: 93 mmol/L — ABNORMAL LOW (ref 98–111)
Creatinine, Ser: 1.6 mg/dL — ABNORMAL HIGH (ref 0.61–1.24)
Glucose, Bld: >700 mg/dL (ref 70–99)
HCT: 50 % (ref 39.0–52.0)
Hemoglobin: 17 g/dL (ref 13.0–17.0)
Potassium: 4.7 mmol/L (ref 3.5–5.1)
Sodium: 127 mmol/L — ABNORMAL LOW (ref 135–145)
TCO2: 25 mmol/L (ref 22–32)

## 2024-05-04 LAB — URINALYSIS, ROUTINE W REFLEX MICROSCOPIC
Bacteria, UA: NONE SEEN
Bilirubin Urine: NEGATIVE
Glucose, UA: >=500 mg/dL — AB
Hgb urine dipstick: NEGATIVE
Ketones, ur: 5 mg/dL — AB
Nitrite: NEGATIVE
Protein, ur: NEGATIVE mg/dL
Specific Gravity, Urine: 1.022 (ref 1.005–1.030)
pH: 5 (ref 5.0–8.0)

## 2024-05-04 LAB — COMPREHENSIVE METABOLIC PANEL WITH GFR
ALT: 12 U/L (ref 0–44)
AST: 18 U/L (ref 15–41)
Albumin: 4.1 g/dL (ref 3.5–5.0)
Alkaline Phosphatase: 121 U/L (ref 38–126)
Anion gap: 13 (ref 5–15)
BUN: 27 mg/dL — ABNORMAL HIGH (ref 6–20)
CO2: 22 mmol/L (ref 22–32)
Calcium: 9.6 mg/dL (ref 8.9–10.3)
Chloride: 89 mmol/L — ABNORMAL LOW (ref 98–111)
Creatinine, Ser: 1.62 mg/dL — ABNORMAL HIGH (ref 0.61–1.24)
GFR, Estimated: 55 mL/min — ABNORMAL LOW (ref >60)
Glucose, Bld: 703 mg/dL (ref 70–99)
Potassium: 4.8 mmol/L (ref 3.5–5.1)
Sodium: 124 mmol/L — ABNORMAL LOW (ref 135–145)
Total Bilirubin: 0.6 mg/dL (ref 0.0–1.2)
Total Protein: 7.2 g/dL (ref 6.5–8.1)

## 2024-05-04 LAB — CBC
HCT: 47.3 % (ref 39.0–52.0)
Hemoglobin: 16.2 g/dL (ref 13.0–17.0)
MCH: 28.3 pg (ref 26.0–34.0)
MCHC: 34.2 g/dL (ref 30.0–36.0)
MCV: 82.5 fL (ref 80.0–100.0)
Platelets: 228 K/uL (ref 150–400)
RBC: 5.73 MIL/uL (ref 4.22–5.81)
RDW: 12.8 % (ref 11.5–15.5)
WBC: 6.1 K/uL (ref 4.0–10.5)
nRBC: 0 % (ref 0.0–0.2)

## 2024-05-04 LAB — BLOOD GAS, VENOUS
Acid-base deficit: 1.3 mmol/L (ref 0.0–2.0)
Bicarbonate: 24.8 mmol/L (ref 20.0–28.0)
O2 Saturation: 78.1 %
Patient temperature: 37
pCO2, Ven: 46 mmHg (ref 44–60)
pH, Ven: 7.34 (ref 7.25–7.43)
pO2, Ven: 42 mmHg (ref 32–45)

## 2024-05-04 MED ORDER — POTASSIUM CHLORIDE 10 MEQ/100ML IV SOLN: 10.0000 meq | INTRAVENOUS | Status: DC

## 2024-05-04 MED ORDER — ENOXAPARIN SODIUM 40 MG/0.4ML IJ SOSY
40.0000 mg | PREFILLED_SYRINGE | INTRAMUSCULAR | Status: DC
  Administered 2024-05-05: 40 mg via SUBCUTANEOUS
  Filled 2024-05-04: qty 0.4

## 2024-05-04 MED ORDER — DEXTROSE IN LACTATED RINGERS 5 % IV SOLN: INTRAVENOUS | Status: DC

## 2024-05-04 MED ORDER — INSULIN REGULAR(HUMAN) IN NACL 100-0.9 UT/100ML-% IV SOLN
INTRAVENOUS | Status: DC
  Filled 2024-05-04: qty 100

## 2024-05-04 MED ORDER — LACTATED RINGERS IV BOLUS
1000.0000 mL | Freq: Once | INTRAVENOUS | Status: AC
  Administered 2024-05-05: 1000 mL via INTRAVENOUS

## 2024-05-04 MED ORDER — LACTATED RINGERS IV SOLN: INTRAVENOUS | Status: DC

## 2024-05-04 MED ORDER — INSULIN REGULAR(HUMAN) IN NACL 100-0.9 UT/100ML-% IV SOLN
INTRAVENOUS | Status: DC
  Administered 2024-05-05: 10.5 [IU]/h via INTRAVENOUS

## 2024-05-04 MED ORDER — DEXTROSE 50 % IV SOLN: 0.0000 mL | INTRAVENOUS | Status: DC | PRN

## 2024-05-04 MED ORDER — LACTATED RINGERS IV BOLUS
20.0000 mL/kg | Freq: Once | INTRAVENOUS | Status: AC
  Administered 2024-05-05: 1896 mL via INTRAVENOUS

## 2024-05-04 MED ORDER — POTASSIUM CHLORIDE 10 MEQ/100ML IV SOLN
10.0000 meq | INTRAVENOUS | Status: AC
  Administered 2024-05-05 (×2): 10 meq via INTRAVENOUS
  Filled 2024-05-04 (×2): qty 100

## 2024-05-04 NOTE — ED Triage Notes (Signed)
 Patient BIB EMS for evaluation of hyperglycemia.  Pt was picked up from [employer].   Reports he has been out of his insulin  x 2 weeks.  Having RLQ abdominal pain, increased thirst, and increased urination.  CBG high for EMS.

## 2024-05-04 NOTE — ED Provider Notes (Signed)
 [place] EMERGENCY DEPARTMENT AT [facility] Provider Note   CSN: [id number] Arrival date & time: 05/04/24  2137     Patient presents with: Hyperglycemia   [name] [name] is a 38 y.o. male.    Hyperglycemia    Patient has history of hyperglycemia DKA hyperosmolar syndrome hypertension.  Patient presents ED with complaints of elevated blood sugar myalgias.  Patient states he has been out of his insulin  for the last couple of weeks.  He started having trouble with increased thirst and urination.  He is aching all over.  He denies any fevers or chills  Prior to Admission medications   Medication Sig Start Date End Date Taking? Authorizing Provider  ADVIL  200 MG CAPS Take 200 mg by mouth every 6 (six) hours as needed (FOR MILD PAIN OR HEADACHES).    [provider]  amLODipine  (NORVASC ) 10 MG tablet Take 1 tablet (10 mg total) by mouth daily. 04/06/24 06/05/24  Leotis Bogus, MD  hydrALAZINE  (APRESOLINE ) 25 MG tablet Take 1 tablet (25 mg total) by mouth every 8 (eight) hours. 04/05/24 05/05/24  Leotis Bogus, MD  insulin  lispro (HUMALOG ) 100 UNIT/ML KwikPen Inject 8 Units into the skin 3 (three) times daily with meals. Only take if eating a meal AND Blood Glucose (BG) is 80 or higher. 04/05/24   Leotis Bogus, MD  Insulin  Pen Needle (PEN NEEDLES) 31G X 5 MM MISC Use  three times daily 04/05/24   Leotis Bogus, MD  Lancet Device MISC Use 3 (three) times daily. 04/05/24   Leotis Bogus, MD  Lancets MISC Use to check blood sugar three times daily 04/05/24   Leotis Bogus, MD  LANTUS  SOLOSTAR 100 UNIT/ML Solostar Pen Inject 40 Units into the skin at bedtime. 04/05/24 07/04/24  Leotis Bogus, MD  ondansetron  (ZOFRAN ) 4 MG tablet Take 4 mg by mouth every 6 (six) hours as needed for nausea or vomiting.    [provider]    Allergies: Patient has no known allergies.    Review of Systems  Updated Vital Signs BP (!) 163/94 (BP Location: Right Arm)   Pulse 82    Temp 98.3 F (36.8 C) (Oral)   Resp 18   Ht 1.803 m (5' 11)   Wt 94.8 kg   SpO2 96%   BMI 29.15 kg/m   Physical Exam Vitals and nursing note reviewed.  Constitutional:      Appearance: He is well-developed. He is ill-appearing.  HENT:     Head: Normocephalic and atraumatic.     Right Ear: External ear normal.     Left Ear: External ear normal.     Mouth/Throat:     Mouth: Mucous membranes are dry.  Eyes:     General: No scleral icterus.       Right eye: No discharge.        Left eye: No discharge.     Conjunctiva/sclera: Conjunctivae normal.  Neck:     Trachea: No tracheal deviation.  Cardiovascular:     Rate and Rhythm: Normal rate and regular rhythm.  Pulmonary:     Effort: Pulmonary effort is normal. No respiratory distress.     Breath sounds: Normal breath sounds. No stridor. No wheezing or rales.  Abdominal:     General: Bowel sounds are normal. There is no distension.     Palpations: Abdomen is soft.     Tenderness: There is no abdominal tenderness. There is no guarding or rebound.  Musculoskeletal:  General: No tenderness or deformity.     Cervical back: Neck supple.  Skin:    General: Skin is warm and dry.     Findings: No rash.  Neurological:     General: No focal deficit present.     Mental Status: He is alert.     Cranial Nerves: No cranial nerve deficit, dysarthria or facial asymmetry.     Sensory: No sensory deficit.     Motor: No abnormal muscle tone or seizure activity.     Coordination: Coordination normal.  Psychiatric:        Mood and Affect: Mood normal.     (all labs ordered are listed, but only abnormal results are displayed) Labs Reviewed  URINALYSIS, ROUTINE W REFLEX MICROSCOPIC - Abnormal; Notable for the following components:      Result Value   Color, Urine COLORLESS (*)    Glucose, UA >=500 (*)    Ketones, ur 5 (*)    Leukocytes,Ua TRACE (*)    All other components within normal limits  COMPREHENSIVE METABOLIC PANEL WITH  GFR - Abnormal; Notable for the following components:   Sodium 124 (*)    Chloride 89 (*)    Glucose, Bld 703 (*)    BUN 27 (*)    Creatinine, Ser 1.62 (*)    GFR, Estimated 55 (*)    All other components within normal limits  I-STAT CHEM 8, ED - Abnormal; Notable for the following components:   Sodium 127 (*)    Chloride 93 (*)    BUN 32 (*)    Creatinine, Ser 1.60 (*)    Glucose, Bld >700 (*)    All other components within normal limits  CBC  BLOOD GAS, VENOUS  BETA-HYDROXYBUTYRIC ACID  BASIC METABOLIC PANEL WITH GFR  BASIC METABOLIC PANEL WITH GFR  BASIC METABOLIC PANEL WITH GFR  BASIC METABOLIC PANEL WITH GFR  OSMOLALITY  CBC WITH DIFFERENTIAL/PLATELET  CBG MONITORING, ED  CBG MONITORING, ED    EKG: None  Radiology: DG Chest 2 View Result Date: 05/04/2024 CLINICAL DATA:  myalgias Pt was picked up from [employer]. Reports he has been out of his insulin  x 2 weeks. Having RLQ abdominal pain, increased thirst, and increased urination EXAM: CHEST - 2 VIEW COMPARISON:  Chest x-ray 02/06/2024, CT chest 04/05/2022. FINDINGS: The heart and mediastinal contours are within normal limits. No focal consolidation. No pulmonary edema. No pleural effusion. No pneumothorax. No acute osseous abnormality. IMPRESSION: No active cardiopulmonary disease. Electronically Signed   By: Morgane  Naveau M.D.   On: 05/04/2024 23:23     .Critical Care  Performed by: Randol Simmonds, MD Authorized by: Randol Simmonds, MD   Critical care provider statement:    Critical care time (minutes):  30   Critical care was time spent personally by me on the following activities:  Development of treatment plan with patient or surrogate, discussions with consultants, evaluation of patient's response to treatment, examination of patient, ordering and review of laboratory studies, ordering and review of radiographic studies, ordering and performing treatments and interventions, pulse oximetry, re-evaluation of patient's  condition and review of old charts    Medications Ordered in the ED  lactated ringers  bolus 1,000 mL (has no administration in time range)  insulin  regular, human (MYXREDLIN ) 100 units/ 100 mL infusion (has no administration in time range)  lactated ringers  infusion (has no administration in time range)  dextrose  5 % in lactated ringers  infusion (has no administration in time range)  dextrose  50 %  solution 0-50 mL (has no administration in time range)  potassium chloride  10 mEq in 100 mL IVPB (has no administration in time range)    Clinical Course as of 05/04/24 2345  Tue May 04, 2024  2324 I-STAT Chem-8 shows glucose greater than 700 [JK]  2325 CBC DBC normal.  Venous blood gas without acidosis [JK]  2344 DG Chest 2 View Chest x-ray without signs of pneumonia [JK]    Clinical Course User Index [JK] Randol Simmonds, MD                                 Medical Decision Making Amount and/or Complexity of Data Reviewed Labs: ordered. Decision-making details documented in ED Course. Radiology: ordered.  Risk Prescription drug management.   Patient presented to the emergency room for evaluation of myalgias hyperglycemia.  Patient has not taking his diabetic medications.  Patient appears dry on exam.  His labs were notable for hyperglycemia with a blood sugar of 703.  He also has elevated creatinine consistent with an AKI.  No signs of anemia or leukocytosis.  No findings to suggest acute infection at this time.  His hyperglycemia is related to his lack of insulin .  Patient's x-ray does not show any signs of pneumonia.  Patient has been started on IV fluids and insulin  with potassium supplementation.  I will consult medical service for admission and further treatment     Final diagnoses:  Hyperglycemia    ED Discharge Orders     None          Randol Simmonds, MD 05/04/24 2349

## 2024-05-05 ENCOUNTER — Other Ambulatory Visit (HOSPITAL_BASED_OUTPATIENT_CLINIC_OR_DEPARTMENT_OTHER): Payer: Self-pay

## 2024-05-05 ENCOUNTER — Other Ambulatory Visit (HOSPITAL_COMMUNITY): Payer: Self-pay

## 2024-05-05 DIAGNOSIS — R739 Hyperglycemia, unspecified: Secondary | ICD-10-CM | POA: Diagnosis not present

## 2024-05-05 LAB — BASIC METABOLIC PANEL WITH GFR
Anion gap: 14 (ref 5–15)
Anion gap: 10 (ref 5–15)
Anion gap: 9 (ref 5–15)
Anion gap: 10 (ref 5–15)
Anion gap: 9 (ref 5–15)
BUN: 25 mg/dL — ABNORMAL HIGH (ref 6–20)
BUN: 20 mg/dL (ref 6–20)
BUN: 20 mg/dL (ref 6–20)
BUN: 19 mg/dL (ref 6–20)
BUN: 18 mg/dL (ref 6–20)
CO2: 23 mmol/L (ref 22–32)
CO2: 24 mmol/L (ref 22–32)
CO2: 25 mmol/L (ref 22–32)
CO2: 25 mmol/L (ref 22–32)
CO2: 25 mmol/L (ref 22–32)
Calcium: 9.5 mg/dL (ref 8.9–10.3)
Calcium: 9.3 mg/dL (ref 8.9–10.3)
Calcium: 9.1 mg/dL (ref 8.9–10.3)
Calcium: 9.1 mg/dL (ref 8.9–10.3)
Calcium: 9.2 mg/dL (ref 8.9–10.3)
Chloride: 93 mmol/L — ABNORMAL LOW (ref 98–111)
Chloride: 100 mmol/L (ref 98–111)
Chloride: 100 mmol/L (ref 98–111)
Chloride: 99 mmol/L (ref 98–111)
Chloride: 98 mmol/L (ref 98–111)
Creatinine, Ser: 1.56 mg/dL — ABNORMAL HIGH (ref 0.61–1.24)
Creatinine, Ser: 1.14 mg/dL (ref 0.61–1.24)
Creatinine, Ser: 1.14 mg/dL (ref 0.61–1.24)
Creatinine, Ser: 1.13 mg/dL (ref 0.61–1.24)
Creatinine, Ser: 1.13 mg/dL (ref 0.61–1.24)
GFR, Estimated: 58 mL/min — ABNORMAL LOW (ref >60)
GFR, Estimated: >60 mL/min (ref >60)
GFR, Estimated: >60 mL/min (ref >60)
GFR, Estimated: >60 mL/min (ref >60)
GFR, Estimated: >60 mL/min (ref >60)
Glucose, Bld: 495 mg/dL — ABNORMAL HIGH (ref 70–99)
Glucose, Bld: 146 mg/dL — ABNORMAL HIGH (ref 70–99)
Glucose, Bld: 167 mg/dL — ABNORMAL HIGH (ref 70–99)
Glucose, Bld: 167 mg/dL — ABNORMAL HIGH (ref 70–99)
Glucose, Bld: 274 mg/dL — ABNORMAL HIGH (ref 70–99)
Potassium: 4.4 mmol/L (ref 3.5–5.1)
Potassium: 3.9 mmol/L (ref 3.5–5.1)
Potassium: 3.6 mmol/L (ref 3.5–5.1)
Potassium: 3.5 mmol/L (ref 3.5–5.1)
Potassium: 3.9 mmol/L (ref 3.5–5.1)
Sodium: 129 mmol/L — ABNORMAL LOW (ref 135–145)
Sodium: 133 mmol/L — ABNORMAL LOW (ref 135–145)
Sodium: 134 mmol/L — ABNORMAL LOW (ref 135–145)
Sodium: 134 mmol/L — ABNORMAL LOW (ref 135–145)
Sodium: 132 mmol/L — ABNORMAL LOW (ref 135–145)

## 2024-05-05 LAB — BETA-HYDROXYBUTYRIC ACID
Beta-Hydroxybutyric Acid: 1.61 mmol/L — ABNORMAL HIGH (ref 0.05–0.27)
Beta-Hydroxybutyric Acid: 0.09 mmol/L (ref 0.05–0.27)
Beta-Hydroxybutyric Acid: 0.14 mmol/L (ref 0.05–0.27)
Beta-Hydroxybutyric Acid: 0.13 mmol/L (ref 0.05–0.27)

## 2024-05-05 LAB — MAGNESIUM: Magnesium: 2 mg/dL (ref 1.7–2.4)

## 2024-05-05 LAB — CBG MONITORING, ED
Glucose-Capillary: 157 mg/dL — ABNORMAL HIGH (ref 70–99)
Glucose-Capillary: 160 mg/dL — ABNORMAL HIGH (ref 70–99)
Glucose-Capillary: 163 mg/dL — ABNORMAL HIGH (ref 70–99)
Glucose-Capillary: 173 mg/dL — ABNORMAL HIGH (ref 70–99)
Glucose-Capillary: 178 mg/dL — ABNORMAL HIGH (ref 70–99)
Glucose-Capillary: 203 mg/dL — ABNORMAL HIGH (ref 70–99)
Glucose-Capillary: 218 mg/dL — ABNORMAL HIGH (ref 70–99)
Glucose-Capillary: 257 mg/dL — ABNORMAL HIGH (ref 70–99)
Glucose-Capillary: 270 mg/dL — ABNORMAL HIGH (ref 70–99)
Glucose-Capillary: 377 mg/dL — ABNORMAL HIGH (ref 70–99)
Glucose-Capillary: 452 mg/dL — ABNORMAL HIGH (ref 70–99)
Glucose-Capillary: 572 mg/dL (ref 70–99)
Glucose-Capillary: 580 mg/dL (ref 70–99)

## 2024-05-05 LAB — CBC
HCT: 43.3 % (ref 39.0–52.0)
Hemoglobin: 14.9 g/dL (ref 13.0–17.0)
MCH: 28 pg (ref 26.0–34.0)
MCHC: 34.4 g/dL (ref 30.0–36.0)
MCV: 81.4 fL (ref 80.0–100.0)
Platelets: 202 K/uL (ref 150–400)
RBC: 5.32 MIL/uL (ref 4.22–5.81)
RDW: 12.8 % (ref 11.5–15.5)
WBC: 4.8 K/uL (ref 4.0–10.5)
nRBC: 0 % (ref 0.0–0.2)

## 2024-05-05 LAB — PHOSPHORUS: Phosphorus: 2.8 mg/dL (ref 2.5–4.6)

## 2024-05-05 LAB — OSMOLALITY: Osmolality: 307 mosm/kg — ABNORMAL HIGH (ref 275–295)

## 2024-05-05 MED ORDER — PROCHLORPERAZINE EDISYLATE 10 MG/2ML IJ SOLN: 5.0000 mg | Freq: Four times a day (QID) | INTRAMUSCULAR | Status: DC | PRN

## 2024-05-05 MED ORDER — LANCET DEVICE MISC
1.0000 | Freq: Three times a day (TID) | 0 refills | Status: AC
  Filled 2024-05-05: qty 1, fill #0

## 2024-05-05 MED ORDER — LANCETS MISC
1.0000 | Freq: Three times a day (TID) | 0 refills | Status: DC
  Filled 2024-05-05: qty 100, 30d supply, fill #0

## 2024-05-05 MED ORDER — INSULIN LISPRO (1 UNIT DIAL) 100 UNIT/ML (KWIKPEN)
8.0000 [IU] | PEN_INJECTOR | Freq: Three times a day (TID) | SUBCUTANEOUS | 0 refills | Status: DC
  Filled 2024-05-05: qty 15, 63d supply, fill #0

## 2024-05-05 MED ORDER — POLYETHYLENE GLYCOL 3350 17 G PO PACK: 17.0000 g | PACK | Freq: Every day | ORAL | Status: DC | PRN

## 2024-05-05 MED ORDER — AMLODIPINE BESYLATE 5 MG PO TABS
10.0000 mg | ORAL_TABLET | Freq: Every day | ORAL | Status: DC
  Administered 2024-05-05: 10 mg via ORAL
  Filled 2024-05-05: qty 2

## 2024-05-05 MED ORDER — INSULIN ASPART 100 UNIT/ML IJ SOLN
5.0000 [IU] | Freq: Three times a day (TID) | INTRAMUSCULAR | Status: DC
  Administered 2024-05-05: 5 [IU] via SUBCUTANEOUS
  Filled 2024-05-05: qty 0.05

## 2024-05-05 MED ORDER — LANTUS SOLOSTAR 100 UNIT/ML ~~LOC~~ SOPN
40.0000 [IU] | PEN_INJECTOR | Freq: Every day | SUBCUTANEOUS | 0 refills | Status: DC
  Filled 2024-05-05: qty 30, 75d supply, fill #0

## 2024-05-05 MED ORDER — INSULIN GLARGINE 100 UNIT/ML ~~LOC~~ SOLN
40.0000 [IU] | Freq: Every day | SUBCUTANEOUS | Status: DC
  Administered 2024-05-05: 40 [IU] via SUBCUTANEOUS
  Filled 2024-05-05: qty 0.4

## 2024-05-05 MED ORDER — PEN NEEDLES 31G X 5 MM MISC
1.0000 | Freq: Three times a day (TID) | 0 refills | Status: DC
  Filled 2024-05-05: qty 100, 30d supply, fill #0

## 2024-05-05 MED ORDER — MELATONIN 5 MG PO TABS: 5.0000 mg | ORAL_TABLET | Freq: Every evening | ORAL | Status: DC | PRN

## 2024-05-05 MED ORDER — ACETAMINOPHEN 325 MG PO TABS: 650.0000 mg | ORAL_TABLET | Freq: Four times a day (QID) | ORAL | Status: DC | PRN

## 2024-05-05 MED ORDER — SODIUM CHLORIDE 0.9 % IV BOLUS
1000.0000 mL | Freq: Once | INTRAVENOUS | Status: AC
  Administered 2024-05-05: 1000 mL via INTRAVENOUS

## 2024-05-05 NOTE — H&P (Addendum)
 History and Physical  [name] [name] FMW:[id number] DOB: 08-Sep-1985 DOA: 05/04/2024  Referring physician: Dr. Randol, EDP  PCP: Pcp, No  Outpatient Specialists: None. Patient coming from: Home.  Chief Complaint: Abdominal pain, increased thirst, and increased urination.  HPI: [name] [name] is a 38 y.o. male with medical history significant for type 2 diabetes, hyperlipidemia, hypertension, medication noncompliance, who presents to the ER due to diffuse abdominal pain associated with increased thirst and increased urination.  The patient ran out of his home insulin  for the past 2 weeks.  EMS was activated.  CBG running high per EMS.  In the ER, hypovolemic on exam with serum glucose greater than 700, creatinine greater than baseline.  The patient was started on IV fluid and IV insulin .  Admitted for hyperglycemia in the setting of uncontrolled type 2 diabetes with medication noncompliance with  ED Course: Temperature 98.3.  Blood pressure 162/94, pulse 82, respiratory rate 18, O2 saturation 96% on room air.  Lab studies notable for serum sodium 127, glucose greater than 700, BUN 32, creatinine 1.60, serum bicarb 22, anion gap 13.  GFR 55.  Review of Systems: Review of systems as noted in the HPI. All other systems reviewed and are negative.   Past Medical History:  Diagnosis Date   Diabetes mellitus without complication (HCC)    DKA (diabetic ketoacidosis) (HCC) 05/08/2022   HTN (hypertension) 05/08/2022   Hyperlipidemia    Hyperosmolar hyperglycemic state (HHS) (HCC) 05/08/2022   Hypertension    Hypoglycemia 07/02/2019   Pseudohyponatremia 05/08/2022   Past Surgical History:  Procedure Laterality Date   HAND SURGERY      Social History:  reports that he has been smoking cigarettes. He has never used smokeless tobacco. He reports that he does not currently use drugs. He reports that he does not drink alcohol.   No Known Allergies  Family History  Family history unknown: Yes       Prior to Admission medications   Medication Sig Start Date End Date Taking? Authorizing Provider  ADVIL  200 MG CAPS Take 200 mg by mouth every 6 (six) hours as needed (FOR MILD PAIN OR HEADACHES).    [provider]  amLODipine  (NORVASC ) 10 MG tablet Take 1 tablet (10 mg total) by mouth daily. 04/06/24 06/05/24  Leotis Bogus, MD  hydrALAZINE  (APRESOLINE ) 25 MG tablet Take 1 tablet (25 mg total) by mouth every 8 (eight) hours. 04/05/24 05/05/24  Leotis Bogus, MD  insulin  lispro (HUMALOG ) 100 UNIT/ML KwikPen Inject 8 Units into the skin 3 (three) times daily with meals. Only take if eating a meal AND Blood Glucose (BG) is 80 or higher. 04/05/24   Leotis Bogus, MD  Insulin  Pen Needle (PEN NEEDLES) 31G X 5 MM MISC Use  three times daily 04/05/24   Leotis Bogus, MD  Lancet Device MISC Use 3 (three) times daily. 04/05/24   Leotis Bogus, MD  Lancets MISC Use to check blood sugar three times daily 04/05/24   Leotis Bogus, MD  LANTUS  SOLOSTAR 100 UNIT/ML Solostar Pen Inject 40 Units into the skin at bedtime. 04/05/24 07/04/24  Leotis Bogus, MD  ondansetron  (ZOFRAN ) 4 MG tablet Take 4 mg by mouth every 6 (six) hours as needed for nausea or vomiting.    [provider]    Physical Exam: BP (!) 163/94 (BP Location: Right Arm)   Pulse 82   Temp 98.3 F (36.8 C) (Oral)   Resp 18   Ht 5' 11 (1.803 m)   Wt 94.8  kg   SpO2 96%   BMI 29.15 kg/m   General: 38 y.o. year-old male well developed well nourished in no acute distress.  Alert and oriented x3. Cardiovascular: Regular rate and rhythm with no rubs or gallops.  No thyromegaly or JVD noted.  No lower extremity edema. 2/4 pulses in all 4 extremities. Respiratory: Clear to auscultation with no wheezes or rales. Good inspiratory effort. Abdomen: Soft nontender nondistended with normal bowel sounds x4 quadrants. Muskuloskeletal: No cyanosis, clubbing or edema noted bilaterally Neuro: CN II-XII intact, strength, sensation,  reflexes Skin: No ulcerative lesions noted or rashes Psychiatry: Judgement and insight appear normal. Mood is appropriate for condition and setting          Labs on Admission:  Basic Metabolic Panel: Recent Labs  Lab 05/04/24 2247 05/04/24 2301  NA 124* 127*  K 4.8 4.7  CL 89* 93*  CO2 22  --   GLUCOSE 703* >700*  BUN 27* 32*  CREATININE 1.62* 1.60*  CALCIUM  9.6  --    Liver Function Tests: Recent Labs  Lab 05/04/24 2247  AST 18  ALT 12  ALKPHOS 121  BILITOT 0.6  PROT 7.2  ALBUMIN 4.1   No results for input(s): LIPASE, AMYLASE in the last 168 hours. No results for input(s): AMMONIA in the last 168 hours. CBC: Recent Labs  Lab 05/04/24 2247 05/04/24 2301  WBC 6.1  --   HGB 16.2 17.0  HCT 47.3 50.0  MCV 82.5  --   PLT 228  --    Cardiac Enzymes: No results for input(s): CKTOTAL, CKMB, CKMBINDEX, TROPONINI in the last 168 hours.  BNP (last 3 results) No results for input(s): BNP in the last 8760 hours.  ProBNP (last 3 results) No results for input(s): PROBNP in the last 8760 hours.  CBG: No results for input(s): GLUCAP in the last 168 hours.  Radiological Exams on Admission: DG Chest 2 View Result Date: 05/04/2024 CLINICAL DATA:  myalgias Pt was picked up from [employer]. Reports he has been out of his insulin  x 2 weeks. Having RLQ abdominal pain, increased thirst, and increased urination EXAM: CHEST - 2 VIEW COMPARISON:  Chest x-ray 02/06/2024, CT chest 04/05/2022. FINDINGS: The heart and mediastinal contours are within normal limits. No focal consolidation. No pulmonary edema. No pleural effusion. No pneumothorax. No acute osseous abnormality. IMPRESSION: No active cardiopulmonary disease. Electronically Signed   By: Morgane  Naveau M.D.   On: 05/04/2024 23:23    EKG: I independently viewed the EKG done and my findings are as followed: None available at the time of this visit.  Assessment/Plan Present on Admission:   Hyperglycemia  Principal Problem:   Hyperglycemia  Type 2 diabetes of hyperglycemia secondary to medication noncompliance Ran out of his home insulin  2 weeks ago. Hemoglobin A1c greater than 15.5 on 02/28/2024 IV fluid, IV insulin  Repeat BMP Diabetes coordinator for patient's own education. TOC to assist with home insulin   Hypertension, BP is not at goal, elevated Resume home amlodipine  Closely monitor vital signs  Medication noncompliance TOC consulted for medication assistance   Time: 75 minutes.   DVT prophylaxis: Subcu Lovenox  daily  Code Status: Full code.  Family Communication: None at bedside.  Disposition Plan: Admitted to stepdown unit due to insulin  drip.  Consults called: Diabetes coordinator  Admission status: Observation status   Status is: Observation    Terry LOISE Hurst MD [facility] Pager [phone]  If 7PM-7AM, please contact night-coverage [email or web address] Password [email or web address]  05/05/2024, 12:11 AM

## 2024-05-05 NOTE — Inpatient Diabetes Management (Signed)
 Inpatient Diabetes Program Recommendations  AACE/ADA: New Consensus Statement on Inpatient Glycemic Control (2015)  Target Ranges:  Prepandial:   less than 140 mg/dL      Peak postprandial:   less than 180 mg/dL (1-2 hours)      Critically ill patients:  140 - 180 mg/dL   Lab Results  Component Value Date   GLUCAP 257 (H) 05/05/2024   HGBA1C >15.5 (H) 02/28/2024    Review of Glycemic Control  Latest Reference Range & Units 05/05/24 08:12 05/05/24 09:07 05/05/24 10:00 05/05/24 11:53  Glucose-Capillary 70 - 99 mg/dL 826 (H) 796 (H) 781 (H) 257 (H)  (H): Data is abnormally high  Diabetes history: DM2 Outpatient Diabetes medications: Lantus   40 units every day, Humalog  8 units TID Current orders for Inpatient glycemic control: Lantus  40 units every day, Novolog  5 units TID  Inpatient Diabetes Program Recommendations:    Please order:  Novolog  0-15 units TID and 0-5 units at bedtime  Received referral for A1C > 10%.  A1C was >15.5% on 02/28/24.  Attempted to speak with patient at bedside but too lethargic for a conversation.    Thank you, Wyvonna Pinal, MSN, CDCES Diabetes Coordinator Inpatient Diabetes Program [phone] (team pager from 8a-5p)

## 2024-05-05 NOTE — ED Notes (Signed)
 Patient stating that he is hungry and wants insulin  drip this continued. This RN educated patient that his blood sugars have been stable on the insulin  drip. Primary provider asked about discontinueing insulin  drip and about starting patient on a diet. Patient states that if he is unable to eat, he plans to leave AMA. This RN educated patient on risks and benefits of leaveing AMA with current barriers to securing home insulin . Patient acknowledges understanding.

## 2024-05-05 NOTE — ED Notes (Signed)
 Pt awake in no acute distress. PT offered something to drink and pt is able to take PO without difficulty. PT denies needing anything at this time. Pt waiting for inpatient bed placement

## 2024-05-05 NOTE — ED Notes (Signed)
 Per Dr. Fredia Skeeter, Dextrose  fluids to be discontinued and Normal Saline started since patient has a diet. MD also states to stop insulin  drip 1 hour after patient receives long acting insulin .   MD states that he plans to discharge patient pending lab results and glucose levels after insulin  drip is stopped.

## 2024-05-05 NOTE — ED Notes (Signed)
RN assumed care at this time.

## 2024-05-05 NOTE — ED Notes (Signed)
 Per Pahwani MD insulin  drip stopped  hour after subcut long acting insulin . MD states to to give morning meal time insulin .  MD also states that he plans to discharge patient from ED and to not send him to an impatient room if one is assigned.

## 2024-05-05 NOTE — Discharge Summary (Signed)
 Physician Discharge Summary  [name] [name] FMW:[id number] DOB: 05/05/1986 DOA: 05/04/2024  PCP: Pcp, No  Admit date: 05/04/2024 Discharge date: 05/05/2024    Admitted From: Home Disposition: Home  Recommendations for Outpatient Follow-up:  Follow up with PCP in 1-2 weeks Please obtain BMP/CBC in one week Please follow up with your PCP on the following pending results: Unresulted Labs (From admission, onward)     Start     Ordered   05/04/24 2354  Basic metabolic panel  (Diabetes Ketoacidosis (DKA))  STAT Now then every 4 hours ,   R (with STAT occurrences)      05/04/24 2355   05/04/24 2354  Beta-hydroxybutyric acid  (Diabetes Ketoacidosis (DKA))  Now then every 4 hours,   R (with TIMED occurrences)      05/04/24 2355              Home Health: None Equipment/Devices: None  Discharge Condition: Stable CODE STATUS: Full code Diet recommendation:  Diet Order             Diet Carb Modified Fluid consistency: Thin; Room service appropriate? Yes  Diet effective now                 Due to brief hospitalization, have copied admitting hospitalist HPI and ED course.  HPI: [name] [name] is a 38 y.o. male with medical history significant for type 2 diabetes, hyperlipidemia, hypertension, medication noncompliance, who presents to the ER due to diffuse abdominal pain associated with increased thirst and increased urination.  The patient ran out of his home insulin  for the past 2 weeks.  EMS was activated.  CBG running high per EMS.   In the ER, hypovolemic on exam with serum glucose greater than 700, creatinine greater than baseline.  The patient was started on IV fluid and IV insulin .  Admitted for hyperglycemia in the setting of uncontrolled type 2 diabetes with medication noncompliance with   ED Course: Temperature 98.3.  Blood pressure 162/94, pulse 82, respiratory rate 18, O2 saturation 96% on room air.  Lab studies notable for serum sodium 127, glucose greater than 700, BUN  32, creatinine 1.60, serum bicarb 22, anion gap 13.  GFR 55.  Subjective: Seen and examined earlier, he had no complaints.  Minimal abdominal pain and mild nausea but he did tolerate soft diet for the breakfast this morning.  No vomiting at all.  He said he was feeling much better than yesterday.  He clarified that he kept his insulin  in someone's car and that person went away and took his insulin  and he could not get a hold of him and he has been out of insulin  for about 7 to 8 days now.  That was the reason for him coming in due to hyperglycemia.  Brief/Interim Summary: Patient was brieflyovernight due to hypoglycemia and was started on insulin  drip.  Blood sugar has improved from 700 upon arrival to around 200.  We have also started him and gave him 41 units of Lantus  in the morning. Patient is asymptomatic.  Since patient does not have insulin  at home, I have prescribed both long-acting and short acting intervals as well as lancets and needles to pharmacy.  Discharge Diagnoses:  Principal Problem:   Hyperglycemia    Discharge Instructions   Allergies as of 05/05/2024   No Known Allergies      Medication List     TAKE these medications    Advil  200 MG Caps Generic drug: Ibuprofen  Take 200  mg by mouth every 6 (six) hours as needed (FOR MILD PAIN OR HEADACHES).   amLODipine  10 MG tablet Commonly known as: NORVASC  Take 1 tablet (10 mg total) by mouth daily.   hydrALAZINE  25 MG tablet Commonly known as: APRESOLINE  Take 1 tablet (25 mg total) by mouth every 8 (eight) hours.   insulin  lispro 100 UNIT/ML KwikPen Commonly known as: HUMALOG  Inject 8 Units into the skin 3 (three) times daily with meals. Only take if eating a meal AND Blood Glucose (BG) is 80 or higher.   Lancet Device Misc Use 3 (three) times daily.   Lancets Misc Use to check blood sugar three times daily   Lantus  SoloStar 100 UNIT/ML Solostar Pen Generic drug: insulin  glargine Inject 40 Units into the skin  at bedtime.   ondansetron  4 MG tablet Commonly known as: ZOFRAN  Take 4 mg by mouth every 6 (six) hours as needed for nausea or vomiting.   Pen Needles 31G X 5 MM Misc Use  three times daily        Follow-up Information     PCP Follow up.                 No Known Allergies  Consultations: None   Procedures/Studies: DG Chest 2 View Result Date: 05/04/2024 CLINICAL DATA:  myalgias Pt was picked up from [facility]. Reports he has been out of his insulin  x 2 weeks. Having RLQ abdominal pain, increased thirst, and increased urination EXAM: CHEST - 2 VIEW COMPARISON:  Chest x-ray 02/06/2024, CT chest 04/05/2022. FINDINGS: The heart and mediastinal contours are within normal limits. No focal consolidation. No pulmonary edema. No pleural effusion. No pneumothorax. No acute osseous abnormality. IMPRESSION: No active cardiopulmonary disease. Electronically Signed   By: Morgane  Naveau M.D.   On: 05/04/2024 23:23     Discharge Exam: Vitals:   05/05/24 0900 05/05/24 1216  BP: 138/78 139/84  Pulse: 87 81  Resp: 15 16  Temp:  98.5 F (36.9 C)  SpO2: 100% 100%   Vitals:   05/05/24 0715 05/05/24 0830 05/05/24 0900 05/05/24 1216  BP: (!) 157/115 (!) 140/100 138/78 139/84  Pulse:  74 87 81  Resp: 13 13 15 16   Temp:  97.9 F (36.6 C)  98.5 F (36.9 C)  TempSrc:  Oral  Oral  SpO2: 98% 99% 100% 100%  Weight:      Height:        General: Pt is alert, awake, not in acute distress Cardiovascular: RRR, S1/S2 +, no rubs, no gallops Respiratory: CTA bilaterally, no wheezing, no rhonchi Abdominal: Soft, NT, ND, bowel sounds + Extremities: no edema, no cyanosis    The results of significant diagnostics from this hospitalization (including imaging, microbiology, ancillary and laboratory) are listed below for reference.     Microbiology: No results found for this or any previous visit (from the past 240 hours).   Labs: BNP (last 3 results) No results for input(s): BNP in  the last 8760 hours. Basic Metabolic Panel: Recent Labs  Lab 05/05/24 0149 05/05/24 0440 05/05/24 0617 05/05/24 0803 05/05/24 1150  NA 129* 133* 134* 134* 132*  K 4.4 3.9 3.6 3.5 3.9  CL 93* 100 100 99 98  CO2 23 24 25 25 25   GLUCOSE 495* 146* 167* 167* 274*  BUN 25* 20 20 19 18   CREATININE 1.56* 1.14 1.14 1.13 1.13  CALCIUM  9.5 9.3 9.1 9.1 9.2  MG  --   --  2.0  --   --  PHOS  --   --  2.8  --   --    Liver Function Tests: Recent Labs  Lab 05/04/24 2247  AST 18  ALT 12  ALKPHOS 121  BILITOT 0.6  PROT 7.2  ALBUMIN 4.1   No results for input(s): LIPASE, AMYLASE in the last 168 hours. No results for input(s): AMMONIA in the last 168 hours. CBC: Recent Labs  Lab 05/04/24 2247 05/04/24 2301 05/05/24 0617  WBC 6.1  --  4.8  HGB 16.2 17.0 14.9  HCT 47.3 50.0 43.3  MCV 82.5  --  81.4  PLT 228  --  202   Cardiac Enzymes: No results for input(s): CKTOTAL, CKMB, CKMBINDEX, TROPONINI in the last 168 hours. BNP: Invalid input(s): POCBNP CBG: Recent Labs  Lab 05/05/24 0721 05/05/24 0812 05/05/24 0907 05/05/24 1000 05/05/24 1153  GLUCAP 163* 173* 203* 218* 257*   D-Dimer No results for input(s): DDIMER in the last 72 hours. Hgb A1c No results for input(s): HGBA1C in the last 72 hours. Lipid Profile No results for input(s): CHOL, HDL, LDLCALC, TRIG, CHOLHDL, LDLDIRECT in the last 72 hours. Thyroid  function studies No results for input(s): TSH, T4TOTAL, T3FREE, THYROIDAB in the last 72 hours.  Invalid input(s): FREET3 Anemia work up No results for input(s): VITAMINB12, FOLATE, FERRITIN, TIBC, IRON, RETICCTPCT in the last 72 hours. Urinalysis    Component Value Date/Time   COLORURINE COLORLESS (A) 05/04/2024 2210   APPEARANCEUR CLEAR 05/04/2024 2210   LABSPEC 1.022 05/04/2024 2210   PHURINE 5.0 05/04/2024 2210   GLUCOSEU >=500 (A) 05/04/2024 2210   HGBUR NEGATIVE 05/04/2024 2210   BILIRUBINUR  NEGATIVE 05/04/2024 2210   KETONESUR 5 (A) 05/04/2024 2210   PROTEINUR NEGATIVE 05/04/2024 2210   NITRITE NEGATIVE 05/04/2024 2210   LEUKOCYTESUR TRACE (A) 05/04/2024 2210   Sepsis Labs Recent Labs  Lab 05/04/24 2247 05/05/24 0617  WBC 6.1 4.8   Microbiology No results found for this or any previous visit (from the past 240 hours).  FURTHER DISCHARGE INSTRUCTIONS:   Get Medicines reviewed and adjusted: Please take all your medications with you for your next visit with your Primary MD   Laboratory/radiological data: Please request your Primary MD to go over all hospital tests and procedure/radiological results at the follow up, please ask your Primary MD to get all Hospital records sent to his/her office.   In some cases, they will be blood work, cultures and biopsy results pending at the time of your discharge. Please request that your primary care M.D. goes through all the records of your hospital data and follows up on these results.   Also Note the following: If you experience worsening of your admission symptoms, develop shortness of breath, life threatening emergency, suicidal or homicidal thoughts you must seek medical attention immediately by calling 911 or calling your MD immediately  if symptoms less severe.   You must read complete instructions/literature along with all the possible adverse reactions/side effects for all the Medicines you take and that have been prescribed to you. Take any new Medicines after you have completely understood and accpet all the possible adverse reactions/side effects.    patient was instructed, not to drive, operate heavy machinery, perform activities at heights, swimming or participation in water  activities or provide baby-sitting services while on Pain, Sleep and Anxiety Medications; until their outpatient Physician has advised to do so again. Also recommended to not to take more than prescribed Pain, Sleep and Anxiety Medications.  It is not  advisable to  combine anxiety, sleep and pain medications without talking with your primary care provider.     Wear Seat belts while driving.   Please note: You were cared for by a hospitalist during your hospital stay. Once you are discharged, your primary care physician will handle any further medical issues. Please note that NO REFILLS for any discharge medications will be authorized once you are discharged, as it is imperative that you return to your primary care physician (or establish a relationship with a primary care physician if you do not have one) for your post hospital discharge needs so that they can reassess your need for medications and monitor your lab values  Time coordinating discharge: Over 30 minutes  SIGNED:   Fredia Skeeter, MD  [facility] 05/05/2024, 12:46 PM *Please note that this is a verbal dictation therefore any spelling or grammatical errors are due to the Dragon Medical One system interpretation. If 7PM-7AM, please contact night-coverage [email or web address]

## 2024-05-07 DIAGNOSIS — F339 Major depressive disorder, recurrent, unspecified: Secondary | ICD-10-CM | POA: Diagnosis not present

## 2024-05-10 DIAGNOSIS — F339 Major depressive disorder, recurrent, unspecified: Secondary | ICD-10-CM | POA: Diagnosis not present

## 2024-05-14 DIAGNOSIS — F339 Major depressive disorder, recurrent, unspecified: Secondary | ICD-10-CM | POA: Diagnosis not present

## 2024-05-17 DIAGNOSIS — F339 Major depressive disorder, recurrent, unspecified: Secondary | ICD-10-CM | POA: Diagnosis not present

## 2024-05-20 DIAGNOSIS — F339 Major depressive disorder, recurrent, unspecified: Secondary | ICD-10-CM | POA: Diagnosis not present

## 2024-05-24 ENCOUNTER — Other Ambulatory Visit: Payer: Self-pay

## 2024-05-24 ENCOUNTER — Encounter (HOSPITAL_COMMUNITY): Payer: Self-pay

## 2024-05-24 ENCOUNTER — Emergency Department (HOSPITAL_COMMUNITY): Admission: EM | Admit: 2024-05-24 | Discharge: 2024-05-25 | Disposition: A | Discharge status: Home / Self Care

## 2024-05-24 ENCOUNTER — Emergency Department (HOSPITAL_COMMUNITY)

## 2024-05-24 DIAGNOSIS — R059 Cough, unspecified: Secondary | ICD-10-CM | POA: Diagnosis not present

## 2024-05-24 DIAGNOSIS — I10 Essential (primary) hypertension: Secondary | ICD-10-CM | POA: Diagnosis not present

## 2024-05-24 DIAGNOSIS — R42 Dizziness and giddiness: Secondary | ICD-10-CM | POA: Diagnosis not present

## 2024-05-24 DIAGNOSIS — R739 Hyperglycemia, unspecified: Secondary | ICD-10-CM | POA: Diagnosis not present

## 2024-05-24 DIAGNOSIS — Z794 Long term (current) use of insulin: Secondary | ICD-10-CM | POA: Insufficient documentation

## 2024-05-24 DIAGNOSIS — E1165 Type 2 diabetes mellitus with hyperglycemia: Secondary | ICD-10-CM | POA: Insufficient documentation

## 2024-05-24 DIAGNOSIS — F339 Major depressive disorder, recurrent, unspecified: Secondary | ICD-10-CM | POA: Diagnosis not present

## 2024-05-24 DIAGNOSIS — B349 Viral infection, unspecified: Secondary | ICD-10-CM

## 2024-05-24 DIAGNOSIS — R079 Chest pain, unspecified: Secondary | ICD-10-CM | POA: Insufficient documentation

## 2024-05-24 DIAGNOSIS — R0789 Other chest pain: Secondary | ICD-10-CM | POA: Diagnosis not present

## 2024-05-24 DIAGNOSIS — R519 Headache, unspecified: Secondary | ICD-10-CM | POA: Diagnosis not present

## 2024-05-24 DIAGNOSIS — N179 Acute kidney failure, unspecified: Secondary | ICD-10-CM

## 2024-05-24 LAB — CBC
HCT: 48.5 % (ref 39.0–52.0)
Hemoglobin: 15.7 g/dL (ref 13.0–17.0)
MCH: 27.5 pg (ref 26.0–34.0)
MCHC: 32.4 g/dL (ref 30.0–36.0)
MCV: 84.9 fL (ref 80.0–100.0)
Platelets: 241 K/uL (ref 150–400)
RBC: 5.71 MIL/uL (ref 4.22–5.81)
RDW: 12.8 % (ref 11.5–15.5)
WBC: 5.4 K/uL (ref 4.0–10.5)
nRBC: 0 % (ref 0.0–0.2)

## 2024-05-24 LAB — BASIC METABOLIC PANEL WITH GFR
Anion gap: 11 (ref 5–15)
BUN: 28 mg/dL — ABNORMAL HIGH (ref 6–20)
CO2: 26 mmol/L (ref 22–32)
Calcium: 9.2 mg/dL (ref 8.9–10.3)
Chloride: 93 mmol/L — ABNORMAL LOW (ref 98–111)
Creatinine, Ser: 1.63 mg/dL — ABNORMAL HIGH (ref 0.61–1.24)
GFR, Estimated: 55 mL/min — ABNORMAL LOW (ref >60)
Glucose, Bld: 482 mg/dL — ABNORMAL HIGH (ref 70–99)
Potassium: 4.8 mmol/L (ref 3.5–5.1)
Sodium: 130 mmol/L — ABNORMAL LOW (ref 135–145)

## 2024-05-24 LAB — RESP PANEL BY RT-PCR (RSV, FLU A&B, COVID)  RVPGX2
Influenza A by PCR: NEGATIVE
Influenza B by PCR: NEGATIVE
Resp Syncytial Virus by PCR: NEGATIVE
SARS Coronavirus 2 by RT PCR: NEGATIVE

## 2024-05-24 LAB — TROPONIN T, HIGH SENSITIVITY: Troponin T High Sensitivity: <15 ng/L (ref 0–19)

## 2024-05-24 LAB — I-STAT CHEM 8, ED
BUN: 34 mg/dL — ABNORMAL HIGH (ref 6–20)
Calcium, Ion: 0.74 mmol/L — CL (ref 1.15–1.40)
Chloride: 101 mmol/L (ref 98–111)
Creatinine, Ser: 1.7 mg/dL — ABNORMAL HIGH (ref 0.61–1.24)
Glucose, Bld: 483 mg/dL — ABNORMAL HIGH (ref 70–99)
HCT: 49 % (ref 39.0–52.0)
Hemoglobin: 16.7 g/dL (ref 13.0–17.0)
Potassium: 4.5 mmol/L (ref 3.5–5.1)
Sodium: 128 mmol/L — ABNORMAL LOW (ref 135–145)
TCO2: 24 mmol/L (ref 22–32)

## 2024-05-24 LAB — CBG MONITORING, ED: Glucose-Capillary: 451 mg/dL — ABNORMAL HIGH (ref 70–99)

## 2024-05-24 MED ORDER — PROCHLORPERAZINE EDISYLATE 10 MG/2ML IJ SOLN
10.0000 mg | Freq: Once | INTRAMUSCULAR | Status: AC
  Administered 2024-05-24: 10 mg via INTRAVENOUS
  Filled 2024-05-24: qty 2

## 2024-05-24 MED ORDER — LACTATED RINGERS IV BOLUS
1000.0000 mL | Freq: Once | INTRAVENOUS | Status: AC
  Administered 2024-05-24: 1000 mL via INTRAVENOUS

## 2024-05-24 MED ORDER — INSULIN ASPART 100 UNIT/ML IJ SOLN
12.0000 [IU] | Freq: Once | INTRAMUSCULAR | Status: AC
Start: 2024-05-24 — End: 2024-05-24
  Administered 2024-05-24: 12 [IU] via SUBCUTANEOUS
  Filled 2024-05-24: qty 0.12

## 2024-05-24 MED ORDER — DIPHENHYDRAMINE HCL 50 MG/ML IJ SOLN
12.5000 mg | Freq: Once | INTRAMUSCULAR | Status: AC
  Administered 2024-05-24: 12.5 mg via INTRAVENOUS
  Filled 2024-05-24: qty 1

## 2024-05-24 NOTE — ED Triage Notes (Signed)
 Pt. Arrives via ems from [employer] for sleeping. Pt. Is hyperglycemic at 343 with EMS. Also hypertensive 178/100. Pt. C/o nausea dizziness.

## 2024-05-24 NOTE — ED Provider Notes (Signed)
 [place] EMERGENCY DEPARTMENT AT [facility] Provider Note   CSN: [id number] Arrival date & time: 05/24/24  2045     Patient presents with: Hyperglycemia   [name] [name] is a 38 y.o. male.   38 year old male with past medical history of hypertension and diabetes presenting to the emergency department today with cough, headache, and lightheadedness.  The patient has been having elevated blood sugars over the past few days.  He states he has been using his insulin  as prescribed.  States that he has been having some polyuria and polydipsia.  Reports that he vomited 2 days ago.  Reports a mild global headache with this.  He states that he is also had a cough that has been minimally to nonproductive.  Denies any hemoptysis.  He denies a history of DVT or pulmonary embolism, recent surgeries, recent travel.  He was brought to the emergency department today for further evaluation because he was sleeping in [employer].  He denies any fevers.   Hyperglycemia Associated symptoms: increased thirst and polyuria        Prior to Admission medications   Medication Sig Start Date End Date Taking? Authorizing Provider  amLODipine  (NORVASC ) 10 MG tablet Take 1 tablet (10 mg total) by mouth daily. 04/06/24 06/05/24 Yes Leotis Bogus, MD  insulin  lispro (HUMALOG ) 100 UNIT/ML KwikPen Inject 8 Units into the skin 3 (three) times daily with meals. Only take if eating a meal AND Blood Glucose (BG) is 80 or higher. 05/05/24  Yes Pahwani, Fredia, MD  LANTUS  SOLOSTAR 100 UNIT/ML Solostar Pen Inject 40 Units into the skin at bedtime. Patient taking differently: Inject 30 Units into the skin at bedtime. 05/05/24 08/03/24 Yes Pahwani, Fredia, MD  lisinopril  (ZESTRIL ) 5 MG tablet Take 5 mg by mouth daily.   Yes [provider]  hydrALAZINE  (APRESOLINE ) 25 MG tablet Take 1 tablet (25 mg total) by mouth every 8 (eight) hours. Patient not taking: Reported on 05/24/2024 04/05/24 05/24/24  Leotis Bogus,  MD  Insulin  Pen Needle (PEN NEEDLES) 31G X 5 MM MISC Use  three times daily 05/05/24   Vernon Fredia, MD  Lancet Device MISC Use 3 (three) times daily. 05/05/24   Vernon Fredia, MD  Lancets MISC Use to check blood sugar three times daily 05/05/24   Vernon Fredia, MD    Allergies: Patient has no known allergies.    Review of Systems  Respiratory:  Positive for cough.   Endocrine: Positive for polydipsia and polyuria.  Neurological:  Positive for headaches.  All other systems reviewed and are negative.   Updated Vital Signs BP (!) 132/93 (BP Location: Left Arm)   Pulse 97   Temp 98 F (36.7 C) (Oral)   Resp 18   SpO2 100%   Physical Exam Vitals and nursing note reviewed.   Gen: NAD Eyes: PERRL, EOMI HEENT: no oropharyngeal swelling Neck: trachea midline Resp: clear to auscultation bilaterally Card: RRR, no murmurs, rubs, or gallops Abd: nontender, nondistended Extremities: no calf tenderness, no edema Vascular: 2+ radial pulses bilaterally, 2+ DP pulses bilaterally Skin: no rashes Psyc: acting appropriately   (all labs ordered are listed, but only abnormal results are displayed) Labs Reviewed  BASIC METABOLIC PANEL WITH GFR - Abnormal; Notable for the following components:      Result Value   Sodium 130 (*)    Chloride 93 (*)    Glucose, Bld 482 (*)    BUN 28 (*)    Creatinine, Ser 1.63 (*)  GFR, Estimated 55 (*)    All other components within normal limits  CBG MONITORING, ED - Abnormal; Notable for the following components:   Glucose-Capillary 451 (*)    All other components within normal limits  I-STAT CHEM 8, ED - Abnormal; Notable for the following components:   Sodium 128 (*)    BUN 34 (*)    Creatinine, Ser 1.70 (*)    Glucose, Bld 483 (*)    Calcium , Ion 0.74 (*)    All other components within normal limits  RESP PANEL BY RT-PCR (RSV, FLU A&B, COVID)  RVPGX2  CBC  URINALYSIS, ROUTINE W REFLEX MICROSCOPIC  TROPONIN T, HIGH SENSITIVITY  TROPONIN T,  HIGH SENSITIVITY    EKG: EKG Interpretation Date/Time:  Monday May 24 2024 22:45:34 EDT Ventricular Rate:  93 PR Interval:  129 QRS Duration:  106 QT Interval:  375 QTC Calculation: 467 R Axis:   -61  Text Interpretation: Sinus rhythm LAD, consider left anterior fascicular block Confirmed by Ula Barter [phone]) on 05/24/2024 10:57:15 PM  Radiology: DG Chest Portable 1 View Result Date: 05/24/2024 CLINICAL DATA:  Cough. EXAM: PORTABLE CHEST 1 VIEW COMPARISON:  05/04/2024 FINDINGS: The cardiomediastinal contours are normal. The lungs are clear. Pulmonary vasculature is normal. No consolidation, pleural effusion, or pneumothorax. No acute osseous abnormalities are seen. IMPRESSION: No active disease. Electronically Signed   By: Andrea Gasman M.D.   On: 05/24/2024 22:45   CT Head Wo Contrast Result Date: 05/24/2024 CLINICAL DATA:  Headache EXAM: CT HEAD WITHOUT CONTRAST TECHNIQUE: Contiguous axial images were obtained from the base of the skull through the vertex without intravenous contrast. RADIATION DOSE REDUCTION: This exam was performed according to the departmental dose-optimization program which includes automated exposure control, adjustment of the mA and/or kV according to patient size and/or use of iterative reconstruction technique. COMPARISON:  CT 02/15/2024, 05/07/2023, 01/06/2023 FINDINGS: Brain: No evidence of acute infarction, hemorrhage, hydrocephalus, extra-axial collection or mass lesion/mass effect. Vascular: No hyperdense vessel or unexpected calcification. Skull: Normal. Negative for fracture or focal lesion. Sinuses/Orbits: No acute finding. Other: Stable scalp soft tissues. IMPRESSION: No CT evidence for acute intracranial abnormality Electronically Signed   By: Luke Bun M.D.   On: 05/24/2024 22:41     Procedures   Medications Ordered in the ED  prochlorperazine  (COMPAZINE ) injection 10 mg (10 mg Intravenous Given 05/24/24 2248)  diphenhydrAMINE  (BENADRYL) injection 12.5 mg (12.5 mg Intravenous Given 05/24/24 2247)  lactated ringers  bolus 1,000 mL (1,000 mLs Intravenous New Bag/Given 05/24/24 2255)  lactated ringers  bolus 1,000 mL (1,000 mLs Intravenous New Bag/Given 05/24/24 2254)  insulin  aspart (novoLOG ) injection 12 Units (12 Units Subcutaneous Given 05/24/24 2328)                                    Medical Decision Making 38 year old male with past medical history diabetes and hypertension presenting to the emergency department today with chest pain and headache as well as cough and hyperglycemia.  The patient is well-appearing here with reassuring neurologic exam.  I will further evaluate him here with basic labs as well as a urinalysis to evaluate for DKA.  I will give patient IV fluids here.  Will obtain a CT scan of his head and give the patient Compazine  and Benadryl for his headache.  Based on description of his symptoms and what is going on now for the past few days and gradually worsening suspicion  for subarachnoid hemorrhage is low at this time.  Will obtain a chest x-ray here as well as an EKG and troponin in regards to his chest pain to evaluate for ACS, pulmonary edema, pulm infiltrates, pneumothorax, myocarditis, or pericarditis.  The patient is PERC negative and based on this suspicion for pulmonary embolism is low at this time.  Also doubt this is due to aortic dissection given reassuring neurovascular exam.  I will reevaluate for ultimate disposition.  Will obtain an RSV/COVID/flu swab on the patient to evaluate for viral etiologies.  His symptoms certainly do fit with viral syndrome.  The patient's initial troponin is negative.  He does state that the chest pain started just prior to arrival so we will obtain a second troponin.  Rest of his labs are reassuring.  He is given insulin  in addition to fluids and plan is for reevaluation after workup for ultimate disposition.  Signed out to oncoming provider.  Amount and/or  Complexity of Data Reviewed Labs: ordered. Radiology: ordered.  Risk Prescription drug management.        Final diagnoses:  Hyperglycemia  Chest pain, unspecified type    ED Discharge Orders     None          Ula Prentice SAUNDERS, MD 05/24/24 2350

## 2024-05-24 NOTE — ED Provider Notes (Signed)
  Provider Note MRN:  [id number]  Arrival date & time: 05/25/24    ED Course and Medical Decision Making  Assumed care of patient at sign-out or upon transfer.  Hyperglycemia with diabetes and lightheadedness awaiting second troponin, providing insulin , candidate for discharge if feeling better and workup reassuring.  6:45 AM update: Patient sleeping deeply throughout the night.  Continues to deny need to urinate, suspect was dehydrated related to the hyperglycemia.  Providing further fluids, will obtain catheterization if he is unable to void.  He is endorsing dysuria recently.  Signed out to oncoming provider at shift change.  Procedures  Final Clinical Impressions(s) / ED Diagnoses     ICD-10-CM   1. Hyperglycemia  R73.9     2. Chest pain, unspecified type  R07.9       ED Discharge Orders     None         Discharge Instructions      Your workup today was reassuring.  Please follow-up with your doctor and return to the ER for worsening symptoms.  Be sure to use your insulin  as prescribed.      Ozell HERO. Theadore, MD [facility] Emergency Medicine [facility] [email or web address]    Theadore Ozell HERO, MD 05/25/24 [phone]

## 2024-05-24 NOTE — Discharge Instructions (Signed)
 Your workup today was reassuring.  Please follow-up with your doctor and return to the ER for worsening symptoms.  Be sure to use your insulin  as prescribed.  Please follow up with your primary care doctor for ongoing care, diabetes management  Please return to the emergency department for any worsening or worrisome symptoms.

## 2024-05-25 LAB — TROPONIN T, HIGH SENSITIVITY: Troponin T High Sensitivity: 15 ng/L (ref 0–19)

## 2024-05-25 LAB — URINALYSIS, ROUTINE W REFLEX MICROSCOPIC
Bacteria, UA: NONE SEEN
Bilirubin Urine: NEGATIVE
Glucose, UA: >=500 mg/dL — AB
Hgb urine dipstick: NEGATIVE
Ketones, ur: NEGATIVE mg/dL
Leukocytes,Ua: NEGATIVE
Nitrite: NEGATIVE
Protein, ur: NEGATIVE mg/dL
Specific Gravity, Urine: 1.026 (ref 1.005–1.030)
pH: 5 (ref 5.0–8.0)

## 2024-05-25 LAB — CBG MONITORING, ED: Glucose-Capillary: 275 mg/dL — ABNORMAL HIGH (ref 70–99)

## 2024-05-25 MED ORDER — SODIUM CHLORIDE 0.9 % IV BOLUS: 1000.0000 mL | Freq: Once | INTRAVENOUS | Status: DC

## 2024-05-25 MED ORDER — ACETAMINOPHEN 500 MG PO TABS
1000.0000 mg | ORAL_TABLET | Freq: Once | ORAL | Status: AC
  Administered 2024-05-25: 1000 mg via ORAL
  Filled 2024-05-25: qty 2

## 2024-05-25 NOTE — ED Provider Notes (Signed)
  Provider Note MRN:  [id number]  Arrival date & time: 05/26/24    ED Course and Medical Decision Making  Assumed care from Dr Theadore at shift change.  See note from prior team for complete details, in brief:   Clinical Course as of 05/26/24 0727  Tue May 25, 2024  0702 Handoff MB 38yo/m Hyperglycemia Malaisa/sleepy Aki Feeling better, more awake UA to dispo [SG]  0944 Low HEART score [SG]    Clinical Course User Index [SG] Elnor Jayson LABOR, DO   Patient is feeling much better, tolerating p.o. no difficulty, no vomiting or nausea.  Analysis without evidence of infection, he does have glucosuria, poorly controlled diabetes  Encourage outpatient follow-up with PCP for further management of his poorly controlled diabetes  Patient in no distress and overall condition is stable. Detailed discussions were had with the patient/guardian regarding current findings, and need for close f/u with PCP or on call doctor. The patient/guardian has been instructed to return immediately if the symptoms worsen in any way for re-evaluation. Patient/guardian verbalized understanding and is in agreement with current care plan. All questions answered prior to discharge.    Procedures  Final Clinical Impressions(s) / ED Diagnoses     ICD-10-CM   1. Hyperglycemia  R73.9     2. AKI (acute kidney injury)  N17.9     3. Chest pain with low risk of acute coronary syndrome  R07.9     4. Viral syndrome  B34.9       ED Discharge Orders     None         Discharge Instructions      Your workup today was reassuring.  Please follow-up with your doctor and return to the ER for worsening symptoms.  Be sure to use your insulin  as prescribed.  Please follow up with your primary care doctor for ongoing care, diabetes management  Please return to the emergency department for any worsening or worrisome symptoms.        Elnor Jayson LABOR, DO 05/26/24 [phone]

## 2024-05-27 DIAGNOSIS — F339 Major depressive disorder, recurrent, unspecified: Secondary | ICD-10-CM | POA: Diagnosis not present

## 2024-05-28 DIAGNOSIS — F339 Major depressive disorder, recurrent, unspecified: Secondary | ICD-10-CM | POA: Diagnosis not present

## 2024-05-31 ENCOUNTER — Encounter (HOSPITAL_COMMUNITY): Payer: Self-pay | Admitting: Emergency Medicine

## 2024-05-31 ENCOUNTER — Emergency Department (HOSPITAL_COMMUNITY)
Admission: EM | Admit: 2024-05-31 | Discharge: 2024-06-01 | Disposition: A | Attending: Emergency Medicine | Admitting: Emergency Medicine

## 2024-05-31 ENCOUNTER — Other Ambulatory Visit: Payer: Self-pay

## 2024-05-31 DIAGNOSIS — E1165 Type 2 diabetes mellitus with hyperglycemia: Secondary | ICD-10-CM | POA: Diagnosis not present

## 2024-05-31 DIAGNOSIS — Z794 Long term (current) use of insulin: Secondary | ICD-10-CM | POA: Diagnosis not present

## 2024-05-31 DIAGNOSIS — R739 Hyperglycemia, unspecified: Secondary | ICD-10-CM

## 2024-05-31 DIAGNOSIS — E878 Other disorders of electrolyte and fluid balance, not elsewhere classified: Secondary | ICD-10-CM | POA: Insufficient documentation

## 2024-05-31 DIAGNOSIS — R3589 Other polyuria: Secondary | ICD-10-CM | POA: Diagnosis present

## 2024-05-31 DIAGNOSIS — E871 Hypo-osmolality and hyponatremia: Secondary | ICD-10-CM | POA: Insufficient documentation

## 2024-05-31 LAB — CBC WITH DIFFERENTIAL/PLATELET
Abs Immature Granulocytes: 0.01 K/uL (ref 0.00–0.07)
Basophils Absolute: 0.1 K/uL (ref 0.0–0.1)
Basophils Relative: 1 %
Eosinophils Absolute: 0.1 K/uL (ref 0.0–0.5)
Eosinophils Relative: 2 %
HCT: 46.4 % (ref 39.0–52.0)
Hemoglobin: 15.8 g/dL (ref 13.0–17.0)
Immature Granulocytes: 0 %
Lymphocytes Relative: 32 %
Lymphs Abs: 1.7 K/uL (ref 0.7–4.0)
MCH: 28.2 pg (ref 26.0–34.0)
MCHC: 34.1 g/dL (ref 30.0–36.0)
MCV: 82.7 fL (ref 80.0–100.0)
Monocytes Absolute: 0.5 K/uL (ref 0.1–1.0)
Monocytes Relative: 9 %
Neutro Abs: 3 K/uL (ref 1.7–7.7)
Neutrophils Relative %: 56 %
Platelets: 239 K/uL (ref 150–400)
RBC: 5.61 MIL/uL (ref 4.22–5.81)
RDW: 12.6 % (ref 11.5–15.5)
WBC: 5.3 K/uL (ref 4.0–10.5)
nRBC: 0 % (ref 0.0–0.2)

## 2024-05-31 LAB — COMPREHENSIVE METABOLIC PANEL WITH GFR
ALT: 25 U/L (ref 0–44)
AST: 29 U/L (ref 15–41)
Albumin: 4 g/dL (ref 3.5–5.0)
Alkaline Phosphatase: 104 U/L (ref 38–126)
Anion gap: 11 (ref 5–15)
BUN: 26 mg/dL — ABNORMAL HIGH (ref 6–20)
CO2: 25 mmol/L (ref 22–32)
Calcium: 9.7 mg/dL (ref 8.9–10.3)
Chloride: 93 mmol/L — ABNORMAL LOW (ref 98–111)
Creatinine, Ser: 1.39 mg/dL — ABNORMAL HIGH (ref 0.61–1.24)
GFR, Estimated: >60 mL/min (ref >60)
Glucose, Bld: 511 mg/dL (ref 70–99)
Potassium: 4.7 mmol/L (ref 3.5–5.1)
Sodium: 128 mmol/L — ABNORMAL LOW (ref 135–145)
Total Bilirubin: 0.5 mg/dL (ref 0.0–1.2)
Total Protein: 7.2 g/dL (ref 6.5–8.1)

## 2024-05-31 LAB — BLOOD GAS, VENOUS
Acid-Base Excess: 2.8 mmol/L — ABNORMAL HIGH (ref 0.0–2.0)
Bicarbonate: 28.5 mmol/L — ABNORMAL HIGH (ref 20.0–28.0)
O2 Saturation: 95.4 %
Patient temperature: 37
pCO2, Ven: 47 mmHg (ref 44–60)
pH, Ven: 7.39 (ref 7.25–7.43)
pO2, Ven: 71 mmHg — ABNORMAL HIGH (ref 32–45)

## 2024-05-31 LAB — URINALYSIS, ROUTINE W REFLEX MICROSCOPIC
Bacteria, UA: NONE SEEN
Bilirubin Urine: NEGATIVE
Glucose, UA: >=500 mg/dL — AB
Hgb urine dipstick: NEGATIVE
Ketones, ur: NEGATIVE mg/dL
Leukocytes,Ua: NEGATIVE
Nitrite: NEGATIVE
Protein, ur: NEGATIVE mg/dL
Specific Gravity, Urine: 1.028 (ref 1.005–1.030)
pH: 5 (ref 5.0–8.0)

## 2024-05-31 LAB — CBG MONITORING, ED: Glucose-Capillary: 528 mg/dL (ref 70–99)

## 2024-05-31 MED ORDER — INSULIN GLARGINE-YFGN 100 UNIT/ML ~~LOC~~ SOLN
40.0000 [IU] | Freq: Once | SUBCUTANEOUS | Status: AC
  Administered 2024-05-31: 40 [IU] via SUBCUTANEOUS
  Filled 2024-05-31: qty 0.4

## 2024-05-31 MED ORDER — SODIUM CHLORIDE 0.9 % IV BOLUS
1000.0000 mL | Freq: Once | INTRAVENOUS | Status: AC
  Administered 2024-05-31: 1000 mL via INTRAVENOUS

## 2024-05-31 NOTE — Discharge Instructions (Addendum)
 Today you were seen for hyperglycemia.  Please take your diabetic medications as prescribed.  Thank you for letting us  treat you today. After reviewing your labs, I feel you are safe to go home. Please follow up with your PCP in the next several days and provide them with your records from this visit. Return to the Emergency Room if pain becomes severe or symptoms worsen.

## 2024-05-31 NOTE — ED Provider Notes (Signed)
 [place] EMERGENCY DEPARTMENT AT [facility] Provider Note   CSN: [id number] Arrival date & time: 05/31/24  2122     Patient presents with: Hyperglycemia   [name] [name] is a 38 y.o. male.  Past medical history significant for diabetes, AKI, and amphetamine abuse presents today for hyperglycemia, fatigue, and dizziness.  Patient reports taking his Lantus  and NovoLog  at 1 PM.  Patient also endorses polyuria and polydipsia.  Patient denies chest pain, shortness of breath, abdominal pain, nausea, vomiting, dysuria, hematuria, diarrhea, or constipation.  Patient is somnolent on exam.    Hyperglycemia Associated symptoms: increased thirst and polyuria        Prior to Admission medications   Medication Sig Start Date End Date Taking? Authorizing Provider  amLODipine  (NORVASC ) 10 MG tablet Take 1 tablet (10 mg total) by mouth daily. 04/06/24 06/05/24  Leotis Bogus, MD  hydrALAZINE  (APRESOLINE ) 25 MG tablet Take 1 tablet (25 mg total) by mouth every 8 (eight) hours. Patient not taking: Reported on 05/24/2024 04/05/24 05/24/24  Leotis Bogus, MD  insulin  lispro (HUMALOG ) 100 UNIT/ML KwikPen Inject 8 Units into the skin 3 (three) times daily with meals. Only take if eating a meal AND Blood Glucose (BG) is 80 or higher. 05/05/24   Vernon Ranks, MD  Insulin  Pen Needle (PEN NEEDLES) 31G X 5 MM MISC Use  three times daily 05/05/24   Vernon Ranks, MD  Lancet Device MISC Use 3 (three) times daily. 05/05/24   Vernon Ranks, MD  Lancets MISC Use to check blood sugar three times daily 05/05/24   Vernon Ranks, MD  LANTUS  SOLOSTAR 100 UNIT/ML Solostar Pen Inject 40 Units into the skin at bedtime. Patient taking differently: Inject 30 Units into the skin at bedtime. 05/05/24 08/03/24  Vernon Ranks, MD  lisinopril  (ZESTRIL ) 5 MG tablet Take 5 mg by mouth daily.    [provider]    Allergies: Patient has no known allergies.    Review of Systems  Endocrine: Positive for polydipsia  and polyuria.    Updated Vital Signs BP (!) 142/92 (BP Location: Left Arm)   Pulse 92   Temp 97.7 F (36.5 C) (Oral)   Resp 14   SpO2 99%   Physical Exam Vitals and nursing note reviewed.  Constitutional:      General: He is not in acute distress.    Appearance: He is well-developed. He is not toxic-appearing.  HENT:     Head: Normocephalic and atraumatic.  Eyes:     Extraocular Movements: Extraocular movements intact.     Conjunctiva/sclera: Conjunctivae normal.  Cardiovascular:     Rate and Rhythm: Normal rate and regular rhythm.     Pulses: Normal pulses.     Heart sounds: Normal heart sounds. No murmur heard. Pulmonary:     Effort: Pulmonary effort is normal. No respiratory distress.     Breath sounds: Normal breath sounds.  Abdominal:     General: There is no distension.     Palpations: Abdomen is soft.     Tenderness: There is no abdominal tenderness.  Musculoskeletal:        General: No swelling.     Cervical back: Neck supple.  Skin:    General: Skin is warm and dry.     Capillary Refill: Capillary refill takes less than 2 seconds.  Neurological:     General: No focal deficit present.     Mental Status: He is alert and oriented to person, place, and time.  Psychiatric:  Mood and Affect: Mood normal.     (all labs ordered are listed, but only abnormal results are displayed) Labs Reviewed  COMPREHENSIVE METABOLIC PANEL WITH GFR - Abnormal; Notable for the following components:      Result Value   Sodium 128 (*)    Chloride 93 (*)    Glucose, Bld 511 (*)    BUN 26 (*)    Creatinine, Ser 1.39 (*)    All other components within normal limits  BLOOD GAS, VENOUS - Abnormal; Notable for the following components:   pO2, Ven 71 (*)    Bicarbonate 28.5 (*)    Acid-Base Excess 2.8 (*)    All other components within normal limits  CBG MONITORING, ED - Abnormal; Notable for the following components:   Glucose-Capillary 528 (*)    All other components  within normal limits  CBC WITH DIFFERENTIAL/PLATELET  URINALYSIS, ROUTINE W REFLEX MICROSCOPIC  URINE DRUG SCREEN    EKG: None  Radiology: No results found.   Procedures   Medications Ordered in the ED  sodium chloride  0.9 % bolus 1,000 mL (1,000 mLs Intravenous Bolus from Bag 05/31/24 2236)  insulin  glargine-yfgn (SEMGLEE ) injection 40 Units (40 Units Subcutaneous Given 05/31/24 2335)                                    Medical Decision Making Amount and/or Complexity of Data Reviewed Labs: ordered.   This patient presents to the ED for concern of hyperglycemia differential diagnosis includes intoxication, HHS, hyperglycemia, DKA    Additional history obtained   Additional history obtained from Electronic Medical Record External records from outside source obtained and reviewed including previous admission notes   Lab Tests:  I Ordered, and personally interpreted labs.  The pertinent results include: Hyperglycemia 528, CBC unremarkable, PaO2 71, elevated bicarb at 28.5, hyponatremia of 128 corrected to 135 in the setting of hyperglycemia, hypochloremia 93, elevated bun at 26, elevated creatinine at 1.39 which is around baseline per historical values   Medicines ordered and prescription drug management: I ordered medication including IVF and insulin     I have reviewed the patients home medicines and have made adjustments as needed  Patient signed out to Leita Chancy, PA-C pending UA and UDS.  Patient does not appear to be in DKA or HHS at this time.  Highly suspect hyperglycemia and intoxication.  Patient will likely be observed until return to baseline and discharge.  Please refer to PA Murphy's note for full ED course.     Final diagnoses:  Hyperglycemia    ED Discharge Orders     None          [name] [name] 05/31/24 2353    Mannie Pac T, DO 06/01/24 2241

## 2024-05-31 NOTE — ED Notes (Addendum)
 Urine sent

## 2024-05-31 NOTE — ED Provider Notes (Signed)
 38 yo male with hyperglycemia, complaint of fatigue (likely related to substance use).  Awaiting UA and UDS. Hx amphetamine use. No abdominal pain, nausea, vomiting.  Fluids, insulin .  If tolerating PO, can be dc.  Physical Exam  BP (!) 142/92 (BP Location: Left Arm)   Pulse 92   Temp 97.7 F (36.5 C) (Oral)   Resp 14   SpO2 99%   Physical Exam  Procedures  Procedures  ED Course / MDM    Medical Decision Making Amount and/or Complexity of Data Reviewed Labs: ordered.  Risk Prescription drug management.   ***

## 2024-05-31 NOTE — ED Notes (Signed)
 Critical from lab sugar of 511

## 2024-05-31 NOTE — ED Triage Notes (Signed)
 Pt BIBA from [facility] c/o hyperglycemia, pt reports taking lantus  and novolog  at 1pm, lethargic, dizzy but able to ambulate independently.  PTA  CBG 509

## 2024-06-01 ENCOUNTER — Other Ambulatory Visit (HOSPITAL_COMMUNITY): Payer: Self-pay

## 2024-06-01 LAB — CBG MONITORING, ED
Glucose-Capillary: 210 mg/dL — ABNORMAL HIGH (ref 70–99)
Glucose-Capillary: 323 mg/dL — ABNORMAL HIGH (ref 70–99)
Glucose-Capillary: 339 mg/dL — ABNORMAL HIGH (ref 70–99)

## 2024-06-01 LAB — URINE DRUG SCREEN
Amphetamines: POSITIVE — AB
Barbiturates: NEGATIVE
Benzodiazepines: NEGATIVE
Cocaine: NEGATIVE
Fentanyl: NEGATIVE
Methadone Scn, Ur: NEGATIVE
Opiates: NEGATIVE
Tetrahydrocannabinol: NEGATIVE

## 2024-06-02 DIAGNOSIS — F339 Major depressive disorder, recurrent, unspecified: Secondary | ICD-10-CM | POA: Diagnosis not present

## 2024-06-03 ENCOUNTER — Telehealth: Payer: Self-pay

## 2024-06-03 DIAGNOSIS — F339 Major depressive disorder, recurrent, unspecified: Secondary | ICD-10-CM | POA: Diagnosis not present

## 2024-06-03 DIAGNOSIS — E1159 Type 2 diabetes mellitus with other circulatory complications: Secondary | ICD-10-CM

## 2024-06-04 ENCOUNTER — Emergency Department (HOSPITAL_COMMUNITY)
Admission: EM | Admit: 2024-06-04 | Discharge: 2024-06-05 | Disposition: A | Discharge status: Home / Self Care | Attending: Emergency Medicine | Admitting: Emergency Medicine

## 2024-06-04 ENCOUNTER — Other Ambulatory Visit: Payer: Self-pay

## 2024-06-04 ENCOUNTER — Emergency Department (HOSPITAL_COMMUNITY)

## 2024-06-04 DIAGNOSIS — I10 Essential (primary) hypertension: Secondary | ICD-10-CM | POA: Insufficient documentation

## 2024-06-04 DIAGNOSIS — R000 Tachycardia, unspecified: Secondary | ICD-10-CM | POA: Diagnosis not present

## 2024-06-04 DIAGNOSIS — F1010 Alcohol abuse, uncomplicated: Secondary | ICD-10-CM | POA: Insufficient documentation

## 2024-06-04 DIAGNOSIS — Z794 Long term (current) use of insulin: Secondary | ICD-10-CM | POA: Insufficient documentation

## 2024-06-04 DIAGNOSIS — Z79899 Other long term (current) drug therapy: Secondary | ICD-10-CM | POA: Diagnosis not present

## 2024-06-04 DIAGNOSIS — Y900 Blood alcohol level of less than 20 mg/100 ml: Secondary | ICD-10-CM | POA: Diagnosis not present

## 2024-06-04 DIAGNOSIS — R0789 Other chest pain: Secondary | ICD-10-CM | POA: Diagnosis not present

## 2024-06-04 DIAGNOSIS — E162 Hypoglycemia, unspecified: Secondary | ICD-10-CM

## 2024-06-04 DIAGNOSIS — E11649 Type 2 diabetes mellitus with hypoglycemia without coma: Secondary | ICD-10-CM | POA: Insufficient documentation

## 2024-06-04 DIAGNOSIS — R079 Chest pain, unspecified: Secondary | ICD-10-CM | POA: Diagnosis not present

## 2024-06-04 LAB — URINALYSIS, ROUTINE W REFLEX MICROSCOPIC
Bacteria, UA: NONE SEEN
Bilirubin Urine: NEGATIVE
Glucose, UA: >=500 mg/dL — AB
Hgb urine dipstick: NEGATIVE
Ketones, ur: NEGATIVE mg/dL
Leukocytes,Ua: NEGATIVE
Nitrite: NEGATIVE
Protein, ur: NEGATIVE mg/dL
Specific Gravity, Urine: 1.022 (ref 1.005–1.030)
pH: 5 (ref 5.0–8.0)

## 2024-06-04 LAB — CBG MONITORING, ED
Glucose-Capillary: 142 mg/dL — ABNORMAL HIGH (ref 70–99)
Glucose-Capillary: 148 mg/dL — ABNORMAL HIGH (ref 70–99)
Glucose-Capillary: 63 mg/dL — ABNORMAL LOW (ref 70–99)
Glucose-Capillary: 69 mg/dL — ABNORMAL LOW (ref 70–99)
Glucose-Capillary: 96 mg/dL (ref 70–99)

## 2024-06-04 LAB — BASIC METABOLIC PANEL WITH GFR
Anion gap: 13 (ref 5–15)
BUN: 24 mg/dL — ABNORMAL HIGH (ref 6–20)
CO2: 19 mmol/L — ABNORMAL LOW (ref 22–32)
Calcium: 8.6 mg/dL — ABNORMAL LOW (ref 8.9–10.3)
Chloride: 103 mmol/L (ref 98–111)
Creatinine, Ser: 1.48 mg/dL — ABNORMAL HIGH (ref 0.61–1.24)
GFR, Estimated: >60 mL/min (ref >60)
Glucose, Bld: 70 mg/dL (ref 70–99)
Potassium: 3.6 mmol/L (ref 3.5–5.1)
Sodium: 135 mmol/L (ref 135–145)

## 2024-06-04 LAB — TROPONIN I (HIGH SENSITIVITY)
Troponin I (High Sensitivity): 7 ng/L (ref <18)
Troponin I (High Sensitivity): 7 ng/L (ref <18)

## 2024-06-04 LAB — CBC
HCT: 47.7 % (ref 39.0–52.0)
Hemoglobin: 16.3 g/dL (ref 13.0–17.0)
MCH: 28.3 pg (ref 26.0–34.0)
MCHC: 34.2 g/dL (ref 30.0–36.0)
MCV: 83 fL (ref 80.0–100.0)
Platelets: 270 K/uL (ref 150–400)
RBC: 5.75 MIL/uL (ref 4.22–5.81)
RDW: 12.5 % (ref 11.5–15.5)
WBC: 8.5 K/uL (ref 4.0–10.5)
nRBC: 0 % (ref 0.0–0.2)

## 2024-06-04 LAB — LIPASE, BLOOD: Lipase: 50 U/L (ref 11–51)

## 2024-06-04 LAB — D-DIMER, QUANTITATIVE: D-Dimer, Quant: 0.33 ug{FEU}/mL (ref 0.00–0.50)

## 2024-06-04 LAB — ETHANOL: Alcohol, Ethyl (B): <15 mg/dL (ref <15)

## 2024-06-04 MED ORDER — LIDOCAINE VISCOUS HCL 2 % MT SOLN
15.0000 mL | Freq: Once | OROMUCOSAL | Status: DC
Start: 2024-06-04 — End: 2024-06-05
  Filled 2024-06-04: qty 15

## 2024-06-04 MED ORDER — PANTOPRAZOLE SODIUM 40 MG IV SOLR
40.0000 mg | Freq: Once | INTRAVENOUS | Status: AC
  Administered 2024-06-04: 40 mg via INTRAVENOUS
  Filled 2024-06-04: qty 10

## 2024-06-04 MED ORDER — ACETAMINOPHEN 500 MG PO TABS
1000.0000 mg | ORAL_TABLET | Freq: Once | ORAL | Status: AC
  Administered 2024-06-04: 1000 mg via ORAL
  Filled 2024-06-04: qty 2

## 2024-06-04 MED ORDER — ALUM & MAG HYDROXIDE-SIMETH 200-200-20 MG/5ML PO SUSP
30.0000 mL | Freq: Once | ORAL | Status: DC
  Filled 2024-06-04: qty 30

## 2024-06-04 MED ORDER — LACTATED RINGERS IV BOLUS
1000.0000 mL | Freq: Once | INTRAVENOUS | Status: AC
  Administered 2024-06-04: 1000 mL via INTRAVENOUS

## 2024-06-04 MED ORDER — ONDANSETRON HCL 4 MG/2ML IJ SOLN
4.0000 mg | Freq: Once | INTRAMUSCULAR | Status: AC
  Administered 2024-06-04: 4 mg via INTRAVENOUS
  Filled 2024-06-04: qty 2

## 2024-06-04 MED ORDER — DEXTROSE 10 % IV SOLN: INTRAVENOUS | Status: DC

## 2024-06-04 NOTE — ED Notes (Signed)
 PT BG 69 GIVEN 2 JUICE AND SANDWICH PT ABLE TO FEED SELF

## 2024-06-04 NOTE — ED Notes (Signed)
 Report given to chandler RN

## 2024-06-04 NOTE — ED Provider Notes (Signed)
 [place] EMERGENCY DEPARTMENT AT [facility] Provider Note   CSN: [id number] Arrival date & time: 06/04/24  1403     History  Chief Complaint  Patient presents with   Hypoglycemia         [name] [name] is a 38 y.o. male with PMH as listed below who presents via EMS from [facility] and reported hypoglycemia, per EMS 79. He states his blood sugar keeps dropping despite eating. Pt also report withdrawal from meth and EtOH, last drinks this AM. Pt also reports central chest pain that feels burning and elevated HR. Denies nausea/vomiting, SOB, cough, leg swelling. Frequently presents for hyperglycemia; hasn't taken too much insulin  he reports.   Past Medical History:  Diagnosis Date   Diabetes mellitus without complication (HCC)    DKA (diabetic ketoacidosis) (HCC) 05/08/2022   HTN (hypertension) 05/08/2022   Hyperlipidemia    Hyperosmolar hyperglycemic state (HHS) (HCC) 05/08/2022   Hypertension    Hypoglycemia 07/02/2019   Pseudohyponatremia 05/08/2022       Home Medications Prior to Admission medications   Medication Sig Start Date End Date Taking? Authorizing Provider  amLODipine  (NORVASC ) 10 MG tablet Take 1 tablet (10 mg total) by mouth daily. 04/06/24 06/05/24  Leotis Bogus, MD  hydrALAZINE  (APRESOLINE ) 25 MG tablet Take 1 tablet (25 mg total) by mouth every 8 (eight) hours. Patient not taking: Reported on 05/24/2024 04/05/24 05/24/24  Leotis Bogus, MD  insulin  lispro (HUMALOG ) 100 UNIT/ML KwikPen Inject 8 Units into the skin 3 (three) times daily with meals. Only take if eating a meal AND Blood Glucose (BG) is 80 or higher. 05/05/24   Vernon Ranks, MD  Insulin  Pen Needle (PEN NEEDLES) 31G X 5 MM MISC Use  three times daily 05/05/24   Vernon Ranks, MD  Lancet Device MISC Use 3 (three) times daily. 05/05/24   Vernon Ranks, MD  Lancets MISC Use to check blood sugar three times daily 05/05/24   Vernon Ranks, MD  LANTUS  SOLOSTAR 100 UNIT/ML Solostar Pen Inject 40  Units into the skin at bedtime. Patient taking differently: Inject 30 Units into the skin at bedtime. 05/05/24 08/03/24  Vernon Ranks, MD  lisinopril  (ZESTRIL ) 5 MG tablet Take 5 mg by mouth daily.    [provider]      Allergies    Patient has no known allergies.    Review of Systems   Review of Systems A 10 point review of systems was performed and is negative unless otherwise reported in HPI.  Physical Exam Updated Vital Signs BP (!) 141/101   Pulse (!) 122   Temp 97.7 F (36.5 C) (Oral)   Resp 15   Ht 5' 11 (1.803 m)   Wt 92.1 kg   SpO2 98%   BMI 28.31 kg/m  Physical Exam General: Sleepy male, lying in bed.  HEENT: PERRLA, Sclera anicteric, MMM, trachea midline.  Cardiology: Regular tachycardic rate, no murmurs/rubs/gallops Resp: Normal respiratory rate and effort. CTAB, no wheezes, rhonchi, crackles.  Abd: Soft, non-tender, non-distended. No rebound tenderness or guarding.  GU: Deferred. MSK: No peripheral edema or signs of trauma. Extremities without deformity or TTP. No cyanosis or clubbing. Skin: warm, dry.  Neuro: Oriented x4, sleepy, CNs II-XII grossly intact. MAEs. Sensation grossly intact.  Psych: Normal mood and affect.   ED Results / Procedures / Treatments   Labs (all labs ordered are listed, but only abnormal results are displayed) Labs Reviewed  BASIC METABOLIC PANEL WITH GFR - Abnormal; Notable for the following  components:      Result Value   CO2 19 (*)    BUN 24 (*)    Creatinine, Ser 1.48 (*)    Calcium  8.6 (*)    All other components within normal limits  URINALYSIS, ROUTINE W REFLEX MICROSCOPIC - Abnormal; Notable for the following components:   Glucose, UA >=500 (*)    All other components within normal limits  CBG MONITORING, ED - Abnormal; Notable for the following components:   Glucose-Capillary 69 (*)    All other components within normal limits  CBG MONITORING, ED - Abnormal; Notable for the following components:    Glucose-Capillary 148 (*)    All other components within normal limits  CBG MONITORING, ED - Abnormal; Notable for the following components:   Glucose-Capillary 63 (*)    All other components within normal limits  CBG MONITORING, ED - Abnormal; Notable for the following components:   Glucose-Capillary 142 (*)    All other components within normal limits  CBG MONITORING, ED - Abnormal; Notable for the following components:   Glucose-Capillary 100 (*)    All other components within normal limits  CBC  ETHANOL  LIPASE, BLOOD  D-DIMER, QUANTITATIVE  CBG MONITORING, ED  TROPONIN I (HIGH SENSITIVITY)  TROPONIN I (HIGH SENSITIVITY)    EKG None  Radiology DG Chest 2 View Result Date: 06/04/2024 EXAM: 2 VIEW(S) XRAY OF THE CHEST 06/04/2024 02:42:00 PM COMPARISON: 05/24/2024 CLINICAL HISTORY: Chest pain. FINDINGS: LUNGS AND PLEURA: No focal pulmonary opacity. No pulmonary edema. No pleural effusion. No pneumothorax. HEART AND MEDIASTINUM: No acute abnormality of the cardiac and mediastinal silhouettes. BONES AND SOFT TISSUES: No acute osseous abnormality. IMPRESSION: 1. No acute cardiopulmonary findings. Electronically signed by: Shahmeer Lateef MD 06/04/2024 03:22 PM EST RP Workstation: [id number]    Procedures Procedures    Medications Ordered in ED Medications  lactated ringers  bolus 1,000 mL (0 mLs Intravenous Stopped 06/04/24 2359)  ondansetron  (ZOFRAN ) injection 4 mg (4 mg Intravenous Given 06/04/24 1807)  acetaminophen  (TYLENOL ) tablet 1,000 mg (1,000 mg Oral Given 06/04/24 2304)  pantoprazole (PROTONIX) injection 40 mg (40 mg Intravenous Given 06/04/24 2308)  lactated ringers  bolus 1,000 mL (0 mLs Intravenous Stopped 06/05/24 0118)    ED Course/ Medical Decision Making/ A&P                          Medical Decision Making Amount and/or Complexity of Data Reviewed Labs: ordered. Decision-making details documented in ED Course.  Risk OTC drugs. Prescription drug  management.    This patient presents to the ED for concern of hypoglycemia, tachycardia, substance use/withdrawal, this involves an extensive number of treatment options, and is a complaint that carries with it a high risk of complications and morbidity.  I considered the following differential and admission for this acute, potentially life threatening condition. He is mildly tachycardic, mildly hypertensive but otherwise vitally stable.   MDM:    Sinus tachycardia on the monitor into 120s bpm. No ACS, EKG w/o signs of ischemia and troponin neg. No SOB/hypoxia/signs/sxs of DVT, and dimer is neg, lower c/f PE. Consider alcohol withdrawal and he is mildly hypertensive, though he has no shaking/tongue fasciculations, nausea/vomiting, anxiety and he is actually mildly somnolent. His CIWA for sxs is actually 0 at this time. Lower c/f acute alcohol withdrawal at this time. Consider concurrent stimulant use or stimulant withdrawal as well. Consider dehydration and he is given IVF with some improvement in HR. He reports burning central  CP which in context of alcohol use could be gastritis/esophagitis so ordered protonix and then GI cocktail but then patient reported no further CP so wasn't given GI cocktail. Low c/f pericarditis/myocarditis. Neg lipase, no epigastric TTP to indicate pancreatitis. No significant electrolyte derangements/renal injury. Per chart review he presents frequently for hyperglycemia and actually was recently admitted in september for the same. His BG does drop mildly while he is here despite PO intake so given some time on D10 gtt which improves sugar. He remains stable off of it. Will finish fluids and check glucose again to ensure no further dropping off of D10, and patient can likely be DC'd.   Clinical Course as of 06/08/24 0815  Fri Jun 04, 2024  1651 Glucose: 70 [HN]  1652 CBC wnl [HN]  2006 Troponin I (High Sensitivity): 7 neg [HN]  2114 D-Dimer, Quant: 0.33 Neg d-dimer [HN]   2114 Troponin I (High Sensitivity): 7 Neg/flat trop [HN]  2114 Lipase: 50 Neg lipase [HN]  2344 Glucose-Capillary: 96 [HN]  Sat Jun 05, 2024  0007 Pulse Rate: 99 [HN]    Clinical Course User Index [HN] Franklyn Sid SAILOR, MD    Labs: I Ordered, and personally interpreted labs.  The pertinent results include:  those listed above  Imaging Studies ordered: I ordered imaging studies including CXR I independently visualized and interpreted imaging. I agree with the radiologist interpretation  Additional history obtained from chart review.    Cardiac Monitoring: The patient was maintained on a cardiac monitor.  I personally viewed and interpreted the cardiac monitored which showed an underlying rhythm of: sinus tachycardia  Reevaluation: After the interventions noted above, I reevaluated the patient and found that they have :improved  Social Determinants of Health: Lives independently  Disposition:  Patient is signed out to the oncoming ED PA Sponsellar who is made aware of his history, presentation, exam, workup, and plan. Plan is to finish fluids, recheck HR, and check additional glucose to ensure no further hypoglycemia, then likely DC.   Co morbidities that complicate the patient evaluation  Past Medical History:  Diagnosis Date   Diabetes mellitus without complication (HCC)    DKA (diabetic ketoacidosis) (HCC) 05/08/2022   HTN (hypertension) 05/08/2022   Hyperlipidemia    Hyperosmolar hyperglycemic state (HHS) (HCC) 05/08/2022   Hypertension    Hypoglycemia 07/02/2019   Pseudohyponatremia 05/08/2022     Medicines No orders of the defined types were placed in this encounter.   I have reviewed the patients home medicines and have made adjustments as needed  Problem List / ED Course: Problem List Items Addressed This Visit   None Visit Diagnoses       Hypoglycemia    -  Primary                   This note was created using dictation software, which  may contain spelling or grammatical errors.    Franklyn Sid SAILOR, MD 06/08/24 [phone]

## 2024-06-04 NOTE — ED Notes (Signed)
 Still awaiting provider to bedside to eval

## 2024-06-04 NOTE — ED Triage Notes (Signed)
 Pt arrived via EMS from [facility] and reported hypoglycemia, per EMS 79. Pt also report withdrawal from meth and etoh last drink 2 days ago. Pt also reports CP and elevated HR

## 2024-06-05 ENCOUNTER — Encounter (HOSPITAL_COMMUNITY): Payer: Self-pay

## 2024-06-05 DIAGNOSIS — F339 Major depressive disorder, recurrent, unspecified: Secondary | ICD-10-CM | POA: Diagnosis not present

## 2024-06-05 LAB — CBG MONITORING, ED: Glucose-Capillary: 100 mg/dL — ABNORMAL HIGH (ref 70–99)

## 2024-06-05 NOTE — Discharge Instructions (Addendum)
 You were seen in the ER today for your low blood sugar. This improved with eating in the ER. Follow up with your PCP and return to the ER with any new severe symptoms.

## 2024-06-05 NOTE — ED Notes (Signed)
 PT woke and asked for a sandwich and some water .

## 2024-06-05 NOTE — ED Provider Notes (Signed)
  Physical Exam  BP (!) 159/99 (BP Location: Left Arm)   Pulse 98   Temp (!) 97.3 F (36.3 C) (Axillary)   Resp (!) 22   Ht 5' 11 (1.803 m)   Wt 92.1 kg   SpO2 100%   BMI 28.31 kg/m   Physical Exam  Procedures  Procedures  ED Course / MDM   Clinical Course as of 06/05/24 0701  Fri Jun 04, 2024  1651 Glucose: 70 [HN]  1652 CBC wnl [HN]  2006 Troponin I (High Sensitivity): 7 neg [HN]  2114 D-Dimer, Quant: 0.33 Neg d-dimer [HN]  2114 Troponin I (High Sensitivity): 7 Neg/flat trop [HN]  2114 Lipase: 50 Neg lipase [HN]  2344 Glucose-Capillary: 96 [HN]  Sat Jun 05, 2024  0007 Pulse Rate: 99 [HN]    Clinical Course User Index [HN] Franklyn Sid SAILOR, MD   Medical Decision Making Amount and/or Complexity of Data Reviewed Labs: ordered. Decision-making details documented in ED Course.  Risk OTC drugs. Prescription drug management.   .  Patient was seen from preceding ED provider at time of shift change.  Please see her associated note for further insight to the patient the course.  Who presents with concern for sensation of low blood sugar for which he called EMS to [facility].  Was found to have sugars of 79 also reporting he is withdrawing from methamphetamine and alcohol.  Plan at time of shift change was for further metabolization of patient's substances due to clinical concern for intoxication on intake.  Patient observed in the emergency department with resolution of his altered mental status, reassuring vital signs at this time, tolerating p.o. and ambulatory in the ED.  Focal concern for emergent underlying condition that would warrant further ED workup or inpatient management is exceedingly low.  This chart was dictated using voice recognition software, Dragon. Despite the best efforts of this provider to proofread and correct errors, errors may still occur which can change documentation meaning.      Bobette Pleasant SAUNDERS, PA-C 06/05/24 0701    Griselda Norris, MD 06/05/24 [phone]

## 2024-06-09 ENCOUNTER — Emergency Department (HOSPITAL_COMMUNITY)

## 2024-06-09 ENCOUNTER — Emergency Department (HOSPITAL_COMMUNITY)
Admission: EM | Admit: 2024-06-09 | Discharge: 2024-06-09 | Disposition: A | Discharge status: Home / Self Care | Attending: Emergency Medicine | Admitting: Emergency Medicine

## 2024-06-09 ENCOUNTER — Other Ambulatory Visit: Payer: Self-pay

## 2024-06-09 ENCOUNTER — Encounter (HOSPITAL_COMMUNITY): Payer: Self-pay | Admitting: Emergency Medicine

## 2024-06-09 DIAGNOSIS — R7989 Other specified abnormal findings of blood chemistry: Secondary | ICD-10-CM | POA: Diagnosis not present

## 2024-06-09 DIAGNOSIS — I10 Essential (primary) hypertension: Secondary | ICD-10-CM | POA: Insufficient documentation

## 2024-06-09 DIAGNOSIS — Z79899 Other long term (current) drug therapy: Secondary | ICD-10-CM | POA: Diagnosis not present

## 2024-06-09 DIAGNOSIS — E11649 Type 2 diabetes mellitus with hypoglycemia without coma: Secondary | ICD-10-CM | POA: Insufficient documentation

## 2024-06-09 DIAGNOSIS — Z794 Long term (current) use of insulin: Secondary | ICD-10-CM | POA: Diagnosis not present

## 2024-06-09 DIAGNOSIS — F339 Major depressive disorder, recurrent, unspecified: Secondary | ICD-10-CM | POA: Diagnosis not present

## 2024-06-09 DIAGNOSIS — R000 Tachycardia, unspecified: Secondary | ICD-10-CM | POA: Diagnosis not present

## 2024-06-09 DIAGNOSIS — R079 Chest pain, unspecified: Secondary | ICD-10-CM | POA: Diagnosis not present

## 2024-06-09 LAB — CBC WITH DIFFERENTIAL/PLATELET
Abs Immature Granulocytes: 0.02 K/uL (ref 0.00–0.07)
Basophils Absolute: 0.1 K/uL (ref 0.0–0.1)
Basophils Relative: 1 %
Eosinophils Absolute: 0.1 K/uL (ref 0.0–0.5)
Eosinophils Relative: 1 %
HCT: 47.5 % (ref 39.0–52.0)
Hemoglobin: 16.1 g/dL (ref 13.0–17.0)
Immature Granulocytes: 0 %
Lymphocytes Relative: 33 %
Lymphs Abs: 3.2 K/uL (ref 0.7–4.0)
MCH: 27.9 pg (ref 26.0–34.0)
MCHC: 33.9 g/dL (ref 30.0–36.0)
MCV: 82.3 fL (ref 80.0–100.0)
Monocytes Absolute: 0.8 K/uL (ref 0.1–1.0)
Monocytes Relative: 9 %
Neutro Abs: 5.5 K/uL (ref 1.7–7.7)
Neutrophils Relative %: 56 %
Platelets: 299 K/uL (ref 150–400)
RBC: 5.77 MIL/uL (ref 4.22–5.81)
RDW: 12.6 % (ref 11.5–15.5)
WBC: 9.7 K/uL (ref 4.0–10.5)
nRBC: 0 % (ref 0.0–0.2)

## 2024-06-09 LAB — CBG MONITORING, ED
Glucose-Capillary: 90 mg/dL (ref 70–99)
Glucose-Capillary: 94 mg/dL (ref 70–99)
Glucose-Capillary: 193 mg/dL — ABNORMAL HIGH (ref 70–99)

## 2024-06-09 LAB — COMPREHENSIVE METABOLIC PANEL WITH GFR
ALT: 23 U/L (ref 0–44)
AST: 24 U/L (ref 15–41)
Albumin: 4.3 g/dL (ref 3.5–5.0)
Alkaline Phosphatase: 91 U/L (ref 38–126)
Anion gap: 13 (ref 5–15)
BUN: 36 mg/dL — ABNORMAL HIGH (ref 6–20)
CO2: 25 mmol/L (ref 22–32)
Calcium: 10.4 mg/dL — ABNORMAL HIGH (ref 8.9–10.3)
Chloride: 98 mmol/L (ref 98–111)
Creatinine, Ser: 1.41 mg/dL — ABNORMAL HIGH (ref 0.61–1.24)
GFR, Estimated: >60 mL/min (ref >60)
Glucose, Bld: 60 mg/dL — ABNORMAL LOW (ref 70–99)
Potassium: 3.7 mmol/L (ref 3.5–5.1)
Sodium: 136 mmol/L (ref 135–145)
Total Bilirubin: 0.4 mg/dL (ref 0.0–1.2)
Total Protein: 7.9 g/dL (ref 6.5–8.1)

## 2024-06-09 LAB — TROPONIN T, HIGH SENSITIVITY
Troponin T High Sensitivity: 21 ng/L — ABNORMAL HIGH (ref 0–19)
Troponin T High Sensitivity: 21 ng/L — ABNORMAL HIGH (ref 0–19)

## 2024-06-09 NOTE — ED Notes (Signed)
 Gave patient another drink. No complaints at this time.

## 2024-06-09 NOTE — Discharge Instructions (Signed)
 You were seen in the emergency department today for concerns of low blood sugar.  Your labs and imaging were thankfully reassuring and your glucose levels were able to stabilize.  You did have an elevation in your troponin levels on both checks that we had done on you which may be due to a variety of issues and given that you are not having significant chest pain and your heart rate was slightly elevated, I suspect this is likely because.  Avoid using any substances as these can cause damage to your heart.  For any concerns of new or worsening symptoms, return to the emergency department.

## 2024-06-09 NOTE — ED Triage Notes (Addendum)
 Pt arrives w/ [facility] w/ c/o hypoglycemia. Found outside. CBG 69 upon arrival. 1 tube of oral glucose given en route. CBG after 89. Hx Type 2 diabetes.  All other VSS  Pt reports feeling lightheaded at this moment.

## 2024-06-09 NOTE — ED Notes (Signed)
 Patient sleeping at this time.

## 2024-06-09 NOTE — ED Provider Notes (Signed)
 [place] EMERGENCY DEPARTMENT AT [facility] Provider Note   CSN: [id number] Arrival date & time: 06/09/24  0118     Patient presents with: Hypoglycemia   [name] [name] is a 38 y.o. male.  Patient with past history significant for type 2 diabetes, HHS, amphetamine abuse, and substance use disorder presents to the emergency department with concerns of hyperglycemia.  Reportedly on after glucose level by EMS was found to be 69 patient was found outside.  Questionable history for housing instability.  Was given oral glucose and route by EMS with improvement in CBG up to 89.  He reports that he has been compliant with his home medications and has had regular access to food and drink.   Hypoglycemia      Prior to Admission medications   Medication Sig Start Date End Date Taking? Authorizing Provider  amLODipine  (NORVASC ) 10 MG tablet Take 1 tablet (10 mg total) by mouth daily. 04/06/24 06/09/24 Yes Leotis Bogus, MD  insulin  lispro (HUMALOG ) 100 UNIT/ML KwikPen Inject 8 Units into the skin 3 (three) times daily with meals. Only take if eating a meal AND Blood Glucose (BG) is 80 or higher. 05/05/24  Yes Pahwani, Fredia, MD  LANTUS  SOLOSTAR 100 UNIT/ML Solostar Pen Inject 40 Units into the skin at bedtime. Patient taking differently: Inject 20 Units into the skin at bedtime. 05/05/24 08/03/24 Yes Vernon Fredia, MD  hydrALAZINE  (APRESOLINE ) 25 MG tablet Take 1 tablet (25 mg total) by mouth every 8 (eight) hours. Patient not taking: Reported on 05/24/2024 04/05/24 05/24/24  Leotis Bogus, MD  Insulin  Pen Needle (PEN NEEDLES) 31G X 5 MM MISC Use  three times daily 05/05/24   Vernon Fredia, MD  Lancet Device MISC Use 3 (three) times daily. 05/05/24   Vernon Fredia, MD  Lancets MISC Use to check blood sugar three times daily 05/05/24   Vernon Fredia, MD    Allergies: Patient has no known allergies.    Review of Systems  Constitutional:  Positive for fatigue.  All other systems reviewed  and are negative.   Updated Vital Signs BP (!) 158/98 (BP Location: Right Arm)   Pulse (!) 105   Temp 98.2 F (36.8 C) (Oral)   Resp 18   SpO2 100%   Physical Exam Vitals and nursing note reviewed.  Constitutional:      General: He is not in acute distress.    Appearance: He is well-developed.  HENT:     Head: Normocephalic and atraumatic.  Eyes:     Conjunctiva/sclera: Conjunctivae normal.  Cardiovascular:     Rate and Rhythm: Normal rate and regular rhythm.     Heart sounds: No murmur heard. Pulmonary:     Effort: Pulmonary effort is normal. No respiratory distress.     Breath sounds: Normal breath sounds. No wheezing or rales.  Abdominal:     General: Abdomen is flat. Bowel sounds are normal. There is no distension.     Palpations: Abdomen is soft.     Tenderness: There is no abdominal tenderness. There is no guarding.  Musculoskeletal:        General: No swelling.     Cervical back: Neck supple.  Skin:    General: Skin is warm and dry.     Capillary Refill: Capillary refill takes less than 2 seconds.  Neurological:     Mental Status: He is alert.  Psychiatric:        Mood and Affect: Mood normal.     (all labs  ordered are listed, but only abnormal results are displayed) Labs Reviewed  COMPREHENSIVE METABOLIC PANEL WITH GFR - Abnormal; Notable for the following components:      Result Value   Glucose, Bld 60 (*)    BUN 36 (*)    Creatinine, Ser 1.41 (*)    Calcium  10.4 (*)    All other components within normal limits  CBG MONITORING, ED - Abnormal; Notable for the following components:   Glucose-Capillary 193 (*)    All other components within normal limits  TROPONIN T, HIGH SENSITIVITY - Abnormal; Notable for the following components:   Troponin T High Sensitivity 21 (*)    All other components within normal limits  TROPONIN T, HIGH SENSITIVITY - Abnormal; Notable for the following components:   Troponin T High Sensitivity 21 (*)    All other components  within normal limits  CBC WITH DIFFERENTIAL/PLATELET  CBG MONITORING, ED  CBG MONITORING, ED    EKG: EKG Interpretation Date/Time:  Wednesday June 09 2024 02:02:02 EST Ventricular Rate:  105 PR Interval:  128 QRS Duration:  109 QT Interval:  350 QTC Calculation: 463 R Axis:   -57  Text Interpretation: Sinus tachycardia Left axis deviation No significant change was found Confirmed by Trine Likes [phone]) on 06/09/2024 5:03:18 AM  Radiology: DG Chest Portable 1 View Result Date: 06/09/2024 EXAM: 1 VIEW(S) XRAY OF THE CHEST 06/09/2024 02:09:00 AM COMPARISON: 06/04/2024 CLINICAL HISTORY: Chest pain FINDINGS: LUNGS AND PLEURA: No focal pulmonary opacity. No pulmonary edema. No pleural effusion. No pneumothorax. HEART AND MEDIASTINUM: No acute abnormality of the cardiac and mediastinal silhouettes. BONES AND SOFT TISSUES: No acute osseous abnormality. IMPRESSION: 1. No acute cardiopulmonary process. Electronically signed by: Pinkie Pebbles MD 06/09/2024 02:15 AM EST RP Workstation: [id number]     Procedures   Medications Ordered in the ED - No data to display                                  Medical Decision Making Amount and/or Complexity of Data Reviewed Labs: ordered. Radiology: ordered.   This patient presents to the ED for concern of hypoglycemia, this involves an extensive number of treatment options, and is a complaint that carries with it a high risk of complications and morbidity.  The differential diagnosis includes insulin  overdose, malnutrition, dehydration   Co morbidities that complicate the patient evaluation  Type 2 diabetes, substance use disorder, hypertension, acute metabolic encephalopathy, and vitamin abuse   Additional history obtained:  Additional history obtained from chart review   Lab Tests:  I Ordered, and personally interpreted labs.  The pertinent results include: CBC unremarkable, CMP with hyperglycemia with glucose at 60 but repeat  labs show glucose improved to 90, 93, and 193 after oral intake.  Troponin at 21 x 2.  Suspect possible substance use given patient's persistent tachycardia here in the emergency department.   Imaging Studies ordered:  I ordered imaging studies including chest x-ray I independently visualized and interpreted imaging which showed no acute cardiopulmonary process I agree with the radiologist interpretation   Cardiac Monitoring: / EKG:  The patient was maintained on a cardiac monitor.  I personally viewed and interpreted the cardiac monitored which showed an underlying rhythm of: Sinus tachycardia   Consultations Obtained:  I requested consultation with none,  and discussed lab and imaging findings as well as pertinent plan - they recommend: N/A   Problem List / ED Course /  Critical interventions / Medication management  Patient presented the emergency department with concerns of hypoglycemia.  Reportedly brought in by EMS with initial glucose level showing he was at 69.  Given oral glucose and route with improvement to 89.  Patient has a history of type 2 diabetes on combination of short and long-term insulin .  He reports feelings of lightheadedness but denies any other acute concerns.  He hallucinated reports some central chest pain but denies any shortness of breath.  Denies any nausea, vomiting, diaphoresis. On exam, patient has no abnormal heart or lung sounds.  He is otherwise well-appearing and interacting appropriately.  Will maintain patient for evaluation to ensure no recurrent hypoglycemia.  Suspect possible malnutrition versus overuse of insulin .  Troponin added on for assessment of chest pain.  EKG is nonischemic and shows sinus tachycardia. Lab workup largely reassuring.  Glucose appears to be stabilizing with repeat CBG shows glucose at 90, 93, and 193.  Patient has been given oral intake including sandwich and juice.  Troponin is elevated at 21 with repeat remaining stable at 21.   Suspect possible substance use or patient will not elaborate further or any recent substance use.  Given patient is not febrile and has no white count elevation, do not feel that tachycardia is currently indicative of infectious etiology.  Patient is otherwise stable and has no other acute concerns at this time.  Discharged at this time for outpatient follow-up. I have reviewed the patients home medicines and have made adjustments as needed   Social Determinants of Health:  None   Test / Admission - Considered:  Considered but stable for outpatient follow-up.  Final diagnoses:  Hypoglycemia associated with type 2 diabetes mellitus [facility])  Elevated troponin    ED Discharge Orders     None          Cecily Legrand LABOR, PA-C 06/09/24 0723    Trine Raynell Moder, MD 06/10/24 [phone]

## 2024-06-09 NOTE — ED Notes (Signed)
 Patient given graham crackers and peanut butter with sprite.

## 2024-06-10 DIAGNOSIS — F339 Major depressive disorder, recurrent, unspecified: Secondary | ICD-10-CM | POA: Diagnosis not present

## 2024-06-10 MED FILL — Sodium Chloride IV Soln 0.9%: INTRAVENOUS | Qty: 1000 | Status: AC

## 2024-06-11 DIAGNOSIS — F339 Major depressive disorder, recurrent, unspecified: Secondary | ICD-10-CM | POA: Diagnosis not present

## 2024-06-16 DIAGNOSIS — Z794 Long term (current) use of insulin: Secondary | ICD-10-CM | POA: Insufficient documentation

## 2024-06-16 DIAGNOSIS — I10 Essential (primary) hypertension: Secondary | ICD-10-CM | POA: Insufficient documentation

## 2024-06-16 DIAGNOSIS — E1165 Type 2 diabetes mellitus with hyperglycemia: Secondary | ICD-10-CM | POA: Insufficient documentation

## 2024-06-16 DIAGNOSIS — F339 Major depressive disorder, recurrent, unspecified: Secondary | ICD-10-CM | POA: Diagnosis not present

## 2024-06-16 DIAGNOSIS — R42 Dizziness and giddiness: Secondary | ICD-10-CM | POA: Diagnosis not present

## 2024-06-16 DIAGNOSIS — R739 Hyperglycemia, unspecified: Secondary | ICD-10-CM | POA: Diagnosis present

## 2024-06-17 ENCOUNTER — Emergency Department (HOSPITAL_COMMUNITY)
Admission: EM | Admit: 2024-06-17 | Discharge: 2024-06-17 | Disposition: A | Discharge status: Home / Self Care | Attending: Emergency Medicine | Admitting: Emergency Medicine

## 2024-06-17 ENCOUNTER — Other Ambulatory Visit: Payer: Self-pay

## 2024-06-17 ENCOUNTER — Encounter (HOSPITAL_COMMUNITY): Payer: Self-pay

## 2024-06-17 DIAGNOSIS — R739 Hyperglycemia, unspecified: Secondary | ICD-10-CM

## 2024-06-17 LAB — CBG MONITORING, ED
Glucose-Capillary: 103 mg/dL — ABNORMAL HIGH (ref 70–99)
Glucose-Capillary: 147 mg/dL — ABNORMAL HIGH (ref 70–99)
Glucose-Capillary: 180 mg/dL — ABNORMAL HIGH (ref 70–99)
Glucose-Capillary: 184 mg/dL — ABNORMAL HIGH (ref 70–99)
Glucose-Capillary: 417 mg/dL — ABNORMAL HIGH (ref 70–99)
Glucose-Capillary: 491 mg/dL — ABNORMAL HIGH (ref 70–99)
Glucose-Capillary: 575 mg/dL (ref 70–99)

## 2024-06-17 LAB — URINALYSIS, ROUTINE W REFLEX MICROSCOPIC
Bacteria, UA: NONE SEEN
Bilirubin Urine: NEGATIVE
Glucose, UA: >=500 mg/dL — AB
Hgb urine dipstick: NEGATIVE
Ketones, ur: NEGATIVE mg/dL
Leukocytes,Ua: NEGATIVE
Nitrite: NEGATIVE
Protein, ur: NEGATIVE mg/dL
Specific Gravity, Urine: 1.029 (ref 1.005–1.030)
pH: 6 (ref 5.0–8.0)

## 2024-06-17 LAB — CBC
HCT: 43 % (ref 39.0–52.0)
Hemoglobin: 14.2 g/dL (ref 13.0–17.0)
MCH: 28.9 pg (ref 26.0–34.0)
MCHC: 33 g/dL (ref 30.0–36.0)
MCV: 87.6 fL (ref 80.0–100.0)
Platelets: 227 K/uL (ref 150–400)
RBC: 4.91 MIL/uL (ref 4.22–5.81)
RDW: 12.7 % (ref 11.5–15.5)
WBC: 4.4 K/uL (ref 4.0–10.5)
nRBC: 0 % (ref 0.0–0.2)

## 2024-06-17 LAB — I-STAT CHEM 8, ED
BUN: 12 mg/dL (ref 6–20)
Calcium, Ion: 1.15 mmol/L (ref 1.15–1.40)
Chloride: 99 mmol/L (ref 98–111)
Creatinine, Ser: 1.3 mg/dL — ABNORMAL HIGH (ref 0.61–1.24)
Glucose, Bld: 513 mg/dL (ref 70–99)
HCT: 42 % (ref 39.0–52.0)
Hemoglobin: 14.3 g/dL (ref 13.0–17.0)
Potassium: 4.2 mmol/L (ref 3.5–5.1)
Sodium: 136 mmol/L (ref 135–145)
TCO2: 25 mmol/L (ref 22–32)

## 2024-06-17 MED ORDER — INSULIN REGULAR(HUMAN) IN NACL 100-0.9 UT/100ML-% IV SOLN
INTRAVENOUS | Status: DC
  Administered 2024-06-17: 10.5 [IU]/h via INTRAVENOUS
  Filled 2024-06-17: qty 100

## 2024-06-17 MED ORDER — LACTATED RINGERS IV SOLN: INTRAVENOUS | Status: DC

## 2024-06-17 MED ORDER — DEXTROSE IN LACTATED RINGERS 5 % IV SOLN: INTRAVENOUS | Status: DC

## 2024-06-17 MED ORDER — DEXTROSE 50 % IV SOLN: 0.0000 mL | INTRAVENOUS | Status: DC | PRN

## 2024-06-17 MED ORDER — SODIUM CHLORIDE 0.9 % IV BOLUS (SEPSIS)
1000.0000 mL | Freq: Once | INTRAVENOUS | Status: AC
  Administered 2024-06-17: 1000 mL via INTRAVENOUS

## 2024-06-17 MED ORDER — INSULIN LISPRO (1 UNIT DIAL) 100 UNIT/ML (KWIKPEN): 8.0000 [IU] | PEN_INJECTOR | Freq: Three times a day (TID) | SUBCUTANEOUS | 11 refills | Status: DC

## 2024-06-17 MED ORDER — LANTUS SOLOSTAR 100 UNIT/ML ~~LOC~~ SOPN: 20.0000 [IU] | PEN_INJECTOR | Freq: Every day | SUBCUTANEOUS | 11 refills | Status: DC

## 2024-06-17 NOTE — ED Provider Notes (Signed)
 [place] EMERGENCY DEPARTMENT AT [facility] Provider Note   CSN: [id number] Arrival date & time: 06/16/24  2358     Patient presents with: Hyperglycemia   [name] [name] is a 38 y.o. male.   The history is provided by the patient.  Patient with history of diabetes, hypertension presents with hyperglycemia.  Patient was at a [employer]undromat when he fell like his glucose was low and asked for help. EMS reports he appeared lethargic.  Patient reports he just ran out of his insulin .  Glucose was noted to be over 500  Patient reports generalized fatigue and feeling dehydrated.  He denies any pain complaints    Past Medical History:  Diagnosis Date   Diabetes mellitus without complication (HCC)    DKA (diabetic ketoacidosis) (HCC) 05/08/2022   HTN (hypertension) 05/08/2022   Hyperlipidemia    Hyperosmolar hyperglycemic state (HHS) (HCC) 05/08/2022   Hypertension    Hypoglycemia 07/02/2019   Pseudohyponatremia 05/08/2022    Prior to Admission medications   Medication Sig Start Date End Date Taking? Authorizing Provider  insulin  glargine (LANTUS  SOLOSTAR) 100 UNIT/ML Solostar Pen Inject 20 Units into the skin daily. 06/17/24  Yes Midge Golas, MD  insulin  lispro (HUMALOG  KWIKPEN) 100 UNIT/ML KwikPen Inject 8 Units into the skin 3 (three) times daily. 06/17/24  Yes Midge Golas, MD  amLODipine  (NORVASC ) 10 MG tablet Take 1 tablet (10 mg total) by mouth daily. 04/06/24 06/09/24  Leotis Bogus, MD  hydrALAZINE  (APRESOLINE ) 25 MG tablet Take 1 tablet (25 mg total) by mouth every 8 (eight) hours. Patient not taking: Reported on 05/24/2024 04/05/24 05/24/24  Leotis Bogus, MD  Insulin  Pen Needle (PEN NEEDLES) 31G X 5 MM MISC Use  three times daily 05/05/24   Vernon Ranks, MD  Lancet Device MISC Use 3 (three) times daily. 05/05/24   Vernon Ranks, MD  Lancets MISC Use to check blood sugar three times daily 05/05/24   Vernon Ranks, MD    Allergies: Patient has no known  allergies.    Review of Systems  Constitutional:  Positive for fatigue.  Cardiovascular:  Negative for chest pain.  Gastrointestinal:  Negative for abdominal pain and vomiting.    Updated Vital Signs BP (!) 145/102   Pulse 80   Temp 97.8 F (36.6 C) (Axillary)   Resp 17   Ht 1.803 m (5' 11)   Wt 92.1 kg   SpO2 100%   BMI 28.32 kg/m   Physical Exam CONSTITUTIONAL disheveled, no acute distress, does not smell of ketones HEAD: Normocephalic/atraumatic EYES: EOMI ENMT: Mucous membranes dry NECK: supple no meningeal signs SPINE/BACK:entire spine nontender CV: S1/S2 noted, no murmurs/rubs/gallops noted LUNGS: Lungs are clear to auscultation bilaterally, no apparent distress ABDOMEN: soft, nontender NEURO: Pt is awake/alert/appropriate, moves all extremitiesx4.  No facial droop.   EXTREMITIES: pulses normal/equal, full ROM SKIN: warm, color normal  (all labs ordered are listed, but only abnormal results are displayed) Labs Reviewed  URINALYSIS, ROUTINE W REFLEX MICROSCOPIC - Abnormal; Notable for the following components:      Result Value   Color, Urine STRAW (*)    Glucose, UA >=500 (*)    All other components within normal limits  CBG MONITORING, ED - Abnormal; Notable for the following components:   Glucose-Capillary 575 (*)    All other components within normal limits  CBG MONITORING, ED - Abnormal; Notable for the following components:   Glucose-Capillary 491 (*)    All other components within normal limits  I-STAT CHEM 8,  ED - Abnormal; Notable for the following components:   Creatinine, Ser 1.30 (*)    Glucose, Bld 513 (*)    All other components within normal limits  CBG MONITORING, ED - Abnormal; Notable for the following components:   Glucose-Capillary 417 (*)    All other components within normal limits  CBG MONITORING, ED - Abnormal; Notable for the following components:   Glucose-Capillary 180 (*)    All other components within normal limits  CBG  MONITORING, ED - Abnormal; Notable for the following components:   Glucose-Capillary 103 (*)    All other components within normal limits  CBG MONITORING, ED - Abnormal; Notable for the following components:   Glucose-Capillary 147 (*)    All other components within normal limits  CBG MONITORING, ED - Abnormal; Notable for the following components:   Glucose-Capillary 184 (*)    All other components within normal limits  CBC    EKG: EKG Interpretation Date/Time:  Thursday June 17 2024 00:54:48 EST Ventricular Rate:  76 PR Interval:  140 QRS Duration:  122 QT Interval:  409 QTC Calculation: 460 R Axis:   0  Text Interpretation: Sinus rhythm IVCD, consider atypical RBBB Confirmed by Midge Golas ([id number]) on 06/17/2024 1:01:47 AM  Radiology: No results found.   .Critical Care  Performed by: Midge Golas, MD Authorized by: Midge Golas, MD   Critical care provider statement:    Critical care time (minutes):  60   Critical care start time:  06/17/2024 1:00 AM   Critical care end time:  06/17/2024 2:00 AM   Critical care time was exclusive of:  Separately billable procedures and treating other patients   Critical care was necessary to treat or prevent imminent or life-threatening deterioration of the following conditions:  Metabolic crisis, dehydration and endocrine crisis   Critical care was time spent personally by me on the following activities:  Obtaining history from patient or surrogate, examination of patient, evaluation of patient's response to treatment, development of treatment plan with patient or surrogate, ordering and review of laboratory studies, re-evaluation of patient's condition and review of old charts   I assumed direction of critical care for this patient from another provider in my specialty: no      Medications Ordered in the ED  insulin  regular, human (MYXREDLIN ) 100 units/ 100 mL infusion (0 Units/hr Intravenous Stopped 06/17/24 0230)   lactated ringers  infusion (0 mLs Intravenous Stopped 06/17/24 0229)  dextrose  5 % in lactated ringers  infusion (0 mLs Intravenous Stopped 06/17/24 0250)  dextrose  50 % solution 0-50 mL (has no administration in time range)  sodium chloride  0.9 % bolus 1,000 mL (0 mLs Intravenous Stopped 06/17/24 0130)    Clinical Course as of 06/17/24 0522  Thu Jun 17, 2024  0521 Patient presented for hyperglycemia but no anion gap.  Patient was also dehydrated.  Patient was given IV fluids, IV insulin  and his glucose is stabilized.  Patient is in no acute distress at this time.  Patient has history of multiple ER visits and medication nonadherence and recently ran out of his meds.  He also reports he does not have a primary care provider.  At his request, I have represcribed Lantus  and NovoLog  Also given information for an outpatient PCP [DW]    Clinical Course User Index [DW] Midge Golas, MD  Medical Decision Making Amount and/or Complexity of Data Reviewed Labs: ordered. ECG/medicine tests: ordered.  Risk Prescription drug management.   This patient presents to the ED for concern of hyperglycemia, this involves an extensive number of treatment options, and is a complaint that carries with it a high risk of complications and morbidity.  The differential diagnosis includes but is not limited to medication nonadherence, diabetic ketoacidosis, lab error, HHS  Comorbidities that complicate the patient evaluation: Patient's presentation is complicated by their history of diabetes  Social Determinants of Health: Patient's lack of prescription access and frequent ER visits  increases the complexity of managing their presentation  Additional history obtained: Records reviewed previous admission documents  Lab Tests: I Ordered, and personally interpreted labs.  The pertinent results include: Hyperglycemia without anion gap, and renal insufficiency   Cardiac  Monitoring: The patient was maintained on a cardiac monitor.  I personally viewed and interpreted the cardiac monitor which showed an underlying rhythm of:  sinus rhythm  Medicines ordered and prescription drug management: I ordered medication including IV fluids and insulin  for hyperglycemia Reevaluation of the patient after these medicines showed that the patient    improved   Critical Interventions:   fluids and insulin    Reevaluation: After the interventions noted above, I reevaluated the patient and found that they have :improved  Complexity of problems addressed: Patient's presentation is most consistent with  acute presentation with potential threat to life or bodily function  Disposition: After consideration of the diagnostic results and the patient's response to treatment,  I feel that the patent would benefit from discharge  .        Final diagnoses:  Hyperglycemia    ED Discharge Orders          Ordered    insulin  lispro (HUMALOG  KWIKPEN) 100 UNIT/ML KwikPen  3 times daily        06/17/24 0521    insulin  glargine (LANTUS  SOLOSTAR) 100 UNIT/ML Solostar Pen  Daily        06/17/24 0521               Midge Golas, MD 06/17/24 [phone]

## 2024-06-17 NOTE — ED Triage Notes (Addendum)
 Pt BIB [facility] from [facility] for hyperglycemia. Pt felt like sugar was low and asked worker for help. Decreased LOC and lethargy per EMS. Pt states he took his insulin  today. Ascites and pitting edema to BLE, pupils constricted- nonreactive to light  reported by EMS.  598 CBG initially 1000 cc NS bolus

## 2024-06-18 DIAGNOSIS — F339 Major depressive disorder, recurrent, unspecified: Secondary | ICD-10-CM | POA: Diagnosis not present

## 2024-06-22 DIAGNOSIS — F339 Major depressive disorder, recurrent, unspecified: Secondary | ICD-10-CM | POA: Diagnosis not present

## 2024-06-25 DIAGNOSIS — F339 Major depressive disorder, recurrent, unspecified: Secondary | ICD-10-CM | POA: Diagnosis not present

## 2024-06-26 DIAGNOSIS — F339 Major depressive disorder, recurrent, unspecified: Secondary | ICD-10-CM | POA: Diagnosis not present

## 2024-06-29 DIAGNOSIS — F339 Major depressive disorder, recurrent, unspecified: Secondary | ICD-10-CM | POA: Diagnosis not present

## 2024-07-01 DIAGNOSIS — F339 Major depressive disorder, recurrent, unspecified: Secondary | ICD-10-CM | POA: Diagnosis not present

## 2024-07-02 DIAGNOSIS — F339 Major depressive disorder, recurrent, unspecified: Secondary | ICD-10-CM | POA: Diagnosis not present

## 2024-07-06 DIAGNOSIS — F339 Major depressive disorder, recurrent, unspecified: Secondary | ICD-10-CM | POA: Diagnosis not present

## 2024-07-07 DIAGNOSIS — F339 Major depressive disorder, recurrent, unspecified: Secondary | ICD-10-CM | POA: Diagnosis not present

## 2024-07-08 ENCOUNTER — Emergency Department (HOSPITAL_COMMUNITY)
Admission: EM | Admit: 2024-07-08 | Discharge: 2024-07-08 | Disposition: A | Discharge status: Home / Self Care | Attending: Emergency Medicine | Admitting: Emergency Medicine

## 2024-07-08 ENCOUNTER — Other Ambulatory Visit: Payer: Self-pay

## 2024-07-08 ENCOUNTER — Other Ambulatory Visit: Payer: Self-pay | Admitting: Emergency Medicine

## 2024-07-08 DIAGNOSIS — R739 Hyperglycemia, unspecified: Secondary | ICD-10-CM | POA: Diagnosis not present

## 2024-07-08 DIAGNOSIS — R5381 Other malaise: Secondary | ICD-10-CM | POA: Diagnosis not present

## 2024-07-08 DIAGNOSIS — E1065 Type 1 diabetes mellitus with hyperglycemia: Secondary | ICD-10-CM | POA: Insufficient documentation

## 2024-07-08 DIAGNOSIS — F339 Major depressive disorder, recurrent, unspecified: Secondary | ICD-10-CM | POA: Diagnosis not present

## 2024-07-08 DIAGNOSIS — G4489 Other headache syndrome: Secondary | ICD-10-CM | POA: Diagnosis not present

## 2024-07-08 DIAGNOSIS — M545 Low back pain, unspecified: Secondary | ICD-10-CM | POA: Diagnosis not present

## 2024-07-08 DIAGNOSIS — E1165 Type 2 diabetes mellitus with hyperglycemia: Secondary | ICD-10-CM | POA: Diagnosis not present

## 2024-07-08 LAB — I-STAT CHEM 8, ED
BUN: 20 mg/dL (ref 6–20)
Calcium, Ion: 1.06 mmol/L — ABNORMAL LOW (ref 1.15–1.40)
Chloride: 102 mmol/L (ref 98–111)
Creatinine, Ser: 1.3 mg/dL — ABNORMAL HIGH (ref 0.61–1.24)
Glucose, Bld: 319 mg/dL — ABNORMAL HIGH (ref 70–99)
HCT: 46 % (ref 39.0–52.0)
Hemoglobin: 15.6 g/dL (ref 13.0–17.0)
Potassium: 4.1 mmol/L (ref 3.5–5.1)
Sodium: 134 mmol/L — ABNORMAL LOW (ref 135–145)
TCO2: 22 mmol/L (ref 22–32)

## 2024-07-08 LAB — BETA-HYDROXYBUTYRIC ACID: Beta-Hydroxybutyric Acid: 0.11 mmol/L (ref 0.05–0.27)

## 2024-07-08 LAB — CBC WITH DIFFERENTIAL/PLATELET
Abs Immature Granulocytes: 0.01 K/uL (ref 0.00–0.07)
Basophils Absolute: 0.1 K/uL (ref 0.0–0.1)
Basophils Relative: 1 %
Eosinophils Absolute: 0.1 K/uL (ref 0.0–0.5)
Eosinophils Relative: 2 %
HCT: 47.2 % (ref 39.0–52.0)
Hemoglobin: 15.7 g/dL (ref 13.0–17.0)
Immature Granulocytes: 0 %
Lymphocytes Relative: 45 %
Lymphs Abs: 2.6 K/uL (ref 0.7–4.0)
MCH: 28.2 pg (ref 26.0–34.0)
MCHC: 33.3 g/dL (ref 30.0–36.0)
MCV: 84.7 fL (ref 80.0–100.0)
Monocytes Absolute: 0.6 K/uL (ref 0.1–1.0)
Monocytes Relative: 10 %
Neutro Abs: 2.4 K/uL (ref 1.7–7.7)
Neutrophils Relative %: 42 %
Platelets: 265 K/uL (ref 150–400)
RBC: 5.57 MIL/uL (ref 4.22–5.81)
RDW: 12.7 % (ref 11.5–15.5)
WBC: 5.7 K/uL (ref 4.0–10.5)
nRBC: 0 % (ref 0.0–0.2)

## 2024-07-08 LAB — COMPREHENSIVE METABOLIC PANEL WITH GFR
ALT: 20 U/L (ref 0–44)
AST: 28 U/L (ref 15–41)
Albumin: 4 g/dL (ref 3.5–5.0)
Alkaline Phosphatase: 90 U/L (ref 38–126)
Anion gap: 12 (ref 5–15)
BUN: 19 mg/dL (ref 6–20)
CO2: 22 mmol/L (ref 22–32)
Calcium: 9 mg/dL (ref 8.9–10.3)
Chloride: 99 mmol/L (ref 98–111)
Creatinine, Ser: 1.35 mg/dL — ABNORMAL HIGH (ref 0.61–1.24)
GFR, Estimated: >60 mL/min (ref >60)
Glucose, Bld: 312 mg/dL — ABNORMAL HIGH (ref 70–99)
Potassium: 4.6 mmol/L (ref 3.5–5.1)
Sodium: 133 mmol/L — ABNORMAL LOW (ref 135–145)
Total Bilirubin: 0.6 mg/dL (ref 0.0–1.2)
Total Protein: 6.7 g/dL (ref 6.5–8.1)

## 2024-07-08 LAB — URINALYSIS, COMPLETE (UACMP) WITH MICROSCOPIC
Bilirubin Urine: NEGATIVE
Glucose, UA: >=500 mg/dL — AB
Hgb urine dipstick: NEGATIVE
Ketones, ur: NEGATIVE mg/dL
Leukocytes,Ua: NEGATIVE
Nitrite: NEGATIVE
Protein, ur: NEGATIVE mg/dL
Specific Gravity, Urine: 1.031 — ABNORMAL HIGH (ref 1.005–1.030)
pH: 5 (ref 5.0–8.0)

## 2024-07-08 LAB — BLOOD GAS, VENOUS
Acid-base deficit: 1.1 mmol/L (ref 0.0–2.0)
Bicarbonate: 24.3 mmol/L (ref 20.0–28.0)
O2 Saturation: 99.7 %
Patient temperature: 37
pCO2, Ven: 42 mmHg — ABNORMAL LOW (ref 44–60)
pH, Ven: 7.37 (ref 7.25–7.43)
pO2, Ven: 81 mmHg — ABNORMAL HIGH (ref 32–45)

## 2024-07-08 LAB — CBG MONITORING, ED
Glucose-Capillary: 310 mg/dL — ABNORMAL HIGH (ref 70–99)
Glucose-Capillary: 357 mg/dL — ABNORMAL HIGH (ref 70–99)
Glucose-Capillary: 166 mg/dL — ABNORMAL HIGH (ref 70–99)
Glucose-Capillary: 113 mg/dL — ABNORMAL HIGH (ref 70–99)
Glucose-Capillary: 191 mg/dL — ABNORMAL HIGH (ref 70–99)

## 2024-07-08 MED ORDER — DEXTROSE IN LACTATED RINGERS 5 % IV SOLN: INTRAVENOUS | Status: DC

## 2024-07-08 MED ORDER — LACTATED RINGERS IV SOLN: INTRAVENOUS | Status: DC

## 2024-07-08 MED ORDER — DEXTROSE 50 % IV SOLN: 0.0000 mL | INTRAVENOUS | Status: DC | PRN

## 2024-07-08 MED ORDER — INSULIN REGULAR(HUMAN) IN NACL 100-0.9 UT/100ML-% IV SOLN
INTRAVENOUS | Status: DC
  Administered 2024-07-08: 13 [IU]/h via INTRAVENOUS
  Filled 2024-07-08: qty 100

## 2024-07-08 NOTE — ED Notes (Signed)
 Pt sleeping no signs of distress

## 2024-07-08 NOTE — ED Notes (Signed)
CBG 357.  

## 2024-07-08 NOTE — ED Triage Notes (Signed)
 Pt BIB EMS coming from [place], c/o high blood sugar. Has not taken his insulin  since yesterday. C/O of HA and lower bilateral back pain. Denies other concerns. NAD  EMS: 148/90, HR 78, Spo2 97% RA, CBG 347

## 2024-07-08 NOTE — ED Provider Notes (Signed)
 [place] EMERGENCY DEPARTMENT AT [facility] Provider Note   CSN: [id number] Arrival date & time: 07/08/24  0118     Patient presents with: Hyperglycemia   [name] [name] is a 38 y.o. male.   The history is provided by the patient.  Hyperglycemia Blood sugar level PTA:  High Severity:  Severe Onset quality:  Gradual Duration: days. Timing:  Constant Progression:  Worsening Chronicity:  Recurrent Diabetes status:  Controlled with insulin  Context: not change in medication, not insulin  pump use, not noncompliance, not recent change in diet and not recent illness   Associated symptoms: no chest pain, no fever and no polyuria        Prior to Admission medications  Medication Sig Start Date End Date Taking? Authorizing Provider  amLODipine  (NORVASC ) 10 MG tablet Take 1 tablet (10 mg total) by mouth daily. 04/06/24 06/09/24  Leotis Bogus, MD  hydrALAZINE  (APRESOLINE ) 25 MG tablet Take 1 tablet (25 mg total) by mouth every 8 (eight) hours. Patient not taking: Reported on 05/24/2024 04/05/24 05/24/24  Leotis Bogus, MD  insulin  glargine (LANTUS  SOLOSTAR) 100 UNIT/ML Solostar Pen Inject 20 Units into the skin daily. 06/17/24   Midge Golas, MD  insulin  lispro (HUMALOG  KWIKPEN) 100 UNIT/ML KwikPen Inject 8 Units into the skin 3 (three) times daily. 06/17/24   Midge Golas, MD  Insulin  Pen Needle (PEN NEEDLES) 31G X 5 MM MISC Use  three times daily 05/05/24   Vernon Ranks, MD  Lancet Device MISC Use 3 (three) times daily. 05/05/24   Vernon Ranks, MD  Lancets MISC Use to check blood sugar three times daily 05/05/24   Vernon Ranks, MD    Allergies: Patient has no known allergies.    Review of Systems  Constitutional:  Negative for fever.  HENT:  Negative for congestion.   Respiratory:  Negative for wheezing and stridor.   Cardiovascular:  Negative for chest pain.  Endocrine: Negative for cold intolerance, heat intolerance, polyphagia and polyuria.  All other  systems reviewed and are negative.   Updated Vital Signs BP (!) 165/116   Pulse 82   Temp 98 F (36.7 C) (Oral)   Resp 20   Ht 5' 11 (1.803 m)   Wt 99.8 kg   SpO2 99%   BMI 30.68 kg/m   Physical Exam Vitals and nursing note reviewed.  Constitutional:      General: He is not in acute distress.    Appearance: He is well-developed. He is not diaphoretic.  HENT:     Head: Normocephalic and atraumatic.     Nose: Nose normal.  Eyes:     Conjunctiva/sclera: Conjunctivae normal.     Pupils: Pupils are equal, round, and reactive to light.  Cardiovascular:     Rate and Rhythm: Normal rate and regular rhythm.     Pulses: Normal pulses.     Heart sounds: Normal heart sounds.  Pulmonary:     Effort: Pulmonary effort is normal.     Breath sounds: Normal breath sounds. No wheezing or rales.  Abdominal:     General: Bowel sounds are normal.     Palpations: Abdomen is soft.     Tenderness: There is no abdominal tenderness. There is no guarding or rebound.  Musculoskeletal:        General: Normal range of motion.     Cervical back: Normal range of motion and neck supple.  Skin:    General: Skin is warm and dry.     Capillary Refill: Capillary  refill takes less than 2 seconds.  Neurological:     General: No focal deficit present.     Mental Status: He is alert and oriented to person, place, and time.     Deep Tendon Reflexes: Reflexes normal.  Psychiatric:        Mood and Affect: Mood normal.        Behavior: Behavior normal.     (all labs ordered are listed, but only abnormal results are displayed) Results for orders placed or performed during the hospital encounter of 07/08/24  Beta-hydroxybutyric acid   Collection Time: 07/08/24  1:56 AM  Result Value Ref Range   Beta-Hydroxybutyric Acid 0.11 0.05 - 0.27 mmol/L  CBC with Differential/Platelet   Collection Time: 07/08/24  1:56 AM  Result Value Ref Range   WBC 5.7 4.0 - 10.5 K/uL   RBC 5.57 4.22 - 5.81 MIL/uL    Hemoglobin 15.7 13.0 - 17.0 g/dL   HCT 52.7 60.9 - 47.9 %   MCV 84.7 80.0 - 100.0 fL   MCH 28.2 26.0 - 34.0 pg   MCHC 33.3 30.0 - 36.0 g/dL   RDW 87.2 88.4 - 84.4 %   Platelets 265 150 - 400 K/uL   nRBC 0.0 0.0 - 0.2 %   Neutrophils Relative % 42 %   Neutro Abs 2.4 1.7 - 7.7 K/uL   Lymphocytes Relative 45 %   Lymphs Abs 2.6 0.7 - 4.0 K/uL   Monocytes Relative 10 %   Monocytes Absolute 0.6 0.1 - 1.0 K/uL   Eosinophils Relative 2 %   Eosinophils Absolute 0.1 0.0 - 0.5 K/uL   Basophils Relative 1 %   Basophils Absolute 0.1 0.0 - 0.1 K/uL   Immature Granulocytes 0 %   Abs Immature Granulocytes 0.01 0.00 - 0.07 K/uL  Comprehensive metabolic panel with GFR   Collection Time: 07/08/24  1:56 AM  Result Value Ref Range   Sodium 133 (L) 135 - 145 mmol/L   Potassium 4.6 3.5 - 5.1 mmol/L   Chloride 99 98 - 111 mmol/L   CO2 22 22 - 32 mmol/L   Glucose, Bld 312 (H) 70 - 99 mg/dL   BUN 19 6 - 20 mg/dL   Creatinine, Ser 8.64 (H) 0.61 - 1.24 mg/dL   Calcium  9.0 8.9 - 10.3 mg/dL   Total Protein 6.7 6.5 - 8.1 g/dL   Albumin 4.0 3.5 - 5.0 g/dL   AST 28 15 - 41 U/L   ALT 20 0 - 44 U/L   Alkaline Phosphatase 90 38 - 126 U/L   Total Bilirubin 0.6 0.0 - 1.2 mg/dL   GFR, Estimated >39 >39 mL/min   Anion gap 12 5 - 15  Urinalysis, Complete w Microscopic -   Collection Time: 07/08/24  1:58 AM  Result Value Ref Range   Color, Urine YELLOW YELLOW   APPearance CLEAR CLEAR   Specific Gravity, Urine 1.031 (H) 1.005 - 1.030   pH 5.0 5.0 - 8.0   Glucose, UA >=500 (A) NEGATIVE mg/dL   Hgb urine dipstick NEGATIVE NEGATIVE   Bilirubin Urine NEGATIVE NEGATIVE   Ketones, ur NEGATIVE NEGATIVE mg/dL   Protein, ur NEGATIVE NEGATIVE mg/dL   Nitrite NEGATIVE NEGATIVE   Leukocytes,Ua NEGATIVE NEGATIVE   RBC / HPF 0-5 0 - 5 RBC/hpf   WBC, UA 0-5 0 - 5 WBC/hpf   Bacteria, UA RARE (A) NONE SEEN   Squamous Epithelial / HPF 0-5 0 - 5 /HPF   Mucus PRESENT   CBG monitoring,  ED   Collection Time: 07/08/24   2:29 AM  Result Value Ref Range   Glucose-Capillary 310 (H) 70 - 99 mg/dL  CBG monitoring, ED   Collection Time: 07/08/24  3:29 AM  Result Value Ref Range   Glucose-Capillary 166 (H) 70 - 99 mg/dL  CBG monitoring, ED   Collection Time: 07/08/24  4:33 AM  Result Value Ref Range   Glucose-Capillary 113 (H) 70 - 99 mg/dL   DG Chest Portable 1 View Result Date: 06/09/2024 EXAM: 1 VIEW(S) XRAY OF THE CHEST 06/09/2024 02:09:00 AM COMPARISON: 06/04/2024 CLINICAL HISTORY: Chest pain FINDINGS: LUNGS AND PLEURA: No focal pulmonary opacity. No pulmonary edema. No pleural effusion. No pneumothorax. HEART AND MEDIASTINUM: No acute abnormality of the cardiac and mediastinal silhouettes. BONES AND SOFT TISSUES: No acute osseous abnormality. IMPRESSION: 1. No acute cardiopulmonary process. Electronically signed by: Pinkie Pebbles MD 06/09/2024 02:15 AM EST RP Workstation: [id number]     Radiology: No results found.   .Critical Care  Performed by: Nettie Earing, MD Authorized by: Nettie Earing, MD   Critical care provider statement:    Critical care time (minutes):  30   Critical care end time:  07/08/2024 5:18 AM   Critical care was necessary to treat or prevent imminent or life-threatening deterioration of the following conditions:  Endocrine crisis   Critical care was time spent personally by me on the following activities:  Development of treatment plan with patient or surrogate, discussions with consultants, evaluation of patient's response to treatment, examination of patient, ordering and review of laboratory studies, ordering and review of radiographic studies, ordering and performing treatments and interventions, pulse oximetry, re-evaluation of patient's condition and review of old charts   I assumed direction of critical care for this patient from another provider in my specialty: no      Medications Ordered in the ED  insulin  regular, human (MYXREDLIN ) 100 units/ 100 mL infusion (0  Units/hr Intravenous Stopped 07/08/24 0340)  lactated ringers  infusion (0 mLs Intravenous Stopped 07/08/24 0447)  dextrose  5 % in lactated ringers  infusion (0 mLs Intravenous Stopped 07/08/24 0440)  dextrose  50 % solution 0-50 mL (has no administration in time range)                                    Medical Decision Making Patient with type 1 DM presents with elevated glucose   Amount and/or Complexity of Data Reviewed Independent Historian: EMS    Details: See above  External Data Reviewed: notes.    Details: Previous notes reviewed  Labs: ordered.    Details: Urine is negative for UTI.  No ketones.  Anion gap is normal. Sodium 133, corrects to normal for glucose normal potassium 3.5, slight elevation of creatinine 1.35 (hydrated in ED) elevated glucose 312 normal LFTs,  negative beta hydroxybutyrate 0.11 glucose normal now 113   Risk Prescription drug management. Risk Details: Sleeping comfortably in bed.  No DKA transitioned off drip.  PO challenged successfully in the ED.  Stable for discharge.  Strict returns.       Final diagnoses:  Hyperglycemia    No signs of systemic illness or infection. The patient is nontoxic-appearing on exam and vital signs are within normal limits.  I have reviewed the triage vital signs and the nursing notes. Pertinent labs & imaging results that were available during my care of the patient were reviewed by me and considered in my medical  decision making (see chart for details). After history, exam, and medical workup I feel the patient has been appropriately medically screened and is safe for discharge home. Pertinent diagnoses were discussed with the patient. Patient was given return precautions.  ED Discharge Orders     None          Edgard Debord, MD 07/08/24 [phone]

## 2024-07-09 LAB — URINE CULTURE: Culture: NO GROWTH

## 2024-07-10 DIAGNOSIS — F339 Major depressive disorder, recurrent, unspecified: Secondary | ICD-10-CM | POA: Diagnosis not present

## 2024-07-13 DIAGNOSIS — F339 Major depressive disorder, recurrent, unspecified: Secondary | ICD-10-CM | POA: Diagnosis not present

## 2024-07-15 DIAGNOSIS — F339 Major depressive disorder, recurrent, unspecified: Secondary | ICD-10-CM | POA: Diagnosis not present

## 2024-07-16 ENCOUNTER — Encounter (HOSPITAL_COMMUNITY): Payer: Self-pay | Admitting: Emergency Medicine

## 2024-07-16 ENCOUNTER — Other Ambulatory Visit: Payer: Self-pay

## 2024-07-16 ENCOUNTER — Emergency Department (HOSPITAL_COMMUNITY)

## 2024-07-16 ENCOUNTER — Emergency Department (HOSPITAL_COMMUNITY)
Admission: EM | Admit: 2024-07-16 | Discharge: 2024-07-16 | Disposition: A | Discharge status: Home / Self Care | Attending: Emergency Medicine | Admitting: Emergency Medicine

## 2024-07-16 DIAGNOSIS — R739 Hyperglycemia, unspecified: Secondary | ICD-10-CM

## 2024-07-16 DIAGNOSIS — Z79899 Other long term (current) drug therapy: Secondary | ICD-10-CM | POA: Insufficient documentation

## 2024-07-16 DIAGNOSIS — I10 Essential (primary) hypertension: Secondary | ICD-10-CM | POA: Insufficient documentation

## 2024-07-16 DIAGNOSIS — R059 Cough, unspecified: Secondary | ICD-10-CM | POA: Diagnosis not present

## 2024-07-16 DIAGNOSIS — E1110 Type 2 diabetes mellitus with ketoacidosis without coma: Secondary | ICD-10-CM | POA: Diagnosis not present

## 2024-07-16 DIAGNOSIS — R112 Nausea with vomiting, unspecified: Secondary | ICD-10-CM | POA: Diagnosis not present

## 2024-07-16 DIAGNOSIS — R197 Diarrhea, unspecified: Secondary | ICD-10-CM | POA: Insufficient documentation

## 2024-07-16 DIAGNOSIS — M791 Myalgia, unspecified site: Secondary | ICD-10-CM | POA: Diagnosis not present

## 2024-07-16 DIAGNOSIS — R0789 Other chest pain: Secondary | ICD-10-CM | POA: Diagnosis not present

## 2024-07-16 DIAGNOSIS — Z794 Long term (current) use of insulin: Secondary | ICD-10-CM | POA: Diagnosis not present

## 2024-07-16 DIAGNOSIS — E1165 Type 2 diabetes mellitus with hyperglycemia: Secondary | ICD-10-CM | POA: Insufficient documentation

## 2024-07-16 DIAGNOSIS — E871 Hypo-osmolality and hyponatremia: Secondary | ICD-10-CM | POA: Insufficient documentation

## 2024-07-16 DIAGNOSIS — R079 Chest pain, unspecified: Secondary | ICD-10-CM | POA: Diagnosis not present

## 2024-07-16 LAB — CBC WITH DIFFERENTIAL/PLATELET
Abs Immature Granulocytes: 0.01 K/uL (ref 0.00–0.07)
Basophils Absolute: 0.1 K/uL (ref 0.0–0.1)
Basophils Relative: 1 %
Eosinophils Absolute: 0.1 K/uL (ref 0.0–0.5)
Eosinophils Relative: 3 %
HCT: 46.5 % (ref 39.0–52.0)
Hemoglobin: 15.6 g/dL (ref 13.0–17.0)
Immature Granulocytes: 0 %
Lymphocytes Relative: 40 %
Lymphs Abs: 2.1 K/uL (ref 0.7–4.0)
MCH: 28.6 pg (ref 26.0–34.0)
MCHC: 33.5 g/dL (ref 30.0–36.0)
MCV: 85.2 fL (ref 80.0–100.0)
Monocytes Absolute: 0.7 K/uL (ref 0.1–1.0)
Monocytes Relative: 13 %
Neutro Abs: 2.3 K/uL (ref 1.7–7.7)
Neutrophils Relative %: 43 %
Platelets: 247 K/uL (ref 150–400)
RBC: 5.46 MIL/uL (ref 4.22–5.81)
RDW: 12.8 % (ref 11.5–15.5)
WBC: 5.4 K/uL (ref 4.0–10.5)
nRBC: 0 % (ref 0.0–0.2)

## 2024-07-16 LAB — COMPREHENSIVE METABOLIC PANEL WITH GFR
ALT: 24 U/L (ref 0–44)
AST: 46 U/L — ABNORMAL HIGH (ref 15–41)
Albumin: 4.1 g/dL (ref 3.5–5.0)
Alkaline Phosphatase: 82 U/L (ref 38–126)
Anion gap: 12 (ref 5–15)
BUN: 15 mg/dL (ref 6–20)
CO2: 22 mmol/L (ref 22–32)
Calcium: 9.5 mg/dL (ref 8.9–10.3)
Chloride: 97 mmol/L — ABNORMAL LOW (ref 98–111)
Creatinine, Ser: 1.3 mg/dL — ABNORMAL HIGH (ref 0.61–1.24)
GFR, Estimated: >60 mL/min
Glucose, Bld: 443 mg/dL — ABNORMAL HIGH (ref 70–99)
Potassium: 4.1 mmol/L (ref 3.5–5.1)
Sodium: 132 mmol/L — ABNORMAL LOW (ref 135–145)
Total Bilirubin: 1 mg/dL (ref 0.0–1.2)
Total Protein: 7.1 g/dL (ref 6.5–8.1)

## 2024-07-16 LAB — I-STAT VENOUS BLOOD GAS, ED
Acid-base deficit: 1 mmol/L (ref 0.0–2.0)
Bicarbonate: 24.3 mmol/L (ref 20.0–28.0)
Calcium, Ion: 1.12 mmol/L — ABNORMAL LOW (ref 1.15–1.40)
HCT: 47 % (ref 39.0–52.0)
Hemoglobin: 16 g/dL (ref 13.0–17.0)
O2 Saturation: 83 %
Potassium: 4 mmol/L (ref 3.5–5.1)
Sodium: 133 mmol/L — ABNORMAL LOW (ref 135–145)
TCO2: 26 mmol/L (ref 22–32)
pCO2, Ven: 42.9 mmHg — ABNORMAL LOW (ref 44–60)
pH, Ven: 7.361 (ref 7.25–7.43)
pO2, Ven: 49 mmHg — ABNORMAL HIGH (ref 32–45)

## 2024-07-16 LAB — CBG MONITORING, ED
Glucose-Capillary: 326 mg/dL — ABNORMAL HIGH (ref 70–99)
Glucose-Capillary: 330 mg/dL — ABNORMAL HIGH (ref 70–99)
Glucose-Capillary: 423 mg/dL — ABNORMAL HIGH (ref 70–99)

## 2024-07-16 LAB — LIPASE, BLOOD: Lipase: 84 U/L — ABNORMAL HIGH (ref 11–51)

## 2024-07-16 LAB — URINALYSIS, ROUTINE W REFLEX MICROSCOPIC
Bilirubin Urine: NEGATIVE
Glucose, UA: >=500 mg/dL — AB
Hgb urine dipstick: NEGATIVE
Ketones, ur: NEGATIVE mg/dL
Leukocytes,Ua: NEGATIVE
Nitrite: NEGATIVE
Protein, ur: NEGATIVE mg/dL
Specific Gravity, Urine: 1.02 (ref 1.005–1.030)
pH: 5.5 (ref 5.0–8.0)

## 2024-07-16 LAB — URINALYSIS, MICROSCOPIC (REFLEX): Bacteria, UA: NONE SEEN

## 2024-07-16 LAB — RESP PANEL BY RT-PCR (RSV, FLU A&B, COVID)  RVPGX2
Influenza A by PCR: NEGATIVE
Influenza B by PCR: NEGATIVE
Resp Syncytial Virus by PCR: NEGATIVE
SARS Coronavirus 2 by RT PCR: NEGATIVE

## 2024-07-16 LAB — BETA-HYDROXYBUTYRIC ACID: Beta-Hydroxybutyric Acid: 0.35 mmol/L — ABNORMAL HIGH (ref 0.05–0.27)

## 2024-07-16 MED ORDER — SODIUM CHLORIDE 0.9 % IV BOLUS: 1000.0000 mL | Freq: Once | INTRAVENOUS | Status: DC

## 2024-07-16 MED ORDER — SODIUM CHLORIDE 0.9 % IV BOLUS
2000.0000 mL | Freq: Once | INTRAVENOUS | Status: AC
  Administered 2024-07-16: 2000 mL via INTRAVENOUS

## 2024-07-16 MED ORDER — INSULIN ASPART 100 UNIT/ML IJ SOLN
8.0000 [IU] | Freq: Once | INTRAMUSCULAR | Status: AC
  Administered 2024-07-16: 8 [IU] via SUBCUTANEOUS
  Filled 2024-07-16: qty 8

## 2024-07-16 MED ORDER — INSULIN ASPART 100 UNIT/ML IJ SOLN: 10.0000 [IU] | Freq: Once | INTRAMUSCULAR | Status: DC

## 2024-07-16 NOTE — Discharge Instructions (Signed)
 Your EKG, x-ray and lab work are reassuring.  You do have evidence of hyperglycemia but no need for admission.  Continue to monitor blood sugar.  Return to ER with new or worsening symptoms.

## 2024-07-16 NOTE — ED Provider Notes (Signed)
" °  Physical Exam  BP 136/89 (BP Location: Right Arm)   Pulse 74   Temp 98.1 F (36.7 C)   Resp 18   Ht 5' 11 (1.803 m)   Wt 99.8 kg   SpO2 100%   BMI 30.68 kg/m   Physical Exam Constitutional:      Appearance: Normal appearance.  Cardiovascular:     Rate and Rhythm: Normal rate and regular rhythm.  Pulmonary:     Effort: Pulmonary effort is normal.     Breath sounds: Normal breath sounds.  Abdominal:     General: Abdomen is flat. Bowel sounds are normal.     Palpations: Abdomen is soft.  Neurological:     Mental Status: He is alert.     Procedures  Procedures  ED Course / MDM   Clinical Course as of 07/16/24 0801  Fri Jul 16, 2024  9354 Has had flu like symptoms for a few days, SOB and hyperglycemia. Getting fluids here, subQ insulin .  Awaiting chest x-ray. [JB]  0703 EKG normal sinus, Chest x-ray no acute findings. Resp panel negative. Has hyperglycemia without evidence of DKA.  [JB]  [id number] Patient reassessed and is feeling better. Hyperglycemia has improved after treatment here. Reports needing no refills on medication. Stable for discharge.  [JB]    Clinical Course User Index [JB] Patrik Turnbaugh, Warren SAILOR, PA-C   Medical Decision Making Amount and/or Complexity of Data Reviewed Labs: ordered. Radiology: ordered.  Risk Prescription drug management.          Shermon Warren SAILOR, PA-C 07/16/24 9198  "

## 2024-07-16 NOTE — ED Provider Triage Note (Signed)
 Emergency Medicine Provider Triage Evaluation Note  [name] [name] , a 38 y.o. male  was evaluated in triage.  Pt with history of DM T1, currently unhoused who presents with 4 days of nausea vomiting and fatigue.  He states his insulin  is stored in the refrigerator at a local shelter, however his sugars have been persistently high the last 2 days despite using his insulin  as prescribed..  Review of Systems  Positive: As above Negative: Urinary frequency, fevers  Physical Exam  BP (!) 149/107 (BP Location: Right Arm)   Pulse 90   Temp 98.4 F (36.9 C)   Resp 18   Ht 5' 11 (1.803 m)   Wt 99.8 kg   SpO2 100%   BMI 30.68 kg/m  Gen:   Awake, no distress   Resp:  Normal effort  MSK:   Moves extremities without difficulty  Other:  RRR no M/R/G.  Lungs CTAB.  Medical Decision Making  Medically screening exam initiated at 1:48 AM.  Appropriate orders placed.  LARY ECKARDT was informed that the remainder of the evaluation will be completed by another provider, this initial triage assessment does not replace that evaluation, and the importance of remaining in the ED until their evaluation is complete. This chart was dictated using voice recognition software, Dragon. Despite the best efforts of this provider to proofread and correct errors, errors may still occur which can change documentation meaning.     Bobette Pleasant SAUNDERS, PA-C 07/16/24 0157

## 2024-07-16 NOTE — ED Notes (Signed)
 Called PT three times for role call and no answer.[email or web address]

## 2024-07-16 NOTE — ED Triage Notes (Signed)
 Pt bib ems from home with c/o flu like symptoms x a few days and n/v/d started today. T1D, cbg high

## 2024-07-16 NOTE — ED Provider Notes (Signed)
 "  EMERGENCY DEPARTMENT AT [facility] Provider Note   CSN: [id number] Arrival date & time: 07/16/24  9963     Patient presents with: No chief complaint on file.   [name] [name] is a 38 y.o. male with history of insulin -dependent diabetes, recurrent history of HHS and DKA, who is currently unhoused.  His insulin  is stored reportedly at a shelter in the refrigerator.  He states he is compliant with his insulin  as prescribed, however he endorses persistent hyperglycemia.  Has been having nausea, vomiting, loose stools that started today, body aches.  Was seen On 12/11 with hyperglycemia was not found to be in DKA at that time. Patient with history of hypertension, MSSA bacteremia, polysubstance use.  HPI     Prior to Admission medications  Medication Sig Start Date End Date Taking? Authorizing Provider  amLODipine  (NORVASC ) 10 MG tablet Take 1 tablet (10 mg total) by mouth daily. 04/06/24 06/09/24  Leotis Bogus, MD  hydrALAZINE  (APRESOLINE ) 25 MG tablet Take 1 tablet (25 mg total) by mouth every 8 (eight) hours. Patient not taking: Reported on 05/24/2024 04/05/24 05/24/24  Leotis Bogus, MD  insulin  glargine (LANTUS  SOLOSTAR) 100 UNIT/ML Solostar Pen Inject 20 Units into the skin daily. 06/17/24   Midge Golas, MD  insulin  lispro (HUMALOG  KWIKPEN) 100 UNIT/ML KwikPen Inject 8 Units into the skin 3 (three) times daily. 06/17/24   Midge Golas, MD  Insulin  Pen Needle (PEN NEEDLES) 31G X 5 MM MISC Use  three times daily 05/05/24   Vernon Ranks, MD  Lancet Device MISC Use 3 (three) times daily. 05/05/24   Vernon Ranks, MD  Lancets MISC Use to check blood sugar three times daily 05/05/24   Vernon Ranks, MD    Allergies: Patient has no known allergies.    Review of Systems  Constitutional:  Positive for appetite change, chills, fatigue and fever.  HENT: Negative.    Respiratory:  Positive for chest tightness. Negative for cough and shortness of breath.    Cardiovascular: Negative.   Gastrointestinal:  Positive for diarrhea, nausea and vomiting. Negative for abdominal pain.  Musculoskeletal: Negative.  Negative for arthralgias.  Skin: Negative.     Updated Vital Signs BP 136/89 (BP Location: Right Arm)   Pulse 74   Temp 98.1 F (36.7 C)   Resp 18   Ht 5' 11 (1.803 m)   Wt 99.8 kg   SpO2 100%   BMI 30.68 kg/m   Physical Exam Vitals and nursing note reviewed.  Constitutional:      Appearance: He is not ill-appearing or toxic-appearing.  HENT:     Head: Normocephalic and atraumatic.     Mouth/Throat:     Mouth: Mucous membranes are moist.     Pharynx: No oropharyngeal exudate or posterior oropharyngeal erythema.  Eyes:     General:        Right eye: No discharge.        Left eye: No discharge.     Extraocular Movements: Extraocular movements intact.     Conjunctiva/sclera: Conjunctivae normal.     Pupils: Pupils are equal, round, and reactive to light.  Cardiovascular:     Rate and Rhythm: Normal rate and regular rhythm.     Pulses: Normal pulses.     Heart sounds: Normal heart sounds. No murmur heard. Pulmonary:     Effort: Pulmonary effort is normal. No respiratory distress.     Breath sounds: Normal breath sounds. No wheezing or rales.  Abdominal:  General: Bowel sounds are normal. There is no distension.     Palpations: Abdomen is soft.     Tenderness: There is no abdominal tenderness. There is no guarding or rebound.  Musculoskeletal:        General: No deformity.     Cervical back: Neck supple.     Right lower leg: No edema.     Left lower leg: No edema.  Skin:    General: Skin is warm and dry.     Capillary Refill: Capillary refill takes less than 2 seconds.  Neurological:     General: No focal deficit present.     Mental Status: He is alert and oriented to person, place, and time. Mental status is at baseline.  Psychiatric:        Mood and Affect: Mood normal.     (all labs ordered are listed, but  only abnormal results are displayed) Labs Reviewed  COMPREHENSIVE METABOLIC PANEL WITH GFR - Abnormal; Notable for the following components:      Result Value   Sodium 132 (*)    Chloride 97 (*)    Glucose, Bld 443 (*)    Creatinine, Ser 1.30 (*)    AST 46 (*)    All other components within normal limits  LIPASE, BLOOD - Abnormal; Notable for the following components:   Lipase 84 (*)    All other components within normal limits  BETA-HYDROXYBUTYRIC ACID - Abnormal; Notable for the following components:   Beta-Hydroxybutyric Acid 0.35 (*)    All other components within normal limits  CBG MONITORING, ED - Abnormal; Notable for the following components:   Glucose-Capillary 423 (*)    All other components within normal limits  I-STAT VENOUS BLOOD GAS, ED - Abnormal; Notable for the following components:   pCO2, Ven 42.9 (*)    pO2, Ven 49 (*)    Sodium 133 (*)    Calcium , Ion 1.12 (*)    All other components within normal limits  RESP PANEL BY RT-PCR (RSV, FLU A&B, COVID)  RVPGX2  CBC WITH DIFFERENTIAL/PLATELET  URINALYSIS, ROUTINE W REFLEX MICROSCOPIC    EKG: EKG Interpretation Date/Time:  Friday July 16 2024 00:50:08 EST Ventricular Rate:  81 PR Interval:  144 QRS Duration:  108 QT Interval:  400 QTC Calculation: 464 R Axis:   -17  Text Interpretation: Normal sinus rhythm Cannot rule out Anterior infarct , age undetermined Abnormal ECG When compared with ECG of 17-Jun-2024 00:54, No significant change was found Confirmed by Raford Lenis ([id number]) on 07/16/2024 3:18:00 AM  Radiology: No results found.   Procedures   Medications Ordered in the ED - No data to display                                  Medical Decision Making 38 year old male with IDDM who presents with concern for nausea, vomiting, myalgias.  Hypertensive noncompliance otherwise normal.  Cardiopulmonary Sam unremarkable, abdominal exam is benign.  Poor personal hygiene in context of homelessness.   GCS 15.  DDx includes but is not limited to hyperglycemia, HHS, DKA, acute viral illness such as influenza.  Amount and/or Complexity of Data Reviewed Labs: ordered.    Details: CBC unremarkable, CMP with pseudohyponatremia, creatinine of 1.3 near patient's baseline.  Mildly elevated AST to 46.  Hyperglycemia with CBG of 423, mild elevated lipase to 84.  RVP negative.  Beta hydroxy mildly elevated to 0.35.  VBG  without acidosis but with mildly decreased pCO2.  EKG  Risk Prescription drug management.   Care of this patient signed out to oncoming ED provider Barrett, PA-C at time of shift change. All pertinent HPI, physical exam, and laboratory findings were discussed with them prior to my departure. Disposition of patient pending completion of workup, reevaluation, and clinical judgement of oncoming ED provider. Patient pending repeat CBG, fluids, and insulin  at time of shift change.   This chart was dictated using voice recognition software, Dragon. Despite the best efforts of this provider to proofread and correct errors, errors may still occur which can change documentation meaning.      Final diagnoses:  None    ED Discharge Orders     None          [name] [name] 07/16/24 9373    [name] Ozell HERO, MD 07/16/24 [phone]  "

## 2024-07-17 DIAGNOSIS — F339 Major depressive disorder, recurrent, unspecified: Secondary | ICD-10-CM | POA: Diagnosis not present

## 2024-07-21 DIAGNOSIS — F339 Major depressive disorder, recurrent, unspecified: Secondary | ICD-10-CM | POA: Diagnosis not present

## 2024-07-23 DIAGNOSIS — F339 Major depressive disorder, recurrent, unspecified: Secondary | ICD-10-CM | POA: Diagnosis not present

## 2024-07-24 DIAGNOSIS — F339 Major depressive disorder, recurrent, unspecified: Secondary | ICD-10-CM | POA: Diagnosis not present

## 2024-07-29 ENCOUNTER — Encounter (HOSPITAL_COMMUNITY): Payer: Self-pay

## 2024-07-29 ENCOUNTER — Other Ambulatory Visit: Payer: Self-pay

## 2024-07-29 ENCOUNTER — Emergency Department (HOSPITAL_COMMUNITY)
Admission: EM | Admit: 2024-07-29 | Discharge: 2024-07-29 | Disposition: A | Discharge status: Home / Self Care | Attending: Emergency Medicine | Admitting: Emergency Medicine

## 2024-07-29 DIAGNOSIS — Z794 Long term (current) use of insulin: Secondary | ICD-10-CM | POA: Insufficient documentation

## 2024-07-29 DIAGNOSIS — Z5900 Homelessness unspecified: Secondary | ICD-10-CM | POA: Diagnosis not present

## 2024-07-29 DIAGNOSIS — E1065 Type 1 diabetes mellitus with hyperglycemia: Secondary | ICD-10-CM | POA: Diagnosis present

## 2024-07-29 DIAGNOSIS — E109 Type 1 diabetes mellitus without complications: Secondary | ICD-10-CM | POA: Insufficient documentation

## 2024-07-29 DIAGNOSIS — Z5941 Food insecurity: Secondary | ICD-10-CM | POA: Diagnosis not present

## 2024-07-29 LAB — CBG MONITORING, ED
Glucose-Capillary: 201 mg/dL — ABNORMAL HIGH (ref 70–99)
Glucose-Capillary: 84 mg/dL (ref 70–99)
Glucose-Capillary: 145 mg/dL — ABNORMAL HIGH (ref 70–99)

## 2024-07-29 NOTE — ED Provider Notes (Signed)
 " [place] EMERGENCY DEPARTMENT AT [facility] Provider Note   CSN: [id number] Arrival date & time: 07/29/24  0400     Patient presents with: Hyperglycemia   [name] [name] is a 39 y.o. male.   39 yo M with a cc of feeling like his blood sugar might go low.  The patient tells me that he has not really been able to eat today.  Says that he is homeless and so does not have access to much food.  He does feel like he could eat if some food was provided for him.  He denies cough congestion fever.  Denies abdominal pain.   Hyperglycemia      Prior to Admission medications  Medication Sig Start Date End Date Taking? Authorizing Provider  amLODipine  (NORVASC ) 10 MG tablet Take 1 tablet (10 mg total) by mouth daily. 04/06/24 06/09/24  Leotis Bogus, MD  hydrALAZINE  (APRESOLINE ) 25 MG tablet Take 1 tablet (25 mg total) by mouth every 8 (eight) hours. Patient not taking: Reported on 05/24/2024 04/05/24 05/24/24  Leotis Bogus, MD  insulin  glargine (LANTUS  SOLOSTAR) 100 UNIT/ML Solostar Pen Inject 20 Units into the skin daily. 06/17/24   Midge Golas, MD  insulin  lispro (HUMALOG  KWIKPEN) 100 UNIT/ML KwikPen Inject 8 Units into the skin 3 (three) times daily. 06/17/24   Midge Golas, MD  Insulin  Pen Needle (PEN NEEDLES) 31G X 5 MM MISC Use  three times daily 05/05/24   Vernon Ranks, MD  Lancet Device MISC Use 3 (three) times daily. 05/05/24   Vernon Ranks, MD  Lancets MISC Use to check blood sugar three times daily 05/05/24   Vernon Ranks, MD    Allergies: Patient has no known allergies.    Review of Systems  Updated Vital Signs BP (!) 150/99   Pulse (!) 113   Temp 98.2 F (36.8 C) (Oral)   Resp 16   SpO2 100%   Physical Exam Vitals and nursing note reviewed.  Constitutional:      Appearance: He is well-developed.  HENT:     Head: Normocephalic and atraumatic.  Eyes:     Pupils: Pupils are equal, round, and reactive to light.  Neck:     Vascular: No JVD.   Cardiovascular:     Rate and Rhythm: Normal rate and regular rhythm.     Heart sounds: No murmur heard.    No friction rub. No gallop.  Pulmonary:     Effort: No respiratory distress.     Breath sounds: No wheezing.  Abdominal:     General: There is no distension.     Tenderness: There is no abdominal tenderness. There is no guarding or rebound.  Musculoskeletal:        General: Normal range of motion.     Cervical back: Normal range of motion and neck supple.  Skin:    Coloration: Skin is not pale.     Findings: No rash.  Neurological:     Mental Status: He is alert and oriented to person, place, and time.  Psychiatric:        Behavior: Behavior normal.     (all labs ordered are listed, but only abnormal results are displayed) Labs Reviewed  CBG MONITORING, ED - Abnormal; Notable for the following components:      Result Value   Glucose-Capillary 201 (*)    All other components within normal limits  CBG MONITORING, ED - Abnormal; Notable for the following components:   Glucose-Capillary 145 (*)  All other components within normal limits  CBG MONITORING, ED    EKG: None  Radiology: No results found.   Procedures   Medications Ordered in the ED - No data to display                                  Medical Decision Making  39 yo M with concerned that his blood sugar might be dropping.  Patient has a history of type 1 diabetes.  He unfortunately is suffering from food insecurity.  He has not been able to find anything to eat today.  Was worried his blood sugar may go low.  No hypoglycemia here.  Will give the patient something to eat.  Recheck his blood sugar.  Reassess.  Cbg 145. Will d/c home.  Social work consult for food insecurity.   6:56 AM:  I have discussed the diagnosis/risks/treatment options with the patient.  Evaluation and diagnostic testing in the emergency department does not suggest an emergent condition requiring admission or immediate  intervention beyond what has been performed at this time.  They will follow up with PCP. We also discussed returning to the ED immediately if new or worsening sx occur. We discussed the sx which are most concerning (e.g., sudden worsening pain, fever, inability to tolerate by mouth) that necessitate immediate return. Medications administered to the patient during their visit and any new prescriptions provided to the patient are listed below.  Medications given during this visit Medications - No data to display   The patient appears reasonably screen and/or stabilized for discharge and I doubt any other medical condition or other [facility] requiring further screening, evaluation, or treatment in the ED at this time prior to discharge.       Final diagnoses:  Food insecurity    ED Discharge Orders     None          Emil Share, [place] 07/29/24 9343  "

## 2024-07-29 NOTE — ED Triage Notes (Signed)
 Arrives [facility] from outside at a business with concern of increasing blood sugar.   Initial cbg 214 with ems and increased to 290.   Diaphoretic and says this usually happens when its dropping.   T1DM. Only takes insulin  in the morning.

## 2024-07-29 NOTE — ED Notes (Signed)
 Pt educated on DC instructions and verbalizes understanding, RR even and unlabored, pt appears to be in NAD at this time.

## 2024-07-29 NOTE — Discharge Instructions (Addendum)
 Please follow-up with your family doctor.  If you do not have 1 I have given you information for a provider that specializes in folks that do not have housing.  Sometimes they can help you with resources.

## 2024-08-05 ENCOUNTER — Emergency Department (HOSPITAL_COMMUNITY)
Admission: EM | Admit: 2024-08-05 | Discharge: 2024-08-05 | Disposition: A | Discharge status: Home / Self Care | Attending: Emergency Medicine | Admitting: Emergency Medicine

## 2024-08-05 ENCOUNTER — Other Ambulatory Visit: Payer: Self-pay

## 2024-08-05 ENCOUNTER — Encounter (HOSPITAL_COMMUNITY): Payer: Self-pay | Admitting: Emergency Medicine

## 2024-08-05 DIAGNOSIS — R1110 Vomiting, unspecified: Secondary | ICD-10-CM | POA: Diagnosis present

## 2024-08-05 DIAGNOSIS — Z794 Long term (current) use of insulin: Secondary | ICD-10-CM | POA: Diagnosis not present

## 2024-08-05 DIAGNOSIS — E871 Hypo-osmolality and hyponatremia: Secondary | ICD-10-CM | POA: Insufficient documentation

## 2024-08-05 DIAGNOSIS — E1165 Type 2 diabetes mellitus with hyperglycemia: Secondary | ICD-10-CM | POA: Insufficient documentation

## 2024-08-05 DIAGNOSIS — I10 Essential (primary) hypertension: Secondary | ICD-10-CM | POA: Diagnosis not present

## 2024-08-05 DIAGNOSIS — N289 Disorder of kidney and ureter, unspecified: Secondary | ICD-10-CM | POA: Insufficient documentation

## 2024-08-05 DIAGNOSIS — R739 Hyperglycemia, unspecified: Secondary | ICD-10-CM

## 2024-08-05 DIAGNOSIS — Z79899 Other long term (current) drug therapy: Secondary | ICD-10-CM | POA: Insufficient documentation

## 2024-08-05 LAB — COMPREHENSIVE METABOLIC PANEL WITH GFR
ALT: 19 U/L (ref 0–44)
AST: 28 U/L (ref 15–41)
Albumin: 3.9 g/dL (ref 3.5–5.0)
Alkaline Phosphatase: 99 U/L (ref 38–126)
Anion gap: 12 (ref 5–15)
BUN: 19 mg/dL (ref 6–20)
CO2: 24 mmol/L (ref 22–32)
Calcium: 9 mg/dL (ref 8.9–10.3)
Chloride: 84 mmol/L — ABNORMAL LOW (ref 98–111)
Creatinine, Ser: 1.81 mg/dL — ABNORMAL HIGH (ref 0.61–1.24)
GFR, Estimated: 48 mL/min — ABNORMAL LOW
Glucose, Bld: 983 mg/dL (ref 70–99)
Potassium: 5.2 mmol/L — ABNORMAL HIGH (ref 3.5–5.1)
Sodium: 120 mmol/L — ABNORMAL LOW (ref 135–145)
Total Bilirubin: 0.7 mg/dL (ref 0.0–1.2)
Total Protein: 6.9 g/dL (ref 6.5–8.1)

## 2024-08-05 LAB — URINALYSIS, ROUTINE W REFLEX MICROSCOPIC
Bacteria, UA: NONE SEEN
Bilirubin Urine: NEGATIVE
Glucose, UA: >=500 mg/dL — AB
Hgb urine dipstick: NEGATIVE
Ketones, ur: NEGATIVE mg/dL
Leukocytes,Ua: NEGATIVE
Nitrite: NEGATIVE
Protein, ur: NEGATIVE mg/dL
Specific Gravity, Urine: 1.024 (ref 1.005–1.030)
pH: 6 (ref 5.0–8.0)

## 2024-08-05 LAB — I-STAT CHEM 8, ED
BUN: 21 mg/dL — ABNORMAL HIGH (ref 6–20)
Calcium, Ion: 1.08 mmol/L — ABNORMAL LOW (ref 1.15–1.40)
Chloride: 87 mmol/L — ABNORMAL LOW (ref 98–111)
Creatinine, Ser: 1.7 mg/dL — ABNORMAL HIGH (ref 0.61–1.24)
Glucose, Bld: >700 mg/dL (ref 70–99)
HCT: 46 % (ref 39.0–52.0)
Hemoglobin: 15.6 g/dL (ref 13.0–17.0)
Potassium: 5.1 mmol/L (ref 3.5–5.1)
Sodium: 122 mmol/L — ABNORMAL LOW (ref 135–145)
TCO2: 24 mmol/L (ref 22–32)

## 2024-08-05 LAB — CBG MONITORING, ED
Glucose-Capillary: 261 mg/dL — ABNORMAL HIGH (ref 70–99)
Glucose-Capillary: 412 mg/dL — ABNORMAL HIGH (ref 70–99)
Glucose-Capillary: >600 mg/dL (ref 70–99)

## 2024-08-05 LAB — CBC
HCT: 44.2 % (ref 39.0–52.0)
Hemoglobin: 14.8 g/dL (ref 13.0–17.0)
MCH: 28.1 pg (ref 26.0–34.0)
MCHC: 33.5 g/dL (ref 30.0–36.0)
MCV: 84 fL (ref 80.0–100.0)
Platelets: 202 K/uL (ref 150–400)
RBC: 5.26 MIL/uL (ref 4.22–5.81)
RDW: 12.5 % (ref 11.5–15.5)
WBC: 5.1 K/uL (ref 4.0–10.5)
nRBC: 0 % (ref 0.0–0.2)

## 2024-08-05 LAB — BLOOD GAS, VENOUS
Acid-Base Excess: 2.2 mmol/L — ABNORMAL HIGH (ref 0.0–2.0)
Bicarbonate: 27.3 mmol/L (ref 20.0–28.0)
O2 Saturation: 93.5 %
Patient temperature: 37
pCO2, Ven: 43 mmHg — ABNORMAL LOW (ref 44–60)
pH, Ven: 7.41 (ref 7.25–7.43)
pO2, Ven: 62 mmHg — ABNORMAL HIGH (ref 32–45)

## 2024-08-05 MED ORDER — INSULIN GLARGINE-YFGN 100 UNIT/ML ~~LOC~~ SOLN
20.0000 [IU] | Freq: Once | SUBCUTANEOUS | Status: AC
  Administered 2024-08-05: 20 [IU] via SUBCUTANEOUS
  Filled 2024-08-05: qty 0.2

## 2024-08-05 MED ORDER — SODIUM CHLORIDE 0.9 % IV BOLUS
1000.0000 mL | Freq: Once | INTRAVENOUS | Status: AC
  Administered 2024-08-05: 1000 mL via INTRAVENOUS

## 2024-08-05 MED ORDER — LANTUS SOLOSTAR 100 UNIT/ML ~~LOC~~ SOPN: 20.0000 [IU] | PEN_INJECTOR | Freq: Every day | SUBCUTANEOUS | 11 refills | Status: DC

## 2024-08-05 MED ORDER — INSULIN ASPART 100 UNIT/ML IJ SOLN
5.0000 [IU] | Freq: Once | INTRAMUSCULAR | Status: AC
  Administered 2024-08-05: 5 [IU] via INTRAVENOUS
  Filled 2024-08-05: qty 5

## 2024-08-05 MED ORDER — PEN NEEDLES 31G X 5 MM MISC: 1.0000 | Freq: Three times a day (TID) | 0 refills | Status: DC

## 2024-08-05 MED ORDER — INSULIN LISPRO (1 UNIT DIAL) 100 UNIT/ML (KWIKPEN): 8.0000 [IU] | PEN_INJECTOR | Freq: Three times a day (TID) | SUBCUTANEOUS | 11 refills | Status: DC

## 2024-08-05 MED ORDER — INSULIN ASPART 100 UNIT/ML IV SOLN
10.0000 [IU] | Freq: Once | INTRAVENOUS | Status: AC
  Administered 2024-08-05: 10 [IU] via INTRAVENOUS
  Filled 2024-08-05: qty 10

## 2024-08-05 NOTE — ED Notes (Addendum)
 Patient alert and oriented x 4. Airway patent, respirations even and unlabored. Skin normal, warm and dry. Discharge instructions discussed with patient and patient's family. Patient has no questions at this time. Patient has safe ride home. Patient given bus pass.

## 2024-08-05 NOTE — Discharge Instructions (Signed)
 Please pick up your insulin  prescriptions and do not allow yourself to run out of insulin  at any time.  I suggest you get future insulin  prescriptions refilled at least a week before you actually need to have the medication.  Return to the emergency department if you are having any problems.

## 2024-08-05 NOTE — ED Triage Notes (Signed)
" °  Patient BIB EMS for hyperglycemia and emesis that has been going on for a couple days.  Patient states he has not had any of his insulin  in the past two days.  CBG >600 on arrival.  Endorsing N/V since yesterday and abdominal pain.  Pain 6/10, sharp. "

## 2024-08-05 NOTE — ED Provider Notes (Signed)
 "  EMERGENCY DEPARTMENT AT [facility] Provider Note   CSN: [id number] Arrival date & time: 08/05/24  9964     Patient presents with: Hyperglycemia and Emesis   [name] [name] is a 39 y.o. male.   The history is provided by the patient.  Hyperglycemia Associated symptoms: vomiting   Emesis  He has history of hypertension, diabetes, hyperlipidemia and comes in because of elevated blood sugar.  He states that his glucose has been high for the last 2 days and he has not been able to get it down.  He also ran out of insulin  with his last dose being yesterday.  He had vomited 2 days ago, but has not had any nausea or vomiting since then.  He denies any abdominal pain.  Denies fever, chills, sweats.    Prior to Admission medications  Medication Sig Start Date End Date Taking? Authorizing Provider  amLODipine  (NORVASC ) 10 MG tablet Take 1 tablet (10 mg total) by mouth daily. 04/06/24 06/09/24  Leotis Bogus, MD  hydrALAZINE  (APRESOLINE ) 25 MG tablet Take 1 tablet (25 mg total) by mouth every 8 (eight) hours. Patient not taking: Reported on 05/24/2024 04/05/24 05/24/24  Leotis Bogus, MD  insulin  glargine (LANTUS  SOLOSTAR) 100 UNIT/ML Solostar Pen Inject 20 Units into the skin daily. 06/17/24   Midge Golas, MD  insulin  lispro (HUMALOG  KWIKPEN) 100 UNIT/ML KwikPen Inject 8 Units into the skin 3 (three) times daily. 06/17/24   Midge Golas, MD  Insulin  Pen Needle (PEN NEEDLES) 31G X 5 MM MISC Use  three times daily 05/05/24   Vernon Ranks, MD  Lancet Device MISC Use 3 (three) times daily. 05/05/24   Vernon Ranks, MD  Lancets MISC Use to check blood sugar three times daily 05/05/24   Vernon Ranks, MD    Allergies: Patient has no known allergies.    Review of Systems  Gastrointestinal:  Positive for vomiting.  All other systems reviewed and are negative.   Updated Vital Signs BP (!) 148/93 (BP Location: Right Arm)   Pulse 94   Temp (!) 97.4 F (36.3 C)  (Oral)   Resp 18   Ht 5' 11 (1.803 m)   Wt 99.8 kg   SpO2 95%   BMI 30.68 kg/m   Physical Exam Vitals and nursing note reviewed.   39 year old male, resting comfortably and in no acute distress. Vital signs are significant for mildly elevated blood pressure. Oxygen saturation is 95%, which is normal. Head is normocephalic and atraumatic. PERRLA, EOMI. mucous membranes are dry. Neck is nontender and supple without adenopathy. Lungs are clear without rales, wheezes, or rhonchi. Chest is nontender. Heart has regular rate and rhythm without murmur. Abdomen is soft, flat, nontender. Extremities have no cyanosis or edema, full range of motion is present. Skin is warm and dry without rash. Neurologic: Awake and alert, moves all extremities equally.  (all labs ordered are listed, but only abnormal results are displayed) Labs Reviewed  URINALYSIS, ROUTINE W REFLEX MICROSCOPIC - Abnormal; Notable for the following components:      Result Value   Color, Urine COLORLESS (*)    Glucose, UA >=500 (*)    All other components within normal limits  COMPREHENSIVE METABOLIC PANEL WITH GFR - Abnormal; Notable for the following components:   Sodium 120 (*)    Potassium 5.2 (*)    Chloride 84 (*)    Glucose, Bld 983 (*)    Creatinine, Ser 1.81 (*)    GFR, Estimated  48 (*)    All other components within normal limits  BLOOD GAS, VENOUS - Abnormal; Notable for the following components:   pCO2, Ven 43 (*)    pO2, Ven 62 (*)    Acid-Base Excess 2.2 (*)    All other components within normal limits  CBG MONITORING, ED - Abnormal; Notable for the following components:   Glucose-Capillary >600 (*)    All other components within normal limits  CBG MONITORING, ED - Abnormal; Notable for the following components:   Glucose-Capillary 412 (*)    All other components within normal limits  I-STAT CHEM 8, ED - Abnormal; Notable for the following components:   Sodium 122 (*)    Chloride 87 (*)    BUN 21  (*)    Creatinine, Ser 1.70 (*)    Glucose, Bld >700 (*)    Calcium , Ion 1.08 (*)    All other components within normal limits  CBG MONITORING, ED - Abnormal; Notable for the following components:   Glucose-Capillary 261 (*)    All other components within normal limits  CBC  BETA-HYDROXYBUTYRIC ACID    EKG: EKG Interpretation Date/Time:  Thursday August 05 2024 00:50:29 EST Ventricular Rate:  94 PR Interval:  142 QRS Duration:  121 QT Interval:  368 QTC Calculation: 461 R Axis:   -68  Text Interpretation: Sinus rhythm Nonspecific IVCD with LAD Consider anterior infarct When compared with ECG of 07/16/2024, No significant change was found Confirmed by Raford Lenis ([id number]) on 08/05/2024 1:11:48 AM    Procedures   Medications Ordered in the ED - No data to display                                  Medical Decision Making Amount and/or Complexity of Data Reviewed Labs: ordered.  Risk OTC drugs. Prescription drug management.   Hyperglycemia at least partly secondary to running out of insulin .  Differential diagnosis includes ketoacidosis, hyperosmolar nonketotic state, simple hyperglycemia.  I have reviewed his initial laboratory tests, and my interpretation is marked hyperglycemia, hyponatremia likely appropriate for degree of hyperglycemia, renal insufficiency, normal CBC, normal CO2 on i-STAT so he likely does not have ketoacidosis.  Comprehensive metabolic panel is pending.  I have added venous blood gas and beta hydroxybutyrate level to the workup.  I have ordered IV fluids and insulin .  I reviewed his past records, and he has numerous ED visits for hyperglycemia most recently on 07/16/2024.  He also has a hospital admissions for hyperglycemia most recently on 04/04/2024, and admissions for hyperosmolar hyperglycemic state most recently on 01/01/2023.  I see no admissions for ketoacidosis.  I have reviewed his laboratory tests, and my interpretation is urinalysis significant  only for glucose, marked hyperglycemia with glucose 983, hyponatremia appropriate to degree of hyperglycemia, elevated creatinine slightly higher than baseline, will need to be followed as an outpatient, normal CBC.  Glucose has come down dramatically with glucose of 412 following fluids and insulin .  I ordered additional insulin  and glucoses come down to 261.  I am discharging him with prescription for his insulin  and insulin  needles, instructed him to get prescription refilled at least 1 week before he actually needs it.  CRITICAL CARE Performed by: Lenis Raford Total critical care time: 60 minutes Critical care time was exclusive of separately billable procedures and treating other patients. Critical care was necessary to treat or prevent imminent or life-threatening deterioration. Critical care was  time spent personally by me on the following activities: development of treatment plan with patient and/or surrogate as well as nursing, discussions with consultants, evaluation of patient's response to treatment, examination of patient, obtaining history from patient or surrogate, ordering and performing treatments and interventions, ordering and review of laboratory studies, ordering and review of radiographic studies, pulse oximetry and re-evaluation of patient's condition.     Final diagnoses:  Hyperglycemia  Renal insufficiency    ED Discharge Orders     None          Raford Lenis, MD 08/05/24 [id number]  "

## 2024-08-05 NOTE — ED Notes (Signed)
" °  Patient has drank about 600 ml of water .  "

## 2024-08-11 ENCOUNTER — Other Ambulatory Visit: Payer: Self-pay

## 2024-08-11 ENCOUNTER — Encounter (HOSPITAL_COMMUNITY): Payer: Self-pay | Admitting: Emergency Medicine

## 2024-08-11 ENCOUNTER — Emergency Department (HOSPITAL_COMMUNITY)
Admission: EM | Admit: 2024-08-11 | Discharge: 2024-08-11 | Disposition: A | Discharge status: Home / Self Care | Attending: Emergency Medicine | Admitting: Emergency Medicine

## 2024-08-11 DIAGNOSIS — Z5900 Homelessness unspecified: Secondary | ICD-10-CM | POA: Insufficient documentation

## 2024-08-11 DIAGNOSIS — Z794 Long term (current) use of insulin: Secondary | ICD-10-CM | POA: Diagnosis not present

## 2024-08-11 DIAGNOSIS — R739 Hyperglycemia, unspecified: Secondary | ICD-10-CM | POA: Insufficient documentation

## 2024-08-11 LAB — COMPREHENSIVE METABOLIC PANEL WITH GFR
ALT: 18 U/L (ref 0–44)
AST: 27 U/L (ref 15–41)
Albumin: 3.7 g/dL (ref 3.5–5.0)
Alkaline Phosphatase: 97 U/L (ref 38–126)
Anion gap: 10 (ref 5–15)
BUN: 23 mg/dL — ABNORMAL HIGH (ref 6–20)
CO2: 25 mmol/L (ref 22–32)
Calcium: 8.9 mg/dL (ref 8.9–10.3)
Chloride: 95 mmol/L — ABNORMAL LOW (ref 98–111)
Creatinine, Ser: 1.66 mg/dL — ABNORMAL HIGH (ref 0.61–1.24)
GFR, Estimated: 53 mL/min — ABNORMAL LOW
Glucose, Bld: 672 mg/dL (ref 70–99)
Potassium: 4.8 mmol/L (ref 3.5–5.1)
Sodium: 129 mmol/L — ABNORMAL LOW (ref 135–145)
Total Bilirubin: 0.4 mg/dL (ref 0.0–1.2)
Total Protein: 6.6 g/dL (ref 6.5–8.1)

## 2024-08-11 LAB — CBC
HCT: 42.5 % (ref 39.0–52.0)
Hemoglobin: 14.7 g/dL (ref 13.0–17.0)
MCH: 28.8 pg (ref 26.0–34.0)
MCHC: 34.6 g/dL (ref 30.0–36.0)
MCV: 83.3 fL (ref 80.0–100.0)
Platelets: 211 K/uL (ref 150–400)
RBC: 5.1 MIL/uL (ref 4.22–5.81)
RDW: 12.3 % (ref 11.5–15.5)
WBC: 4.5 K/uL (ref 4.0–10.5)
nRBC: 0 % (ref 0.0–0.2)

## 2024-08-11 LAB — URINALYSIS, ROUTINE W REFLEX MICROSCOPIC
Bacteria, UA: NONE SEEN
Bilirubin Urine: NEGATIVE
Glucose, UA: >=500 mg/dL — AB
Hgb urine dipstick: NEGATIVE
Ketones, ur: NEGATIVE mg/dL
Leukocytes,Ua: NEGATIVE
Nitrite: NEGATIVE
Protein, ur: NEGATIVE mg/dL
Specific Gravity, Urine: 1.022 (ref 1.005–1.030)
pH: 7 (ref 5.0–8.0)

## 2024-08-11 LAB — I-STAT CHEM 8, ED
BUN: 25 mg/dL — ABNORMAL HIGH (ref 6–20)
Calcium, Ion: 1.12 mmol/L — ABNORMAL LOW (ref 1.15–1.40)
Chloride: 95 mmol/L — ABNORMAL LOW (ref 98–111)
Creatinine, Ser: 1.6 mg/dL — ABNORMAL HIGH (ref 0.61–1.24)
Glucose, Bld: 666 mg/dL (ref 70–99)
HCT: 43 % (ref 39.0–52.0)
Hemoglobin: 14.6 g/dL (ref 13.0–17.0)
Potassium: 4.6 mmol/L (ref 3.5–5.1)
Sodium: 130 mmol/L — ABNORMAL LOW (ref 135–145)
TCO2: 26 mmol/L (ref 22–32)

## 2024-08-11 LAB — URINE DRUG SCREEN
Amphetamines: POSITIVE — AB
Barbiturates: NEGATIVE
Benzodiazepines: NEGATIVE
Cocaine: NEGATIVE
Fentanyl: NEGATIVE
Methadone Scn, Ur: NEGATIVE
Opiates: NEGATIVE
Tetrahydrocannabinol: NEGATIVE

## 2024-08-11 LAB — CBG MONITORING, ED
Glucose-Capillary: >600 mg/dL (ref 70–99)
Glucose-Capillary: 426 mg/dL — ABNORMAL HIGH (ref 70–99)
Glucose-Capillary: 270 mg/dL — ABNORMAL HIGH (ref 70–99)

## 2024-08-11 LAB — BETA-HYDROXYBUTYRIC ACID: Beta-Hydroxybutyric Acid: 0.12 mmol/L (ref 0.05–0.27)

## 2024-08-11 LAB — ETHANOL: Alcohol, Ethyl (B): <15 mg/dL

## 2024-08-11 MED ORDER — SODIUM CHLORIDE 0.9 % IV SOLN: Freq: Once | INTRAVENOUS | Status: AC

## 2024-08-11 MED ORDER — SODIUM CHLORIDE 0.9 % IV BOLUS
1000.0000 mL | Freq: Once | INTRAVENOUS | Status: AC
  Administered 2024-08-11: 1000 mL via INTRAVENOUS

## 2024-08-11 MED ORDER — INSULIN ASPART 100 UNIT/ML IJ SOLN
10.0000 [IU] | Freq: Once | INTRAMUSCULAR | Status: AC
  Administered 2024-08-11: 10 [IU] via SUBCUTANEOUS
  Filled 2024-08-11: qty 10

## 2024-08-11 MED ORDER — INSULIN GLARGINE 100 UNIT/ML ~~LOC~~ SOLN
20.0000 [IU] | Freq: Once | SUBCUTANEOUS | Status: AC
  Administered 2024-08-11: 20 [IU] via SUBCUTANEOUS
  Filled 2024-08-11: qty 0.2

## 2024-08-11 NOTE — Discharge Instructions (Addendum)
 Return for any problem.   Remember to take your insulin  everyday.

## 2024-08-11 NOTE — ED Triage Notes (Signed)
 BIB EMS from [employer].  CBG 575 with EMS.  Somnolent,  Answered questions for EMS appropriately  Pupils 1-2. Denies ETOH or drug use.  VSS  RR 18 Did report taking his insulin  this morning  15/0/90, pulse 78, 97% on RA

## 2024-08-11 NOTE — ED Provider Notes (Signed)
 " [place] EMERGENCY DEPARTMENT AT [facility] Provider Note   CSN: [id number] Arrival date & time: 08/11/24  1814     Patient presents with: Hyperglycemia   [name] [name] is a 39 y.o. male.   39 year old male with prior medical history as detailed below presents with complaint of hyperglycemia.  He apparently was at [employer].  He is currently homeless.  EMS reports that sugar was 575.  Patient is not sure if he took his insulin  this morning.  Patient denies any recent alcohol, cocaine, heroin, other drug use.  Patient is appropriate alert and oriented.  He is without specific complaint.  The history is provided by the patient and medical records.       Prior to Admission medications  Medication Sig Start Date End Date Taking? Authorizing Provider  amLODipine  (NORVASC ) 10 MG tablet Take 1 tablet (10 mg total) by mouth daily. 04/06/24 06/09/24  Leotis Bogus, MD  hydrALAZINE  (APRESOLINE ) 25 MG tablet Take 1 tablet (25 mg total) by mouth every 8 (eight) hours. Patient not taking: Reported on 05/24/2024 04/05/24 05/24/24  Leotis Bogus, MD  insulin  glargine (LANTUS  SOLOSTAR) 100 UNIT/ML Solostar Pen Inject 20 Units into the skin daily. 08/05/24   Raford Lenis, MD  insulin  lispro (HUMALOG  KWIKPEN) 100 UNIT/ML KwikPen Inject 8 Units into the skin 3 (three) times daily. 08/05/24   Raford Lenis, MD  Insulin  Pen Needle (PEN NEEDLES) 31G X 5 MM MISC Use  three times daily 08/05/24   Raford Lenis, MD  Lancet Device MISC Use 3 (three) times daily. 05/05/24   Vernon Ranks, MD  Lancets MISC Use to check blood sugar three times daily 05/05/24   Vernon Ranks, MD    Allergies: Patient has no known allergies.    Review of Systems  All other systems reviewed and are negative.   Updated Vital Signs BP (!) 145/93 (BP Location: Left Arm)   Pulse 87   Temp 98.6 F (37 C) (Oral)   SpO2 100%   Physical Exam Vitals and nursing note reviewed.  Constitutional:      General: He  is not in acute distress.    Appearance: He is well-developed.  HENT:     Head: Normocephalic and atraumatic.  Eyes:     Conjunctiva/sclera: Conjunctivae normal.  Cardiovascular:     Rate and Rhythm: Normal rate and regular rhythm.     Heart sounds: No murmur heard. Pulmonary:     Effort: Pulmonary effort is normal. No respiratory distress.     Breath sounds: Normal breath sounds.  Abdominal:     Palpations: Abdomen is soft.     Tenderness: There is no abdominal tenderness.  Musculoskeletal:        General: No swelling.     Cervical back: Neck supple.  Skin:    General: Skin is warm and dry.     Capillary Refill: Capillary refill takes less than 2 seconds.  Neurological:     Mental Status: He is alert.  Psychiatric:        Mood and Affect: Mood normal.     (all labs ordered are listed, but only abnormal results are displayed) Labs Reviewed  CBG MONITORING, ED - Abnormal; Notable for the following components:      Result Value   Glucose-Capillary >600 (*)    All other components within normal limits  CBC  URINALYSIS, ROUTINE W REFLEX MICROSCOPIC  COMPREHENSIVE METABOLIC PANEL WITH GFR  BETA-HYDROXYBUTYRIC ACID  URINE DRUG SCREEN  ETHANOL  EKG: None  Radiology: No results found.   Procedures   Medications Ordered in the ED  sodium chloride  0.9 % bolus 1,000 mL (has no administration in time range)  insulin  aspart (novoLOG ) injection 10 Units (has no administration in time range)                                    Medical Decision Making Patient with significant hyperglycemia.  Patient's elevated glucose is likely from noncompliance with previously prescribed insulin .  Labs reveal elevated glucose without other significant metabolic abnormality.  With IV fluids, insulin  his glucose is significantly improved.  He is now appropriate for discharge.  Patient is advised to actually take his insulin  every day as previously instructed.  Importance of close  follow-up stressed.  Strict return precautions given and understood.  Amount and/or Complexity of Data Reviewed Labs: ordered.  Risk Prescription drug management.        Final diagnoses:  Hyperglycemia    ED Discharge Orders     None          Laurice Maude BROCKS, MD 08/11/24 2244  "

## 2024-08-17 ENCOUNTER — Other Ambulatory Visit: Payer: Self-pay

## 2024-08-17 ENCOUNTER — Emergency Department (HOSPITAL_COMMUNITY)
Admission: EM | Admit: 2024-08-17 | Discharge: 2024-08-18 | Disposition: A | Attending: Emergency Medicine | Admitting: Emergency Medicine

## 2024-08-17 DIAGNOSIS — E1165 Type 2 diabetes mellitus with hyperglycemia: Secondary | ICD-10-CM | POA: Insufficient documentation

## 2024-08-17 DIAGNOSIS — I10 Essential (primary) hypertension: Secondary | ICD-10-CM | POA: Insufficient documentation

## 2024-08-17 DIAGNOSIS — R739 Hyperglycemia, unspecified: Secondary | ICD-10-CM

## 2024-08-17 DIAGNOSIS — Z794 Long term (current) use of insulin: Secondary | ICD-10-CM | POA: Insufficient documentation

## 2024-08-17 DIAGNOSIS — Z79899 Other long term (current) drug therapy: Secondary | ICD-10-CM | POA: Diagnosis not present

## 2024-08-17 DIAGNOSIS — M25571 Pain in right ankle and joints of right foot: Secondary | ICD-10-CM | POA: Diagnosis not present

## 2024-08-17 LAB — URINALYSIS, ROUTINE W REFLEX MICROSCOPIC
Bacteria, UA: NONE SEEN
Bilirubin Urine: NEGATIVE
Glucose, UA: >=500 mg/dL — AB
Ketones, ur: NEGATIVE mg/dL
Leukocytes,Ua: NEGATIVE
Nitrite: NEGATIVE
Protein, ur: NEGATIVE mg/dL
Specific Gravity, Urine: 1.029 (ref 1.005–1.030)
pH: 5 (ref 5.0–8.0)

## 2024-08-17 LAB — CBC WITH DIFFERENTIAL/PLATELET
Abs Immature Granulocytes: 0.06 K/uL (ref 0.00–0.07)
Basophils Absolute: 0 K/uL (ref 0.0–0.1)
Basophils Relative: 0 %
Eosinophils Absolute: 0 K/uL (ref 0.0–0.5)
Eosinophils Relative: 0 %
HCT: 44.7 % (ref 39.0–52.0)
Hemoglobin: 15.5 g/dL (ref 13.0–17.0)
Immature Granulocytes: 0 %
Lymphocytes Relative: 8 %
Lymphs Abs: 1.1 K/uL (ref 0.7–4.0)
MCH: 28.9 pg (ref 26.0–34.0)
MCHC: 34.7 g/dL (ref 30.0–36.0)
MCV: 83.2 fL (ref 80.0–100.0)
Monocytes Absolute: 0.9 K/uL (ref 0.1–1.0)
Monocytes Relative: 6 %
Neutro Abs: 12 K/uL — ABNORMAL HIGH (ref 1.7–7.7)
Neutrophils Relative %: 86 %
Platelets: 257 K/uL (ref 150–400)
RBC: 5.37 MIL/uL (ref 4.22–5.81)
RDW: 12.3 % (ref 11.5–15.5)
WBC: 14.1 K/uL — ABNORMAL HIGH (ref 4.0–10.5)
nRBC: 0 % (ref 0.0–0.2)

## 2024-08-17 LAB — COMPREHENSIVE METABOLIC PANEL WITH GFR
ALT: 14 U/L (ref 0–44)
AST: 20 U/L (ref 15–41)
Albumin: 4.1 g/dL (ref 3.5–5.0)
Alkaline Phosphatase: 104 U/L (ref 38–126)
Anion gap: 15 (ref 5–15)
BUN: 11 mg/dL (ref 6–20)
CO2: 23 mmol/L (ref 22–32)
Calcium: 9.8 mg/dL (ref 8.9–10.3)
Chloride: 91 mmol/L — ABNORMAL LOW (ref 98–111)
Creatinine, Ser: 1.38 mg/dL — ABNORMAL HIGH (ref 0.61–1.24)
GFR, Estimated: >60 mL/min
Glucose, Bld: 740 mg/dL (ref 70–99)
Potassium: 4.2 mmol/L (ref 3.5–5.1)
Sodium: 129 mmol/L — ABNORMAL LOW (ref 135–145)
Total Bilirubin: 0.9 mg/dL (ref 0.0–1.2)
Total Protein: 7.3 g/dL (ref 6.5–8.1)

## 2024-08-17 LAB — CBG MONITORING, ED: Glucose-Capillary: >600 mg/dL (ref 70–99)

## 2024-08-17 MED ORDER — SODIUM CHLORIDE 0.9 % IV BOLUS
1000.0000 mL | Freq: Once | INTRAVENOUS | Status: AC
  Administered 2024-08-17: 1000 mL via INTRAVENOUS

## 2024-08-17 MED ORDER — INSULIN ASPART 100 UNIT/ML IJ SOLN
10.0000 [IU] | Freq: Once | INTRAMUSCULAR | Status: AC
  Administered 2024-08-18: 10 [IU] via SUBCUTANEOUS
  Filled 2024-08-17: qty 10

## 2024-08-17 NOTE — ED Provider Notes (Signed)
 " [place] EMERGENCY DEPARTMENT AT [facility] Provider Note   CSN: [id number] Arrival date & time: 08/17/24  2135     Patient presents with: Hyperglycemia   [name] [name] is a 39 y.o. male.  {Add pertinent medical, surgical, social history, OB history to YEP:67052} The history is provided by the patient and medical records.  Hyperglycemia  39 year old male with history of poorly controlled diabetes, polysubstance abuse, hypertension, hyperlipidemia, presenting to the ED with hyperglycemia.  He has reportedly not taken his insulin  in the past 2 days.  He was also noted to be eating giant [employer] cups on EMS arrival.  CBG >600 on arrival to ED.  He is well known to the ED for same.  Prior to Admission medications  Medication Sig Start Date End Date Taking? Authorizing Provider  amLODipine  (NORVASC ) 10 MG tablet Take 1 tablet (10 mg total) by mouth daily. 04/06/24 06/09/24  Leotis Bogus, MD  hydrALAZINE  (APRESOLINE ) 25 MG tablet Take 1 tablet (25 mg total) by mouth every 8 (eight) hours. Patient not taking: Reported on 05/24/2024 04/05/24 05/24/24  Leotis Bogus, MD  insulin  glargine (LANTUS  SOLOSTAR) 100 UNIT/ML Solostar Pen Inject 20 Units into the skin daily. 08/05/24   Raford Lenis, MD  insulin  lispro (HUMALOG  KWIKPEN) 100 UNIT/ML KwikPen Inject 8 Units into the skin 3 (three) times daily. 08/05/24   Raford Lenis, MD  Insulin  Pen Needle (PEN NEEDLES) 31G X 5 MM MISC Use  three times daily 08/05/24   Raford Lenis, MD  Lancet Device MISC Use 3 (three) times daily. 05/05/24   Vernon Ranks, MD  Lancets MISC Use to check blood sugar three times daily 05/05/24   Vernon Ranks, MD    Allergies: Patient has no known allergies.    Review of Systems  Endocrine:       Hyperglycemia  All other systems reviewed and are negative.   Updated Vital Signs BP 135/86 (BP Location: Left Arm)   Pulse (!) 109   Temp 98.2 F (36.8 C) (Oral)   Resp 18   SpO2 99%   Physical Exam Vitals and  nursing note reviewed.  Constitutional:      Appearance: He is well-developed.  HENT:     Head: Normocephalic and atraumatic.  Eyes:     Conjunctiva/sclera: Conjunctivae normal.     Pupils: Pupils are equal, round, and reactive to light.  Cardiovascular:     Rate and Rhythm: Normal rate and regular rhythm.     Heart sounds: Normal heart sounds.  Pulmonary:     Effort: Pulmonary effort is normal.     Breath sounds: Normal breath sounds.  Abdominal:     General: Bowel sounds are normal.     Palpations: Abdomen is soft.  Musculoskeletal:        General: Normal range of motion.     Cervical back: Normal range of motion.  Skin:    General: Skin is warm and dry.  Neurological:     Mental Status: He is alert and oriented to person, place, and time.     (all labs ordered are listed, but only abnormal results are displayed) Labs Reviewed  CBC WITH DIFFERENTIAL/PLATELET - Abnormal; Notable for the following components:      Result Value   WBC 14.1 (*)    Neutro Abs 12.0 (*)    All other components within normal limits  COMPREHENSIVE METABOLIC PANEL WITH GFR - Abnormal; Notable for the following components:   Sodium 129 (*)  Chloride 91 (*)    Glucose, Bld 740 (*)    Creatinine, Ser 1.38 (*)    All other components within normal limits  URINALYSIS, ROUTINE W REFLEX MICROSCOPIC - Abnormal; Notable for the following components:   APPearance HAZY (*)    Glucose, UA >=500 (*)    Hgb urine dipstick SMALL (*)    All other components within normal limits  CBG MONITORING, ED - Abnormal; Notable for the following components:   Glucose-Capillary >600 (*)    All other components within normal limits  BETA-HYDROXYBUTYRIC ACID  BLOOD GAS, VENOUS    EKG: None  Radiology: No results found.  {Document cardiac monitor, telemetry assessment procedure when appropriate:32947} Procedures   Medications Ordered in the ED  sodium chloride  0.9 % bolus 1,000 mL (has no administration in  time range)      {Click here for ABCD2, HEART and other calculators REFRESH Note before signing:1}                              Medical Decision Making Amount and/or Complexity of Data Reviewed Labs: ordered. ECG/medicine tests: ordered and independent interpretation performed.  Risk Prescription drug management.   39 year old male here with hypoglycemia.  He is very poorly controlled diabetes, has not taken his home insulin  in 2 days and was eating giant [employer] cups upon EMS arrival.  He is in no acute distress.  Labs with glucose of 740 but maintains normal bicarb and anion gap.  No ketones in the urine.  Clinically this is not consistent with DKA.  He is given fluid bolus and dose of insulin .  Will monitor progression of glucose.  Final diagnoses:  None    ED Discharge Orders     None        "

## 2024-08-17 NOTE — ED Triage Notes (Signed)
 Patient BIB EMS c/o hyperglycemia and flank pain x 1 day. Per EMS report, patient was eating a [employer] chocolate upon their arrival. Patient reports he has not taken his insulin  for the past 2 days.Patient denies N/V.   BP 150/90 HR 106 RR 20 O2sat 100% on RA CBG 575

## 2024-08-18 ENCOUNTER — Emergency Department (HOSPITAL_COMMUNITY)
Admission: EM | Admit: 2024-08-18 | Discharge: 2024-08-19 | Disposition: A | Discharge status: Home / Self Care | Attending: Emergency Medicine | Admitting: Emergency Medicine

## 2024-08-18 ENCOUNTER — Emergency Department (HOSPITAL_COMMUNITY)

## 2024-08-18 ENCOUNTER — Other Ambulatory Visit: Payer: Self-pay

## 2024-08-18 ENCOUNTER — Encounter (HOSPITAL_COMMUNITY): Payer: Self-pay | Admitting: Emergency Medicine

## 2024-08-18 DIAGNOSIS — I10 Essential (primary) hypertension: Secondary | ICD-10-CM | POA: Insufficient documentation

## 2024-08-18 DIAGNOSIS — R739 Hyperglycemia, unspecified: Secondary | ICD-10-CM

## 2024-08-18 DIAGNOSIS — Z79899 Other long term (current) drug therapy: Secondary | ICD-10-CM | POA: Insufficient documentation

## 2024-08-18 DIAGNOSIS — E1165 Type 2 diabetes mellitus with hyperglycemia: Secondary | ICD-10-CM | POA: Insufficient documentation

## 2024-08-18 DIAGNOSIS — M25571 Pain in right ankle and joints of right foot: Secondary | ICD-10-CM | POA: Insufficient documentation

## 2024-08-18 DIAGNOSIS — Z794 Long term (current) use of insulin: Secondary | ICD-10-CM | POA: Insufficient documentation

## 2024-08-18 LAB — CBC
HCT: 42.7 % (ref 39.0–52.0)
Hemoglobin: 14.7 g/dL (ref 13.0–17.0)
MCH: 28.9 pg (ref 26.0–34.0)
MCHC: 34.4 g/dL (ref 30.0–36.0)
MCV: 83.9 fL (ref 80.0–100.0)
Platelets: 231 K/uL (ref 150–400)
RBC: 5.09 MIL/uL (ref 4.22–5.81)
RDW: 12.3 % (ref 11.5–15.5)
WBC: 8.4 K/uL (ref 4.0–10.5)
nRBC: 0 % (ref 0.0–0.2)

## 2024-08-18 LAB — CBG MONITORING, ED: Glucose-Capillary: 431 mg/dL — ABNORMAL HIGH (ref 70–99)

## 2024-08-18 LAB — BETA-HYDROXYBUTYRIC ACID: Beta-Hydroxybutyric Acid: 0.13 mmol/L (ref 0.05–0.27)

## 2024-08-18 NOTE — ED Notes (Signed)
 Patient observed eating [employer]ese[employer] in room

## 2024-08-18 NOTE — ED Triage Notes (Signed)
 Pt reports right ankle pain. Denies any injuries to area. Also reports SI w/ plan to stop taking insulin .

## 2024-08-18 NOTE — Discharge Instructions (Signed)
 You need to watch your carb and sugar intake, stop eating chocolate is much as you have been. Your insulin  has already been refilled and is at the pharmacy, there are multiple refills there for you. I recommend that you follow-up closely with your primary care doctor. Return here for new concerns.

## 2024-08-19 ENCOUNTER — Other Ambulatory Visit: Payer: Self-pay

## 2024-08-19 ENCOUNTER — Emergency Department (HOSPITAL_COMMUNITY)
Admission: EM | Admit: 2024-08-19 | Discharge: 2024-08-21 | Disposition: A | Attending: Emergency Medicine | Admitting: Emergency Medicine

## 2024-08-19 ENCOUNTER — Encounter (HOSPITAL_COMMUNITY): Payer: Self-pay

## 2024-08-19 DIAGNOSIS — F1520 Other stimulant dependence, uncomplicated: Secondary | ICD-10-CM

## 2024-08-19 DIAGNOSIS — Z794 Long term (current) use of insulin: Secondary | ICD-10-CM | POA: Insufficient documentation

## 2024-08-19 DIAGNOSIS — R45851 Suicidal ideations: Secondary | ICD-10-CM | POA: Insufficient documentation

## 2024-08-19 DIAGNOSIS — F332 Major depressive disorder, recurrent severe without psychotic features: Secondary | ICD-10-CM

## 2024-08-19 DIAGNOSIS — E119 Type 2 diabetes mellitus without complications: Secondary | ICD-10-CM | POA: Insufficient documentation

## 2024-08-19 DIAGNOSIS — R739 Hyperglycemia, unspecified: Secondary | ICD-10-CM

## 2024-08-19 LAB — BASIC METABOLIC PANEL WITH GFR
Anion gap: 11 (ref 5–15)
BUN: 16 mg/dL (ref 6–20)
CO2: 23 mmol/L (ref 22–32)
Calcium: 9.3 mg/dL (ref 8.9–10.3)
Chloride: 102 mmol/L (ref 98–111)
Creatinine, Ser: 1.23 mg/dL (ref 0.61–1.24)
GFR, Estimated: >60 mL/min
Glucose, Bld: 122 mg/dL — ABNORMAL HIGH (ref 70–99)
Potassium: 3.2 mmol/L — ABNORMAL LOW (ref 3.5–5.1)
Sodium: 136 mmol/L (ref 135–145)

## 2024-08-19 LAB — COMPREHENSIVE METABOLIC PANEL WITH GFR
ALT: 13 U/L (ref 0–44)
AST: 16 U/L (ref 15–41)
Albumin: 3.7 g/dL (ref 3.5–5.0)
Alkaline Phosphatase: 117 U/L (ref 38–126)
Anion gap: 13 (ref 5–15)
BUN: 15 mg/dL (ref 6–20)
CO2: 22 mmol/L (ref 22–32)
Calcium: 9.2 mg/dL (ref 8.9–10.3)
Chloride: 89 mmol/L — ABNORMAL LOW (ref 98–111)
Creatinine, Ser: 1.51 mg/dL — ABNORMAL HIGH (ref 0.61–1.24)
GFR, Estimated: 60 mL/min — ABNORMAL LOW
Glucose, Bld: 891 mg/dL (ref 70–99)
Potassium: 3.5 mmol/L (ref 3.5–5.1)
Sodium: 123 mmol/L — ABNORMAL LOW (ref 135–145)
Total Bilirubin: 0.5 mg/dL (ref 0.0–1.2)
Total Protein: 6.8 g/dL (ref 6.5–8.1)

## 2024-08-19 LAB — URINE DRUG SCREEN
Amphetamines: POSITIVE — AB
Barbiturates: NEGATIVE
Benzodiazepines: NEGATIVE
Cocaine: NEGATIVE
Fentanyl: NEGATIVE
Methadone Scn, Ur: NEGATIVE
Opiates: NEGATIVE
Tetrahydrocannabinol: NEGATIVE

## 2024-08-19 LAB — CBG MONITORING, ED
Glucose-Capillary: 156 mg/dL — ABNORMAL HIGH (ref 70–99)
Glucose-Capillary: 185 mg/dL — ABNORMAL HIGH (ref 70–99)
Glucose-Capillary: 58 mg/dL — ABNORMAL LOW (ref 70–99)
Glucose-Capillary: 121 mg/dL — ABNORMAL HIGH (ref 70–99)
Glucose-Capillary: 191 mg/dL — ABNORMAL HIGH (ref 70–99)
Glucose-Capillary: 293 mg/dL — ABNORMAL HIGH (ref 70–99)
Glucose-Capillary: 372 mg/dL — ABNORMAL HIGH (ref 70–99)

## 2024-08-19 LAB — ETHANOL: Alcohol, Ethyl (B): <15 mg/dL

## 2024-08-19 MED ORDER — INSULIN ASPART 100 UNIT/ML IJ SOLN
5.0000 [IU] | Freq: Once | INTRAMUSCULAR | Status: AC
  Administered 2024-08-19: 5 [IU] via SUBCUTANEOUS
  Filled 2024-08-19: qty 5

## 2024-08-19 MED ORDER — DEXTROSE IN LACTATED RINGERS 5 % IV SOLN: INTRAVENOUS | Status: DC

## 2024-08-19 MED ORDER — LACTATED RINGERS IV SOLN: INTRAVENOUS | Status: DC

## 2024-08-19 MED ORDER — DEXTROSE 50 % IV SOLN: 0.0000 mL | INTRAVENOUS | Status: DC | PRN

## 2024-08-19 MED ORDER — SODIUM CHLORIDE 0.9 % IV BOLUS (SEPSIS)
1000.0000 mL | Freq: Once | INTRAVENOUS | Status: AC
  Administered 2024-08-19: 1000 mL via INTRAVENOUS

## 2024-08-19 MED ORDER — SODIUM CHLORIDE 0.9 % IV BOLUS
1000.0000 mL | Freq: Once | INTRAVENOUS | Status: AC
  Administered 2024-08-19: 1000 mL via INTRAVENOUS

## 2024-08-19 MED ORDER — INSULIN REGULAR(HUMAN) IN NACL 100-0.9 UT/100ML-% IV SOLN
INTRAVENOUS | Status: DC
  Filled 2024-08-19: qty 100

## 2024-08-19 NOTE — ED Triage Notes (Signed)
 Arrives [facility] from [facility] with concern of ongoing suicidal ideations with plan to take pills.   Says he was seen here for the same yesterday.

## 2024-08-19 NOTE — ED Provider Notes (Signed)
 " [place] EMERGENCY DEPARTMENT AT [facility] Provider Note   CSN: [id number] Arrival date & time: 08/19/24  2135     Patient presents with: Suicidal   [name] [name] is a 39 y.o. male.   The history is provided by the patient, the EMS personnel and medical records. No language interpreter was used.      39 year old male significant history of poorly controlled diabetes, substance-induced mood disorder, frequent ER visits, brought here via EMS with complaints of suicidal ideation.  Patient states he has suicidal thoughts.  He thought about taking pain pills taken his insulin  to kill himself.  This is not new.  No other complaint.  Please note patient has been seen and evaluated on an almost daily basis for send presentation.  Most recent ER visit for same was this morning.   Prior to Admission medications  Medication Sig Start Date End Date Taking? Authorizing Provider  amLODipine  (NORVASC ) 10 MG tablet Take 1 tablet (10 mg total) by mouth daily. 04/06/24 06/09/24  Leotis Bogus, MD  hydrALAZINE  (APRESOLINE ) 25 MG tablet Take 1 tablet (25 mg total) by mouth every 8 (eight) hours. Patient not taking: Reported on 05/24/2024 04/05/24 05/24/24  Leotis Bogus, MD  insulin  glargine (LANTUS  SOLOSTAR) 100 UNIT/ML Solostar Pen Inject 20 Units into the skin daily. 08/05/24   Raford Lenis, MD  insulin  lispro (HUMALOG  KWIKPEN) 100 UNIT/ML KwikPen Inject 8 Units into the skin 3 (three) times daily. 08/05/24   Raford Lenis, MD  Insulin  Pen Needle (PEN NEEDLES) 31G X 5 MM MISC Use  three times daily 08/05/24   Raford Lenis, MD  Lancet Device MISC Use 3 (three) times daily. 05/05/24   Vernon Ranks, MD  Lancets MISC Use to check blood sugar three times daily 05/05/24   Vernon Ranks, MD    Allergies: Patient has no known allergies.    Review of Systems  All other systems reviewed and are negative.   Updated Vital Signs BP (!) 171/110   Pulse 93   Temp 97.7 F (36.5 C)   Resp 15    SpO2 100%   Physical Exam Constitutional:      General: He is not in acute distress.    Appearance: He is well-developed.     Comments: Patient is sitting in the chair, eyes closed, in no acute discomfort.  Easily arousable, answer question appropriately  HENT:     Head: Atraumatic.  Eyes:     Conjunctiva/sclera: Conjunctivae normal.  Cardiovascular:     Rate and Rhythm: Normal rate and regular rhythm.     Pulses: Normal pulses.     Heart sounds: Normal heart sounds.  Pulmonary:     Effort: Pulmonary effort is normal.     Breath sounds: Normal breath sounds.  Abdominal:     Palpations: Abdomen is soft.  Musculoskeletal:     Cervical back: Normal range of motion and neck supple.  Skin:    Findings: No rash.  Neurological:     Mental Status: He is alert.  Psychiatric:        Mood and Affect: Mood normal.        Speech: Speech normal.        Behavior: Behavior is cooperative.        Thought Content: Thought content includes suicidal ideation. Thought content does not include homicidal ideation.     (all labs ordered are listed, but only abnormal results are displayed) Labs Reviewed - No data to display  EKG:  None  Radiology: DG Ankle Complete Right Result Date: 08/18/2024 EXAM: 3 OR MORE VIEW(S) XRAY OF THE RIGHT ANKLE 08/18/2024 11:00:29 PM CLINICAL HISTORY: pain pain pain COMPARISON: None available. FINDINGS: BONES AND JOINTS: No acute fracture. No malalignment. SOFT TISSUES: There is mild generalized soft tissue swelling. IMPRESSION: 1. Soft tissue swelling without evidence of fractures. Electronically signed by: Francis Quam MD 08/18/2024 11:07 PM EST RP Workstation: [id number]     Procedures   Medications Ordered in the ED - No data to display                                  Medical Decision Making Risk Prescription drug management.   BP (!) 171/110   Pulse 93   Temp 97.7 F (36.5 C)   Resp 15   SpO2 100%   84:28 PM 39 year old male significant  history of poorly controlled diabetes, substance-induced mood disorder, frequent ER visits, brought here via EMS with complaints of suicidal ideation.  Patient states he has suicidal thoughts.  He thought about taking pain pills taken his insulin  to kill himself.  This is not new.  No other complaint.  Please note patient has been seen and evaluated on an almost daily basis for send presentation.  Most recent ER visit for same was this morning.  On exam patient is sleeping easily arousable, answer question, does not appear to be responding to either internal or external stimuli.  Vital signs only with a blood pressure of 171/110.  Patient is afebrile no hypoxia.  EMR reviewed patient has been seen multiple times in the past for similar presentation usually diagnosed with hyperglycemia because he has poorly controlled diabetes and continues to eat high sugary food content.  Will check CBG.  CBG is 293.  Will give patient 5 units of insulin .  Do not think patient is having DKA.  Reviewed additional labs that was obtained earlier today and consider my decision making.   Unfortunately after 5 units of insulin , on a CBG recheck, his blood sugar is now 372.  Will give a liter of IV fluid as well as another 5 units of insulin .  Pt sign out to oncoming provider who will reassess pt and determine disposition      Final diagnoses:  None    ED Discharge Orders     None          Nivia Colon, PA-C 08/20/24 0007    Mannie Pac T, DO 08/20/24 2324  "

## 2024-08-19 NOTE — ED Provider Notes (Signed)
 " [place] EMERGENCY DEPARTMENT AT [facility] Provider Note   CSN: [id number] Arrival date & time: 08/18/24  2235     Patient presents with: Ankle Pain and Suicidal  Level 5 caveat due to psychiatric disorder [name] [name] is a 39 y.o. male.   The history is provided by the patient.  Patient w/history of diabetes, hypertension presents for right ankle pain of unclear etiology as well as hyperglycemia.  Patient reports he has thoughts to harm himself by not taking insulin . He denies any other complaints    Past Medical History:  Diagnosis Date   Diabetes mellitus without complication (HCC)    DKA (diabetic ketoacidosis) (HCC) 05/08/2022   HTN (hypertension) 05/08/2022   Hyperlipidemia    Hyperosmolar hyperglycemic state (HHS) (HCC) 05/08/2022   Hypertension    Hypoglycemia 07/02/2019   Pseudohyponatremia 05/08/2022    Prior to Admission medications  Medication Sig Start Date End Date Taking? Authorizing Provider  amLODipine  (NORVASC ) 10 MG tablet Take 1 tablet (10 mg total) by mouth daily. 04/06/24 06/09/24  Leotis Bogus, MD  hydrALAZINE  (APRESOLINE ) 25 MG tablet Take 1 tablet (25 mg total) by mouth every 8 (eight) hours. Patient not taking: Reported on 05/24/2024 04/05/24 05/24/24  Leotis Bogus, MD  insulin  glargine (LANTUS  SOLOSTAR) 100 UNIT/ML Solostar Pen Inject 20 Units into the skin daily. 08/05/24   Raford Lenis, MD  insulin  lispro (HUMALOG  KWIKPEN) 100 UNIT/ML KwikPen Inject 8 Units into the skin 3 (three) times daily. 08/05/24   Raford Lenis, MD  Insulin  Pen Needle (PEN NEEDLES) 31G X 5 MM MISC Use  three times daily 08/05/24   Raford Lenis, MD  Lancet Device MISC Use 3 (three) times daily. 05/05/24   Vernon Ranks, MD  Lancets MISC Use to check blood sugar three times daily 05/05/24   Vernon Ranks, MD    Allergies: Patient has no known allergies.    Review of Systems  Unable to perform ROS: Psychiatric disorder    Updated Vital Signs BP (!) 147/112  (BP Location: Left Arm)   Pulse 74   Temp 97.7 F (36.5 C) (Oral)   Resp 18   SpO2 100%   Physical Exam CONSTITUTIONAL: Disheveled, does not smell of ketones HEAD: Normocephalic/atraumatic, no visible trauma EYES: EOMI/PERRL ENMT: Mucous membranes moist NECK: supple no meningeal signs LUNGS:  no apparent distress NEURO: Pt is sleeping but is arousable Moves all 4 extremities EXTREMITIES: pulses normal/equal, full ROM Distal pulses equal and intact in both feet.  No deformities, no tenderness to either foot or ankle SKIN: warm, color normal  (all labs ordered are listed, but only abnormal results are displayed) Labs Reviewed  COMPREHENSIVE METABOLIC PANEL WITH GFR - Abnormal; Notable for the following components:      Result Value   Sodium 123 (*)    Chloride 89 (*)    Glucose, Bld 891 (*)    Creatinine, Ser 1.51 (*)    GFR, Estimated 60 (*)    All other components within normal limits  URINE DRUG SCREEN - Abnormal; Notable for the following components:   Amphetamines POSITIVE (*)    All other components within normal limits  BASIC METABOLIC PANEL WITH GFR - Abnormal; Notable for the following components:   Potassium 3.2 (*)    Glucose, Bld 122 (*)    All other components within normal limits  CBG MONITORING, ED - Abnormal; Notable for the following components:   Glucose-Capillary 185 (*)    All other components within normal limits  CBG MONITORING, ED - Abnormal; Notable for the following components:   Glucose-Capillary 156 (*)    All other components within normal limits  CBG MONITORING, ED - Abnormal; Notable for the following components:   Glucose-Capillary 58 (*)    All other components within normal limits  CBG MONITORING, ED - Abnormal; Notable for the following components:   Glucose-Capillary 121 (*)    All other components within normal limits  CBG MONITORING, ED - Abnormal; Notable for the following components:   Glucose-Capillary 191 (*)    All other  components within normal limits  ETHANOL  CBC    EKG: ED ECG REPORT   Date: 08/19/2024 0122am  Rate: 102  Rhythm: sinus tachycardia  QRS Axis: left  Intervals: normal  ST/T Wave abnormalities: normal  Conduction Disutrbances:none   I have personally reviewed the EKG tracing and agree with the computerized printout as noted.   Radiology: DG Ankle Complete Right Result Date: 08/18/2024 EXAM: 3 OR MORE VIEW(S) XRAY OF THE RIGHT ANKLE 08/18/2024 11:00:29 PM CLINICAL HISTORY: pain pain pain COMPARISON: None available. FINDINGS: BONES AND JOINTS: No acute fracture. No malalignment. SOFT TISSUES: There is mild generalized soft tissue swelling. IMPRESSION: 1. Soft tissue swelling without evidence of fractures. Electronically signed by: Francis Quam MD 08/18/2024 11:07 PM EST RP Workstation: [id number]     .Critical Care  Performed by: Midge Golas, MD Authorized by: Midge Golas, MD   Critical care provider statement:    Critical care time (minutes):  45   Critical care start time:  08/19/2024 12:45 AM   Critical care end time:  08/19/2024 1:30 AM   Critical care time was exclusive of:  Separately billable procedures and treating other patients   Critical care was necessary to treat or prevent imminent or life-threatening deterioration of the following conditions:  Dehydration and metabolic crisis   Critical care was time spent personally by me on the following activities:  Obtaining history from patient or surrogate, examination of patient, evaluation of patient's response to treatment, ordering and review of laboratory studies and re-evaluation of patient's condition   I assumed direction of critical care for this patient from another provider in my specialty: no      Medications Ordered in the ED  lactated ringers  infusion ( Intravenous New Bag/Given 08/19/24 0123)  dextrose  50 % solution 0-50 mL (has no administration in time range)  sodium chloride  0.9 % bolus 1,000 mL (0  mLs Intravenous Stopped 08/19/24 0149)    Clinical Course as of 08/19/24 0654  Thu Aug 19, 2024  0139 Patient had presented reporting he was going to stop taking his insulin  to harm himself. Initial lab draw revealed a glucose over 800.  However recheck CBG twice is under 200 We will hold off on insulin  for now.  Will give IV fluids and recheck BMP [DW]  0355 Patient mild hypoglycemic.  He is awake alert and eating Denies any use of insulin .  He did not receive any insulin  here [DW]  0538 CBG is improving.  Patient now denies any SI. Reports he has all of his medications [DW]  0653 CBG continues to improve.  Patient denies all complaints and is comfortable with discharge [DW]    Clinical Course User Index [DW] Midge Golas, MD                                 Medical Decision Making Amount and/or Complexity of  Data Reviewed Labs: ordered. Radiology: ordered.  Risk Prescription drug management.   This patient presents to the ED for concern of hyperglycemia, this involves an extensive number of treatment options, and is a complaint that carries with it a high risk of complications and morbidity.  The differential diagnosis includes but is not limited to DKA, hyperglycemia, hyperosmolar hyperglycemic state, lab error  Comorbidities that complicate the patient evaluation: Patients presentation is complicated by their history of diabetes  Social Determinants of Health: Patients multiple ER visits  increases the complexity of managing their presentation  Additional history obtained: Records reviewed previous admission documents  Lab Tests: I Ordered, and personally interpreted labs.  The pertinent results include: Significant hyperglycemia   Medicines ordered and prescription drug management: I ordered medication including IV fluids  Reevaluation of the patient after these medicines showed that the patient    improved   Critical Interventions:   fluids, close glucose  monitoring   Reevaluation: After the interventions noted above, I reevaluated the patient and found that they have :improved  Complexity of problems addressed: Patients presentation is most consistent with  severe exacerbation of chronic illness  Disposition: After consideration of the diagnostic results and the patients response to treatment,  I feel that the patent would benefit from discharge      Final diagnoses:  Hyperglycemia    ED Discharge Orders     None          Midge Golas, MD 08/19/24 [phone]  "

## 2024-08-19 NOTE — ED Notes (Signed)
 Verified patient is not SI prior to dc with provider Midge, MD. Patient given belongings, buspass, and AVS.

## 2024-08-20 ENCOUNTER — Telehealth: Payer: Self-pay | Admitting: *Deleted

## 2024-08-20 DIAGNOSIS — E1110 Type 2 diabetes mellitus with ketoacidosis without coma: Secondary | ICD-10-CM

## 2024-08-20 DIAGNOSIS — I152 Hypertension secondary to endocrine disorders: Secondary | ICD-10-CM

## 2024-08-20 DIAGNOSIS — F1520 Other stimulant dependence, uncomplicated: Secondary | ICD-10-CM

## 2024-08-20 DIAGNOSIS — F332 Major depressive disorder, recurrent severe without psychotic features: Secondary | ICD-10-CM

## 2024-08-20 DIAGNOSIS — R739 Hyperglycemia, unspecified: Secondary | ICD-10-CM

## 2024-08-20 DIAGNOSIS — E119 Type 2 diabetes mellitus without complications: Secondary | ICD-10-CM

## 2024-08-20 LAB — ETHANOL: Alcohol, Ethyl (B): <15 mg/dL

## 2024-08-20 LAB — COMPREHENSIVE METABOLIC PANEL WITH GFR
ALT: 13 U/L (ref 0–44)
AST: 18 U/L (ref 15–41)
Albumin: 3.5 g/dL (ref 3.5–5.0)
Alkaline Phosphatase: 91 U/L (ref 38–126)
Anion gap: 9 (ref 5–15)
BUN: 11 mg/dL (ref 6–20)
CO2: 25 mmol/L (ref 22–32)
Calcium: 8.8 mg/dL — ABNORMAL LOW (ref 8.9–10.3)
Chloride: 101 mmol/L (ref 98–111)
Creatinine, Ser: 0.98 mg/dL (ref 0.61–1.24)
GFR, Estimated: >60 mL/min
Glucose, Bld: 261 mg/dL — ABNORMAL HIGH (ref 70–99)
Potassium: 3.5 mmol/L (ref 3.5–5.1)
Sodium: 134 mmol/L — ABNORMAL LOW (ref 135–145)
Total Bilirubin: 0.5 mg/dL (ref 0.0–1.2)
Total Protein: 6.5 g/dL (ref 6.5–8.1)

## 2024-08-20 LAB — CBC WITH DIFFERENTIAL/PLATELET
Abs Immature Granulocytes: 0.02 K/uL (ref 0.00–0.07)
Basophils Absolute: 0 K/uL (ref 0.0–0.1)
Basophils Relative: 1 %
Eosinophils Absolute: 0.1 K/uL (ref 0.0–0.5)
Eosinophils Relative: 2 %
HCT: 43.5 % (ref 39.0–52.0)
Hemoglobin: 15 g/dL (ref 13.0–17.0)
Immature Granulocytes: 0 %
Lymphocytes Relative: 32 %
Lymphs Abs: 1.8 K/uL (ref 0.7–4.0)
MCH: 28.6 pg (ref 26.0–34.0)
MCHC: 34.5 g/dL (ref 30.0–36.0)
MCV: 82.9 fL (ref 80.0–100.0)
Monocytes Absolute: 0.5 K/uL (ref 0.1–1.0)
Monocytes Relative: 9 %
Neutro Abs: 3.2 K/uL (ref 1.7–7.7)
Neutrophils Relative %: 56 %
Platelets: 222 K/uL (ref 150–400)
RBC: 5.25 MIL/uL (ref 4.22–5.81)
RDW: 12.3 % (ref 11.5–15.5)
WBC: 5.7 K/uL (ref 4.0–10.5)
nRBC: 0 % (ref 0.0–0.2)

## 2024-08-20 LAB — URINE DRUG SCREEN
Amphetamines: POSITIVE — AB
Barbiturates: NEGATIVE
Benzodiazepines: NEGATIVE
Cocaine: NEGATIVE
Fentanyl: NEGATIVE
Methadone Scn, Ur: NEGATIVE
Opiates: NEGATIVE
Tetrahydrocannabinol: NEGATIVE

## 2024-08-20 LAB — CBG MONITORING, ED
Glucose-Capillary: 110 mg/dL — ABNORMAL HIGH (ref 70–99)
Glucose-Capillary: 188 mg/dL — ABNORMAL HIGH (ref 70–99)
Glucose-Capillary: 237 mg/dL — ABNORMAL HIGH (ref 70–99)
Glucose-Capillary: 280 mg/dL — ABNORMAL HIGH (ref 70–99)
Glucose-Capillary: 299 mg/dL — ABNORMAL HIGH (ref 70–99)
Glucose-Capillary: 310 mg/dL — ABNORMAL HIGH (ref 70–99)

## 2024-08-20 MED ORDER — AMLODIPINE BESYLATE 5 MG PO TABS
10.0000 mg | ORAL_TABLET | Freq: Every day | ORAL | Status: DC
  Administered 2024-08-20: 10 mg via ORAL
  Filled 2024-08-20: qty 2

## 2024-08-20 MED ORDER — INSULIN ASPART 100 UNIT/ML IJ SOLN: 0.0000 [IU] | INTRAMUSCULAR | Status: DC

## 2024-08-20 MED ORDER — INSULIN ASPART 100 UNIT/ML IJ SOLN
8.0000 [IU] | Freq: Three times a day (TID) | INTRAMUSCULAR | Status: DC
  Administered 2024-08-20 (×3): 8 [IU] via SUBCUTANEOUS
  Filled 2024-08-20 (×3): qty 8

## 2024-08-20 MED ORDER — INSULIN GLARGINE-YFGN 100 UNIT/ML ~~LOC~~ SOLN
20.0000 [IU] | Freq: Every day | SUBCUTANEOUS | Status: DC
  Administered 2024-08-20: 20 [IU] via SUBCUTANEOUS
  Filled 2024-08-20: qty 0.2

## 2024-08-20 MED ORDER — INSULIN ASPART 100 UNIT/ML IJ SOLN
0.0000 [IU] | Freq: Three times a day (TID) | INTRAMUSCULAR | Status: DC
  Administered 2024-08-20 (×2): 3 [IU] via SUBCUTANEOUS
  Filled 2024-08-20 (×2): qty 3

## 2024-08-20 MED ORDER — HYDRALAZINE HCL 25 MG PO TABS
25.0000 mg | ORAL_TABLET | Freq: Three times a day (TID) | ORAL | Status: DC
  Administered 2024-08-20 (×3): 25 mg via ORAL
  Filled 2024-08-20 (×3): qty 1

## 2024-08-20 MED ORDER — PEN NEEDLES 31G X 5 MM MISC: 1.0000 | Freq: Three times a day (TID) | Status: DC

## 2024-08-20 NOTE — ED Provider Notes (Signed)
 Emergency Medicine Observation Re-evaluation Note  [name] [name] is a 39 y.o. male, seen on rounds today.  Pt initially presented to the ED for complaints of Suicidal Currently, the patient is sleeping.  Physical Exam  BP (!) 171/110   Pulse 93   Temp 97.7 F (36.5 C)   Resp 15   SpO2 100%  Physical Exam General: No acute distress Cardiac: Regular rate   ED Course / MDM  EKG:   Patient holding in ED for psychiatric evaluation.  Home medications have been ordered.  Patient noted to be hypertensive this morning.  He is pending his morning dose of his hypertensive medication Plan  Current plan is for psychiatric evaluation.    Randol Simmonds, MD 08/20/24 [phone]

## 2024-08-20 NOTE — ED Notes (Signed)
 Pt belongings placed in secure cabinets #HALL B
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
## 2024-08-20 NOTE — ED Provider Notes (Signed)
 Patient continues to complain of SI.  States that he wants to kill self by either not taking his meds, or by taking too much.  He has multiple recent visits for hyperglycemia.  Will consult TTS, but appears medically clear.  Inpatient recommended per psychiatry.   Vicky Charleston, PA-C 08/20/24 0413    Griselda Norris, MD 08/20/24 [phone]

## 2024-08-20 NOTE — Inpatient Diabetes Management (Signed)
 Inpatient Diabetes Program Recommendations  AACE/ADA: New Consensus Statement on Inpatient Glycemic Control (2015)  Target Ranges:  Prepandial:   less than 140 mg/dL      Peak postprandial:   less than 180 mg/dL (1-2 hours)      Critically ill patients:  140 - 180 mg/dL   Lab Results  Component Value Date   GLUCAP 237 (H) 08/20/2024   HGBA1C >15.5 (H) 02/28/2024    Review of Glycemic Control  Latest Reference Range & Units 08/19/24 23:15 08/20/24 00:44 08/20/24 04:22 08/20/24 08:50  Glucose-Capillary 70 - 99 mg/dL 627 (H) 689 (H) 811 (H) 237 (H)  (H): Data is abnormally high Diabetes history: Type 2 DM Outpatient Diabetes medications: Lantus  20 units every day, Humalog  15 units TID Current orders for Inpatient glycemic control: Novolog  8 units TID, Semglee  20 units QD  Inpatient Diabetes Program Recommendations:    Consider also adding Novolog  0-6 units TID & HS.   Thanks, Tinnie Minus, MSN, RNC-OB Diabetes Coordinator [phone] (8a-5p)\

## 2024-08-21 ENCOUNTER — Encounter (HOSPITAL_COMMUNITY): Payer: Self-pay | Admitting: Behavioral Health

## 2024-08-21 ENCOUNTER — Inpatient Hospital Stay (HOSPITAL_COMMUNITY)
Admission: AD | Admit: 2024-08-21 | Discharge: 2024-08-25 | DRG: 881 | Disposition: A | Discharge status: Home / Self Care | Source: Other Acute Inpatient Hospital

## 2024-08-21 ENCOUNTER — Other Ambulatory Visit: Payer: Self-pay

## 2024-08-21 DIAGNOSIS — Z59819 Housing instability, housed unspecified: Secondary | ICD-10-CM | POA: Diagnosis not present

## 2024-08-21 DIAGNOSIS — R7309 Other abnormal glucose: Principal | ICD-10-CM

## 2024-08-21 DIAGNOSIS — F419 Anxiety disorder, unspecified: Secondary | ICD-10-CM | POA: Diagnosis present

## 2024-08-21 DIAGNOSIS — F17210 Nicotine dependence, cigarettes, uncomplicated: Secondary | ICD-10-CM | POA: Diagnosis present

## 2024-08-21 DIAGNOSIS — F1590 Other stimulant use, unspecified, uncomplicated: Secondary | ICD-10-CM | POA: Diagnosis present

## 2024-08-21 DIAGNOSIS — I10 Essential (primary) hypertension: Secondary | ICD-10-CM | POA: Diagnosis present

## 2024-08-21 DIAGNOSIS — Z560 Unemployment, unspecified: Secondary | ICD-10-CM | POA: Diagnosis not present

## 2024-08-21 DIAGNOSIS — E785 Hyperlipidemia, unspecified: Secondary | ICD-10-CM | POA: Diagnosis present

## 2024-08-21 DIAGNOSIS — Z608 Other problems related to social environment: Secondary | ICD-10-CM | POA: Diagnosis present

## 2024-08-21 DIAGNOSIS — R45851 Suicidal ideations: Secondary | ICD-10-CM | POA: Diagnosis present

## 2024-08-21 DIAGNOSIS — Z5941 Food insecurity: Secondary | ICD-10-CM | POA: Diagnosis not present

## 2024-08-21 DIAGNOSIS — E1065 Type 1 diabetes mellitus with hyperglycemia: Secondary | ICD-10-CM | POA: Diagnosis present

## 2024-08-21 DIAGNOSIS — L089 Local infection of the skin and subcutaneous tissue, unspecified: Secondary | ICD-10-CM | POA: Diagnosis present

## 2024-08-21 DIAGNOSIS — Z79899 Other long term (current) drug therapy: Secondary | ICD-10-CM

## 2024-08-21 DIAGNOSIS — Z794 Long term (current) use of insulin: Secondary | ICD-10-CM | POA: Diagnosis not present

## 2024-08-21 DIAGNOSIS — F4321 Adjustment disorder with depressed mood: Secondary | ICD-10-CM | POA: Diagnosis present

## 2024-08-21 DIAGNOSIS — E11622 Type 2 diabetes mellitus with other skin ulcer: Principal | ICD-10-CM

## 2024-08-21 DIAGNOSIS — Z5900 Homelessness unspecified: Secondary | ICD-10-CM | POA: Diagnosis not present

## 2024-08-21 DIAGNOSIS — Z5982 Transportation insecurity: Secondary | ICD-10-CM

## 2024-08-21 LAB — I-STAT CHEM 8, ED
BUN: 14 mg/dL (ref 6–20)
Calcium, Ion: 1.25 mmol/L (ref 1.15–1.40)
Chloride: 97 mmol/L — ABNORMAL LOW (ref 98–111)
Creatinine, Ser: 1.1 mg/dL (ref 0.61–1.24)
Glucose, Bld: 385 mg/dL — ABNORMAL HIGH (ref 70–99)
HCT: 42 % (ref 39.0–52.0)
Hemoglobin: 14.3 g/dL (ref 13.0–17.0)
Potassium: 4.3 mmol/L (ref 3.5–5.1)
Sodium: 133 mmol/L — ABNORMAL LOW (ref 135–145)
TCO2: 27 mmol/L (ref 22–32)

## 2024-08-21 LAB — GLUCOSE, CAPILLARY
Glucose-Capillary: 522 mg/dL (ref 70–99)
Glucose-Capillary: 372 mg/dL — ABNORMAL HIGH (ref 70–99)
Glucose-Capillary: 362 mg/dL — ABNORMAL HIGH (ref 70–99)
Glucose-Capillary: 501 mg/dL (ref 70–99)

## 2024-08-21 LAB — CBG MONITORING, ED
Glucose-Capillary: 244 mg/dL — ABNORMAL HIGH (ref 70–99)
Glucose-Capillary: 371 mg/dL — ABNORMAL HIGH (ref 70–99)

## 2024-08-21 MED ORDER — DIPHENHYDRAMINE HCL 50 MG/ML IJ SOLN: 50.0000 mg | Freq: Three times a day (TID) | INTRAMUSCULAR | Status: DC | PRN

## 2024-08-21 MED ORDER — HYDROCHLOROTHIAZIDE 12.5 MG PO TABS: 12.5000 mg | ORAL_TABLET | Freq: Every day | ORAL | Status: DC

## 2024-08-21 MED ORDER — HYDROCHLOROTHIAZIDE 12.5 MG PO TABS
12.5000 mg | ORAL_TABLET | Freq: Every day | ORAL | Status: AC
  Administered 2024-08-21: 12.5 mg via ORAL
  Filled 2024-08-21: qty 1

## 2024-08-21 MED ORDER — LORAZEPAM 2 MG/ML IJ SOLN: 2.0000 mg | Freq: Three times a day (TID) | INTRAMUSCULAR | Status: DC | PRN

## 2024-08-21 MED ORDER — CEPHALEXIN 500 MG PO CAPS
500.0000 mg | ORAL_CAPSULE | Freq: Two times a day (BID) | ORAL | Status: DC
  Administered 2024-08-21 – 2024-08-25 (×8): 500 mg via ORAL
  Filled 2024-08-21 (×3): qty 1
  Filled 2024-08-21: qty 8
  Filled 2024-08-21 (×6): qty 1

## 2024-08-21 MED ORDER — DIPHENHYDRAMINE HCL 25 MG PO CAPS: 50.0000 mg | ORAL_CAPSULE | Freq: Three times a day (TID) | ORAL | Status: DC | PRN

## 2024-08-21 MED ORDER — AMLODIPINE BESYLATE 5 MG PO TABS
10.0000 mg | ORAL_TABLET | Freq: Every day | ORAL | Status: DC
  Administered 2024-08-21 – 2024-08-25 (×5): 10 mg via ORAL
  Filled 2024-08-21 (×2): qty 2
  Filled 2024-08-21: qty 7
  Filled 2024-08-21 (×3): qty 2

## 2024-08-21 MED ORDER — ACETAMINOPHEN 325 MG PO TABS: 650.0000 mg | ORAL_TABLET | Freq: Four times a day (QID) | ORAL | Status: DC | PRN

## 2024-08-21 MED ORDER — INSULIN ASPART 100 UNIT/ML IJ SOLN
8.0000 [IU] | Freq: Three times a day (TID) | INTRAMUSCULAR | Status: DC
  Administered 2024-08-21 – 2024-08-23 (×8): 8 [IU] via SUBCUTANEOUS

## 2024-08-21 MED ORDER — MAGNESIUM HYDROXIDE 400 MG/5ML PO SUSP: 30.0000 mL | Freq: Every day | ORAL | Status: DC | PRN

## 2024-08-21 MED ORDER — HYDROXYZINE HCL 25 MG PO TABS
25.0000 mg | ORAL_TABLET | Freq: Three times a day (TID) | ORAL | Status: DC | PRN
  Administered 2024-08-23 – 2024-08-24 (×3): 25 mg via ORAL
  Filled 2024-08-21: qty 10
  Filled 2024-08-21 (×3): qty 1

## 2024-08-21 MED ORDER — HALOPERIDOL LACTATE 5 MG/ML IJ SOLN: 5.0000 mg | Freq: Three times a day (TID) | INTRAMUSCULAR | Status: DC | PRN

## 2024-08-21 MED ORDER — INSULIN ASPART 100 UNIT/ML IJ SOLN
0.0000 [IU] | Freq: Three times a day (TID) | INTRAMUSCULAR | Status: DC
  Administered 2024-08-21: 6 [IU] via SUBCUTANEOUS
  Administered 2024-08-21 (×2): 5 [IU] via SUBCUTANEOUS
  Administered 2024-08-22: 4 [IU] via SUBCUTANEOUS

## 2024-08-21 MED ORDER — TRAZODONE HCL 50 MG PO TABS
50.0000 mg | ORAL_TABLET | Freq: Every evening | ORAL | Status: DC | PRN
  Administered 2024-08-21 – 2024-08-24 (×4): 50 mg via ORAL
  Filled 2024-08-21 (×4): qty 1
  Filled 2024-08-21: qty 7

## 2024-08-21 MED ORDER — HALOPERIDOL LACTATE 5 MG/ML IJ SOLN: 10.0000 mg | Freq: Three times a day (TID) | INTRAMUSCULAR | Status: DC | PRN

## 2024-08-21 MED ORDER — LISINOPRIL 5 MG PO TABS
2.5000 mg | ORAL_TABLET | Freq: Every day | ORAL | Status: DC
  Administered 2024-08-22: 2.5 mg via ORAL
  Filled 2024-08-21 (×3): qty 1

## 2024-08-21 MED ORDER — HALOPERIDOL 5 MG PO TABS: 5.0000 mg | ORAL_TABLET | Freq: Three times a day (TID) | ORAL | Status: DC | PRN

## 2024-08-21 MED ORDER — INSULIN GLARGINE 100 UNIT/ML ~~LOC~~ SOLN
20.0000 [IU] | Freq: Every day | SUBCUTANEOUS | Status: DC
  Administered 2024-08-21: 20 [IU] via SUBCUTANEOUS
  Filled 2024-08-21 (×2): qty 0.2

## 2024-08-21 MED ORDER — NICOTINE POLACRILEX 2 MG MT GUM: 2.0000 mg | CHEWING_GUM | OROMUCOSAL | Status: DC | PRN

## 2024-08-21 MED ORDER — ALUM & MAG HYDROXIDE-SIMETH 200-200-20 MG/5ML PO SUSP: 30.0000 mL | ORAL | Status: DC | PRN

## 2024-08-21 MED ORDER — HYDRALAZINE HCL 25 MG PO TABS
25.0000 mg | ORAL_TABLET | Freq: Three times a day (TID) | ORAL | Status: DC
  Administered 2024-08-21 – 2024-08-25 (×13): 25 mg via ORAL
  Filled 2024-08-21 (×11): qty 1
  Filled 2024-08-21: qty 21
  Filled 2024-08-21 (×2): qty 1

## 2024-08-21 NOTE — BHH Counselor (Signed)
 Adult Comprehensive Assessment  Patient ID: [name] [name], male   DOB: 01-13-1986, 39 y.o.   MRN: [id number]  Information Source: Information source: Patient  Current Stressors:  Patient states their primary concerns and needs for treatment are:: Patient states My diabetes at first then I was having suicidat thoughts. Educational / Learning stressors: None reported Employment / Job issues: None reported Family Relationships: None reported. Financial / Lack of resources (include bankruptcy): None reported. Housing / Lack of housing: None reported. Physical health (include injuries & life threatening diseases): None reported. Social relationships: None reported. Substance abuse: None reported. Bereavement / Loss: None reported.  Living/Environment/Situation:  Living Arrangements: Non-relatives/Friends Living conditions (as described by patient or guardian): Patient was staying with a friend. Who else lives in the home?: Friend. How long has patient lived in current situation?: One month. What is atmosphere in current home: Temporary  Family History:  Marital status: Divorced Divorced, when?: Nothing reported. What types of issues is patient dealing with in the relationship?: None reported. Additional relationship information: None reported. Are you sexually active?: Yes What is your sexual orientation?: Straight Has your sexual activity been affected by drugs, alcohol, medication, or emotional stress?: No Does patient have children?: Yes How many children?: 6 How is patient's relationship with their children?: I'm in two of their lives.  Childhood History:  By whom was/is the patient raised?: Mother Additional childhood history information: Nothing reported. Description of patient's relationship with caregiver when they were a child: Good Patient's description of current relationship with people who raised him/her: Fair How were you disciplined when you got in  trouble as a child/adolescent?: She raised me good, spanking. Does patient have siblings?: Yes Number of Siblings: 2 Description of patient's current relationship with siblings: Good Did patient suffer any verbal/emotional/physical/sexual abuse as a child?: No Did patient suffer from severe childhood neglect?: No Has patient ever been sexually abused/assaulted/raped as an adolescent or adult?: No Was the patient ever a victim of a crime or a disaster?: No Witnessed domestic violence?: No Has patient been affected by domestic violence as an adult?: No  Education:  Highest grade of school patient has completed: 11th grade. Currently a student?: No Learning disability?: No  Employment/Work Situation:   Employment Situation: Unemployed Patient's Job has Been Impacted by Current Illness: No What is the Longest Time Patient has Held a Job?: Three years Where was the Patient Employed at that Time?: [employer] in [place]  Has Patient ever Been in the [employer]?: No  Financial Resources:   Surveyor, Quantity resources:  (Odd jobs here and there.) Does patient have a lawyer or guardian?: No  Alcohol/Substance Abuse:   What has been your use of drugs/alcohol within the last 12 months?: I havent done anything in awhile Patient declined to answer. If attempted suicide, did drugs/alcohol play a role in this?: No Alcohol/Substance Abuse Treatment Hx: Past Tx, Outpatient If yes, describe treatment and response: [place]  - for alchohol use. Is patient motivated for change?: Yes Does patient live in an environment that promotes recovery or serves as an obstacle to recovery?: No Are others in the home using alcohol or other substances?: No Are significant others in the home willing to participate in the patient's care?: Yes Describe significant others willing to participate in the patient's care: Friend. Has alcohol/substance abuse ever caused legal problems?: Yes  Social  Support System:   Patient's Community Support System: Fair Describe Community Support System: Sometimes the church Type of faith/religion: Baptist How does patient's faith help  to cope with current illness?: Christianity helps emotional and spiritually.  Leisure/Recreation:   Do You Have Hobbies?: Yes Leisure and Hobbies: Music  Strengths/Needs:   What is the patient's perception of their strengths?: Muisic, and playing the guitar. Patient states they can use these personal strengths during their treatment to contribute to their recovery: Whenever I feel stressed I play the guitar and it helps me alot. Patient states these barriers may affect/interfere with their treatment: None reported. Patient states these barriers may affect their return to the community: None reported. Other important information patient would like considered in planning for their treatment: Patient states he would like assistance returning home to [place], [place].  Discharge Plan:   Currently receiving community mental health services: No Patient states concerns and preferences for aftercare planning are: In-person. Patient states they will know when they are safe and ready for discharge when: My mind is stable and doesn't race everywhere. Does patient have access to transportation?: No Does patient have financial barriers related to discharge medications?: No Plan for no access to transportation at discharge: Patient uses city bus for transportation. Will patient be returning to same living situation after discharge?: Yes (It's not bad at friends home.)  Summary/Recommendations:    Patient is a 39 year old male admitted for suicidal ideation with plan to overdose on pain pills or not take his insulin  relating to homelessness  Per chart review:Patient has history of poorly controlled diabetes, polysubstance abuse, hypertension, hyperlipidemia, presenting to the ED with hyperglycemia. He has reportedly  not taken his insulin  in the past 2 days.  Patient reports moving to [place]  two years ago and has been staying with a friend for one month stating desire to return to [place] where his family is. Patient reports going to the emergency room first for my diabetes, then I was having suicidal thoughts. Patient denies current SI. Patient states past in-patient treatment for alcohol abuse while residing in [place] and denies current use reporting  I haven't done anything in awhile. Patient reports a fair support system utilizing music and playing the guitar stating they help alot.  Patient states when my mind is stable and doesn't race everywhere, he will be ready for discharge. Patient denies mental health provider stating preference for in-person appointments.  Patient will require transportation assistance upon discharge.  Patient will benefit from crisis stabilization, medication evaluation, group therapy and psychoeducation, in addition to case management for discharge planning. At discharge it is recommended that Patient adhere to the established discharge plan and continue in treatment.  [name] [name]. 08/21/2024

## 2024-08-21 NOTE — Progress Notes (Signed)
 Pt has been accepted to [facility] on 08/21/2024 . Bed assignment: 300-1   Pt meets inpatient criteria per Richerd Ivans, NP   Attending Physician will be Dr. Prentis   Report can be called to: Adult unit: [phone]  Pt can arrive: [facility] will update   Care Team Notified: [facility] Tosin Olasunkanmi, NP, Teneka Striblin, LCSW

## 2024-08-21 NOTE — Tx Team (Signed)
 Initial Treatment Plan 08/21/2024 6:03 AM AZAREL BANNER FMW:[id number]    PATIENT STRESSORS: Financial difficulties   Health problems   Medication change or noncompliance   Occupational concerns   Other: Family lives in [place]     PATIENT STRENGTHS: Ability for insight  Communication skills    PATIENT IDENTIFIED PROBLEMS: Homelessness  Away from family                    DISCHARGE CRITERIA:  Ability to meet basic life and health needs Adequate post-discharge living arrangements Improved stabilization in mood, thinking, and/or behavior  PRELIMINARY DISCHARGE PLAN: Placement in alternative living arrangements  PATIENT/FAMILY INVOLVEMENT: This treatment plan has been presented to and reviewed with the patient, [name] DELENA Metro.[name] Land, RN 08/21/2024, 6:03 AM

## 2024-08-21 NOTE — Progress Notes (Signed)
 Pt BS 352. !3 units of Novolog  given. (8 scheduled, 5 s/s).

## 2024-08-21 NOTE — Group Note (Signed)
 Date:  08/21/2024 Time:  4:26 PM  Group Topic/Focus:  Rediscovering Joy:   The focus of this group is to explore various ways to relieve stress in a positive manner.     Participation Level:  Did Not Attend   Inocente PARAS [facility] 08/21/2024, 4:26 PM

## 2024-08-21 NOTE — Progress Notes (Signed)
 Pt ate 100 % of breakfast. BS-523. 8 units scheduled Novolog  given. 6 units Novolog  s/s given.

## 2024-08-21 NOTE — Progress Notes (Signed)
 RN patient is a  VOL 39 year old male.Pt was calm and cooperative during assessment. Denies SI/HI/AVH.Pt was vague of why he is here and stated his depression was due to lack of support system and homelessness. Pt stated he feel his depression would not be as prevalent if he was in [place] with family. Admission plan of care reviewed with pt, consent signed.  Personal belongings/skin assessment completed.   No contraband found. Pt has dry flaky skin and a open wound  to right upper arms. The wound flesh brf appears to have purulent drainage and is painful to touch. Pt educated on the importance of DM2 management.  Patient oriented to the unit, staff and room.  Routine safety checks initiated.  Verbalizes understanding of unit rules/protocols.   Patient is presently safe on the unit. No unsafe behaviors noted.  Q 15 minute safety checks maintained per unit protocol.

## 2024-08-21 NOTE — ED Provider Notes (Signed)
 " [place] EMERGENCY DEPARTMENT AT [facility] Provider Note   CSN: [id number] Arrival date & time: 08/21/24  2250     Patient presents with: No chief complaint on file.   [name] [name] is a 39 y.o. male.   39 year old male presents from behavioral health Hospital with concern for elevated glucose reading.  Patient was provided with 20 units of Lantus  prior to arrival.  His blood sugar has improved.  He is feeling improved.  Plan is to dispo back to behavioral health Hospital.       Prior to Admission medications  Medication Sig Start Date End Date Taking? Authorizing Provider  amLODipine  (NORVASC ) 10 MG tablet Take 1 tablet (10 mg total) by mouth daily. Patient not taking: Reported on 08/20/2024 04/06/24 06/09/24  Leotis Bogus, MD  amLODipine  (NORVASC ) 5 MG tablet Take 5 mg by mouth daily. Patient not taking: Reported on 08/20/2024 05/28/24   [provider]  hydrALAZINE  (APRESOLINE ) 25 MG tablet Take 1 tablet (25 mg total) by mouth every 8 (eight) hours. Patient not taking: Reported on 05/24/2024 04/05/24 05/24/24  Leotis Bogus, MD  Insulin  Aspart FlexPen (NOVOLOG ) 100 UNIT/ML SMARTSIG:8 Unit(s) SUB-Q 3 Times Daily Patient not taking: Reported on 08/20/2024 06/21/24   [provider]  insulin  glargine (LANTUS  SOLOSTAR) 100 UNIT/ML Solostar Pen Inject 20 Units into the skin daily. Patient not taking: Reported on 08/20/2024 08/05/24   Raford Lenis, MD  insulin  lispro (HUMALOG  [facility]) 100 UNIT/ML KwikPen Inject 8 Units into the skin 3 (three) times daily. Patient taking differently: Inject 15 Units into the skin 3 (three) times daily. 08/05/24   Raford Lenis, MD  Lancet Device MISC Use 3 (three) times daily. 05/05/24   Vernon Ranks, MD    Allergies: Patient has no known allergies.    Review of Systems Negative except as per HPI Updated Vital Signs BP 120/85 (BP Location: Left Arm)   Pulse 90   Temp 98.6 F (37 C) (Oral)   Resp 15   Ht 5' 11  (1.803 m)   Wt 95.7 kg   SpO2 97%   BMI 29.43 kg/m   Physical Exam Vitals and nursing note reviewed.  Constitutional:      General: He is not in acute distress.    Appearance: He is well-developed. He is not diaphoretic.  HENT:     Head: Normocephalic and atraumatic.  Pulmonary:     Effort: Pulmonary effort is normal.  Neurological:     Mental Status: He is alert and oriented to person, place, and time.  Psychiatric:        Behavior: Behavior normal.     (all labs ordered are listed, but only abnormal results are displayed) Labs Reviewed  GLUCOSE, CAPILLARY - Abnormal; Notable for the following components:      Result Value   Glucose-Capillary 522 (*)    All other components within normal limits  GLUCOSE, CAPILLARY - Abnormal; Notable for the following components:   Glucose-Capillary 372 (*)    All other components within normal limits  GLUCOSE, CAPILLARY - Abnormal; Notable for the following components:   Glucose-Capillary 362 (*)    All other components within normal limits  GLUCOSE, CAPILLARY - Abnormal; Notable for the following components:   Glucose-Capillary 501 (*)    All other components within normal limits  I-STAT CHEM 8, ED - Abnormal; Notable for the following components:   Sodium 133 (*)    Chloride 97 (*)    Glucose, Bld 385 (*)  All other components within normal limits  CBG MONITORING, ED - Abnormal; Notable for the following components:   Glucose-Capillary 371 (*)    All other components within normal limits  AEROBIC/ANAEROBIC CULTURE W GRAM STAIN (SURGICAL/DEEP WOUND)    EKG: None  Radiology: No results found.   Procedures   Medications Ordered in the ED  acetaminophen  (TYLENOL ) tablet 650 mg (has no administration in time range)  alum & mag hydroxide-simeth (MAALOX/MYLANTA) 200-200-20 MG/5ML suspension 30 mL (has no administration in time range)  magnesium  hydroxide (MILK OF MAGNESIA) suspension 30 mL (has no administration in time  range)  haloperidol  (HALDOL ) tablet 5 mg (has no administration in time range)    And  diphenhydrAMINE  (BENADRYL ) capsule 50 mg (has no administration in time range)  haloperidol  lactate (HALDOL ) injection 5 mg (has no administration in time range)    And  diphenhydrAMINE  (BENADRYL ) injection 50 mg (has no administration in time range)    And  LORazepam  (ATIVAN ) injection 2 mg (has no administration in time range)  haloperidol  lactate (HALDOL ) injection 10 mg (has no administration in time range)    And  diphenhydrAMINE  (BENADRYL ) injection 50 mg (has no administration in time range)    And  LORazepam  (ATIVAN ) injection 2 mg (has no administration in time range)  hydrOXYzine  (ATARAX ) tablet 25 mg (has no administration in time range)  traZODone  (DESYREL ) tablet 50 mg (50 mg Oral Given 08/21/24 2124)  amLODipine  (NORVASC ) tablet 10 mg (10 mg Oral Given 08/21/24 0840)  hydrALAZINE  (APRESOLINE ) tablet 25 mg (25 mg Oral Given 08/21/24 2124)  insulin  aspart (novoLOG ) injection 0-6 Units (5 Units Subcutaneous Given 08/21/24 1822)  insulin  aspart (novoLOG ) injection 8 Units (8 Units Subcutaneous Given 08/21/24 1823)  nicotine  polacrilex (NICORETTE ) gum 2 mg (has no administration in time range)  cephALEXin  (KEFLEX ) capsule 500 mg (500 mg Oral Given 08/21/24 2124)  insulin  glargine (LANTUS ) injection 20 Units (20 Units Subcutaneous Given 08/21/24 2149)  lisinopril  (ZESTRIL ) tablet 2.5 mg (has no administration in time range)                                    Medical Decision Making  39 year old male with history of diabetes and elevated glucose.  Appropriately managed at behavioral health Hospital with his insulin  dose.  Arrives without complaint.  His glucose is currently 385 on i-STAT CHEM which does not suggest DKA.  Patient feeling better.  Patient is dispo back to behavioral health Hospital.     Final diagnoses:  Elevated glucose    ED Discharge Orders     None          Beverley Leita LABOR, PA-C 08/21/24 2345  "

## 2024-08-21 NOTE — Plan of Care (Signed)
 ?  Problem: Education: ?Goal: Mental status will improve ?Outcome: Progressing ?Goal: Verbalization of understanding the information provided will improve ?Outcome: Progressing ?  ?

## 2024-08-21 NOTE — Progress Notes (Signed)
" °   08/21/24 1200  Psych Admission Type (Psych Patients Only)  Admission Status Voluntary  Psychosocial Assessment  Patient Complaints Depression  Eye Contact Fair  Facial Expression Flat  Affect Flat  Speech Soft  Interaction Assertive  Motor Activity Fidgety  Appearance/Hygiene Disheveled  Behavior Characteristics Cooperative;Calm  Mood Depressed  Thought Process  Coherency WDL  Content WDL  Delusions None reported or observed  Perception WDL  Hallucination None reported or observed  Judgment Poor  Confusion None  Danger to Self  Current suicidal ideation? Denies  Danger to Others  Danger to Others None reported or observed    "

## 2024-08-21 NOTE — Group Note (Signed)
 Date:  08/21/2024 Time:  9:15 AM  Group Topic/Focus: Goals Group  Patients participated in an research scientist (life sciences) by sharing their favorite foods and their feelings about the weekend snow. Patients then completed worksheets focused on developing SMART goals and were encouraged to share recovery-related goals with the group. The group promoted positive social interaction, engagement, and provided a supportive environment for safe self-expression and vulnerability.   Participation Level:  Did Not Attend  Additional Comments:  Pt did not attend group  Kristi HERO [facility] 08/21/2024, 9:15 AM

## 2024-08-21 NOTE — Consult Note (Signed)
 WOC Nurse Consult Note: Reason for Consult:  Picture in chart. No culture materials on the unit. Having difficulty obtaining these. Would like to culture wound prior to starting Abx  Wound type: full thickness wound right arm Pressure Injury POA: NA Measurement:see nursing flow sheet Wound azi:epwx; minimal yellow  Drainage (amount, consistency, odor) see nursing flow sheets Periwound:intact  Dressing procedure/placement/frequency: Cleanse wound with saline, pat dry Pack area with 1/4 iodoform gauze Soila [phone]) top with small foam Soila # [id number] or [phone]). Ordered from materials. Change daily    Re consult if needed, will not follow at this time. Thanks  Avannah Decker [employer], RN,CWOCN, CNS, [employer] [phone]

## 2024-08-21 NOTE — Progress Notes (Signed)
 Pt is on his way to [facility] ED due to high blood sugar 501 mg/dl as per the recommendation of the on call provider. Pt given the scheduled 20 unit of Lantus  before sending him to the ED. Pt complained of feeling tired and body aches. He has been calm and cooperative, alert and oriented, denied SI and verbally contracted for safety, will continue to monitor.

## 2024-08-21 NOTE — Group Note (Signed)
 Date:  08/21/2024 Time:  9:02 PM  Group Topic/Focus:  Wrap-Up Group:   The focus of this group is to help patients review their daily goal of treatment and discuss progress on daily workbooks.    Participation Level:  Did Not Attend  Participation Quality:  none  Affect:  n/a  Cognitive:  n/a  Insight: None  Engagement in Group:  none  Modes of Intervention:  none  Additional Comments:   Pt did not attend wrap up group  Eren Puebla A Araeya Lamb 08/21/2024, 9:02 PM

## 2024-08-21 NOTE — Group Note (Signed)
 Date:  08/21/2024 Time:  9:58 AM  Group Topic/Focus: Social Wellness  Patients participated in a charades activity designed to promote creativity, critical thinking, and peer interaction. The activity encouraged communication and engagement among group members. During the final 10 minutes of group, patients socialized with one another, further supporting socialization and interpersonal skills.    Participation Level:  Did Not Attend  Additional Comments:  Pt did not attend group  Kristi HERO [facility] 08/21/2024, 9:58 AM

## 2024-08-21 NOTE — Group Note (Signed)
 Date:  08/21/2024 Time:  12:53 PM  Group Topic/Focus: Physical Wellness Group  Patients viewed two TED Talks focused on physical exercise and its benefits for mental health. Prior to viewing, patients engaged in discussion about their perceptions of physical wellness and mental health and related these concepts to their own experiences. Group members explored and weighed the positive and negative effects of physical activity as a coping mechanism, demonstrating insight and self-reflection.    Participation Level:  Did Not Attend  Additional Comments:  Pt did not attend group  Kristi HERO [facility] 08/21/2024, 12:53 PM

## 2024-08-21 NOTE — H&P (Signed)
 " Psychiatric Admission Assessment Adult  Patient Identification: [name] [name] MRN:  [id number] Date of Evaluation:  08/21/2024 Chief Complaint:  Suicidal Ideation Principal Diagnosis: Adjustment disorder with depressed mood Diagnosis:  Principal Problem:   Adjustment disorder with depressed mood Active Problems:   Suicidal ideation  History of Present Illness: [name] [name] is a 39 y/o male with DM1 and significant prior psychiatric history who presented to the emergency department with active suicidal ideation in the context of chronic homelessness and lack of social support. He reports that he was previously living in [place], [place] with his wife and six children until about two years ago when he left home to stay with his grandmother in [place], [place]. He is vague on the exact details of what circumstances led to this, but hints at marital stressors related to a history of substance abuse. He reports that he was initially living with his grandmother until his mother (who was living in [place]) came down to stay with the two of them, and this ultimately led to him being forced out of the home. He describes a strained relationship with his mother, and states that his grandmother is in poor health and has symptoms of dementia. He reports that for approximately the past year he has been homeless in [place], and has been staying at local shelters and with friends. He has experienced numerous ED and hospital visits over the past year for complications related to his poorly controlled diabetes, and states that he has not seen any providers locally for outpatient management of this. He reports that while he has received prescriptions for insulin  intermittently there has been no substantive follow-up or management. He most recently presented to the ED on 1/20 for hyperglycemia and flank pain and was stabilized and discharged. Per chart review, he appeared to have left and returned to the ED the following day, with  documented encounters on 1/21 and 1/22. On that encounter he was documented to endorse suicidal ideation but was cleared for discharge, however, he returned the following day reporting ongoing suicidal ideation with thoughts of overdosing on pain pills or not taking his insulin  and was ultimately admitted voluntarily to inpatient psychiatry.   In reviewing current symptoms he endorses a depressed mood without anhedonia, hopelessness, significant guilt, appetite disturbance, or concentration difficulties. He endorses anxiety related to his social situation but otherwise does not endorse symptoms of GAD or panic disorder. At the time of assessment he denied active SI. He shared that he had recently been in contact with his wife and is hopeful to return to [place] to reunite with his family.    Past Psychiatric History: The patient denies any significant psychiatric history, to include suicide attempts, prescribed medications, or outpatient treatment.    Alcohol Screening: 1. How often do you have a drink containing alcohol?: 2 to 4 times a month 2. How many drinks containing alcohol do you have on a typical day when you are drinking?: 1 or 2 3. How often do you have six or more drinks on one occasion?: Never AUDIT-C Score: 2 4. How often during the last year have you found that you were not able to stop drinking once you had started?: Never 5. How often during the last year have you failed to do what was normally expected from you because of drinking?: Never 6. How often during the last year have you needed a first drink in the morning to get yourself going after a heavy drinking session?: Never 7. How often  during the last year have you had a feeling of guilt of remorse after drinking?: Never 8. How often during the last year have you been unable to remember what happened the night before because you had been drinking?: Never 9. Have you or someone else been injured as a result of your drinking?: No 10.  Has a relative or friend or a doctor or another health worker been concerned about your drinking or suggested you cut down?: No Alcohol Use Disorder Identification Test Final Score (AUDIT): 2  Past Medical History:  Past Medical History:  Diagnosis Date   Diabetes mellitus without complication (HCC)    DKA (diabetic ketoacidosis) (HCC) 05/08/2022   HTN (hypertension) 05/08/2022   Hyperlipidemia    Hyperosmolar hyperglycemic state (HHS) (HCC) 05/08/2022   Hypertension    Hypoglycemia 07/02/2019   Pseudohyponatremia 05/08/2022    Past Surgical History:  Procedure Laterality Date   HAND SURGERY     Family History:  Family History  Family history unknown: Yes   Family Psychiatric  History: Denies any known history  Tobacco Screening: Tobacco Use History[1]  BH Tobacco Counseling     Are you interested in Tobacco Cessation Medications?  No value filed. Counseled patient on smoking cessation:  No value filed. Reason Tobacco Screening Not Completed: No value filed.       Social History:  Married, but geographically separated from wife, who lives in [place], [place]. He reports having six children, ages 2-21. 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 He denies any current legal issues. He does not have any current form  of income. He is currently homeless, but has been staying in local shelters and with friends who live locally.  Substance use Smoke about 1/2 ppd. Denies regular marijuana use. 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 He reports drinking alcohol about 3 times per week, and up to 1/2 gallon of liquor at a time. He denies any hx of DUI or alcohol withdrawal. No hx of substance use treatment.   Allergies:  Allergies[2] Lab Results:  Results for orders placed or performed during the hospital encounter of 08/21/24 (from the past 48 hours)  Glucose, capillary      Status: Abnormal   Collection Time: 08/21/24  7:27 AM  Result Value Ref Range   Glucose-Capillary 522 (HH) 70 - 99 mg/dL    Comment: Glucose reference range applies only to samples taken after fasting for at least 8 hours.   Comment 1 Notify RN   Glucose, capillary     Status: Abnormal   Collection Time: 08/21/24 11:48 AM  Result Value Ref Range   Glucose-Capillary 372 (H) 70 - 99 mg/dL    Comment: Glucose reference range applies only to samples taken after fasting for at least 8 hours.    Blood Alcohol level:  Lab Results  Component Value Date   [facility] <15 08/20/2024   ETH <15 08/18/2024    Metabolic Disorder Labs:  Lab Results  Component Value Date   HGBA1C >15.5 (H) 02/28/2024   MPG >398 02/28/2024   MPG 323.53 11/29/2023   No results found for: PROLACTIN No results found for: CHOL, TRIG, HDL, CHOLHDL, VLDL, LDLCALC  Current Medications: Current Facility-Administered Medications  Medication Dose Route Frequency Provider Last Rate Last Admin   acetaminophen  (TYLENOL ) tablet 650 mg  650 mg Oral Q6H PRN Bobbitt, Shalon E, NP  alum & mag hydroxide-simeth (MAALOX/MYLANTA) 200-200-20 MG/5ML suspension 30 mL  30 mL Oral Q4H PRN Bobbitt, Shalon E, NP       amLODipine  (NORVASC ) tablet 10 mg  10 mg Oral Daily Bobbitt, Shalon E, NP   10 mg at 08/21/24 0840   cephALEXin  (KEFLEX ) capsule 500 mg  500 mg Oral Q12H Onyinyechi Huante A, DO       haloperidol  (HALDOL ) tablet 5 mg  5 mg Oral TID PRN Bobbitt, Shalon E, NP       And   diphenhydrAMINE  (BENADRYL ) capsule 50 mg  50 mg Oral TID PRN Bobbitt, Shalon E, NP       haloperidol  lactate (HALDOL ) injection 5 mg  5 mg Intramuscular TID PRN Bobbitt, Shalon E, NP       And   diphenhydrAMINE  (BENADRYL ) injection 50 mg  50 mg Intramuscular TID PRN Bobbitt, Shalon E, NP       And   LORazepam  (ATIVAN ) injection 2 mg  2 mg Intramuscular TID PRN Bobbitt, Shalon E, NP       haloperidol  lactate (HALDOL ) injection 10 mg  10 mg  Intramuscular TID PRN Bobbitt, Shalon E, NP       And   diphenhydrAMINE  (BENADRYL ) injection 50 mg  50 mg Intramuscular TID PRN Bobbitt, Shalon E, NP       And   LORazepam  (ATIVAN ) injection 2 mg  2 mg Intramuscular TID PRN Bobbitt, Shalon E, NP       hydrALAZINE  (APRESOLINE ) tablet 25 mg  25 mg Oral Q8H Bobbitt, Shalon E, NP   25 mg at 08/21/24 0631   hydrOXYzine  (ATARAX ) tablet 25 mg  25 mg Oral TID PRN Bobbitt, Shalon E, NP       insulin  aspart (novoLOG ) injection 0-6 Units  0-6 Units Subcutaneous TID WC Bobbitt, Shalon E, NP   5 Units at 08/21/24 1246   insulin  aspart (novoLOG ) injection 8 Units  8 Units Subcutaneous TID with meals Bobbitt, Shalon E, NP   8 Units at 08/21/24 1246   insulin  glargine (LANTUS ) injection 20 Units  20 Units Subcutaneous QHS Caila Cirelli A, DO       magnesium  hydroxide (MILK OF MAGNESIA) suspension 30 mL  30 mL Oral Daily PRN Bobbitt, Shalon E, NP       nicotine  polacrilex (NICORETTE ) gum 2 mg  2 mg Oral PRN Bobbie Valletta A, DO       traZODone  (DESYREL ) tablet 50 mg  50 mg Oral QHS PRN Bobbitt, Shalon E, NP       PTA Medications: Medications Prior to Admission  Medication Sig Dispense Refill Last Dose/Taking   amLODipine  (NORVASC ) 10 MG tablet Take 1 tablet (10 mg total) by mouth daily. (Patient not taking: Reported on 08/20/2024) 30 tablet 1    amLODipine  (NORVASC ) 5 MG tablet Take 5 mg by mouth daily. (Patient not taking: Reported on 08/20/2024)      hydrALAZINE  (APRESOLINE ) 25 MG tablet Take 1 tablet (25 mg total) by mouth every 8 (eight) hours. (Patient not taking: Reported on 05/24/2024) 90 tablet 0    Insulin  Aspart FlexPen (NOVOLOG ) 100 UNIT/ML SMARTSIG:8 Unit(s) SUB-Q 3 Times Daily (Patient not taking: Reported on 08/20/2024)      insulin  glargine (LANTUS  SOLOSTAR) 100 UNIT/ML Solostar Pen Inject 20 Units into the skin daily. (Patient not taking: Reported on 08/20/2024) 15 mL 11    insulin  lispro (HUMALOG  KWIKPEN) 100 UNIT/ML KwikPen Inject 8 Units into  the skin 3 (three) times daily. (Patient taking differently:  Inject 15 Units into the skin 3 (three) times daily.) 15 mL 11    Lancet Device MISC Use 3 (three) times daily. 1 each 0    Mental Status Exam: Appearance - Casually dressed, appropriate hygiene and grooming  Eye-Contact - Normal Attitude - Calm, polite, not guarded Speech - normal volume, prosody, inflection Mood - Better Affect - Restricted Thought Process - LLGD Thought Content - No delusional TC expressed SI/HI - Denies  Perceptions - Denies AVH; not RIS Judgement/Insight - Fair Fund of knowledge - WNL Language - No impairments   Physical Exam Vitals reviewed.  Constitutional:      Appearance: Normal appearance. He is normal weight.  HENT:     Head: Normocephalic and atraumatic.  Skin:    Comments: Patient has an approximately 2x 2 area of hardened skin on his right lateral upper arm with a small circular hole estimated to be about 1-2 cm in depth. A small amount of purulent drained can be expressed.   Neurological:     General: No focal deficit present.     Mental Status: He is alert and oriented to person, place, and time.    Review of Systems  Constitutional: Negative.   Respiratory: Negative.    Cardiovascular: Negative.    Blood pressure (!) 145/105, pulse (!) 108, temperature 98 F (36.7 C), temperature source Oral, resp. rate 18, height 5' 11 (1.803 m), weight 95.7 kg, SpO2 100%. Body mass index is 29.43 kg/m.  Assessment and Plan: [name] [name] is a 39 y/o male with poorly controlled DM1 and no significant prior psychiatric history who was admitted voluntarily to inpatient psychiatry after presented to the ED with suicidal ideation with thoughts of not taking his insulin  or overdosing on pills.   While he endorses a depressed mood he does not meet criteria for a major depressive episode and describes his current symptoms as a direct result of his lack of housing, social support, and chronic  medical problems. He is not interested in pharmacotherapy at this time and expresses interest in exploring opportunities to obtain transportation back to [place], [place].  Admitting Diagnosis:  Adjustment Disorder with depressed mood Soft tissue infection  Observation Level/Precautions:  15 minute checks  Laboratory:  Na 134; Ca 8.8; glucose 261; UDS positive for amphetamines  Psychotherapy:  group  Medications:  Resume insulin  regimen and antihypertensives. Cephalexin  and wound culture  Consultations:  SW  Discharge Concerns:  Location  Estimated LOS: 3-5 days  Risk Assessment: High   Physician Treatment Plan for Primary Diagnosis: Adjustment disorder with depressed mood Long Term Goal(s): Determine long-term plan to mitigate homelessness, such as securing transportation to [place]  Short Term Goals: Resolution of SI; resume insulin  regimen and normalize BS  I certify that inpatient services furnished can reasonably be expected to improve the patient's condition.    Oliva DELENA Salmon, DO 1/24/20264:27 PM     [1]  Social History Tobacco Use  Smoking Status Every Day   Types: Cigarettes  Smokeless Tobacco Never  [2] No Known Allergies  "

## 2024-08-21 NOTE — Group Note (Signed)
 Date:  08/21/2024 Time:  2:02 PM  Group Topic/Focus: Karaoke Group  Patients participated in singing songs individually and as a group. The activity encouraged creativity, peer support, and confidence building, while utilizing music as a positive coping mechanism and means of self-expression    Participation Level:  Did Not Attend  Additional Comments:  Pt did not attend this group  Kristi HERO [facility] 08/21/2024, 2:02 PM

## 2024-08-21 NOTE — Plan of Care (Signed)
   Problem: Education: Goal: Emotional status will improve Outcome: Not Progressing Goal: Mental status will improve Outcome: Not Progressing Goal: Verbalization of understanding the information provided will improve Outcome: Not Progressing

## 2024-08-21 NOTE — Group Note (Signed)
 LCSW Group Therapy Note  Group Date: 08/21/2024 Start Time: 1000 End Time: 1100   Type of Therapy and Topic:  Group Therapy - Healthy vs Unhealthy Coping Skills  Participation Level:  Did Not Attend   Description of Group The focus of this group was to determine what unhealthy coping techniques typically are used by group members and what healthy coping techniques would be helpful in coping with various problems. Patients were guided in becoming aware of the differences between healthy and unhealthy coping techniques. Patients were asked to identify 2-3 healthy coping skills they would like to learn to use more effectively.  Therapeutic Goals Patients learned that coping is what human beings do all day long to deal with various situations in their lives Patients defined and discussed healthy vs unhealthy coping techniques Patients identified their preferred coping techniques and identified whether these were healthy or unhealthy Patients determined 2-3 healthy coping skills they would like to become more familiar with and use more often. Patients provided support and ideas to each other   Summary of Patient Progress:  NA   Therapeutic Modalities Cognitive Behavioral Therapy Motivational Interviewing  Evalene MALVA Quale, LCSWA 08/21/2024  12:21 PM

## 2024-08-21 NOTE — ED Notes (Signed)
 Report called to [facility] RN, Safe transport called. VS updated. But while waiting for safe transport N.P. cancelled the transfer and stating they're not ready yet. Charge RN informed.

## 2024-08-21 NOTE — BHH Group Notes (Signed)
 Pt attended about half of the CSW group

## 2024-08-22 LAB — GLUCOSE, CAPILLARY
Glucose-Capillary: 341 mg/dL — ABNORMAL HIGH (ref 70–99)
Glucose-Capillary: 486 mg/dL — ABNORMAL HIGH (ref 70–99)
Glucose-Capillary: 291 mg/dL — ABNORMAL HIGH (ref 70–99)
Glucose-Capillary: 484 mg/dL — ABNORMAL HIGH (ref 70–99)
Glucose-Capillary: 550 mg/dL (ref 70–99)

## 2024-08-22 MED ORDER — INSULIN ASPART 100 UNIT/ML IJ SOLN: 0.0000 [IU] | Freq: Three times a day (TID) | INTRAMUSCULAR | Status: DC

## 2024-08-22 MED ORDER — INSULIN ASPART 100 UNIT/ML IJ SOLN
20.0000 [IU] | Freq: Once | INTRAMUSCULAR | Status: AC
  Administered 2024-08-22: 20 [IU] via SUBCUTANEOUS

## 2024-08-22 MED ORDER — LISINOPRIL 5 MG PO TABS
5.0000 mg | ORAL_TABLET | Freq: Every day | ORAL | Status: DC
  Administered 2024-08-23 – 2024-08-25 (×3): 5 mg via ORAL
  Filled 2024-08-22: qty 1
  Filled 2024-08-22: qty 7
  Filled 2024-08-22 (×2): qty 1

## 2024-08-22 MED ORDER — INSULIN ASPART 100 UNIT/ML IJ SOLN
0.0000 [IU] | Freq: Three times a day (TID) | INTRAMUSCULAR | Status: DC
  Administered 2024-08-22: 8 [IU] via SUBCUTANEOUS
  Administered 2024-08-22: 15 [IU] via SUBCUTANEOUS
  Administered 2024-08-23: 3 [IU] via SUBCUTANEOUS
  Administered 2024-08-23 – 2024-08-24 (×3): 15 [IU] via SUBCUTANEOUS
  Administered 2024-08-24: 11 [IU] via SUBCUTANEOUS
  Administered 2024-08-24 – 2024-08-25 (×2): 8 [IU] via SUBCUTANEOUS

## 2024-08-22 MED ORDER — INSULIN GLARGINE 100 UNIT/ML ~~LOC~~ SOLN
25.0000 [IU] | Freq: Every day | SUBCUTANEOUS | Status: DC
  Administered 2024-08-22 – 2024-08-24 (×3): 25 [IU] via SUBCUTANEOUS
  Filled 2024-08-22 (×5): qty 0.25

## 2024-08-22 NOTE — Group Note (Signed)
 Date:  08/22/2024 Time:  11:42 AM  Group Topic/Focus:  Wellness Toolbox:   The focus of this group is to discuss various aspects of wellness, balancing those aspects and exploring ways to increase the ability to experience wellness.  Patients will create a wellness toolbox for use upon discharge.    Participation Level:  Did Not Attend   [name] [name] 08/22/2024, 11:42 AM

## 2024-08-22 NOTE — Progress Notes (Signed)
 [facility] Inpatient Psychiatry Progress Note  Date: 08/22/24 Patient: [name] [name] MRN: [id number]  Assessment and Plan: [name] [name] is a 39 y/o male with poorly controlled DM1 and no significant prior psychiatric history who was admitted voluntarily to inpatient psychiatry after presented to the ED with suicidal ideation with thoughts of not taking his insulin  or overdosing on pills.    While he endorses a depressed mood he does not meet criteria for a major depressive episode and describes his current symptoms as a direct result of his lack of housing, social support, and chronic medical problems. He is not interested in pharmacotherapy at this time and expresses interest in exploring opportunities to obtain transportation back to [place], [place].  1/25 - BS remains elevated. Increase Lantus  to 25 units [place] hs. Increasing lisinopril  to 5 mg daily for persistent HTN. Continue cephalexin  for soft tissue infection. Still unable to culture due to lack of materials. Wound care consult unavailable.    # Adjustment disorder with depressed mood - Defer medication at this time  # Soft Tissue Infection - Cephalexin  500 mg q12hr for 7 days, started 1/24  # DM1 - Insulin  aspart sliding scale - regular - Insulin  aspart 8 units daily with meals - Insulin  glargine 25 units hs  # HTN - Lisinopril  5 mg daily - Amlodipine  10 mg daily - Hydralazine  25 mg q8h prn BP > 140/90  Risk Assessment - Low  Discharge Planning Estimated length of stay: 3-5 days Predicted Discharge location: Unclear, [place]?     Interval History and update: Yesterday evening patient was sent to the ED for elevated BS of 501 mg/dl for a brief period and was sent back after BS dropped below 400. Since returning he has not exhibited any significant behavioral issues. His would could not be cultured as the unit does not have culture materials and his would was not cultured while he was in the ED. On  assessment today he reported feeling better and denied SI. He denied any pain at the sight of the wound and denied any change in drainage. He expressed concern over his insulin  management and requested that it be further increased. He denied of any physical symptoms. He remains future oriented and is hopeful to receive assistance for transportation to [place] to be with his family if possible.       Physical Exam MSK/Neuro - Normal gait and station  Mental Status Exam Appearance - Casually dressed, appropriate hygiene and grooming  Attitude - Calm, polite, not guarded Speech - normal volume, prosody, inflection Mood - Alright Affect - Mildly restricted, appropriate reactive Thought Process - LLGD Thought Content - No delusional TC expressed SI/HI - Denies  Perceptions - Denies AVH; not RIS Judgement/Insight - Fair to good Fund of knowledge - WNL Language - No impairments      Lab Results:  Admission on 08/21/2024  Component Date Value Ref Range Status   Glucose-Capillary 08/21/2024 522 (HH)  70 - 99 mg/dL Final   Comment 1 98/75/7973 Notify RN   Final   Glucose-Capillary 08/21/2024 372 (H)  70 - 99 mg/dL Final   Glucose-Capillary 08/21/2024 362 (H)  70 - 99 mg/dL Final   Glucose-Capillary 08/21/2024 501 (HH)  70 - 99 mg/dL Final   Comment 1 98/75/7973 Notify RN   Final   Sodium 08/21/2024 133 (L)  135 - 145 mmol/L Final   Potassium 08/21/2024 4.3  3.5 - 5.1 mmol/L Final   Chloride 08/21/2024 97 (L)  98 - 111 mmol/L Final   BUN 08/21/2024 14  6 - 20 mg/dL Final   Creatinine, Ser 08/21/2024 1.10  0.61 - 1.24 mg/dL Final   Glucose, Bld 98/75/7973 385 (H)  70 - 99 mg/dL Final   Calcium , Ion 08/21/2024 1.25  1.15 - 1.40 mmol/L Final   TCO2 08/21/2024 27  22 - 32 mmol/L Final   Hemoglobin 08/21/2024 14.3  13.0 - 17.0 g/dL Final   HCT 98/75/7973 42.0  39.0 - 52.0 % Final   Glucose-Capillary 08/21/2024 371 (H)  70 - 99 mg/dL Final   Glucose-Capillary 08/22/2024 341 (H)  70 - 99 mg/dL  Final     Vitals: Blood pressure (!) 124/96, pulse (!) 111, temperature 98.6 F (37 C), temperature source Oral, resp. rate 20, height 5' 11 (1.803 m), weight 95.7 kg, SpO2 100%.    Oliva DELENA Salmon, DO

## 2024-08-22 NOTE — Progress Notes (Signed)
" °   08/22/24 1300  Psych Admission Type (Psych Patients Only)  Admission Status Voluntary  Psychosocial Assessment  Patient Complaints Isolation  Eye Contact Fair  Facial Expression Flat  Affect Depressed  Speech Logical/coherent  Interaction Assertive  Motor Activity Slow  Appearance/Hygiene Disheveled  Behavior Characteristics Cooperative  Mood Depressed  Thought Process  Coherency WDL  Content WDL  Delusions None reported or observed  Perception WDL  Hallucination None reported or observed  Judgment Limited  Confusion None  Danger to Self  Current suicidal ideation? Denies  Danger to Others  Danger to Others None reported or observed    "

## 2024-08-22 NOTE — Progress Notes (Signed)
 Pt present with CBG of 484 mg/dl this evening, the scheduled 25 units of Lantus  was given as ordered. CBG rechecked 30 min later and read 550 mg/dl. NP on called notified and ordered one time 20 units novolog  and to recheck in two hours. Will continue to monitor.

## 2024-08-22 NOTE — Group Note (Signed)
 Date:  08/22/2024 Time:  10:02 AM  Group Topic/Focus:  Goals Group:   The focus of this group is to help patients establish daily goals to achieve during treatment and discuss how the patient can incorporate goal setting into their daily lives to aide in recovery.    Participation Level:  Did Not Attend   [name] [name] 08/22/2024, 10:02 AM

## 2024-08-22 NOTE — Plan of Care (Signed)
   Problem: Education: Goal: Knowledge of [facility] Education information/materials will improve Outcome: Progressing   Problem: Coping: Goal: Ability to verbalize frustrations and anger appropriately will improve Outcome: Progressing   Problem: Safety: Goal: Periods of time without injury will increase Outcome: Progressing

## 2024-08-22 NOTE — Plan of Care (Signed)
  Problem: Education: Goal: Mental status will improve Outcome: Progressing   Problem: Activity: Goal: Interest or engagement in activities will improve Outcome: Not Progressing   

## 2024-08-22 NOTE — Progress Notes (Signed)
 Pt came back from the ED, pt is ambulatory, alert and oriented. The writer was not given report before pt was sent back. Pt is in bed right now resting, will continue to monitor.

## 2024-08-22 NOTE — Progress Notes (Signed)
(  Sleep Hours) -6 (Any PRNs that were needed, meds refused, or side effects to meds)- Trazodone  (Any disturbances and when (visitation, over night)-n/a (Concerns raised by the patient)- high CBG, had to go [facility]ED for monitoring (SI/HI/AVH)-Denies

## 2024-08-22 NOTE — Group Note (Signed)
 Date:  08/22/2024 Time:  10:30 PM  Group Topic/Focus:  Wrap-Up Group:   The focus of this group is to help patients review their daily goal of treatment and discuss progress on daily workbooks.    Participation Level:  Did Not Attend  Participation Quality:  Resistant  Affect:  Resistant  Cognitive:  Lacking  Insight: Lacking  Engagement in Group:  Resistant  Modes of Intervention:  Discussion  Additional Comments:  Patient did not participate in wrap up group  Bari Moats 08/22/2024, 10:30 PM

## 2024-08-23 ENCOUNTER — Encounter (HOSPITAL_COMMUNITY): Payer: Self-pay

## 2024-08-23 LAB — GLUCOSE, CAPILLARY
Glucose-Capillary: 240 mg/dL — ABNORMAL HIGH (ref 70–99)
Glucose-Capillary: 248 mg/dL — ABNORMAL HIGH (ref 70–99)
Glucose-Capillary: 310 mg/dL — ABNORMAL HIGH (ref 70–99)
Glucose-Capillary: 365 mg/dL — ABNORMAL HIGH (ref 70–99)
Glucose-Capillary: 379 mg/dL — ABNORMAL HIGH (ref 70–99)
Glucose-Capillary: 385 mg/dL — ABNORMAL HIGH (ref 70–99)

## 2024-08-23 MED ORDER — INSULIN ASPART 100 UNIT/ML IJ SOLN
10.0000 [IU] | Freq: Three times a day (TID) | INTRAMUSCULAR | Status: DC
  Administered 2024-08-23 – 2024-08-24 (×2): 10 [IU] via SUBCUTANEOUS

## 2024-08-23 MED ORDER — ESCITALOPRAM OXALATE 5 MG PO TABS
5.0000 mg | ORAL_TABLET | Freq: Every day | ORAL | Status: DC
  Administered 2024-08-23 – 2024-08-25 (×3): 5 mg via ORAL
  Filled 2024-08-23: qty 1
  Filled 2024-08-23: qty 7
  Filled 2024-08-23 (×2): qty 1

## 2024-08-23 NOTE — Plan of Care (Signed)
" °  Problem: Education: Goal: Knowledge of [facility] Education information/materials will improve Outcome: Progressing   Problem: Activity: Goal: Interest or engagement in activities will improve Outcome: Progressing   Problem: Coping: Goal: Ability to verbalize frustrations and anger appropriately will improve Outcome: Progressing   Problem: Coping: Goal: Ability to adjust to condition or change in health will improve Outcome: Progressing   "

## 2024-08-23 NOTE — Progress Notes (Signed)
 Pt blood sugar rechecked at it came down to 365 mg/dl.

## 2024-08-23 NOTE — Progress Notes (Signed)
 Spiritual care group on grief and loss facilitated by Chaplain Rockie Sofia, Bcc  Group Goal: Support / Education around grief and loss  Members engage in facilitated group support and psycho-social education.  Group Description:  Following introductions and group rules, group members engaged in facilitated group dialogue and support around topic of loss, with particular support around experiences of loss in their lives. Group Identified types of loss (relationships / self / things) and identified patterns, circumstances, and changes that precipitate losses. Reflected on thoughts / feelings around loss, normalized grief responses, and recognized variety in grief experience. Group encouraged individual reflection on safe space and on the coping skills that they are already utilizing.  Group drew on Adlerian / Rogerian and narrative framework  Patient Progress: [name] [name] was present for the last few minutes of group. His verbal participation was minimal, but he demonstrated some engagement in the group conversation.

## 2024-08-23 NOTE — Progress Notes (Signed)
 [facility] Inpatient Psychiatry Progress Note  Date: 08/23/24 Patient: [name] [name] MRN: [id number]  Assessment and Plan: Mr. [name] [name] is a 39 y/o male with poorly controlled DM1 and no significant prior psychiatric history who was admitted voluntarily to inpatient psychiatry after presented to the ED with suicidal ideation with thoughts of not taking his insulin  or overdosing on pills.    While he endorses a depressed mood he does not meet criteria for a major depressive episode and describes his current symptoms as a direct result of his lack of housing, social support, and chronic medical problems. He is not interested in pharmacotherapy at this time and expresses interest in exploring opportunities to obtain transportation back to [place], [place].  1/25 - BS remains elevated. Increase mealtime insulin  to 10 units.  Patient reports typical home dose of mealtime insulin  is 25 units.  Blood glucose this morning was 240 and 385 before lunch.  He is also amenable to initiating Lexapro  for depression.  He appears to have a mildly restricted affect and does endorse low mood.  Denies any history of mania or psychosis.  Patient request discharge today or tomorrow.  He is agreeable to continuing to tomorrow though wants to be discharged no later than tomorrow.  No alarming subjective information such as SI, HI or AVH.  No delusional content.  Patient is not withdrawing and has good behavior.  I would like to see better blood glucose control and wound care though patient states that he would rather discharge and care for this on the outpatient.  # Adjustment disorder with depressed mood - Patient is amenable to starting antidepressant, denies history of mania or bipolar - Start Lexapro  5 mg  # Soft Tissue Infection - Cephalexin  500 mg q12hr for 7 days, started 1/24  # DM1 - Insulin  aspart sliding scale - regular - Increase insulin  aspart 10 units daily with meals - Insulin   glargine 25 units hs  # HTN - Lisinopril  5 mg daily - Amlodipine  10 mg daily - Hydralazine  25 mg q8h prn BP > 140/90  Risk Assessment - Low  Discharge Planning Estimated length of stay: 1 to 2 days Predicted Discharge location: Unclear, [place]?     Interval History and update:   - Blood glucose continues to be problematic.  Blood glucose this morning was 240 and 385 before lunchtime.  Patient reports that he takes 25 units Lantus  at night and 25 units before meals.  We discussed increasing mealtime insulin  to 10 units. - He reports his mood is good and affect is mildly restricted.  He states that he was feeling depressed coming in and has not experienced depression in the past.  He is amenable to initiating Lexapro .  We discussed risk, benefits and side effects to include abdominal pain, loose stool, headache and in rare circumstances serotonin syndrome which can result in seizures, coma and death.  He agrees to treatment.  He states that he has never had any history of mania, psychosis.  Denies SI, HI and AVH.  Sleep and appetite are both stable.  Tolerating his medication regimen well.  Patient states that he would like to discharge today or tomorrow.  I informed him that I would like to get his blood sugar under better control.  He understands this and is agreeable to staying till tomorrow.  He has gone to the [facility] in the past and request that we provide referral to the [facility].  After that, he states he will go  live with a friend by the name of Bobetta here in town.  Review of systems - Denies nausea, vomiting, diarrhea, abdominal pain, shortness of breath, chest pain. - Endorses right upper arm pain where his injury is.  Slight amount of draining, no redness, erythema or swelling.       Physical Exam MSK/Neuro - Normal gait and station  Mental Status Exam Appearance - Casually dressed, appropriate hygiene and grooming  Attitude - Calm, polite, not guarded Speech - normal volume, prosody,  inflection Mood - Alright Affect - Mildly restricted, appropriate reactive Thought Process - LLGD Thought Content - No delusional TC expressed SI/HI - Denies  Perceptions - Denies AVH; not RIS Judgement/Insight - Fair to good Fund of knowledge - WNL Language - No impairments      Lab Results:  Admission on 08/21/2024  Component Date Value Ref Range Status   Glucose-Capillary 08/21/2024 522 (HH)  70 - 99 mg/dL Final   Comment 1 98/75/7973 Notify RN   Final   Glucose-Capillary 08/21/2024 372 (H)  70 - 99 mg/dL Final   Glucose-Capillary 08/21/2024 362 (H)  70 - 99 mg/dL Final   Glucose-Capillary 08/21/2024 501 (HH)  70 - 99 mg/dL Final   Comment 1 98/75/7973 Notify RN   Final   Sodium 08/21/2024 133 (L)  135 - 145 mmol/L Final   Potassium 08/21/2024 4.3  3.5 - 5.1 mmol/L Final   Chloride 08/21/2024 97 (L)  98 - 111 mmol/L Final   BUN 08/21/2024 14  6 - 20 mg/dL Final   Creatinine, Ser 08/21/2024 1.10  0.61 - 1.24 mg/dL Final   Glucose, Bld 98/75/7973 385 (H)  70 - 99 mg/dL Final   Calcium , Ion 08/21/2024 1.25  1.15 - 1.40 mmol/L Final   TCO2 08/21/2024 27  22 - 32 mmol/L Final   Hemoglobin 08/21/2024 14.3  13.0 - 17.0 g/dL Final   HCT 98/75/7973 42.0  39.0 - 52.0 % Final   Glucose-Capillary 08/21/2024 371 (H)  70 - 99 mg/dL Final   Glucose-Capillary 08/22/2024 341 (H)  70 - 99 mg/dL Final   Glucose-Capillary 08/22/2024 486 (H)  70 - 99 mg/dL Final   Glucose-Capillary 08/22/2024 291 (H)  70 - 99 mg/dL Final   Glucose-Capillary 08/22/2024 484 (H)  70 - 99 mg/dL Final   Glucose-Capillary 08/22/2024 550 (HH)  70 - 99 mg/dL Final   Glucose-Capillary 08/23/2024 365 (H)  70 - 99 mg/dL Final   Glucose-Capillary 08/23/2024 310 (H)  70 - 99 mg/dL Final   Glucose-Capillary 08/23/2024 240 (H)  70 - 99 mg/dL Final     Vitals: Blood pressure (!) 135/94, pulse (!) 101, temperature 98.2 F (36.8 C), temperature source Oral, resp. rate 18, height 5' 11 (1.803 m), weight 95.7 kg, SpO2 100%.     Lamar Handler Jama Slain, DO

## 2024-08-23 NOTE — BH IP Treatment Plan (Signed)
 Interdisciplinary Treatment and Diagnostic Plan Update  08/23/2024 Time of Session: 10:45 AM [name] [name] MRN: [id number]  Principal Diagnosis: Adjustment disorder with depressed mood  Secondary Diagnoses: Principal Problem:   Adjustment disorder with depressed mood Active Problems:   Suicidal ideation   Current Medications:  Current Facility-Administered Medications  Medication Dose Route Frequency Provider Last Rate Last Admin   acetaminophen  (TYLENOL ) tablet 650 mg  650 mg Oral Q6H PRN Bobbitt, Shalon E, NP       alum & mag hydroxide-simeth (MAALOX/MYLANTA) 200-200-20 MG/5ML suspension 30 mL  30 mL Oral Q4H PRN Bobbitt, Shalon E, NP       amLODipine  (NORVASC ) tablet 10 mg  10 mg Oral Daily Bobbitt, Shalon E, NP   10 mg at 08/23/24 9188   cephALEXin  (KEFLEX ) capsule 500 mg  500 mg Oral Q12H Bouchard, Marc A, DO   500 mg at 08/23/24 9185   haloperidol  (HALDOL ) tablet 5 mg  5 mg Oral TID PRN Bobbitt, Shalon E, NP       And   diphenhydrAMINE  (BENADRYL ) capsule 50 mg  50 mg Oral TID PRN Bobbitt, Shalon E, NP       haloperidol  lactate (HALDOL ) injection 5 mg  5 mg Intramuscular TID PRN Bobbitt, Shalon E, NP       And   diphenhydrAMINE  (BENADRYL ) injection 50 mg  50 mg Intramuscular TID PRN Bobbitt, Shalon E, NP       And   LORazepam  (ATIVAN ) injection 2 mg  2 mg Intramuscular TID PRN Bobbitt, Shalon E, NP       haloperidol  lactate (HALDOL ) injection 10 mg  10 mg Intramuscular TID PRN Bobbitt, Shalon E, NP       And   diphenhydrAMINE  (BENADRYL ) injection 50 mg  50 mg Intramuscular TID PRN Bobbitt, Shalon E, NP       And   LORazepam  (ATIVAN ) injection 2 mg  2 mg Intramuscular TID PRN Bobbitt, Shalon E, NP       escitalopram  (LEXAPRO ) tablet 5 mg  5 mg Oral Daily Chandra Charleston Christian Lee, DO   5 mg at 08/23/24 1349   hydrALAZINE  (APRESOLINE ) tablet 25 mg  25 mg Oral Q8H Bobbitt, Shalon E, NP   25 mg at 08/23/24 1349   hydrOXYzine  (ATARAX ) tablet 25 mg  25 mg Oral TID PRN Bobbitt,  Shalon E, NP   25 mg at 08/23/24 9187   insulin  aspart (novoLOG ) injection 0-15 Units  0-15 Units Subcutaneous TID WC Bouchard, Marc A, DO   15 Units at 08/23/24 1208   insulin  aspart (novoLOG ) injection 10 Units  10 Units Subcutaneous TID with meals Chandra Charleston Sherlean Ruth, DO       insulin  glargine (LANTUS ) injection 25 Units  25 Units Subcutaneous QHS Prentis Kitchens A, DO   25 Units at 08/22/24 2117   lisinopril  (ZESTRIL ) tablet 5 mg  5 mg Oral Daily Prentis Kitchens A, DO   5 mg at 08/23/24 9187   magnesium  hydroxide (MILK OF MAGNESIA) suspension 30 mL  30 mL Oral Daily PRN Bobbitt, Shalon E, NP       nicotine  polacrilex (NICORETTE ) gum 2 mg  2 mg Oral PRN Bouchard, Marc A, DO       traZODone  (DESYREL ) tablet 50 mg  50 mg Oral QHS PRN Bobbitt, Shalon E, NP   50 mg at 08/22/24 2114   PTA Medications: Medications Prior to Admission  Medication Sig Dispense Refill Last Dose/Taking   amLODipine  (NORVASC ) 10 MG tablet Take  1 tablet (10 mg total) by mouth daily. (Patient not taking: Reported on 08/20/2024) 30 tablet 1    amLODipine  (NORVASC ) 5 MG tablet Take 5 mg by mouth daily. (Patient not taking: Reported on 08/20/2024)      hydrALAZINE  (APRESOLINE ) 25 MG tablet Take 1 tablet (25 mg total) by mouth every 8 (eight) hours. (Patient not taking: Reported on 05/24/2024) 90 tablet 0    Insulin  Aspart FlexPen (NOVOLOG ) 100 UNIT/ML SMARTSIG:8 Unit(s) SUB-Q 3 Times Daily (Patient not taking: Reported on 08/20/2024)      insulin  glargine (LANTUS  SOLOSTAR) 100 UNIT/ML Solostar Pen Inject 20 Units into the skin daily. (Patient not taking: Reported on 08/20/2024) 15 mL 11    insulin  lispro (HUMALOG  KWIKPEN) 100 UNIT/ML KwikPen Inject 8 Units into the skin 3 (three) times daily. (Patient taking differently: Inject 15 Units into the skin 3 (three) times daily.) 15 mL 11    Lancet Device MISC Use 3 (three) times daily. 1 each 0     Patient Stressors: Financial difficulties   Health problems   Medication change  or noncompliance   Occupational concerns   Other: Family lives in [place]    Patient Strengths: Ability for insight  Communication skills   Treatment Modalities: Medication Management, Group therapy, Case management,  1 to 1 session with clinician, Psychoeducation, Recreational therapy.   Physician Treatment Plan for Primary Diagnosis: Adjustment disorder with depressed mood Long Term Goal(s):     Short Term Goals:    Medication Management: Evaluate patient's response, side effects, and tolerance of medication regimen.  Therapeutic Interventions: 1 to 1 sessions, Unit Group sessions and Medication administration.  Evaluation of Outcomes: Adequate for Discharge  Physician Treatment Plan for Secondary Diagnosis: Principal Problem:   Adjustment disorder with depressed mood Active Problems:   Suicidal ideation  Long Term Goal(s):     Short Term Goals:       Medication Management: Evaluate patient's response, side effects, and tolerance of medication regimen.  Therapeutic Interventions: 1 to 1 sessions, Unit Group sessions and Medication administration.  Evaluation of Outcomes: Adequate for Discharge   RN Treatment Plan for Primary Diagnosis: Adjustment disorder with depressed mood Long Term Goal(s): Knowledge of disease and therapeutic regimen to maintain health will improve  Short Term Goals: Ability to remain free from injury will improve, Ability to verbalize frustration and anger appropriately will improve, Ability to demonstrate self-control, Ability to participate in decision making will improve, Ability to verbalize feelings will improve, Ability to disclose and discuss suicidal ideas, Ability to identify and develop effective coping behaviors will improve, and Compliance with prescribed medications will improve  Medication Management: RN will administer medications as ordered by provider, will assess and evaluate patient's response and provide education to patient for  prescribed medication. RN will report any adverse and/or side effects to prescribing provider.  Therapeutic Interventions: 1 on 1 counseling sessions, Psychoeducation, Medication administration, Evaluate responses to treatment, Monitor vital signs and CBGs as ordered, Perform/monitor CIWA, COWS, AIMS and Fall Risk screenings as ordered, Perform wound care treatments as ordered.  Evaluation of Outcomes: Adequate for Discharge   LCSW Treatment Plan for Primary Diagnosis: Adjustment disorder with depressed mood Long Term Goal(s): Safe transition to appropriate next level of care at discharge, Engage patient in therapeutic group addressing interpersonal concerns.  Short Term Goals: Engage patient in aftercare planning with referrals and resources, Increase social support, Increase ability to appropriately verbalize feelings, Increase emotional regulation, Facilitate acceptance of mental health diagnosis and concerns, Facilitate patient progression  through stages of change regarding substance use diagnoses and concerns, Identify triggers associated with mental health/substance abuse issues, and Increase skills for wellness and recovery  Therapeutic Interventions: Assess for all discharge needs, 1 to 1 time with Social worker, Explore available resources and support systems, Assess for adequacy in community support network, Educate family and significant other(s) on suicide prevention, Complete Psychosocial Assessment, Interpersonal group therapy.  Evaluation of Outcomes: Adequate for Discharge   Progress in Treatment: Attending groups: Patient has attended some groups.  Participating in groups: Yes. Taking medication as prescribed: Yes. Toleration medication: Yes. Family/Significant other contact made: No, will contact:  Patient declined consents.  Patient understands diagnosis: Yes. Discussing patient identified problems/goals with staff: Yes. Medical problems stabilized or resolved:  Yes. Denies suicidal/homicidal ideation: Yes. Issues/concerns per patient self-inventory: No. None reported.   New problem(s) identified: No, Describe:  None identified.  New Short Term/Long Term Goal(s): detox, medication management for mood stabilization; elimination of SI thoughts; development of comprehensive mental wellness/sobriety plan   Patient Goals: Learn communication skills  Discharge Plan or Barriers: Patient recently admitted. CSW will continue to follow and assess for appropriate referrals and possible discharge planning.    Reason for Continuation of Hospitalization: Medication stabilization  Estimated Length of Stay: 1-2 days  Last 3 [place] Suicide Severity Risk Score: Flowsheet Row ED to Hosp-Admission (Current) [facility] from 08/19/2024 in [facility] Emergency Department at [facility] ED from 08/18/2024 in [facility] Emergency Department at [facility]  C-SSRS RISK CATEGORY No Risk High Risk Moderate Risk    Last [facility] 2/9 Scores:    08/20/2024    2:27 AM  Depression screen PHQ 2/9  Decreased Interest 2  Down, Depressed, Hopeless 2  PHQ - 2 Score 4  Altered sleeping 3  Tired, decreased energy 2  Change in appetite 0  Feeling bad or failure about yourself  2  Trouble concentrating 0  Moving slowly or fidgety/restless 1  Suicidal thoughts 2  PHQ-9 Score 14  Difficult doing work/chores Very difficult    Scribe for Treatment Team: Jaymz Traywick  Nunez-Uva, LCSWA 08/23/2024 3:40 PM

## 2024-08-23 NOTE — Group Note (Signed)
 Date:  08/23/2024 Time:  8:34 PM  Group Topic/Focus:  Recovery Goals:   The focus of this group is to identify appropriate goals for recovery and establish a plan to achieve them.    Participation Level:  Did Not Attend  Participation Quality:    Affect:    Cognitive:    Insight:   Engagement in Group:    Modes of Intervention:    Additional Comments:    Ellouise Dama Molt 08/23/2024, 8:34 PM

## 2024-08-23 NOTE — [facility] Group Notes (Signed)
 [facility] Group Notes:  (Nursing/MHT/Case Management/Adjunct)  Date:  08/23/2024  Time:  10:42 PM  Type of Therapy:  Group Therapy  Participation Level:  Did Not Attend  Participation Quality:  Did not attend   Affect:  Did not attend   Cognitive:  Did not attend   Insight:  None  Engagement in Group:  Did not attend   Modes of Intervention:  Did not attend   Summary of Progress/Problems:The patient did not attend the evening A.A. meeting.   Demarious Kapur S 08/23/2024, 10:42 PM

## 2024-08-23 NOTE — Progress Notes (Signed)
 Chaplains received a consult that [name] [name] was requesting a visit.  I met with him briefly and he asked if we could speak tomorrow.  I will follow up at that time.

## 2024-08-23 NOTE — Plan of Care (Signed)
   Problem: Education: Goal: Emotional status will improve Outcome: Not Progressing Goal: Mental status will improve Outcome: Not Progressing

## 2024-08-23 NOTE — Group Note (Signed)
 Date:  08/23/2024 Time:  10:23 AM  Group Topic/Focus:  Goals Group:   The focus of this group is to help patients establish daily goals to achieve during treatment and discuss how the patient can incorporate goal setting into their daily lives to aide in recovery.    Participation Level:  Active  Participation Quality:  Attentive  Affect:  Appropriate  Cognitive:  Alert  Insight: Good  Engagement in Group:  Engaged  Modes of Intervention:  Discussion  Additional Comments:    Avelina DELENA Humphreys 08/23/2024, 10:23 AM

## 2024-08-23 NOTE — Progress Notes (Signed)
(  Sleep Hours) -8.25 (Any PRNs that were needed, meds refused, or side effects to meds)- Trazodone  (Any disturbances and when (visitation, over night)-n/a (Concerns raised by the patient)- none (SI/HI/AVH)-Denies

## 2024-08-23 NOTE — Group Note (Signed)
 Date:  08/23/2024 Time:  11:40 AM  Group Topic/Focus:  Wellness Toolbox:   The focus of this group is to discuss various aspects of wellness, balancing those aspects and exploring ways to increase the ability to experience wellness.  Patients will create a wellness toolbox for use upon discharge.    Participation Level:  Active  Participation Quality:  Attentive  Affect:  Appropriate  Cognitive:  Alert  Insight: Good  Engagement in Group:  Engaged  Modes of Intervention:  Exercise/Education  Additional Comments:    Avelina DELENA Humphreys 08/23/2024, 11:40 AM

## 2024-08-23 NOTE — Progress Notes (Signed)
 Tour of Duty:  Prentice [name] Angle, RN, 08/23/24, Tour of Duty: 0700-1900  SI/HI/AVH: Denies  Self-Reported   Mood: Neutral  Anxiety: Denies Depression: Denies, but Observable Irritability: Denies  Broset  Violence Prevention Guidelines *See Row Information*: Small Violence Risk interventions implemented   LBM  Last BM Date : 08/22/24   Pain: not present  Patient Refusals (including Rx): No  >>Shift Summary: Patient observed to be withdrawn on unit. Patient able to make needs known. Patient observed to engage appropriately with staff and peers, but little to no initiation. Patient taking medications as prescribed. This shift, no PRN medication requested or required. No reported or observed side effects to medication. No reported or observed agitation, aggression, or other acute emotional distress. Patient wound dressing changed, only roughly 2 in of packing strip used on wound. No reported or observed new onset physical abnormalities or concerns.  Last Vitals  Vitals Weight: 95.7 kg Temp: 98.2 F (36.8 C) Temp Source: Oral Pulse Rate: (!) 110 Resp: 18 BP: (!) 126/95 Patient Position: (not recorded)  Admission Type  Psych Admission Type (Psych Patients Only) Admission Status: Voluntary Date 72 hour document signed : (not recorded) Time 72 hour document signed : (not recorded) Provider Notified (First and Last Name) (see details for LINK to note): (not recorded)   Psychosocial Assessment  Psychosocial Assessment Patient Complaints: None Eye Contact: Fair Facial Expression: Flat Affect: Flat Speech: Logical/coherent Interaction: Minimal Motor Activity: Slow Appearance/Hygiene: Disheveled Behavior Characteristics: Cooperative Mood: Depressed   Aggressive Behavior  Targets: (not recorded)   Thought Process  Thought Process Coherency: Within Defined Limits Content: Within Defined Limits Delusions: None reported or observed Perception: Within Defined  Limits Hallucination: None reported or observed Judgment: Limited Confusion: None  Danger to Self/Others  Danger to Self Current suicidal ideation?: Denies Description of Suicide Plan: (not recorded) Self-Injurious Behavior: (not recorded) Agreement Not to Harm Self: (not recorded) Description of Agreement: (not recorded) Danger to Others: None reported or observed

## 2024-08-24 LAB — GLUCOSE, CAPILLARY
Glucose-Capillary: 395 mg/dL — ABNORMAL HIGH (ref 70–99)
Glucose-Capillary: 289 mg/dL — ABNORMAL HIGH (ref 70–99)
Glucose-Capillary: 317 mg/dL — ABNORMAL HIGH (ref 70–99)
Glucose-Capillary: 318 mg/dL — ABNORMAL HIGH (ref 70–99)

## 2024-08-24 MED ORDER — INSULIN ASPART 100 UNIT/ML IJ SOLN
15.0000 [IU] | Freq: Three times a day (TID) | INTRAMUSCULAR | Status: DC
  Administered 2024-08-24 – 2024-08-25 (×3): 15 [IU] via SUBCUTANEOUS
  Filled 2024-08-24: qty 0.15

## 2024-08-24 MED ORDER — SODIUM CHLORIDE 0.9 % IN NEBU
INHALATION_SOLUTION | RESPIRATORY_TRACT | Status: AC
  Filled 2024-08-24: qty 3

## 2024-08-24 NOTE — Group Note (Signed)
 Date:  08/24/2024 Time:  9:17 PM  Group Topic/Focus:  Wrap-Up Group:   The focus of this group is to help patients review their daily goal of treatment and discuss progress on daily workbooks.      Additional Comments:  Pt was encouraged to attend, however the Pt did not attend.   [name] 08/24/2024, 9:17 PM

## 2024-08-24 NOTE — Progress Notes (Signed)
" °   08/23/24 2102  Psych Admission Type (Psych Patients Only)  Admission Status Voluntary  Psychosocial Assessment  Patient Complaints None  Eye Contact Fair  Facial Expression Flat  Affect Flat  Speech Logical/coherent  Interaction Minimal  Motor Activity Slow  Appearance/Hygiene Disheveled;Poor hygiene;In scrubs  Behavior Characteristics Cooperative  Mood Depressed  Thought Process  Coherency WDL  Content WDL  Delusions None reported or observed  Perception WDL  Hallucination None reported or observed  Judgment Limited  Confusion None  Danger to Self  Current suicidal ideation? Denies  Danger to Others  Danger to Others None reported or observed    "

## 2024-08-24 NOTE — Group Note (Signed)
 Date:  08/24/2024 Time:  12:41 PM  Group Topic/Focus: Social work- Investment Banker, [employer] Together:  Building Healthy Relationships  Building Self Esteem:   The Focus of this group is helping patients become aware of the effects of self-esteem on their lives, the things they and others do that enhance or undermine their self-esteem, seeing the relationship between their level of self-esteem and the choices they make and learning ways to enhance self-esteem.    Participation Level:  Active  [name] [name] 08/24/2024, 12:41 PM

## 2024-08-24 NOTE — Progress Notes (Signed)
 [facility] Inpatient Psychiatry Progress Note  Date: 08/24/24 Patient: [name] [name] MRN: [id number]  Assessment and Plan: Mr. [name] [name] is a 39 y/o male with poorly controlled DM1 and no significant prior psychiatric history who was admitted voluntarily to inpatient psychiatry after presented to the ED with suicidal ideation with thoughts of not taking his insulin  or overdosing on pills.    While he endorses a depressed mood he does not meet criteria for a major depressive episode and describes his current symptoms as a direct result of his lack of housing, social support, and chronic medical problems. He is not interested in pharmacotherapy at this time and expresses interest in exploring opportunities to obtain transportation back to [place], [place].  1/27 - Diabetes Management recommends increasing AC insulin  to 15units per meal scheduled and Lantus  to 25units BID. Plan is to D/C tomorrow thus will be unable to change to the new Lantus  schedule. Started Lexapro  with no SE. Mood is good and interacting well with staff.  In terms of mood, affect and behavior, the pt is stable and ready for DC. He will go to the [facility] and stay with a friend.  # Adjustment disorder with depressed mood - Continue Lexapro  5 mg  # Soft Tissue Infection - Cephalexin  500 mg q12hr for 7 days, started 1/24  # DM1 - Insulin  aspart sliding scale - regular - Increase insulin  Novolog  15 units daily with meals - Insulin  Lantus  25 units hs  # HTN - Lisinopril  5 mg daily - Amlodipine  10 mg daily - Hydralazine  25 mg q8h prn BP > 140/90  Risk Assessment - Low  Discharge Planning Estimated length of stay: 08/25/2024 Predicted Discharge location: [facility]     Interval History and update:   -VSS, chart reviewed, no events over night. -AM BG was 395 though stabilized at lunch with higher scheduled AC insult Novolog . -The pt is calm, friendly and interacts well with me. Denies SI, HI and AVH.  Sleep and appetite are stable. ROS is negative other than arm pain, though reports improvement in that. Discussed the increase in [facility] insulin  to 15units.  -The patient requests discharge for tomorrow and I agree given his mood stability. -Tolerated introduction of lexapro  well, no SE. -Pt requests to follow up with [facility] since he has understanding of how to navigate this program. No delusional content. Affect is euthymic and no reported severe symptoms of depression or anxiety.    Review of systems - Denies nausea, vomiting, diarrhea, abdominal pain, shortness of breath, chest pain. - Endorses right upper arm pain where his injury is.  Slight amount of draining, no redness, erythema or swelling.       Physical Exam MSK/Neuro - Normal gait and station    Mental Status Exam Appearance - Casually dressed, appropriate hygiene and grooming  Attitude - Calm, polite, not guarded Speech - normal volume, prosody, inflection Mood - doing well Affect - euthymic Thought Process - LLGD Thought Content - No delusional TC expressed SI/HI - Denies  Perceptions - Denies AVH; not RIS Judgement/Insight - Fair to good Fund of knowledge - WNL Language - No impairments      Lab Results:  Admission on 08/21/2024  Component Date Value Ref Range Status   Glucose-Capillary 08/21/2024 522 (HH)  70 - 99 mg/dL Final   Comment 1 98/75/7973 Notify RN   Final   Glucose-Capillary 08/21/2024 372 (H)  70 - 99 mg/dL Final   Glucose-Capillary 08/21/2024 362 (H)  70 - 99  mg/dL Final   Glucose-Capillary 08/21/2024 501 (HH)  70 - 99 mg/dL Final   Comment 1 98/75/7973 Notify RN   Final   Sodium 08/21/2024 133 (L)  135 - 145 mmol/L Final   Potassium 08/21/2024 4.3  3.5 - 5.1 mmol/L Final   Chloride 08/21/2024 97 (L)  98 - 111 mmol/L Final   BUN 08/21/2024 14  6 - 20 mg/dL Final   Creatinine, Ser 08/21/2024 1.10  0.61 - 1.24 mg/dL Final   Glucose, Bld 98/75/7973 385 (H)  70 - 99 mg/dL Final   Calcium , Ion  08/21/2024 1.25  1.15 - 1.40 mmol/L Final   TCO2 08/21/2024 27  22 - 32 mmol/L Final   Hemoglobin 08/21/2024 14.3  13.0 - 17.0 g/dL Final   HCT 98/75/7973 42.0  39.0 - 52.0 % Final   Glucose-Capillary 08/21/2024 371 (H)  70 - 99 mg/dL Final   Glucose-Capillary 08/22/2024 341 (H)  70 - 99 mg/dL Final   Glucose-Capillary 08/22/2024 486 (H)  70 - 99 mg/dL Final   Glucose-Capillary 08/22/2024 291 (H)  70 - 99 mg/dL Final   Glucose-Capillary 08/22/2024 484 (H)  70 - 99 mg/dL Final   Glucose-Capillary 08/22/2024 550 (HH)  70 - 99 mg/dL Final   Glucose-Capillary 08/23/2024 365 (H)  70 - 99 mg/dL Final   Glucose-Capillary 08/23/2024 310 (H)  70 - 99 mg/dL Final   Glucose-Capillary 08/23/2024 240 (H)  70 - 99 mg/dL Final   Glucose-Capillary 08/23/2024 385 (H)  70 - 99 mg/dL Final   Glucose-Capillary 08/23/2024 379 (H)  70 - 99 mg/dL Final   Glucose-Capillary 08/23/2024 248 (H)  70 - 99 mg/dL Final   Glucose-Capillary 08/24/2024 395 (H)  70 - 99 mg/dL Final   Glucose-Capillary 08/24/2024 289 (H)  70 - 99 mg/dL Final     Vitals: Blood pressure 108/77, pulse (!) 112, temperature 98.2 F (36.8 C), resp. rate 18, height 5' 11 (1.803 m), weight 95.7 kg, SpO2 100%.    Lamar Handler Jama Slain, DO

## 2024-08-24 NOTE — Group Note (Signed)
 Date:  08/24/2024 Time:  5:53 PM  Group Topic/Focus:  Wellness Toolbox:   The focus of this group is to discuss various aspects of wellness, balancing those aspects and exploring ways to increase the ability to experience wellness.  Patients will create a wellness toolbox for use upon discharge.    Participation Level:  Did Not Attend   [name] [name] 08/24/2024, 5:53 PM

## 2024-08-24 NOTE — Group Note (Signed)
 LCSW Group Therapy Note   Group Date: 08/24/2024 Start Time: 1100 End Time: 1200   Participation:  patient was present.  At times he was disruptive, talking to another patient, and somewhat responsive to redirection.    Type of Therapy:  Group Therapy  Topic:  Stronger Together:  Building Healthy Relationships  Objective:  To explore loneliness, boundaries, and safe ways to build relationships.  Goals: Recognize healthy vs. unhealthy relationships. Learn safe ways to connect with others. Strengthen communication and murphy oil.  Summary:  Participants discussed loneliness, healthy connections, and setting boundaries. They explored safe ways to meet people and shared personal experiences. Key insights were reinforced through discussion and quotes.  Therapeutic Modalities Used: Cognitive Behavioral Therapy (CBT) Elements - Identifying unhealthy relationship patterns, challenging negative thoughts about connection. Dialectical Behavior Therapy (DBT) Elements - Interpersonal effectiveness, setting and maintaining boundaries. Supportive Group Therapy - Peer discussion, shared experiences, and emotional validation.   [name] [name] O Knowledge Escandon, LCSW 08/24/2024  12:16 PM

## 2024-08-24 NOTE — Progress Notes (Signed)
 Patient in [facility]  Barnie Bora  [facility]  [facility] Guide  Direct Dial: [phone]  Fax [phone]

## 2024-08-24 NOTE — Progress Notes (Signed)
(  Sleep Hours) - 8.5  (Any PRNs that were needed, meds refused, or side effects to meds)- Hydroxyzine  & Trazodone  PRN  (Any disturbances and when (visitation, over night)- none  (Concerns raised by the patient)- none  (SI/HI/AVH)- denies

## 2024-08-24 NOTE — Group Note (Signed)
 Date:  08/24/2024 Time:  10:19 AM  Group Topic/Focus: Pet Therapy  Self Care:   The focus of this group is to help patients understand the importance of self-care in order to improve or restore emotional, physical, spiritual, interpersonal, and financial health.    Participation Level:  Active  Dolores HERO Kenley Troop 08/24/2024, 10:19 AM

## 2024-08-24 NOTE — Plan of Care (Signed)
   Problem: Education: Goal: Emotional status will improve Outcome: Progressing Goal: Mental status will improve Outcome: Progressing

## 2024-08-24 NOTE — Progress Notes (Signed)
" °   08/24/24 1610  Spiritual Encounters  Type of Visit Attempt (pt unavailable)   Per referral from Chaplain Lamarr Sofia, I attempted to offer support to [name] [name]. At time of visit he was sleeping soundly. Will ask for chaplain f/u.  Azalyn Sliwa L. Delores HERO.Div "

## 2024-08-24 NOTE — Inpatient Diabetes Management (Signed)
 Inpatient Diabetes Program Recommendations  AACE/ADA: New Consensus Statement on Inpatient Glycemic Control (2015)  Target Ranges:  Prepandial:   less than 140 mg/dL      Peak postprandial:   less than 180 mg/dL (1-2 hours)      Critically ill patients:  140 - 180 mg/dL   Lab Results  Component Value Date   GLUCAP 395 (H) 08/24/2024   HGBA1C >15.5 (H) 02/28/2024    Review of Glycemic Control  Latest Reference Range & Units 08/23/24 06:07 08/23/24 12:00 08/23/24 17:17 08/23/24 19:56 08/24/24 06:12  Glucose-Capillary 70 - 99 mg/dL 759 (H) 614 (H) 620 (H) 248 (H) 395 (H)  (H): Data is abnormally high  Diabetes history: DM2  Outpatient Diabetes medications: Lantus  20 units every day (not taking),  15 units BID                        Current orders for Inpatient glycemic control: Lantus  25 units every day, Novolog  10 units TID, Novolog  0-15 units TID  Inpatient Diabetes Program Recommendations:    Please consider:  Lantus  20 units BID (start this morning) Novolog  15 units TID with meals if he consumes at least 50%  Thank you, Wyvonna Pinal, MSN, CDCES Diabetes Coordinator Inpatient Diabetes Program [phone] (team pager from 8a-5p)

## 2024-08-24 NOTE — Group Note (Signed)
 Date:  08/24/2024 Time:  9:45 AM  Group Topic/Focus: Goals and orientation Goals Group:   The focus of this group is to help patients establish daily goals to achieve during treatment and discuss how the patient can incorporate goal setting into their daily lives to aide in recovery. Orientation:   The focus of this group is to educate the patient on the purpose and policies of crisis stabilization and provide a format to answer questions about their admission.  The group details unit policies and expectations of patients while admitted.    Participation Level:  Minimal  Participation Quality:  Appropriate  Affect:  Appropriate  Cognitive:  Appropriate  Insight: Appropriate  Engagement in Group:  Engaged  Modes of Intervention:  Discussion  Additional Comments:    Dolores CHRISTELLA Fredericks 08/24/2024, 9:45 AM

## 2024-08-24 NOTE — Plan of Care (Signed)
  Problem: Coping: Goal: Ability to demonstrate self-control will improve Outcome: Progressing   Problem: Health Behavior/Discharge Planning: Goal: Compliance with treatment plan for underlying cause of condition will improve Outcome: Progressing   Problem: Physical Regulation: Goal: Ability to maintain clinical measurements within normal limits will improve Outcome: Progressing   

## 2024-08-24 NOTE — Group Note (Signed)
 Recreation Therapy Group Note   Group Topic:Animal Assisted Therapy   Group Date: 08/24/2024 Start Time: 9051 End Time: 1028 Facilitators: Karinna Beadles-McCall, LRT,CTRS Location: 300 Hall Dayroom   Animal-Assisted Activity (AAA) Program Checklist/Progress Notes Patient Eligibility Criteria Checklist & Daily Group note for Rec Tx Intervention  AAA/T Program Assumption of Risk Form signed by Patient/ or Parent Legal Guardian Yes  Patient is free of allergies or severe asthma Yes  Patient reports no fear of animals Yes  Patient reports no history of cruelty to animals Yes  Patient understands his/her participation is voluntary Yes  Patient washes hands before animal contact Yes  Patient washes hands after animal contact Yes  Behavioral Response: Attentive   Education: Hand Washing, Appropriate Animal Interaction   Education Outcome: Acknowledges education.    Affect/Mood: Appropriate   Participation Level: Active   Participation Quality: Independent   Behavior: Attentive    Speech/Thought Process: Focused   Insight: Good   Judgement: Good   Modes of Intervention: Teaching Laboratory Technician   Patient Response to Interventions:  Attentive   Education Outcome:  In group clarification offered    Clinical Observations/Individualized Feedback: Pt was quiet but had some interaction with therapy dog team. Pt left for a moment but later returned.     Plan: Continue to engage patient in RT group sessions 2-3x/week.   Consuella Scurlock-McCall, LRT,CTRS 08/24/2024 12:36 PM

## 2024-08-24 NOTE — Group Note (Signed)
 Date:  08/24/2024 Time:  4:12 PM  Group Topic/Focus: Drug and alcohol substance abuse Diagnosis Education:   The focus of this group is to discuss the major disorders that patients maybe diagnosed with.  Group discusses the importance of knowing what one's diagnosis is so that one can understand treatment and better advocate for oneself.    Participation Level:  Did Not Attend   [name] [name] 08/24/2024, 4:12 PM

## 2024-08-25 LAB — GLUCOSE, CAPILLARY: Glucose-Capillary: 264 mg/dL — ABNORMAL HIGH (ref 70–99)

## 2024-08-25 MED ORDER — LISINOPRIL 5 MG PO TABS: 5.0000 mg | ORAL_TABLET | Freq: Every day | ORAL | 0 refills | Status: DC

## 2024-08-25 MED ORDER — AMLODIPINE BESYLATE 10 MG PO TABS: 10.0000 mg | ORAL_TABLET | Freq: Every day | ORAL | 0 refills | Status: DC

## 2024-08-25 MED ORDER — ESCITALOPRAM OXALATE 5 MG PO TABS: 5.0000 mg | ORAL_TABLET | Freq: Every day | ORAL | 0 refills | Status: DC

## 2024-08-25 MED ORDER — INSULIN ASPART 100 UNIT/ML IJ SOLN: 15.0000 [IU] | Freq: Three times a day (TID) | INTRAMUSCULAR | 0 refills | Status: DC

## 2024-08-25 MED ORDER — INSULIN GLARGINE 100 UNIT/ML ~~LOC~~ SOLN: 25.0000 [IU] | Freq: Every day | SUBCUTANEOUS | 0 refills | Status: DC

## 2024-08-25 MED ORDER — NICOTINE POLACRILEX 2 MG MT GUM: 2.0000 mg | CHEWING_GUM | OROMUCOSAL | 0 refills | Status: DC | PRN

## 2024-08-25 NOTE — Group Note (Signed)
 Date:  08/25/2024 Time:  9:20 AM  Group Topic/Focus:  Goals Group:   The focus of this group is to help patients establish daily goals to achieve during treatment and discuss how the patient can incorporate goal setting into their daily lives to aide in recovery. Orientation:   The focus of this group is to educate the patient on the purpose and policies of crisis stabilization and provide a format to answer questions about their admission.  The group details unit policies and expectations of patients while admitted.    Participation Level:  Active  Participation Quality:  Appropriate  Affect:  Appropriate  Cognitive:  Appropriate  Insight: Appropriate  Engagement in Group:  Engaged  Modes of Intervention:  Discussion and Orientation  Additional Comments:    Adaliz Dobis D Camara Renstrom 08/25/2024, 9:20 AM

## 2024-08-25 NOTE — BHH Suicide Risk Assessment (Signed)
 [facility] Discharge Suicide Risk Assessment   Principal Problem: Adjustment disorder with depressed mood Discharge Diagnoses: Principal Problem:   Adjustment disorder with depressed mood Active Problems:   Suicidal ideation   Total Time spent with patient: 30 minutes  Musculoskeletal: Strength & Muscle Tone: within normal limits Gait & Station: normal Patient leans: N/A  Psychiatric Specialty Exam  Presentation  General Appearance: No data recorded Eye Contact:No data recorded Speech:No data recorded Speech Volume:No data recorded Handedness:No data recorded  Mood and Affect  Mood:No data recorded Duration of Depression Symptoms: Greater than two weeks  Affect:No data recorded  Thought Process  Thought Processes:No data recorded Descriptions of Associations:No data recorded Orientation:No data recorded Thought Content:No data recorded History of Schizophrenia/Schizoaffective disorder:No  Duration of Psychotic Symptoms:No data recorded Hallucinations:No data recorded Ideas of Reference:No data recorded Suicidal Thoughts:No data recorded Homicidal Thoughts:No data recorded  Sensorium  Memory:No data recorded Judgment:No data recorded Insight:No data recorded  Executive Functions  Concentration:No data recorded Attention Span:No data recorded Recall:No data recorded Fund of Knowledge:No data recorded Language:No data recorded  Psychomotor Activity  Psychomotor Activity:No data recorded  Assets  Assets:No data recorded  Sleep  Sleep:No data recorded Estimated Sleeping Duration (Last 24 Hours): 7.50-9.00 hours  Physical Exam: Physical Exam Constitutional:      Appearance: Normal appearance. He is normal weight.  HENT:     Head: Normocephalic and atraumatic.     Nose: Nose normal.  Pulmonary:     Effort: Pulmonary effort is normal.  Musculoskeletal:        General: Normal range of motion.     Cervical back: Normal range of motion.  Skin:     Findings: Lesion present.  Neurological:     General: No focal deficit present.     Mental Status: He is alert and oriented to person, place, and time. Mental status is at baseline.  Psychiatric:        Mood and Affect: Mood normal.        Behavior: Behavior normal.        Thought Content: Thought content normal.        Judgment: Judgment normal.    ROS Blood pressure (!) 157/96, pulse 97, temperature 98.2 F (36.8 C), resp. rate 18, height 5' 11 (1.803 m), weight 95.7 kg, SpO2 100%. Body mass index is 29.43 kg/m.  Mental Status Per Nursing Assessment::   On Admission:  NA  Demographic Factors:  Male, Low socioeconomic status, Living alone, Unemployed, and homeless  Loss Factors: Decrease in vocational status, Decline in physical health, and Financial problems/change in socioeconomic status  Historical Factors: NA  Risk Reduction Factors:   Sense of responsibility to family and Positive coping skills or problem solving skills  Continued Clinical Symptoms:  none  Cognitive Features That Contribute To Risk:  None    Suicide Risk:  Mild:  Suicidal ideation of limited frequency, intensity, duration, and specificity.  There are no identifiable plans, no associated intent, mild dysphoria and related symptoms, good self-control (both objective and subjective assessment), few other risk factors, and identifiable protective factors, including available and accessible social support.   Follow-up Information     Services, [facility]. Go on 08/31/2024.   Why: You have a hospital follow up appointment for therapy and medication management services on 08/31/24 at 1:00 pm .  The appointment will be held in person.  Please bring photo ID, and insurance card with you.  * Please call to cancel if you cannot attend this appointment.  Contact information: [address] [place] [address] [phone]         [facility]. Go on 08/27/2024.    Specialty: Behavioral Health Why: Please go to this provider on 08/27/24 at 7:00 am for an assessment for services, if you are living in [facility]  You may also go Monday through Friday, arrive by 7:00 am for an assessment, to obtain therapy and medication management services. Contact information: [address] [place]  [phone]                Plan Of Care/Follow-up recommendations:  Activity:  unrestricted Diet:  Diabetic Diet Tests:  daily and meal time blood glucose checks. Quarterly Hgb A1C, Kidney Function. Daily BP Other:  Follow Up with outpatient psychiatrist and primary care doctor for Diabetes, Hypertension, and right arm wound. Complete Keflex  7 day regimen on 08/27/2024.  Lamar Handler Jama Slain, DO 08/25/2024, 7:58 AM

## 2024-08-25 NOTE — BHH Suicide Risk Assessment (Signed)
 BHH INPATIENT:  Family/Significant Other Suicide Prevention Education  Suicide Prevention Education:  Education Completed; Completed with patient upon discharge. The suicide prevention education provided includes the following: Suicide risk factors Suicide prevention and interventions National Suicide Hotline telephone number [facility] assessment telephone number [facility] Emergency Assistance 911 [facility] and/or Residential Mobile Crisis Unit telephone number  Request made of family/significant other to: Remove weapons (e.g., guns, rifles, knives), all items previously/currently identified as safety concern.   Remove drugs/medications (over-the-counter, prescriptions, illicit drugs), all items previously/currently identified as a safety concern.  The patient verbalizes understanding of the suicide prevention education information provided.   [name] [name] 08/25/2024, 8:47 AM

## 2024-08-25 NOTE — Group Note (Signed)
 Date:  08/25/2024 Time:  10:54 AM  Recreational Therapy   Participation Level:  Did Not Attend    [name] [name] 08/25/2024, 10:54 AM

## 2024-08-25 NOTE — Transportation (Signed)
 08/25/2024  [name] [name] DOB: Mar 25, 1986 MRN: [id number]   RIDER WAIVER AND RELEASE OF LIABILITY  For the purposes of helping with transportation needs, [facility] partners with outside transportation providers (taxi companies, [place], catering manager.) to give [facility] patients or other approved people the choice of on-demand rides Public Librarian) to our buildings for non-emergency visits.  By using [employer], I, the person signing this document, on behalf of myself and/or any legal minors (in my care using the [employer]), agree:  Science Writer given to me are supplied by independent, outside transportation providers who do not work for, or have any affiliation with, [employer]. [place] is not a transportation company. [place] has no control over the quality or safety of the rides I get using [employer]. [place] has no control over whether any outside ride will happen on time or not. [facility] gives no guarantee on the reliability, quality, safety, or availability on any rides, or that no mistakes will happen. I know and accept that traveling by vehicle (car, truck, SVU, fleeta, bus, taxi, etc.) has risks of serious injuries such as disability, being paralyzed, and death. I know and agree the risk of using [employer] is mine alone, and not [employer]. [employer] are provided as is and as are available. The transportation providers are in charge for all inspections and care of the vehicles used to provide these rides. I agree not to take legal action against [place], its agents, employees, officers, directors, representatives, insurers, attorneys, assigns, successors, subsidiaries, and affiliates at any time for any reasons related directly or indirectly to using [employer]. I also agree not to take legal action against [place] or its affiliates for any injury, death, or damage to property caused by or related to using  [employer]. I have read this Waiver and Release of Liability, and I understand the terms used in it and their legal meaning. This Waiver is freely and voluntarily given with the understanding that my right (or any legal minors) to legal action against [facility] relating to [employer] is knowingly given up to use these services.   I attest that I read the Ride Waiver and Release of Liability to [name] [name], gave Mr. Lile the opportunity to ask questions and answered the questions asked (if any). I affirm that [name] [name] then provided consent for assistance with transportation.

## 2024-08-25 NOTE — Plan of Care (Signed)
   Problem: Education: Goal: Emotional status will improve Outcome: Progressing

## 2024-08-25 NOTE — Progress Notes (Signed)
 D: Pt A & O X 3. Denies SI, HI, AVH and pain at this time. D/C home as ordered. Picked up in front of facility by taxi cab. A: D/C instructions reviewed with pt including prescriptions, medication samples and follow up appointments; compliance encouraged. All belongings from locker 47 returned to pt at time of departure. Scheduled medications given with verbal education and effects monitored. Safety checks maintained without incident till time of d/c.  R: Pt receptive to care. Compliant with medications when offered. Denies adverse drug reactions when assessed. Verbalized understanding related to d/c instructions. Signed belonging sheet in agreement with items received from locker. Ambulatory with a steady gait. Appears to be in no physical distress at time of departure.

## 2024-08-25 NOTE — Progress Notes (Signed)
" °  [facility] Adult Case Management Discharge Plan :  Will you be returning to the same living situation after discharge:  Yes,  Pt returning to [place]. At discharge, do you have transportation home?: Yes,  Pt will have taxi voucher. Do you have the ability to pay for your medications: Yes,  Pt has medical insurance.  Release of information consent forms completed and in the chart;  Patient's signature needed at discharge.  Patient to Follow up at:  Follow-up Information     [facility], [facility]. Go on 08/31/2024.   Why: You have a hospital follow up appointment for therapy and medication management services on 08/31/24 at 1:00 pm .  The appointment will be held in person.  Please bring photo ID, and insurance card with you.  * Please call to cancel if you cannot attend this appointment. Contact information: [address] [place] [address] [phone]         [facility]. Go on 08/27/2024.   Specialty: Behavioral Health Why: Please go to this provider on 08/27/24 at 7:00 am for an assessment for services, if you are living in [facility].  You may also go Monday through Friday, arrive by 7:00 am for an assessment, to obtain therapy and medication management services. Contact information: [address] [place]  [address] [phone]                Next level of care provider has access to [facility] Link:no  Safety Planning and Suicide Prevention discussed: No. Completed with patient upon discharge.    Has patient been referred to the Quitline?: Patient refused referral for treatment  Patient has been referred for addiction treatment: Patient refused referral for treatment.  Derick JONELLE Blanch, LCSW 08/25/2024, 8:45 AM "

## 2024-08-25 NOTE — Progress Notes (Signed)
(  Sleep Hours) -9 (Any PRNs that were needed, meds refused, or side effects to meds)- Trazodone   (Any disturbances and when (visitation, over night)-none (Concerns raised by the patient)- none (SI/HI/AVH)-denied

## 2024-08-25 NOTE — Discharge Summary (Signed)
 " Physician Discharge Summary Note  Patient:  [name] [name] is an 39 y.o., male MRN:  [id number] DOB:  1985/12/23 Patient phone:  [phone] (home)  Patient address:   [address] [place] [address],  Total Time spent with patient: 30 minutes  Date of Admission:  08/21/2024 Date of Discharge: 08/25/2024  Reason for Admission:  suicidal ideation in the context of chronic homelessness and lack of social support  Principal Problem: Adjustment disorder with depressed mood Discharge Diagnoses: Principal Problem:   Adjustment disorder with depressed mood   History of Present Illness: [name] [name] is a 39 y/o male with DM1 and significant prior psychiatric history who presented to the emergency department with active suicidal ideation in the context of chronic homelessness and lack of social support. He reports that he was previously living in [place], [place] with his wife and six children until about two years ago when he left home to stay with his grandmother in [place], [place]. He is vague on the exact details of what circumstances led to this, but hints at marital stressors related to a history of substance abuse. He reports that he was initially living with his grandmother until his mother (who was living in [place]) came down to stay with the two of them, and this ultimately led to him being forced out of the home. He describes a strained relationship with his mother, and states that his grandmother is in poor health and has symptoms of dementia. He reports that for approximately the past year he has been homeless in [place], and has been staying at local shelters and with friends. He has experienced numerous ED and hospital visits over the past year for complications related to his poorly controlled diabetes, and states that he has not seen any providers locally for outpatient management of this. He reports that while he has received prescriptions for insulin  intermittently there has been no  substantive follow-up or management. He most recently presented to the ED on 1/20 for hyperglycemia and flank pain and was stabilized and discharged. Per chart review, he appeared to have left and returned to the ED the following day, with documented encounters on 1/21 and 1/22. On that encounter he was documented to endorse suicidal ideation but was cleared for discharge, however, he returned the following day reporting ongoing suicidal ideation with thoughts of overdosing on pain pills or not taking his insulin  and was ultimately admitted voluntarily to inpatient psychiatry.    In reviewing current symptoms he endorses a depressed mood without anhedonia, hopelessness, significant guilt, appetite disturbance, or concentration difficulties. He endorses anxiety related to his social situation but otherwise does not endorse symptoms of GAD or panic disorder. At the time of assessment he denied active SI. He shared that he had recently been in contact with his wife and is hopeful to return to [place] to reunite with his family.       Past Psychiatric History: The patient denies any significant psychiatric history, to include suicide attempts, prescribed medications, or outpatient treatment.   Social History:  Married, but geographically separated from wife, who lives in [place], [place]. He reports having six children, ages 2-21. 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 He denies any current legal issues. He does not have any current form  of income. He is currently homeless, but has been staying in local shelters and with friends who live locally.   Substance use Smoke about 1/2 ppd. Denies regular marijuana  use. 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 He reports drinking alcohol about 3 times per week, and up to 1/2 gallon of liquor at a time. He denies any hx of DUI or alcohol withdrawal. No hx of  substance use treatment.     Past Medical History:  Past Medical History:  Diagnosis Date   Diabetes mellitus without complication (HCC)    DKA (diabetic ketoacidosis) (HCC) 05/08/2022   HTN (hypertension) 05/08/2022   Hyperlipidemia    Hyperosmolar hyperglycemic state (HHS) (HCC) 05/08/2022   Hypertension    Hypoglycemia 07/02/2019   Pseudohyponatremia 05/08/2022    Past Surgical History:  Procedure Laterality Date   HAND SURGERY     Family History:  Family History  Family history unknown: Yes    Social History:  Social History   Substance and Sexual Activity  Alcohol Use Never     Social History   Substance and Sexual Activity  Drug Use Yes   Types: Amphetamines    Social History   Socioeconomic History   Marital status: Divorced    Spouse name: Not on file   Number of children: Not on file   Years of education: Not on file   Highest education level: Not on file  Occupational History   Not on file  Tobacco Use   Smoking status: Every Day    Types: Cigarettes   Smokeless tobacco: Never  Vaping Use   Vaping status: Unknown  Substance and Sexual Activity   Alcohol use: Never   Drug use: Yes    Types: Amphetamines   Sexual activity: Yes    Birth control/protection: Condom  Other Topics Concern   Not on file  Social History Narrative   ** Merged History Encounter **       ** Merged History Encounter **       ** Merged History Encounter **       ** Merged History Encounter **       Social Drivers of Health   Tobacco Use: High Risk (08/21/2024)   Patient History    Smoking Tobacco Use: Every Day    Smokeless Tobacco Use: Never    Passive Exposure: Not on file  Financial Resource Strain: Not on file  Food Insecurity: Food Insecurity Present (08/21/2024)   Epic    Worried About Programme Researcher, Broadcasting/film/video in the Last Year: Often true    Barista in the Last Year: Often true  Transportation Needs: Unmet Transportation Needs (08/21/2024)   Epic     Lack of Transportation (Medical): Yes    Lack of Transportation (Non-Medical): Yes  Physical Activity: Not on file  Stress: Not on file  Social Connections: Not on file  Depression (PHQ2-9): High Risk (08/20/2024)   Depression (PHQ2-9)    PHQ-2 Score: 14  Alcohol Screen: Low Risk (08/20/2024)   Alcohol Screen    Last Alcohol Screening Score (AUDIT): 2  Housing: High Risk (08/21/2024)   Epic    Unable to Pay for Housing in the Last Year: No    Number of Times Moved in the Last Year: 0    Homeless in the Last Year: Yes  Utilities: Not At Risk (08/21/2024)   Epic    Threatened with loss of utilities: No  Health Literacy: Not on file    Hospital Course:    During the patient's hospitalization, patient had extensive initial psychiatric evaluation, and follow-up psychiatric evaluations every day.  During the hospitalization, other adjustments were made to the patient's psychiatric medication regimen:   # Adjustment disorder with depressed mood - Continue Lexapro  5 mg   # Soft Tissue Infection - Cephalexin  500 mg q12hr for 7 days, started 1/24   # DM1 - Insulin  aspart sliding scale - regular - COntinue insulin  Novolog  15 units daily with meals - Insulin  Lantus  25 units hs   # HTN - Lisinopril  5 mg daily - Amlodipine  10 mg daily - Hydralazine  25 mg q8h prn BP > 140/90  Patient's care was discussed during the interdisciplinary team meeting every day during the hospitalization.  The patient denied having side effects to prescribed psychiatric medication.  Gradually, patient started adjusting to milieu. The patient was evaluated each day by a clinical provider to ascertain response to treatment. Improvement was noted by the patient's report of decreasing symptoms, improved sleep and appetite, affect, medication tolerance, behavior, and participation in unit programming.  Patient was asked each day to complete a self inventory noting mood, mental status, pain, new symptoms,  anxiety and concerns.   Symptoms were reported as significantly decreased or resolved completely by discharge.  The patient reports that their mood is stable.  The patient denied having suicidal thoughts for more than 48 hours prior to discharge.  Patient denies having homicidal thoughts.  Patient denies having auditory hallucinations.  Patient denies any visual hallucinations or other symptoms of psychosis.  The patient was motivated to continue taking medication with a goal of continued improvement in mental health.   The patient reports their target psychiatric symptoms of SI and depressed mood responded well to the psychiatric medications, and the patient reports overall benefit other psychiatric hospitalization. Supportive psychotherapy was provided to the patient. The patient also participated in regular group therapy while hospitalized. Coping skills, problem solving as well as relaxation therapies were also part of the unit programming.  Labs were reviewed with the patient, and abnormal results were discussed with the patient.  The patient is able to verbalize their individual safety plan to this provider.  # It is recommended to the patient to continue psychiatric medications as prescribed, after discharge from the hospital.    # It is recommended to the patient to follow up with your outpatient psychiatric provider and PCP.  # It was discussed with the patient, the impact of alcohol, drugs, tobacco have been there overall psychiatric and medical wellbeing, and total abstinence from substance use was recommended the patient.ed.  # Prescriptions provided or sent directly to preferred pharmacy at discharge. Patient agreeable to plan. Given opportunity to ask questions. Appears to feel comfortable with discharge.    # In the event of worsening symptoms, the patient is instructed to call the crisis hotline, 911 and or go to the nearest ED for appropriate evaluation and treatment of symptoms.  To follow-up with primary care provider for other medical issues, concerns and or health care needs  # Patient was discharged [facility] shelter with a plan to follow up as noted below.    On day of discharge the patient was calm, friendly and interacts well with me.  Denied SI, HI and AVH.  Sleep and appetite are both stable.  The patient was tolerating his medication regimen well with no observed reported side effects.  We discussed his medication regimen to include his blood pressure medicines, Lexapro , insulin  and Keflex .  He was instructed to take Keflex  daily till January 30 when the regimen will be complete.  We discussed  his insulin  regimen and the patient will take 25 units of Lantus  in the evening, 15 units of NovoLog  before meals.  The patient was recommended to follow-up with his primary care doctor to get quarterly hemoglobin A1c checks as well as kidney function.  Also instructed him on care of his wound and recommended close follow-up for his wound.  The patient is able to provide a safety plan to include using the coping skills he learned on the unit while attending group therapy and calling 911/going to the urgent care center if suicidal thoughts return or worse. The patient did not want to remain on the unit any longer despite blood sugars remaining high.  I discussed risks of DKA and death.  The patient understands these and elected to be discharged today.  I also discussed risks of his wound worsening and he again would rather discharge.  The patient states that he has used the [facility] in the past and knows how to navigate the resources there. The patient will be discharged in stable condition.   Physical Findings: AIMS:  , ,  ,  ,  ,  ,   CIWA:    COWS:     Musculoskeletal: Strength & Muscle Tone: within normal limits Gait & Station: normal Patient leans: N/A   Psychiatric Specialty Exam:  Presentation  General Appearance: Appropriate for Environment  Eye  Contact:Good  Speech:Clear and Coherent  Speech Volume:Normal  Handedness:No data recorded  Mood and Affect  Mood:Euthymic  Affect:Appropriate; Congruent   Thought Process  Thought Processes:Coherent  Descriptions of Associations:Intact  Orientation:Full (Time, Place and Person)  Thought Content:Logical  History of Schizophrenia/Schizoaffective disorder:No  Duration of Psychotic Symptoms:No data recorded Hallucinations:Hallucinations: None  Ideas of Reference:None  Suicidal Thoughts:Suicidal Thoughts: No  Homicidal Thoughts:Homicidal Thoughts: No   Sensorium  Memory:Immediate Good; Recent Good; Remote Good  Judgment:Good  Insight:Good   Executive Functions  Concentration:Good  Attention Span:Good  Recall:Good  Fund of Knowledge:Good  Language:Good   Psychomotor Activity  Psychomotor Activity:Psychomotor Activity: Normal   Assets  Assets:Communication Skills; Desire for Improvement; Resilience   Sleep  Sleep:Sleep: Good  Estimated Sleeping Duration (Last 24 Hours): 7.50-9.00 hours   Physical Exam: Physical Exam Constitutional:      Appearance: Normal appearance. He is normal weight.  HENT:     Head: Normocephalic and atraumatic.     Nose: Nose normal.  Pulmonary:     Effort: Pulmonary effort is normal.  Musculoskeletal:        General: Normal range of motion.  Neurological:     General: No focal deficit present.     Mental Status: He is alert and oriented to person, place, and time. Mental status is at baseline.  Psychiatric:        Mood and Affect: Mood normal.        Behavior: Behavior normal.        Thought Content: Thought content normal.        Judgment: Judgment normal.    ROS Blood pressure (!) 142/88, pulse 98, temperature 98.2 F (36.8 C), resp. rate 18, height 5' 11 (1.803 m), weight 95.7 kg, SpO2 100%. Body mass index is 29.43 kg/m.   Tobacco Use History[1] Tobacco Cessation:  A prescription for an  FDA-approved tobacco cessation medication provided at discharge   Blood Alcohol level:  Lab Results  Component Value Date   [facility] <15 08/20/2024   [facility] <15 08/18/2024    Metabolic Disorder Labs:  Lab Results  Component Value Date  HGBA1C >15.5 (H) 02/28/2024   MPG >398 02/28/2024   MPG 323.53 11/29/2023   No results found for: PROLACTIN No results found for: CHOL, TRIG, HDL, CHOLHDL, VLDL, LDLCALC  See Psychiatric Specialty Exam and Suicide Risk Assessment completed by Attending Physician prior to discharge.  Discharge destination:  Other:  IRC  Is patient on multiple antipsychotic therapies at discharge:  No   Has Patient had three or more failed trials of antipsychotic monotherapy by history:  No  Recommended Plan for Multiple Antipsychotic Therapies: NA  Discharge Instructions     Call MD for:  difficulty breathing, headache or visual disturbances   Complete by: As directed    Call MD for:  extreme fatigue   Complete by: As directed    Call MD for:  persistant nausea and vomiting   Complete by: As directed    Call MD for:  redness, tenderness, or signs of infection (pain, swelling, redness, odor or green/yellow discharge around incision site)   Complete by: As directed    Call MD for:  severe uncontrolled pain   Complete by: As directed    Call MD for:  temperature >100.4   Complete by: As directed    Change dressing (specify)   Complete by: As directed    Cleanse wound with saline, pat dry Pack area with 1/4 iodoform gauze,  top with small foam. Change daily   Diet - low sodium heart healthy   Complete by: As directed    Diet Carb Modified   Complete by: As directed    Discharge wound care:   Complete by: As directed    Cleanse wound with saline, pat dry Pack area with 1/4 iodoform gauze, top with small foam. Change daily   Increase activity slowly   Complete by: As directed       Allergies as of 08/25/2024   No Known Allergies       Medication List     STOP taking these medications    Insulin  Aspart FlexPen 100 UNIT/ML Commonly known as: NOVOLOG  Replaced by: insulin  aspart 100 UNIT/ML injection   insulin  lispro 100 UNIT/ML KwikPen Commonly known as: HumaLOG  KwikPen Replaced by: insulin  aspart 100 UNIT/ML injection   Lantus  SoloStar 100 UNIT/ML Solostar Pen Generic drug: insulin  glargine Replaced by: insulin  glargine 100 UNIT/ML injection       TAKE these medications      Indication  amLODipine  10 MG tablet Commonly known as: NORVASC  Take 1 tablet (10 mg total) by mouth daily. What changed: Another medication with the same name was removed. Continue taking this medication, and follow the directions you see here.  Indication: High Blood Pressure   escitalopram  5 MG tablet Commonly known as: LEXAPRO  Take 1 tablet (5 mg total) by mouth daily.  Indication: Major Depressive Disorder   hydrALAZINE  25 MG tablet Commonly known as: APRESOLINE  Take 1 tablet (25 mg total) by mouth every 8 (eight) hours.  Indication: High Blood Pressure   insulin  aspart 100 UNIT/ML injection Commonly known as: novoLOG  Inject 15 Units into the skin with breakfast, with lunch, and with evening meal. Replaces: Insulin  Aspart FlexPen 100 UNIT/ML  Indication: Type 1 Diabetes   insulin  glargine 100 UNIT/ML injection Commonly known as: LANTUS  Inject 0.25 mLs (25 Units total) into the skin at bedtime. Replaces: Lantus  SoloStar 100 UNIT/ML Solostar Pen  Indication: Type 1 Diabetes   Lancet Device Misc Use 3 (three) times daily.  Indication: Diabetes   lisinopril  5 MG tablet Commonly known as: ZESTRIL   Take 1 tablet (5 mg total) by mouth daily.  Indication: High Blood Pressure   nicotine  polacrilex 2 MG gum Commonly known as: NICORETTE  Take 1 each (2 mg total) by mouth as needed for smoking cessation.  Indication: Nicotine  Addiction        Follow-up Information     [facility], [facility]. Go on 08/31/2024.    Why: You have a hospital follow up appointment for therapy and medication management services on 08/31/24 at 1:00 pm .  The appointment will be held in person.  Please bring photo ID, and insurance card with you.  * Please call to cancel if you cannot attend this appointment. Contact information: [address] [place] [address] [phone]         [facility]. Go on 08/27/2024.   Specialty: Behavioral Health Why: Please go to this provider on 08/27/24 at 7:00 am for an assessment for services, if you are living in [facility].  You may also go Monday through Friday, arrive by 7:00 am for an assessment, to obtain therapy and medication management services. Contact information: [address] [place]  [id number] [phone]                Follow-up recommendations:     Activity:  unrestricted Diet:  Diabetic Diet Tests:  daily and meal time blood glucose checks. Quarterly Hgb A1C, Kidney Function. Daily BP Other:  Follow Up with outpatient psychiatrist and primary care doctor for Diabetes, Hypertension, and right arm wound. Complete Keflex  7 day regimen on 08/27/2024.   Signed: Lamar Sherlean Jama Chandra, DO 08/25/2024, 12:52 PM           [1]  Social History Tobacco Use  Smoking Status Every Day   Types: Cigarettes  Smokeless Tobacco Never   "

## 2024-08-27 ENCOUNTER — Telehealth: Payer: Self-pay | Admitting: *Deleted

## 2024-08-27 NOTE — Progress Notes (Unsigned)
 Complex Care Management Note Care Guide Note  08/27/2024 Name: COREY LASKI MRN: [id number] DOB: June 22, 1986   Complex Care Management Outreach Attempts: An unsuccessful telephone outreach was attempted today to offer the patient information about available complex care management services.  Follow Up Plan:  Additional outreach attempts will be made to offer the patient complex care management information and services.   Encounter Outcome:  No Answer  Harlene Satterfield  [facility]  [facility], [facility] Guide  Direct Dial : [phone]  Fax [phone]

## 2024-08-30 NOTE — Progress Notes (Unsigned)
 Complex Care Management Note Care Guide Note  08/30/2024 Name: [name] [name] MRN: [id number] DOB: 01-24-1986   Complex Care Management Outreach Attempts: A second unsuccessful outreach was attempted today to offer the patient with information about available complex care management services.  Follow Up Plan:  Additional outreach attempts will be made to offer the patient complex care management information and services.   Encounter Outcome:  No Answer  Harlene Satterfield  [facility]  [facility], [facility] Guide  Direct Dial : [phone]  Fax [phone]

## 2024-08-31 NOTE — Progress Notes (Signed)
 Complex Care Management Note Care Guide Note  08/31/2024 Name: JOSHVA LABRECK MRN: [id number] DOB: 1985-08-19   Complex Care Management Outreach Attempts: A third unsuccessful outreach was attempted today to offer the patient with information about available complex care management services.  Follow Up Plan:  No further outreach attempts will be made at this time. We have been unable to contact the patient to offer or enroll patient in complex care management services.  Encounter Outcome:  No Answer  Harlene Satterfield  [facility]  [facility], [facility] Guide  Direct Dial : [phone]  Fax [phone]

## 2024-09-01 ENCOUNTER — Emergency Department (HOSPITAL_COMMUNITY)

## 2024-09-01 ENCOUNTER — Other Ambulatory Visit: Payer: Self-pay

## 2024-09-01 ENCOUNTER — Observation Stay (HOSPITAL_COMMUNITY)
Admission: EM | Admit: 2024-09-01 | Discharge: 2024-09-03 | Disposition: A | Source: Home / Self Care | Attending: Emergency Medicine | Admitting: Emergency Medicine

## 2024-09-01 ENCOUNTER — Encounter (HOSPITAL_COMMUNITY): Payer: Self-pay

## 2024-09-01 ENCOUNTER — Observation Stay (HOSPITAL_COMMUNITY)

## 2024-09-01 DIAGNOSIS — N179 Acute kidney failure, unspecified: Secondary | ICD-10-CM

## 2024-09-01 DIAGNOSIS — E1100 Type 2 diabetes mellitus with hyperosmolarity without nonketotic hyperglycemic-hyperosmolar coma (NKHHC): Secondary | ICD-10-CM

## 2024-09-01 DIAGNOSIS — E860 Dehydration: Secondary | ICD-10-CM

## 2024-09-01 DIAGNOSIS — R55 Syncope and collapse: Secondary | ICD-10-CM

## 2024-09-01 DIAGNOSIS — E1101 Type 2 diabetes mellitus with hyperosmolarity with coma: Secondary | ICD-10-CM | POA: Diagnosis present

## 2024-09-01 DIAGNOSIS — R739 Hyperglycemia, unspecified: Principal | ICD-10-CM

## 2024-09-01 DIAGNOSIS — E11649 Type 2 diabetes mellitus with hypoglycemia without coma: Secondary | ICD-10-CM

## 2024-09-01 LAB — BASIC METABOLIC PANEL WITH GFR
Anion gap: 12 (ref 5–15)
Anion gap: 10 (ref 5–15)
Anion gap: 13 (ref 5–15)
BUN: 17 mg/dL (ref 6–20)
BUN: 15 mg/dL (ref 6–20)
BUN: 14 mg/dL (ref 6–20)
CO2: 23 mmol/L (ref 22–32)
CO2: 24 mmol/L (ref 22–32)
CO2: 17 mmol/L — ABNORMAL LOW (ref 22–32)
Calcium: 8.6 mg/dL — ABNORMAL LOW (ref 8.9–10.3)
Calcium: 9.2 mg/dL (ref 8.9–10.3)
Calcium: 8.8 mg/dL — ABNORMAL LOW (ref 8.9–10.3)
Chloride: 91 mmol/L — ABNORMAL LOW (ref 98–111)
Chloride: 98 mmol/L (ref 98–111)
Chloride: 101 mmol/L (ref 98–111)
Creatinine, Ser: 1.38 mg/dL — ABNORMAL HIGH (ref 0.61–1.24)
Creatinine, Ser: 1.26 mg/dL — ABNORMAL HIGH (ref 0.61–1.24)
Creatinine, Ser: 1.09 mg/dL (ref 0.61–1.24)
GFR, Estimated: >60 mL/min
GFR, Estimated: >60 mL/min
GFR, Estimated: >60 mL/min
Glucose, Bld: 754 mg/dL (ref 70–99)
Glucose, Bld: 459 mg/dL — ABNORMAL HIGH (ref 70–99)
Glucose, Bld: 272 mg/dL — ABNORMAL HIGH (ref 70–99)
Potassium: 5 mmol/L (ref 3.5–5.1)
Potassium: 4.5 mmol/L (ref 3.5–5.1)
Potassium: 4.2 mmol/L (ref 3.5–5.1)
Sodium: 125 mmol/L — ABNORMAL LOW (ref 135–145)
Sodium: 131 mmol/L — ABNORMAL LOW (ref 135–145)
Sodium: 132 mmol/L — ABNORMAL LOW (ref 135–145)

## 2024-09-01 LAB — CBG MONITORING, ED
Glucose-Capillary: >600 mg/dL (ref 70–99)
Glucose-Capillary: 491 mg/dL — ABNORMAL HIGH (ref 70–99)
Glucose-Capillary: 314 mg/dL — ABNORMAL HIGH (ref 70–99)
Glucose-Capillary: 429 mg/dL — ABNORMAL HIGH (ref 70–99)
Glucose-Capillary: 90 mg/dL (ref 70–99)
Glucose-Capillary: 111 mg/dL — ABNORMAL HIGH (ref 70–99)
Glucose-Capillary: 61 mg/dL — ABNORMAL LOW (ref 70–99)
Glucose-Capillary: 217 mg/dL — ABNORMAL HIGH (ref 70–99)
Glucose-Capillary: 217 mg/dL — ABNORMAL HIGH (ref 70–99)
Glucose-Capillary: 135 mg/dL — ABNORMAL HIGH (ref 70–99)
Glucose-Capillary: 141 mg/dL — ABNORMAL HIGH (ref 70–99)
Glucose-Capillary: 162 mg/dL — ABNORMAL HIGH (ref 70–99)

## 2024-09-01 LAB — CBC
HCT: 42.2 % (ref 39.0–52.0)
Hemoglobin: 13.8 g/dL (ref 13.0–17.0)
MCH: 28 pg (ref 26.0–34.0)
MCHC: 32.7 g/dL (ref 30.0–36.0)
MCV: 85.6 fL (ref 80.0–100.0)
Platelets: 271 10*3/uL (ref 150–400)
RBC: 4.93 MIL/uL (ref 4.22–5.81)
RDW: 12 % (ref 11.5–15.5)
WBC: 5.4 10*3/uL (ref 4.0–10.5)
nRBC: 0 % (ref 0.0–0.2)

## 2024-09-01 LAB — GLUCOSE, CAPILLARY
Glucose-Capillary: 152 mg/dL — ABNORMAL HIGH (ref 70–99)
Glucose-Capillary: 195 mg/dL — ABNORMAL HIGH (ref 70–99)
Glucose-Capillary: 231 mg/dL — ABNORMAL HIGH (ref 70–99)

## 2024-09-01 LAB — CBC WITH DIFFERENTIAL/PLATELET
Abs Immature Granulocytes: 0.01 10*3/uL (ref 0.00–0.07)
Basophils Absolute: 0.1 10*3/uL (ref 0.0–0.1)
Basophils Relative: 1 %
Eosinophils Absolute: 0.1 10*3/uL (ref 0.0–0.5)
Eosinophils Relative: 2 %
HCT: 42.1 % (ref 39.0–52.0)
Hemoglobin: 14.1 g/dL (ref 13.0–17.0)
Immature Granulocytes: 0 %
Lymphocytes Relative: 30 %
Lymphs Abs: 1.5 10*3/uL (ref 0.7–4.0)
MCH: 28.6 pg (ref 26.0–34.0)
MCHC: 33.5 g/dL (ref 30.0–36.0)
MCV: 85.4 fL (ref 80.0–100.0)
Monocytes Absolute: 0.4 10*3/uL (ref 0.1–1.0)
Monocytes Relative: 9 %
Neutro Abs: 3 10*3/uL (ref 1.7–7.7)
Neutrophils Relative %: 58 %
Platelets: 266 10*3/uL (ref 150–400)
RBC: 4.93 MIL/uL (ref 4.22–5.81)
RDW: 12 % (ref 11.5–15.5)
WBC: 5.2 10*3/uL (ref 4.0–10.5)
nRBC: 0 % (ref 0.0–0.2)

## 2024-09-01 LAB — BLOOD GAS, VENOUS
Acid-Base Excess: 1.5 mmol/L (ref 0.0–2.0)
Bicarbonate: 26.6 mmol/L (ref 20.0–28.0)
O2 Saturation: 96.7 %
Patient temperature: 37
pCO2, Ven: 43 mmHg — ABNORMAL LOW (ref 44–60)
pH, Ven: 7.4 (ref 7.25–7.43)
pO2, Ven: 76 mmHg — ABNORMAL HIGH (ref 32–45)

## 2024-09-01 LAB — ECHOCARDIOGRAM COMPLETE
AR max vel: 3.25 cm2
AV Area VTI: 3.53 cm2
AV Area mean vel: 3.15 cm2
AV Mean grad: 3 mmHg
AV Peak grad: 5.9 mmHg
Ao pk vel: 1.21 m/s
Area-P 1/2: 3.93 cm2
Calc EF: 64.7 %
Height: 71 in
MV VTI: 3.34 cm2
S' Lateral: 3.6 cm
Single Plane A2C EF: 68.9 %
Single Plane A4C EF: 60.2 %
Weight: 3519.95 [oz_av]

## 2024-09-01 LAB — URINE DRUG SCREEN
Amphetamines: NEGATIVE
Barbiturates: NEGATIVE
Benzodiazepines: NEGATIVE
Cocaine: NEGATIVE
Fentanyl: NEGATIVE
Methadone Scn, Ur: NEGATIVE
Opiates: NEGATIVE
Tetrahydrocannabinol: NEGATIVE

## 2024-09-01 LAB — TROPONIN T, HIGH SENSITIVITY
Troponin T High Sensitivity: 16 ng/L (ref 0–19)
Troponin T High Sensitivity: 16 ng/L (ref 0–19)

## 2024-09-01 LAB — URINALYSIS, ROUTINE W REFLEX MICROSCOPIC
Bacteria, UA: NONE SEEN
Bilirubin Urine: NEGATIVE
Glucose, UA: >=500 mg/dL — AB
Hgb urine dipstick: NEGATIVE
Ketones, ur: NEGATIVE mg/dL
Leukocytes,Ua: NEGATIVE
Nitrite: NEGATIVE
Protein, ur: NEGATIVE mg/dL
Specific Gravity, Urine: 1.023 (ref 1.005–1.030)
pH: 7 (ref 5.0–8.0)

## 2024-09-01 LAB — HEMOGLOBIN A1C
Hgb A1c MFr Bld: 13.7 % — ABNORMAL HIGH (ref 4.8–5.6)
Mean Plasma Glucose: 346.49 mg/dL

## 2024-09-01 LAB — BETA-HYDROXYBUTYRIC ACID: Beta-Hydroxybutyric Acid: 0.13 mmol/L (ref 0.05–0.27)

## 2024-09-01 LAB — HIV ANTIBODY (ROUTINE TESTING W REFLEX): HIV Screen 4th Generation wRfx: NONREACTIVE

## 2024-09-01 LAB — OSMOLALITY
Osmolality: 313 mosm/kg — ABNORMAL HIGH (ref 275–295)
Osmolality: 303 mosm/kg — ABNORMAL HIGH (ref 275–295)

## 2024-09-01 LAB — ETHANOL: Alcohol, Ethyl (B): <15 mg/dL

## 2024-09-01 LAB — MAGNESIUM: Magnesium: 2.2 mg/dL (ref 1.7–2.4)

## 2024-09-01 MED ORDER — DEXTROSE IN LACTATED RINGERS 5 % IV SOLN: INTRAVENOUS | Status: DC

## 2024-09-01 MED ORDER — DEXTROSE 50 % IV SOLN
0.0000 mL | INTRAVENOUS | Status: DC | PRN
  Administered 2024-09-01: 50 mL via INTRAVENOUS
  Filled 2024-09-01: qty 50

## 2024-09-01 MED ORDER — ONDANSETRON HCL 4 MG/2ML IJ SOLN
4.0000 mg | Freq: Four times a day (QID) | INTRAMUSCULAR | Status: DC | PRN
  Filled 2024-09-01: qty 2

## 2024-09-01 MED ORDER — INSULIN GLARGINE-YFGN 100 UNIT/ML ~~LOC~~ SOLN
20.0000 [IU] | SUBCUTANEOUS | Status: DC
  Administered 2024-09-01: 20 [IU] via SUBCUTANEOUS
  Filled 2024-09-01 (×2): qty 0.2

## 2024-09-01 MED ORDER — ONDANSETRON HCL 4 MG PO TABS
4.0000 mg | ORAL_TABLET | Freq: Four times a day (QID) | ORAL | Status: DC | PRN
  Filled 2024-09-01: qty 1

## 2024-09-01 MED ORDER — LACTATED RINGERS IV BOLUS
2000.0000 mL | Freq: Once | INTRAVENOUS | Status: AC
  Administered 2024-09-01: 2000 mL via INTRAVENOUS

## 2024-09-01 MED ORDER — HYDRALAZINE HCL 25 MG PO TABS
25.0000 mg | ORAL_TABLET | Freq: Three times a day (TID) | ORAL | Status: DC
  Administered 2024-09-01 – 2024-09-03 (×6): 25 mg via ORAL
  Filled 2024-09-01 (×7): qty 1

## 2024-09-01 MED ORDER — ACETAMINOPHEN 325 MG PO TABS
650.0000 mg | ORAL_TABLET | Freq: Four times a day (QID) | ORAL | Status: DC | PRN
  Administered 2024-09-01 – 2024-09-03 (×5): 650 mg via ORAL
  Filled 2024-09-01 (×5): qty 2

## 2024-09-01 MED ORDER — AMLODIPINE BESYLATE 10 MG PO TABS
10.0000 mg | ORAL_TABLET | Freq: Every day | ORAL | Status: DC
  Administered 2024-09-01 – 2024-09-03 (×3): 10 mg via ORAL
  Filled 2024-09-01 (×2): qty 1
  Filled 2024-09-01: qty 2

## 2024-09-01 MED ORDER — NICOTINE 14 MG/24HR TD PT24
14.0000 mg | MEDICATED_PATCH | Freq: Every day | TRANSDERMAL | Status: DC
  Filled 2024-09-01 (×2): qty 1

## 2024-09-01 MED ORDER — ENOXAPARIN SODIUM 40 MG/0.4ML IJ SOSY
40.0000 mg | PREFILLED_SYRINGE | INTRAMUSCULAR | Status: DC
  Filled 2024-09-01 (×3): qty 0.4

## 2024-09-01 MED ORDER — INSULIN GLARGINE-YFGN 100 UNIT/ML ~~LOC~~ SOLN: 20.0000 [IU] | Freq: Every day | SUBCUTANEOUS | Status: DC

## 2024-09-01 MED ORDER — HYDRALAZINE HCL 20 MG/ML IJ SOLN: 10.0000 mg | Freq: Four times a day (QID) | INTRAMUSCULAR | Status: DC | PRN

## 2024-09-01 MED ORDER — INSULIN ASPART 100 UNIT/ML IJ SOLN
10.0000 [IU] | Freq: Once | INTRAMUSCULAR | Status: AC
  Administered 2024-09-01: 10 [IU] via SUBCUTANEOUS
  Filled 2024-09-01: qty 10

## 2024-09-01 MED ORDER — ACETAMINOPHEN 650 MG RE SUPP: 650.0000 mg | Freq: Four times a day (QID) | RECTAL | Status: DC | PRN

## 2024-09-01 MED ORDER — LACTATED RINGERS IV SOLN: INTRAVENOUS | Status: DC

## 2024-09-01 MED ORDER — ALBUTEROL SULFATE (2.5 MG/3ML) 0.083% IN NEBU: 2.5000 mg | INHALATION_SOLUTION | RESPIRATORY_TRACT | Status: DC | PRN

## 2024-09-01 MED ORDER — INSULIN ASPART 100 UNIT/ML IJ SOLN
0.0000 [IU] | Freq: Three times a day (TID) | INTRAMUSCULAR | Status: DC
  Administered 2024-09-02: 9 [IU] via SUBCUTANEOUS
  Administered 2024-09-02 (×2): 3 [IU] via SUBCUTANEOUS
  Administered 2024-09-03: 9 [IU] via SUBCUTANEOUS
  Administered 2024-09-03: 5 [IU] via SUBCUTANEOUS
  Filled 2024-09-01: qty 2
  Filled 2024-09-01: qty 8
  Filled 2024-09-01: qty 3
  Filled 2024-09-01: qty 9
  Filled 2024-09-01: qty 8
  Filled 2024-09-01: qty 3

## 2024-09-01 MED ORDER — ESCITALOPRAM OXALATE 10 MG PO TABS
5.0000 mg | ORAL_TABLET | Freq: Every day | ORAL | Status: DC
  Administered 2024-09-03: 5 mg via ORAL
  Filled 2024-09-01 (×3): qty 1

## 2024-09-01 MED ORDER — POLYETHYLENE GLYCOL 3350 17 G PO PACK: 17.0000 g | PACK | Freq: Every day | ORAL | Status: DC | PRN

## 2024-09-01 MED ORDER — INSULIN REGULAR(HUMAN) IN NACL 100-0.9 UT/100ML-% IV SOLN
INTRAVENOUS | Status: DC
  Administered 2024-09-01: 15 [IU]/h via INTRAVENOUS
  Filled 2024-09-01: qty 100

## 2024-09-01 MED ORDER — LACTATED RINGERS IV BOLUS
1000.0000 mL | Freq: Once | INTRAVENOUS | Status: AC
  Administered 2024-09-01: 1000 mL via INTRAVENOUS

## 2024-09-01 MED ORDER — OXYCODONE HCL 5 MG PO TABS
5.0000 mg | ORAL_TABLET | ORAL | Status: DC | PRN
  Filled 2024-09-01: qty 1

## 2024-09-01 NOTE — Progress Notes (Signed)
 Brief Note: CBG's have Will transition off Insuling gtt- start Lantus  20 units with SSI Will downgrade to med surg.

## 2024-09-01 NOTE — ED Notes (Signed)
 RN stopped dripped at 1700

## 2024-09-01 NOTE — ED Triage Notes (Signed)
 BIBA from [place] [employer] [place] w/ c/o high blood sugar.  PT noncompliant with medications.  Currently without a home.  FSBS last 498. Glucometer during triage process reading high

## 2024-09-01 NOTE — ED Notes (Signed)
 Wrote Dr. Melvenia via secure chat for order clarification.  Patient has received a total of 3 liters of LR boluses and this RN observed another 1 liter of LR pending for administration.  Patient's blood glucose now 491, awaiting clarification of this order by MD

## 2024-09-01 NOTE — ED Provider Notes (Signed)
 " [place] EMERGENCY DEPARTMENT AT [facility] Provider Note   CSN: [id number] Arrival date & time: 09/01/24  9756     Patient presents with: Hyperglycemia (BIBA from [place] [employer] w/ c/o high blood sugar.  PT noncompliant with medications.  Currently without a home.  FSBS last 498)   [name] [name] is a 39 y.o. male.   HPI Patient presents for syncope.  Medical history includes DM, HTN, HLD, substance abuse, depression.  He was recently admitted to [facility] for depression.  He was discharged a week ago.  He states that he has not taken any insulin  in the last 5 days.  Earlier in the evening, he felt woozy.  While in [place], he had a syncopal episode.  EMS noted hyperglycemia and hypertension prior to arrival.  Patient also reports nonadherence to his blood pressure medications.  Currently, he endorses fatigue and thirst.  He also notices pain in 1st webspace of right hand.    Prior to Admission medications  Medication Sig Start Date End Date Taking? Authorizing Provider  amLODipine  (NORVASC ) 10 MG tablet Take 1 tablet (10 mg total) by mouth daily. 08/25/24   Chandra Lamar Sherlean Jama, DO  escitalopram  (LEXAPRO ) 5 MG tablet Take 1 tablet (5 mg total) by mouth daily. 08/25/24   Chandra Lamar Sherlean Jama, DO  hydrALAZINE  (APRESOLINE ) 25 MG tablet Take 1 tablet (25 mg total) by mouth every 8 (eight) hours. Patient not taking: Reported on 05/24/2024 04/05/24 05/24/24  Leotis Bogus, MD  insulin  aspart (NOVOLOG ) 100 UNIT/ML injection Inject 15 Units into the skin with breakfast, with lunch, and with evening meal. 08/25/24   Chandra Lamar Sherlean Jama, DO  insulin  glargine (LANTUS ) 100 UNIT/ML injection Inject 0.25 mLs (25 Units total) into the skin at bedtime. 08/25/24   Chandra Lamar Sherlean Jama, DO  Lancet Device MISC Use 3 (three) times daily. 05/05/24   Vernon Ranks, MD  lisinopril  (ZESTRIL ) 5 MG tablet Take 1 tablet (5 mg total) by mouth daily. 08/25/24   Chandra Lamar Sherlean Jama, DO  nicotine  polacrilex (NICORETTE ) 2 MG gum Take 1 each (2 mg total) by mouth as needed for smoking cessation. 08/25/24   Chandra Lamar Sherlean Jama, DO    Allergies: Patient has no known allergies.    Review of Systems  Constitutional:  Positive for fatigue.  Endocrine: Positive for polydipsia.  Neurological:  Positive for syncope and light-headedness.  All other systems reviewed and are negative.   Updated Vital Signs BP (!) 176/97 (BP Location: Right Arm)   Pulse 89   Temp (!) 97.5 F (36.4 C) (Oral)   Resp 16   Ht 5' 11 (1.803 m)   Wt 99.8 kg   SpO2 98%   BMI 30.68 kg/m   Physical Exam Vitals and nursing note reviewed.  Constitutional:      General: He is not in acute distress.    Appearance: Normal appearance. He is well-developed. He is not ill-appearing, toxic-appearing or diaphoretic.  HENT:     Head: Normocephalic and atraumatic.     Right Ear: External ear normal.     Left Ear: External ear normal.     Nose: Nose normal.  Eyes:     Extraocular Movements: Extraocular movements intact.     Comments: Bilateral conjunctival injection  Cardiovascular:     Rate and Rhythm: Normal rate and regular rhythm.     Heart sounds: No murmur heard. Pulmonary:     Effort: Pulmonary effort is normal. No  respiratory distress.     Breath sounds: Normal breath sounds. No wheezing or rales.  Chest:     Chest wall: No tenderness.  Abdominal:     General: There is no distension.     Palpations: Abdomen is soft.     Tenderness: There is no abdominal tenderness.  Musculoskeletal:        General: Tenderness present. No swelling or deformity. Normal range of motion.     Cervical back: Normal range of motion and neck supple.  Skin:    General: Skin is warm and dry.     Coloration: Skin is not jaundiced or pale.  Neurological:     General: No focal deficit present.     Mental Status: He is alert and oriented to person, place, and time.  Psychiatric:         Mood and Affect: Mood normal.        Behavior: Behavior normal.     (all labs ordered are listed, but only abnormal results are displayed) Labs Reviewed  BASIC METABOLIC PANEL WITH GFR - Abnormal; Notable for the following components:      Result Value   Sodium 125 (*)    Chloride 91 (*)    Glucose, Bld 754 (*)    Creatinine, Ser 1.38 (*)    Calcium  8.6 (*)    All other components within normal limits  BLOOD GAS, VENOUS - Abnormal; Notable for the following components:   pCO2, Ven 43 (*)    pO2, Ven 76 (*)    All other components within normal limits  OSMOLALITY - Abnormal; Notable for the following components:   Osmolality 313 (*)    All other components within normal limits  CBG MONITORING, ED - Abnormal; Notable for the following components:   Glucose-Capillary >600 (*)    All other components within normal limits  BETA-HYDROXYBUTYRIC ACID  CBC WITH DIFFERENTIAL/PLATELET  ETHANOL  MAGNESIUM   URINALYSIS, ROUTINE W REFLEX MICROSCOPIC  URINE DRUG SCREEN  CBG MONITORING, ED  TROPONIN T, HIGH SENSITIVITY  TROPONIN T, HIGH SENSITIVITY    EKG: EKG Interpretation Date/Time:  Wednesday September 01 2024 03:06:29 EST Ventricular Rate:  90 PR Interval:  134 QRS Duration:  110 QT Interval:  376 QTC Calculation: 461 R Axis:   -73  Text Interpretation: Sinus rhythm Left anterior fascicular block Low voltage, precordial leads Probable anteroseptal infarct, old Confirmed by Melvenia Motto [phone]) on 09/01/2024 3:08:55 AM  Radiology: DG Chest Portable 1 View Result Date: 09/01/2024 EXAM: 1 VIEW XRAY OF THE CHEST 09/01/2024 03:30:00 AM COMPARISON: 07/16/2024 CLINICAL HISTORY: Fall. FINDINGS: LUNGS AND PLEURA: No focal pulmonary opacity. No pleural effusion. No pneumothorax. HEART AND MEDIASTINUM: Overlapping cardiac leads in place. No acute abnormality of the cardiac and mediastinal silhouettes. BONES AND SOFT TISSUES: No acute osseous abnormality. IMPRESSION: 1. No acute process.  Electronically signed by: Greig Pique MD 09/01/2024 03:39 AM EST RP Workstation: [id number]   DG Hand Complete Right Result Date: 09/01/2024 EXAM: 2 VIEW(S) XRAY OF THE RIGHT HAND 09/01/2024 03:28:00 AM COMPARISON: right hand x-ray 02/15/2024. CLINICAL HISTORY: Fall; pain. FINDINGS: BONES AND JOINTS: No acute fracture. No malalignment. SOFT TISSUES: Unremarkable. IMPRESSION: 1. No acute fracture or dislocation. Electronically signed by: Greig Pique MD 09/01/2024 03:38 AM EST RP Workstation: [id number]     Procedures   Medications Ordered in the ED  insulin  aspart (novoLOG ) injection 10 Units (has no administration in time range)  lactated ringers  bolus 1,000 mL (has no administration in time range)  amLODipine  (  NORVASC ) tablet 10 mg (has no administration in time range)  hydrALAZINE  (APRESOLINE ) tablet 25 mg (has no administration in time range)  lactated ringers  bolus 2,000 mL (2,000 mLs Intravenous New Bag/Given 09/01/24 0311)                                    Medical Decision Making Amount and/or Complexity of Data Reviewed Labs: ordered. Radiology: ordered.  Risk Prescription drug management. Decision regarding hospitalization.   This patient presents to the ED for concern of syncope, this involves an extensive number of treatment options, and is a complaint that carries with it a high risk of complications and morbidity.  The differential diagnosis includes dehydration, anemia, metabolic derangements, intoxication, arrhythmia, vasovagal episode   Co morbidities / Chronic conditions that complicate the patient evaluation  DM, HTN, HLD, substance abuse, depression   Additional history obtained:  Additional history obtained from EMR External records from outside source obtained and reviewed including N/A   Lab Tests:  I Ordered, and personally interpreted labs.  The pertinent results include: Hyperglycemia without evidence of DKA.  Pseudohyponatremia is present.  AKI is  present.   Imaging Studies ordered:  I ordered imaging studies including x-ray of chest and right hand I independently visualized and interpreted imaging which showed no acute findings I agree with the radiologist interpretation   Cardiac Monitoring: / EKG:  The patient was maintained on a cardiac monitor.  I personally viewed and interpreted the cardiac monitored which showed an underlying rhythm of: Sinus rhythm   Problem List / ED Course / Critical interventions / Medication management  Patient presenting after syncopal episode.  On arrival, he is alert and oriented.  He states that he feels dehydrated from polyuria in setting of nonadherence to his insulin  regimen.He also endorses pain in area of inner webspace between 1st and 2nd digits of right hand.  He feels that he may have injured it during a syncopal episode.  Area is tender but not significantly swollen.  He does have full range of motion of his digits.  There is an old scar in area of his pain.  Will obtain x-ray.  Lab work and IV fluids were ordered.  Patient's blood glucose is markedly elevated in the range of 750.  He does not have acidosis to suggest DKA.  Dose of insulin  and additional IV fluids were ordered.  He does have an AKI present.  On imaging studies, no acute findings were identified.  On reassessment, patient remains hypertensive.  Home blood pressure medications were ordered.  Given his AKI, will hold off on his lisinopril  for now.  Given what is expected to be significant dehydration in the setting of no insulin  in 5 days send ongoing polyuria, patient to be admitted for further management. I ordered medication including IV fluids and insulin  for hyperglycemia and dehydration; hydralazine  and amlodipine  for hypertension Reevaluation of the patient after these medicines showed that the patient improved I have reviewed the patients home medicines and have made adjustments as needed  Social Determinants of  Health:  Frequent ED visits.     Final diagnoses:  Hyperglycemia  Dehydration  AKI (acute kidney injury)  Syncope, unspecified syncope type    ED Discharge Orders     None          Melvenia Motto, MD 09/01/24 [phone]  "

## 2024-09-01 NOTE — H&P (Addendum)
 " Telemedicine History and Physical    Referring Provider: Bernardino Fireman Telemedicine Provider: Donalda Applebaum MD Provider Location: [facility] Patient Location: [facility] Referring Diagnosis: HHS Patient Name and DOB verified: yes Patient consented to Telemedicine Evaluation:yes RN virtual assistant: Ester Shawl Video encounter time and date: 7:50 am on 09/01/2024   Patient: [name] [name] FMW:[id number] DOB: 1986-06-02   PCP: Patient, No Pcp Per   Chief Complaint:  Chief Complaint  Patient presents with   Hyperglycemia    BIBA from [facility] gas station w/ c/o high blood sugar.  PT noncompliant with medications.  Currently without a home.  FSBS last 498   HPI: [name] [name] is a 39 y.o. male with medical history significant of DM-2, HTN, depression who presented with complaints of high blood sugar and reportedly syncopal episode.  Patient apparently was recently admitted for [facility] was discharged approximately 5-6 days ago-since his discharge he has been noncompliant with his medications.  Earlier yesterday evening-he was at [facility] felt woozy and sustained a syncopal episode.  EMS was called-patient was noted to be hyperglycemic and hypertensive-he was then transported to [facility] emergency room-where he was found to have uncontrolled hyperglycemia suggestive of HHS-and hypertension.  He was given IV fluids-SQ insulin -the hospitalist service was asked to admit this patient for further evaluation and treatment.  Per patient-ever since his discharge from behavioral health-he has not taking his insulin -apparently he is currently homeless and has been sleeping in a charge-and unfortunately lost his insulin .  He acknowledges several days of polydipsia-polyuria and just feeling fatigued.  He denies any nausea vomiting or diarrhea.  No fever.  Does acknowledge mild rhinorrhea.   Patient denies any fever Denies any headache No chest pain No shortness of breath No nausea, vomiting  or diarrhea No abdominal pain Hematuria No hematochezia/melena    Review of Systems: As mentioned in the history of present illness. All other systems reviewed and are negative. Past Medical History:  Diagnosis Date   Diabetes mellitus without complication (HCC)    DKA (diabetic ketoacidosis) (HCC) 05/08/2022   HTN (hypertension) 05/08/2022   Hyperlipidemia    Hyperosmolar hyperglycemic state (HHS) (HCC) 05/08/2022   Hypertension    Hypoglycemia 07/02/2019   Pseudohyponatremia 05/08/2022   Past Surgical History:  Procedure Laterality Date   HAND SURGERY     Social History:  reports that he has been smoking cigarettes. He has never used smokeless tobacco. He reports current drug use. Drug: Amphetamines. He reports that he does not drink alcohol.  Allergies[1]  Family History  Family history unknown: Yes    Prior to Admission medications  Medication Sig Start Date End Date Taking? Authorizing Provider  amLODipine  (NORVASC ) 10 MG tablet Take 1 tablet (10 mg total) by mouth daily. 08/25/24   Chandra Lamar Sherlean Jama, DO  escitalopram  (LEXAPRO ) 5 MG tablet Take 1 tablet (5 mg total) by mouth daily. 08/25/24   Chandra Lamar Sherlean Jama, DO  hydrALAZINE  (APRESOLINE ) 25 MG tablet Take 1 tablet (25 mg total) by mouth every 8 (eight) hours. Patient not taking: Reported on 05/24/2024 04/05/24 05/24/24  Leotis Bogus, MD  insulin  aspart (NOVOLOG ) 100 UNIT/ML injection Inject 15 Units into the skin with breakfast, with lunch, and with evening meal. 08/25/24   Chandra Lamar Sherlean Jama, DO  insulin  glargine (LANTUS ) 100 UNIT/ML injection Inject 0.25 mLs (25 Units total) into the skin at bedtime. 08/25/24   Chandra Lamar Sherlean Jama, DO  Lancet Device MISC Use 3 (three) times daily. 05/05/24  Vernon Ranks, MD  lisinopril  (ZESTRIL ) 5 MG tablet Take 1 tablet (5 mg total) by mouth daily. 08/25/24   Chandra Lamar Sherlean Jama, DO  nicotine  polacrilex (NICORETTE ) 2 MG gum Take 1 each (2 mg  total) by mouth as needed for smoking cessation. 08/25/24   Chandra Lamar Sherlean Jama, DO    Physical Exam: Done by Ester Sloop paramedic during this encounter Vitals:   09/01/24 0303 09/01/24 0304 09/01/24 0524 09/01/24 0528  BP: (!) 176/97  (!) 168/114 (!) 168/114  Pulse: 89   95  Resp: 16   16  Temp: (!) 97.5 F (36.4 C)   98.3 F (36.8 C)  TempSrc: Oral   Oral  SpO2: 98%   98%  Weight:  99.8 kg    Height:  5' 11 (1.803 m)     Gen Exam:Alert awake-not in any distress HEENT:atraumatic, normocephalic Chest: B/L clear to auscultation anteriorly CVS:S1S2 regular Abdomen:soft non tender, non distended Extremities:no edema Neurology: Non focal Skin: no rash   Data Reviewed:     Latest Ref Rng & Units 09/01/2024    3:10 AM 08/21/2024   11:35 PM 08/20/2024    3:00 AM  CBC  WBC 4.0 - 10.5 K/uL 5.2   5.7   Hemoglobin 13.0 - 17.0 g/dL 85.8  85.6  84.9   Hematocrit 39.0 - 52.0 % 42.1  42.0  43.5   Platelets 150 - 400 K/uL 266   222      BMET    Component Value Date/Time   NA 125 (L) 09/01/2024 0310   K 5.0 09/01/2024 0310   CL 91 (L) 09/01/2024 0310   CO2 23 09/01/2024 0310   GLUCOSE 754 (HH) 09/01/2024 0310   BUN 17 09/01/2024 0310   CREATININE 1.38 (H) 09/01/2024 0310   CALCIUM  8.6 (L) 09/01/2024 0310   GFRNONAA >60 09/01/2024 0310     X-ray right hand: No acute fracture or dislocation Chest x-ray: No acute process.  Twelve-lead EKG: Sinus rhythm  Assessment and Plan: Hyperglycemic hyperosmolar nonketotic state Last A1c> 15.5 on 02/28/2024 Sugars remain persistently elevated in the ED in spite of IV fluids and SQ insulin  Start IV insulin /IV fluids per hypoglycemic protocol Follow electrolytes/sugars and transition to SQ insulin  when able.  Syncope Probably secondary to orthostatic hypotension in the setting of osmotic diuresis due to above Check echo Telemetry monitoring  AKI Probably likely secondary to osmotic diuresis in the setting of  hyperglycemia Treat underlying hyperglycemia-IV fluids and recheck electrolytes tomorrow Avoid nephrotoxic agents.  HTN BP uncontrolled-resume amlodipine  and hydralazine  Enalapril held due to AKI  Tobacco abuse Transdermal nicotine   Polysubstance abuse Acknowledges using amphetamines last week  Noncompliance to medications Counseled  Homelessness TOC evaluation prior to discharge   Advance Care Planning:   Code Status: Full Code   Consults: None  Family Communication: None  Severity of Illness: The appropriate patient status for this patient is OBSERVATION. Observation status is judged to be reasonable and necessary in order to provide the required intensity of service to ensure the patient's safety. The patient's presenting symptoms, physical exam findings, and initial radiographic and laboratory data in the context of their medical condition is felt to place them at decreased risk for further clinical deterioration. Furthermore, it is anticipated that the patient will be medically stable for discharge from the hospital within 2 midnights of admission.   Author: Donalda Applebaum, MD 09/01/2024 8:06 AM  For on call review [email or web address].      [1] No Known Allergies  "

## 2024-09-02 LAB — BASIC METABOLIC PANEL WITH GFR
Anion gap: 11 (ref 5–15)
BUN: 13 mg/dL (ref 6–20)
CO2: 21 mmol/L — ABNORMAL LOW (ref 22–32)
Calcium: 9.2 mg/dL (ref 8.9–10.3)
Chloride: 101 mmol/L (ref 98–111)
Creatinine, Ser: 1.03 mg/dL (ref 0.61–1.24)
GFR, Estimated: >60 mL/min
Glucose, Bld: 231 mg/dL — ABNORMAL HIGH (ref 70–99)
Potassium: 4.4 mmol/L (ref 3.5–5.1)
Sodium: 133 mmol/L — ABNORMAL LOW (ref 135–145)

## 2024-09-02 LAB — CBC
HCT: 46.6 % (ref 39.0–52.0)
Hemoglobin: 15.6 g/dL (ref 13.0–17.0)
MCH: 28.3 pg (ref 26.0–34.0)
MCHC: 33.5 g/dL (ref 30.0–36.0)
MCV: 84.6 fL (ref 80.0–100.0)
Platelets: 254 10*3/uL (ref 150–400)
RBC: 5.51 MIL/uL (ref 4.22–5.81)
RDW: 12.1 % (ref 11.5–15.5)
WBC: 5.4 10*3/uL (ref 4.0–10.5)
nRBC: 0 % (ref 0.0–0.2)

## 2024-09-02 LAB — HEPATIC FUNCTION PANEL
ALT: 15 U/L (ref 0–44)
AST: 30 U/L (ref 15–41)
Albumin: 3.4 g/dL — ABNORMAL LOW (ref 3.5–5.0)
Alkaline Phosphatase: 78 U/L (ref 38–126)
Bilirubin, Direct: 0.2 mg/dL (ref 0.0–0.2)
Indirect Bilirubin: 0.4 mg/dL (ref 0.3–0.9)
Total Bilirubin: 0.6 mg/dL (ref 0.0–1.2)
Total Protein: 6.3 g/dL — ABNORMAL LOW (ref 6.5–8.1)

## 2024-09-02 LAB — PHOSPHORUS: Phosphorus: 3.5 mg/dL (ref 2.5–4.6)

## 2024-09-02 LAB — GLUCOSE, CAPILLARY
Glucose-Capillary: 222 mg/dL — ABNORMAL HIGH (ref 70–99)
Glucose-Capillary: 408 mg/dL — ABNORMAL HIGH (ref 70–99)
Glucose-Capillary: 247 mg/dL — ABNORMAL HIGH (ref 70–99)
Glucose-Capillary: 145 mg/dL — ABNORMAL HIGH (ref 70–99)

## 2024-09-02 LAB — MAGNESIUM: Magnesium: 2.1 mg/dL (ref 1.7–2.4)

## 2024-09-02 MED ORDER — SODIUM CHLORIDE 0.9 % IV SOLN: INTRAVENOUS | Status: DC

## 2024-09-02 MED ORDER — INSULIN GLARGINE-YFGN 100 UNIT/ML ~~LOC~~ SOLN
25.0000 [IU] | SUBCUTANEOUS | Status: DC
  Administered 2024-09-02: 25 [IU] via SUBCUTANEOUS
  Filled 2024-09-02 (×2): qty 0.25

## 2024-09-02 MED ORDER — INSULIN ASPART 100 UNIT/ML IJ SOLN
6.0000 [IU] | Freq: Three times a day (TID) | INTRAMUSCULAR | Status: DC
  Administered 2024-09-02 (×2): 6 [IU] via SUBCUTANEOUS
  Filled 2024-09-02 (×2): qty 6

## 2024-09-02 MED ORDER — INSULIN ASPART 100 UNIT/ML IJ SOLN
8.0000 [IU] | Freq: Three times a day (TID) | INTRAMUSCULAR | Status: DC
  Administered 2024-09-03 (×2): 8 [IU] via SUBCUTANEOUS
  Filled 2024-09-02 (×2): qty 8

## 2024-09-02 NOTE — TOC Initial Note (Signed)
 Transition of Care [facility]) - Initial/Assessment Note    Patient Details  Name: [name] [name] MRN: [id number] Date of Birth: [birth year]  Transition of Care [facility]) CM/SW Contact:    NORMAN ASPEN, LCSW Phone Number: 09/02/2024, 3:34 PM  Clinical Narrative:                  Referral received to address homeless and additional social and SA concerns.  After chart review, met with pt to review resources he has accessed before and current/ new needs.  Pt very pleasant and engaged.  He confirms he is currently homeless and is very familiar with IRC/ has accessed this resource.  Notes he has been using the warming shelter at night operated by [facility].  He confirms that he was just discharged from [facility] ~ 5 days ago and reports that the insulin  he has was thrown out.  Confirms that he has active Medicaid, however, no PCP.  Noted to patient that, per [facility] dc notes, he had been set up for an appointment at [facility] as well as [facility].  He says he was unable to make either of those appointments.  He has not been connected with a local medical clinic and would like to be.  Pt reports that his ultimate goal is to return to [place]  where he has more family support.    With patient's agreement, have placed referral to [facility] center for a new patient PCP appointment.  [facility] to contact patient directly once appointment has been set up (phone # on record is correct).  Will plan to have dc meds filled at [facility] OP pharmacy and deliver to unit when he is cleared for dc.  Pt agreeable to have transportation assistance and plan to go to [facility] at dc.  Encouraged him to speak with case managers at [facility] about desire to return to [facility] and see if they might be able to assist with this in any way.  Pt agreeable to have SA resources and other information placed on AVS.  Will continue to follow patient while here and hope to be able to confirm appt is set for new PCP.    Expected Discharge Plan:  Homeless Shelter Barriers to Discharge: Homeless with medical needs, Continued Medical Work up   Patient Goals and CMS Choice Patient states their goals for this hospitalization and ongoing recovery are:: ultimately, pt would like to return to his home in [place].          Expected Discharge Plan and Services In-house Referral: Clinical Social Work Discharge Planning Services: Indigent Health Clinic   Living arrangements for the past 2 months: Homeless Shelter                                      Prior Living Arrangements/Services Living arrangements for the past 2 months: Homeless Shelter Lives with:: Self Patient language and need for interpreter reviewed:: Yes Do you feel safe going back to the place where you live?: Yes      Need for Family Participation in Patient Care: No (Comment) Care giver support system in place?: No (comment)   Criminal Activity/Legal Involvement Pertinent to Current Situation/Hospitalization: No - Comment as needed  Activities of Daily Living   ADL Screening (condition at time of admission) Independently performs ADLs?: Yes (appropriate for developmental age) Is the patient deaf or have difficulty hearing?: No Does  the patient have difficulty seeing, even when wearing glasses/contacts?: No Does the patient have difficulty concentrating, remembering, or making decisions?: No  Permission Sought/Granted Permission sought to share information with : Other (comment) Permission granted to share information with : Yes, Verbal Permission Granted     Permission granted to share info w AGENCY: community agency for referral purposes        Emotional Assessment Appearance:: Appears stated age Attitude/Demeanor/Rapport: Gracious Affect (typically observed): Accepting Orientation: : Oriented to Self, Oriented to Place, Oriented to  Time, Oriented to Situation Alcohol / Substance Use: Alcohol Use, Illicit Drugs Psych Involvement: No (comment) (but  recent hospitalization at [facility])  Admission diagnosis:  Dehydration [E86.0] Hyperglycemia [R73.9] AKI (acute kidney injury) [N17.9] Syncope, unspecified syncope type [R55] Hyperosmolar hyperglycemic coma due to diabetes mellitus without ketoacidosis (HCC) [E11.01] Patient Active Problem List   Diagnosis Date Noted   Hyperosmolar hyperglycemic coma due to diabetes mellitus without ketoacidosis (HCC) 09/01/2024   Adjustment disorder with depressed mood 08/21/2024   Severe episode of recurrent major depressive disorder, 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/20/2024   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   Chest pain 02/07/2024   Syncope 12/12/2023   DKA (diabetic ketoacidosis) (HCC) 11/30/2023   Hyperosmolar non-ketotic state due to type 2 diabetes mellitus (HCC) 11/29/2023   Type 2 diabetes mellitus with hyperosmolar hyperglycemic state (HHS) (HCC) 08/25/2023   Sepsis (HCC) 07/20/2023   Influenza B 07/19/2023   Current episode of major depressive disorder without prior episode 07/14/2023   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   Hyperkalemia 07/04/2023   Hypoglycemia associated with type 2 diabetes mellitus (HCC) 05/07/2023   Hyperglycemia 04/20/2023   Acute metabolic encephalopathy 04/20/2023   Abdominal pain 01/01/2023   Tobacco abuse 01/01/2023   MSSA bacteremia 05/19/2022   Hyperosmolar hyperglycemic state (HHS) (HCC) 05/16/2022   Hypertension associated with diabetes (HCC) 05/08/2022   AKI (acute kidney injury) 05/08/2022   Pseudohyponatremia 05/08/2022   DM2 (diabetes mellitus, type 2) (HCC) 05/08/2022   Substance induced mood disorder (HCC) 04/06/2022   PCP:  Patient, No Pcp Per Pharmacy:   [id number] [place], [place] - [address] AT [facility] [address] [facility] [address] & [address] [place] [address] Phone: [phone] Fax: [phone]  [id number] - [place],  - [address] AT [facility] [address] [place] [address] Phone: [phone] Fax: [phone]     Social Drivers of Health (SDOH) Social History: SDOH Screenings   Food Insecurity: Food Insecurity Present (09/01/2024)  Housing: High Risk (09/01/2024)  Transportation Needs: Unmet Transportation Needs (09/01/2024)  Utilities: Not At Risk (09/01/2024)  Alcohol Screen: Low Risk (08/20/2024)  Depression (PHQ2-9): High Risk (08/20/2024)  Tobacco Use: High Risk (09/01/2024)   SDOH Interventions: Food Insecurity Interventions: Inpatient TOC, Community Resources Provided Housing Interventions: [employer] Provided, Inpatient [employer] Transportation Interventions: Inpatient TOC, Bus Pass Given, Taxi Voucher Given   Readmission Risk Interventions    09/30/2023   11:37 AM 07/21/2023   12:30 PM 07/04/2023   12:49 PM  Readmission Risk Prevention Plan  Transportation Screening Complete Complete Complete  Medication Review Oceanographer) Complete Complete Complete  PCP or Specialist appointment within 3-5 days of discharge  Complete Complete  HRI or Home Care Consult Complete Complete Complete  HRI or Home Care Consult Pt Refusal Comments  N/A   SW Recovery Care/Counseling Consult Complete Complete Complete  Palliative Care Screening Not Applicable Not Applicable  Not Applicable  Skilled Nursing Facility Not Applicable Not Applicable Not Applicable

## 2024-09-02 NOTE — Hospital Course (Addendum)
 [name] [name] is a 39 y.o. male with medical history significant of DM-2, HTN, depression who presented with complaints of high blood sugar and reportedly syncopal episode.   Patient apparently was recently admitted for [facility] was discharged approximately 5-6 days ago-since his discharge he has been noncompliant with his medications.  Earlier yesterday evening-he was at [facility] felt woozy and sustained a syncopal episode.    He was admitted and placed on insulin  drip and admitted to the hospital and subsequently was weaned and transition.  Sugars are improving and he was weaned off of the insulin  drip to subcu insulin  which is being adjusted.  Working up for his syncopal episode and awaiting PT OT evaluation but anticipate discharge in the next 24 to 4 hours if he does well.  Assessment and Plan:  Hyperglycemic hyperosmolar nonketotic state: Last A1c> 15.5 on 02/28/2024 and now it is 13.7. Sugars remained persistently elevated in the ED in spite of IV fluids and SQ insulin  Start IV insulin /IV fluids per hypoglycemic protocol; Transitioned to sq Insulin . Now on insulin  glargine 25 units SQ every 24, sensitive NovoLog /scale insulin  AC and increasing 6 units of insulin  aspart subcu 3 times daily with meals to 8 units -Diabetes education coordinator consulted and appreciate further evaluation recommendations.  If blood sugars remain stable likely can be discharged in next 24 hours. CBG Trend:  Recent Labs  Lab 09/01/24 1831 09/01/24 2052 09/02/24 0735 09/02/24 1147 09/02/24 1617 09/02/24 2144 09/03/24 0717  GLUCAP 162* 231* 222* 408* 247* 145* 289*    Syncope: Probably secondary to orthostatic hypotension in the setting of osmotic diuresis due to above. C/w Telemetry Monitoring. Check Orthostatic VS. ECHO done and showed LVEF of 55 to 60% w/ no RWMA. Patient had mild concentric LVH and LVDP Normal. Awaiting PT/OT to eval  Urinary Retention: Catheter removed yesterday. Check Bladder Scan   AKI  / Metabolic Acidosis: Probably likely secondary to osmotic diuresis in the setting of hyperglycemia Treat underlying hyperglycemia-IV fluids. BUN/Cr Trend: Recent Labs  Lab 08/20/24 0300 08/21/24 2335 09/01/24 0310 09/01/24 0847 09/01/24 1208 09/02/24 0513 09/03/24 0545  BUN 11 14 17 15 14 13 19   CREATININE 0.98 1.10 1.38* 1.26* 1.09 1.03 1.02  -Has a mild MA w/ a CO2 of 21, AG of 11, Chloride Lvl of 101 -C/w NS @ 100 mL/hr -Avoid Nephrotoxic Medications, Contrast Dyes, Hypotension and Dehydration to Ensure Adequate Renal Perfusion and will need to Renally Adjust Meds -Continue to Monitor and Trend Renal Function carefully and repeat CMP in the AM    Essential HTN: BP uncontrolled-resume amlodipine  and hydralazine  Enalapril held due to AKI. CTM BP per Protocol. Last BP reading was 141/84   Tobacco abuse: C/w Transdermal nicotine . Counseling given   Polysubstance abuse: Acknowledges using amphetamines last week   Noncompliance to medications: Counseled   Homelessness: TOC evaluation prior to discharge  Depression and Anxiety: C/w Escitalopram  5 mg po Daily; recently discharged from [facility] on 08/25/2024  Hypoalbuminemia:Patient's Albumin Trend:  Recent Labs  Lab 08/05/24 0110 08/11/24 1838 08/17/24 2223 08/18/24 2307 08/20/24 0300 09/02/24 0513 09/03/24 0545  ALBUMIN 3.9 3.7 4.1 3.7 3.5 3.4* 3.5  -Continue to Monitor and Trend and repeat CMP in the AM  Class I Obesity: Complicates overall prognosis and care. Estimated body mass index is 30.68 kg/m as calculated from the following:   Height as of this encounter: 5' 11 (1.803 m).   Weight as of this encounter: 99.8 kg. Weight Loss and Dietary Counseling given

## 2024-09-02 NOTE — Progress Notes (Signed)
 " PROGRESS NOTE    [name] [name]  FMW:[id number] DOB: [birth year] DOA: 09/01/2024 PCP: Patient, No Pcp Per   Brief Narrative:  [name] [name] is a 39 y.o. male with medical history significant of DM-2, HTN, depression who presented with complaints of high blood sugar and reportedly syncopal episode.   Patient apparently was recently admitted for [facility] was discharged approximately 5-6 days ago-since his discharge he has been noncompliant with his medications.  Earlier yesterday evening-he was at [facility] felt woozy and sustained a syncopal episode.    He was admitted and placed on insulin  drip and admitted to the hospital and subsequently was weaned and transition.  Sugars are improving and he was weaned off of the insulin  drip to subcu insulin  which is being adjusted.  Working up for his syncopal episode and awaiting PT OT evaluation but anticipate discharge in the next 24 to 4 hours if he does well.  Assessment and Plan:  Hyperglycemic hyperosmolar nonketotic state: Last A1c> 15.5 on 02/28/2024 and now it is 13.7. Sugars remained persistently elevated in the ED in spite of IV fluids and SQ insulin  Start IV insulin /IV fluids per hypoglycemic protocol; Transitioned to sq Insulin . Now on insulin  glargine 25 units SQ every 24, sensitive NovoLog /scale insulin  AC and increasing 6 units of insulin  aspart subcu 3 times daily with meals to 8 units -Diabetes education coordinator consulted and appreciate further evaluation recommendations.  If blood sugars remain stable likely can be discharged in next 24 hours. CBG Trend:  Recent Labs  Lab 09/01/24 1604 09/01/24 1705 09/01/24 1831 09/01/24 2052 09/02/24 0735 09/02/24 1147 09/02/24 1617  GLUCAP 135* 141* 162* 231* 222* 408* 247*    Syncope: Probably secondary to orthostatic hypotension in the setting of osmotic diuresis due to above. C/w Telemetry Monitoring. Check Orthostatic VS. ECHO done and showed LVEF of 55 to 60% w/ no RWMA. Patient had mild  concentric LVH and LVDP Normal. Awaiting PT/OT to eval  Urinary Retention: Catheter removed yesterday. Check Bladder Scan   AKI / Metabolic Acidosis: Probably likely secondary to osmotic diuresis in the setting of hyperglycemia Treat underlying hyperglycemia-IV fluids. BUN/Cr Trend: Recent Labs  Lab 08/19/24 0216 08/20/24 0300 08/21/24 2335 09/01/24 0310 09/01/24 0847 09/01/24 1208 09/02/24 0513  BUN 16 11 14 17 15 14 13   CREATININE 1.23 0.98 1.10 1.38* 1.26* 1.09 1.03  -Has a mild MA w/ a CO2 of 21, AG of 11, Chloride Lvl of 101 -C/w NS @ 100 mL/hr -Avoid Nephrotoxic Medications, Contrast Dyes, Hypotension and Dehydration to Ensure Adequate Renal Perfusion and will need to Renally Adjust Meds -Continue to Monitor and Trend Renal Function carefully and repeat CMP in the AM    Essential HTN: BP uncontrolled-resume amlodipine  and hydralazine  Enalapril held due to AKI. CTM BP per Protocol. Last BP reading was 141/84   Tobacco abuse: C/w Transdermal nicotine . Counseling given   Polysubstance abuse: Acknowledges using amphetamines last week   Noncompliance to medications: Counseled   Homelessness: TOC evaluation prior to discharge  Depression and Anxiety: C/w Escitalopram  5 mg po Daily; recently discharged from [facility] on 08/25/2024  Hypoalbuminemia:Patient's Albumin Trend:  Recent Labs  Lab 08/05/24 0110 08/11/24 1838 08/17/24 2223 08/18/24 2307 08/20/24 0300 09/02/24 0513  ALBUMIN 3.9 3.7 4.1 3.7 3.5 3.4*  -Continue to Monitor and Trend and repeat CMP in the AM  Class I Obesity: Complicates overall prognosis and care. Estimated body mass index is 30.68 kg/m as calculated from the following:   Height as of this encounter:  5' 11 (1.803 m).   Weight as of this encounter: 99.8 kg. Weight Loss and Dietary Counseling given   DVT prophylaxis: enoxaparin  (LOVENOX ) injection 40 mg Start: 09/01/24 0900    Code Status: Full Code Family Communication: No family present @ bedside    Disposition Plan:  Level of care: Med-Surg Status is: Observation The patient remains OBS appropriate and will d/c before 2 midnights.   Consultants:  None  Procedures:  As delineated as above  Antimicrobials:  Anti-infectives (From admission, onward)    None       Subjective: Seen and examined at bedside he thinks he is doing little bit better.  Had some abdominal discomfort in the early afternoon.  Blood sugars are still slightly elevated so adjusting his regimen.  No other concerns or complaints at this time.  Objective: Vitals:   09/02/24 0906 09/02/24 0958 09/02/24 1249 09/02/24 1326  BP: 133/88  (!) 148/91 (!) 141/84  Pulse: 100 91  93  Resp: 18 16  16   Temp: 98.9 F (37.2 C) 98.9 F (37.2 C)  98.4 F (36.9 C)  TempSrc: Oral Oral  Oral  SpO2: 100% 98%  100%  Weight:      Height:        Intake/Output Summary (Last 24 hours) at 09/02/2024 2015 Last data filed at 09/02/2024 1800 Gross per 24 hour  Intake 2780 ml  Output 550 ml  Net 2230 ml   Filed Weights   09/01/24 0304  Weight: 99.8 kg   Examination: Physical Exam:  Constitutional: WN/WD obese chronically ill-appearing African-American male no acute distress who is resting Respiratory: Diminished to auscultation bilaterally, no wheezing, rales, rhonchi or crackles. Normal respiratory effort and patient is not tachypenic. No accessory muscle use.  Unlabored breathing Cardiovascular: RRR, no murmurs / rubs / gallops. S1 and S2 auscultated.  No extremity edema Abdomen: Soft, mildly-tender, distended secondary to body habitus. Bowel sounds positive.  GU: Deferred. Musculoskeletal: No clubbing / cyanosis of digits/nails. No joint deformity upper and lower extremities Skin: No rashes, lesions, ulcers on a limited skin evaluation. No induration; Warm and dry.  Neurologic: CN 2-12 grossly intact with no focal deficits. Romberg sign and cerebellar reflexes not assessed.  Psychiatric: Normal judgment and insight.  Alert and oriented x 3. Normal mood and appropriate affect.   Data Reviewed: I have personally reviewed following labs and imaging studies  CBC: Recent Labs  Lab 09/01/24 0310 09/01/24 0847 09/02/24 0513  WBC 5.2 5.4 5.4  NEUTROABS 3.0  --   --   HGB 14.1 13.8 15.6  HCT 42.1 42.2 46.6  MCV 85.4 85.6 84.6  PLT 266 271 254   Basic Metabolic Panel: Recent Labs  Lab 09/01/24 0310 09/01/24 0847 09/01/24 1208 09/02/24 0513  NA 125* 131* 132* 133*  K 5.0 4.5 4.2 4.4  CL 91* 98 101 101  CO2 23 24 17* 21*  GLUCOSE 754* 459* 272* 231*  BUN 17 15 14 13   CREATININE 1.38* 1.26* 1.09 1.03  CALCIUM  8.6* 9.2 8.8* 9.2  MG 2.2  --   --  2.1  PHOS  --   --   --  3.5   GFR: Estimated Creatinine Clearance: 115.9 mL/min (by C-G formula based on SCr of 1.03 mg/dL). Liver Function Tests: Recent Labs  Lab 09/02/24 0513  AST 30  ALT 15  ALKPHOS 78  BILITOT 0.6  PROT 6.3*  ALBUMIN 3.4*   No results for input(s): LIPASE, AMYLASE in the last 168 hours. No  results for input(s): AMMONIA in the last 168 hours. Coagulation Profile: No results for input(s): INR, PROTIME in the last 168 hours. Cardiac Enzymes: No results for input(s): CKTOTAL, CKMB, CKMBINDEX, TROPONINI in the last 168 hours. BNP (last 3 results) No results for input(s): PROBNP in the last 8760 hours. HbA1C: Recent Labs    09/01/24 0847  HGBA1C 13.7*   CBG: Recent Labs  Lab 09/01/24 1831 09/01/24 2052 09/02/24 0735 09/02/24 1147 09/02/24 1617  GLUCAP 162* 231* 222* 408* 247*   Lipid Profile: No results for input(s): CHOL, HDL, LDLCALC, TRIG, CHOLHDL, LDLDIRECT in the last 72 hours. Thyroid  Function Tests: No results for input(s): TSH, T4TOTAL, FREET4, T3FREE, THYROIDAB in the last 72 hours. Anemia Panel: No results for input(s): VITAMINB12, FOLATE, FERRITIN, TIBC, IRON, RETICCTPCT in the last 72 hours. Sepsis Labs: No results for input(s):  PROCALCITON, LATICACIDVEN in the last 168 hours.  No results found for this or any previous visit (from the past 240 hours).   Radiology Studies: ECHOCARDIOGRAM COMPLETE Result Date: 09/01/2024    ECHOCARDIOGRAM REPORT   Patient Name:   BOWIE DELIA Date of Exam: 09/01/2024 Medical Rec #:  [id number]      Height:       70.9 in Accession #:    [id number]     Weight:       220.0 lb Date of Birth:  [birth year]      BSA:          2.193 m Patient Age:    38 years       BP:           143/103 mmHg Patient Gender: M              HR:           88 bpm. Exam Location:  Inpatient Procedure: 2D Echo, Cardiac Doppler, Color Doppler and Strain Analysis (Both            Spectral and Color Flow Doppler were utilized during procedure). Indications:    Syncope  History:        Patient has prior history of Echocardiogram examinations, most                 recent 09/30/2023.  Sonographer:    Odella Brewster Referring Phys: [phone] DONALDA HERO [facility]  Sonographer Comments: Image acquisition challenging due to respiratory motion. IMPRESSIONS  1. Left ventricular ejection fraction, by estimation, is 55 to 60%. Left ventricular ejection fraction by 3D volume is 58 %. The left ventricle has normal function. The left ventricle has no regional wall motion abnormalities. There is mild concentric left ventricular hypertrophy. Left ventricular diastolic parameters were normal.  2. Right ventricular systolic function is normal. The right ventricular size is normal.  3. The mitral valve is normal in structure. No evidence of mitral valve regurgitation. No evidence of mitral stenosis.  4. The aortic valve is normal in structure. Aortic valve regurgitation is not visualized. No aortic stenosis is present.  5. The inferior vena cava is normal in size with greater than 50% respiratory variability, suggesting right atrial pressure of 3 mmHg. FINDINGS  Left Ventricle: Left ventricular ejection fraction, by estimation, is 55 to 60%. Left ventricular  ejection fraction by 3D volume is 58 %. The left ventricle has normal function. The left ventricle has no regional wall motion abnormalities. The left ventricular internal cavity size was normal in size. There is mild concentric left ventricular hypertrophy. Left ventricular diastolic parameters were normal. Right  Ventricle: The right ventricular size is normal. No increase in right ventricular wall thickness. Right ventricular systolic function is normal. Left Atrium: Left atrial size was normal in size. Right Atrium: Right atrial size was normal in size. Pericardium: Trivial pericardial effusion is present. Mitral Valve: The mitral valve is normal in structure. No evidence of mitral valve regurgitation. No evidence of mitral valve stenosis. MV peak gradient, 3.2 mmHg. The mean mitral valve gradient is 2.0 mmHg. Tricuspid Valve: The tricuspid valve is normal in structure. Tricuspid valve regurgitation is not demonstrated. No evidence of tricuspid stenosis. Aortic Valve: The aortic valve is normal in structure. Aortic valve regurgitation is not visualized. No aortic stenosis is present. Aortic valve mean gradient measures 3.0 mmHg. Aortic valve peak gradient measures 5.9 mmHg. Aortic valve area, by VTI measures 3.53 cm. Pulmonic Valve: The pulmonic valve was normal in structure. Pulmonic valve regurgitation is not visualized. No evidence of pulmonic stenosis. Aorta: The aortic root is normal in size and structure. Venous: The inferior vena cava is normal in size with greater than 50% respiratory variability, suggesting right atrial pressure of 3 mmHg. IAS/Shunts: No atrial level shunt detected by color flow Doppler. Additional Comments: 3D was performed not requiring image post processing on an independent workstation and was normal.  LEFT VENTRICLE PLAX 2D LVIDd:         5.00 cm         Diastology LVIDs:         3.60 cm         LV e' medial:    7.83 cm/s LV PW:         1.30 cm         LV E/e' medial:  9.4 LV IVS:         1.00 cm         LV e' lateral:   9.68 cm/s LVOT diam:     2.30 cm         LV E/e' lateral: 7.6 LV SV:         69 LV SV Index:   31 LVOT Area:     4.15 cm        3D Volume EF LV IVRT:       127 msec        LV 3D EF:    Left                                             ventricul                                             ar LV Volumes (MOD)                            ejection LV vol d, MOD    146.0 ml                   fraction A2C:                                        by 3D LV vol d, MOD  114.0 ml                   volume is A4C:                                        58 %. LV vol s, MOD    45.4 ml A2C: LV vol s, MOD    45.4 ml       3D Volume EF: A4C:                           3D EF:        58 % LV SV MOD A2C:   100.6 ml      LV EDV:       190 ml LV SV MOD A4C:   114.0 ml      LV ESV:       80 ml LV SV MOD BP:    85.0 ml       LV SV:        110 ml RIGHT VENTRICLE             IVC RV S prime:     18.90 cm/s  IVC diam: 1.60 cm TAPSE (M-mode): 2.3 cm                             PULMONARY VEINS                             Diastolic Velocity: 34.00 cm/s                             S/D Velocity:       1.60                             Systolic Velocity:  53.30 cm/s LEFT ATRIUM             Index        RIGHT ATRIUM           Index LA diam:        2.50 cm 1.14 cm/m   RA Area:     14.10 cm LA Vol (A2C):   59.3 ml 27.04 ml/m  RA Volume:   34.20 ml  15.59 ml/m LA Vol (A4C):   42.4 ml 19.33 ml/m LA Biplane Vol: 52.6 ml 23.98 ml/m  AORTIC VALVE                    PULMONIC VALVE AV Area (Vmax):    3.25 cm     PV Vmax:          1.02 m/s AV Area (Vmean):   3.15 cm     PV Peak grad:     4.2 mmHg AV Area (VTI):     3.53 cm     PR End Diast Vel: 4.58 msec AV Vmax:           121.00 cm/s AV Vmean:          87.900 cm/s AV VTI:            0.194 m AV Peak Grad:      5.9 mmHg  AV Mean Grad:      3.0 mmHg LVOT Vmax:         94.60 cm/s LVOT Vmean:        66.700 cm/s LVOT VTI:          0.165 m LVOT/AV VTI ratio: 0.85  AORTA Ao  Root diam: 4.10 cm Ao Asc diam:  3.20 cm MITRAL VALVE               TRICUSPID VALVE MV Area (PHT): 3.93 cm    TR Peak grad:   19.5 mmHg MV Area VTI:   3.34 cm    TR Vmax:        221.00 cm/s MV Peak grad:  3.2 mmHg MV Mean grad:  2.0 mmHg    SHUNTS MV Vmax:       0.89 m/s    Systemic VTI:  0.16 m MV Vmean:      63.5 cm/s   Systemic Diam: 2.30 cm MV E velocity: 73.70 cm/s MV A velocity: 73.30 cm/s MV E/A ratio:  1.01 Kardie Tobb DO Electronically signed by Dub Huntsman DO Signature Date/Time: 09/01/2024/2:26:17 PM    Final    DG Chest Portable 1 View Result Date: 09/01/2024 EXAM: 1 VIEW XRAY OF THE CHEST 09/01/2024 03:30:00 AM COMPARISON: 07/16/2024 CLINICAL HISTORY: Fall. FINDINGS: LUNGS AND PLEURA: No focal pulmonary opacity. No pleural effusion. No pneumothorax. HEART AND MEDIASTINUM: Overlapping cardiac leads in place. No acute abnormality of the cardiac and mediastinal silhouettes. BONES AND SOFT TISSUES: No acute osseous abnormality. IMPRESSION: 1. No acute process. Electronically signed by: Greig Pique MD 09/01/2024 03:39 AM EST RP Workstation: [id number]   DG Hand Complete Right Result Date: 09/01/2024 EXAM: 2 VIEW(S) XRAY OF THE RIGHT HAND 09/01/2024 03:28:00 AM COMPARISON: right hand x-ray 02/15/2024. CLINICAL HISTORY: Fall; pain. FINDINGS: BONES AND JOINTS: No acute fracture. No malalignment. SOFT TISSUES: Unremarkable. IMPRESSION: 1. No acute fracture or dislocation. Electronically signed by: Greig Pique MD 09/01/2024 03:38 AM EST RP Workstation: [id number]   Scheduled Meds:  amLODipine   10 mg Oral Daily   enoxaparin  (LOVENOX ) injection  40 mg Subcutaneous Q24H   escitalopram   5 mg Oral Daily   hydrALAZINE   25 mg Oral Q8H   insulin  aspart  0-9 Units Subcutaneous TID WC   [START ON 09/03/2024] insulin  aspart  8 Units Subcutaneous TID WC   insulin  glargine-yfgn  25 Units Subcutaneous Q24H   nicotine   14 mg Transdermal Daily   Continuous Infusions:  sodium chloride       LOS: 0 days   Alejandro Marker, DO [facility] Available via Epic secure chat 7am-7pm After these hours, please refer to coverage provider listed on [email or web address] 09/02/2024, 8:15 PM  "

## 2024-09-02 NOTE — Inpatient Diabetes Management (Addendum)
 Inpatient Diabetes Program Recommendations  AACE/ADA: New Consensus Statement on Inpatient Glycemic Control (2015)  Target Ranges:  Prepandial:   less than 140 mg/dL      Peak postprandial:   less than 180 mg/dL (1-2 hours)      Critically ill patients:  140 - 180 mg/dL   Lab Results  Component Value Date   GLUCAP 222 (H) 09/02/2024   HGBA1C 13.7 (H) 09/01/2024    Review of Glycemic Control  Diabetes history: DM1 Outpatient Diabetes medications: Lantus  25 units daily, Novolog  15 units TID Current orders for Inpatient glycemic control: Lantus  20 Q24H, Novolog  0-9 TID with meals  HgbA1C - 13.7% CBGs 231, 222  Inpatient Diabetes Program Recommendations:    Increase Lantus  to 25 units at bedtime  Add Novolog  6 units TID with meals if eating > 50%  Will see pt this am to discuss his HgbA1C and glucose control. Make certain pt has f/u appt with PCP and is able to get his insulin  and supplies.  Continue to follow.  Thank you. Shona Brandy, RD, LDN, CDCES Inpatient Diabetes Coordinator [phone]  CBG before lunch 408 mg/dL.   Add Novolog  8 units TID with meals if eating > 50%  Spoke with pt at bedside regarding his HgbA1C of 13.7% (average blood sugar of 346 mg/dL), both glucose and [id number] goals. Pt states he hasn't seen PCP in awhile, said he is hoping to return to [place] where his family resides. Has not seen PCP in awhile. Has occasional hypos.Thinks he may be taking too much Novolog  at mealtime, may need to reduce to 10 units TID. Will complete Diabetes Discharge Order Set.  RV

## 2024-09-02 NOTE — Plan of Care (Signed)
  Problem: Skin Integrity: Goal: Risk for impaired skin integrity will decrease Outcome: Progressing   Problem: Tissue Perfusion: Goal: Adequacy of tissue perfusion will improve Outcome: Progressing   Problem: Health Behavior/Discharge Planning: Goal: Ability to manage health-related needs will improve Outcome: Progressing

## 2024-09-02 NOTE — Progress Notes (Signed)
 OT Cancellation Note  Patient Details Name: [name] [name] MRN: [id number] DOB: 17-Aug-1985   Cancelled Treatment:    Reason Eval/Treat Not Completed: Medical issues which prohibited therapy. Pt noted to have elevated glucose levels at 408, OT will hold tx and follow up as able.   Hawley Michel L. Lanetra Hartley, OTR/L  09/02/24, 12:39 PM

## 2024-09-03 ENCOUNTER — Other Ambulatory Visit (HOSPITAL_COMMUNITY): Payer: Self-pay

## 2024-09-03 LAB — COMPREHENSIVE METABOLIC PANEL WITH GFR
ALT: 12 U/L (ref 0–44)
AST: 20 U/L (ref 15–41)
Albumin: 3.5 g/dL (ref 3.5–5.0)
Alkaline Phosphatase: 74 U/L (ref 38–126)
Anion gap: 11 (ref 5–15)
BUN: 19 mg/dL (ref 6–20)
CO2: 21 mmol/L — ABNORMAL LOW (ref 22–32)
Calcium: 9.1 mg/dL (ref 8.9–10.3)
Chloride: 100 mmol/L (ref 98–111)
Creatinine, Ser: 1.02 mg/dL (ref 0.61–1.24)
GFR, Estimated: >60 mL/min
Glucose, Bld: 244 mg/dL — ABNORMAL HIGH (ref 70–99)
Potassium: 4.8 mmol/L (ref 3.5–5.1)
Sodium: 131 mmol/L — ABNORMAL LOW (ref 135–145)
Total Bilirubin: 0.3 mg/dL (ref 0.0–1.2)
Total Protein: 6.4 g/dL — ABNORMAL LOW (ref 6.5–8.1)

## 2024-09-03 LAB — CBC WITH DIFFERENTIAL/PLATELET
Abs Immature Granulocytes: 0.06 10*3/uL (ref 0.00–0.07)
Basophils Absolute: 0.1 10*3/uL (ref 0.0–0.1)
Basophils Relative: 2 %
Eosinophils Absolute: 0.1 10*3/uL (ref 0.0–0.5)
Eosinophils Relative: 2 %
HCT: 46.2 % (ref 39.0–52.0)
Hemoglobin: 15.8 g/dL (ref 13.0–17.0)
Immature Granulocytes: 1 %
Lymphocytes Relative: 40 %
Lymphs Abs: 1.8 10*3/uL (ref 0.7–4.0)
MCH: 28.4 pg (ref 26.0–34.0)
MCHC: 34.2 g/dL (ref 30.0–36.0)
MCV: 83.1 fL (ref 80.0–100.0)
Monocytes Absolute: 0.3 10*3/uL (ref 0.1–1.0)
Monocytes Relative: 8 %
Neutro Abs: 2.1 10*3/uL (ref 1.7–7.7)
Neutrophils Relative %: 47 %
Platelets: 245 10*3/uL (ref 150–400)
RBC: 5.56 MIL/uL (ref 4.22–5.81)
RDW: 12.2 % (ref 11.5–15.5)
WBC: 4.5 10*3/uL (ref 4.0–10.5)
nRBC: 0 % (ref 0.0–0.2)

## 2024-09-03 LAB — PHOSPHORUS: Phosphorus: 3.8 mg/dL (ref 2.5–4.6)

## 2024-09-03 LAB — MAGNESIUM: Magnesium: 1.8 mg/dL (ref 1.7–2.4)

## 2024-09-03 LAB — GLUCOSE, CAPILLARY
Glucose-Capillary: 289 mg/dL — ABNORMAL HIGH (ref 70–99)
Glucose-Capillary: 374 mg/dL — ABNORMAL HIGH (ref 70–99)

## 2024-09-03 MED ORDER — AMLODIPINE BESYLATE 10 MG PO TABS
10.0000 mg | ORAL_TABLET | Freq: Every day | ORAL | 0 refills | Status: AC
  Filled 2024-09-03: qty 30, 30d supply, fill #0

## 2024-09-03 MED ORDER — LANCET DEVICE MISC
1.0000 | 0 refills | Status: AC
  Filled 2024-09-03: qty 1, fill #0

## 2024-09-03 MED ORDER — ONDANSETRON HCL 4 MG PO TABS
4.0000 mg | ORAL_TABLET | Freq: Four times a day (QID) | ORAL | 0 refills | Status: AC | PRN
  Filled 2024-09-03: qty 20, 5d supply, fill #0

## 2024-09-03 MED ORDER — INSULIN LISPRO (1 UNIT DIAL) 100 UNIT/ML (KWIKPEN)
10.0000 [IU] | PEN_INJECTOR | Freq: Three times a day (TID) | SUBCUTANEOUS | 0 refills | Status: AC
  Filled 2024-09-03: qty 15, 50d supply, fill #0

## 2024-09-03 MED ORDER — BLOOD GLUCOSE TEST VI STRP
1.0000 | ORAL_STRIP | 0 refills | Status: AC
  Filled 2024-09-03: qty 100, 25d supply, fill #0

## 2024-09-03 MED ORDER — ESCITALOPRAM OXALATE 5 MG PO TABS
5.0000 mg | ORAL_TABLET | Freq: Every day | ORAL | 0 refills | Status: AC
  Filled 2024-09-03: qty 30, 30d supply, fill #0

## 2024-09-03 MED ORDER — BLOOD GLUCOSE MONITOR SYSTEM W/DEVICE KIT
1.0000 | PACK | 0 refills | Status: AC
  Filled 2024-09-03: qty 1, 30d supply, fill #0

## 2024-09-03 MED ORDER — NICOTINE 14 MG/24HR TD PT24
14.0000 mg | MEDICATED_PATCH | Freq: Every day | TRANSDERMAL | 0 refills | Status: AC
  Filled 2024-09-03: qty 28, 28d supply, fill #0

## 2024-09-03 MED ORDER — POLYETHYLENE GLYCOL 3350 17 GM/SCOOP PO POWD
17.0000 g | Freq: Every day | ORAL | 0 refills | Status: AC | PRN
  Filled 2024-09-03: qty 238, 14d supply, fill #0

## 2024-09-03 MED ORDER — NICOTINE POLACRILEX 2 MG MT GUM
2.0000 mg | CHEWING_GUM | OROMUCOSAL | 0 refills | Status: AC | PRN
  Filled 2024-09-03: qty 110, fill #0

## 2024-09-03 MED ORDER — PEN NEEDLES 31G X 5 MM MISC
1.0000 | 0 refills | Status: AC
  Filled 2024-09-03: qty 100, 25d supply, fill #0

## 2024-09-03 MED ORDER — LANCETS MISC
1.0000 | 0 refills | Status: AC
  Filled 2024-09-03: qty 100, 25d supply, fill #0

## 2024-09-03 MED ORDER — ACETAMINOPHEN 325 MG PO TABS
650.0000 mg | ORAL_TABLET | Freq: Four times a day (QID) | ORAL | 0 refills | Status: AC | PRN
  Filled 2024-09-03: qty 20, 3d supply, fill #0

## 2024-09-03 MED ORDER — HYDRALAZINE HCL 25 MG PO TABS
25.0000 mg | ORAL_TABLET | Freq: Three times a day (TID) | ORAL | 0 refills | Status: AC
  Filled 2024-09-03: qty 90, 30d supply, fill #0

## 2024-09-03 MED ORDER — INSULIN GLARGINE 100 UNIT/ML SOLOSTAR PEN
25.0000 [IU] | PEN_INJECTOR | Freq: Every day | SUBCUTANEOUS | 0 refills | Status: AC
  Filled 2024-09-03: qty 15, 60d supply, fill #0

## 2024-09-03 MED ORDER — LISINOPRIL 5 MG PO TABS
5.0000 mg | ORAL_TABLET | Freq: Every day | ORAL | 0 refills | Status: AC
  Filled 2024-09-03: qty 30, 30d supply, fill #0

## 2024-09-03 NOTE — Progress Notes (Signed)
 PT Cancellation Note  Patient Details Name: [name] [name] MRN: [id number] DOB: [birth year]   Cancelled Treatment:    Reason Eval/Treat Not Completed: PT screened, no needs identified, will sign off Pt up and showering independently.  Spoke with RN and PT - pt independent.  No PT needs. Serenna Deroy, PT Acute Rehab [facility] [phone]   [name] [name] [place] 09/03/2024, 12:49 PM

## 2024-09-03 NOTE — Evaluation (Signed)
 Occupational Therapy Evaluation Patient Details Name: [name] [name] MRN: [id number] DOB: [birth year] Today's Date: 09/03/2024   History of Present Illness   Pt is a 39 y.o. male admitted with hyperglycemia and syncopal episode. Recent admission to [facility] for suicidal ideation. PMH significant for DM Type II, HTN, MDD, tobacco and polysubstance abuse.     Clinical Impressions Prior to admission, pt homeless and staying at local shelters. Pt hopes to be able to return to [place] where his family lives. Pt is at baseline for ADL and mobility performance, demonstrating independence in bed mobility, transfers and standing showers. Pt denies changes to vision, sensation, strength or balance. No dizziness or LOB observed during mobility and ADL performance in room, strength + balance WNL. States his syncopal episodes are related to his blood sugar. Pt has no DME or further OT needs at this time, OT will sign off. Thank you for the referral.      If plan is discharge home, recommend the following:   Assist for transportation     Functional Status Assessment   Patient has not had a recent decline in their functional status     Equipment Recommendations   None recommended by OT      Precautions/Restrictions   Precautions Precautions: None Restrictions Weight Bearing Restrictions Per Provider Order: No     Mobility Bed Mobility Overal bed mobility: Independent                  Transfers Overall transfer level: Independent Equipment used: None                      Balance Overall balance assessment: Independent                                         ADL either performed or assessed with clinical judgement   ADL Overall ADL's : At baseline;Independent                                       General ADL Comments: Pt demonstrated independence in LB dressing, mobility t/f bathroom, and walk-in showers. No balance or mobility  deficits noted. Pt states that hx of syncopal episodes are related to blood sugar. Pt verbalizes that his meds are going to be delivered to room prior to discharge.     Vision Baseline Vision/History: 0 No visual deficits Ability to See in Adequate Light: 0 Adequate Patient Visual Report: No change from baseline              Pertinent Vitals/Pain Pain Assessment Pain Assessment: No/denies pain     Extremity/Trunk Assessment Upper Extremity Assessment Upper Extremity Assessment: Overall WFL for tasks assessed;Right hand dominant   Lower Extremity Assessment Lower Extremity Assessment: Overall WFL for tasks assessed   Cervical / Trunk Assessment Cervical / Trunk Assessment: Normal   Communication Communication Communication: No apparent difficulties   Cognition Arousal: Alert Behavior During Therapy: WFL for tasks assessed/performed Cognition: No apparent impairments                               Following commands: Intact       Cueing  General Comments   Cueing Techniques: Verbal cues  Pt states he would like  to shower before leaving, provided with ADL items including shampoo, body wash, etc. Pt able to demonstrate safe showering from a standing position.           Home Living Family/patient expects to be discharged to:: Shelter/Homeless                                 Additional Comments: pt states he will be going to bus stop at discharge to [place]      Prior Functioning/Environment Prior Level of Function : Independent/Modified Independent                            OT Goals(Current goals can be found in the care plan section)   Acute Rehab OT Goals OT Goal Formulation: All assessment and education complete, DC therapy   AM-PAC OT 6 Clicks Daily Activity     Outcome Measure Help from another person eating meals?: None Help from another person taking care of personal grooming?: None Help from another person  toileting, which includes using toliet, bedpan, or urinal?: None Help from another person bathing (including washing, rinsing, drying)?: None Help from another person to put on and taking off regular upper body clothing?: None Help from another person to put on and taking off regular lower body clothing?: None 6 Click Score: 24   End of Session Nurse Communication: Mobility status;Other (comment) (pt would like to shower before d/c)  Activity Tolerance: Patient tolerated treatment well;No increased pain Patient left: in bed;with call bell/phone within reach  OT Visit Diagnosis: Other abnormalities of gait and mobility (R26.89)                Time: 8956-8945 OT Time Calculation (min): 11 min Charges:  OT General Charges $OT Visit: 1 Visit  Naraya Stoneberg L. Pharell Rolfson, OTR/L  09/03/24, 1:04 PM

## 2024-09-03 NOTE — Progress Notes (Addendum)
 Discharge medications delivered to patient at the bedside in a secure bag. Discharge instructions given to patient questions asked adnd answered

## 2024-09-03 NOTE — TOC Transition Note (Signed)
 Transition of Care [facility]) - Discharge Note   Patient Details  Name: [name] [name] MRN: [id number] Date of Birth: Jun 27, 1986  Transition [facility]) CM/SW Contact:  NORMAN ASPEN, LCSW Phone Number: 09/03/2024, 11:18 AM   Clinical Narrative:     Pt medically cleared for dc today.  Have added SA resources to AVS as well as provided handouts on local food, housing and transportation resources.  Bus pass provided to RN to give to patient.  Noted on dc instructions that referral made to [facility] for new patient/ PCP appointment - clinic to contact patient directly with appt info but have encouraged pt to call clinic if he has not received a call be early next week.  DC meds are being filled by [facility].  No further IP CM needs.  Final next level of care: Homeless Shelter Barriers to Discharge: Barriers Resolved   Patient Goals and CMS Choice Patient states their goals for this hospitalization and ongoing recovery are:: ultimately, pt would like to return to his home in [place].          Discharge Placement                       Discharge Plan and Services Additional resources added to the After Visit Summary for   In-house Referral: Clinical Social Work Discharge Planning Services: Indigent Health Clinic                                 Social Drivers of Health (SDOH) Interventions SDOH Screenings   Food Insecurity: Food Insecurity Present (09/01/2024)  Housing: High Risk (09/01/2024)  Transportation Needs: Unmet Transportation Needs (09/01/2024)  Utilities: Not At Risk (09/01/2024)  Alcohol Screen: Low Risk (08/20/2024)  Depression (PHQ2-9): High Risk (08/20/2024)  Tobacco Use: High Risk (09/01/2024)     Readmission Risk Interventions    09/30/2023   11:37 AM 07/21/2023   12:30 PM 07/04/2023   12:49 PM  Readmission Risk Prevention Plan  Transportation Screening Complete Complete Complete  Medication Review Oceanographer) Complete Complete  Complete  PCP or Specialist appointment within 3-5 days of discharge  Complete Complete  HRI or Home Care Consult Complete Complete Complete  HRI or Home Care Consult Pt Refusal Comments  N/A   SW Recovery Care/Counseling Consult Complete Complete Complete  Palliative Care Screening Not Applicable Not Applicable Not Applicable  Skilled Nursing Facility Not Applicable Not Applicable Not Applicable

## 2024-09-03 NOTE — Discharge Summary (Incomplete)
 " Physician Discharge Summary   Patient: [name] [name] MRN: [id number] DOB: Oct 19, 1985  Admit date:     09/01/2024  Discharge date: 09/03/24  Discharge Physician: Alejandro Marker, DO   PCP: Patient, No Pcp Per   Recommendations at discharge:  {Tip this will not be part of the note when signed- Example include specific recommendations for outpatient follow-up, pending tests to follow-up on. (Optional):26781}  ***  Discharge Diagnoses: Principal Problem:   Hyperosmolar hyperglycemic coma due to diabetes mellitus without ketoacidosis (HCC)  Resolved Problems:   * No resolved hospital problems. *  Hospital Course: [name] [name] is a 39 y.o. male with medical history significant of DM-2, HTN, depression who presented with complaints of high blood sugar and reportedly syncopal episode.   Patient apparently was recently admitted for [facility] was discharged approximately 5-6 days ago-since his discharge he has been noncompliant with his medications.  Earlier yesterday evening-he was at [facility] felt woozy and sustained a syncopal episode.    He was admitted and placed on insulin  drip and admitted to the hospital and subsequently was weaned and transition.  Sugars are improving and he was weaned off of the insulin  drip to subcu insulin  which is being adjusted.  Working up for his syncopal episode and awaiting PT OT evaluation but anticipate discharge in the next 24 to 4 hours if he does well.  Assessment and Plan:  Hyperglycemic hyperosmolar nonketotic state: Last A1c> 15.5 on 02/28/2024 and now it is 13.7. Sugars remained persistently elevated in the ED in spite of IV fluids and SQ insulin  Start IV insulin /IV fluids per hypoglycemic protocol; Transitioned to sq Insulin . Now on insulin  glargine 25 units SQ every 24, sensitive NovoLog /scale insulin  AC and increasing 6 units of insulin  aspart subcu 3 times daily with meals to 8 units -Diabetes education coordinator consulted and appreciate further  evaluation recommendations.  If blood sugars remain stable likely can be discharged in next 24 hours. CBG Trend:  Recent Labs  Lab 09/01/24 1831 09/01/24 2052 09/02/24 0735 09/02/24 1147 09/02/24 1617 09/02/24 2144 09/03/24 0717  GLUCAP 162* 231* 222* 408* 247* 145* 289*    Syncope: Probably secondary to orthostatic hypotension in the setting of osmotic diuresis due to above. C/w Telemetry Monitoring. Check Orthostatic VS. ECHO done and showed LVEF of 55 to 60% w/ no RWMA. Patient had mild concentric LVH and LVDP Normal. Awaiting PT/OT to eval  Urinary Retention: Catheter removed yesterday. Check Bladder Scan   AKI / Metabolic Acidosis: Probably likely secondary to osmotic diuresis in the setting of hyperglycemia Treat underlying hyperglycemia-IV fluids. BUN/Cr Trend: Recent Labs  Lab 08/20/24 0300 08/21/24 2335 09/01/24 0310 09/01/24 0847 09/01/24 1208 09/02/24 0513 09/03/24 0545  BUN 11 14 17 15 14 13 19   CREATININE 0.98 1.10 1.38* 1.26* 1.09 1.03 1.02  -Has a mild MA w/ a CO2 of 21, AG of 11, Chloride Lvl of 101 -C/w NS @ 100 mL/hr -Avoid Nephrotoxic Medications, Contrast Dyes, Hypotension and Dehydration to Ensure Adequate Renal Perfusion and will need to Renally Adjust Meds -Continue to Monitor and Trend Renal Function carefully and repeat CMP in the AM    Essential HTN: BP uncontrolled-resume amlodipine  and hydralazine  Enalapril held due to AKI. CTM BP per Protocol. Last BP reading was 141/84   Tobacco abuse: C/w Transdermal nicotine . Counseling given   Polysubstance abuse: Acknowledges using amphetamines last week   Noncompliance to medications: Counseled   Homelessness: TOC evaluation prior to discharge  Depression and Anxiety: C/w Escitalopram  5 mg  po Daily; recently discharged from [facility] on 08/25/2024  Hypoalbuminemia:Patient's Albumin Trend:  Recent Labs  Lab 08/05/24 0110 08/11/24 1838 08/17/24 2223 08/18/24 2307 08/20/24 0300 09/02/24 0513  09/03/24 0545  ALBUMIN 3.9 3.7 4.1 3.7 3.5 3.4* 3.5  -Continue to Monitor and Trend and repeat CMP in the AM  Class I Obesity: Complicates overall prognosis and care. Estimated body mass index is 30.68 kg/m as calculated from the following:   Height as of this encounter: 5' 11 (1.803 m).   Weight as of this encounter: 99.8 kg. Weight Loss and Dietary Counseling given   Assessment and Plan: No notes have been filed under this hospital service. Service: Hospitalist     {Tip this will not be part of the note when signed Body mass index is 30.68 kg/m. , ,  (Optional):26781}  {(NOTE) Pain control PDMP Statment (Optional):26782} Consultants: *** Procedures performed: ***  Disposition: {Plan; Disposition:26390} Diet recommendation:  Discharge Diet Orders (From admission, onward)     Start     Ordered   09/03/24 0000  Diet Carb Modified        09/03/24 1001   09/03/24 0000  Diet - low sodium heart healthy        09/03/24 1001           {Diet_Plan:26776} DISCHARGE MEDICATION: Allergies as of 09/03/2024   No Known Allergies      Medication List     STOP taking these medications    insulin  aspart 100 UNIT/ML injection Commonly known as: novoLOG  Replaced by: insulin  aspart 100 UNIT/ML FlexPen   insulin  glargine 100 UNIT/ML injection Commonly known as: LANTUS  Replaced by: insulin  glargine 100 UNIT/ML Solostar Pen       TAKE these medications    acetaminophen  325 MG tablet Commonly known as: TYLENOL  Take 2 tablets (650 mg total) by mouth every 6 (six) hours as needed for mild pain (pain score 1-3) or fever (or Fever >/= 101).   amLODipine  10 MG tablet Commonly known as: NORVASC  Take 1 tablet (10 mg total) by mouth daily.   Blood Glucose Monitoring Suppl Devi 1 each by Does not apply route as directed. Dispense based on patient and insurance preference. Use up to four times daily as directed. (FOR ICD-10 E10.9, E11.9).   BLOOD GLUCOSE TEST STRIPS Strp 1  each by Does not apply route as directed. Dispense based on patient and insurance preference. Use up to four times daily as directed. (FOR ICD-10 E10.9, E11.9).   escitalopram  5 MG tablet Commonly known as: LEXAPRO  Take 1 tablet (5 mg total) by mouth daily.   hydrALAZINE  25 MG tablet Commonly known as: APRESOLINE  Take 1 tablet (25 mg total) by mouth every 8 (eight) hours.   insulin  aspart 100 UNIT/ML FlexPen Commonly known as: NOVOLOG  Inject 10 Units into the skin 3 (three) times daily with meals. Only take if eating a meal AND Blood Glucose (BG) is 80 or higher. Replaces: insulin  aspart 100 UNIT/ML injection   insulin  glargine 100 UNIT/ML Solostar Pen Commonly known as: LANTUS  Inject 25 Units into the skin daily. May substitute as needed per insurance. Replaces: insulin  glargine 100 UNIT/ML injection   Lancet Device Misc Use 3 (three) times daily. What changed: Another medication with the same name was added. Make sure you understand how and when to take each.   Lancet Device Misc 1 each by Does not apply route as directed. Dispense based on patient and insurance preference. Use up to four times daily as directed. (FOR ICD-10  E10.9, E11.9). What changed: You were already taking a medication with the same name, and this prescription was added. Make sure you understand how and when to take each.   Lancets Misc 1 each by Does not apply route as directed. Dispense based on patient and insurance preference. Use up to four times daily as directed. (FOR ICD-10 E10.9, E11.9).   lisinopril  5 MG tablet Commonly known as: ZESTRIL  Take 1 tablet (5 mg total) by mouth daily.   nicotine  14 mg/24hr patch Commonly known as: NICODERM CQ  - dosed in mg/24 hours Place 1 patch (14 mg total) onto the skin daily. Start taking on: September 04, 2024   nicotine  polacrilex 2 MG gum Commonly known as: NICORETTE  Take 1 each (2 mg total) by mouth as needed for smoking cessation.   ondansetron  4 MG  tablet Commonly known as: ZOFRAN  Take 1 tablet (4 mg total) by mouth every 6 (six) hours as needed for nausea.   Pen Needles 31G X 5 MM Misc 1 each by Does not apply route as directed. Dispense based on patient and insurance preference. Use up to four times daily as directed. (FOR ICD-10 E10.9, E11.9).   polyethylene glycol 17 g packet Commonly known as: MIRALAX  / GLYCOLAX  Take 17 g by mouth daily as needed for mild constipation.        Discharge Exam: Filed Weights   09/01/24 0304  Weight: 99.8 kg   ***  Condition at discharge: {DC Condition:26389}  The results of significant diagnostics from this hospitalization (including imaging, microbiology, ancillary and laboratory) are listed below for reference.   Imaging Studies: ECHOCARDIOGRAM COMPLETE Result Date: 09/01/2024    ECHOCARDIOGRAM REPORT   Patient Name:   [name] [name] Date of Exam: 09/01/2024 Medical Rec #:  [id number]      Height:       70.9 in Accession #:    [id number]     Weight:       220.0 lb Date of Birth:  09-26-1985      BSA:          2.193 m Patient Age:    38 years       BP:           143/103 mmHg Patient Gender: M              HR:           88 bpm. Exam Location:  Inpatient Procedure: 2D Echo, Cardiac Doppler, Color Doppler and Strain Analysis (Both            Spectral and Color Flow Doppler were utilized during procedure). Indications:    Syncope  History:        Patient has prior history of Echocardiogram examinations, most                 recent 09/30/2023.  Sonographer:    Odella Brewster Referring Phys: [phone] DONALDA HERO [facility]  Sonographer Comments: Image acquisition challenging due to respiratory motion. IMPRESSIONS  1. Left ventricular ejection fraction, by estimation, is 55 to 60%. Left ventricular ejection fraction by 3D volume is 58 %. The left ventricle has normal function. The left ventricle has no regional wall motion abnormalities. There is mild concentric left ventricular hypertrophy. Left ventricular diastolic  parameters were normal.  2. Right ventricular systolic function is normal. The right ventricular size is normal.  3. The mitral valve is normal in structure. No evidence of mitral valve regurgitation. No evidence of mitral stenosis.  4.  The aortic valve is normal in structure. Aortic valve regurgitation is not visualized. No aortic stenosis is present.  5. The inferior vena cava is normal in size with greater than 50% respiratory variability, suggesting right atrial pressure of 3 mmHg. FINDINGS  Left Ventricle: Left ventricular ejection fraction, by estimation, is 55 to 60%. Left ventricular ejection fraction by 3D volume is 58 %. The left ventricle has normal function. The left ventricle has no regional wall motion abnormalities. The left ventricular internal cavity size was normal in size. There is mild concentric left ventricular hypertrophy. Left ventricular diastolic parameters were normal. Right Ventricle: The right ventricular size is normal. No increase in right ventricular wall thickness. Right ventricular systolic function is normal. Left Atrium: Left atrial size was normal in size. Right Atrium: Right atrial size was normal in size. Pericardium: Trivial pericardial effusion is present. Mitral Valve: The mitral valve is normal in structure. No evidence of mitral valve regurgitation. No evidence of mitral valve stenosis. MV peak gradient, 3.2 mmHg. The mean mitral valve gradient is 2.0 mmHg. Tricuspid Valve: The tricuspid valve is normal in structure. Tricuspid valve regurgitation is not demonstrated. No evidence of tricuspid stenosis. Aortic Valve: The aortic valve is normal in structure. Aortic valve regurgitation is not visualized. No aortic stenosis is present. Aortic valve mean gradient measures 3.0 mmHg. Aortic valve peak gradient measures 5.9 mmHg. Aortic valve area, by VTI measures 3.53 cm. Pulmonic Valve: The pulmonic valve was normal in structure. Pulmonic valve regurgitation is not visualized.  No evidence of pulmonic stenosis. Aorta: The aortic root is normal in size and structure. Venous: The inferior vena cava is normal in size with greater than 50% respiratory variability, suggesting right atrial pressure of 3 mmHg. IAS/Shunts: No atrial level shunt detected by color flow Doppler. Additional Comments: 3D was performed not requiring image post processing on an independent workstation and was normal.  LEFT VENTRICLE PLAX 2D LVIDd:         5.00 cm         Diastology LVIDs:         3.60 cm         LV e' medial:    7.83 cm/s LV PW:         1.30 cm         LV E/e' medial:  9.4 LV IVS:        1.00 cm         LV e' lateral:   9.68 cm/s LVOT diam:     2.30 cm         LV E/e' lateral: 7.6 LV SV:         69 LV SV Index:   31 LVOT Area:     4.15 cm        3D Volume EF LV IVRT:       127 msec        LV 3D EF:    Left                                             ventricul                                             ar LV Volumes (MOD)  ejection LV vol d, MOD    146.0 ml                   fraction A2C:                                        by 3D LV vol d, MOD    114.0 ml                   volume is A4C:                                        58 %. LV vol s, MOD    45.4 ml A2C: LV vol s, MOD    45.4 ml       3D Volume EF: A4C:                           3D EF:        58 % LV SV MOD A2C:   100.6 ml      LV EDV:       190 ml LV SV MOD A4C:   114.0 ml      LV ESV:       80 ml LV SV MOD BP:    85.0 ml       LV SV:        110 ml RIGHT VENTRICLE             IVC RV S prime:     18.90 cm/s  IVC diam: 1.60 cm TAPSE (M-mode): 2.3 cm                             PULMONARY VEINS                             Diastolic Velocity: 34.00 cm/s                             S/D Velocity:       1.60                             Systolic Velocity:  53.30 cm/s LEFT ATRIUM             Index        RIGHT ATRIUM           Index LA diam:        2.50 cm 1.14 cm/m   RA Area:     14.10 cm LA Vol (A2C):   59.3 ml 27.04 ml/m   RA Volume:   34.20 ml  15.59 ml/m LA Vol (A4C):   42.4 ml 19.33 ml/m LA Biplane Vol: 52.6 ml 23.98 ml/m  AORTIC VALVE                    PULMONIC VALVE AV Area (Vmax):    3.25 cm     PV Vmax:          1.02 m/s AV Area (Vmean):   3.15 cm     PV  Peak grad:     4.2 mmHg AV Area (VTI):     3.53 cm     PR End Diast Vel: 4.58 msec AV Vmax:           121.00 cm/s AV Vmean:          87.900 cm/s AV VTI:            0.194 m AV Peak Grad:      5.9 mmHg AV Mean Grad:      3.0 mmHg LVOT Vmax:         94.60 cm/s LVOT Vmean:        66.700 cm/s LVOT VTI:          0.165 m LVOT/AV VTI ratio: 0.85  AORTA Ao Root diam: 4.10 cm Ao Asc diam:  3.20 cm MITRAL VALVE               TRICUSPID VALVE MV Area (PHT): 3.93 cm    TR Peak grad:   19.5 mmHg MV Area VTI:   3.34 cm    TR Vmax:        221.00 cm/s MV Peak grad:  3.2 mmHg MV Mean grad:  2.0 mmHg    SHUNTS MV Vmax:       0.89 m/s    Systemic VTI:  0.16 m MV Vmean:      63.5 cm/s   Systemic Diam: 2.30 cm MV E velocity: 73.70 cm/s MV A velocity: 73.30 cm/s MV E/A ratio:  1.01 Kardie Tobb DO Electronically signed by Dub Huntsman DO Signature Date/Time: 09/01/2024/2:26:17 PM    Final    DG Chest Portable 1 View Result Date: 09/01/2024 EXAM: 1 VIEW XRAY OF THE CHEST 09/01/2024 03:30:00 AM COMPARISON: 07/16/2024 CLINICAL HISTORY: Fall. FINDINGS: LUNGS AND PLEURA: No focal pulmonary opacity. No pleural effusion. No pneumothorax. HEART AND MEDIASTINUM: Overlapping cardiac leads in place. No acute abnormality of the cardiac and mediastinal silhouettes. BONES AND SOFT TISSUES: No acute osseous abnormality. IMPRESSION: 1. No acute process. Electronically signed by: Greig Pique MD 09/01/2024 03:39 AM EST RP Workstation: [id number]   DG Hand Complete Right Result Date: 09/01/2024 EXAM: 2 VIEW(S) XRAY OF THE RIGHT HAND 09/01/2024 03:28:00 AM COMPARISON: right hand x-ray 02/15/2024. CLINICAL HISTORY: Fall; pain. FINDINGS: BONES AND JOINTS: No acute fracture. No malalignment. SOFT TISSUES:  Unremarkable. IMPRESSION: 1. No acute fracture or dislocation. Electronically signed by: Greig Pique MD 09/01/2024 03:38 AM EST RP Workstation: [id number]   DG Ankle Complete Right Result Date: 08/18/2024 EXAM: 3 OR MORE VIEW(S) XRAY OF THE RIGHT ANKLE 08/18/2024 11:00:29 PM CLINICAL HISTORY: pain pain pain COMPARISON: None available. FINDINGS: BONES AND JOINTS: No acute fracture. No malalignment. SOFT TISSUES: There is mild generalized soft tissue swelling. IMPRESSION: 1. Soft tissue swelling without evidence of fractures. Electronically signed by: Francis Quam MD 08/18/2024 11:07 PM EST RP Workstation: [id number]    Microbiology: Results for orders placed or performed during the hospital encounter of 07/16/24  Resp panel by RT-PCR (RSV, Flu A&B, Covid) Anterior Nasal Swab     Status: None   Collection Time: 07/16/24  2:39 AM   Specimen: Anterior Nasal Swab  Result Value Ref Range Status   SARS Coronavirus 2 by RT PCR NEGATIVE NEGATIVE Final   Influenza A by PCR NEGATIVE NEGATIVE Final   Influenza B by PCR NEGATIVE NEGATIVE Final    Comment: (NOTE) The Xpert Xpress SARS-CoV-2/FLU/RSV plus assay is intended as an aid in the diagnosis of influenza from Nasopharyngeal swab specimens and  should not be used as a sole basis for treatment. Nasal washings and aspirates are unacceptable for Xpert Xpress SARS-CoV-2/FLU/RSV testing.  Fact Sheet for Patients: bloggercourse.com  Fact Sheet for Healthcare Providers: seriousbroker.it  This test is not yet approved or cleared by the [place]  FDA and has been authorized for detection and/or diagnosis of SARS-CoV-2 by FDA under an Emergency Use Authorization (EUA). This EUA will remain in effect (meaning this test can be used) for the duration of the COVID-19 declaration under Section 564(b)(1) of the Act, 21 U.S.C. section 360bbb-3(b)(1), unless the authorization is terminated or revoked.      Resp Syncytial Virus by PCR NEGATIVE NEGATIVE Final    Comment: (NOTE) Fact Sheet for Patients: bloggercourse.com  Fact Sheet for Healthcare Providers: seriousbroker.it  This test is not yet approved or cleared by the [place]  FDA and has been authorized for detection and/or diagnosis of SARS-CoV-2 by FDA under an Emergency Use Authorization (EUA). This EUA will remain in effect (meaning this test can be used) for the duration of the COVID-19 declaration under Section 564(b)(1) of the Act, 21 U.S.C. section 360bbb-3(b)(1), unless the authorization is terminated or revoked.  Performed at [facility], [address] [place], [place] [address]     Labs: CBC: Recent Labs  Lab 09/01/24 0310 09/01/24 0847 09/02/24 0513 09/03/24 0545  WBC 5.2 5.4 5.4 4.5  NEUTROABS 3.0  --   --  2.1  HGB 14.1 13.8 15.6 15.8  HCT 42.1 42.2 46.6 46.2  MCV 85.4 85.6 84.6 83.1  PLT 266 271 254 245   Basic Metabolic Panel: Recent Labs  Lab 09/01/24 0310 09/01/24 0847 09/01/24 1208 09/02/24 0513 09/03/24 0545  NA 125* 131* 132* 133* 131*  K 5.0 4.5 4.2 4.4 4.8  CL 91* 98 101 101 100  CO2 23 24 17* 21* 21*  GLUCOSE 754* 459* 272* 231* 244*  BUN 17 15 14 13 19   CREATININE 1.38* 1.26* 1.09 1.03 1.02  CALCIUM  8.6* 9.2 8.8* 9.2 9.1  MG 2.2  --   --  2.1 1.8  PHOS  --   --   --  3.5 3.8   Liver Function Tests: Recent Labs  Lab 09/02/24 0513 09/03/24 0545  AST 30 20  ALT 15 12  ALKPHOS 78 74  BILITOT 0.6 0.3  PROT 6.3* 6.4*  ALBUMIN 3.4* 3.5   CBG: Recent Labs  Lab 09/02/24 0735 09/02/24 1147 09/02/24 1617 09/02/24 2144 09/03/24 0717  GLUCAP 222* 408* 247* 145* 289*    Discharge time spent: {LESS THAN/GREATER UYJW:73611} 30 minutes.  Signed: Alejandro Lazarus Marker, DO [facility] 09/03/2024 "
